# Patient Record
Sex: Female | Born: 1940 | Race: White | Hispanic: No | Marital: Married | Smoking: Never smoker
Health system: Southern US, Community
[De-identification: ages and names within clinical notes are randomized; demographics above are authoritative.]

## PROBLEM LIST (undated history)

## (undated) DIAGNOSIS — G8929 Other chronic pain: Secondary | ICD-10-CM

## (undated) DIAGNOSIS — M549 Dorsalgia, unspecified: Secondary | ICD-10-CM

## (undated) DIAGNOSIS — E119 Type 2 diabetes mellitus without complications: Secondary | ICD-10-CM

## (undated) DIAGNOSIS — D649 Anemia, unspecified: Secondary | ICD-10-CM

## (undated) DIAGNOSIS — R569 Unspecified convulsions: Secondary | ICD-10-CM

## (undated) DIAGNOSIS — E7800 Pure hypercholesterolemia, unspecified: Secondary | ICD-10-CM

## (undated) DIAGNOSIS — I10 Essential (primary) hypertension: Secondary | ICD-10-CM

## (undated) DIAGNOSIS — M1990 Unspecified osteoarthritis, unspecified site: Secondary | ICD-10-CM

## (undated) DIAGNOSIS — K219 Gastro-esophageal reflux disease without esophagitis: Secondary | ICD-10-CM

## (undated) DIAGNOSIS — N183 Chronic kidney disease, stage 3 unspecified: Secondary | ICD-10-CM

## (undated) DIAGNOSIS — F32A Depression, unspecified: Secondary | ICD-10-CM

## (undated) DIAGNOSIS — E785 Hyperlipidemia, unspecified: Secondary | ICD-10-CM

## (undated) DIAGNOSIS — F329 Major depressive disorder, single episode, unspecified: Secondary | ICD-10-CM

## (undated) DIAGNOSIS — I639 Cerebral infarction, unspecified: Secondary | ICD-10-CM

## (undated) HISTORY — DX: Essential (primary) hypertension: I10

## (undated) HISTORY — DX: Unspecified osteoarthritis, unspecified site: M19.90

## (undated) HISTORY — DX: Hyperlipidemia, unspecified: E78.5

## (undated) HISTORY — DX: Cerebral infarction, unspecified: I63.9

## (undated) HISTORY — PX: COLONOSCOPY: SHX174

## (undated) HISTORY — DX: Unspecified convulsions: R56.9

## (undated) HISTORY — PX: DILATION AND CURETTAGE OF UTERUS: SHX78

## (undated) HISTORY — PX: CATARACT EXTRACTION W/ INTRAOCULAR LENS IMPLANT: SHX1309

---

## 1998-12-05 ENCOUNTER — Ambulatory Visit (HOSPITAL_COMMUNITY): Admission: RE | Admit: 1998-12-05 | Discharge: 1998-12-05 | Payer: Self-pay | Admitting: Family Medicine

## 2000-04-03 ENCOUNTER — Other Ambulatory Visit: Admission: RE | Admit: 2000-04-03 | Discharge: 2000-04-03 | Payer: Self-pay | Admitting: *Deleted

## 2000-06-26 ENCOUNTER — Encounter: Admission: RE | Admit: 2000-06-26 | Discharge: 2000-06-26 | Payer: Self-pay | Admitting: Family Medicine

## 2000-06-26 ENCOUNTER — Encounter: Payer: Self-pay | Admitting: Family Medicine

## 2001-02-26 ENCOUNTER — Encounter: Admission: RE | Admit: 2001-02-26 | Discharge: 2001-02-26 | Payer: Self-pay | Admitting: Family Medicine

## 2001-02-26 ENCOUNTER — Encounter: Payer: Self-pay | Admitting: Family Medicine

## 2001-09-13 ENCOUNTER — Encounter: Payer: Self-pay | Admitting: Family Medicine

## 2001-09-13 ENCOUNTER — Encounter: Admission: RE | Admit: 2001-09-13 | Discharge: 2001-09-13 | Payer: Self-pay | Admitting: Family Medicine

## 2002-01-14 ENCOUNTER — Encounter: Admission: RE | Admit: 2002-01-14 | Discharge: 2002-01-14 | Payer: Self-pay | Admitting: Family Medicine

## 2002-01-14 ENCOUNTER — Encounter: Payer: Self-pay | Admitting: Family Medicine

## 2002-07-28 ENCOUNTER — Other Ambulatory Visit: Admission: RE | Admit: 2002-07-28 | Discharge: 2002-07-28 | Payer: Self-pay | Admitting: Family Medicine

## 2002-12-09 ENCOUNTER — Encounter: Admission: RE | Admit: 2002-12-09 | Discharge: 2002-12-09 | Payer: Self-pay | Admitting: Family Medicine

## 2002-12-09 ENCOUNTER — Encounter: Payer: Self-pay | Admitting: Family Medicine

## 2003-11-20 ENCOUNTER — Other Ambulatory Visit: Admission: RE | Admit: 2003-11-20 | Discharge: 2003-11-20 | Payer: Self-pay | Admitting: Family Medicine

## 2004-01-12 ENCOUNTER — Encounter: Admission: RE | Admit: 2004-01-12 | Discharge: 2004-01-12 | Payer: Self-pay | Admitting: Family Medicine

## 2004-12-26 ENCOUNTER — Other Ambulatory Visit: Admission: RE | Admit: 2004-12-26 | Discharge: 2004-12-26 | Payer: Self-pay | Admitting: Family Medicine

## 2005-02-06 ENCOUNTER — Encounter: Admission: RE | Admit: 2005-02-06 | Discharge: 2005-02-06 | Payer: Self-pay | Admitting: Family Medicine

## 2006-02-12 ENCOUNTER — Encounter: Admission: RE | Admit: 2006-02-12 | Discharge: 2006-02-12 | Payer: Self-pay | Admitting: Family Medicine

## 2006-03-10 ENCOUNTER — Encounter: Admission: RE | Admit: 2006-03-10 | Discharge: 2006-03-10 | Payer: Self-pay | Admitting: Family Medicine

## 2006-03-24 ENCOUNTER — Other Ambulatory Visit: Admission: RE | Admit: 2006-03-24 | Discharge: 2006-03-24 | Payer: Self-pay | Admitting: Family Medicine

## 2007-04-16 ENCOUNTER — Encounter: Admission: RE | Admit: 2007-04-16 | Discharge: 2007-04-16 | Payer: Self-pay | Admitting: Family Medicine

## 2007-05-06 ENCOUNTER — Encounter: Admission: RE | Admit: 2007-05-06 | Discharge: 2007-05-06 | Payer: Self-pay | Admitting: Family Medicine

## 2007-05-11 ENCOUNTER — Other Ambulatory Visit: Admission: RE | Admit: 2007-05-11 | Discharge: 2007-05-11 | Payer: Self-pay | Admitting: Family Medicine

## 2007-05-13 ENCOUNTER — Encounter: Admission: RE | Admit: 2007-05-13 | Discharge: 2007-05-13 | Payer: Self-pay | Admitting: Family Medicine

## 2007-06-11 ENCOUNTER — Ambulatory Visit: Payer: Self-pay | Admitting: Internal Medicine

## 2007-07-06 ENCOUNTER — Ambulatory Visit: Payer: Self-pay | Admitting: Internal Medicine

## 2008-01-11 ENCOUNTER — Encounter: Admission: RE | Admit: 2008-01-11 | Discharge: 2008-01-11 | Payer: Self-pay | Admitting: Family Medicine

## 2008-05-08 ENCOUNTER — Encounter: Admission: RE | Admit: 2008-05-08 | Discharge: 2008-05-08 | Payer: Self-pay | Admitting: Family Medicine

## 2008-05-11 ENCOUNTER — Other Ambulatory Visit: Admission: RE | Admit: 2008-05-11 | Discharge: 2008-05-11 | Payer: Self-pay | Admitting: Family Medicine

## 2010-01-25 ENCOUNTER — Encounter: Admission: RE | Admit: 2010-01-25 | Discharge: 2010-01-25 | Payer: Self-pay | Admitting: Family Medicine

## 2010-11-24 ENCOUNTER — Encounter: Payer: Self-pay | Admitting: Family Medicine

## 2011-03-03 ENCOUNTER — Other Ambulatory Visit: Payer: Self-pay | Admitting: Family Medicine

## 2011-03-03 DIAGNOSIS — Z1231 Encounter for screening mammogram for malignant neoplasm of breast: Secondary | ICD-10-CM

## 2011-03-11 ENCOUNTER — Ambulatory Visit
Admission: RE | Admit: 2011-03-11 | Discharge: 2011-03-11 | Disposition: A | Discharge status: Home / Self Care | Payer: Federal, State, Local not specified - PPO | Source: Ambulatory Visit | Attending: Family Medicine | Admitting: Family Medicine

## 2011-03-11 DIAGNOSIS — Z1231 Encounter for screening mammogram for malignant neoplasm of breast: Secondary | ICD-10-CM

## 2011-03-12 ENCOUNTER — Other Ambulatory Visit: Payer: Self-pay | Admitting: Family Medicine

## 2011-03-12 DIAGNOSIS — R928 Other abnormal and inconclusive findings on diagnostic imaging of breast: Secondary | ICD-10-CM

## 2011-03-24 ENCOUNTER — Ambulatory Visit
Admission: RE | Admit: 2011-03-24 | Discharge: 2011-03-24 | Disposition: A | Discharge status: Home / Self Care | Payer: Federal, State, Local not specified - PPO | Source: Ambulatory Visit | Attending: Family Medicine | Admitting: Family Medicine

## 2011-03-24 DIAGNOSIS — R928 Other abnormal and inconclusive findings on diagnostic imaging of breast: Secondary | ICD-10-CM

## 2011-09-02 ENCOUNTER — Other Ambulatory Visit: Payer: Self-pay | Admitting: Family Medicine

## 2011-09-02 DIAGNOSIS — N6489 Other specified disorders of breast: Secondary | ICD-10-CM

## 2011-09-15 ENCOUNTER — Inpatient Hospital Stay: Admission: RE | Admit: 2011-09-15 | Payer: Federal, State, Local not specified - PPO | Source: Ambulatory Visit

## 2011-09-26 ENCOUNTER — Ambulatory Visit
Admission: RE | Admit: 2011-09-26 | Discharge: 2011-09-26 | Disposition: A | Discharge status: Home / Self Care | Payer: Federal, State, Local not specified - PPO | Source: Ambulatory Visit | Attending: Family Medicine | Admitting: Family Medicine

## 2011-09-26 DIAGNOSIS — N6489 Other specified disorders of breast: Secondary | ICD-10-CM

## 2011-11-04 DIAGNOSIS — I639 Cerebral infarction, unspecified: Secondary | ICD-10-CM

## 2011-11-04 HISTORY — DX: Cerebral infarction, unspecified: I63.9

## 2012-03-30 ENCOUNTER — Other Ambulatory Visit: Payer: Self-pay | Admitting: Family Medicine

## 2012-03-30 DIAGNOSIS — Z1231 Encounter for screening mammogram for malignant neoplasm of breast: Secondary | ICD-10-CM

## 2012-04-19 ENCOUNTER — Ambulatory Visit
Admission: RE | Admit: 2012-04-19 | Discharge: 2012-04-19 | Disposition: A | Discharge status: Home / Self Care | Payer: Federal, State, Local not specified - PPO | Source: Ambulatory Visit | Attending: Family Medicine | Admitting: Family Medicine

## 2012-04-19 DIAGNOSIS — Z1231 Encounter for screening mammogram for malignant neoplasm of breast: Secondary | ICD-10-CM

## 2012-07-26 ENCOUNTER — Encounter (HOSPITAL_COMMUNITY): Payer: Self-pay | Admitting: Emergency Medicine

## 2012-07-26 ENCOUNTER — Emergency Department (HOSPITAL_COMMUNITY): Payer: Medicare Other

## 2012-07-26 ENCOUNTER — Observation Stay (HOSPITAL_COMMUNITY)
Admission: EM | Admit: 2012-07-26 | Discharge: 2012-07-28 | DRG: 639 | Disposition: A | Discharge status: Home / Self Care | Payer: Medicare Other | Attending: Internal Medicine | Admitting: Internal Medicine

## 2012-07-26 DIAGNOSIS — E161 Other hypoglycemia: Secondary | ICD-10-CM

## 2012-07-26 DIAGNOSIS — E118 Type 2 diabetes mellitus with unspecified complications: Secondary | ICD-10-CM | POA: Diagnosis present

## 2012-07-26 DIAGNOSIS — T383X5A Adverse effect of insulin and oral hypoglycemic [antidiabetic] drugs, initial encounter: Secondary | ICD-10-CM | POA: Diagnosis present

## 2012-07-26 DIAGNOSIS — K219 Gastro-esophageal reflux disease without esophagitis: Secondary | ICD-10-CM | POA: Diagnosis present

## 2012-07-26 DIAGNOSIS — E119 Type 2 diabetes mellitus without complications: Secondary | ICD-10-CM

## 2012-07-26 DIAGNOSIS — Z794 Long term (current) use of insulin: Secondary | ICD-10-CM | POA: Insufficient documentation

## 2012-07-26 DIAGNOSIS — E876 Hypokalemia: Secondary | ICD-10-CM | POA: Diagnosis present

## 2012-07-26 DIAGNOSIS — R55 Syncope and collapse: Secondary | ICD-10-CM | POA: Insufficient documentation

## 2012-07-26 DIAGNOSIS — E1169 Type 2 diabetes mellitus with other specified complication: Principal | ICD-10-CM | POA: Insufficient documentation

## 2012-07-26 DIAGNOSIS — E160 Drug-induced hypoglycemia without coma: Secondary | ICD-10-CM | POA: Diagnosis present

## 2012-07-26 DIAGNOSIS — Y92009 Unspecified place in unspecified non-institutional (private) residence as the place of occurrence of the external cause: Secondary | ICD-10-CM | POA: Insufficient documentation

## 2012-07-26 HISTORY — DX: Gastro-esophageal reflux disease without esophagitis: K21.9

## 2012-07-26 LAB — GLUCOSE, CAPILLARY
Glucose-Capillary: 180 mg/dL — ABNORMAL HIGH (ref 70–99)
Glucose-Capillary: 207 mg/dL — ABNORMAL HIGH (ref 70–99)
Glucose-Capillary: 228 mg/dL — ABNORMAL HIGH (ref 70–99)
Glucose-Capillary: 215 mg/dL — ABNORMAL HIGH (ref 70–99)
Glucose-Capillary: 200 mg/dL — ABNORMAL HIGH (ref 70–99)
Glucose-Capillary: 218 mg/dL — ABNORMAL HIGH (ref 70–99)

## 2012-07-26 LAB — TROPONIN I: Troponin I: <0.3 ng/mL (ref <0.30)

## 2012-07-26 LAB — CBC
MCH: 31.4 pg (ref 26.0–34.0)
Platelets: 259 10*3/uL (ref 150–400)
WBC: 11.2 10*3/uL — ABNORMAL HIGH (ref 4.0–10.5)

## 2012-07-26 LAB — URINALYSIS, ROUTINE W REFLEX MICROSCOPIC
Bilirubin Urine: NEGATIVE
Ketones, ur: NEGATIVE mg/dL
Leukocytes, UA: NEGATIVE
Nitrite: NEGATIVE
Protein, ur: NEGATIVE mg/dL
Urobilinogen, UA: 0.2 mg/dL (ref 0.0–1.0)
pH: 7 (ref 5.0–8.0)

## 2012-07-26 LAB — SODIUM, URINE, RANDOM: Sodium, Ur: 102 mEq/L

## 2012-07-26 LAB — COMPREHENSIVE METABOLIC PANEL
AST: 35 U/L (ref 0–37)
CO2: 28 mEq/L (ref 19–32)
Calcium: 9.4 mg/dL (ref 8.4–10.5)
GFR calc non Af Amer: 43 mL/min — ABNORMAL LOW (ref >90)
Glucose, Bld: 35 mg/dL — CL (ref 70–99)
Total Protein: 7.2 g/dL (ref 6.0–8.3)

## 2012-07-26 LAB — MAGNESIUM: Magnesium: 1.4 mg/dL — ABNORMAL LOW (ref 1.5–2.5)

## 2012-07-26 LAB — HEMOGLOBIN A1C: Mean Plasma Glucose: 163 mg/dL — ABNORMAL HIGH (ref <117)

## 2012-07-26 LAB — CREATININE, URINE, RANDOM: Creatinine, Urine: 14.52 mg/dL

## 2012-07-26 LAB — URINE CULTURE

## 2012-07-26 MED ORDER — DEXTROSE 50 % IV SOLN
INTRAVENOUS | Status: AC
  Filled 2012-07-26: qty 50

## 2012-07-26 MED ORDER — POTASSIUM CHLORIDE CRYS ER 20 MEQ PO TBCR
40.0000 meq | EXTENDED_RELEASE_TABLET | Freq: Once | ORAL | Status: AC
  Administered 2012-07-26: 40 meq via ORAL
  Filled 2012-07-26: qty 2

## 2012-07-26 MED ORDER — GLUCAGON HCL (RDNA) 1 MG IJ SOLR
1.0000 mg | Freq: Once | INTRAMUSCULAR | Status: AC
  Administered 2012-07-26: 1 mg via INTRAVENOUS
  Filled 2012-07-26: qty 1

## 2012-07-26 MED ORDER — METOPROLOL TARTRATE 25 MG PO TABS
25.0000 mg | ORAL_TABLET | Freq: Two times a day (BID) | ORAL | Status: DC
  Administered 2012-07-26 – 2012-07-28 (×5): 25 mg via ORAL
  Filled 2012-07-26 (×6): qty 1

## 2012-07-26 MED ORDER — INSULIN GLARGINE 100 UNIT/ML ~~LOC~~ SOLN
12.0000 [IU] | Freq: Every day | SUBCUTANEOUS | Status: DC
  Administered 2012-07-26 – 2012-07-28 (×3): 12 [IU] via SUBCUTANEOUS

## 2012-07-26 MED ORDER — INSULIN ASPART 100 UNIT/ML ~~LOC~~ SOLN
0.0000 [IU] | SUBCUTANEOUS | Status: DC
  Administered 2012-07-26: 2 [IU] via SUBCUTANEOUS

## 2012-07-26 MED ORDER — SODIUM CHLORIDE 0.9 % IV SOLN
INTRAVENOUS | Status: DC
  Administered 2012-07-26: 03:00:00 via INTRAVENOUS

## 2012-07-26 MED ORDER — SODIUM CHLORIDE 0.9 % IV SOLN
INTRAVENOUS | Status: DC
  Administered 2012-07-26 – 2012-07-28 (×5): via INTRAVENOUS

## 2012-07-26 MED ORDER — DEXTROSE 50 % IV SOLN
1.0000 | Freq: Once | INTRAVENOUS | Status: AC
  Administered 2012-07-26: 50 mL via INTRAVENOUS

## 2012-07-26 MED ORDER — ADULT MULTIVITAMIN W/MINERALS CH
1.0000 | ORAL_TABLET | Freq: Every day | ORAL | Status: DC
  Administered 2012-07-26 – 2012-07-28 (×4): 1 via ORAL
  Filled 2012-07-26 (×3): qty 1

## 2012-07-26 MED ORDER — ALBUTEROL SULFATE (5 MG/ML) 0.5% IN NEBU: 2.5000 mg | INHALATION_SOLUTION | RESPIRATORY_TRACT | Status: DC | PRN

## 2012-07-26 MED ORDER — VITAMIN C 500 MG PO TABS
500.0000 mg | ORAL_TABLET | Freq: Every day | ORAL | Status: DC
  Administered 2012-07-26 – 2012-07-28 (×3): 500 mg via ORAL
  Filled 2012-07-26 (×3): qty 1

## 2012-07-26 MED ORDER — GLUCAGON HCL (RDNA) 1 MG IJ SOLR: 5.0000 mg | Freq: Once | INTRAVENOUS | Status: DC

## 2012-07-26 MED ORDER — ONDANSETRON HCL 4 MG PO TABS: 4.0000 mg | ORAL_TABLET | Freq: Four times a day (QID) | ORAL | Status: DC | PRN

## 2012-07-26 MED ORDER — INSULIN ASPART 100 UNIT/ML ~~LOC~~ SOLN
0.0000 [IU] | SUBCUTANEOUS | Status: DC
  Administered 2012-07-26: 3 [IU] via SUBCUTANEOUS
  Administered 2012-07-27: 5 [IU] via SUBCUTANEOUS
  Administered 2012-07-27: 3 [IU] via SUBCUTANEOUS
  Administered 2012-07-27: 5 [IU] via SUBCUTANEOUS

## 2012-07-26 MED ORDER — POLYETHYLENE GLYCOL 3350 17 G PO PACK: 17.0000 g | PACK | Freq: Every day | ORAL | Status: DC | PRN

## 2012-07-26 MED ORDER — HYDROCODONE-ACETAMINOPHEN 5-325 MG PO TABS: 1.0000 | ORAL_TABLET | ORAL | Status: DC | PRN

## 2012-07-26 MED ORDER — ONDANSETRON HCL 4 MG/2ML IJ SOLN: 4.0000 mg | Freq: Four times a day (QID) | INTRAMUSCULAR | Status: DC | PRN

## 2012-07-26 MED ORDER — GUAIFENESIN-DM 100-10 MG/5ML PO SYRP
5.0000 mL | ORAL_SOLUTION | ORAL | Status: DC | PRN
  Filled 2012-07-26: qty 5

## 2012-07-26 MED ORDER — PANTOPRAZOLE SODIUM 20 MG PO TBEC
20.0000 mg | DELAYED_RELEASE_TABLET | Freq: Every day | ORAL | Status: DC
  Administered 2012-07-26 – 2012-07-28 (×3): 20 mg via ORAL
  Filled 2012-07-26 (×3): qty 1

## 2012-07-26 MED ORDER — KCL IN DEXTROSE-NACL 20-5-0.45 MEQ/L-%-% IV SOLN
Freq: Once | INTRAVENOUS | Status: DC
  Filled 2012-07-26: qty 1000

## 2012-07-26 MED ORDER — KCL IN DEXTROSE-NACL 10-5-0.45 MEQ/L-%-% IV SOLN
INTRAVENOUS | Status: DC
  Administered 2012-07-26: 05:00:00 via INTRAVENOUS
  Filled 2012-07-26: qty 1000

## 2012-07-26 NOTE — ED Notes (Signed)
Pt was found with husband outside of ambulance bay.  Pt's husband stated, "She just hasn't been making much sense."

## 2012-07-26 NOTE — H&P (Signed)
[facility]                                                                                    Patient Demographics  [name] [name], is a 71 y.o. female  CSN: [id number]  MRN: [id number]  DOB - 03-29-1941  Admit Date - 07/26/2012  Outpatient Primary MD for the patient is No primary provider on file.   With History of -  Past Medical History  Diagnosis Date  . Diabetes mellitus   . GERD (gastroesophageal reflux disease)       History reviewed. No pertinent past surgical history.  in for   Chief Complaint  Patient presents with  . Hypoglycemia     HPI  [name] [name]  is a 71 y.o. female, history of type 2 diabetes mellitus who is on Glucophage, Amaryl and insulin, who had recently been placed on higher doses of insulin which was doubled about 2 weeks ago by her primary care physician, comes in with 2 week history of on and off hypoglycemia, making her weak and confused, she was brought in today by her husband when she was talking senseless according to the husband, in the ER she was noted to have a glucose of 35, was called to admit the patient for hypoglycemia. Patient now has received an amp of D50, she is currently on a D5 drip with blood sugar 180, currently she is back to her baseline according to her family members with no subjective complaints whatsoever.    Review of Systems  currently negative review of systems  In addition to the HPI above,  No Fever-chills, No Headache, No changes with Vision or hearing, No problems swallowing food or Liquids, No Chest pain, Cough or Shortness of Breath, No Abdominal pain, No Nausea or Vommitting, Bowel movements are regular, No Blood in stool or Urine, No dysuria, No new skin rashes or bruises, No new joints pains-aches,  No new weakness, tingling, numbness in any extremity, No recent weight gain or loss, No polyuria, polydypsia or polyphagia, No significant Mental Stressors.  A full 10 point Review of  Systems was done, except as stated above, all other Review of Systems were negative.   Social History History  Substance Use Topics  . Smoking status: Never Smoker   . Smokeless tobacco: Not on file  . Alcohol Use: No     Family History Positive for diabetes mellitus type 2 in both parents  Prior to Admission medications   Medication Sig Start Date End Date Taking? Authorizing Provider  glimepiride (AMARYL) 4 MG tablet Take 4 mg by mouth daily before breakfast.   Yes Historical Provider, MD  insulin detemir (LEVEMIR) 100 UNIT/ML injection Inject 25 Units into the skin daily with breakfast.   Yes Historical Provider, MD  Lansoprazole (PREVACID PO) Take 1 tablet by mouth daily.   Yes Historical Provider, MD  metFORMIN (GLUCOPHAGE-XR) 500 MG 24 hr tablet Take 500 mg by mouth daily with breakfast.   Yes Historical Provider, MD  Multiple Vitamin (MULTIVITAMIN WITH MINERALS) TABS Take 1 tablet by mouth daily.   Yes Historical Provider, MD  vitamin C (ASCORBIC ACID) 500 MG tablet Take 500  mg by mouth daily.   Yes Historical Provider, MD    No Known Allergies  Physical Exam  Vitals  Blood pressure 167/74, pulse 97, temperature 97.1 F (36.2 C), temperature source Oral, resp. rate 16, SpO2 98.00%.   1. General frail elderly white female lying in bed in NAD,    2. Normal affect and insight, Not Suicidal or Homicidal, Awake Alert, Oriented X 3.  3. No F.N deficits, ALL C.Nerves Intact, Strength 5/5 all 4 extremities, Sensation intact all 4 extremities, Plantars down going.  4. Ears and Eyes appear Normal, Conjunctivae clear, PERRLA. Moist Oral Mucosa.  5. Supple Neck, No JVD, No cervical lymphadenopathy appriciated, No Carotid Bruits.  6. Symmetrical Chest wall movement, Good air movement bilaterally, CTAB.  7. RRR, No Gallops, Rubs or Murmurs, No Parasternal Heave.  8. Positive Bowel Sounds, Abdomen Soft, Non tender, No organomegaly appriciated,No rebound -guarding or  rigidity.  9.  No Cyanosis, Normal Skin Turgor, No Skin Rash or Bruise.  10. Good muscle tone,  joints appear normal , no effusions, Normal ROM.  11. No Palpable Lymph Nodes in Neck or Axillae    Data Review  CBC  Lab 07/26/12 0214  WBC 11.2*  HGB 12.3  HCT 37.5  PLT 259  MCV 95.7  MCH 31.4  MCHC 32.8  RDW 12.6  LYMPHSABS --  MONOABS --  EOSABS --  BASOSABS --  BANDABS --   ------------------------------------------------------------------------------------------------------------------  Chemistries   Lab 07/26/12 0214  NA 140  K 3.1*  CL 99  CO2 28  GLUCOSE 35*  BUN 42*  CREATININE 1.22*  CALCIUM 9.4  MG --  AST 35  ALT 25  ALKPHOS 185*  BILITOT 0.3   ------------------------------------------------------------------------------------------------------------------ CrCl is unknown because there is no height on file for the current visit. ------------------------------------------------------------------------------------------------------------------ No results found for this basename: TSH,T4TOTAL,FREET3,T3FREE,THYROIDAB in the last 72 hours   Coagulation profile No results found for this basename: INR:5,PROTIME:5 in the last 168 hours ------------------------------------------------------------------------------------------------------------------- No results found for this basename: DDIMER:2 in the last 72 hours -------------------------------------------------------------------------------------------------------------------  Cardiac Enzymes  Lab 07/26/12 0215  CKMB --  TROPONINI <0.30  MYOGLOBIN --   ------------------------------------------------------------------------------------------------------------------ No components found with this basename: POCBNP:3   ---------------------------------------------------------------------------------------------------------------  Urinalysis No results found for this basename: colorurine,  appearanceur, labspec, phurine, glucoseu, hgbur, bilirubinur, ketonesur, proteinur, urobilinogen, nitrite, leukocytesur     Imaging results:   Dg Chest Portable 1 View  07/26/2012  *RADIOLOGY REPORT*  Clinical Data: Syncope.  Hypoglycemia.  PORTABLE CHEST - 1 VIEW  Comparison: None.  Findings:  Cardiopericardial silhouette within normal limits. Mediastinal contours normal. Trachea midline.  No airspace disease or effusion. Monitoring leads are projected over the chest. Bilateral basilar atelectasis is present.  Symmetric bilateral pleural apical scarring.  IMPRESSION: No acute cardiopulmonary disease.  Mild basilar atelectasis.   Original Report Authenticated By: Andreas Newport, M.D.     My personal review of EKG: Rhythm NSR, no acute changes    Assessment & Plan   1. Insulin induced hypoglycemia in a patient with diabetes mellitus type 2 who also takes Glucophage and Amaryl possible mild acute renal insufficiency- patient will be kept on 23 hour observation, she will get a dose of glucagon, will hold all hypoglycemic agents, will check the A1c, every hour CBGs with hypoglycemia protocol as needed, if sugars are stable for the next 5-6 hours she can be discharged home with close outpatient followup with her primary care physician on a reduced insulin and Glucophage dose.  2. GERD no acute issues continue home dose PPI.   3. Hypoglycemia replaced oral and IV, will repeat potassium and magnesium later today.   4. Creatinine slightly elevated-no previous labs in the system, could be mild acute renal failure versus chronic kidney disease, will give gentle IV fluids, will try to avoid Amaryl in the future. Try to obtain labs in the morning from PCP office if creatinine still elevated. Check baseline UA along with urine sodium and creatinine.    DVT Prophylaxis  SCDs  AM Labs Ordered, also please review Full Orders  Family Communication: Admission, patients condition and plan of care  including tests being ordered have been discussed with the patient and husband who indicate understanding and agree with the plan and Code Status.  Code Status full  Disposition Plan: Home  Time spent in minutes : 35  Condition fair  Leroy Sea M.D on 07/26/2012 at 3:45 AM  Between 7am to 7pm - Pager - [phone]  After 7pm go to [email or web address] - password [email or web address]  And look for the night coverage person covering me after hours  [facility] Office  [phone]

## 2012-07-26 NOTE — ED Provider Notes (Signed)
History     CSN: [id number]  Arrival date & time 07/26/12  0210   First MD Initiated Contact with Patient 07/26/12 0217      No chief complaint on file.   (Consider location/radiation/quality/duration/timing/severity/associated sxs/prior treatment) HPI HX per husband - went to bed and making noises and then passed out. BIB husband. Is diabetic.  Took insulin around 8pm  - no new meds but did change from 12.5 to 25 units of insulin at bedtime recently. Also takes oral DM medications. No CP or SOB. No F/C.  No N/V/D. No recent illness. PCP is Aida Puffer, [facility]. No h/o same. Symptoms mod to severe. Presents AMS.  No past medical history on file.  No past surgical history on file.  No family history on file.  History  Substance Use Topics  . Smoking status: Not on file  . Smokeless tobacco: Not on file  . Alcohol Use: Not on file    OB History    No data available      Review of Systems  Constitutional: Negative for fever and chills.  HENT: Negative for neck pain and neck stiffness.   Eyes: Negative for pain.  Respiratory: Negative for shortness of breath.   Cardiovascular: Negative for chest pain.  Gastrointestinal: Negative for abdominal pain.  Genitourinary: Negative for dysuria.  Musculoskeletal: Negative for back pain.  Skin: Negative for rash.  Neurological: Positive for syncope. Negative for headaches.  All other systems reviewed and are negative.    Allergies  Review of patient's allergies indicates not on file.  Home Medications  No current outpatient prescriptions on file.  There were no vitals taken for this visit.  Physical Exam  Constitutional: She appears well-developed and well-nourished.  HENT:  Head: Normocephalic and atraumatic.  Eyes: Conjunctivae normal and EOM are normal. Pupils are equal, round, and reactive to light.  Neck: Trachea normal. Neck supple. No thyromegaly present.  Cardiovascular: Normal rate, regular rhythm, S1  normal, S2 normal and normal pulses.     No systolic murmur is present   No diastolic murmur is present  Pulses:      Radial pulses are 2+ on the right side, and 2+ on the left side.  Pulmonary/Chest: Effort normal and breath sounds normal. She has no wheezes. She has no rhonchi. She has no rales. She exhibits no tenderness.  Abdominal: Soft. Normal appearance and bowel sounds are normal. There is no tenderness. There is no CVA tenderness and negative Murphy's sign.  Musculoskeletal:       BLE:s Calves nontender, no cords or erythema, negative Homans sign  Neurological:       Awake, alert and oriented x 1.   Skin: Skin is warm and dry. She is not diaphoretic. There is pallor.  Psychiatric: Her speech is normal.       Cooperative and appropriate    ED Course  Procedures (including critical care time)  Results for orders placed during the hospital encounter of 07/26/12  GLUCOSE, CAPILLARY      Component Value Range   Glucose-Capillary 33 (*) 70 - 99 mg/dL   Comment 1 Notify RN     Comment 2 Call MD NNP PA CNM    CBC      Component Value Range   WBC 11.2 (*) 4.0 - 10.5 K/uL   RBC 3.92  3.87 - 5.11 MIL/uL   Hemoglobin 12.3  12.0 - 15.0 g/dL   HCT 78.2  95.6 - 21.3 %   MCV 95.7  78.0 - 100.0 fL   MCH 31.4  26.0 - 34.0 pg   MCHC 32.8  30.0 - 36.0 g/dL   RDW 46.9  62.9 - 52.8 %   Platelets 259  150 - 400 K/uL  COMPREHENSIVE METABOLIC PANEL      Component Value Range   Sodium 140  135 - 145 mEq/L   Potassium 3.1 (*) 3.5 - 5.1 mEq/L   Chloride 99  96 - 112 mEq/L   CO2 28  19 - 32 mEq/L   Glucose, Bld 35 (*) 70 - 99 mg/dL   BUN 42 (*) 6 - 23 mg/dL   Creatinine, Ser 4.13 (*) 0.50 - 1.10 mg/dL   Calcium 9.4  8.4 - 24.4 mg/dL   Total Protein 7.2  6.0 - 8.3 g/dL   Albumin 3.7  3.5 - 5.2 g/dL   AST 35  0 - 37 U/L   ALT 25  0 - 35 U/L   Alkaline Phosphatase 185 (*) 39 - 117 U/L   Total Bilirubin 0.3  0.3 - 1.2 mg/dL   GFR calc non Af Amer 43 (*) >90 mL/min   GFR calc Af Amer 50  (*) >90 mL/min  TROPONIN I      Component Value Range   Troponin I <0.30  <0.30 ng/mL  GLUCOSE, CAPILLARY      Component Value Range   Glucose-Capillary 180 (*) 70 - 99 mg/dL  GLUCOSE, CAPILLARY      Component Value Range   Glucose-Capillary 207 (*) 70 - 99 mg/dL   Dg Chest Portable 1 View  07/26/2012  *RADIOLOGY REPORT*  Clinical Data: Syncope.  Hypoglycemia.  PORTABLE CHEST - 1 VIEW  Comparison: None.  Findings:  Cardiopericardial silhouette within normal limits. Mediastinal contours normal. Trachea midline.  No airspace disease or effusion. Monitoring leads are projected over the chest. Bilateral basilar atelectasis is present.  Symmetric bilateral pleural apical scarring.  IMPRESSION: No acute cardiopulmonary disease.  Mild basilar atelectasis.   Original Report Authenticated By: Andreas Newport, M.D.    '   Date: 07/26/2012  Rate: 92   Rhythm: normal sinus rhythm  QRS Axis: normal  Intervals: normal  ST/T Wave abnormalities: nonspecific ST changes  Conduction Disutrbances:none  Narrative Interpretation: no STEMI, QTc 490  Old EKG Reviewed: none available  D50 given. Labs drawn. Recheck blood sugar still low. PO meal provided. IVFs initiated D5 1/2 NS.   MED c/s obtained for still hypoglycemic after D50 and meal - glucagon provided and evaluated by Dr Ellouise Newer who agrees to admit.    MDM   VS and nursing notes reviewed. ECG. CXR. Labs. IVFs and medications as above. MED admit.        Sunnie Nielsen, MD 07/26/12 [phone]

## 2012-07-26 NOTE — Progress Notes (Signed)
   Patient seen earlier today by my colleague Dr. Thedore Mins.  Patient seen and examined, and a base reviewed.  Admitted for significant hypoglycemia, she is on Amaryl 4 mg every morning, Levemir 25 units every morning and extended-release metformin 500 mg every morning.  And I will restart her Lantus at lower dose, hold metformin and Amaryl.   Clint Lipps Pager: [phone] 07/26/2012, 12:16 PM

## 2012-07-26 NOTE — Plan of Care (Signed)
Problem: Phase I Progression Outcomes Goal: Initial discharge plan identified Outcome: Completed/Met Date Met:  07/26/12 Return home with spouse when medically cleared.

## 2012-07-26 NOTE — Progress Notes (Addendum)
Patient arrived from ED via stretcher. She is from home with his husband.  She is alert and oriented times four. HOH. Ambulatory with assist.  Skin is intact but pale.  Husband is at bedside. Patient was oriented to unit, call bell system, and safety measures and acknowledges understanding of each. POC: monitor CBG. Will continue to monitor patient.

## 2012-07-26 NOTE — ED Notes (Signed)
Pt's husband reports pt was changed from taking 12 units of insulin to 25 units about three weeks ago.

## 2012-07-26 NOTE — Progress Notes (Signed)
Utilization review complete 

## 2012-07-27 LAB — CBC
HCT: 35.2 % — ABNORMAL LOW (ref 36.0–46.0)
MCH: 30.8 pg (ref 26.0–34.0)
MCHC: 31.5 g/dL (ref 30.0–36.0)
MCV: 97.8 fL (ref 78.0–100.0)
RDW: 12.7 % (ref 11.5–15.5)

## 2012-07-27 LAB — BASIC METABOLIC PANEL
Calcium: 8.8 mg/dL (ref 8.4–10.5)
Creatinine, Ser: 1.18 mg/dL — ABNORMAL HIGH (ref 0.50–1.10)
GFR calc Af Amer: 52 mL/min — ABNORMAL LOW (ref >90)
GFR calc non Af Amer: 45 mL/min — ABNORMAL LOW (ref >90)
Sodium: 138 mEq/L (ref 135–145)

## 2012-07-27 LAB — GLUCOSE, CAPILLARY
Glucose-Capillary: 51 mg/dL — ABNORMAL LOW (ref 70–99)
Glucose-Capillary: 104 mg/dL — ABNORMAL HIGH (ref 70–99)
Glucose-Capillary: 59 mg/dL — ABNORMAL LOW (ref 70–99)
Glucose-Capillary: 283 mg/dL — ABNORMAL HIGH (ref 70–99)
Glucose-Capillary: 370 mg/dL — ABNORMAL HIGH (ref 70–99)

## 2012-07-27 MED ORDER — INSULIN ASPART 100 UNIT/ML ~~LOC~~ SOLN
5.0000 [IU] | Freq: Three times a day (TID) | SUBCUTANEOUS | Status: DC
  Administered 2012-07-27 – 2012-07-28 (×2): 5 [IU] via SUBCUTANEOUS

## 2012-07-27 NOTE — Progress Notes (Signed)
Hypoglycemic Event  CBG: 59  Treatment: 15 GM carbohydrate snack  Symptoms: None  Follow-up CBG: Time:0440  CBG Result:59  Possible Reasons for Event: Unknown  Comments/MD notified: pt given graham crackers with peanut butter and orange juice; cbg was rechecked and moved up to 104. RN will continue to monitor pt for sxs of hypoglycemia     Florence Antonelli S  Remember to initiate Hypoglycemia Order Set & complete

## 2012-07-27 NOTE — Progress Notes (Signed)
[facility] PROGRESS NOTE  [name] [name] ZOX:[id number] DOB: 1941/05/23 DOA: 07/26/2012 PCP: No primary provider on file.  Assessment/Plan: Principal Problem:  *Hypoglycemia due to insulin Active Problems:  GERD (gastroesophageal reflux disease)  DM type 2 (diabetes mellitus, type 2)  Hypokalemia   Hypoglycemia -Her Lantus cut into half back to 12 units, I discontinued the Amaryl and metformin. -Per patient she had pattern of hyperglycemia during day and hypoglycemia during the night. -Added 5 units of NovoLog to her meals. -I postponed her discharge because of she had hypoglycemic episode in the morning in despite of constant snacking during nighttime.  Diabetes mellitus type 2 -Hemoglobin A1c is 7.3, this is correlate with mean plasma glucose of 163. -Her Levemir insulin was recently increased to 25 units. -Needs careful outpatient followup to avoid significant hypoglycemic episodes. -She is on 12 units of Levemir insulin, 5 units with meals of NovoLog. -Hour likely not discharge on metformin/glimepiride.  Hypokalemia -This is corrected  Renal sufficiency -Not sure if acute or chronic, no significant proteinuria to suggest FGS from DM 2.  Code Status: Full Family Communication: Husband at bedside Disposition Plan: Home in am   Brief narrative: 71 year old with diabetes mellitus type 2, came in to the hospital because of significant hypoglycemia of 33. Her Levemir insulin was doubled recently.  Consultants:  None  Procedures:  None  Antibiotics:  None  HPI/Subjective: Had hypoglycemia this morning, she said she was snacking on lifelong.  Objective: Filed Vitals:   07/26/12 1434 07/26/12 2208 07/27/12 0409 07/27/12 1100  BP: 130/70 131/51 134/69 141/56  Pulse: 96 79 77 86  Temp: 98.7 F (37.1 C) 98.3 F (36.8 C) 98.2 F (36.8 C)   TempSrc: Oral Oral Oral   Resp: 18 20 16    Height:      Weight:   51.7 kg (113 lb 15.7 oz)   SpO2: 95% 98% 99%      Intake/Output Summary (Last 24 hours) at 07/27/12 1304 Last data filed at 07/27/12 0600  Gross per 24 hour  Intake   1995 ml  Output      0 ml  Net   1995 ml   Filed Weights   07/26/12 0531 07/27/12 0409  Weight: 53.7 kg (118 lb 6.2 oz) 51.7 kg (113 lb 15.7 oz)    Exam:  General: Alert and awake, oriented x3, not in any acute distress. HEENT: anicteric sclera, pupils reactive to light and accommodation, EOMI CVS: S1-S2 clear, no murmur rubs or gallops Chest: clear to auscultation bilaterally, no wheezing, rales or rhonchi Abdomen: soft nontender, nondistended, normal bowel sounds, no organomegaly Extremities: no cyanosis, clubbing or edema noted bilaterally Neuro: Cranial nerves II-XII intact, no focal neurological deficits  Data Reviewed: Basic Metabolic Panel:  Lab 07/27/12 8119 07/26/12 0828 07/26/12 0214  NA 138 -- 140  K 5.0 5.2* 3.1*  CL 103 -- 99  CO2 25 -- 28  GLUCOSE 362* -- 35*  BUN 27* -- 42*  CREATININE 1.18* -- 1.22*  CALCIUM 8.8 -- 9.4  MG -- 1.4* --  PHOS -- -- --   Liver Function Tests:  Lab 07/26/12 0214  AST 35  ALT 25  ALKPHOS 185*  BILITOT 0.3  PROT 7.2  ALBUMIN 3.7   No results found for this basename: LIPASE:5,AMYLASE:5 in the last 168 hours No results found for this basename: AMMONIA:5 in the last 168 hours CBC:  Lab 07/27/12 0652 07/26/12 0214  WBC 7.8 11.2*  NEUTROABS -- --  HGB 11.1* 12.3  HCT 35.2* 37.5  MCV 97.8 95.7  PLT 215 259   Cardiac Enzymes:  Lab 07/26/12 0215  CKTOTAL --  CKMB --  CKMBINDEX --  TROPONINI <0.30   BNP (last 3 results) No results found for this basename: PROBNP:3 in the last 8760 hours CBG:  Lab 07/27/12 1148 07/27/12 0736 07/27/12 0500 07/27/12 0440 07/27/12 0423  GLUCAP 279* 283* 104* 59* 51*    Recent Results (from the past 240 hour(s))  URINE CULTURE     Status: Normal   Collection Time   07/26/12  4:29 AM      Component Value Range Status Comment   Specimen Description URINE,  RANDOM   Final    Special Requests NONE   Final    Culture  Setup Time 07/25/2012 05:18   Final    Colony Count NO GROWTH   Final    Culture NO GROWTH   Final    Report Status 07/26/2012 FINAL   Final      Studies: Dg Chest Portable 1 View  07/26/2012  *RADIOLOGY REPORT*  Clinical Data: Syncope.  Hypoglycemia.  PORTABLE CHEST - 1 VIEW  Comparison: None.  Findings:  Cardiopericardial silhouette within normal limits. Mediastinal contours normal. Trachea midline.  No airspace disease or effusion. Monitoring leads are projected over the chest. Bilateral basilar atelectasis is present.  Symmetric bilateral pleural apical scarring.  IMPRESSION: No acute cardiopulmonary disease.  Mild basilar atelectasis.   Original Report Authenticated By: Andreas Newport, M.D.     Scheduled Meds:   . insulin aspart  0-9 Units Subcutaneous Q4H  . insulin aspart  5 Units Subcutaneous TID WC  . insulin glargine  12 Units Subcutaneous Daily  . metoprolol tartrate  25 mg Oral BID  . multivitamin with minerals  1 tablet Oral Daily  . pantoprazole  20 mg Oral Q1200  . vitamin C  500 mg Oral Daily  . DISCONTD: insulin aspart  0-9 Units Subcutaneous Q4H   Continuous Infusions:   . sodium chloride 100 mL/hr at 07/27/12 1111    Principal Problem:  *Hypoglycemia due to insulin Active Problems:  GERD (gastroesophageal reflux disease)  DM type 2 (diabetes mellitus, type 2)  Hypokalemia    Time spent: 35 minutes    [facility]  [facility] Pager [phone]. If 8PM-8AM, please contact night-coverage at [email or web address], password [facility] 07/27/2012, 1:04 PM  LOS: 1 day

## 2012-07-27 NOTE — Progress Notes (Signed)
Hypoglycemic Event  CBG:60  Treatment: 15 GM carbohydrate snack  Symptoms: Pale and None  Follow-up CBG: Time:1830 CBG Result:70  Possible Reasons for Event: Unknown  Comments/MD notified:Elmahi    Bing Quarry  Remember to initiate Hypoglycemia Order Set & complete

## 2012-07-27 NOTE — Progress Notes (Signed)
Hypoglycemic Event  CBG:51  Treatment: 15 GM carbohydrate snack  Symptoms: Pale  Follow-up CBG: Time 1800 CBG Result 60  Possible Reasons for Event: Unknown  Comments/MD notified:Elamahi  Changed order    Bing Quarry  Remember to initiate Hypoglycemia Order Set & complete

## 2012-07-27 NOTE — Progress Notes (Signed)
Hypoglycemic Event  CBG: 45  Treatment: 15 GM carbohydrate snack  Symptoms: None  Follow-up CBG: Time:00440 CBG Result:59   Possible Reasons for Event: Unknown  Comments/MD notified: Pt given some graham crackers with peanut butter and some orange juice and CBG rechecked    Cailey Trigueros S  Remember to initiate Hypoglycemia Order Set & complete

## 2012-07-28 LAB — POCT I-STAT, CHEM 8
BUN: 43 mg/dL — ABNORMAL HIGH (ref 6–23)
HCT: 38 % (ref 36.0–46.0)
Hemoglobin: 12.9 g/dL (ref 12.0–15.0)
Sodium: 141 mEq/L (ref 135–145)
TCO2: 28 mmol/L (ref 0–100)

## 2012-07-28 LAB — BASIC METABOLIC PANEL
BUN: 27 mg/dL — ABNORMAL HIGH (ref 6–23)
Calcium: 9 mg/dL (ref 8.4–10.5)
Chloride: 105 mEq/L (ref 96–112)
Creatinine, Ser: 1.19 mg/dL — ABNORMAL HIGH (ref 0.50–1.10)
GFR calc Af Amer: 52 mL/min — ABNORMAL LOW (ref >90)

## 2012-07-28 LAB — GLUCOSE, CAPILLARY

## 2012-07-28 MED ORDER — METOPROLOL TARTRATE 12.5 MG HALF TABLET: 25.0000 mg | ORAL_TABLET | Freq: Two times a day (BID) | ORAL | Status: DC

## 2012-07-28 MED ORDER — INSULIN ASPART 100 UNIT/ML ~~LOC~~ SOLN: SUBCUTANEOUS | Status: DC

## 2012-07-28 MED ORDER — METFORMIN HCL ER 500 MG PO TB24
500.0000 mg | ORAL_TABLET | Freq: Every day | ORAL | Status: DC
  Filled 2012-07-28: qty 1

## 2012-07-28 MED ORDER — INSULIN ASPART 100 UNIT/ML ~~LOC~~ SOLN: 0.0000 [IU] | Freq: Three times a day (TID) | SUBCUTANEOUS | Status: DC

## 2012-07-28 MED ORDER — LISINOPRIL 5 MG PO TABS: 5.0000 mg | ORAL_TABLET | Freq: Every day | ORAL | Status: DC

## 2012-07-28 MED ORDER — INSULIN DETEMIR 100 UNIT/ML ~~LOC~~ SOLN: 12.0000 [IU] | Freq: Every day | SUBCUTANEOUS | Status: DC

## 2012-07-28 NOTE — Discharge Summary (Signed)
PATIENT DETAILS Name: [name] [name] Age: 71 y.o. Sex: female Date of Birth: 05/12/1941 MRN: [id number]. Admit Date: 07/26/2012 Admitting Physician: Leroy Sea, MD PCP:No primary provider on file.  Recommendations for Outpatient Follow-up:  1.  Close monitoring and optimization of the patient Insulin and oral hypoglycemic regimen  PRIMARY DISCHARGE DIAGNOSIS:  Principal Problem:  *Hypoglycemia due to insulin Active Problems:  GERD (gastroesophageal reflux disease)  DM type 2 (diabetes mellitus, type 2)  Hypokalemia      PAST MEDICAL HISTORY: Past Medical History  Diagnosis Date  . Diabetes mellitus   . GERD (gastroesophageal reflux disease)     DISCHARGE MEDICATIONS:   Medication List     As of 07/28/2012  3:13 PM    STOP taking these medications         glimepiride 4 MG tablet   Commonly known as: AMARYL      TAKE these medications         insulin aspart 100 UNIT/ML injection   Commonly known as: novoLOG   0-9 Units, Subcutaneous, 3 times daily with meals  CBG < 70: implement hypoglycemia protocol, Call MD    CBG 70 - 120: 0 units,  CBG 121 - 150: 1 unit,  CBG 151 - 200: 2 units,  CBG 201 - 250: 3 units,  CBG 251 - 300: 5 units,  CBG 301 - 350: 7 units,  CBG 351 - 400: 9 units,  CBG > 400: call MD      insulin detemir 100 UNIT/ML injection   Commonly known as: LEVEMIR   Inject 12 Units into the skin daily with breakfast.      metFORMIN 500 MG 24 hr tablet   Commonly known as: GLUCOPHAGE-XR   Take 500 mg by mouth daily with breakfast.      metoprolol tartrate 12.5 mg Tabs   Commonly known as: LOPRESSOR   Take 1 tablet (25 mg total) by mouth 2 (two) times daily.      multivitamin with minerals Tabs   Take 1 tablet by mouth daily.      PREVACID PO   Take 1 tablet by mouth daily.      vitamin C 500 MG tablet   Commonly known as: ASCORBIC ACID   Take 500 mg by mouth daily.         BRIEF HPI:  See H&P, Labs, Consult and Test  reports for all details in brief,[name] [name] is a 71 y.o. female, history of type 2 diabetes mellitus who is on Glucophage, Amaryl and insulin, who had recently been placed on higher doses of insulin which was doubled about 2 weeks ago by her primary care physician, comes in with 2 week history of on and off hypoglycemia, making her weak and confused, she was brought in today by her husband when she was talking senseless according to the husband, in the ER she was noted to have a glucose of 35, was called to admit the patient for hypoglycemia.   CONSULTATIONS:   {None PERTINENT RADIOLOGIC STUDIES: Dg Chest Portable 1 View  07/26/2012  *RADIOLOGY REPORT*  Clinical Data: Syncope.  Hypoglycemia.  PORTABLE CHEST - 1 VIEW  Comparison: None.  Findings:  Cardiopericardial silhouette within normal limits. Mediastinal contours normal. Trachea midline.  No airspace disease or effusion. Monitoring leads are projected over the chest. Bilateral basilar atelectasis is present.  Symmetric bilateral pleural apical scarring.  IMPRESSION: No acute cardiopulmonary disease.  Mild basilar atelectasis.   Original Report Authenticated By: Andreas Newport,  M.D.      PERTINENT LAB RESULTS: CBC:  Basename 07/27/12 0652 07/26/12 0249 07/26/12 0214  WBC 7.8 -- 11.2*  HGB 11.1* 12.9 --  HCT 35.2* 38.0 --  PLT 215 -- 259   CMET CMP     Component Value Date/Time   NA 140 07/28/2012 0525   K 4.3 07/28/2012 0525   CL 105 07/28/2012 0525   CO2 29 07/28/2012 0525   GLUCOSE 150* 07/28/2012 0525   BUN 27* 07/28/2012 0525   CREATININE 1.19* 07/28/2012 0525   CALCIUM 9.0 07/28/2012 0525   PROT 7.2 07/26/2012 0214   ALBUMIN 3.7 07/26/2012 0214   AST 35 07/26/2012 0214   ALT 25 07/26/2012 0214   ALKPHOS 185* 07/26/2012 0214   BILITOT 0.3 07/26/2012 0214   GFRNONAA 45* 07/28/2012 0525   GFRAA 52* 07/28/2012 0525    GFR Estimated Creatinine Clearance: 35.1 ml/min (by C-G formula based on Cr of 1.19). No results found for this  basename: LIPASE:2,AMYLASE:2 in the last 72 hours  Basename 07/26/12 0215  CKTOTAL --  CKMB --  CKMBINDEX --  TROPONINI <0.30   No components found with this basename: POCBNP:3 No results found for this basename: DDIMER:2 in the last 72 hours  Basename 07/26/12 0828  HGBA1C 7.3*   No results found for this basename: CHOL:2,HDL:2,LDLCALC:2,TRIG:2,CHOLHDL:2,LDLDIRECT:2 in the last 72 hours  Basename 07/26/12 0828  TSH 1.997  T4TOTAL --  T3FREE --  THYROIDAB --   No results found for this basename: VITAMINB12:2,FOLATE:2,FERRITIN:2,TIBC:2,IRON:2,RETICCTPCT:2 in the last 72 hours Coags: No results found for this basename: PT:2,INR:2 in the last 72 hours Microbiology: Recent Results (from the past 240 hour(s))  URINE CULTURE     Status: Normal   Collection Time   07/26/12  4:29 AM      Component Value Range Status Comment   Specimen Description URINE, RANDOM   Final    Special Requests NONE   Final    Culture  Setup Time 07/25/2012 05:18   Final    Colony Count NO GROWTH   Final    Culture NO GROWTH   Final    Report Status 07/26/2012 FINAL   Final      BRIEF HOSPITAL COURSE:   Principal Problem:  *Hypoglycemia due to insulin -patient seems to have very britttle DM, apparently was on 2 oral agents and per patient CBG's were still high, and PCP placed patient on Levemir. Dose of Levemir was then doubled by PCP as the CBG's still were uncontrolled.  -Per patient her appetite has been stable, and she has not missed major meals. No significant renal failure seen in her labs. Her A1C is 7.3, given the brittle nature of her DM-it is perhaps prudent to tolerate some amount of permissive hyperglycemia as we slowly adjust her Insulin and oral hypoglycemic regimen. For now will discharge patient on just Metformin and 12 units of Lantus and SSI. She indicates to me that she will like to follow up with a Endocrinologist, I have referred the patient to Dr Sharl Ma, I have asked the patient and  husband to call Dr Daune Perch office for appointment, but in the mean time I have asked them to check CBG's with each meal, and atleast one post-prandial CBG, to record them clearly, so she can then show her PCP or endocrinologist when she follows up with them. -CBG's today have been relatively stable, lowest was 72 this am, followed by 211 around noon. -Note-Amaryl has been discontinued. -Note-extensive Hypoglycemic teaching has been done  by me at bedside as well.  Active Problems:  GERD (gastroesophageal reflux disease) -stable -c/w PPI  HTN -started on lopressor this admit -will continue on discharge  TODAY-DAY OF DISCHARGE:  Subjective:   Georgiann Cocker today has no headache,no chest abdominal pain,no new weakness tingling or numbness, feels much better wants to go home today.   Objective:   Blood pressure 132/69, pulse 79, temperature 98.1 F (36.7 C), temperature source Oral, resp. rate 18, height 5\' 5"  (1.651 m), weight 51.3 kg (113 lb 1.5 oz), SpO2 97.00%.  Intake/Output Summary (Last 24 hours) at 07/28/12 1513 Last data filed at 07/28/12 0932  Gross per 24 hour  Intake 2941.67 ml  Output      0 ml  Net 2941.67 ml    Exam Awake Alert, Oriented *3, No new F.N deficits, Normal affect [place].AT,PERRAL Supple Neck,No JVD, No cervical lymphadenopathy appriciated.  Symmetrical Chest wall movement, Good air movement bilaterally, CTAB RRR,No Gallops,Rubs or new Murmurs, No Parasternal Heave +ve B.Sounds, Abd Soft, Non tender, No organomegaly appriciated, No rebound -guarding or rigidity. No Cyanosis, Clubbing or edema, No new Rash or bruise  DISCHARGE CONDITION: Stable  DISPOSITION: HOME  DISCHARGE INSTRUCTIONS:    Activity:  As tolerated with Full fall precautions use walker/cane & assistance as needed  Diet recommendation: Diabetic Diet Heart Healthy diet       Follow-up Information    Follow up with LITTLE,JAMES, MD. Schedule an appointment as soon as possible for  a visit in 5 days.   Contact information:   [address] [place] [address] [phone]       Follow up with KERR,JEFFREY, MD. Schedule an appointment as soon as possible for a visit in 1 week.   Contact information:   [address] [facility] [place] [address] [phone]         Total Time spent on discharge equals 45 minutes.  SignedJeoffrey Massed 07/28/2012 3:13 PM

## 2012-07-28 NOTE — Progress Notes (Signed)
07/28/12 1600 D/C instructions and prescriptions given and explained to pt. and husband; no change in skin; went over meal coverage with pt. several times; pt. accompanied downstairs via w/c for d/ by Reshani, NT.; no problems noted.  Leandrew Koyanagi Aseel [place],RN

## 2012-07-28 NOTE — Progress Notes (Signed)
Pt was given a [place] sandwich for a snack around 2200. Pt ate a half a [place] sandwich and CBG remained stable overnight with a cbg of 120 at 0558

## 2012-07-28 NOTE — Care Management Note (Signed)
    Page 1 of 1   07/28/2012     6:13:08 PM   CARE MANAGEMENT NOTE 07/28/2012  Patient:  [name] [name],[name] [name]   Account Number:  [id number]  Date Initiated:  07/28/2012  Documentation initiated by:  Letha Cape  Subjective/Objective Assessment:   dx hyoglycema  admit- lives with spouse, pta independent.     Action/Plan:   Anticipated DC Date:  07/28/2012   Anticipated DC Plan:  HOME/SELF CARE      DC Planning Services  CM consult      Choice offered to / List presented to:             Status of service:  Completed, signed off Medicare Important Message given?   (If response is "NO", the following Medicare IM given date fields will be blank) Date Medicare IM given:   Date Additional Medicare IM given:    Discharge Disposition:  HOME/SELF CARE  Per UR Regulation:  Reviewed for med. necessity/level of care/duration of stay  If discussed at Long Length of Stay Meetings, dates discussed:    Comments:  07/28/12 18:12 Letha Cape RN, BSN [phone] pt lives with spouse, pta independent, no needs anticiapted.

## 2012-08-14 ENCOUNTER — Ambulatory Visit (INDEPENDENT_AMBULATORY_CARE_PROVIDER_SITE_OTHER): Payer: Federal, State, Local not specified - PPO | Admitting: Family Medicine

## 2012-08-14 VITALS — BP 140/70 | HR 90 | Temp 98.5°F | Resp 18 | Ht 65.0 in | Wt 112.0 lb

## 2012-08-14 DIAGNOSIS — E119 Type 2 diabetes mellitus without complications: Secondary | ICD-10-CM

## 2012-08-14 DIAGNOSIS — R110 Nausea: Secondary | ICD-10-CM

## 2012-08-14 DIAGNOSIS — R51 Headache: Secondary | ICD-10-CM

## 2012-08-14 LAB — COMPREHENSIVE METABOLIC PANEL
CO2: 26 mEq/L (ref 19–32)
Calcium: 9 mg/dL (ref 8.4–10.5)
Chloride: 97 mEq/L (ref 96–112)
Creat: 1.45 mg/dL — ABNORMAL HIGH (ref 0.50–1.10)
Glucose, Bld: 278 mg/dL — ABNORMAL HIGH (ref 70–99)
Total Bilirubin: 0.3 mg/dL (ref 0.3–1.2)
Total Protein: 6.7 g/dL (ref 6.0–8.3)

## 2012-08-14 LAB — POCT CBC
HCT, POC: 43.6 % (ref 37.7–47.9)
Hemoglobin: 13.3 g/dL (ref 12.2–16.2)
MCH, POC: 30.5 pg (ref 27–31.2)
MCV: 100.1 fL — AB (ref 80–97)
MID (cbc): 0.7 (ref 0–0.9)
RBC: 4.36 M/uL (ref 4.04–5.48)
WBC: 11.4 10*3/uL — AB (ref 4.6–10.2)

## 2012-08-14 LAB — GLUCOSE, POCT (MANUAL RESULT ENTRY): POC Glucose: 260 mg/dl — AB (ref 70–99)

## 2012-08-14 LAB — POCT URINALYSIS DIPSTICK
Bilirubin, UA: NEGATIVE
Glucose, UA: NEGATIVE
Ketones, UA: NEGATIVE
Leukocytes, UA: NEGATIVE
Spec Grav, UA: 1.01

## 2012-08-14 LAB — POCT UA - MICROSCOPIC ONLY: Mucus, UA: NEGATIVE

## 2012-08-14 MED ORDER — KETOROLAC TROMETHAMINE 30 MG/ML IJ SOLN
15.0000 mg | Freq: Once | INTRAMUSCULAR | Status: AC
  Administered 2012-08-14: 15 mg via INTRAMUSCULAR

## 2012-08-14 MED ORDER — INSULIN ASPART 100 UNIT/ML ~~LOC~~ SOLN
5.0000 [IU] | Freq: Once | SUBCUTANEOUS | Status: AC
  Administered 2012-08-14: 5 [IU] via SUBCUTANEOUS

## 2012-08-14 NOTE — Progress Notes (Signed)
Subjective:    Patient ID: [name] [name], female    DOB: 04-17-1941, 71 y.o.   MRN: [id number]  HPI  Hasn't fell well for the past 4d w/ severe bitemp HA and just generlyal feeling poor. Hard for pt to tell exactly what hasn't been feeling well -  just off, disoriented, occ vertigo, felt like she was going crazy.  Has been nauseated w/ some regurg but no vomiting. Diarrhea once last wk but nml since. Pushing fluids and making herself eat (since she is DM). Called PCP w/ sxs who advised that she monitor herself over the wkend and call if still having sxs next wk but sxs worsened so she came here.  Sugars have been good for her - about 150 fasting, one morning was 70 this wk but otherwise no lows.  3 wks prev pt was hospitalized for hypoglycemia - cbg was 33 and pt was unconscious - really terrified her and her husband. Her levemir was decreased from 25 to 12 at that time.   HA causing photophobia and phonophobia - not sure if nausea is connected - like the HA pain is causing the nausea or not. This is the worst HA of her life - as she really doesn't normally get HAs. No falls, tried 2 aspirin for the pain this morning w/ some relief.  Past Medical History  Diagnosis Date  . Diabetes mellitus   . GERD (gastroesophageal reflux disease)      Review of Systems  Constitutional: Positive for activity change, appetite change and fatigue. Negative for fever, chills and diaphoresis.  HENT: Negative for ear pain, congestion, sore throat, neck pain, neck stiffness and sinus pressure.   Eyes: Positive for photophobia and visual disturbance.  Respiratory: Negative for cough and shortness of breath.   Cardiovascular: Negative for chest pain.  Gastrointestinal: Positive for nausea. Negative for vomiting, diarrhea and constipation.  Genitourinary: Positive for frequency. Negative for dysuria, urgency, flank pain, decreased urine volume and difficulty urinating.  Musculoskeletal: Negative for myalgias, joint  swelling and arthralgias.  Skin: Negative for rash.  Neurological: Positive for dizziness, light-headedness and headaches.  Psychiatric/Behavioral: Positive for confusion and decreased concentration. Negative for hallucinations.      BP 140/70  Pulse 90  Temp 98.5 F (36.9 C) (Oral)  Resp 18  Ht 5\' 5"  (1.651 m)  Wt 112 lb (50.803 kg)  BMI 18.64 kg/m2  SpO2 94%  Objective:   Physical Exam  Constitutional: She is oriented to person, place, and time. She appears well-developed and well-nourished. No distress.  HENT:  Head: Normocephalic and atraumatic.  Right Ear: External ear normal.  Left Ear: External ear normal.  Nose: Nose normal.  Mouth/Throat: Oropharynx is clear and moist. No oropharyngeal exudate.  Eyes: Conjunctivae normal and EOM are normal. Pupils are equal, round, and reactive to light. Right eye exhibits no discharge. Left eye exhibits no discharge. No scleral icterus.  Neck: Normal range of motion. Neck supple. No thyromegaly present.  Cardiovascular: Normal rate, regular rhythm, normal heart sounds and intact distal pulses.   Pulmonary/Chest: Effort normal and breath sounds normal. No respiratory distress.  Abdominal: Soft. Bowel sounds are normal. She exhibits no distension and no mass. There is no tenderness. There is no rebound and no guarding.  Musculoskeletal: She exhibits no edema and no tenderness.  Lymphadenopathy:    She has no cervical adenopathy.  Neurological: She is alert and oriented to person, place, and time. She has normal reflexes. No cranial nerve deficit. She exhibits normal  muscle tone. She displays a negative Romberg sign. Gait abnormal. Coordination normal.       Shuffling gait - has to hold onto husband  Skin: Skin is warm and dry. She is not diaphoretic. No erythema.  Psychiatric: She has a normal mood and affect. Her behavior is normal.      Results for orders placed in visit on 08/14/12  POCT CBC      Component Value Range   WBC 11.4  (*) 4.6 - 10.2 K/uL   Lymph, poc 2.6  0.6 - 3.4   POC LYMPH PERCENT 23.1  10 - 50 %L   MID (cbc) 0.7  0 - 0.9   POC MID % 6.5  0 - 12 %M   POC Granulocyte 8.0 (*) 2 - 6.9   Granulocyte percent 70.4  37 - 80 %G   RBC 4.36  4.04 - 5.48 M/uL   Hemoglobin 13.3  12.2 - 16.2 g/dL   HCT, POC 16.1  09.6 - 47.9 %   MCV 100.1 (*) 80 - 97 fL   MCH, POC 30.5  27 - 31.2 pg   MCHC 30.5 (*) 31.8 - 35.4 g/dL   RDW, POC 04.5     Platelet Count, POC 323  142 - 424 K/uL   MPV 10.0  0 - 99.8 fL  GLUCOSE, POCT (MANUAL RESULT ENTRY)      Component Value Range   POC Glucose 321 (*) 70 - 99 mg/dl  POCT URINALYSIS DIPSTICK      Component Value Range   Color, UA yellow     Clarity, UA clear     Glucose, UA neg     Bilirubin, UA neg     Ketones, UA neg     Spec Grav, UA 1.010     Blood, UA neg     pH, UA 5.5     Protein, UA neg     Urobilinogen, UA 0.2     Nitrite, UA neg     Leukocytes, UA Negative    POCT UA - MICROSCOPIC ONLY      Component Value Range   WBC, Ur, HPF, POC 0-1     RBC, urine, microscopic 5-6     Bacteria, U Microscopic trace     Mucus, UA neg     Epithelial cells, urine per micros 0-1     Crystals, Ur, HPF, POC neg     Casts, Ur, LPF, POC neg     Yeast, UA neg        Pt given 2 L NS in the office along w/ 5u Novolog insulin. Recheck cbg 260 from 321 Assessment & Plan:  1.  HA, nausea - Pt feeling much better after 2L IVF - Suspect sxs are due to hyperglycemia and dehydration but fortunately no ketones in urine. Check cmp as pt did have recent hosp for hypokalemia w/ some mild AR.  Rec cont to push fluids and call PCP asap for diabetic f/u. I am reluctant to push insulin up to much as cbgs may be acutely elev due to illness and pt's recent hosp for severe hypoglycemia. Rec tylenol for pain - ok to combine w/ occ aspirin but hold off on to many nsaids due to poor renal fxn. RTC in 2d for recheck.

## 2012-08-16 ENCOUNTER — Ambulatory Visit (INDEPENDENT_AMBULATORY_CARE_PROVIDER_SITE_OTHER): Payer: Federal, State, Local not specified - PPO | Admitting: Family Medicine

## 2012-08-16 VITALS — BP 135/74 | HR 74 | Temp 98.1°F | Resp 16 | Ht 65.0 in | Wt 114.0 lb

## 2012-08-16 DIAGNOSIS — R7309 Other abnormal glucose: Secondary | ICD-10-CM

## 2012-08-16 DIAGNOSIS — R269 Unspecified abnormalities of gait and mobility: Secondary | ICD-10-CM

## 2012-08-16 DIAGNOSIS — R51 Headache: Secondary | ICD-10-CM

## 2012-08-16 DIAGNOSIS — R2681 Unsteadiness on feet: Secondary | ICD-10-CM

## 2012-08-16 DIAGNOSIS — R110 Nausea: Secondary | ICD-10-CM

## 2012-08-16 DIAGNOSIS — R739 Hyperglycemia, unspecified: Secondary | ICD-10-CM

## 2012-08-16 DIAGNOSIS — E119 Type 2 diabetes mellitus without complications: Secondary | ICD-10-CM

## 2012-08-16 LAB — POCT URINALYSIS DIPSTICK
Bilirubin, UA: NEGATIVE
Blood, UA: NEGATIVE
Glucose, UA: NEGATIVE
Leukocytes, UA: NEGATIVE
Nitrite, UA: NEGATIVE
Urobilinogen, UA: 0.2

## 2012-08-16 LAB — POCT CBC
HCT, POC: 41.6 % (ref 37.7–47.9)
Hemoglobin: 12.4 g/dL (ref 12.2–16.2)
Lymph, poc: 2.5 (ref 0.6–3.4)
MCH, POC: 29.8 pg (ref 27–31.2)
MCHC: 29.8 g/dL — AB (ref 31.8–35.4)
MCV: 99.9 fL — AB (ref 80–97)
POC MID %: 6.7 %M (ref 0–12)
RBC: 4.16 M/uL (ref 4.04–5.48)
WBC: 9.2 10*3/uL (ref 4.6–10.2)

## 2012-08-16 LAB — POCT UA - MICROSCOPIC ONLY
Casts, Ur, LPF, POC: NEGATIVE
Mucus, UA: NEGATIVE
Yeast, UA: NEGATIVE

## 2012-08-16 LAB — GLUCOSE, POCT (MANUAL RESULT ENTRY): POC Glucose: 246 mg/dl — AB (ref 70–99)

## 2012-08-16 NOTE — Patient Instructions (Addendum)
You have an appointment with [facility] on August 17, 2012.  Your time of scan is at 12:30. Please arrive at 12 noon to fill out paperwork. The location is [address] ([address] [place])

## 2012-08-16 NOTE — Progress Notes (Signed)
I called over to [facility] MRI scan can be done 08/17/12 [address] at 12:00 arrival for 12:30 scan. Patient advised of the appt by [facility].

## 2012-08-16 NOTE — Progress Notes (Signed)
Subjective:    Patient ID: [name] [name], female    DOB: Dec 03, 1940, 71 y.o.   MRN: [id number]  HPI  Overall feeling some better but still shakey, nauseas though she is pushing fluid and drinking a lot.  Sxs seem to intermittently worsen.  HA still persistent but will be dulled some after asa 325 and 2 tylenol which she is doing about every 4 hrs though will only help pain for 1-2 hrs.  Sudden movements make her HA worse - like the car ride over, any bump would be very painful.  CBGs still to high - running 170-180 fasting and then mid 200-300s throughout the day. Take all 4 doses of insulin and eating all 3 meals as directed during recent hosp.  At lunch took 3u novolog w/ cbg 246.  3 wks prev, pt was walking completely fine - she used to walk miles daily for fun. However, ever since her recent hosp she has barely been able to shuffle along.  She states that she is not having any back, hip, probs - denies MSK limitations. States her depth perception is poor and she is worried she is going to walk into a door. She has had several falls. Just last night when she got out of bed to use the restroom, she fell - fell against her arm, did not hit her head. States it was because she felt unbalanced, lightheaded. No prob w/ urine other than nocturia - 3-4x/night and some occ incontinence which she has dealt w/ for a long time. No memory problems. States her mood is fine.  She is not able to do things that she used to enjoy doing (beadwork) due to her vision loss. She has not had an optho eval in about 3 yrs. Past Medical History  Diagnosis Date  . Diabetes mellitus   . GERD (gastroesophageal reflux disease)     Review of Systems  Constitutional: Positive for activity change, appetite change and fatigue. Negative for fever, chills and diaphoresis.  HENT: Negative for hearing loss, ear pain, nosebleeds, congestion, sore throat, facial swelling, rhinorrhea, neck pain, neck stiffness and sinus pressure.     Cardiovascular: Negative for leg swelling.  Gastrointestinal: Positive for nausea. Negative for vomiting, abdominal pain, diarrhea and constipation.  Genitourinary: Positive for urgency and frequency. Negative for dysuria, hematuria, flank pain and difficulty urinating.  Musculoskeletal: Positive for gait problem. Negative for myalgias and joint swelling.  Neurological: Positive for dizziness, tremors, light-headedness and headaches. Negative for syncope, facial asymmetry, speech difficulty, weakness and numbness.  Psychiatric/Behavioral: Negative for behavioral problems, confusion, dysphoric mood, decreased concentration and agitation.      BP 135/74  Pulse 74  Temp 98.1 F (36.7 C) (Oral)  Resp 16  Ht 5\' 5"  (1.651 m)  Wt 114 lb (51.71 kg)  BMI 18.97 kg/m2  Objective:   Physical Exam  Constitutional: She is oriented to person, place, and time. She appears well-developed and well-nourished. She appears listless.  HENT:  Head: Normocephalic and atraumatic.  Right Ear: Tympanic membrane, external ear and ear canal normal.  Left Ear: Tympanic membrane, external ear and ear canal normal.  Nose: Nose normal.  Mouth/Throat: Oropharynx is clear and moist and mucous membranes are normal. No oropharyngeal exudate.  Eyes: Conjunctivae normal and EOM are normal. Pupils are equal, round, and reactive to light. Right eye exhibits no discharge. Left eye exhibits no discharge. No scleral icterus.  Neck: Normal range of motion. Neck supple. No thyromegaly present.  Cardiovascular: Normal rate, regular rhythm,  normal heart sounds and intact distal pulses.   Pulmonary/Chest: Effort normal and breath sounds normal. No respiratory distress.  Abdominal: Soft. Bowel sounds are normal. She exhibits no distension.  Musculoskeletal: She exhibits no edema.  Lymphadenopathy:    She has no cervical adenopathy.  Neurological: She is oriented to person, place, and time. She has normal reflexes. She appears  listless. She displays tremor. No cranial nerve deficit or sensory deficit. She exhibits normal muscle tone. She displays a negative Romberg sign. Gait abnormal.       Neg pronator drift. Subtle right arm resting tremor. Strength 4+/5 and equal bilaterally. Gait slow and shuffling.  Skin: She is not diaphoretic.  Psychiatric: Her speech is normal. Judgment and thought content normal. Her affect is blunt. She is slowed. Cognition and memory are normal. She exhibits a depressed mood.   Results for orders placed in visit on 08/16/12  POCT UA - MICROSCOPIC ONLY      Component Value Range   WBC, Ur, HPF, POC 0-2     RBC, urine, microscopic 0-2     Bacteria, U Microscopic trace     Mucus, UA neg     Epithelial cells, urine per micros 0-2     Crystals, Ur, HPF, POC neg     Casts, Ur, LPF, POC neg     Yeast, UA neg    POCT URINALYSIS DIPSTICK      Component Value Range   Color, UA yellow     Clarity, UA clear     Glucose, UA neg     Bilirubin, UA neg     Ketones, UA neg     Spec Grav, UA 1.010     Blood, UA neg     pH, UA 5.0     Protein, UA neg     Urobilinogen, UA 0.2     Nitrite, UA neg     Leukocytes, UA Negative    GLUCOSE, POCT (MANUAL RESULT ENTRY)      Component Value Range   POC Glucose 246 (*) 70 - 99 mg/dl  POCT CBC      Component Value Range   WBC 9.2  4.6 - 10.2 K/uL   Lymph, poc 2.5  0.6 - 3.4   POC LYMPH PERCENT 26.9  10 - 50 %L   MID (cbc) 0.6  0 - 0.9   POC MID % 6.7  0 - 12 %M   POC Granulocyte 6.1  2 - 6.9   Granulocyte percent 66.4  37 - 80 %G   RBC 4.16  4.04 - 5.48 M/uL   Hemoglobin 12.4  12.2 - 16.2 g/dL   HCT, POC 16.1  09.6 - 47.9 %   MCV 99.9 (*) 80 - 97 fL   MCH, POC 29.8  27 - 31.2 pg   MCHC 29.8 (*) 31.8 - 35.4 g/dL   RDW, POC 04.5     Platelet Count, POC 237  142 - 424 K/uL   MPV 9.9  0 - 99.8 fL  COMPREHENSIVE METABOLIC PANEL      Component Value Range   Sodium 135  135 - 145 mEq/L   Potassium 5.0  3.5 - 5.3 mEq/L   Chloride 101  96 - 112  mEq/L   CO2 25  19 - 32 mEq/L   Glucose, Bld 205 (*) 70 - 99 mg/dL   BUN 33 (*) 6 - 23 mg/dL   Creat 4.09 (*) 8.11 - 1.10 mg/dL   Total Bilirubin 0.3  0.3 - 1.2 mg/dL   Alkaline Phosphatase 105  39 - 117 U/L   AST 24  0 - 37 U/L   ALT 15  0 - 35 U/L   Total Protein 6.2  6.0 - 8.3 g/dL   Albumin 3.7  3.5 - 5.2 g/dL   Calcium 9.0  8.4 - 16.1 mg/dL       Assessment & Plan:  1.  Headache - Will go ahead and obtain head MRI - I am concerned about pt as she does not have a h/o HAs and this has not been relieved by anti-inflammatories.  If MRI comes back neg, I think we should cons neurology referral while we go ahead and try to treat her hyperglycemia and dehydration. 2. Debility - pt's shuffling gait is quite marked and am reluctant to attribute it to her poor vision alone as she maintains the slow shuffling gate when walking down a straight open hallway holding onto her husband.  The MRI will also help evaluate for NPH which is on my differential consider she does have some problems w/ incontinence, loss of gait, and very flat affect - appears quite depressed though she denies it.  Since pt attributes her gait change to poor vision, will refer to optho 3. DM - persistent hyperglycemia. Increase levemir from 12 to 15u qam and cont tid sliding scale novolog.  4. Dehydration - recheck cmp, cont to push fluids. 5. Nausea - cons trial of metformin. Could be due to hyperglycemia or dehydration. Pt has had persistent trace bacteria in urine though otherwise nml so check urine clx and cons trx w/ cipro if worsening.  She did have a mild leukocytosis last visit so recheck.

## 2012-08-17 ENCOUNTER — Ambulatory Visit
Admission: RE | Admit: 2012-08-17 | Discharge: 2012-08-17 | Disposition: A | Discharge status: Home / Self Care | Payer: Federal, State, Local not specified - PPO | Source: Ambulatory Visit | Attending: Family Medicine | Admitting: Family Medicine

## 2012-08-17 ENCOUNTER — Telehealth: Payer: Self-pay | Admitting: Radiology

## 2012-08-17 DIAGNOSIS — R2681 Unsteadiness on feet: Secondary | ICD-10-CM

## 2012-08-17 DIAGNOSIS — R51 Headache: Secondary | ICD-10-CM

## 2012-08-17 LAB — COMPREHENSIVE METABOLIC PANEL
ALT: 15 U/L (ref 0–35)
BUN: 33 mg/dL — ABNORMAL HIGH (ref 6–23)
CO2: 25 mEq/L (ref 19–32)
Calcium: 9 mg/dL (ref 8.4–10.5)
Chloride: 101 mEq/L (ref 96–112)
Creat: 1.34 mg/dL — ABNORMAL HIGH (ref 0.50–1.10)
Glucose, Bld: 205 mg/dL — ABNORMAL HIGH (ref 70–99)
Total Bilirubin: 0.3 mg/dL (ref 0.3–1.2)

## 2012-08-17 MED ORDER — GADOBENATE DIMEGLUMINE 529 MG/ML IV SOLN
10.0000 mL | Freq: Once | INTRAVENOUS | Status: AC | PRN
  Administered 2012-08-17: 10 mL via INTRAVENOUS

## 2012-08-17 NOTE — Telephone Encounter (Signed)
Radiology reccommended the scan with and without contrast, order corrected.

## 2012-08-18 ENCOUNTER — Encounter (HOSPITAL_COMMUNITY): Payer: Self-pay | Admitting: Emergency Medicine

## 2012-08-18 ENCOUNTER — Other Ambulatory Visit: Payer: Self-pay | Admitting: Family Medicine

## 2012-08-18 ENCOUNTER — Emergency Department (HOSPITAL_COMMUNITY): Payer: Medicare Other

## 2012-08-18 ENCOUNTER — Telehealth: Payer: Self-pay | Admitting: Physician Assistant

## 2012-08-18 ENCOUNTER — Inpatient Hospital Stay (HOSPITAL_COMMUNITY)
Admission: EM | Admit: 2012-08-18 | Discharge: 2012-08-27 | DRG: 064 | Disposition: A | Discharge status: Rehabilitation Facility | Payer: Medicare Other | Attending: Internal Medicine | Admitting: Internal Medicine

## 2012-08-18 DIAGNOSIS — D72829 Elevated white blood cell count, unspecified: Secondary | ICD-10-CM | POA: Diagnosis present

## 2012-08-18 DIAGNOSIS — E1169 Type 2 diabetes mellitus with other specified complication: Secondary | ICD-10-CM | POA: Diagnosis present

## 2012-08-18 DIAGNOSIS — H53462 Homonymous bilateral field defects, left side: Secondary | ICD-10-CM | POA: Diagnosis present

## 2012-08-18 DIAGNOSIS — E118 Type 2 diabetes mellitus with unspecified complications: Secondary | ICD-10-CM | POA: Diagnosis present

## 2012-08-18 DIAGNOSIS — G8194 Hemiplegia, unspecified affecting left nondominant side: Secondary | ICD-10-CM

## 2012-08-18 DIAGNOSIS — I498 Other specified cardiac arrhythmias: Secondary | ICD-10-CM | POA: Diagnosis present

## 2012-08-18 DIAGNOSIS — R110 Nausea: Secondary | ICD-10-CM | POA: Diagnosis present

## 2012-08-18 DIAGNOSIS — R29818 Other symptoms and signs involving the nervous system: Secondary | ICD-10-CM | POA: Diagnosis present

## 2012-08-18 DIAGNOSIS — G9340 Encephalopathy, unspecified: Secondary | ICD-10-CM | POA: Diagnosis present

## 2012-08-18 DIAGNOSIS — G40409 Other generalized epilepsy and epileptic syndromes, not intractable, without status epilepticus: Secondary | ICD-10-CM | POA: Diagnosis not present

## 2012-08-18 DIAGNOSIS — M25552 Pain in left hip: Secondary | ICD-10-CM | POA: Diagnosis present

## 2012-08-18 DIAGNOSIS — R1310 Dysphagia, unspecified: Secondary | ICD-10-CM | POA: Diagnosis present

## 2012-08-18 DIAGNOSIS — G40909 Epilepsy, unspecified, not intractable, without status epilepticus: Secondary | ICD-10-CM | POA: Diagnosis present

## 2012-08-18 DIAGNOSIS — I619 Nontraumatic intracerebral hemorrhage, unspecified: Principal | ICD-10-CM | POA: Diagnosis present

## 2012-08-18 DIAGNOSIS — K219 Gastro-esophageal reflux disease without esophagitis: Secondary | ICD-10-CM | POA: Diagnosis present

## 2012-08-18 DIAGNOSIS — R569 Unspecified convulsions: Secondary | ICD-10-CM

## 2012-08-18 DIAGNOSIS — E876 Hypokalemia: Secondary | ICD-10-CM | POA: Diagnosis present

## 2012-08-18 DIAGNOSIS — J690 Pneumonitis due to inhalation of food and vomit: Secondary | ICD-10-CM | POA: Diagnosis present

## 2012-08-18 DIAGNOSIS — R51 Headache: Secondary | ICD-10-CM | POA: Diagnosis present

## 2012-08-18 DIAGNOSIS — N189 Chronic kidney disease, unspecified: Secondary | ICD-10-CM | POA: Diagnosis present

## 2012-08-18 DIAGNOSIS — J9601 Acute respiratory failure with hypoxia: Secondary | ICD-10-CM

## 2012-08-18 DIAGNOSIS — J9600 Acute respiratory failure, unspecified whether with hypoxia or hypercapnia: Secondary | ICD-10-CM | POA: Diagnosis present

## 2012-08-18 DIAGNOSIS — N183 Chronic kidney disease, stage 3 unspecified: Secondary | ICD-10-CM | POA: Diagnosis present

## 2012-08-18 DIAGNOSIS — Z9181 History of falling: Secondary | ICD-10-CM

## 2012-08-18 DIAGNOSIS — E119 Type 2 diabetes mellitus without complications: Secondary | ICD-10-CM

## 2012-08-18 DIAGNOSIS — I129 Hypertensive chronic kidney disease with stage 1 through stage 4 chronic kidney disease, or unspecified chronic kidney disease: Secondary | ICD-10-CM | POA: Diagnosis present

## 2012-08-18 DIAGNOSIS — I10 Essential (primary) hypertension: Secondary | ICD-10-CM | POA: Diagnosis present

## 2012-08-18 DIAGNOSIS — G8190 Hemiplegia, unspecified affecting unspecified side: Secondary | ICD-10-CM | POA: Diagnosis present

## 2012-08-18 DIAGNOSIS — R519 Headache, unspecified: Secondary | ICD-10-CM | POA: Diagnosis present

## 2012-08-18 LAB — CBC WITH DIFFERENTIAL/PLATELET
Basophils Relative: 0 % (ref 0–1)
Eosinophils Absolute: 0 10*3/uL (ref 0.0–0.7)
Eosinophils Relative: 0 % (ref 0–5)
HCT: 38 % (ref 36.0–46.0)
Hemoglobin: 12.6 g/dL (ref 12.0–15.0)
MCH: 31.4 pg (ref 26.0–34.0)
MCHC: 33.2 g/dL (ref 30.0–36.0)
Monocytes Absolute: 0.9 10*3/uL (ref 0.1–1.0)
Monocytes Relative: 8 % (ref 3–12)
RDW: 12.5 % (ref 11.5–15.5)

## 2012-08-18 LAB — URINALYSIS, MICROSCOPIC ONLY
Glucose, UA: 100 mg/dL — AB
Leukocytes, UA: NEGATIVE
Protein, ur: NEGATIVE mg/dL
Specific Gravity, Urine: 1.013 (ref 1.005–1.030)

## 2012-08-18 LAB — BASIC METABOLIC PANEL
BUN: 35 mg/dL — ABNORMAL HIGH (ref 6–23)
CO2: 27 mEq/L (ref 19–32)
Calcium: 9.3 mg/dL (ref 8.4–10.5)
Creatinine, Ser: 1.34 mg/dL — ABNORMAL HIGH (ref 0.50–1.10)
Glucose, Bld: 51 mg/dL — ABNORMAL LOW (ref 70–99)
Sodium: 138 mEq/L (ref 135–145)

## 2012-08-18 LAB — URINE CULTURE
Colony Count: NO GROWTH
Organism ID, Bacteria: NO GROWTH

## 2012-08-18 LAB — GLUCOSE, CAPILLARY

## 2012-08-18 MED ORDER — DEXTROSE-NACL 5-0.9 % IV SOLN
INTRAVENOUS | Status: DC
  Administered 2012-08-19: 01:00:00 via INTRAVENOUS

## 2012-08-18 MED ORDER — ONDANSETRON HCL 4 MG PO TABS: 4.0000 mg | ORAL_TABLET | Freq: Four times a day (QID) | ORAL | Status: DC | PRN

## 2012-08-18 MED ORDER — ONDANSETRON HCL 4 MG/2ML IJ SOLN
4.0000 mg | Freq: Once | INTRAMUSCULAR | Status: AC
  Administered 2012-08-18: 4 mg via INTRAVENOUS
  Filled 2012-08-18: qty 2

## 2012-08-18 MED ORDER — METOPROLOL TARTRATE 12.5 MG HALF TABLET
12.5000 mg | ORAL_TABLET | Freq: Two times a day (BID) | ORAL | Status: DC
  Administered 2012-08-19 (×2): 12.5 mg via ORAL
  Filled 2012-08-18 (×7): qty 1

## 2012-08-18 MED ORDER — DEXTROSE 50 % IV SOLN
1.0000 | Freq: Once | INTRAVENOUS | Status: AC
  Administered 2012-08-18: 50 mL via INTRAVENOUS
  Filled 2012-08-18: qty 50

## 2012-08-18 MED ORDER — SODIUM CHLORIDE 0.9 % IV SOLN
Freq: Once | INTRAVENOUS | Status: AC
  Administered 2012-08-18: 20:00:00 via INTRAVENOUS

## 2012-08-18 MED ORDER — SODIUM CHLORIDE 0.9 % IJ SOLN: 3.0000 mL | Freq: Two times a day (BID) | INTRAMUSCULAR | Status: DC

## 2012-08-18 MED ORDER — INSULIN ASPART 100 UNIT/ML ~~LOC~~ SOLN
0.0000 [IU] | SUBCUTANEOUS | Status: DC
  Administered 2012-08-19 (×2): 2 [IU] via SUBCUTANEOUS

## 2012-08-18 MED ORDER — ONDANSETRON HCL 4 MG/2ML IJ SOLN
4.0000 mg | Freq: Four times a day (QID) | INTRAMUSCULAR | Status: DC | PRN
  Administered 2012-08-25: 4 mg via INTRAVENOUS
  Filled 2012-08-18: qty 2

## 2012-08-18 MED ORDER — FAMOTIDINE 10 MG/ML IV SOLN
10.0000 mg | INTRAVENOUS | Status: DC
  Administered 2012-08-19: 10 mg via INTRAVENOUS
  Filled 2012-08-18 (×2): qty 1

## 2012-08-18 MED ORDER — LABETALOL HCL 5 MG/ML IV SOLN
10.0000 mg | INTRAVENOUS | Status: DC | PRN
  Administered 2012-08-22 – 2012-08-23 (×3): 10 mg via INTRAVENOUS
  Filled 2012-08-18 (×3): qty 4

## 2012-08-18 NOTE — ED Provider Notes (Signed)
Patient moved to CDU awaiting admission for non-traumatic intra-axial hemorrhage.  Patient has been seen by neuro-hospitalist, recommends admission to medicine (step-down bed d/t failure of stroke swallow screen and may need blood pressure control by IV meds).  Dr. Conley Rolls aware and will see patient.  Jimmye Norman, NP 08/19/12 [phone]

## 2012-08-18 NOTE — ED Provider Notes (Signed)
History     CSN: [id number]  Arrival date & time 08/18/12  1607   First MD Initiated Contact with Patient 08/18/12 1653      Chief Complaint  Patient presents with  . Headache     HPI Pt had MRI today for acute onset of headache that started 1 week ago. Results were called to [facility] and pt was told to come to ED to see neurology. Per PA at [facility], MRI showed "right posterior acute and subacute hemorrhage consistent with an event occurring 1 week ago." Pt reports headache at present. Alert and oriented.  Past Medical History  Diagnosis Date  . Diabetes mellitus   . GERD (gastroesophageal reflux disease)     History reviewed. No pertinent past surgical history.  No family history on file.  History  Substance Use Topics  . Smoking status: Never Smoker   . Smokeless tobacco: Not on file  . Alcohol Use: No    OB History    Grav Para Term Preterm Abortions TAB SAB Ect Mult Living                  Review of Systems  All other systems reviewed and are negative.    Allergies  Barbiturates  Home Medications   No current outpatient prescriptions on file.  BP 139/82  Pulse 84  Temp 98.3 F (36.8 C) (Oral)  Resp 10  Ht 5\' 5"  (1.651 m)  Wt 110 lb (49.896 kg)  BMI 18.31 kg/m2  SpO2 97%  Physical Exam  Nursing note and vitals reviewed. Constitutional: She is oriented to person, place, and time. She appears well-developed and well-nourished. No distress.  HENT:  Head: Normocephalic and atraumatic.  Eyes: Pupils are equal, round, and reactive to light.  Neck: Normal range of motion.  Cardiovascular: Normal rate and intact distal pulses.   Pulmonary/Chest: No respiratory distress.  Abdominal: Normal appearance. She exhibits no distension.  Musculoskeletal: Normal range of motion.  Neurological: She is alert and oriented to person, place, and time. No cranial nerve deficit.        Cranial nerves 2-12 grossly intact  with right dominalnt gaze and left sided neglect.  Speech is fluent; uvula elevated with phonation, facial symmetry and tongue midline. DTR are normal bilaterally, cerebella exam is intact, barbinski is negative and strengths are equaled bilaterally.  No sensory loss. Left visual field deficit to confrontation.  She failed her bedside swallowing test.   Skin: Skin is warm and dry. No rash noted. There is pallor.  Psychiatric: She has a normal mood and affect. Her behavior is normal.    ED Course  Procedures (including critical care time)   CRITICAL CARE Performed by: Nelva Nay L   Total critical care time:30 min  Critical care time was exclusive of separately billable procedures and treating other patients.  Critical care was necessary to treat or prevent imminent or life-threatening deterioration.  Critical care was time spent personally by me on the following activities: development of treatment plan with patient and/or surrogate as well as nursing, discussions with consultants, evaluation of patient's response to treatment, examination of patient, obtaining history from patient or surrogate, ordering and performing treatments and interventions, ordering and review of laboratory studies, ordering and review of radiographic studies, pulse oximetry and re-evaluation of patient's condition.  Labs Reviewed  CBC WITH DIFFERENTIAL - Abnormal; Notable for the following:    WBC 11.0 (*)     All other components  within normal limits  BASIC METABOLIC PANEL - Abnormal; Notable for the following:    Glucose, Bld 51 (*)     BUN 35 (*)     Creatinine, Ser 1.34 (*)     GFR calc non Af Amer 39 (*)     GFR calc Af Amer 45 (*)     All other components within normal limits  GLUCOSE, CAPILLARY - Abnormal; Notable for the following:    Glucose-Capillary 214 (*)     All other components within normal limits  URINALYSIS, MICROSCOPIC ONLY - Abnormal; Notable for the following:    Glucose, UA 100  (*)     All other components within normal limits  GLUCOSE, CAPILLARY - Abnormal; Notable for the following:    Glucose-Capillary 165 (*)     All other components within normal limits  GLUCOSE, CAPILLARY - Abnormal; Notable for the following:    Glucose-Capillary 151 (*)     All other components within normal limits  PROTIME-INR  MRSA PCR SCREENING  GLUCOSE, CAPILLARY  GLUCOSE, CAPILLARY  HEMOGLOBIN A1C  TSH   Mr Laqueta Jean Wo Contrast  08/18/2012  *RADIOLOGY REPORT*  Clinical Data: Bilateral temporal headaches and confusion with increased loss of vision for 1 week. History of diabetes.  MRI HEAD WITHOUT AND WITH CONTRAST  Technique:  Multiplanar, multiecho pulse sequences of the brain and surrounding structures were obtained according to standard protocol without and with intravenous contrast  Contrast: 10mL MULTIHANCE GADOBENATE DIMEGLUMINE 529 MG/ML IV SOLN  Comparison: None.  Findings: There is a 13 x 22 x 14 mm right posterior temporo- occipital intra-axial hemorrhage consistent with an event 1 week ago.  There is T2 shortening and T1 shortening admixed within the lesion (deoxyhemoglobin and methemoglobin, respectively).  Mild surrounding edema.  No significant restricted diffusion to suggest acute infarction with reperfusion.  No extra-axial hematoma.  No other parenchymal acute or chronic hemorrhage.  Post infusion, no definite abnormal enhancement above and beyond the intrinsic T1 shortening due to methemoglobin.  There is mild age related atrophy.  Mild chronic microvascular ischemic changes noted in the periventricular and subcortical white matter, nonspecific nature.  Major intracranial vascular structures appear patent.  There is no visible arteriovenous malformation.  No worrisome osseous lesions. Paranasal sinuses, orbits, and mastoids appear clear.  IMPRESSION: 13 x 22 x 14 mm right posterior temporo-occipital intra-axial acute and subacute hemorrhage consistent with an event occurring 1  week ago.  The appearance is nonspecific and could represent hemorrhage is secondary to hypertension, anticoagulation, occult trauma, venous angioma or cavernoma (OCVM), pial AVM, hemorrhagic metastasis, or idiopathic.  Continued surveillance is warranted. Findings discussed with Heather at [facility].   Original Report Authenticated By: Elsie Stain, M.D.    Dg Chest Portable 1 View  08/18/2012  *RADIOLOGY REPORT*  Clinical Data: Pain and confusion.  Diabetes.  PORTABLE CHEST - 1 VIEW  Comparison: 07/26/2012  Findings: 1917 hours. The lungs are clear without focal infiltrate, edema, pneumothorax or pleural effusion. The cardiopericardial silhouette is within normal limits for size. Imaged bony structures of the thorax are intact. Telemetry leads overlie the chest.  IMPRESSION: No acute cardiopulmonary process.   Original Report Authenticated By: ERIC A. MANSELL, M.D.      1. Hemorrhage of brain, nontraumatic   2. DM type 2 (diabetes mellitus, type 2)   3. Dysphagia       MDM          Nelia Shi, MD 08/19/12 [phone]

## 2012-08-18 NOTE — Consult Note (Signed)
Reason for Consult: Parietal hemorrhage Referring Physician: Cecile Sheerer  CC: Confusion  History is obtained from: Patient, husband  HPI: [name] [name] is an 71 y.o. female who was in her normal state of health until 10 days ago at which point she complained of headache and nausea and vomiting. Subsequently, 3 days later her headache got worse and she started becoming confused. For the past 7 days, she has been stable, but not getting any better.  Currently, her husband states that she runs into things on the left, stumbling, and just not seeming like herself. It does not wax and wane. She also may have some trouble with her vision.  Of note, she was told to take aspirin every 6 hours along with Tylenol and has been doing so for the past 2 days. She was not taking regular aspirin prior to her onset of symptoms.  ROS: A COMPLETE ROS was performed and is negative except as noted in the HPI.  Past Medical History  Diagnosis Date  . Diabetes mellitus   . GERD (gastroesophageal reflux disease)     Family History: No history the patient knows of of stroke  Social History: Tob: Denies  Exam: Current vital signs: BP 162/74  Pulse 94  Temp 98.1 F (36.7 C) (Oral)  Resp 18  Ht 5\' 5"  (1.651 m)  Wt 49.896 kg (110 lb)  BMI 18.31 kg/m2  SpO2 99% Vital signs in last 24 hours: Temp:  [98.1 F (36.7 C)-99 F (37.2 C)] 98.1 F (36.7 C) (10/16 1955) Pulse Rate:  [82-96] 94  (10/16 2047) Resp:  [14-22] 18  (10/16 2047) BP: (129-162)/(58-100) 162/74 mmHg (10/16 2047) SpO2:  [97 %-100 %] 99 % (10/16 2047) Weight:  [49.896 kg (110 lb)] 49.896 kg (110 lb) (10/16 1610)  General: In bed, attending to people on the right side CV: Regular rate and rhythm Mental Status: Patient is awake, alert, oriented to person, place, month, but not year. She is able to add 7+5, not give number of quarters in 2.75 She is able to spell world forwards, but not backwards She has a left hemi-neglect Cranial  Nerves: II: She has a left hemianopia. Pupils are equal, round, and reactive to light.  Discs are difficult visual. III,IV, VI: EOMI without ptosis or diploplia.  V,VII: Facial sensation and movement are symmetric, though she does have difficulty moving which appears to be an apraxia VIII: hearing is intact to voice X: Uvula elevates symmetrically XI: Shoulder shrug is symmetric. XII: tongue is midline without atrophy or fasciculations.  Motor: Tone is normal. Bulk is normal. 5/5 strength in right arm and leg, 4/5 strength in left arm and leg. She also appears to have apraxic movement on that side Sensory: She appears to have diminished sensation on the left, and she has a left extinction  Deep Tendon Reflexes: 2+ and symmetric in the biceps and patellae.  Plantars: Toes are downgoing bilaterally.  Cerebellar: FNF intact on right, unable to perform appropriately on left, I suspect due to apraxia  Gait: Did not assess due to patient safety concern   I have reviewed labs in epic and the results pertinent to this consultation are:  BMP with mild elevation in creatinine, CBC with mild leukocytosis   INR normal, platelets normal  I have reviewed the images obtained: MRI brain-right parietal hemorrhage   Impression:  71 year old female with left parietal hemorrhage that has been stable for one week. Is not in a typical location for hypertensive bleed, but it  could be. She also does have some mild problems with memory, and this could be an amyloid bleed.   Recommendations: 1) Maintain SBP < 160 systolic, she has failed her bedside swallow screen, and therefore may be reasonable to move her to a bed where she would be able to receive IV blood pressure control agents if needed 2) stop antiplatelet medications  3) SCDs for prophylaxis 4) Followup MRI and MRA once blood is resorbed would be helpful, but this will be after discharge.    Ritta Slot, MD [facility] [phone]  If 7pm- 7am, please page neurology on call at [phone].

## 2012-08-18 NOTE — ED Notes (Signed)
Neurologist exam consistent with NIH scale completed by this RN. Husband at bedside. Pt husband reports that pt was recently admitted with low BG. Pt reports ongoing HA x 10 days. Husband states she has been increasingly confused over the last week (unable to provide examples) and "bumping into things."

## 2012-08-18 NOTE — ED Notes (Signed)
Family at bedside. 

## 2012-08-18 NOTE — Telephone Encounter (Signed)
Call report taken from Dr. Benard Rink - revealed temporo-occipital intra-axial acute and subacute hemorrhage consistent with an event occurring 1 week ago. Patient had already left [facility] so I called and spoke to her husband Fredrik Cove and gave him results. Instructed him to go to [facility] ER and have her evaluated by neurology there. He understands and is going to take her immediately.  Discussed with Dr. Milus Glazier. Triage nurse notified.

## 2012-08-18 NOTE — ED Notes (Addendum)
MD at bedside. Dr. Amada Jupiter talking to spouse

## 2012-08-18 NOTE — ED Notes (Signed)
Pt had MRI today for acute onset of headache that started 1 week ago.  Results were called to [facility] and pt was told to come to ED to see neurology.  Per PA at [facility], MRI showed "right posterior acute and subacute hemorrhage consistent with an event occurring 1 week ago."  Pt reports headache at present.  Alert and oriented.

## 2012-08-18 NOTE — ED Notes (Signed)
This RN with 2 unsuccessful IV attempts in the right forearm, second RN in to attempt

## 2012-08-19 ENCOUNTER — Observation Stay (HOSPITAL_COMMUNITY): Payer: Medicare Other

## 2012-08-19 DIAGNOSIS — G8194 Hemiplegia, unspecified affecting left nondominant side: Secondary | ICD-10-CM | POA: Diagnosis present

## 2012-08-19 DIAGNOSIS — R110 Nausea: Secondary | ICD-10-CM | POA: Diagnosis present

## 2012-08-19 DIAGNOSIS — R29818 Other symptoms and signs involving the nervous system: Secondary | ICD-10-CM | POA: Diagnosis present

## 2012-08-19 DIAGNOSIS — I619 Nontraumatic intracerebral hemorrhage, unspecified: Secondary | ICD-10-CM | POA: Diagnosis present

## 2012-08-19 DIAGNOSIS — E119 Type 2 diabetes mellitus without complications: Secondary | ICD-10-CM

## 2012-08-19 DIAGNOSIS — G9340 Encephalopathy, unspecified: Secondary | ICD-10-CM

## 2012-08-19 DIAGNOSIS — M25552 Pain in left hip: Secondary | ICD-10-CM | POA: Diagnosis present

## 2012-08-19 DIAGNOSIS — R519 Headache, unspecified: Secondary | ICD-10-CM | POA: Diagnosis present

## 2012-08-19 DIAGNOSIS — D72829 Elevated white blood cell count, unspecified: Secondary | ICD-10-CM | POA: Diagnosis present

## 2012-08-19 DIAGNOSIS — R51 Headache: Secondary | ICD-10-CM | POA: Diagnosis present

## 2012-08-19 DIAGNOSIS — H53462 Homonymous bilateral field defects, left side: Secondary | ICD-10-CM | POA: Diagnosis present

## 2012-08-19 LAB — URINE MICROSCOPIC-ADD ON

## 2012-08-19 LAB — GLUCOSE, CAPILLARY
Glucose-Capillary: 81 mg/dL (ref 70–99)
Glucose-Capillary: 141 mg/dL — ABNORMAL HIGH (ref 70–99)
Glucose-Capillary: 171 mg/dL — ABNORMAL HIGH (ref 70–99)

## 2012-08-19 LAB — BASIC METABOLIC PANEL
BUN: 22 mg/dL (ref 6–23)
Calcium: 9.4 mg/dL (ref 8.4–10.5)
Creatinine, Ser: 1.23 mg/dL — ABNORMAL HIGH (ref 0.50–1.10)
GFR calc non Af Amer: 43 mL/min — ABNORMAL LOW (ref >90)
Glucose, Bld: 184 mg/dL — ABNORMAL HIGH (ref 70–99)
Sodium: 143 mEq/L (ref 135–145)

## 2012-08-19 LAB — CBC
MCH: 32 pg (ref 26.0–34.0)
MCHC: 33.3 g/dL (ref 30.0–36.0)
MCV: 96.2 fL (ref 78.0–100.0)
Platelets: 204 10*3/uL (ref 150–400)

## 2012-08-19 LAB — URINALYSIS, ROUTINE W REFLEX MICROSCOPIC
Bilirubin Urine: NEGATIVE
Glucose, UA: NEGATIVE mg/dL
Ketones, ur: 15 mg/dL — AB
Leukocytes, UA: NEGATIVE
Protein, ur: NEGATIVE mg/dL

## 2012-08-19 LAB — HEMOGLOBIN A1C
Hgb A1c MFr Bld: 7.4 % — ABNORMAL HIGH (ref <5.7)
Mean Plasma Glucose: 166 mg/dL — ABNORMAL HIGH (ref <117)

## 2012-08-19 LAB — TSH: TSH: 2.01 u[IU]/mL (ref 0.350–4.500)

## 2012-08-19 MED ORDER — ACETAMINOPHEN 650 MG RE SUPP
650.0000 mg | RECTAL | Status: DC | PRN
  Administered 2012-08-19 – 2012-08-23 (×5): 650 mg via RECTAL
  Filled 2012-08-19 (×5): qty 1

## 2012-08-19 MED ORDER — SODIUM CHLORIDE 0.9 % IV SOLN
1000.0000 mg | Freq: Once | INTRAVENOUS | Status: AC
  Administered 2012-08-19: 1000 mg via INTRAVENOUS
  Filled 2012-08-19: qty 20

## 2012-08-19 MED ORDER — SODIUM CHLORIDE 0.9 % IV SOLN
INTRAVENOUS | Status: DC
  Administered 2012-08-19: 10:00:00 via INTRAVENOUS

## 2012-08-19 MED ORDER — LORAZEPAM 2 MG/ML IJ SOLN
0.5000 mg | Freq: Four times a day (QID) | INTRAMUSCULAR | Status: DC | PRN
  Administered 2012-08-19 (×2): 0.5 mg via INTRAVENOUS
  Filled 2012-08-19 (×2): qty 1

## 2012-08-19 MED ORDER — ACETAMINOPHEN-CODEINE #3 300-30 MG PO TABS: 1.0000 | ORAL_TABLET | ORAL | Status: DC | PRN

## 2012-08-19 MED ORDER — DEXTROSE-NACL 5-0.9 % IV SOLN
INTRAVENOUS | Status: DC
  Administered 2012-08-19: 17:00:00 via INTRAVENOUS

## 2012-08-19 MED ORDER — FAMOTIDINE IN NACL 20-0.9 MG/50ML-% IV SOLN
20.0000 mg | Freq: Two times a day (BID) | INTRAVENOUS | Status: DC
  Administered 2012-08-20 – 2012-08-25 (×11): 20 mg via INTRAVENOUS
  Filled 2012-08-19 (×12): qty 50

## 2012-08-19 MED ORDER — SODIUM CHLORIDE 0.9 % IV SOLN
500.0000 mg | Freq: Two times a day (BID) | INTRAVENOUS | Status: DC
  Administered 2012-08-20 – 2012-08-21 (×4): 500 mg via INTRAVENOUS
  Filled 2012-08-19 (×5): qty 5

## 2012-08-19 MED ORDER — SODIUM CHLORIDE 0.9 % IV SOLN
500.0000 mg | Freq: Two times a day (BID) | INTRAVENOUS | Status: DC
  Filled 2012-08-19: qty 5

## 2012-08-19 MED ORDER — ENOXAPARIN SODIUM 30 MG/0.3ML ~~LOC~~ SOLN
30.0000 mg | SUBCUTANEOUS | Status: DC
  Filled 2012-08-19: qty 0.3

## 2012-08-19 MED ORDER — SODIUM CHLORIDE 0.9 % IV SOLN
1000.0000 mg | INTRAVENOUS | Status: AC
  Administered 2012-08-19: 1000 mg via INTRAVENOUS
  Filled 2012-08-19: qty 10

## 2012-08-19 MED ORDER — SODIUM CHLORIDE 0.9 % IV SOLN
1000.0000 mg | Freq: Two times a day (BID) | INTRAVENOUS | Status: DC
  Filled 2012-08-19: qty 10

## 2012-08-19 MED ORDER — SODIUM CHLORIDE 0.9 % IV BOLUS (SEPSIS)
1000.0000 mL | Freq: Once | INTRAVENOUS | Status: AC
  Administered 2012-08-19: 1000 mL via INTRAVENOUS

## 2012-08-19 NOTE — Progress Notes (Signed)
Inpatient Diabetes Program Recommendations  AACE/ADA: New Consensus Statement on Inpatient Glycemic Control (2013)  Target Ranges:  Prepandial:   less than 140 mg/dL      Peak postprandial:   less than 180 mg/dL (1-2 hours)      Critically ill patients:  140 - 180 mg/dL  Results for [name] [name], [name] [name] (MRN [id number]) as of 08/19/2012 14:41  Ref. Range 08/19/2012 07:56 08/19/2012 11:51  Glucose-Capillary Latest Range: 70-99 mg/dL 119 (H) 147 (H)     Inpatient Diabetes Program Recommendations Insulin - Basal: Please consider starting basal insulin.  Patient takes Levemire 15 units every morning.  Note: Talked with the patient about home regimen of insulin at home.  According to the patient and her husband, patient takes Levemir 15 units every morning.  This is a new regimen for the patient, previously she took Levemir 12 units daily.  She has only taken the Levemir 15 units for 2 days prior to coming to the hospital this admission.  Will continue to follow.   Thanks, Orlando Penner, RN, BSN, CCRN Diabetes Coordinator Inpatient Diabetes Program [phone]

## 2012-08-19 NOTE — Progress Notes (Signed)
Called to see patient regarding persistent confusion.   Exam: PERRL, Does not open eyes to voice or sternal rub. She localizes to pain bilaterally and withdraws both legs briskly. She does not follow commands.   Assessment: 71 yo F with persistent confusion following seizure earlier today. I am concerned she may be having recurrent subclinical seizures, and therefore will load with dilantin, but I would not pursue more aggressive therapy at this time. She has already received small dose of ativan.   1) Dilantin load 20mg /kg, will check level in AM 2) Repeat EEG in the am.  3) Head Ct repeated, stable hemorrhage.

## 2012-08-19 NOTE — Progress Notes (Signed)
Foley cath removed, tolerated well. Call light within reach. Perianal care done.

## 2012-08-19 NOTE — Evaluation (Signed)
Clinical/Bedside Swallow Evaluation Patient Details  Name: [name] [name] MRN: [id number] Date of Birth: 1941/08/22  Today's Date: 08/19/2012 Time: 1020-1055 SLP Time Calculation (min): 35 min  Past Medical History:  Past Medical History  Diagnosis Date  . Diabetes mellitus   . GERD (gastroesophageal reflux disease)    Past Surgical History: History reviewed. No pertinent past surgical history. HPI:  71 year old female he should with underlying diabetes and hypertension who had recently been admitted during the month of September for symptomatic hypoglycemia as well as frequent falls. She lives with her husband and was brought to the emergency department after an outpatient MRI revealed a subacute right intraparenchymal bleed in the parietal region. The patient apparently had been expressing severe headaches and initially what her primary care physician office and because of associated confusion with the headache the MRI was obtained. Neurology was consulted.    Assessment / Plan / Recommendation Clinical Impression  Pt presents with appearance of normal swallow function. No indication of dysphagia or aspiration observed. Pt is safe to initiate a regualr diet with thin liquids. No SLP f/u needed for swallowing. 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     Aspiration Risk  None    Diet Recommendation Regular;Thin liquid   Liquid Administration via: Cup;Straw Medication Administration: Whole meds with liquid Supervision: Patient able to self feed Compensations: Slow rate;Small sips/bites Postural Changes and/or Swallow Maneuvers: Seated upright 90 degrees    Other  Recommendations Oral Care Recommendations: Patient independent with oral care   Follow Up Recommendations  None    Frequency and Duration        Pertinent Vitals/Pain NA    SLP Swallow Goals     Swallow Study Prior Functional Status       General  HPI: 71 year old female he should with underlying diabetes and hypertension who had recently been admitted during the month of September for symptomatic hypoglycemia as well as frequent falls. She lives with her husband and was brought to the emergency department after an outpatient MRI revealed a subacute right intraparenchymal bleed in the parietal region. The patient apparently had been expressing severe headaches and initially what her primary care physician office and because of associated confusion with the headache the MRI was obtained. Neurology was consulted.  Type of Study: Bedside swallow evaluation Diet Prior to this Study: NPO Temperature Spikes Noted: No Respiratory Status: Room air History of Recent Intubation: No Behavior/Cognition: Alert;Cooperative;Pleasant mood Oral Cavity - Dentition: Adequate natural dentition Self-Feeding Abilities: Able to feed self Patient Positioning: Upright in chair Baseline Vocal Quality: Clear Volitional Cough: Strong Volitional Swallow: Able to elicit    Oral/Motor/Sensory Function Overall Oral Motor/Sensory Function: Appears within functional limits for tasks assessed   Ice Chips     Thin Liquid Thin Liquid: Within functional limits Presentation: Cup;Straw    Nectar Thick Nectar Thick Liquid: Not tested   Honey Thick Honey Thick Liquid: Not tested   Puree Puree: Within functional limits   Solid   GO Functional Assessment Tool Used: clnical judgement Functional Limitations: Swallowing Swallow Current Status (W0981): 0 percent impaired, limited or restricted Swallow Discharge Status [phone]): 0 percent impaired, limited or restricted  Solid: Within functional limits      [facility], MA CCC-SLP [phone]  [name], [name] 08/19/2012,2:24 PM

## 2012-08-19 NOTE — Progress Notes (Addendum)
[facility] Progress Note [place] TEAM 1 - Stepdown/ICU TEAM   [name] [name] ZOX:[id number] DOB: 1941/02/27 DOA: 08/18/2012 PCP: Aida Puffer, MD  Brief narrative: 71 year old female he should with underlying diabetes and hypertension who had recently been admitted during the month of September for symptomatic hypoglycemia as well as frequent falls. She lives with her husband and was brought to the emergency department after an outpatient MRI revealed a subacute right intraparenchymal bleed in the parietal region. The patient apparently had been expressing severe headaches and initially what her primary care physician office and because of associated confusion with the headache the MRI was obtained. Neurology was consulted. Because of concerns of needing to keep tight control her blood pressure and the possibility of requiring IV infusions for this patient was admitted to the step down unit for further monitoring and treatment.  Assessment/Plan: Principal Problem:  *Hemorrhage in the brain-13 x 22 x 14 mm right posterior temporo-occipital intra-axial acute and subacute hemorrhage *Appreciate neurology assistance *Followup CT of the head pending *No evidence of amyloid deposits on preadmission MRI *Recommendations from neuro are to keep systolic blood pressure between 120 and 160 *Neurology recommended discontinuing all antiplatelet agents and utilizing SCDs for DVT prophylaxis  Seizure Due to above hemorrhage. Dr Pearlean Brownie has ordered Keppra IV bolus followed by maintenance doses and an EEG. I have spoken with the patient's husband in detail about this event.   Confusion/Agitation Due to post-ictal state after seizure- will order low dose PRN Ativan and a sitter.   Active Problems:  CKD (chronic kidney disease) stage 3, GFR 30-59 ml/min *Baseline BUN and creatinine is 27/1.18 *currently patient has mildly elevated BUN and creatinine and appears to be mildly dehydrated therefore we'll  continue low rate IV fluids at 50 cc per hour   Leukocytosis *Reactive in nature secondary to cerebral bleeding as well as recent dehydration *Urinalysis shows no evidence of infection   DM type 2 (diabetes mellitus, type 2) *No issues with hypoglycemia this admission *On Levemir and Glucophage at home as well as sliding scale insulin *Given her stage III chronic kidney disease and very poor oral intake this patient should not resume Glucophage *Currently CBGs are stable on sliding scale insulin *Because she was on metformin prior to admission and currently is n.p.o. and has a history of severe hypoglycemia dextrose was added to her IV fluids at time of admission. Once diet is resumed we'll likely need to remove dextrose from IV fluids   Dysphagia/ GERD (gastroesophageal reflux disease) *This was reported as a problem at time of admission the patient currently denies *To help clarify we have requested a speech therapy consultation to evaluate her swallowing- they have recommended a regular diet with thin liquids.  *Continue Pepcid- increase dose to 20 BID   HTN (hypertension) *was started on Lopressor last admission and this has been resumed here *As noted neurology was to keep systolic blood pressure between 120 and 160 so in addition to scheduled any hypertensive medications this patient has when necessary labetalol available   DVT prophylaxis: DC Lovenox in favor of SCDs Code Status: Full Family Communication: Spoke with patient Disposition Plan: follow in SDU  Consultants: Neurology  Procedures: None  Antibiotics: None  HPI/Subjective: Patient is awake and alert. She does have some trouble recalling her history especially dosages of medications but she also clarifies she is unable to see well and her husband is responsible for giving her her medications. She also said she was unsure meds she was taking the  Lopressor as scheduled. Denied chest pain or shortness of  breath. She was re-evaluated after her seizure and was noted to be unresponsive.  A third re-eval at 6 pm reveals her to be quite agitated, non-verbal and unable to recognize her husband.    Objective: Blood pressure 140/67, pulse 82, temperature 98.4 F (36.9 C), temperature source Oral, resp. rate 15, height 5\' 5"  (1.651 m), weight 49.896 kg (110 lb), SpO2 97.00%.  Intake/Output Summary (Last 24 hours) at 08/19/12 1100 Last data filed at 08/19/12 1009  Gross per 24 hour  Intake 1083.75 ml  Output    975 ml  Net 108.75 ml     Exam: Follow up exam completed  Data Reviewed: Basic Metabolic Panel:  Lab 08/18/12 4098 08/16/12 1614 08/14/12 1436  NA 138 135 134*  K 4.5 5.0 4.9  CL 99 101 97  CO2 27 25 26   GLUCOSE 51* 205* 278*  BUN 35* 33* 38*  CREATININE 1.34* 1.34* 1.45*  CALCIUM 9.3 9.0 9.0  MG -- -- --  PHOS -- -- --   Liver Function Tests:  Lab 08/16/12 1614 08/14/12 1436  AST 24 25  ALT 15 17  ALKPHOS 105 137*  BILITOT 0.3 0.3  PROT 6.2 6.7  ALBUMIN 3.7 3.8   No results found for this basename: LIPASE:5,AMYLASE:5 in the last 168 hours No results found for this basename: AMMONIA:5 in the last 168 hours CBC:  Lab 08/18/12 1818 08/16/12 1615 08/14/12 1507  WBC 11.0* 9.2 11.4*  NEUTROABS 6.5 -- --  HGB 12.6 12.4 13.3  HCT 38.0 41.6 43.6  MCV 94.8 99.9* 100.1*  PLT 270 -- --   Cardiac Enzymes: No results found for this basename: CKTOTAL:5,CKMB:5,CKMBINDEX:5,TROPONINI:5 in the last 168 hours BNP (last 3 results) No results found for this basename: PROBNP:3 in the last 8760 hours CBG:  Lab 08/19/12 0756 08/19/12 0354 08/19/12 0047 08/18/12 2017  GLUCAP 165* 82 81 214*    Recent Results (from the past 240 hour(s))  URINE CULTURE     Status: Normal   Collection Time   08/16/12  4:14 PM      Component Value Range Status Comment   Colony Count NO GROWTH   Final    Organism ID, Bacteria NO GROWTH   Final   MRSA PCR SCREENING     Status: Normal    Collection Time   08/19/12  1:33 AM      Component Value Range Status Comment   MRSA by PCR NEGATIVE  NEGATIVE Final      Studies:  Recent x-ray studies have been reviewed in detail by the Attending Physician  Scheduled Meds:  Reviewed in detail by the Attending Physician   Junious Silk, ANP [facility] Office  [phone] Pager [phone]  On-Call/Text Page:      [email or web address]      password [email or web address]  If 7PM-7AM, please contact night-coverage [email or web address] Password [email or web address] 08/19/2012, 11:00 AM   LOS: 1 day   I have examined this patient and reviewed the chart. I have modified the above note and agree with it.   Calvert Cantor, MD [phone]

## 2012-08-19 NOTE — H&P (Signed)
[facility] History and Physical  [name] [name] NFA:[id number] DOB: 02-06-1941    PCP:   Aida Puffer, MD   Chief Complaint: intracranial bleed.  HPI: [name] [name] is an 71 y.o. female with hx of DM, HTN, recent admission for hypoglycemia, frequent falls, lives at home with husband, brought into the ER after an MRI of her brain showed a area of subacute right intraparenchymal bleed in the parietal area.  She was seen in the office with confusion and an outpatient MRI of the head was performed.  She has a headache and has been unsteady on her gait.  In the ER, further work up included a normal INR, normal platelet count, WBC of 11K and normal hemoglobin of 12.6.  Neurologist was consulted and hospitalist was asked to admit her for intracranial bleed along with HTN.  Rewiew of Systems:  Constitutional: Negative for malaise, fever and chills. No significant weight loss or weight gain Eyes: Negative for eye pain, redness and discharge, diplopia, visual changes, or flashes of light. ENMT: Negative for ear pain, hoarseness, nasal congestion, sinus pressure and sore throat. No tinnitus, drooling, or problem swallowing. Cardiovascular: Negative for chest pain, palpitations, diaphoresis, dyspnea and peripheral edema. ; No orthopnea, PND Respiratory: Negative for cough, hemoptysis, wheezing and stridor. No pleuritic chestpain. Gastrointestinal: Negative for nausea, vomiting, diarrhea, constipation, abdominal pain, melena, blood in stool, hematemesis, jaundice and rectal bleeding.    Genitourinary: Negative for frequency, dysuria, incontinence,flank pain and hematuria; Musculoskeletal: Negative for back pain and neck pain. Negative for swelling and trauma.;  Skin: . Negative for pruritus, rash, abrasions, bruising and skin lesion.; ulcerations Neuro: Negative for lightheadedness and neck stiffness. Negative for weakness, altered level of consciousness , altered mental status, extremity weakness,  burning feet, involuntary movement, seizure and syncope.  Psych: negative for anxiety, depression, insomnia, tearfulness, panic attacks, hallucinations, paranoia, suicidal or homicidal ideation.   Past Medical History  Diagnosis Date  . Diabetes mellitus   . GERD (gastroesophageal reflux disease)     History reviewed. No pertinent past surgical history.  Medications:  HOME MEDS: Prior to Admission medications   Medication Sig Start Date End Date Taking? Authorizing Provider  insulin aspart (NOVOLOG) 100 UNIT/ML injection 0-9 Units, Subcutaneous, 3 times daily with meals CBG < 70: implement hypoglycemia protocol, Call MD  CBG 70 - 120: 0 units, CBG 121 - 150: 1 unit, CBG 151 - 200: 2 units, CBG 201 - 250: 3 units, CBG 251 - 300: 5 units, CBG 301 - 350: 7 units, CBG 351 - 400: 9 units, CBG > 400: call MD 07/28/12  Yes Shanker Levora Dredge, MD  insulin detemir (LEVEMIR) 100 UNIT/ML injection Inject 15 Units into the skin daily with breakfast. 07/28/12  Yes Shanker Levora Dredge, MD  metFORMIN (GLUCOPHAGE-XR) 500 MG 24 hr tablet Take 500 mg by mouth daily with breakfast.   Yes Historical Provider, MD  metoprolol tartrate (LOPRESSOR) 25 MG tablet Take 12.5 mg by mouth 2 (two) times daily.   Yes Historical Provider, MD  Multiple Vitamin (MULTIVITAMIN WITH MINERALS) TABS Take 1 tablet by mouth daily.   Yes Historical Provider, MD  omeprazole (PRILOSEC) 40 MG capsule Take 40 mg by mouth daily.   Yes Historical Provider, MD  vitamin C (ASCORBIC ACID) 500 MG tablet Take 500 mg by mouth daily.   Yes Historical Provider, MD     Allergies:  Allergies  Allergen Reactions  . Barbiturates     Social History:   reports that she has never smoked. She  does not have any smokeless tobacco history on file. She reports that she does not drink alcohol or use illicit drugs.  Family History: No family history on file.   Physical Exam: Filed Vitals:   08/18/12 2200 08/18/12 2300 08/18/12 2315  08/18/12 2330  BP: 147/72 153/83 146/60 152/67  Pulse: 87 92 90 92  Temp:      TempSrc:      Resp: 18 24 20 19   Height:      Weight:      SpO2: 98% 98% 98% 97%   Blood pressure 152/67, pulse 92, temperature 98.1 F (36.7 C), temperature source Oral, resp. rate 19, height 5\' 5"  (1.651 m), weight 49.896 kg (110 lb), SpO2 97.00%.  GEN:  Pleasant  patient lying in the stretcher in no acute distress; cooperative with exam. PSYCH:  alert and oriented x4; does not appear anxious or depressed; affect is appropriate. HEENT: Mucous membranes pink and anicteric; PERRLA; EOM intact; no cervical lymphadenopathy nor thyromegaly or carotid bruit; no JVD; There were no stridor. Neck is very supple. Breasts:: Not examined CHEST WALL: No tenderness CHEST: Normal respiration, clear to auscultation bilaterally.  HEART: Regular rate and rhythm.  There are no murmur, rub, or gallops.   BACK: No kyphosis or scoliosis; no CVA tenderness ABDOMEN: soft and non-tender; no masses, no organomegaly, normal abdominal bowel sounds; no pannus; no intertriginous candida. There is no rebound and no distention. Rectal Exam: Not done EXTREMITIES: No bone or joint deformity; age-appropriate arthropathy of the hands and knees; no edema; no ulcerations.  There is no calf tenderness. Genitalia: not examined PULSES: 2+ and symmetric SKIN: Normal hydration no rash or ulceration CNS: Cranial nerves 2-12 grossly intact no focal lateralizing neurologic deficit.  Speech is fluent; uvula elevated with phonation, facial symmetry and tongue midline. DTR are normal bilaterally, cerebella exam is intact, barbinski is negative and strengths are equaled bilaterally.  No sensory loss. Left visual field deficit to confrontation.  She failed her bedside swallowing test.   Labs on Admission:  Basic Metabolic Panel:  Lab 08/18/12 4098 08/16/12 1614 08/14/12 1436  NA 138 135 134*  K 4.5 5.0 4.9  CL 99 101 97  CO2 27 25 26   GLUCOSE 51*  205* 278*  BUN 35* 33* 38*  CREATININE 1.34* 1.34* 1.45*  CALCIUM 9.3 9.0 9.0  MG -- -- --  PHOS -- -- --   Liver Function Tests:  Lab 08/16/12 1614 08/14/12 1436  AST 24 25  ALT 15 17  ALKPHOS 105 137*  BILITOT 0.3 0.3  PROT 6.2 6.7  ALBUMIN 3.7 3.8   No results found for this basename: LIPASE:5,AMYLASE:5 in the last 168 hours No results found for this basename: AMMONIA:5 in the last 168 hours CBC:  Lab 08/18/12 1818 08/16/12 1615 08/14/12 1507  WBC 11.0* 9.2 11.4*  NEUTROABS 6.5 -- --  HGB 12.6 12.4 13.3  HCT 38.0 41.6 43.6  MCV 94.8 99.9* 100.1*  PLT 270 -- --   Cardiac Enzymes: No results found for this basename: CKTOTAL:5,CKMB:5,CKMBINDEX:5,TROPONINI:5 in the last 168 hours  CBG:  Lab 08/18/12 2017  GLUCAP 214*     Radiological Exams on Admission: Mr Laqueta Jean Wo Contrast  08/18/2012  *RADIOLOGY REPORT*  Clinical Data: Bilateral temporal headaches and confusion with increased loss of vision for 1 week. History of diabetes.  MRI HEAD WITHOUT AND WITH CONTRAST  Technique:  Multiplanar, multiecho pulse sequences of the brain and surrounding structures were obtained according to standard protocol without  and with intravenous contrast  Contrast: 10mL MULTIHANCE GADOBENATE DIMEGLUMINE 529 MG/ML IV SOLN  Comparison: None.  Findings: There is a 13 x 22 x 14 mm right posterior temporo- occipital intra-axial hemorrhage consistent with an event 1 week ago.  There is T2 shortening and T1 shortening admixed within the lesion (deoxyhemoglobin and methemoglobin, respectively).  Mild surrounding edema.  No significant restricted diffusion to suggest acute infarction with reperfusion.  No extra-axial hematoma.  No other parenchymal acute or chronic hemorrhage.  Post infusion, no definite abnormal enhancement above and beyond the intrinsic T1 shortening due to methemoglobin.  There is mild age related atrophy.  Mild chronic microvascular ischemic changes noted in the periventricular and  subcortical white matter, nonspecific nature.  Major intracranial vascular structures appear patent.  There is no visible arteriovenous malformation.  No worrisome osseous lesions. Paranasal sinuses, orbits, and mastoids appear clear.  IMPRESSION: 13 x 22 x 14 mm right posterior temporo-occipital intra-axial acute and subacute hemorrhage consistent with an event occurring 1 week ago.  The appearance is nonspecific and could represent hemorrhage is secondary to hypertension, anticoagulation, occult trauma, venous angioma or cavernoma (OCVM), pial AVM, hemorrhagic metastasis, or idiopathic.  Continued surveillance is warranted. Findings discussed with Heather at [facility].   Original Report Authenticated By: Elsie Stain, M.D.    Dg Chest Portable 1 View  08/18/2012  *RADIOLOGY REPORT*  Clinical Data: Pain and confusion.  Diabetes.  PORTABLE CHEST - 1 VIEW  Comparison: 07/26/2012  Findings: 1917 hours. The lungs are clear without focal infiltrate, edema, pneumothorax or pleural effusion. The cardiopericardial silhouette is within normal limits for size. Imaged bony structures of the thorax are intact. Telemetry leads overlie the chest.  IMPRESSION: No acute cardiopulmonary process.   Original Report Authenticated By: ERIC A. MANSELL, M.D.      Assessment/Plan Present on Admission:  .DM type 2 (diabetes mellitus, type 2) .Hemorrhage of brain, nontraumatic .Dysphagia .HTN (hypertension)   PLAN:  Will admit her to SDU as recommended by neurology and keep SBP in range (120-160).  Will have MRI imaging follow up.  For her DM, will give IVF and SSI.  She couldn't do her bedside swallowing test, but hopefully, she would be tomorrow.  I will use labetelol IV for her BP controlled.  She is a full code and will be admitted to [facility] service.  Other plans as per orders.  Code Status: FULL Unk Lightning, MD. [facility] Pager [phone] 7pm to 7am.  08/19/2012, 12:38 AM

## 2012-08-19 NOTE — Progress Notes (Signed)
Call was received from the e-link that  pt. seemed to have seizure, went to check on pt at once , noted with movement on her head, drooling of the saliva, right facial droop lasted for approximately a min. Non responsive verbally but pointing at her neck. Dr Butler Denmark made aware and came to see pt at once.  Dr.sethi made aware with order. V/s taken . o2 2L applied. kepra iv given and EEG done. Continue to monitor.

## 2012-08-19 NOTE — Progress Notes (Signed)
Portable EEG completed

## 2012-08-19 NOTE — Consult Note (Addendum)
Name: [name] [name] MRN: [id number] DOB: 09-Sep-1941    LOS: 1  Referring Provider:  [facility] Reason for Referral:  AMS in setting of altered mental status  PULMONARY / CRITICAL CARE MEDICINE  HPI:  71 y/o female with DM, GERD was admitted on 10/17 to [facility] after she was found to have evidence for a subacute intraparenchymal hemorrhage on an outpatient MRI.  The patient had been experiencing headaches and underwent the MRI on 10/16.  See description below.  Her husband states that she fell on week ago but did not strike her head and he was unaware of other falls.  She was admitted to [facility] on the [facility] service with Neurology consulting.  After admission on 10/17 (early AM) she developed a seizure in the afternoon.  She was treated with Keppra and remained confused thereafter.  An EEG was performed because of the ongoing confusion and showed no seizure activity.  A head CT was performed around noon and showed the R posterior temporo-parietal intraparenchymal hemorrhage without change.  Her confusion and agitation persisted and a second head CT was performed in the evening showing again no change.  Neurology felt that adding dilantin for possible ongoing status epilepticus was needed, but that this diagnosis was less likely considering the prior negative EEG.  PCCM was consulted for ongoing altered mental status.  Past Medical History  Diagnosis Date  . Diabetes mellitus   . GERD (gastroesophageal reflux disease)    History reviewed. No pertinent past surgical history. Prior to Admission medications   Medication Sig Start Date End Date Taking? Authorizing Provider  insulin aspart (NOVOLOG) 100 UNIT/ML injection 0-9 Units, Subcutaneous, 3 times daily with meals CBG < 70: implement hypoglycemia protocol, Call MD  CBG 70 - 120: 0 units, CBG 121 - 150: 1 unit, CBG 151 - 200: 2 units, CBG 201 - 250: 3 units, CBG 251 - 300: 5 units, CBG 301 - 350: 7 units, CBG 351 - 400: 9 units, CBG > 400: call MD 07/28/12   Yes Shanker Levora Dredge, MD  insulin detemir (LEVEMIR) 100 UNIT/ML injection Inject 15 Units into the skin daily with breakfast. 07/28/12  Yes Shanker Levora Dredge, MD  metFORMIN (GLUCOPHAGE-XR) 500 MG 24 hr tablet Take 500 mg by mouth daily with breakfast.   Yes Historical Provider, MD  metoprolol tartrate (LOPRESSOR) 25 MG tablet Take 12.5 mg by mouth 2 (two) times daily.   Yes Historical Provider, MD  Multiple Vitamin (MULTIVITAMIN WITH MINERALS) TABS Take 1 tablet by mouth daily.   Yes Historical Provider, MD  omeprazole (PRILOSEC) 40 MG capsule Take 40 mg by mouth daily.   Yes Historical Provider, MD  vitamin C (ASCORBIC ACID) 500 MG tablet Take 500 mg by mouth daily.   Yes Historical Provider, MD   Allergies Allergies  Allergen Reactions  . Barbiturates     Family History No family history on file. Social History  reports that she has never smoked. She does not have any smokeless tobacco history on file. She reports that she does not drink alcohol or use illicit drugs.  Review Of Systems:  Cannot obtain because obtunded  Brief patient description:  71 y/o female was admitted on 10/17 to [facility] with a subacute intracranial bleed noted on an outpatient MRI ordered for headache.  Within 24 hours of admission she developed fever, seizure, and ongoing confusion.  Events Since Admission: 10/17 CT head R posterior temporoparietal intraparenchymal hemorrhage  Without significant change 10/17 CT head R posterior temporoparietal intraparenchymal hemorrhage  1.3 x 2.1 cm 10/15 MRI brain 1.3x2.2x1.4cm R posterior temporoparietal intraparenchymal hemorrhage consistent with an event occuring one week ago  Current Status:  Vital Signs: Temp:  [98.1 F (36.7 C)-101.3 F (38.5 C)] 101.3 F (38.5 C) (10/17 2035) Pulse Rate:  [74-139] 139  (10/17 2230) Resp:  [7-25] 19  (10/17 2230) BP: (106-173)/(43-95) 163/70 mmHg (10/17 2200) SpO2:  [90 %-100 %] 100 % (10/17 2230)  Physical Examination: Gen:  lying in bed, non verbal, nonspecific movements to sternal rub HEENT: NCAT, pupils dilated, responsive to light bilaterally, MM dry PULM: Insp crackles in R base CV: Tachy, gallop noted in apex, no JVD AB: BS+, soft, nontender, no hsm Ext: warm, no edema, no clubbing, no cyanosis Derm: no rash or skin breakdown Neuro: Does not open eyes to pain, localizes to pain on right, non-verbal   Principal Problem:  *Hemorrhage in the brain-13 x 22 x 14 mm right posterior temporo-occipital intra-axial acute Active Problems:  GERD (gastroesophageal reflux disease)  DM type 2 (diabetes mellitus, type 2)  Dysphagia  HTN (hypertension)  CKD (chronic kidney disease) stage 3, GFR 30-59 ml/min  Leukocytosis  Left hip pain  Left hemiparesis  Hemianopia, homonymous, left  Hemisensory deficit, left  Nausea  Headache   ASSESSMENT AND PLAN  NEUROLOGIC  A:   Sub acute intraparenchymal hemorrhage, likely occurred one week prior to admission based on imaging; stable with multiple images today  Ongoing encephalopathy of uncertain etiology, ddx post ictal state, non-convulsive status epilepticus, delirium from fever, ativan related; Neurology treating for non-convulsive status but feel this is less likely.  At this point protecting airway but needs ICU level observation;  P:   -monitor glucose carefully -check ammonia, bmet, abg looking for other causes of encephalopathy -keppra/dilantin per neurology -repeat EEG and imaging per neurology -BP control with labetalol, Goal SBP < 160   PULMONARY No results found for this basename: PHART:5,PCO2:5,PCO2ART:5,PO2ART:5,HCO3:5,O2SAT:5 in the last 168 hours Ventilator Settings:   CXR:  Emphysema and fibrosis, no infiltrate ETT:  n/a  A:   Currently protecting airway but high risk for intubation COPD and fibrosis?  Oxygenating well, not wheezing P:   -monitor respiratory status in ICU -frequent neuro checks to ensure protecting airway -check  ABG  CARDIOVASCULAR No results found for this basename: TROPONINI:5,LATICACIDVEN:5, O2SATVEN:5,PROBNP:5 in the last 168 hours ECG:  Sinus tach, RBBB, nonspecific st wave changes Lines: peripheral IV  A: Sinus tach, with fever, SIRS?  Volume deplete from poor po intake? P:  -gentle IVF with isotonic fluids overnight -pan culture -check u/a to look for fever  RENAL  Lab 08/18/12 1818 08/16/12 1614 08/14/12 1436  NA 138 135 134*  K 4.5 5.0 --  CL 99 101 97  CO2 27 25 26   BUN 35* 33* 38*  CREATININE 1.34* 1.34* 1.45*  CALCIUM 9.3 9.0 9.0  MG -- -- --  PHOS -- -- --   Intake/Output      10/17 0701 - 10/18 0700   I.V. (mL/kg) 850 (17)   Other 400   IV Piggyback 1150   Total Intake(mL/kg) 2400 (48.1)   Urine (mL/kg/hr) 450 (0.6)   Total Output 450   Net +1950        Foley:  n/a  A:  CKD, Cr near baseline P:   -monitor UOP -gentle IVF -repeat BMET in AM  GASTROINTESTINAL  Lab 08/16/12 1614 08/14/12 1436  AST 24 25  ALT 15 17  ALKPHOS 105 137*  BILITOT 0.3 0.3  PROT  6.2 6.7  ALBUMIN 3.7 3.8    A:  No acute issues P:   NPO  HEMATOLOGIC  Lab 08/19/12 2214 08/18/12 1818 08/16/12 1615 08/14/12 1507  HGB 13.5 12.6 12.4 13.3  HCT 40.6 38.0 41.6 43.6  PLT PENDING 270 -- --  INR -- 0.93 -- --  APTT -- -- -- --   A:  Intraparenchymal bleed as above, otherwise no acute issues P:  -monitor CBC  INFECTIOUS  Lab 08/19/12 2214 08/18/12 1818 08/16/12 1615 08/14/12 1507  WBC PENDING 11.0* 9.2 11.4*  PROCALCITON -- -- -- --   Cultures: 10/17 blood >> 10/17 urine >> Antibiotics:   A:  Fever x1, rising WBC but no clear source of infection; CXR without infiltrate P:   -check U/A now -pan culture -APAP now -hold antibiotics unless clear source -doubt meningitis/encephalitis given clinical course (no preceding fever, neck stiffness), but if mental status does not improve would perform LP  ENDOCRINE  Lab 08/19/12 2242 08/19/12 1918 08/19/12 1810 08/19/12  1641 08/19/12 1151  GLUCAP 171* 139* 141* 73 151*   A:  DM2 P:   -SSI, accuchecks  CC time 45 minutes  BEST PRACTICE / DISPOSITION Level of Care:  ICU Primary Service:  PCCM Consultants:  Neurology Code Status:  full Diet:  npo DVT Px:  scd GI Px:  pepcid Skin Integrity:  Normal on admission Social / Family:  Updated husband at bedside  Max Fickle, M.D. Pulmonary and Critical Care Medicine [facility] Pager: [phone]  08/19/2012, 11:03 PM

## 2012-08-19 NOTE — Progress Notes (Signed)
Stroke Team Progress Note  HISTORY  [name] [name] is an 71 y.o. female who was in her normal state of health until 10 days ago at which point she complained of headache and nausea and vomiting. Subsequently, 3 days later her headache got worse and she started becoming confused. For the past 7 days, she has been stable, but not getting any better.   Currently, her husband states that she runs into things on the left, stumbling, and just not seeming like herself. It does not wax and wane. She also may have some trouble with her vision.   Of note, she was told to take aspirin every 6 hours along with Tylenol and has been doing so for the past 2 days. She was not taking regular aspirin prior to her onset of symptoms.   Patient was not a TPA candidate secondary to hemorrhage. She was admitted to the ICU for further evaluation and treatment.  SUBJECTIVE Her husband is at the bedside.  Overall she feels her condition is gradually improving. Worsening vision past 1 week. Headaches and nausea, which is unusual for her.  OBJECTIVE Most recent Vital Signs: Filed Vitals:   08/19/12 0400 08/19/12 0600 08/19/12 0757 08/19/12 0800  BP: 131/65 106/43 147/79 140/67  Pulse: 74 74 88 82  Temp: 98.1 F (36.7 C)  98.4 F (36.9 C)   TempSrc: Oral  Oral   Resp: 8 7 16 15   Height:      Weight:      SpO2: 96% 90% 98% 97%   CBG (last 3)   Basename 08/19/12 0756 08/19/12 0354 08/19/12 0047  GLUCAP 165* 82 81    IV Fluid Intake:     . dextrose 5 % and 0.9% NaCl 50 mL/hr at 08/19/12 0847    MEDICATIONS    . sodium chloride   Intravenous Once  . dextrose  1 ampule Intravenous Once  . famotidine (PEPCID) IV  10 mg Intravenous Q24H  . insulin aspart  0-9 Units Subcutaneous Q4H  . metoprolol tartrate  12.5 mg Oral BID  . ondansetron  4 mg Intravenous Once  . sodium chloride  3 mL Intravenous Q12H   PRN:  labetalol, ondansetron (ZOFRAN) IV, ondansetron  Diet:  NPO  Activity:   Up with assistance DVT  Prophylaxis:  SCDs   CLINICALLY SIGNIFICANT STUDIES Basic Metabolic Panel:  Lab 08/18/12 1610 08/16/12 1614  NA 138 135  K 4.5 5.0  CL 99 101  CO2 27 25  GLUCOSE 51* 205*  BUN 35* 33*  CREATININE 1.34* 1.34*  CALCIUM 9.3 9.0  MG -- --  PHOS -- --   Liver Function Tests:  Lab 08/16/12 1614 08/14/12 1436  AST 24 25  ALT 15 17  ALKPHOS 105 137*  BILITOT 0.3 0.3  PROT 6.2 6.7  ALBUMIN 3.7 3.8   CBC:  Lab 08/18/12 1818 08/16/12 1615  WBC 11.0* 9.2  NEUTROABS 6.5 --  HGB 12.6 12.4  HCT 38.0 41.6  MCV 94.8 99.9*  PLT 270 --   Coagulation:  Lab 08/18/12 1818  LABPROT 12.4  INR 0.93   Cardiac Enzymes: No results found for this basename: CKTOTAL:3,CKMB:3,CKMBINDEX:3,TROPONINI:3 in the last 168 hours Urinalysis:  Lab 08/18/12 2129 08/16/12 1531  COLORURINE YELLOW --  LABSPEC 1.013 --  PHURINE 6.0 --  GLUCOSEU 100* --  HGBUR NEGATIVE --  BILIRUBINUR NEGATIVE neg  KETONESUR NEGATIVE --  PROTEINUR NEGATIVE --  UROBILINOGEN 0.2 0.2  NITRITE NEGATIVE neg  LEUKOCYTESUR NEGATIVE Negative   Lipid  Panel No results found for this basename: chol, trig, hdl, cholhdl, vldl, ldlcalc   HgbA1C  Lab Results  Component Value Date   HGBA1C 7.3* 07/26/2012    Urine Drug Screen:   No results found for this basename: labopia, cocainscrnur, labbenz, amphetmu, thcu, labbarb    Alcohol Level: No results found for this basename: ETH:2 in the last 168 hours   CT of the brain    MRI of the brain  08/18/2012 13 x 22 x 14 mm right posterior temporo-occipital intra-axial acute and subacute hemorrhage consistent with an event occurring 1 week ago.  The appearance is nonspecific and could represent hemorrhage is secondary to hypertension, anticoagulation, occult trauma, venous angioma or cavernoma (OCVM), pial AVM, hemorrhagic metastasis, or idiopathic.  Continued surveillance is warranted.   MRA of the brain    2D Echocardiogram    Carotid Doppler    CXR  08/18/2012 No acute  cardiopulmonary process.    EKG  normal sinus rhythm, PAC's noted.   Therapy Recommendations PT - ; OT - ; ST -   Physical Exam pleasant elderly Caucasian lady currently not in distress.Awake alert. Afebrile. Head is nontraumatic. Neck is supple without bruit. Hearing is normal. Cardiac exam no murmur or gallop. Lungs are clear to auscultation. Distal pulses are well felt.  Neurological Exam : Awake  Alert oriented x 3. Normal speech and language. Intact attention, registration and recall. Fundi were not visualized. Vision acuity and fields appear normal..eye movements full without nystagmus.dense left homonymous hemianopsia. Face symmetric. Tongue midline. Normal strength, tone, reflexes and coordination except mild diminished fine finger movements on the left. Orbits right over left upper extremity. Mild diminished sensation on the left.. . Gait deferred.   ASSESSMENT [name] is a 71 y.o. female presenting with headache and nausea. Imaging confirms a right posterior temporo-occipital intra-axial hemorrhage. Hemorrhage most likely secondary to hypertension and frequent ingestion of aspirin (new antihypertensives started within the past 1 mo with recent q6h aspirin), less likely amyloid angiopathy. Work up underway. On aspirin q 6 hr for headache prior to admission. Patient with resultant headache, nausea, left hip pain, left hemiparesis, left field cut, left hemisensory deficit, dysphagia.   Diabetes, HgbA1c 7.3  GERD  Left hip pain, recent fall per husband  Creatinine 1.34 Hypertension, new medications added within past 1 mo  Hospital day # 1  TREATMENT/PLAN  Change D5.45 to NS given recent stroke and potential risk for cerebral edema  Tylenol #3 for headache  Change SCDs to lovenox for optimal VTE prophy. Per guidelines, lovenox optimal pharmacologic treatment in hemorrhage, 24-48 h after ictus if pt hemodynamically stable.  Xray left hip  Repeat CT to eval for any  change in hemorrhage  Keep hydrated  Annie Main, MSN, RN, ANVP-BC, ANP-BC, GNP-BC [facility] Pager: [phone] 08/19/2012 9:41 AM  Scribe for Dr. Delia Heady, Stroke Center Medical Director, who has personally reviewed chart, pertinent data, examined the patient and developed the plan of care. Pager:  [phone]

## 2012-08-19 NOTE — Progress Notes (Signed)
Noted by camera evaluation that patient appeared to be having a seizure.  Bedside RN notified, and attending MD notified.  MD at bedside and orders written.  Will continue to monitor.

## 2012-08-19 NOTE — Progress Notes (Signed)
S: Pt examined and chart reviewed. I was asked to evaluate patient by nursing staff because of persistent MS changes since seizure this afternoon as well as new fever. She was apparently alert and oriented this afternoon up until the seizure around 3pm. A repeat head CT this morning before the seizure showed a stable bleed. Pt now not responding to verbal stimuli, will withdraw to painful stimuli.   O: HR 140s Temp 101.25F other VSS. She is laying in the bed, appears uncomfortable, rolling from side to side at times. Does not follow commands. Eyes are closed. Lungs CTA. CV: Tachycardic but regular, 2+ distal pulses bilateral UE and LE. Pupils equal, left pupil sluggish compared to right. Babinski's downgoing in LLE, withdraws on RLE.   A/P: 1.) AMS - at this point main concern worsening bleed. Spoke with Dr. Amada Jupiter who is on for neurology tonight and he recommended repeat head CT to r/o extension of bleed. He also reviewed the EEG which has not been read yet but stated that although no gross abnormalities she could be having some ongoing seizure activity. He stated that he would come to evaluate the patient when pt returned from CT. 2.) Fever - possibly secondary to seizure, bleed. Also possibility for aspiration. Blood cultures, UA, CBC, BMET. Will obtain CXR as well.   Will possibly transfer to neuro ICU after discussion with Dr. Amada Jupiter.

## 2012-08-19 NOTE — Evaluation (Signed)
Speech Language Pathology Evaluation Patient Details Name: [name] [name] MRN: [id number] DOB: Jan 06, 1941 Today's Date: 08/19/2012 Time: 3086-5784 SLP Time Calculation (min): 35 min  Problem List:  Patient Active Problem List  Diagnosis  . GERD (gastroesophageal reflux disease)  . DM type 2 (diabetes mellitus, type 2)  . Hypoglycemia due to insulin  . Hemorrhage of brain, nontraumatic  . Dysphagia  . HTN (hypertension)  . CKD (chronic kidney disease) stage 3, GFR 30-59 ml/min  . Leukocytosis  . Hemorrhage in the brain-13 x 22 x 14 mm right posterior temporo-occipital intra-axial acute  . Left hip pain  . Left hemiparesis  . Hemianopia, homonymous, left  . Hemisensory deficit, left  . Nausea  . Headache   Past Medical History:  Past Medical History  Diagnosis Date  . Diabetes mellitus   . GERD (gastroesophageal reflux disease)    Past Surgical History: History reviewed. No pertinent past surgical history. HPI:  71 year old female he should with underlying diabetes and hypertension who had recently been admitted during the month of September for symptomatic hypoglycemia as well as frequent falls. She lives with her husband and was brought to the emergency department after an outpatient MRI revealed a subacute right intraparenchymal bleed in the parietal region. The patient apparently had been expressing severe headaches and initially what her primary care physician office and because of associated confusion with the headache the MRI was obtained. Neurology was consulted.    Assessment / Plan / Recommendation Clinical Impression  Pt presents with cognitive deficits including left neglect and field cut (per MD), and poor awareness, problem solving and memory in functional tasks only. Pt was able to complete complex reasoning with verbal tasks given time to process information, she was able to recall four words repeatedly with prospective memory. Pt continually talked to husband who she  could not recall was not in the room and she struggled with functional problem solving with self feeding. These findings are consistent with a right CVA. SLp will continue to follow on acute level for increased independence with functional tasks. Would recommend CIR vs outpatient depending on physical deficits.     SLP Assessment  Patient needs continued Speech Lanaguage Pathology Services    Follow Up Recommendations  Inpatient Rehab    Frequency and Duration min 2x/week  2 weeks   Pertinent Vitals/Pain NA   SLP Goals  SLP Goals SLP Goal #1: Pt will complete basic functional problem solving task with min contextual cues x1.  SLP Goal #2: Pt will demonstrate emergent awareness of deficits during functional task with min contextual cues.  SLP Goal #3: Pt will scan to the left during functional tasks with 80% accuracy and min verbal cues.   SLP Evaluation Prior Functioning  Cognitive/Linguistic Baseline: Within functional limits Lives With: Spouse Available Help at Discharge: Family   Cognition  Overall Cognitive Status: Impaired Arousal/Alertness: Awake/alert Orientation Level: Oriented X4 Attention: Focused;Sustained Focused Attention: Appears intact Sustained Attention: Appears intact Memory: Impaired Memory Impairment: Decreased recall of new information;Decreased short term memory Decreased Short Term Memory: Functional basic;Functional complex Awareness: Impaired Awareness Impairment: Emergent impairment;Anticipatory impairment;Intellectual impairment Problem Solving: Impaired Problem Solving Impairment: Functional basic;Functional complex Executive Function: Self Monitoring Self Monitoring: Impaired Self Monitoring Impairment: Functional basic;Functional complex Safety/Judgment: Impaired    Comprehension  Auditory Comprehension Overall Auditory Comprehension: Appears within functional limits for tasks assessed    Expression Verbal Expression Overall Verbal  Expression: Appears within functional limits for tasks assessed   Oral / Motor Oral Motor/Sensory Function  Overall Oral Motor/Sensory Function: Appears within functional limits for tasks assessed Motor Speech Overall Motor Speech: Appears within functional limits for tasks assessed   GO Functional Assessment Tool Used: clnical judgement Functional Limitations: Swallowing Swallow Current Status (E4540): 0 percent impaired, limited or restricted Swallow Discharge Status (J8119): 0 percent impaired, limited or restricted  .bdsing  [name] [name], [name] [name] 08/19/2012, 4:08 PM

## 2012-08-20 ENCOUNTER — Inpatient Hospital Stay (HOSPITAL_COMMUNITY): Payer: Medicare Other

## 2012-08-20 ENCOUNTER — Encounter: Payer: Self-pay | Admitting: Internal Medicine

## 2012-08-20 DIAGNOSIS — R569 Unspecified convulsions: Secondary | ICD-10-CM | POA: Diagnosis not present

## 2012-08-20 DIAGNOSIS — J9600 Acute respiratory failure, unspecified whether with hypoxia or hypercapnia: Secondary | ICD-10-CM

## 2012-08-20 DIAGNOSIS — G40409 Other generalized epilepsy and epileptic syndromes, not intractable, without status epilepticus: Secondary | ICD-10-CM | POA: Diagnosis not present

## 2012-08-20 DIAGNOSIS — J9601 Acute respiratory failure with hypoxia: Secondary | ICD-10-CM | POA: Diagnosis present

## 2012-08-20 LAB — BLOOD GAS, ARTERIAL
Acid-Base Excess: 0.3 mmol/L (ref 0.0–2.0)
Acid-base deficit: 3.2 mmol/L — ABNORMAL HIGH (ref 0.0–2.0)
Bicarbonate: 20.9 mEq/L (ref 20.0–24.0)
Drawn by: 24513
Drawn by: 27755
FIO2: 0.4 %
MECHVT: 450 mL
MECHVT: 450 mL
O2 Content: 2 L/min
O2 Saturation: 97.2 %
O2 Saturation: 99.5 %
PEEP: 5 cmH2O
PEEP: 5 cmH2O
Patient temperature: 95.5
Patient temperature: 98.6
RATE: 12 resp/min
TCO2: 21.8 mmol/L (ref 0–100)
pCO2 arterial: 38.3 mmHg (ref 35.0–45.0)
pH, Arterial: 7.502 — ABNORMAL HIGH (ref 7.350–7.450)
pH, Arterial: 7.365 (ref 7.350–7.450)
pO2, Arterial: 194 mmHg — ABNORMAL HIGH (ref 80.0–100.0)

## 2012-08-20 LAB — BASIC METABOLIC PANEL
BUN: 19 mg/dL (ref 6–23)
CO2: 27 mEq/L (ref 19–32)
Chloride: 107 mEq/L (ref 96–112)
Creatinine, Ser: 1.22 mg/dL — ABNORMAL HIGH (ref 0.50–1.10)
GFR calc Af Amer: 50 mL/min — ABNORMAL LOW (ref >90)
Potassium: 4.4 mEq/L (ref 3.5–5.1)

## 2012-08-20 LAB — AMMONIA: Ammonia: 36 umol/L (ref 11–60)

## 2012-08-20 LAB — GLUCOSE, CAPILLARY
Glucose-Capillary: 103 mg/dL — ABNORMAL HIGH (ref 70–99)
Glucose-Capillary: 131 mg/dL — ABNORMAL HIGH (ref 70–99)
Glucose-Capillary: 156 mg/dL — ABNORMAL HIGH (ref 70–99)
Glucose-Capillary: 157 mg/dL — ABNORMAL HIGH (ref 70–99)
Glucose-Capillary: 207 mg/dL — ABNORMAL HIGH (ref 70–99)
Glucose-Capillary: 236 mg/dL — ABNORMAL HIGH (ref 70–99)

## 2012-08-20 LAB — CBC
Hemoglobin: 12.1 g/dL (ref 12.0–15.0)
MCH: 31.4 pg (ref 26.0–34.0)
Platelets: 245 10*3/uL (ref 150–400)

## 2012-08-20 LAB — HEPATIC FUNCTION PANEL
AST: 51 U/L — ABNORMAL HIGH (ref 0–37)
Albumin: 3.2 g/dL — ABNORMAL LOW (ref 3.5–5.2)
Alkaline Phosphatase: 85 U/L (ref 39–117)
Total Bilirubin: 0.5 mg/dL (ref 0.3–1.2)
Total Protein: 6.5 g/dL (ref 6.0–8.3)

## 2012-08-20 LAB — PHENYTOIN LEVEL, TOTAL
Phenytoin Lvl: 19.4 ug/mL (ref 10.0–20.0)
Phenytoin Lvl: 12.9 ug/mL (ref 10.0–20.0)

## 2012-08-20 LAB — PROCALCITONIN: Procalcitonin: <0.1 ng/mL

## 2012-08-20 LAB — TSH: TSH: 0.7 u[IU]/mL (ref 0.350–4.500)

## 2012-08-20 LAB — LACTIC ACID, PLASMA: Lactic Acid, Venous: 1.6 mmol/L (ref 0.5–2.2)

## 2012-08-20 MED ORDER — SODIUM CHLORIDE 0.9 % IV SOLN: INTRAVENOUS | Status: DC

## 2012-08-20 MED ORDER — FENTANYL CITRATE 0.05 MG/ML IJ SOLN
50.0000 ug | INTRAMUSCULAR | Status: DC | PRN
  Administered 2012-08-20: 25 ug via INTRAVENOUS
  Administered 2012-08-20 (×3): 50 ug via INTRAVENOUS
  Administered 2012-08-20: 25 ug via INTRAVENOUS
  Administered 2012-08-20: 50 ug via INTRAVENOUS
  Administered 2012-08-20: 25 ug via INTRAVENOUS
  Filled 2012-08-20 (×4): qty 2

## 2012-08-20 MED ORDER — DEXTROSE 50 % IV SOLN
50.0000 mL | Freq: Once | INTRAVENOUS | Status: AC | PRN
  Administered 2012-08-20: 50 mL via INTRAVENOUS

## 2012-08-20 MED ORDER — CHLORHEXIDINE GLUCONATE 0.12 % MT SOLN
15.0000 mL | Freq: Two times a day (BID) | OROMUCOSAL | Status: DC
  Administered 2012-08-20 – 2012-08-27 (×14): 15 mL via OROMUCOSAL
  Filled 2012-08-20 (×16): qty 15

## 2012-08-20 MED ORDER — DEXTROSE 5 % IV SOLN
INTRAVENOUS | Status: DC
  Administered 2012-08-20: 21:00:00 via INTRAVENOUS

## 2012-08-20 MED ORDER — AMPICILLIN-SULBACTAM SODIUM 3 (2-1) G IJ SOLR
3.0000 g | Freq: Three times a day (TID) | INTRAMUSCULAR | Status: DC
  Administered 2012-08-20 – 2012-08-21 (×3): 3 g via INTRAVENOUS
  Filled 2012-08-20 (×5): qty 3

## 2012-08-20 MED ORDER — ROCURONIUM BROMIDE 50 MG/5ML IV SOLN
50.0000 mg | Freq: Once | INTRAVENOUS | Status: AC
  Administered 2012-08-20: 50 mg via INTRAVENOUS

## 2012-08-20 MED ORDER — SODIUM CHLORIDE 0.9 % IV BOLUS (SEPSIS)
1000.0000 mL | Freq: Once | INTRAVENOUS | Status: AC
  Administered 2012-08-20: 1000 mL via INTRAVENOUS

## 2012-08-20 MED ORDER — DEXMEDETOMIDINE HCL IN NACL 200 MCG/50ML IV SOLN
0.2000 ug/kg/h | INTRAVENOUS | Status: DC
  Administered 2012-08-20 – 2012-08-21 (×3): 0.4 ug/kg/h via INTRAVENOUS
  Filled 2012-08-20 (×4): qty 50

## 2012-08-20 MED ORDER — CHLORHEXIDINE GLUCONATE 0.12 % MT SOLN
OROMUCOSAL | Status: AC
  Administered 2012-08-20: 09:00:00
  Filled 2012-08-20: qty 15

## 2012-08-20 MED ORDER — INSULIN ASPART 100 UNIT/ML ~~LOC~~ SOLN
0.0000 [IU] | SUBCUTANEOUS | Status: DC
  Administered 2012-08-20 (×2): 2 [IU] via SUBCUTANEOUS

## 2012-08-20 MED ORDER — SODIUM CHLORIDE 0.9 % IV SOLN
INTRAVENOUS | Status: DC
  Administered 2012-08-20: 125 mL/h via INTRAVENOUS

## 2012-08-20 MED ORDER — DEXTROSE 5 % IV SOLN
2.0000 g | Freq: Two times a day (BID) | INTRAVENOUS | Status: DC
  Administered 2012-08-20 – 2012-08-21 (×3): 2 g via INTRAVENOUS
  Filled 2012-08-20 (×4): qty 2

## 2012-08-20 MED ORDER — SODIUM CHLORIDE 0.9 % IV BOLUS (SEPSIS)
500.0000 mL | Freq: Once | INTRAVENOUS | Status: AC
  Administered 2012-08-20: 500 mL via INTRAVENOUS

## 2012-08-20 MED ORDER — INSULIN ASPART 100 UNIT/ML ~~LOC~~ SOLN
0.0000 [IU] | SUBCUTANEOUS | Status: DC
  Administered 2012-08-20: 5 [IU] via SUBCUTANEOUS
  Administered 2012-08-21: 3 [IU] via SUBCUTANEOUS
  Administered 2012-08-21 (×3): 2 [IU] via SUBCUTANEOUS
  Administered 2012-08-21 – 2012-08-22 (×3): 3 [IU] via SUBCUTANEOUS
  Administered 2012-08-22: 2 [IU] via SUBCUTANEOUS
  Administered 2012-08-22 – 2012-08-23 (×2): 3 [IU] via SUBCUTANEOUS
  Administered 2012-08-23: 5 [IU] via SUBCUTANEOUS
  Administered 2012-08-23 (×3): 3 [IU] via SUBCUTANEOUS
  Administered 2012-08-23: 2 [IU] via SUBCUTANEOUS
  Administered 2012-08-24: 8 [IU] via SUBCUTANEOUS
  Administered 2012-08-24 (×2): 5 [IU] via SUBCUTANEOUS
  Administered 2012-08-24: 3 [IU] via SUBCUTANEOUS
  Administered 2012-08-24: 5 [IU] via SUBCUTANEOUS
  Administered 2012-08-24: 11 [IU] via SUBCUTANEOUS
  Administered 2012-08-25: 8 [IU] via SUBCUTANEOUS
  Administered 2012-08-25: 5 [IU] via SUBCUTANEOUS
  Administered 2012-08-25: 8 [IU] via SUBCUTANEOUS
  Administered 2012-08-25: 5 [IU] via SUBCUTANEOUS
  Administered 2012-08-25: 3 [IU] via SUBCUTANEOUS

## 2012-08-20 MED ORDER — FENTANYL CITRATE 0.05 MG/ML IJ SOLN
INTRAMUSCULAR | Status: AC
  Administered 2012-08-20: 50 ug via INTRAVENOUS
  Filled 2012-08-20: qty 4

## 2012-08-20 MED ORDER — DEXTROSE 50 % IV SOLN
INTRAVENOUS | Status: AC
  Administered 2012-08-20: 50 mL via INTRAVENOUS
  Filled 2012-08-20: qty 50

## 2012-08-20 MED ORDER — PIPERACILLIN-TAZOBACTAM 3.375 G IVPB
3.3750 g | Freq: Three times a day (TID) | INTRAVENOUS | Status: DC
  Administered 2012-08-20: 3.375 g via INTRAVENOUS
  Filled 2012-08-20 (×2): qty 50

## 2012-08-20 MED ORDER — DEXAMETHASONE SODIUM PHOSPHATE 10 MG/ML IJ SOLN
8.0000 mg | Freq: Four times a day (QID) | INTRAMUSCULAR | Status: DC
  Filled 2012-08-20 (×5): qty 0.8

## 2012-08-20 MED ORDER — DEXTROSE 5 % IV SOLN
500.0000 mg | Freq: Two times a day (BID) | INTRAVENOUS | Status: DC
  Administered 2012-08-20 – 2012-08-21 (×3): 500 mg via INTRAVENOUS
  Filled 2012-08-20 (×4): qty 10

## 2012-08-20 MED ORDER — BIOTENE DRY MOUTH MT LIQD
15.0000 mL | Freq: Four times a day (QID) | OROMUCOSAL | Status: DC
  Administered 2012-08-20 – 2012-08-27 (×26): 15 mL via OROMUCOSAL

## 2012-08-20 MED ORDER — VANCOMYCIN HCL IN DEXTROSE 1-5 GM/200ML-% IV SOLN
1000.0000 mg | INTRAVENOUS | Status: DC
  Administered 2012-08-20: 1000 mg via INTRAVENOUS
  Filled 2012-08-20: qty 200

## 2012-08-20 MED ORDER — ETOMIDATE 2 MG/ML IV SOLN
20.0000 mg | Freq: Once | INTRAVENOUS | Status: AC
  Administered 2012-08-20: 20 mg via INTRAVENOUS

## 2012-08-20 NOTE — Progress Notes (Signed)
ANTIBIOTIC CONSULT NOTE - INITIAL  Pharmacy Consult for Vancomycin/Unasyn/Rocephin/Acylcovir Indication: R/O meningitis  Allergies  Allergen Reactions  . Barbiturates     Patient Measurements: Height: 5\' 5"  (165.1 cm) Weight: 109 lb 12.6 oz (49.8 kg) IBW/kg (Calculated) : 57   Vital Signs: Temp: 99.3 F (37.4 C) (10/18 0400) Temp src: Axillary (10/18 0400) BP: 117/44 mmHg (10/18 0700) Pulse Rate: 89  (10/18 0700) Intake/Output from previous day: 10/17 0701 - 10/18 0700 In: 4290 [I.V.:1610; IV Piggyback:2280] Out: 2200 [Urine:2200] Intake/Output from this shift:    Labs:  [facility] 08/20/12 0420 08/19/12 2214 08/18/12 1818  WBC 15.8* 14.4* 11.0*  HGB 12.1 13.5 12.6  PLT 245 204 270  LABCREA -- -- --  CREATININE 1.22* 1.23* 1.34*   Estimated Creatinine Clearance: 33.3 ml/min (by C-G formula based on Cr of 1.22). No results found for this basename: VANCOTROUGH:2,VANCOPEAK:2,VANCORANDOM:2,GENTTROUGH:2,GENTPEAK:2,GENTRANDOM:2,TOBRATROUGH:2,TOBRAPEAK:2,TOBRARND:2,AMIKACINPEAK:2,AMIKACINTROU:2,AMIKACIN:2, in the last 72 hours   Microbiology: Recent Results (from the past 720 hour(s))  URINE CULTURE     Status: Normal   Collection Time   07/26/12  4:29 AM      Component Value Range Status Comment   Specimen Description URINE, RANDOM   Final    Special Requests NONE   Final    Culture  Setup Time 07/25/2012 05:18   Final    Colony Count NO GROWTH   Final    Culture NO GROWTH   Final    Report Status 07/26/2012 FINAL   Final   URINE CULTURE     Status: Normal   Collection Time   08/16/12  4:14 PM      Component Value Range Status Comment   Colony Count NO GROWTH   Final    Organism ID, Bacteria NO GROWTH   Final   MRSA PCR SCREENING     Status: Normal   Collection Time   08/19/12  1:33 AM      Component Value Range Status Comment   MRSA by PCR NEGATIVE  NEGATIVE Final     Medical History: Past Medical History  Diagnosis Date  . Diabetes mellitus   . GERD  (gastroesophageal reflux disease)     Medications:  Prescriptions prior to admission  Medication Sig Dispense Refill  . insulin aspart (NOVOLOG) 100 UNIT/ML injection 0-9 Units, Subcutaneous, 3 times daily with meals CBG < 70: implement hypoglycemia protocol, Call MD  CBG 70 - 120: 0 units, CBG 121 - 150: 1 unit, CBG 151 - 200: 2 units, CBG 201 - 250: 3 units, CBG 251 - 300: 5 units, CBG 301 - 350: 7 units, CBG 351 - 400: 9 units, CBG > 400: call MD  1 vial  0  . insulin detemir (LEVEMIR) 100 UNIT/ML injection Inject 15 Units into the skin daily with breakfast.      . metFORMIN (GLUCOPHAGE-XR) 500 MG 24 hr tablet Take 500 mg by mouth daily with breakfast.      . metoprolol tartrate (LOPRESSOR) 25 MG tablet Take 12.5 mg by mouth 2 (two) times daily.      . Multiple Vitamin (MULTIVITAMIN WITH MINERALS) TABS Take 1 tablet by mouth daily.      Marland Kitchen omeprazole (PRILOSEC) 40 MG capsule Take 40 mg by mouth daily.      . vitamin C (ASCORBIC ACID) 500 MG tablet Take 500 mg by mouth daily.       Assessment: 71 yo female with possible meningitis for empiric antibiotics  Goal of Therapy:  Vancomycin trough 15-20  Plan:  Vancomycin  1 g IV q48h Rocephin 2 g IV q12h Unasyn 3 g IV q8h Acyclovir 500 mg IV q12h  Eddie Candle 08/20/2012,7:29 AM

## 2012-08-20 NOTE — Consult Note (Signed)
Name: [name] [name] MRN: [id number] DOB: 1941/02/17    LOS: 2  Referring Provider:  [facility] Reason for Referral:  AMS in setting of altered mental status  PULMONARY / CRITICAL CARE MEDICINE  Brief patient description:  71 y/o female was admitted on 10/17 to [facility] with a subacute intracranial bleed noted on an outpatient MRI ordered for headache.  Within 24 hours of admission she developed fever, seizure, and ongoing confusion. Resp failure , ET 10/18.  Events Since Admission: 10/17 CT head R posterior temporoparietal intraparenchymal hemorrhage  Without significant change 10/17 CT head R posterior temporoparietal intraparenchymal hemorrhage 1.3 x 2.1 cm 10/15 MRI brain 1.3x2.2x1.4cm R posterior temporoparietal intraparenchymal hemorrhage consistent with an event occuring one week ago 10/18- resp failure, intubation emergent, fever  Current Status: Sedated for ett  Vital Signs: Temp:  [98.3 F (36.8 C)-103.1 F (39.5 C)] 99.3 F (37.4 C) (10/18 0400) Pulse Rate:  [82-139] 89  (10/18 0700) Resp:  [10-42] 42  (10/18 0700) BP: (90-173)/(40-95) 117/44 mmHg (10/18 0700) SpO2:  [96 %-100 %] 100 % (10/18 0700) Weight:  [49.8 kg (109 lb 12.6 oz)] 49.8 kg (109 lb 12.6 oz) (10/18 0000)  Physical Examination: Gen: just intubated, sedated HEENT: long neck PULM: coarse bilat CV: s1 s2 Tachy, gallop  AB: BS+, soft, nontender, no hsm Ext: warm, no edema, no clubbing, no cyanosis Derm: no rash or skin breakdown Neuro: perrl , sedated for ett   Principal Problem:  *Hemorrhage in the brain-13 x 22 x 14 mm right posterior temporo-occipital intra-axial acute Active Problems:  GERD (gastroesophageal reflux disease)  DM type 2 (diabetes mellitus, type 2)  Dysphagia  HTN (hypertension)  CKD (chronic kidney disease) stage 3, GFR 30-59 ml/min  Leukocytosis  Left hip pain  Left hemiparesis  Hemianopia, homonymous, left  Hemisensory deficit, left  Nausea  Headache  Encephalopathy  acute   ASSESSMENT AND PLAN  NEUROLOGIC  A:   Sub acute intraparenchymal hemorrhage, likely occurred one week prior to admission based on imaging; stable with multiple images today Ongoing encephalopathy of uncertain etiology, ddx post ictal state, non-convulsive status epilepticus, delirium from fever, ativan related; Neurology treating for non-convulsive status but feel this is less likely. 10/18-worsening agitation with fevers,r/o enceph, meningitis (unlikely) r/o seizure related, drugs, extension bleed? Edema now?  P:   -STAT ct head after airway -keppra/dilantin per neurology, levels per neuro, consider -repeat EEG and imaging per neurology -BP control with labetalol, Goal SBP < 140-160 -considering acyclovir, meningitis -empiric decadron until edema on lesion re asessed -HOB elevated -will d/w neuro LP utility -low glu  Noted, add d5 after d50 -fever control  PULMONARY  Lab 08/20/12 0015  PHART 7.400  PCO2ART 40.4  PO2ART 99.3  HCO3 24.5*  O2SAT 97.2   Ventilator Settings:   CXR:  Emphysema and fibrosis, no infiltrate ETT:  n/a  A:   Acute resp failure, intubation P:   -required emergent intubation -abg on PRVC TV 8 cc/kg -pcxr stat -BDer, for copd?  CARDIOVASCULAR  Lab 08/20/12 0430  TROPONINI <0.30  LATICACIDVEN 3.3*  PROBNP --   ECG:  Sinus tach, RBBB, nonspecific st wave changes Lines: peripheral IV  A: Sinus tach, with fever, SIRS?  Volume deplete from poor po intake? P:  -to d5 -UA neg -some volume, fever with insensible losses  RENAL  Lab 08/20/12 0420 08/19/12 2214 08/18/12 1818 08/16/12 1614 08/14/12 1436  NA 145 143 138 135 134*  K 4.4 4.7 -- -- --  CL 107 103 99  101 97  CO2 27 26 27 25 26   BUN 19 22 35* 33* 38*  CREATININE 1.22* 1.23* 1.34* 1.34* 1.45*  CALCIUM 8.7 9.4 9.3 9.0 9.0  MG -- -- -- -- --  PHOS -- -- -- -- --   Intake/Output      10/17 0701 - 10/18 0700 10/18 0701 - 10/19 0700   I.V. (mL/kg) 1610 (32.3)    Other  400    IV Piggyback 2280    Total Intake(mL/kg) 4290 (86.1)    Urine (mL/kg/hr) 2200 (1.8)    Total Output 2200    Net +2090          Foley:  n/a  A:  CKD, Cr near baseline P:   -fluids, avoid free water until new head CT noted -repeat BMET in AM  GASTROINTESTINAL  Lab 08/20/12 0430 08/16/12 1614 08/14/12 1436  AST 51* 24 25  ALT 29 15 17   ALKPHOS 85 105 137*  BILITOT 0.5 0.3 0.3  PROT 6.5 6.2 6.7  ALBUMIN 3.2* 3.7 3.8    A:  [name] low albumin, sepsis related P:   NPO pepcid  HEMATOLOGIC  Lab 08/20/12 0420 08/19/12 2214 08/18/12 1818 08/16/12 1615 08/14/12 1507  HGB 12.1 13.5 12.6 12.4 13.3  HCT 36.8 40.6 38.0 41.6 43.6  PLT 245 204 270 -- --  INR -- -- 0.93 -- --  APTT -- -- -- -- --   A: DVt prvention P:  -monitor CBC -scd -coags in am in case lp required  INFECTIOUS  Lab 08/20/12 0430 08/20/12 0420 08/19/12 2214 08/18/12 1818 08/16/12 1615 08/14/12 1507  WBC -- 15.8* 14.4* 11.0* 9.2 11.4*  PROCALCITON <0.10 -- -- -- -- --   Cultures: 10/17 blood >> 10/17 urine >> 10/18 BC>>> 10/18 sputum>>>  Antibiotics: Amp 10/18>>> vanc 10/18>>> Acyclovir 10/18>>> Ceftriaxone 10/18>>>   A:  Fever 103, changein MS, r/o meningitis./enceph in transition upon admission P:   -low likelihood meningitis/ enceph, empiric abx, neuro rec hold off lp for now -BC, sputum -pcxr no defined infiltrate, UA neg   ENDOCRINE  Lab 08/20/12 0330 08/20/12 0109 08/19/12 2242 08/19/12 1918 08/19/12 1810  GLUCAP 157* 191* 171* 139* 141*   A:  DM2, hypoglycemic event P:   -SSI dc -add d5 to fluids, recheck after d50 -cortisol if drop BP  BEST PRACTICE / DISPOSITION Level of Care:  ICU Primary Service:  PCCM Consultants:  Neurology Code Status:  full Diet:  npo DVT Px:  scd GI Px:  pepcid Skin Integrity:  Normal on admission Social / Family:  Updated husband at bedside by Dr Valetta Fuller  Ccm time 30 min   Nelda Bucks., M.D. Pulmonary and Critical Care  Medicine [facility] Pager: [phone]  08/20/2012, 7:14 AM

## 2012-08-20 NOTE — Progress Notes (Signed)
Nursing: Patient transported to 3103 after report given to [facility]. Patient's medications and personal belongs accompanied her to her new room. Husband shown the waiting area and nursing staff made aware of his location.

## 2012-08-20 NOTE — Progress Notes (Signed)
Hypoglycemic Event  CBG: 51  Treatment: D50 IV 50 mL  Symptoms: None  Follow-up CBG: Time:0800 CBG Result:103 Possible Reasons for Event: Unknown  Comments/MD notified: Dr Lamonte Sakai, Nelda Bucks  Remember to initiate Hypoglycemia Order Set & complete

## 2012-08-20 NOTE — Progress Notes (Signed)
UR completed 

## 2012-08-20 NOTE — Progress Notes (Addendum)
Stroke Team Progress Note  HISTORY  [name] [name] is an 71 y.o. female who was in her normal state of health until 10 days ago at which point she complained of headache and nausea and vomiting. Subsequently, 3 days later her headache got worse and she started becoming confused. For the past 7 days, she has been stable, but not getting any better.   Currently, her husband states that she runs into things on the left, stumbling, and just not seeming like herself. It does not wax and wane. She also may have some trouble with her vision.   Of note, she was told to take aspirin every 6 hours along with Tylenol and has been doing so for the past 2 days. She was not taking regular aspirin prior to her onset of symptoms.   Patient was not a TPA candidate secondary to hemorrhage. She was admitted to the ICU for further evaluation and treatment.  SUBJECTIVE Her husband and daughter are at the bedside.  New seizures yesterday afternoon - focal, pt awake. Started on Keppra.  OBJECTIVE Most recent Vital Signs: Filed Vitals:   08/20/12 0600 08/20/12 0700 08/20/12 0800 08/20/12 0815  BP: 131/48 117/44  166/72  Pulse: 100 89 100   Temp:   95.5 F (35.3 C)   TempSrc:   Rectal   Resp: 24 42 18   Height:      Weight:      SpO2: 99% 100% 100%    CBG (last 3)   Basename 08/20/12 0330 08/20/12 0109 08/19/12 2242  GLUCAP 157* 191* 171*    IV Fluid Intake:      . sodium chloride 50 mL/hr at 08/20/12 0900  . dextrose 5 % and 0.9% NaCl 75 mL/hr at 08/19/12 1700  . DISCONTD: sodium chloride 50 mL/hr at 08/19/12 1009  . DISCONTD: sodium chloride 125 mL/hr (08/20/12 0031)  . DISCONTD: dextrose 5 % and 0.9% NaCl 50 mL/hr at 08/19/12 0847    MEDICATIONS     . acyclovir  500 mg Intravenous Q12H  . ampicillin-sulbactam (UNASYN) IV  3 g Intravenous Q8H  . cefTRIAXone (ROCEPHIN)  IV  2 g Intravenous Q12H  . chlorhexidine      . etomidate  20 mg Intravenous Once  . famotidine (PEPCID) IV  20 mg  Intravenous Q12H  . fosPHENYtoin (CEREBYX) IV  1,000 mg PE Intravenous Once  . levetiracetam  1,000 mg Intravenous STAT  . levetiracetam  500 mg Intravenous BID  . metoprolol tartrate  12.5 mg Oral BID  . rocuronium  50 mg Intravenous Once  . sodium chloride  1,000 mL Intravenous Once  . sodium chloride  1,000 mL Intravenous Once  . vancomycin  1,000 mg Intravenous Q48H  . DISCONTD: dexamethasone  8 mg Intravenous Q6H  . DISCONTD: enoxaparin (LOVENOX) injection  30 mg Subcutaneous Q24H  . DISCONTD: famotidine (PEPCID) IV  10 mg Intravenous Q24H  . DISCONTD: insulin aspart  0-9 Units Subcutaneous Q4H  . DISCONTD: insulin aspart  0-9 Units Subcutaneous Q4H  . DISCONTD: levetiracetam  1,000 mg Intravenous BID  . DISCONTD: levetiracetam  500 mg Intravenous Q12H  . DISCONTD: piperacillin-tazobactam (ZOSYN)  IV  3.375 g Intravenous Q8H  . DISCONTD: sodium chloride  3 mL Intravenous Q12H   PRN:  acetaminophen, acetaminophen-codeine, dextrose, labetalol, ondansetron (ZOFRAN) IV, ondansetron, DISCONTD: LORazepam  Diet:  NPO  Activity:   Up with assistance DVT Prophylaxis:  SCDs   CLINICALLY SIGNIFICANT STUDIES Basic Metabolic Panel:   Lab 08/20/12 0420  08/19/12 2214  NA 145 143  K 4.4 4.7  CL 107 103  CO2 27 26  GLUCOSE 97 184*  BUN 19 22  CREATININE 1.22* 1.23*  CALCIUM 8.7 9.4  MG -- --  PHOS -- --   Liver Function Tests:   Lab 08/20/12 0430 08/16/12 1614  AST 51* 24  ALT 29 15  ALKPHOS 85 105  BILITOT 0.5 0.3  PROT 6.5 6.2  ALBUMIN 3.2* 3.7   CBC:   Lab 08/20/12 0420 08/19/12 2214 08/18/12 1818  WBC 15.8* 14.4* --  NEUTROABS -- -- 6.5  HGB 12.1 13.5 --  HCT 36.8 40.6 --  MCV 95.6 96.2 --  PLT 245 204 --   Coagulation:   Lab 08/18/12 1818  LABPROT 12.4  INR 0.93   Cardiac Enzymes:   Lab 08/20/12 0430  CKTOTAL --  CKMB --  CKMBINDEX --  TROPONINI <0.30   Urinalysis:   Lab 08/19/12 2150 08/18/12 2129  COLORURINE YELLOW YELLOW  LABSPEC 1.011 1.013    PHURINE 6.5 6.0  GLUCOSEU NEGATIVE 100*  HGBUR TRACE* NEGATIVE  BILIRUBINUR NEGATIVE NEGATIVE  KETONESUR 15* NEGATIVE  PROTEINUR NEGATIVE NEGATIVE  UROBILINOGEN 1.0 0.2  NITRITE NEGATIVE NEGATIVE  LEUKOCYTESUR NEGATIVE NEGATIVE   Lipid Panel No results found for this basename: chol,  trig,  hdl,  cholhdl,  vldl,  ldlcalc   HgbA1C  Lab Results  Component Value Date   HGBA1C 7.4* 08/19/2012    Urine Drug Screen:   No results found for this basename: labopia,  cocainscrnur,  labbenz,  amphetmu,  thcu,  labbarb    Alcohol Level: No results found for this basename: ETH:2 in the last 168 hours   CT of the brain   08/20/2012  1.  Stable appearance of the brain, with acute parenchymal hemorrhage in the right occipital lobe just below the occipital horn of the right lateral ventricle.      08/19/2012  Right posterior temporoparietal intraparenchymal hemorrhage without significant change.  Underlying cerebral atrophy and small vessel ischemic changes.    08/19/2012  Stable right posterior temporo-occipital hemorrhage.   MRI of the brain  08/18/2012 13 x 22 x 14 mm right posterior temporo-occipital intra-axial acute and subacute hemorrhage consistent with an event occurring 1 week ago.  The appearance is nonspecific and could represent hemorrhage is secondary to hypertension, anticoagulation, occult trauma, venous angioma or cavernoma (OCVM), pial AVM, hemorrhagic metastasis, or idiopathic.  Continued surveillance is warranted.   MRA of the brain   2D Echocardiogram    Carotid Doppler    CXR 08/20/2012  Endotracheal tube tip projects 2.6 cm proximal to the carina. Otherwise, no interval change.   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  No evidence of active pulmonary disease.    08/18/2012 No acute cardiopulmonary process.    EKG  normal sinus rhythm, PAC's noted.   Therapy Recommendations PT - ; OT - ; ST -   Physical Exam pleasant elderly Caucasian lady currently  not in distress.Awake alert. Afebrile. Head is nontraumatic. Neck is supple without bruit. Hearing is normal. Cardiac exam no murmur or gallop. Lungs are clear to auscultation. Distal pulses are well felt.  Neurological Exam : patient is intubated. She is sedated. She is restless. Pupils are 2 mm equal reactive. Doll's eye movements are sluggish. Corneal reflexes are present. She appears to move all 4 extremities to painful stimuli right side more than left. Deep tendon flexes are preserved. Both plantars show withdrawal response.  ASSESSMENT [name] is a 71 y.o. female presenting with headache and nausea. Imaging confirms a right posterior temporo-occipital intra-axial hemorrhage. Hemorrhage most likely secondary to hypertension and frequent ingestion of aspirin (new antihypertensives started within the past 1 mo with recent q6h aspirin), less likely amyloid angiopathy. Work up underway. On aspirin q 6 hr for headache prior to admission. Patient with resultant headache, nausea, left hip pain, left hemiparesis, left field cut, left hemisensory deficit, dysphagia.   New focal seizures 10/17 followed by sleepiness after starting keppra. Dilantin added for presumed silent seizures. Respiratory status deteriorated 08/20/2012 am, requiring intubation in setting of elevated temp. Respiratory status deterioration likely due to CNS sedation due to antiepileptics and/or possible aspiration.   Presumed aspiration pneumonia, started on abx  Headache secondary to hemorrhage. On tylenol #3  Diabetes, HgbA1c 7.4  GERD  Left hip pain, recent fall per husband  Creatinine 1.34 Hypertension, new medications added within past 1 mo  Hospital day # 2  TREATMENT/PLAN  Change SCDs to lovenox for optimal VTE prophy. Per guidelines, lovenox optimal pharmacologic treatment in hemorrhage, 24-48 h after ictus  Check  Free and total dilantin level. Will not start maintenance dilantin unless EEG shows  electrographic seizures.  F/u EEG. If does not show seizure will decrease keppra.  Consider tube feedings  D/W Dr Tyson Alias, husband and daughter at length and answered questions. This patient is critically ill and at significant risk of neurological worsening, death and care requires constant monitoring of vital signs, hemodynamics,respiratory and cardiac monitoring,review of multiple databases, neurological assessment, discussion with family, other specialists and medical decision making of high complexity. I spent 30 minutes of neurocritical care time  in the care of  this patient.   Annie Main, MSN, RN, ANVP-BC, ANP-BC, [facility] Pager: [phone] 08/20/2012 9:19 AM  Scribe for Dr. Delia Heady, Stroke Center Medical Director, who has personally reviewed chart, pertinent data, examined the patient and developed the plan of care. Pager:  [phone]

## 2012-08-20 NOTE — Progress Notes (Signed)
eLink Physician-Brief Progress Note Patient Name: [name] [name] DOB: 1941/01/19 MRN: [id number]  Date of Service  08/20/2012   HPI/Events of Note  Few hours ago temp 103.45F with rising wbc  Now hypotensive  MRSA pcr negative Cultured per RN   eICU Interventions  Check lactate, check pct Start empiric zosyn FLuid bolus reassess   Intervention Category Major Interventions: Hypotension - evaluation and management Intermediate Interventions: Infection - evaluation and management  Annely Sliva 08/20/2012, 4:11 AM

## 2012-08-20 NOTE — Procedures (Signed)
Intubation Procedure Note Ardelle Haliburton [id number] 08/02/1941  Procedure: Intubation Indications: Airway protection and maintenance  Procedure Details Consent: Unable to obtain consent because of emergent medical necessity. Time Out: Verified patient identification, verified procedure, site/side was marked, verified correct patient position, special equipment/implants available, medications/allergies/relevent history reviewed, required imaging and test results available.  Performed  Drugs: Etomidate 20mg , Rocuronium 50mg   DLx2 with MAC and 4; Grade 3 view 7-0 ETT placed (because 7-5 would not pass), placement confirmed with color change, smoke in tube, bilat breath sounds   Evaluation Hemodynamic Status: BP stable throughout; O2 sats: stable throughout Patient's Current Condition: stable Complications: No apparent complications Patient did tolerate procedure well. Chest X-ray ordered to verify placement.  CXR: tube position acceptable.   MCQUAID, DOUGLAS 08/20/2012

## 2012-08-20 NOTE — Progress Notes (Signed)
Portable EEG completed

## 2012-08-20 NOTE — Plan of Care (Signed)
Problem: Phase I Progression Outcomes Goal: Airway maintained/protected Outcome: Completed/Met Date Met:  08/20/12 Pt intubated 08/20/12

## 2012-08-20 NOTE — Progress Notes (Signed)
Have collaborated with this person and note below. 08/20/2012  [place] Bing, PT [phone] (pager)

## 2012-08-20 NOTE — Progress Notes (Addendum)
Pt noted to have hypotension, MD aware of BP 60/33. Orders received for stat Lactic acid level, NS bolus 500 cc, and CVP. Pt's HOB lowered, bolus started, labs drawn, and CVP noted to be 6 increasing to 8 (currently). BP now 115/49. MD paged with results. Orders received for 1 more liter NS bolus in addition to 500 cc currently infusing. Will continue to monitor.   [name] [name]

## 2012-08-20 NOTE — Progress Notes (Signed)
eLink Physician-Brief Progress Note Patient Name: [name] [name] DOB: 1941/10/10 MRN: [id number]  Date of Service  08/20/2012   HPI/Events of Note  Variable  0 Points 1 Point 2 Points Total  Heart rate per minute  <90 beats 90-109 beats >110 beats 1  Respiratory  Rate per minute < 18 breaths 19-30 breaths  >30 breaths 1  Restlessness; nonpurposeful movements None  occas slight movement Frequent movement 1  Paradoxical breathing pattern: None  Present 2  Accessory muscle use: rise in clavicle during inspiration None Slight rise Pronounced rise 1  Grunting at end-expiration: guttural sound None  Present 2  Nasal flaring: involuntary movement of nares None  Present 1  Look of fear None  Eyes wide 0  Overall total out of 16    9   Moderate REsp Distress  Respiratory Distress Observation Scale Journal of Palliative Medicine Vol. 13, Number 3, 2010 Campbell et al. Page. 285-290    eICU Interventions  Check ABG Check CXR   Intervention Category Major Interventions: Other:  Rakesha Dalporto 08/20/2012, 6:45 AM

## 2012-08-20 NOTE — Progress Notes (Signed)
Hypoglycemic Event  CBG: 26   Treatment: D50 IV 50 mL at 07:33  Symptoms: None  Follow-up CBG: Time: 07:50 CBG Result: 131  Possible Reasons for Event: Unknown  Comments/MD notified: Dr. Lindalou Hose, Marlane Mingle  Remember to initiate Hypoglycemia Order Set & complete

## 2012-08-20 NOTE — Progress Notes (Signed)
INITIAL ADULT NUTRITION ASSESSMENT Date: 08/20/2012   Time: 8:25 AM Reason for Assessment: VDRF  Pt meets criteria for severe malnutrition in the context of acute injury (new hemorrhage) as evidenced by 4% weight loss x 2 weeks, severe wasting of temples and estimated intake </= 50% of needs for >/= 5 days.   INTERVENTION:  If pt remains intubated recommend Initiate Oxepa (if pt with Sepsis) @ 10 ml/hr via OGT and increase by 10 ml every 12 hours to goal rate of 30 ml/hr. 30 ml Prostat BID.  At goal rate, tube feeding regimen will provide 1280 kcal (103% of needs), 75 grams of protein (>100% of needs), and 565 ml of H2O.   Recommend slow advancement of enteral nutrition due to risk of refeeding syndrome.  Recommend checking Phosphorus and Magnesium daily x 3 days once TF started, to monitor for refeeding syndrome.   DOCUMENTATION CODES Per approved criteria  -Severe malnutrition in the context of acute illness or injury -Underweight    ASSESSMENT: Female 71 y.o.  Dx: Hemorrhage in the brain  Hx:  Past Medical History  Diagnosis Date  . Diabetes mellitus   . GERD (gastroesophageal reflux disease)    History reviewed. No pertinent past surgical history.  Related Meds:  Scheduled Meds:   . acyclovir  500 mg Intravenous Q12H  . ampicillin-sulbactam (UNASYN) IV  3 g Intravenous Q8H  . cefTRIAXone (ROCEPHIN)  IV  2 g Intravenous Q12H  . etomidate  20 mg Intravenous Once  . famotidine (PEPCID) IV  20 mg Intravenous Q12H  . fosPHENYtoin (CEREBYX) IV  1,000 mg PE Intravenous Once  . levetiracetam  1,000 mg Intravenous STAT  . levetiracetam  500 mg Intravenous BID  . metoprolol tartrate  12.5 mg Oral BID  . rocuronium  50 mg Intravenous Once  . sodium chloride  1,000 mL Intravenous Once  . sodium chloride  1,000 mL Intravenous Once  . vancomycin  1,000 mg Intravenous Q48H  . DISCONTD: dexamethasone  8 mg Intravenous Q6H  . DISCONTD: enoxaparin (LOVENOX) injection  30 mg  Subcutaneous Q24H  . DISCONTD: famotidine (PEPCID) IV  10 mg Intravenous Q24H  . DISCONTD: insulin aspart  0-9 Units Subcutaneous Q4H  . DISCONTD: insulin aspart  0-9 Units Subcutaneous Q4H  . DISCONTD: levetiracetam  1,000 mg Intravenous BID  . DISCONTD: levetiracetam  500 mg Intravenous Q12H  . DISCONTD: piperacillin-tazobactam (ZOSYN)  IV  3.375 g Intravenous Q8H  . DISCONTD: sodium chloride  3 mL Intravenous Q12H   Continuous Infusions:   . sodium chloride    . dextrose 5 % and 0.9% NaCl 75 mL/hr at 08/19/12 1700  . DISCONTD: sodium chloride 50 mL/hr at 08/19/12 1009  . DISCONTD: sodium chloride 125 mL/hr (08/20/12 0031)  . DISCONTD: dextrose 5 % and 0.9% NaCl 50 mL/hr at 08/19/12 0847   PRN Meds:.acetaminophen, acetaminophen-codeine, dextrose, labetalol, ondansetron (ZOFRAN) IV, ondansetron, DISCONTD: LORazepam   Ht: 5\' 5"  (165.1 cm)  Wt: 109 lb 12.6 oz (49.8 kg)  Ideal Wt: 56.8 kg % Ideal Wt: 88%  Usual Wt:  Wt Readings from Last 10 Encounters:  08/20/12 109 lb 12.6 oz (49.8 kg)  08/16/12 114 lb (51.71 kg)  08/14/12 112 lb (50.803 kg)  07/28/12 113 lb 1.5 oz (51.3 kg)   % Usual Wt: 96%  Body mass index is 18.27 kg/(m^2). Underweight  Food/Nutrition Related Hx: Husband reports weight loss due to nausea PTA.  Labs:  CMP     Component Value Date/Time   NA  145 08/20/2012 0420   K 4.4 08/20/2012 0420   CL 107 08/20/2012 0420   CO2 27 08/20/2012 0420   GLUCOSE 97 08/20/2012 0420   BUN 19 08/20/2012 0420   CREATININE 1.22* 08/20/2012 0420   CREATININE 1.34* 08/16/2012 1614   CALCIUM 8.7 08/20/2012 0420   PROT 6.5 08/20/2012 0430   ALBUMIN 3.2* 08/20/2012 0430   AST 51* 08/20/2012 0430   ALT 29 08/20/2012 0430   ALKPHOS 85 08/20/2012 0430   BILITOT 0.5 08/20/2012 0430   GFRNONAA 43* 08/20/2012 0420   GFRAA 50* 08/20/2012 0420   CBG (last 3)   Basename 08/20/12 0330 08/20/12 0109 08/19/12 2242  GLUCAP 157* 191* 171*   Lab Results  Component Value Date     HGBA1C 7.4* 08/19/2012    Sodium  Date/Time Value Range Status  08/20/2012  4:20 AM 145  135 - 145 mEq/L Final  08/19/2012 10:14 PM 143  135 - 145 mEq/L Final  08/18/2012  6:18 PM 138  135 - 145 mEq/L Final    Potassium  Date/Time Value Range Status  08/20/2012  4:20 AM 4.4  3.5 - 5.1 mEq/L Final  08/19/2012 10:14 PM 4.7  3.5 - 5.1 mEq/L Final  08/18/2012  6:18 PM 4.5  3.5 - 5.1 mEq/L Final    No results found for this basename: phos    Magnesium  Date/Time Value Range Status  07/26/2012  8:28 AM 1.4* 1.5 - 2.5 mg/dL Final    Intake/Output Summary (Last 24 hours) at 08/20/12 0827 Last data filed at 08/20/12 0600  Gross per 24 hour  Intake   4215 ml  Output   2200 ml  Net   2015 ml     Diet Order: NPO  Supplements/Tube Feeding: none  IVF:    sodium chloride   dextrose 5 % and 0.9% NaCl Last Rate: 75 mL/hr at 08/19/12 1700  DISCONTD: sodium chloride Last Rate: 50 mL/hr at 08/19/12 1009  DISCONTD: sodium chloride Last Rate: 125 mL/hr (08/20/12 0031)  DISCONTD: dextrose 5 % and 0.9% NaCl Last Rate: 50 mL/hr at 08/19/12 0847   Patient is currently intubated on ventilator support. Pt with R posterior temporoparietal intraparenchymal hemorrhage consistent with an event occuring one week ago. Pt with respiratory failure and emergent intubation. +fevers, MD note questions SIRS. Per RN pt with fevers and low BP since intubated.  MV: 5.9 Temp:Temp (24hrs), Avg:99.9 F (37.7 C), Min:95.5 F (35.3 C), Max:103.1 F (39.5 C) Per husband pt with 4% weight loss in the last 2 weeks due to headache and nausea. Per husband pt had not been eating, had only been able to tolerate some peanut butter and crackers. Pt with visible wasting of the temples.  Pt meets criteria for severe malnutrition in the context of acute injury (new hemorrhage) as evidenced by 4% weight loss x 2 weeks, severe wasting of temples and estimated intake </= 50% of needs for >/= 5 days.   Estimated  Nutritional Needs:   Kcal:  1245 Protein:  65-80 grams Fluid:  >1.5 L/day  NUTRITION DIAGNOSIS: Inadequate oral intake r/t inability to eat AEB NPO status.  MONITORING/EVALUATION(Goals): Goal: Pt to meet >/= 90% of their estimated nutrition needs. Monitor: vent status, plan of care, weight, I&O's  EDUCATION NEEDS: -No education needs identified at this time  Kendell Bane RD, LDN, CNSC [phone] Pager [phone] After Hours Pager  08/20/2012, 8:25 AM

## 2012-08-20 NOTE — Progress Notes (Signed)
Pt now intubated. SLP will sign off. Pt with right hemisphere disorder prior to seizure. Will need SLP f/u when ready to participate. Harlon Ditty, MA CCC-SLP [phone]

## 2012-08-20 NOTE — Procedures (Signed)
EEG NUMBER:  [id number].  REFERRING PHYSICIAN:  Pramod P. Pearlean Brownie, MD  INDICATION FOR STUDY:  A 71 year old lady with confusion, nausea and vomiting as well as acute right parietal intracerebral hemorrhage.  The patient suffered a witnessed generalized seizure about 1 hour prior to this study.  DESCRIPTION:  This is a routine EEG recording was performed during wakefulness.  The patient did not respond to verbal commands during the study.  Predominant background activity consisted of mixed, moderate amplitude delta and theta activity with fairly frequent occurrences of higher amplitude diffuse somewhat rhythmic delta activity, which was slightly more prominent involving the right hemisphere compared to the left.  There was also superimposed low-amplitude 20 Hz beta activity recorded primarily from the central head regions.  Photic stimulation produced no appreciable occipital driving response.  Hyperventilation was not performed.  No epileptiform discharges were recorded.  INTERPRETATION:  This EEG showed moderately severe, continuous, nonspecific, generalized slowing of cerebral activity.  This pattern of slowing can be seen with number of different etiologies including degenerative and metabolic encephalopathies as well as postictal state following generalized seizure.  No evidence of ongoing seizure activity was seen.     Noel Christmas, MD    ZO:XWRU D:  08/20/2012 07:54:34  T:  08/20/2012 23:46:50  Job #:  [id number]

## 2012-08-20 NOTE — Procedures (Signed)
PROCEDURE NOTE: CVL PLACEMENT  INDICATION:    Monitoring of central venous pressures and/or administration of medications optimally administered in central vein  CONSENT:   Risks of procedure as well as the alternatives were explained to the patient or surrogate. Consent for procedure obtained. A time out was performed to review patient identification, procedure to be performed, correct patient position, medications/allergies/relevent history, required imaging and test results.  PROCEDURE  Maximum sterile technique was used including antiseptics, cap, gloves, gown, hand hygiene, mask and sheet.  Skin prep: Chlorhexidine; local anesthetic administered  A antimicrobial bonded/coated triple lumen catheter was placed in the right IJ using the Seldinger technique.  Real time ultrasound was used for vessel identification and guidance.   EVALUATION:  Blood flow good  Complications: No apparent complications  Patient tolerated the procedure well.  Chest X-ray ordered to verify placement and is pending  Anders Simmonds, ACNP was present throughout entire procedure.  Amber M. Hairford, M.D. 08/20/2012 10:19 AM   US guidance tolerated sell  Mcarthur Rossetti. Tyson Alias, MD, FACP Pgr: [phone] [facility]

## 2012-08-20 NOTE — Progress Notes (Signed)
OT Cancellation Note  Patient Details Name: Aliah Eriksson MRN: [id number] DOB: [birth year]   Cancelled Treatment:    Reason Eval/Treat Not Completed: Medical issues which prohibited therapy: pt intubated and currently sedated. Will hold at this time and check back as schedule allows. Thank you. Glendale Chard, OTR/L Pager: [phone] 08/20/2012    Cierah Crader 08/20/2012, 11:04 AM

## 2012-08-20 NOTE — Progress Notes (Addendum)
Notified Elink MD of patients breathing changes that were noted. MD used the camera to view pt and orders were received. Patient was set up for intubation, RT was notified.

## 2012-08-21 ENCOUNTER — Inpatient Hospital Stay (HOSPITAL_COMMUNITY): Payer: Medicare Other

## 2012-08-21 LAB — COMPREHENSIVE METABOLIC PANEL
AST: 52 U/L — ABNORMAL HIGH (ref 0–37)
Albumin: 2.3 g/dL — ABNORMAL LOW (ref 3.5–5.2)
Alkaline Phosphatase: 66 U/L (ref 39–117)
CO2: 25 mEq/L (ref 19–32)
Chloride: 103 mEq/L (ref 96–112)
GFR calc non Af Amer: 53 mL/min — ABNORMAL LOW (ref >90)
Potassium: 2.7 mEq/L — CL (ref 3.5–5.1)
Total Bilirubin: 0.3 mg/dL (ref 0.3–1.2)

## 2012-08-21 LAB — PROCALCITONIN: Procalcitonin: <0.1 ng/mL

## 2012-08-21 LAB — URINE CULTURE: Special Requests: NORMAL

## 2012-08-21 LAB — CBC WITH DIFFERENTIAL/PLATELET
Basophils Absolute: 0 10*3/uL (ref 0.0–0.1)
Basophils Relative: 0 % (ref 0–1)
HCT: 29.6 % — ABNORMAL LOW (ref 36.0–46.0)
Hemoglobin: 9.6 g/dL — ABNORMAL LOW (ref 12.0–15.0)
Lymphocytes Relative: 13 % (ref 12–46)
MCHC: 32.4 g/dL (ref 30.0–36.0)
Monocytes Relative: 10 % (ref 3–12)
Neutro Abs: 8.5 10*3/uL — ABNORMAL HIGH (ref 1.7–7.7)
Neutrophils Relative %: 76 % (ref 43–77)
RDW: 13 % (ref 11.5–15.5)
WBC: 11.2 10*3/uL — ABNORMAL HIGH (ref 4.0–10.5)

## 2012-08-21 LAB — GLUCOSE, CAPILLARY: Glucose-Capillary: 107 mg/dL — ABNORMAL HIGH (ref 70–99)

## 2012-08-21 MED ORDER — METOPROLOL TARTRATE 1 MG/ML IV SOLN
2.5000 mg | INTRAVENOUS | Status: DC | PRN
  Administered 2012-08-21 (×4): 2.5 mg via INTRAVENOUS
  Administered 2012-08-22 – 2012-08-25 (×12): 5 mg via INTRAVENOUS
  Administered 2012-08-25: 2.5 mg via INTRAVENOUS
  Filled 2012-08-21 (×16): qty 5

## 2012-08-21 MED ORDER — RACEPINEPHRINE HCL 2.25 % IN NEBU
0.5000 mL | INHALATION_SOLUTION | RESPIRATORY_TRACT | Status: DC | PRN
  Administered 2012-08-21: 0.5 mL via RESPIRATORY_TRACT
  Filled 2012-08-21: qty 0.5

## 2012-08-21 MED ORDER — SODIUM CHLORIDE 0.9 % IV BOLUS (SEPSIS)
500.0000 mL | Freq: Once | INTRAVENOUS | Status: AC
  Administered 2012-08-21: 500 mL via INTRAVENOUS

## 2012-08-21 MED ORDER — FAMOTIDINE IN NACL 20-0.9 MG/50ML-% IV SOLN: 20.0000 mg | Freq: Two times a day (BID) | INTRAVENOUS | Status: DC

## 2012-08-21 MED ORDER — FENTANYL CITRATE 0.05 MG/ML IJ SOLN
12.5000 ug | INTRAMUSCULAR | Status: DC | PRN
  Administered 2012-08-21: 25 ug via INTRAVENOUS
  Filled 2012-08-21: qty 2

## 2012-08-21 MED ORDER — SODIUM CHLORIDE 0.9 % IV SOLN
500.0000 mg | Freq: Two times a day (BID) | INTRAVENOUS | Status: DC
  Administered 2012-08-21 – 2012-08-22 (×2): 500 mg via INTRAVENOUS
  Filled 2012-08-21 (×3): qty 5

## 2012-08-21 MED ORDER — POTASSIUM CHLORIDE 20 MEQ/15ML (10%) PO LIQD
40.0000 meq | Freq: Once | ORAL | Status: AC
  Administered 2012-08-21: 40 meq
  Filled 2012-08-21: qty 30

## 2012-08-21 MED ORDER — POTASSIUM CHLORIDE 10 MEQ/50ML IV SOLN
10.0000 meq | INTRAVENOUS | Status: AC
  Administered 2012-08-21 (×6): 10 meq via INTRAVENOUS
  Filled 2012-08-21: qty 50
  Filled 2012-08-21: qty 250

## 2012-08-21 MED ORDER — KCL IN DEXTROSE-NACL 40-5-0.45 MEQ/L-%-% IV SOLN
INTRAVENOUS | Status: DC
  Administered 2012-08-21: 75 mL via INTRAVENOUS
  Administered 2012-08-22: 17:00:00 via INTRAVENOUS
  Filled 2012-08-21 (×5): qty 1000

## 2012-08-21 NOTE — Procedures (Signed)
EEG NUMBER:  [id number].  REFERRING PHYSICIAN:  Dr. Ritta Slot.  INDICATION FOR STUDY:  A 71 year old lady, intubated and on ventilator following generalized seizure activity.  Previous study showed moderately severe continuous slowing of cerebral activity with rhythmic delta activity recorded primarily from the frontal regions.  Study is being performed for comparison to rule out seizure activity.  DESCRIPTION:  This is a standard EEG recording performed at the patient's bedside in the intensive care unit.  Predominant background activity consisted of low amplitude 1-2 Hz, diffuse, continuous delta activity with superimposed 20-25 Hz beta activity, which was most prominent in frontal and central regions.  Photic stimulation produced a minimal bilateral occipital driving response.  No epileptiform discharges were recorded.  INTERPRETATION:  This EEG shows mild, generalized, nonspecific, continuous slowing of cerebral activity with evidence of marked improvement compared to previous EEG study on August 19, 2012.  No epileptiform discharges were recorded.     Noel Christmas, MD    UE:AVWU D:  08/20/2012 15:39:51  T:  08/21/2012 06:45:15  Job #:  [id number]

## 2012-08-21 NOTE — Progress Notes (Signed)
PT Cancellation Note  Patient Details Name: Myrakle Wingler MRN: [id number] DOB: Feb 01, 1941   Cancelled Treatment:    Reason Eval/Treat Not Completed: Medical issues which prohibited therapy. Will plan to check on pt 10/21 for appropriateness for PT eval.   Shawnda Mauney 08/21/2012, 8:44 AM Pager [phone]

## 2012-08-21 NOTE — Progress Notes (Signed)
Hypokalemia   K replaced  

## 2012-08-21 NOTE — Progress Notes (Signed)
Stroke Team Progress Note  HISTORY  [name] [name] is an 71 y.o. female who was in her normal state of health until 10 days ago at which point she complained of headache and nausea and vomiting. Subsequently, 3 days later her headache got worse and she started becoming confused. For the past 7 days, she has been stable, but not getting any better.    she runs into things on the left, stumbling, and just not seeming like herself.   Of note, she was told to take aspirin every 6 hours along with Tylenol and has been doing so for the past 2 days. She was not taking regular aspirin prior to her onset of symptoms.   CT head showed stable appearance of the brain, with acute parenchymal  hemorrhage in the right occipital lobe just below the occipital  horn of the right lateral ventricle.   EEG twice, 10/18, moderately severe, continuoues generalized slowing.  Since keppra was given 10/18, 3am.  She also had clinical seizure in 10/18. Repeat 10/19: much improved, mild generalized slowing.\   SUBJECTIVE She remain intubated, on procalcitonin,   OBJECTIVE Most recent Vital Signs: Filed Vitals:   08/21/12 0730 08/21/12 0800 08/21/12 0945 08/21/12 1000  BP: 152/79 158/81 125/76 115/70  Pulse: 117 112 94 104  Temp:  98.4 F (36.9 C)    TempSrc:  Axillary    Resp: 18 20 11 12   Height:      Weight:      SpO2: 100% 100% 100% 100%   CBG (last 3)   Basename 08/21/12 0442 08/20/12 2357 08/20/12 2003  GLUCAP 125* 156* 103*    IV Fluid Intake:      . dexmedetomidine 0.2 mcg/kg/hr (08/21/12 1034)  . dextrose 75 mL/hr at 08/20/12 2031  . DISCONTD: sodium chloride 20 mL/hr at 08/20/12 2031    MEDICATIONS     . acyclovir  500 mg Intravenous Q12H  . ampicillin-sulbactam (UNASYN) IV  3 g Intravenous Q8H  . antiseptic oral rinse  15 mL Mouth Rinse QID  . cefTRIAXone (ROCEPHIN)  IV  2 g Intravenous Q12H  . chlorhexidine  15 mL Mouth Rinse BID  . famotidine (PEPCID) IV  20 mg Intravenous Q12H  .  insulin aspart  0-15 Units Subcutaneous Q4H  . levetiracetam  500 mg Intravenous BID  . metoprolol tartrate  12.5 mg Oral BID  . potassium chloride  10 mEq Intravenous Q1 Hr x 6  . potassium chloride  40 mEq Per Tube Once  . sodium chloride  1,000 mL Intravenous Once  . sodium chloride  500 mL Intravenous Once  . sodium chloride  500 mL Intravenous Once  . vancomycin  1,000 mg Intravenous Q48H   PRN:  acetaminophen, acetaminophen-codeine, dextrose, fentaNYL, labetalol, ondansetron (ZOFRAN) IV, ondansetron  Diet:  NPO  Activity:   Up with assistance DVT Prophylaxis:  SCDs   CLINICALLY SIGNIFICANT STUDIES Basic Metabolic Panel:   Lab 08/21/12 0602 08/20/12 0420  NA 137 145  K 2.7* 4.4  CL 103 107  CO2 25 27  GLUCOSE 135* 97  BUN 10 19  CREATININE 1.03 1.22*  CALCIUM 6.8* 8.7  MG -- --  PHOS -- --   Liver Function Tests:   Lab 08/21/12 0602 08/20/12 0430  AST 52* 51*  ALT 26 29  ALKPHOS 66 85  BILITOT 0.3 0.5  PROT 5.1* 6.5  ALBUMIN 2.3* 3.2*   CBC:   Lab 08/21/12 0602 08/20/12 0420 08/18/12 1818  WBC 11.2* 15.8* --  NEUTROABS 8.5* -- 6.5  HGB 9.6* 12.1 --  HCT 29.6* 36.8 --  MCV 95.2 95.6 --  PLT 173 245 --   Coagulation:   Lab 08/18/12 1818  LABPROT 12.4  INR 0.93   Cardiac Enzymes:   Lab 08/20/12 0430  CKTOTAL --  CKMB --  CKMBINDEX --  TROPONINI <0.30   Urinalysis:   Lab 08/19/12 2150 08/18/12 2129  COLORURINE YELLOW YELLOW  LABSPEC 1.011 1.013  PHURINE 6.5 6.0  GLUCOSEU NEGATIVE 100*  HGBUR TRACE* NEGATIVE  BILIRUBINUR NEGATIVE NEGATIVE  KETONESUR 15* NEGATIVE  PROTEINUR NEGATIVE NEGATIVE  UROBILINOGEN 1.0 0.2  NITRITE NEGATIVE NEGATIVE  LEUKOCYTESUR NEGATIVE NEGATIVE   Lipid Panel No results found for this basename: chol,  trig,  hdl,  cholhdl,  vldl,  ldlcalc   HgbA1C  Lab Results  Component Value Date   HGBA1C 7.4* 08/19/2012    Urine Drug Screen:   No results found for this basename: labopia,  cocainscrnur,  labbenz,   amphetmu,  thcu,  labbarb    Alcohol Level: No results found for this basename: ETH:2 in the last 168 hours   CT of the brain   08/20/2012  1.  Stable appearance of the brain, with acute parenchymal hemorrhage in the right occipital lobe just below the occipital horn of the right lateral ventricle.      08/19/2012  Right posterior temporoparietal intraparenchymal hemorrhage without significant change.  Underlying cerebral atrophy and small vessel ischemic changes.    08/19/2012  Stable right posterior temporo-occipital hemorrhage.   MRI of the brain  08/18/2012 13 x 22 x 14 mm right posterior temporo-occipital intra-axial acute and subacute hemorrhage consistent with an event occurring 1 week ago.  The appearance is nonspecific and could represent hemorrhage is secondary to hypertension, anticoagulation, occult trauma, venous angioma or cavernoma (OCVM), pial AVM, hemorrhagic metastasis, or idiopathic.  Continued surveillance is warranted.     CXR 08/20/2012  Endotracheal tube tip projects 2.6 cm proximal to the carina. Otherwise, no interval change.   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  No evidence of active pulmonary disease.    08/18/2012 No acute cardiopulmonary process.    EKG  normal sinus rhythm, PAC's noted.   Therapy Recommendations PT - ; OT - ; ST -   Physical Exam pleasant elderly Caucasian lady currently not in distress.Awake alert. Afebrile. Head is nontraumatic. Neck is supple without bruit. Hearing is normal. Cardiac exam no murmur or gallop. Lungs are clear to auscultation. Distal pulses are well felt.  Neurological Exam : patient is intubated. On precedex, trying to follow command.    Pupils are 4 mm equal reactive. Doll's eye movements are present.. Corneal reflexes are present. She has bilateral arm spontaneous movement. Deep tendon flexes are preserved. Both plantars show withdrawal response.  ASSESSMENT Ms. Mazal Ebey is a 71 y.o. female presenting with  headache and nausea. Imaging confirms a right posterior temporo-occipital intra-axial hemorrhage. Hemorrhage most likely secondary to hypertension and frequent ingestion of aspirin (new antihypertensives started within the past 1 mo with recent q6h aspirin), On aspirin q 6 hr for headache prior to admission.    New focal seizures 10/17 , EEG showed marked improvement after starting keppra  Respiratory status deteriorated 08/20/2012 am, requiring intubation in setting of elevated temp. Respiratory status deterioration likely due to CNS sedation due to antiepileptics and/or possible aspiration.   Presumed aspiration pneumonia, started on abx  Headache secondary to hemorrhage. On tylenol #3  Diabetes, HgbA1c 7.4  GERD  Left hip pain, recent fall per husband  Creatinine 1.34 Hypertension, new medications added within past 1 mo  Hospital day # 3  TREATMENT/PLAN  Change SCDs to lovenox for optimal VTE prophy. Per guidelines, lovenox optimal pharmacologic treatment in hemorrhage, 24-48 h after ictus  Keep keppra.500mg  bid. Try to extubate her.  This patient is critically ill and at significant risk of neurological worsening, death and care requires constant monitoring of vital signs, hemodynamics,respiratory and cardiac monitoring,review of multiple databases, neurological assessment, discussion with family, other specialists and medical decision making of high complexity. I spent 30 minutes of neurocritical care time  in the care of  this patient.     [facility] Pager: [phone] 08/21/2012 10:36 AM  Scribe for Dr. Delia Heady, Stroke Center Medical Director, who has personally reviewed chart, pertinent data, examined the patient and developed the plan of care. Pager:  [phone]

## 2012-08-21 NOTE — Procedures (Signed)
Extubation Procedure Note  Patient Details:   Name: [name] [name] DOB: July 08, 1941 MRN: [id number]   Evaluation  O2 sats: stable throughout Complications: No apparent complications Patient did tolerate procedure well. Bilateral Breath Sounds: Rhonchi Suctioning: Airway Yes  Order received for extubation.  Spontaneous VT 400, RR 20 prior to extubation.  Cuff leak positive.  Extubated to 3l [place].  Patient tolerated well.  No stridor noted.  Will continue to monitor.  [name] [facility] 08/21/2012, 11:07 AM

## 2012-08-21 NOTE — Progress Notes (Signed)
CRITICAL VALUE ALERT  Critical value received:  K 2.7  Date of notification:  08/21/12  Time of notification:  0656  Critical value read back:yes  Nurse who received alert:  M. Renae Gloss, RN  MD notified (1st page):  [place]  Time of first page:  [phone]  MD notified (2nd page):  Time of second page:  Responding MD:  Frederico Hamman  Time MD responded:  [phone]

## 2012-08-21 NOTE — Consult Note (Signed)
Name: [name] [name] MRN: [id number] DOB: 1940/12/23    LOS: 3  Referring Provider:  [facility] Reason for Referral:  AMS in setting of altered mental status  PULMONARY / CRITICAL CARE MEDICINE  Brief patient description:  71 y/o female was admitted on 10/17 to [facility] with a subacute intracranial bleed noted on an outpatient MRI ordered for headache.  Within 24 hours of admission she developed fever, seizure, and ongoing confusion. Resp failure , ET 10/18.  Events Since Admission: 10/17 CT head R posterior temporoparietal intraparenchymal hemorrhage  Without significant change 10/17 CT head R posterior temporoparietal intraparenchymal hemorrhage 1.3 x 2.1 cm 10/15 MRI brain 1.3x2.2x1.4cm R posterior temporoparietal intraparenchymal hemorrhage consistent with an event occuring one week ago 10/18- resp failure, intubation emergent, fever 10/18 EEG: This EEG showed moderately severe, continuous, nonspecific, generalized slowing of cerebral activity. This pattern of slowing can be seen with number of different etiologies including degenerative and metabolic encephalopathies as well as postictal state following generalized seizure. No evidence of ongoing seizure activity was seen. 10/19 EEG: This EEG shows mild, generalized, nonspecific, continuous slowing of cerebral activity with evidence of marked improvement compared to previous EEG study on August 19, 2012. No epileptiform discharges were recorded.   Current Status: RASS -1. Intermittent agitation. + F/C   Vital Signs: Temp:  [97.1 F (36.2 C)-101.5 F (38.6 C)] 97.7 F (36.5 C) (10/19 0400) Pulse Rate:  [67-135] 117  (10/19 0730) Resp:  [10-24] 18  (10/19 0730) BP: (60-152)/(25-82) 152/79 mmHg (10/19 0730) SpO2:  [99 %-100 %] 100 % (10/19 0730) FiO2 (%):  [30 %-100 %] 30 % (10/19 0730) Weight:  [52.9 kg (116 lb 10 oz)] 52.9 kg (116 lb 10 oz) (10/19 0600)  Physical Examination: Gen: On vent but follows commands HEENT: long neck, rt i j cvl  noted PULM: coarse bilat CV: s1 s2 Tachy,  AB: BS+, soft, nontender, no hsm Ext: warm, no edema, no clubbing, no cyanosis Derm: no rash or skin breakdown Neuro: perrl , @5  mm, follows comands   Principal Problem:  *Hemorrhage in the brain-13 x 22 x 14 mm right posterior temporo-occipital intra-axial acute Active Problems:  GERD (gastroesophageal reflux disease)  DM type 2 (diabetes mellitus, type 2)  Dysphagia  HTN (hypertension)  CKD (chronic kidney disease) stage 3, GFR 30-59 ml/min  Leukocytosis  Left hip pain  Left hemiparesis  Hemianopia, homonymous, left  Hemisensory deficit, left  Nausea  Headache  Encephalopathy acute  Acute respiratory failure with hypoxia  Seizure disorder, grand mal  Seizure    ASSESSMENT AND PLAN  NEUROLOGIC  A:   Sub acute intraparenchymal hemorrhage, likely occurred one week prior to admission based on imaging; stable with multiple images today Ongoing encephalopathy of uncertain etiology, ddx post ictal state, non-convulsive status epilepticus, delirium from fever, ativan related; Neurology treating for non-convulsive status but feel this is less likely. 10/18-worsening agitation with fevers,r/o enceph, meningitis (unlikely) r/o seizure related, drugs, extension bleed? Edema now?  P:   -STAT ct head after airway 10-18 NSC -keppra/dilantin per neurology, levels per neuro,  -repeat EEG and imaging per neurology. Looks better per report 10-19 -BP control with labetalol, Goal SBP < 140-160 - acyclovir, meningitis , empirical  -empiric decadron until edema on lesion re asessed -HOB elevated -will d/w neuro LP utility, not needed 10-19 -low glu  Noted, add d5 after d50 -fever control  PULMONARY  Lab 08/20/12 1230 08/20/12 0835 08/20/12 0015  PHART 7.365 7.502* 7.400  PCO2ART 38.3 26.4* 40.4  PO2ART  194.0* 382.0* 99.3  HCO3 21.4 20.9 24.5*  O2SAT 99.6 99.5 97.2   Ventilator Settings: Vent Mode:  [-] PRVC FiO2 (%):  [30 %-100 %]  30 % Set Rate:  [12 bmp] 12 bmp Vt Set:  [450 mL] 450 mL PEEP:  [5 cmH20] 5 cmH20 Plateau Pressure:  [12 cmH20-20 cmH20] 20 cmH20  ETT:  10-18>> 10/19  A:   Acute resp failure due to neurologic dysfunction P:   -Passed SBT 10/19 >> Extubation ordered   CARDIOVASCULAR  Lab 08/20/12 1149 08/20/12 0430  TROPONINI -- <0.30  LATICACIDVEN 1.6 3.3*  PROBNP -- --   ECG:  Sinus tach, RBBB, nonspecific st wave changes Lines: 10-18 rt ij cvl>>  A: Sinus tach, with fever, SIRS?  Volume deplete from poor po intake? P:  -to d5 -UA neg -some volume, fever with insensible losses  RENAL  Lab 08/21/12 0602 08/20/12 0420 08/19/12 2214 08/18/12 1818 08/16/12 1614  NA 137 145 143 138 135  K 2.7* 4.4 -- -- --  CL 103 107 103 99 101  CO2 25 27 26 27 25   BUN 10 19 22  35* 33*  CREATININE 1.03 1.22* 1.23* 1.34* 1.34*  CALCIUM 6.8* 8.7 9.4 9.3 9.0  MG -- -- -- -- --  PHOS -- -- -- -- --   Intake/Output      10/18 0701 - 10/19 0700 10/19 0701 - 10/20 0700   I.V. (mL/kg) 1457.9 (27.6)    Other     IV Piggyback 1130    Total Intake(mL/kg) 2587.9 (48.9)    Urine (mL/kg/hr) 2360 (1.9)    Total Output 2360    Net +227.9          Foley:  n/a  A:  CKD, Cr near baseline Hypokalemia  P:   -IVFs adjusted -Replete K+ as needed   GASTROINTESTINAL  Lab 08/21/12 0602 08/20/12 0430 08/16/12 1614 08/14/12 1436  AST 52* 51* 24 25  ALT 26 29 15 17   ALKPHOS 66 85 105 137*  BILITOT 0.3 0.5 0.3 0.3  PROT 5.1* 6.5 6.2 6.7  ALBUMIN 2.3* 3.2* 3.7 3.8    A:  No acute issues P:   NPO after extubation Cont pepcid   HEMATOLOGIC  Lab 08/21/12 0602 08/20/12 0420 08/19/12 2214 08/18/12 1818 08/16/12 1615  HGB 9.6* 12.1 13.5 12.6 12.4  HCT 29.6* 36.8 40.6 38.0 41.6  PLT 173 245 204 270 --  INR -- -- -- 0.93 --  APTT -- -- -- -- --    A: No acute issues P:  -SCDs in setting of ICH   INFECTIOUS  Lab 08/21/12 0602 08/20/12 0430 08/20/12 0420 08/19/12 2214 08/18/12 1818 08/16/12 1615   WBC 11.2* -- 15.8* 14.4* 11.0* 9.2  PROCALCITON <0.10 <0.10 -- -- -- --   Cultures: PCT 10/18: < 0/10, 10/19: < 0.10 10/17 blood >> 10/18 blood >>  10/19 blood >>  10/17 urine >>    Antibiotics: Amp 10/18 >> 10/19 vanc 10/18 >> 10/19 Acyclovir 10/18 >> 10/19 Ceftriaxone 10/18 >> 10/19   A:  Fever resolved, no evidence of acute infectious process  2 normal PCTs makes bacterial meningitis very unlikely. Does not R/O HSV but this seems exceedingly unlikely P:   -D/C abx   ENDOCRINE  Lab 08/21/12 0442 08/20/12 2357 08/20/12 2003 08/20/12 1928 08/20/12 1532  GLUCAP 125* 156* 103* 51* 236*   A:  DM2, hypoglycemic event P:   -Cont CBGs/SSI if indicated   BEST PRACTICE / DISPOSITION Level of  Care:  ICU Primary Service:  PCCM Consultants:  Neurology Code Status:  full Diet:  npo DVT Px:  scd GI Px:  pepcid Skin Integrity:  Normal on admission Social / Family:  None at bedside   40 mins CCM time  Billy Fischer, MD ; [facility] service Mobile [phone].  After 5:30 PM or weekends, call [phone]

## 2012-08-22 ENCOUNTER — Inpatient Hospital Stay (HOSPITAL_COMMUNITY): Payer: Medicare Other

## 2012-08-22 LAB — URINE CULTURE
Colony Count: NO GROWTH
Culture: NO GROWTH

## 2012-08-22 LAB — GLUCOSE, CAPILLARY
Glucose-Capillary: 111 mg/dL — ABNORMAL HIGH (ref 70–99)
Glucose-Capillary: 183 mg/dL — ABNORMAL HIGH (ref 70–99)
Glucose-Capillary: 141 mg/dL — ABNORMAL HIGH (ref 70–99)

## 2012-08-22 LAB — BASIC METABOLIC PANEL
BUN: 7 mg/dL (ref 6–23)
GFR calc Af Amer: 60 mL/min — ABNORMAL LOW (ref >90)
GFR calc non Af Amer: 52 mL/min — ABNORMAL LOW (ref >90)
Potassium: 3.9 mEq/L (ref 3.5–5.1)
Sodium: 142 mEq/L (ref 135–145)

## 2012-08-22 LAB — CBC
HCT: 31.9 % — ABNORMAL LOW (ref 36.0–46.0)
MCHC: 32.6 g/dL (ref 30.0–36.0)
RDW: 12.9 % (ref 11.5–15.5)

## 2012-08-22 LAB — PHENYTOIN LEVEL, TOTAL: Phenytoin Lvl: 5.6 ug/mL — ABNORMAL LOW (ref 10.0–20.0)

## 2012-08-22 MED ORDER — LORAZEPAM 2 MG/ML IJ SOLN
0.5000 mg | Freq: Once | INTRAMUSCULAR | Status: AC
  Administered 2012-08-22: 0.5 mg via INTRAVENOUS
  Filled 2012-08-22: qty 1

## 2012-08-22 MED ORDER — LEVETIRACETAM 500 MG/5ML IV SOLN
750.0000 mg | Freq: Two times a day (BID) | INTRAVENOUS | Status: DC
  Administered 2012-08-22: 750 mg via INTRAVENOUS
  Filled 2012-08-22 (×3): qty 7.5

## 2012-08-22 MED ORDER — SODIUM CHLORIDE 0.9 % IV BOLUS (SEPSIS)
500.0000 mL | Freq: Once | INTRAVENOUS | Status: AC
  Administered 2012-08-22: 500 mL via INTRAVENOUS

## 2012-08-22 MED ORDER — SODIUM CHLORIDE 0.9 % IV SOLN
750.0000 mg | Freq: Two times a day (BID) | INTRAVENOUS | Status: DC
  Filled 2012-08-22: qty 7.5

## 2012-08-22 NOTE — Progress Notes (Signed)
SLP Cancellation Note  Patient Details Name: [name] [name] MRN: [id number] DOB: Nov 08, 1940   Cancelled treatment:   Received order for BSE.  Attempt x1 but not completed due to patient presenting with lethargy and inability to participate fully. ST to address on 08/23/12.         Moreen Fowler MS, CCC-SLP [phone] [facility] 08/22/2012, 3:17 PM

## 2012-08-22 NOTE — Progress Notes (Signed)
Dr. Roseanne Reno from Stroke services contacted concerning changes in neurological statue of patient. Orders given for stat non-contrast CT of head. Dr. Roseanne Reno informed that further concerns be addressed by Dr. Thad Ranger. Dr. Thad Ranger also contacted concerning sedation for patient to complete CT. Orders given for Ativan IV 0.5mg  and may repeat for another 0.5mg  if first dose not therapeutic. Will perform orders and continue to monitor.

## 2012-08-22 NOTE — Evaluation (Signed)
Physical Therapy Evaluation Patient Details Name: [name] [name] MRN: [id number] DOB: December 11, 1940 Today's Date: 08/22/2012 Time: 1447-1500 PT Time Calculation (min): 13 min  PT Assessment / Plan / Recommendation Clinical Impression  Pt admitted with acute parenchymal hemorrhage in the right occipital. Pt extremely impulsive throughout session and unsafe. RN requested to hold off on any mobility further than sit/stand for safety today. Pt will benefit from skilled PT in the acute care setting in order to maximize functional mobility, safety and strength prior to d/c. Will continue further assessment in future sessions.     PT Assessment  Patient needs continued PT services    Follow Up Recommendations  Post acute inpatient;Supervision/Assistance - 24 hour    Does the patient have the potential to tolerate intense rehabilitation   Yes, Recommend IP Rehab Screening  Barriers to Discharge        Equipment Recommendations  Rolling walker with 5" wheels    Recommendations for Other Services Rehab consult   Frequency Min 4X/week    Precautions / Restrictions Precautions Precautions: Fall Restrictions Weight Bearing Restrictions: No   Pertinent Vitals/Pain No pain expressed.       Mobility  Bed Mobility Bed Mobility: Supine to Sit;Sitting - Scoot to Edge of Bed Supine to Sit: 4: Min guard Sitting - Scoot to [place] of Bed: 4: Min guard Details for Bed Mobility Assistance: Minguard for safety as pt extremely impulsive Transfers Transfers: Sit to Stand;Stand to Sit Sit to Stand: 4: Min assist;With upper extremity assist;From bed Stand to Sit: 4: Min assist;With upper extremity assist;To bed Details for Transfer Assistance: Performed sit/stand x 3. Pt impulsive and standing up with min assist for safety and stability and just sitting down without cueing.  Ambulation/Gait Ambulation/Gait Assistance: Not tested (comment) Modified Rankin (Stroke Patients Only) Pre-Morbid Rankin Score:  No symptoms Modified Rankin: Moderately severe disability    Shoulder Instructions     Exercises     PT Diagnosis: Difficulty walking;Abnormality of gait;Altered mental status  PT Problem List: Decreased strength;Decreased activity tolerance;Decreased balance;Decreased mobility;Decreased cognition;Decreased coordination;Decreased knowledge of use of DME;Decreased safety awareness;Decreased knowledge of precautions PT Treatment Interventions: DME instruction;Gait training;Functional mobility training;Therapeutic activities;Balance training;Neuromuscular re-education;Cognitive remediation;Patient/family education   PT Goals Acute Rehab PT Goals PT Goal Formulation: Patient unable to participate in goal setting Time For Goal Achievement: 09/05/12 Potential to Achieve Goals: Fair Pt will go Supine/Side to Sit: with modified independence PT Goal: Supine/Side to Sit - Progress: Goal set today Pt will go Sit to Supine/Side: with modified independence PT Goal: Sit to Supine/Side - Progress: Goal set today Pt will go Sit to Stand: with modified independence PT Goal: Sit to Stand - Progress: Goal set today Pt will go Stand to Sit: with modified independence PT Goal: Stand to Sit - Progress: Goal set today Pt will Transfer Bed to Chair/Chair to Bed: with supervision PT Transfer Goal: Bed to Chair/Chair to Bed - Progress: Goal set today Pt will Ambulate: 51 - 150 feet;with supervision;with least restrictive assistive device PT Goal: Ambulate - Progress: Goal set today  Visit Information  Last PT Received On: 08/22/12 Assistance Needed: +2    Subjective Data  Patient Stated Goal: pt nonverbal during session, no goal given   Prior Functioning  Home Living Lives With: Spouse Available Help at Discharge: Family Additional Comments: pt not answering questions, unsure of home setup Prior Function Comments: pt not answering questions, unsure of setup Communication Communication: Expressive  difficulties Dominant Hand: Right    Cognition  Overall Cognitive  Status: Impaired Area of Impairment: Following commands;Safety/judgement;Awareness of errors;Awareness of deficits;Problem solving;Attention Arousal/Alertness: Lethargic Orientation Level: Other (comment) (unknown as pt not answering) Behavior During Session: Agitated Attention - Other Comments: pt unable to pay attention at all Following Commands: Follows one step commands inconsistently Safety/Judgement: Impulsive;Decreased awareness of need for assistance Awareness of Errors: Assistance required to identify errors made Awareness of Deficits: unaware of deficits Problem Solving: unable to problem solve tasks    Extremity/Trunk Assessment Right Lower Extremity Assessment RLE ROM/Strength/Tone: Unable to fully assess;Due to impaired cognition;Deficits RLE ROM/Strength/Tone Deficits: MMT >/= 3/5. Pt unable cognitively to follow resistance directions Left Lower Extremity Assessment LLE ROM/Strength/Tone: Deficits;Unable to fully assess LLE ROM/Strength/Tone Deficits: MMT >/= 3/5. Pt unable cognitively to follow resistance directions. Although LLE weaker functionall than RLE as shown during marching   Balance Balance Balance Assessed: Yes Dynamic Standing Balance Dynamic Standing - Balance Support: No upper extremity supported Dynamic Standing - Balance Activities: Other (comment) (marching in place) Dynamic Standing - Comments: Pt with decreased balance requiring assistance when weight bearing on LLE during marching  End of Session PT - End of Session Equipment Utilized During Treatment: Gait belt Activity Tolerance: Treatment limited secondary to agitation Patient left: in bed;with call bell/phone within reach Nurse Communication: Mobility status  GP     Milana Kidney 08/22/2012, 5:09 PM  08/22/2012 Milana Kidney DPT PAGER: [phone] OFFICE: [phone]

## 2012-08-22 NOTE — Progress Notes (Addendum)
Name: [name] [name] MRN: [id number] DOB: February 13, 1941    LOS: 4  Referring Provider:  [facility] Reason for Referral:  AMS in setting of altered mental status  PULMONARY / CRITICAL CARE MEDICINE  Brief patient description:  71 y/o female was admitted on 10/17 to [facility] with a subacute intracranial bleed noted on an outpatient MRI ordered for headache.  Within 24 hours of admission she developed fever, seizure, and ongoing confusion. Resp failure , ET 10/18.  Events Since Admission: 10/17 CT head R posterior temporoparietal intraparenchymal hemorrhage  Without significant change 10/17 CT head R posterior temporoparietal intraparenchymal hemorrhage 1.3 x 2.1 cm 10/15 MRI brain 1.3x2.2x1.4cm R posterior temporoparietal intraparenchymal hemorrhage consistent with an event occuring one week ago 10/18- resp failure, intubation emergent, fever 10/18 EEG: This EEG showed moderately severe, continuous, nonspecific, generalized slowing of cerebral activity. This pattern of slowing can be seen with number of different etiologies including degenerative and metabolic encephalopathies as well as postictal state following generalized seizure. No evidence of ongoing seizure activity was seen. 10/19 EEG: This EEG shows mild, generalized, nonspecific, continuous slowing of cerebral activity with evidence of marked improvement compared to previous EEG study on August 19, 2012. No epileptiform discharges were recorded.   Current Status: Follows commands   Vital Signs: Temp:  [98.5 F (36.9 C)-102 F (38.9 C)] 99.6 F (37.6 C) (10/20 0800) Pulse Rate:  [94-134] 111  (10/20 0800) Resp:  [11-37] 21  (10/20 0800) BP: (111-154)/(48-109) 153/95 mmHg (10/20 0800) SpO2:  [2 %-100 %] 94 % (10/20 0800) FiO2 (%):  [30 %] 30 % (10/19 1100)  Physical Examination: [id number] , lethargic but follows simple commands HEENT: long neck, rt i j cvl noted PULM: coarse bilat CV: s1 s2 Tachy,  AB: BS+, soft, nontender, no  hsm Ext: warm, no edema, no clubbing, no cyanosis Derm: no rash or skin breakdown Neuro: perrl , @5  mm, follows commands. Lethargic but arousable   Principal Problem:  *Hemorrhage in the brain-13 x 22 x 14 mm right posterior temporo-occipital intra-axial acute Active Problems:  GERD (gastroesophageal reflux disease)  DM type 2 (diabetes mellitus, type 2)  Dysphagia  HTN (hypertension)  CKD (chronic kidney disease) stage 3, GFR 30-59 ml/min  Leukocytosis  Left hip pain  Left hemiparesis  Hemianopia, homonymous, left  Hemisensory deficit, left  Nausea  Headache  Encephalopathy acute  Acute respiratory failure with hypoxia  Seizure disorder, grand mal  Seizure    ASSESSMENT AND PLAN  NEUROLOGIC  A:   Sub acute intraparenchymal hemorrhage, likely occurred one week prior to admission based on imaging; stable with multiple images today Ongoing encephalopathy of uncertain etiology, ddx post ictal state, non-convulsive status epilepticus, delirium from fever, ativan related; Neurology treating for non-convulsive status but feel this is less likely. 10/18-worsening agitation with fevers,r/o enceph, meningitis (unlikely) r/o seizure related, drugs, extension bleed? Edema now?  P:   -STAT ct head after airway 10-18 NSC -keppra/dilantin per neurology, levels per neuro,  -repeat EEG and imaging per neurology. Looks better per report 10-19 -BP control with labetalol, Goal SBP < 140-160 - acyclovir, meningitis , empirical , all stopped 10-19 -empiric decadron until edema on lesion re asessed. Never given -HOB elevated -LP , not needed 10-19 -low glu  Noted, add d5 after d50 10-19 -fever control  PULMONARY  Lab 08/20/12 1230 08/20/12 0835 08/20/12 0015  PHART 7.365 7.502* 7.400  PCO2ART 38.3 26.4* 40.4  PO2ART 194.0* 382.0* 99.3  HCO3 21.4 20.9 24.5*  O2SAT 99.6 99.5 97.2  Ventilator Settings: Vent Mode:  [-]  FiO2 (%):  [30 %] 30 %  ETT:  10-18>> 10/19  A:   Acute  resp failure due to neurologic dysfunction(resolved) P:   -Passed SBT 10/19 >> Extubation performed   CARDIOVASCULAR  Lab 08/20/12 1149 08/20/12 0430  TROPONINI -- <0.30  LATICACIDVEN 1.6 3.3*  PROBNP -- --   ECG:  Sinus tach, RBBB, nonspecific st wave changes Lines: 10-18 rt ij cvl>>  A: Sinus tach, with fever, SIRS?  Volume deplete from poor po intake? P:  -to d5ns -UA neg -some volume, fever with insensible losses -one time fluid bolus  RENAL  Lab 08/22/12 0417 08/21/12 0602 08/20/12 0420 08/19/12 2214 08/18/12 1818  NA 142 137 145 143 138  K 3.9 2.7* -- -- --  CL 107 103 107 103 99  CO2 22 25 27 26 27   BUN 7 10 19 22  35*  CREATININE 1.06 1.03 1.22* 1.23* 1.34*  CALCIUM 7.7* 6.8* 8.7 9.4 9.3  MG -- -- -- -- --  PHOS -- -- -- -- --   Intake/Output      10/19 0701 - 10/20 0700 10/20 0701 - 10/21 0700   I.V. (mL/kg) 1828.7 (34.6) 75 (1.4)   IV Piggyback 605    Total Intake(mL/kg) 2433.7 (46) 75 (1.4)   Urine (mL/kg/hr) 3385 (2.7) 295   Total Output 3385 295   Net -951.3 -220         Foley:  n/a  A:  CKD, Cr near baseline Lab Results  Component Value Date   CREATININE 1.06 08/22/2012   CREATININE 1.03 08/21/2012   CREATININE 1.22* 08/20/2012   CREATININE 1.34* 08/16/2012   CREATININE 1.45* 08/14/2012    Hypokalemia  Lab 08/22/12 0417 08/21/12 0602 08/20/12 0420  K 3.9 2.7* 4.4      P:   -IVFs adjusted -Replete K+ as needed   GASTROINTESTINAL  Lab 08/21/12 0602 08/20/12 0430 08/16/12 1614  AST 52* 51* 24  ALT 26 29 15   ALKPHOS 66 85 105  BILITOT 0.3 0.5 0.3  PROT 5.1* 6.5 6.2  ALBUMIN 2.3* 3.2* 3.7    A:  No acute issues P:   NPO after extubation Cont pepcid   HEMATOLOGIC  Lab 08/22/12 0417 08/21/12 0602 08/20/12 0420 08/19/12 2214 08/18/12 1818  HGB 10.4* 9.6* 12.1 13.5 12.6  HCT 31.9* 29.6* 36.8 40.6 38.0  PLT 177 173 245 204 270  INR -- -- -- -- 0.93  APTT -- -- -- -- --    A: No acute issues P:  -SCDs in setting of  ICH   INFECTIOUS  Lab 08/22/12 0417 08/21/12 0602 08/20/12 0430 08/20/12 0420 08/19/12 2214 08/18/12 1818  WBC 15.6* 11.2* -- 15.8* 14.4* 11.0*  PROCALCITON 0.12 <0.10 <0.10 -- -- --   Cultures: PCT 10/18: < 0/10, 10/19: < 0.10 10/17 blood >> 10/18 blood >>  10/19 blood >>  10/17 urine >> neg   Antibiotics: Amp 10/18 >> 10/19 vanc 10/18 >> 10/19 Acyclovir 10/18 >> 10/19 Ceftriaxone 10/18 >> 10/19   A:  Fever resolved, no evidence of acute infectious process  2 normal PCTs makes bacterial meningitis very unlikely. Does not R/O HSV but this seems exceedingly unlikely P:   -D/C'd abx   ENDOCRINE  Lab 08/22/12 0401 08/21/12 2317 08/21/12 2001 08/21/12 1557 08/21/12 1202  GLUCAP 183* 111* 141* 129* 165*   A:  DM2, hypoglycemic event P:   -Cont CBGs/SSI if indicated swallow eval   BEST PRACTICE / DISPOSITION  Level of Care:  ICU Primary Service:  PCCM Consultants:  Neurology Code Status:  full Diet:  npo DVT Px:  scd GI Px:  pepcid Skin Integrity:  Normal on admission Social / Family:  None at bedside   Billy Fischer, MD ; [facility] service Mobile [phone].  After 5:30 PM or weekends, call [phone]

## 2012-08-22 NOTE — Progress Notes (Signed)
While rounding, patient was noted with increased activity, unable to follow commands, and unable to verbalize person, place, and time. Pupils 4mm equal and brisk. Patient also remains tachycardic, considering being given prn doses of Metoprolol. She is also febrile.  Dr. Vassie Loll, in [place], contacted about findings. No new orders given. Will continue to monitor.

## 2012-08-22 NOTE — Progress Notes (Signed)
Stroke Team Progress Note  HISTORY  [name] [name] is an 71 y.o. Female complains of headache and nausea and vomiting in early Oct, 2013. later her headache got worse and she started becoming confused.  She runs into things on the left, stumbling, and just not seeming like herself.   She was told to take aspirin every 6 hours along with Tylenol and has been doing so for the past 2 days. She was not taking regular aspirin prior to her onset of symptoms.   CT head showed  acute parenchymal  hemorrhage in the right occipital lobe just below the occipital  horn of the right lateral ventricle.   She had seizure in 10/18,EEG twice, 10/18, moderately severe, continuoues generalized slowing.  Keppra was given at 10/18, 3am.  Repeat 10/19: much improved, mild generalized slowing.    SUBJECTIVE She is now extubated, alone at bedside, confused, I am not sure of her baseline.tachycardia.  OBJECTIVE Most recent Vital Signs: Filed Vitals:   08/22/12 0700 08/22/12 0800 08/22/12 0900 08/22/12 1000  BP: 148/65 153/95 156/67 146/58  Pulse: 113 111 111 111  Temp:  99.6 F (37.6 C)    TempSrc:  Oral    Resp: 24 21 26 27   Height:      Weight:      SpO2: 94% 94% 95% 94%   CBG (last 3)   Basename 08/22/12 0759 08/22/12 0401 08/21/12 2317  GLUCAP 76 183* 111*    IV Fluid Intake:      . dextrose 5 % and 0.45 % NaCl with KCl 40 mEq/L 75 mL (08/21/12 1122)    MEDICATIONS     . antiseptic oral rinse  15 mL Mouth Rinse QID  . chlorhexidine  15 mL Mouth Rinse BID  . famotidine (PEPCID) IV  20 mg Intravenous Q12H  . insulin aspart  0-15 Units Subcutaneous Q4H  . levetiracetam  500 mg Intravenous BID  . potassium chloride  10 mEq Intravenous Q1 Hr x 6  . sodium chloride  500 mL Intravenous Once  . DISCONTD: famotidine (PEPCID) IV  20 mg Intravenous Q12H  . DISCONTD: levetiracetam  500 mg Intravenous BID   PRN:  acetaminophen, fentaNYL, labetalol, metoprolol, ondansetron (ZOFRAN) IV, DISCONTD:  Racepinephrine HCl  Diet:  NPO  Activity:   Up with assistance DVT Prophylaxis:  SCDs   CLINICALLY SIGNIFICANT STUDIES Basic Metabolic Panel:   Lab 08/22/12 0417 08/21/12 0602  NA 142 137  K 3.9 2.7*  CL 107 103  CO2 22 25  GLUCOSE 200* 135*  BUN 7 10  CREATININE 1.06 1.03  CALCIUM 7.7* 6.8*  MG -- --  PHOS -- --   Liver Function Tests:   Lab 08/21/12 0602 08/20/12 0430  AST 52* 51*  ALT 26 29  ALKPHOS 66 85  BILITOT 0.3 0.5  PROT 5.1* 6.5  ALBUMIN 2.3* 3.2*   CBC:   Lab 08/22/12 0417 08/21/12 0602 08/18/12 1818  WBC 15.6* 11.2* --  NEUTROABS -- 8.5* 6.5  HGB 10.4* 9.6* --  HCT 31.9* 29.6* --  MCV 94.9 95.2 --  PLT 177 173 --   Coagulation:   Lab 08/18/12 1818  LABPROT 12.4  INR 0.93   Cardiac Enzymes:   Lab 08/20/12 0430  CKTOTAL --  CKMB --  CKMBINDEX --  TROPONINI <0.30   Urinalysis:   Lab 08/19/12 2150 08/18/12 2129  COLORURINE YELLOW YELLOW  LABSPEC 1.011 1.013  PHURINE 6.5 6.0  GLUCOSEU NEGATIVE 100*  HGBUR TRACE* NEGATIVE  BILIRUBINUR  NEGATIVE NEGATIVE  KETONESUR 15* NEGATIVE  PROTEINUR NEGATIVE NEGATIVE  UROBILINOGEN 1.0 0.2  NITRITE NEGATIVE NEGATIVE  LEUKOCYTESUR NEGATIVE NEGATIVE   Lipid Panel No results found for this basename: chol,  trig,  hdl,  cholhdl,  vldl,  ldlcalc   HgbA1C  Lab Results  Component Value Date   HGBA1C 7.4* 08/19/2012    Urine Drug Screen:   No results found for this basename: labopia,  cocainscrnur,  labbenz,  amphetmu,  thcu,  labbarb    Alcohol Level: No results found for this basename: ETH:2 in the last 168 hours   CT of the brain   08/20/2012  1.  Stable appearance of the brain, with acute parenchymal hemorrhage in the right occipital lobe just below the occipital horn of the right lateral ventricle.      08/19/2012  Right posterior temporoparietal intraparenchymal hemorrhage without significant change.  Underlying cerebral atrophy and small vessel ischemic changes.    08/19/2012  Stable  right posterior temporo-occipital hemorrhage.   MRI of the brain  08/18/2012 13 x 22 x 14 mm right posterior temporo-occipital intra-axial acute and subacute hemorrhage consistent with an event occurring 1 week ago.  The appearance is nonspecific and could represent hemorrhage is secondary to hypertension, anticoagulation, occult trauma, venous angioma or cavernoma (OCVM), pial AVM, hemorrhagic metastasis, or idiopathic.  Continued surveillance is warranted.     CXR 08/20/2012  Endotracheal tube tip projects 2.6 cm proximal to the carina. Otherwise, no interval change.   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  No evidence of active pulmonary disease.    08/18/2012 No acute cardiopulmonary process.    EKG  normal sinus rhythm, PAC's noted.   Therapy Recommendations PT - ; OT - ; ST -   Physical Exam : Cardiac exam no murmur or gallop tachycardia. Lungs are clear to auscultation. Distal pulses are well felt.  Neurological Exam: Patient is extubated, confused, lying in bed.  Pupils are 4 mm equal reactive. Doll's eye movements are present.. Corneal reflexes are present. She has bilateral arm spontaneous movement. Deep tendon flexes are preserved. Both plantars showed withdrawal response.  ASSESSMENT [name] is a 71 y.o. female with acute right posterior temporo-occipital intra-axial hemorrhage. Hemorrhage most likely secondary to hypertension and frequent ingestion of aspirin (new antihypertensives started within the past 1 mo with recent q6h aspirin), On aspirin q 6 hr for headache prior to admission.    New focal seizures 10/17 , EEG showed marked improvement after starting keppra  Respiratory status deteriorated 08/20/2012 am, requiring intubation in setting of elevated temp. Respiratory status deterioration likely due to CNS sedation due to antiepileptics and/or possible aspiration. Now has improved, extubated.   Presumed aspiration pneumonia, started on abx  Headache  secondary to hemorrhage. On tylenol #3  Diabetes, HgbA1c 7.4  GERD  Left hip pain, recent fall per husband  Creatinine 1.34 Hypertension, new medications added within past 1 mo  Hospital day # 4  TREATMENT/PLAN  Change SCDs to lovenox for optimal VTE prophy. Per guidelines, lovenox optimal pharmacologic treatment in    Remain confused, increase keppra.750mg  bid. Repeat EEG

## 2012-08-23 ENCOUNTER — Inpatient Hospital Stay (HOSPITAL_COMMUNITY): Payer: Medicare Other

## 2012-08-23 DIAGNOSIS — R569 Unspecified convulsions: Secondary | ICD-10-CM

## 2012-08-23 DIAGNOSIS — G8190 Hemiplegia, unspecified affecting unspecified side: Secondary | ICD-10-CM

## 2012-08-23 LAB — CBC
MCHC: 33.7 g/dL (ref 30.0–36.0)
MCV: 93.7 fL (ref 78.0–100.0)
Platelets: 178 10*3/uL (ref 150–400)
RDW: 12.7 % (ref 11.5–15.5)
WBC: 18.8 10*3/uL — ABNORMAL HIGH (ref 4.0–10.5)

## 2012-08-23 LAB — BASIC METABOLIC PANEL
BUN: 7 mg/dL (ref 6–23)
Chloride: 104 mEq/L (ref 96–112)
Creatinine, Ser: 1.13 mg/dL — ABNORMAL HIGH (ref 0.50–1.10)
GFR calc Af Amer: 55 mL/min — ABNORMAL LOW (ref >90)
GFR calc non Af Amer: 48 mL/min — ABNORMAL LOW (ref >90)
Potassium: 3.2 mEq/L — ABNORMAL LOW (ref 3.5–5.1)

## 2012-08-23 LAB — GLUCOSE, CAPILLARY
Glucose-Capillary: 166 mg/dL — ABNORMAL HIGH (ref 70–99)
Glucose-Capillary: 142 mg/dL — ABNORMAL HIGH (ref 70–99)

## 2012-08-23 MED ORDER — SODIUM CHLORIDE 0.9 % IV SOLN
250.0000 mg | Freq: Two times a day (BID) | INTRAVENOUS | Status: DC
  Administered 2012-08-23 – 2012-08-26 (×7): 250 mg via INTRAVENOUS
  Filled 2012-08-23 (×9): qty 2.5

## 2012-08-23 MED ORDER — JEVITY 1.2 CAL PO LIQD
1000.0000 mL | ORAL | Status: DC
  Administered 2012-08-23: 20 mL/h
  Administered 2012-08-24: 1000 mL
  Filled 2012-08-23 (×5): qty 1000

## 2012-08-23 MED ORDER — LABETALOL HCL 5 MG/ML IV SOLN
10.0000 mg | INTRAVENOUS | Status: DC | PRN
  Filled 2012-08-23: qty 4

## 2012-08-23 MED ORDER — POTASSIUM CHLORIDE 10 MEQ/50ML IV SOLN
10.0000 meq | INTRAVENOUS | Status: AC
  Administered 2012-08-23 (×5): 10 meq via INTRAVENOUS
  Filled 2012-08-23 (×5): qty 50

## 2012-08-23 MED ORDER — DEXTROSE-NACL 5-0.9 % IV SOLN
INTRAVENOUS | Status: DC
  Administered 2012-08-23: 14:00:00 via INTRAVENOUS
  Administered 2012-08-25: 1000 mL via INTRAVENOUS
  Administered 2012-08-25: 18:00:00 via INTRAVENOUS
  Filled 2012-08-23: qty 1000

## 2012-08-23 NOTE — Evaluation (Signed)
SLP has reviewed and agrees with student's note below.  Stephanye Finnicum Willis Kouper Spinella M.Ed CCC-SLP Pager [phone]  08/23/2012  

## 2012-08-23 NOTE — Progress Notes (Signed)
Name: Karlisha Mathena MRN: [id number] DOB: December 01, 1940    LOS: 5  Referring Provider:  [facility] Reason for Referral:  AMS in setting of altered mental status  PULMONARY / CRITICAL CARE MEDICINE  Brief patient description:  71 y/o female was admitted on 10/17 to [facility] with a subacute intracranial bleed noted on an outpatient MRI ordered for headache.  Within 24 hours of admission she developed fever, seizure, and ongoing confusion. Resp failure , ET 10/18.  Events Since Admission: 10/17 CT head R posterior temporoparietal intraparenchymal hemorrhage  Without significant change 10/17 CT head R posterior temporoparietal intraparenchymal hemorrhage 1.3 x 2.1 cm 10/15 MRI brain 1.3x2.2x1.4cm R posterior temporoparietal intraparenchymal hemorrhage consistent with an event occuring one week ago 10/18- resp failure, intubation emergent, fever 10/18 EEG: This EEG showed moderately severe, continuous, nonspecific, generalized slowing of cerebral activity. This pattern of slowing can be seen with number of different etiologies including degenerative and metabolic encephalopathies as well as postictal state following generalized seizure. No evidence of ongoing seizure activity was seen. 10/19 EEG: This EEG shows mild, generalized, nonspecific, continuous slowing of cerebral activity with evidence of marked improvement compared to previous EEG study on August 19, 2012. No epileptiform discharges were recorded.   Current Status: Follows some commands intermittently, very lethargic EEG being started now   Vital Signs: Temp:  [99.7 F (37.6 C)-100.8 F (38.2 C)] 100 F (37.8 C) (10/21 0400) Pulse Rate:  [107-134] 115  (10/21 0800) Resp:  [18-31] 30  (10/21 0800) BP: (132-174)/(58-139) 165/80 mmHg (10/21 0800) SpO2:  [92 %-97 %] 93 % (10/21 0800) Weight:  [52.9 kg (116 lb 10 oz)] 52.9 kg (116 lb 10 oz) (10/21 0500)  Physical Examination: Gen :Extubated, lethargic but follows simple commands HEENT: long  neck, rt i j cvl noted PULM: coarse bilat CV: s1 s2 Tachy,  AB: BS+, soft, nontender, no hsm Ext: warm, no edema, no clubbing, no cyanosis Derm: no rash or skin breakdown Neuro: R pupil 4 mm, L pupil 5mm, reactive, follows commands. Lethargic but arousable, intermittently follows commands,   Principal Problem:  *Hemorrhage in the brain-13 x 22 x 14 mm right posterior temporo-occipital intra-axial acute Active Problems:  GERD (gastroesophageal reflux disease)  DM type 2 (diabetes mellitus, type 2)  Dysphagia  HTN (hypertension)  CKD (chronic kidney disease) stage 3, GFR 30-59 ml/min  Leukocytosis  Left hip pain  Left hemiparesis  Hemianopia, homonymous, left  Hemisensory deficit, left  Nausea  Headache  Encephalopathy acute  Acute respiratory failure with hypoxia  Seizure disorder, grand mal  Seizure    ASSESSMENT AND PLAN  NEUROLOGIC  A:   Sub acute intraparenchymal hemorrhage, likely occurred ~ one week prior to admission based on imaging; stable with multiple f/u images. Ongoing encephalopathy of uncertain etiology. Was treated by Neurology through weekend for non-convulsive status >> ? Whether this medication-related encephalopathy (dilantin and keppra, single dose ativan 10/20pm) in small woman. Has had low grade fever without clear source or other evidence sepsis  P:   -dilantin stopped, keppra to be dose-adjusted per neurology, low suspicion for status epilepticus  -repeat EEG and imaging per neurology. -BP control with labetalol, Goal SBP < 140-160 -fever control  PULMONARY  Lab 08/20/12 1230 08/20/12 0835 08/20/12 0015  PHART 7.365 7.502* 7.400  PCO2ART 38.3 26.4* 40.4  PO2ART 194.0* 382.0* 99.3  HCO3 21.4 20.9 24.5*  O2SAT 99.6 99.5 97.2   Ventilator Settings:    ETT:  10-18>> 10/19  A:   Acute resp failure due  to neurologic dysfunction (resolved) P:   -Passed SBT 10/19 >> Extubation performed -swallow eval before any PO's -pulm  toilet   CARDIOVASCULAR  Lab 08/20/12 1149 08/20/12 0430  TROPONINI -- <0.30  LATICACIDVEN 1.6 3.3*  PROBNP -- --   ECG:  Sinus tach, RBBB, nonspecific st wave changes Lines: 10-18 rt ij cvl>>  A: Sinus tach, with fever, SIRS?  Volume deplete from poor po intake? P:  -IVF bolus prn  RENAL  Lab 08/23/12 0235 08/22/12 0417 08/21/12 0602 08/20/12 0420 08/19/12 2214  NA 139 142 137 145 143  K 3.2* 3.9 -- -- --  CL 104 107 103 107 103  CO2 25 22 25 27 26   BUN 7 7 10 19 22   CREATININE 1.13* 1.06 1.03 1.22* 1.23*  CALCIUM 7.7* 7.7* 6.8* 8.7 9.4  MG -- -- -- -- --  PHOS -- -- -- -- --   Intake/Output      10/20 0701 - 10/21 0700 10/21 0701 - 10/22 0700   I.V. (mL/kg) 1800 (34) 75 (1.4)   IV Piggyback 155    Total Intake(mL/kg) 1955 (37) 75 (1.4)   Urine (mL/kg/hr) 495 (0.4)    Total Output 495    Net +1460 +75        Urine Occurrence 9 x     Foley:  n/a  A:  CKD, Cr near baseline Lab Results  Component Value Date   CREATININE 1.13* 08/23/2012   CREATININE 1.06 08/22/2012   CREATININE 1.03 08/21/2012   CREATININE 1.34* 08/16/2012   CREATININE 1.45* 08/14/2012    Hypokalemia  Lab 08/23/12 0235 08/22/12 0417 08/21/12 0602  K 3.2* 3.9 2.7*   P:   -follow S Cr -IVFs adjusted -Replete K+ as needed   GASTROINTESTINAL  Lab 08/21/12 0602 08/20/12 0430 08/16/12 1614  AST 52* 51* 24  ALT 26 29 15   ALKPHOS 66 85 105  BILITOT 0.3 0.5 0.3  PROT 5.1* 6.5 6.2  ALBUMIN 2.3* 3.2* 3.7    A:  No acute issues P:   Swallow eval >> needs NPO Place NGT for enteral feeds Cont pepcid   HEMATOLOGIC  Lab 08/23/12 0235 08/22/12 0417 08/21/12 0602 08/20/12 0420 08/19/12 2214 08/18/12 1818  HGB 11.0* 10.4* 9.6* 12.1 13.5 --  HCT 32.6* 31.9* 29.6* 36.8 40.6 --  PLT 178 177 173 245 204 --  INR -- -- -- -- -- 0.93  APTT -- -- -- -- -- --    A: No acute issues P:  -SCDs in setting of ICH   INFECTIOUS  Lab 08/23/12 0235 08/22/12 0417 08/21/12 0602 08/20/12 0430  08/20/12 0420 08/19/12 2214  WBC 18.8* 15.6* 11.2* -- 15.8* 14.4*  PROCALCITON -- 0.12 <0.10 <0.10 -- --   Cultures: PCT 10/18: < 0/10, 10/19: < 0.10 10/17 blood >> 10/18 blood >>  10/19 blood >>  10/17 urine >> neg  Antibiotics: Amp 10/18 >> 10/19 vanc 10/18 >> 10/19 Acyclovir 10/18 >> 10/19 Ceftriaxone 10/18 >> 10/19   A:  Fever resolved, UA negative, CXR with LLL atx vs infiltrate P:   -D/C'd abx 10/19 -follow CXR, fever curve   ENDOCRINE  Lab 08/23/12 0356 08/22/12 2342 08/22/12 2015 08/22/12 1604 08/22/12 1137  GLUCAP 167* 163* 157* 166* 141*   A:  DM2, hypoglycemic event P:   -Cont CBGs/SSI if indicated   BEST PRACTICE / DISPOSITION Level of Care:  ICU Primary Service:  PCCM Consultants:  Neurology Code Status:  full Diet:  npo DVT Px:  scd  GI Px:  pepcid Skin Integrity:  Intact Social / Family:  Reviewed status with husband 10/21   Levy Pupa, MD, PhD 08/23/2012, 9:39 AM [facility] [phone] or if no answer [phone]

## 2012-08-23 NOTE — Progress Notes (Signed)
Stroke Team Progress Note  HISTORY [name] [name] is an 71 y.o. Female complains of headache and nausea and vomiting in early Oct, 2013. later her headache got worse and she started becoming confused.  She runs into things on the left, stumbling, and just not seeming like herself.   She was told to take aspirin every 6 hours along with Tylenol and has been doing so for the past 2 days. She was not taking regular aspirin prior to her onset of symptoms.   CT head showed  acute parenchymal  hemorrhage in the right occipital lobe just below the occipital  horn of the right lateral ventricle.   She had seizure in 10/18,EEG twice, 10/18, moderately severe, continuoues generalized slowing.  Keppra was given at 10/18, 3am.  Repeat 10/19: much improved, mild generalized slowing.    SUBJECTIVE Husband at bedside.   OBJECTIVE Most recent Vital Signs: Filed Vitals:   08/23/12 0500 08/23/12 0600 08/23/12 0700 08/23/12 0800  BP: 162/88 154/73 172/86 165/80  Pulse: 124 126 122 115  Temp:      TempSrc:      Resp: 26 23 27 30   Height:      Weight: 52.9 kg (116 lb 10 oz)     SpO2: 96% 95% 95% 93%   CBG (last 3)   Basename 08/23/12 0356 08/22/12 2342 08/22/12 2015  GLUCAP 167* 163* 157*    IV Fluid Intake:     . dextrose 5 % and 0.45 % NaCl with KCl 40 mEq/L 75 mL/hr at 08/22/12 1630    MEDICATIONS    . antiseptic oral rinse  15 mL Mouth Rinse QID  . chlorhexidine  15 mL Mouth Rinse BID  . famotidine (PEPCID) IV  20 mg Intravenous Q12H  . insulin aspart  0-15 Units Subcutaneous Q4H  . levetiracetam  750 mg Intravenous BID  . LORazepam  0.5 mg Intravenous Once  . sodium chloride  500 mL Intravenous Once  . DISCONTD: levetiracetam  500 mg Intravenous BID  . DISCONTD: levetiracetam  750 mg Intravenous BID   PRN:  acetaminophen, fentaNYL, labetalol, metoprolol, ondansetron (ZOFRAN) IV  Diet:  NPO  Activity:   Up with assistance DVT Prophylaxis:  SCDs   CLINICALLY SIGNIFICANT  STUDIES Basic Metabolic Panel:   Lab 08/23/12 0235 08/22/12 0417  NA 139 142  K 3.2* 3.9  CL 104 107  CO2 25 22  GLUCOSE 109* 200*  BUN 7 7  CREATININE 1.13* 1.06  CALCIUM 7.7* 7.7*  MG -- --  PHOS -- --   Liver Function Tests:   Lab 08/21/12 0602 08/20/12 0430  AST 52* 51*  ALT 26 29  ALKPHOS 66 85  BILITOT 0.3 0.5  PROT 5.1* 6.5  ALBUMIN 2.3* 3.2*   CBC:   Lab 08/23/12 0235 08/22/12 0417 08/21/12 0602 08/18/12 1818  WBC 18.8* 15.6* -- --  NEUTROABS -- -- 8.5* 6.5  HGB 11.0* 10.4* -- --  HCT 32.6* 31.9* -- --  MCV 93.7 94.9 -- --  PLT 178 177 -- --   Coagulation:   Lab 08/18/12 1818  LABPROT 12.4  INR 0.93   Cardiac Enzymes:   Lab 08/20/12 0430  CKTOTAL --  CKMB --  CKMBINDEX --  TROPONINI <0.30   Urinalysis:   Lab 08/19/12 2150 08/18/12 2129  COLORURINE YELLOW YELLOW  LABSPEC 1.011 1.013  PHURINE 6.5 6.0  GLUCOSEU NEGATIVE 100*  HGBUR TRACE* NEGATIVE  BILIRUBINUR NEGATIVE NEGATIVE  KETONESUR 15* NEGATIVE  PROTEINUR NEGATIVE NEGATIVE  UROBILINOGEN  1.0 0.2  NITRITE NEGATIVE NEGATIVE  LEUKOCYTESUR NEGATIVE NEGATIVE   Lipid Panel No results found for this basename: chol,  trig,  hdl,  cholhdl,  vldl,  ldlcalc   HgbA1C  Lab Results  Component Value Date   HGBA1C 7.4* 08/19/2012    Urine Drug Screen:   No results found for this basename: labopia,  cocainscrnur,  labbenz,  amphetmu,  thcu,  labbarb    Alcohol Level: No results found for this basename: ETH:2 in the last 168 hours   CT of the brain   08/22/2012 Stable appearance of the brain since the prior study. No appreciable change in the intra-axial hemorrhage. 08/20/2012  1.  Stable appearance of the brain, with acute parenchymal hemorrhage in the right occipital lobe just below the occipital horn of the right lateral ventricle.      08/19/2012  Right posterior temporoparietal intraparenchymal hemorrhage without significant change.  Underlying cerebral atrophy and small vessel ischemic  changes.    08/19/2012  Stable right posterior temporo-occipital hemorrhage.   MRI of the brain  08/18/2012 13 x 22 x 14 mm right posterior temporo-occipital intra-axial acute and subacute hemorrhage consistent with an event occurring 1 week ago.  The appearance is nonspecific and could represent hemorrhage is secondary to hypertension, anticoagulation, occult trauma, venous angioma or cavernoma (OCVM), pial AVM, hemorrhagic metastasis, or idiopathic.  Continued surveillance is warranted.   2D echo     CXR 08/22/2012 1. Status post extubation. 2. Mild left lower lobe airspace disease. While this could represent atelectasis, and raises concern for infection or less likely aspiration.  3. Mild pulmonary vascular congestion 08/20/2012  Endotracheal tube tip projects 2.6 cm proximal to the carina. Otherwise, no interval change.   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  No evidence of active pulmonary disease.    08/18/2012 No acute cardiopulmonary process.    EKG  normal sinus rhythm, PAC's noted.   Therapy Recommendations PT - CIR ; OT - ; ST -   Physical Exam : Cardiac exam no murmur or gallop tachycardia. Lungs are clear to auscultation. Distal pulses are well felt.  Neurological Exam: Patient is extubated, confused, lying in bed.  Pupils are 4 mm equal reactive. Doll's eye movements are present.. Corneal reflexes are present. She has bilateral arm spontaneous movement. Deep tendon flexes are preserved. Both plantars showed withdrawal response.  ASSESSMENT [name] [name] is a 71 y.o. female with acute right posterior temporo-occipital intra-axial hemorrhage. Hemorrhage most likely secondary to hypertension and frequent ingestion of aspirin (new antihypertensives started within the past 1 mo with recent q6h aspirin), On aspirin q 6 hr for headache prior to admission.    New focal seizures 10/17 , EEG showed marked improvement after starting keppra  Respiratory status deteriorated  08/20/2012 am, requiring intubation in setting of elevated temp. Respiratory status deterioration likely due to CNS sedation due to antiepileptics and/or possible aspiration. Extubated, though remains lethargic, ? Encephalopathic.   Presumed aspiration pneumonia, started on abx  Leukocytosis, wbc 18.8  Headache secondary to hemorrhage  Diabetes, HgbA1c 7.4  GERD  Left hip pain, recent fall per husband. Hip xray cancelled   Creatinine 1.34 Hypertension, new medications added within past 1 mo  Hospital day # 5  TREATMENT/PLAN  Decrease keppra to 250mg  bid. 2D echo to rule out possible endocarditis Ok to transfer out of the ICU from neuro standpoint SBP goal < 180 F/u free dilantin level Discussed with husband and Dr. Levy Pupa, MSN,  RN, ANVP-BC, ANP-BC, GNP-BC [facility] Pager: [phone] 08/23/2012 9:11 AM  Scribe for Dr. Delia Heady, Stroke Center Medical Director, who has personally reviewed chart, pertinent data, examined the patient and developed the plan of care. Pager:  [phone]

## 2012-08-23 NOTE — Progress Notes (Signed)
Rehab Admissions Coordinator Note:  Patient was screened by Brock Ra for appropriateness for an Inpatient Acute Rehab Consult.  At this time, we are recommending Inpatient Rehab consult.  Brock Ra 08/23/2012, 11:10 AM  I can be reached at [phone].

## 2012-08-23 NOTE — Evaluation (Signed)
Occupational Therapy Evaluation Patient Details Name: [name] [name] MRN: [id number] DOB: November 17, 1940 Today's Date: 08/23/2012 Time: 8756-4332 OT Time Calculation (min): 32 min  OT Assessment / Plan / Recommendation Clinical Impression  This 71 yo female admitted and found to have acute right occipital parenchymal hemorrhage just below the occipital horn of the right lateral ventricle presents to acute OT with problems below. Will benefit from acute OT with follow up OT at inpatient rehab    OT Assessment  Patient needs continued OT Services    Follow Up Recommendations  Inpatient Rehab    Barriers to Discharge  (?)    Equipment Recommendations  Other (comment) (TBD)    Recommendations for Other Services Rehab consult  Frequency  Min 3X/week    Precautions / Restrictions Precautions Precautions: Fall Restrictions Weight Bearing Restrictions: No       ADL  Eating/Feeding: NPO Grooming: +1 Total assistance;Simulated Where Assessed - Grooming: Supine, head of bed up Upper Body Bathing: Simulated;+1 Total assistance Where Assessed - Upper Body Bathing: Supine, head of bed up Lower Body Bathing: Simulated;+1 Total assistance Where Assessed - Lower Body Bathing: Supine, head of bed up;Supine, head of bed flat;Rolling right and/or left Upper Body Dressing: Simulated;+1 Total assistance Where Assessed - Upper Body Dressing: Supine, head of bed up Lower Body Dressing: Simulated;+1 Total assistance Where Assessed - Lower Body Dressing: Supine, head of bed up;Supine, head of bed flat;Rolling right and/or left Toilet Transfer: Simulated;+2 Total assistance Toilet Transfer: Patient Percentage: 60% Toilet Transfer Method: Sit to Barista:  (Bed stand pivot to recliner going to her right) Toileting - Architect and Hygiene: Simulated;+2 Total assistance Toileting - Architect and Hygiene: Patient Percentage: 0% Where Assessed - Toileting  Clothing Manipulation and Hygiene: Standing (One to maintain standing and one for clothes/hygien) Equipment Used: Gait belt Transfers/Ambulation Related to ADLs: total A+2 (60%) sit to stand and stand to sit. (80%) standing    OT Diagnosis: Generalized weakness;Cognitive deficits;Disturbance of vision;Hemiplegia dominant side  OT Problem List: Decreased strength;Decreased range of motion;Decreased activity tolerance;Impaired balance (sitting and/or standing);Impaired vision/perception;Decreased cognition;Decreased safety awareness;Decreased knowledge of use of DME or AE;Impaired sensation;Impaired UE functional use OT Treatment Interventions: Self-care/ADL training;Neuromuscular education;DME and/or AE instruction;Therapeutic activities;Cognitive remediation/compensation;Visual/perceptual remediation/compensation;Patient/family education;Balance training   OT Goals Acute Rehab OT Goals OT Goal Formulation: Patient unable to participate in goal setting Time For Goal Achievement: 09/06/12 Potential to Achieve Goals: Good ADL Goals Pt Will Perform Grooming: with min assist;Supported;Supine, head of bed up;Sitting, chair (1 task) ADL Goal: Grooming - Progress: Goal set today Pt Will Transfer to Toilet: with min assist;Stand pivot transfer;3-in-1 ADL Goal: Toilet Transfer - Progress: Goal set today Miscellaneous OT Goals Miscellaneous OT Goal #1: Pt will be able to sit EOB >5 minutes with min A in prep for increased BADLS and transfers. OT Goal: Miscellaneous Goal #1 - Progress: Goal set today Miscellaneous OT Goal #2: Pt will be able to follow 1 step commands 75% of the time. OT Goal: Miscellaneous Goal #2 - Progress: Goal set today  Visit Information  Last OT Received On: 08/23/12 Assistance Needed: +2 PT/OT Co-Evaluation/Treatment: Yes    Subjective Data  Subjective: "Bonita Quin" when asked her first name on second attempt Patient Stated Goal: Pt unable due to expressive difficulties     Prior Functioning     Home Living Lives With: Spouse Available Help at Discharge: Family Additional Comments: No family available to ger further info from Communication Communication: Receptive difficulties;Expressive difficulties Dominant Hand: Right  Vision/Perception Vision - Assessment Eye Alignment: Within Functional Limits Vision Assessment: Vision tested Ocular Range of Motion: Restricted on the right;Restricted on the left;Restricted looking up;Restricted looking down (eyes stay in midline) Alignment/Gaze Preference:  (middle gaze while seated EOB) Tracking/Visual Pursuits:  (Eyes stay at midline)   Cognition  Overall Cognitive Status: Impaired Area of Impairment: Attention;Following commands;Safety/judgement;Awareness of errors;Awareness of deficits;Problem solving Arousal/Alertness: Other (comment) (keeps eyes closed, responds verbally with incr time) Behavior During Session: Restless Current Attention Level: Sustained (up to 10-15 seconds at most) Following Commands: Follows one step commands inconsistently Safety/Judgement: Decreased awareness of need for assistance;Decreased safety judgement for tasks assessed Awareness of Errors: Assistance required to identify errors made;Assistance required to correct errors made Problem Solving: unable to problem solve tasks       Mobility Bed Mobility Bed Mobility: Rolling Right;Right Sidelying to Sit;Sitting - Scoot to [place] of Bed Rolling Right: 2: Max assist;With rail Right Sidelying to Sit: 1: +2 Total assist;HOB elevated Right Sidelying to Sit: Patient Percentage: 0% Sitting - Scoot to Edge of Bed: 2: Max assist Details for Bed Mobility Assistance: pt assisted with rolling Rt by reaching with LUE and bringing legs over, however still required max assist; pt initiated moving legs over EOB, however made no other effort to come to sitting Transfers Sit to Stand: 1: +2 Total assist;Without upper extremity  assist;From bed Sit to Stand: Patient Percentage: 60% Stand to Sit: 1: +2 Total assist;To bed;To chair/3-in-1 Stand to Sit: Patient Percentage: 60% Details for Transfer Assistance: sit to stand x 2; pt assists with movement once initiated; pt incontinent of urine each time; pt advancing feet herself as guided to pivot to chair           Balance Balance Balance Assessed: Yes Static Sitting Balance Static Sitting - Balance Support: No upper extremity supported;Feet supported Static Sitting - Level of Assistance: 3: Mod assist Static Sitting - Comment/# of Minutes: mod assist initially, however progressed to close supervision for up to 20 seconds Static Standing Balance Static Standing - Balance Support: Bilateral upper extremity supported Static Standing - Level of Assistance: 1: +2 Total assist;Patient percentage (comment) (pt=80%) Static Standing - Comment/# of Minutes: up to 1 minute; knees flexed and flexed trunk/forward head,   End of Session OT - End of Session Equipment Utilized During Treatment: Gait belt Activity Tolerance: Patient limited by fatigue Patient left: in chair Nurse Communication: Mobility status (+2 A stand pivot)       Evette Georges [phone] 08/23/2012, 3:55 PM

## 2012-08-23 NOTE — Progress Notes (Signed)
Nutrition Follow-up  Pt meets criteria for severe malnutrition in the context of acute injury (new hemorrhage) as evidenced by 4% weight loss x 2 weeks, severe wasting of temples and estimated intake </= 50% of needs for >/= 5 days.   Intervention:    Initiate Jevity 1.2 @ 20 ml/hr via NGT and increase by 10 ml every 12 hours to goal rate of 50 ml/hr.  At goal rate, tube feeding regimen will provide 1440 kcal, 67 grams of protein, and 972 ml of H2O. Slow TF advancement to prevent refeeding syndrome.  Monitor refeeding labs (Phosphorus and Magnesium daily x 3 days), MD to replete as needed.   Recommend free water flushes: 140 ml H2O every 6 hours once IVF's are d/c'ed   Assessment:   Pt with R posterior temporoparietal intraparenchymal hemorrhage consistent with an event occuring one week ago. Pt with respiratory failure and emergent intubation. Pt extubated on 08/21/12. Pt remains to lethargic to pass swallow eval. Nasoenteric feeding tube placed. Consult received to start nutrition. Per MD not pt has had some fevers but not sepsis. No family present at this time. Pt unable to answer any questions.   Diet Order:  NPO  Meds: Scheduled Meds:   . antiseptic oral rinse  15 mL Mouth Rinse QID  . chlorhexidine  15 mL Mouth Rinse BID  . famotidine (PEPCID) IV  20 mg Intravenous Q12H  . insulin aspart  0-15 Units Subcutaneous Q4H  . levetiracetam  250 mg Intravenous BID  . LORazepam  0.5 mg Intravenous Once  . potassium chloride  10 mEq Intravenous Q1 Hr x 5  . DISCONTD: levetiracetam  750 mg Intravenous BID  . DISCONTD: levetiracetam  750 mg Intravenous BID   Continuous Infusions:   . dextrose 5 % and 0.9% NaCl    . DISCONTD: dextrose 5 % and 0.45 % NaCl with KCl 40 mEq/L 75 mL/hr at 08/22/12 1630   PRN Meds:.acetaminophen, fentaNYL, labetalol, metoprolol, ondansetron (ZOFRAN) IV, DISCONTD: labetalol  Labs:  CMP     Component Value Date/Time   NA 139 08/23/2012 0235   K 3.2*  08/23/2012 0235   CL 104 08/23/2012 0235   CO2 25 08/23/2012 0235   GLUCOSE 109* 08/23/2012 0235   BUN 7 08/23/2012 0235   CREATININE 1.13* 08/23/2012 0235   CREATININE 1.34* 08/16/2012 1614   CALCIUM 7.7* 08/23/2012 0235   PROT 5.1* 08/21/2012 0602   ALBUMIN 2.3* 08/21/2012 0602   AST 52* 08/21/2012 0602   ALT 26 08/21/2012 0602   ALKPHOS 66 08/21/2012 0602   BILITOT 0.3 08/21/2012 0602   GFRNONAA 48* 08/23/2012 0235   GFRAA 55* 08/23/2012 0235   CBG (last 3)   Basename 08/23/12 0356 08/22/12 2342 08/22/12 2015  GLUCAP 167* 163* 157*   Sodium  Date/Time Value Range Status  08/23/2012  2:35 AM 139  135 - 145 mEq/L Final  08/22/2012  4:17 AM 142  135 - 145 mEq/L Final  08/21/2012  6:02 AM 137  135 - 145 mEq/L Final     DELTA CHECK NOTED    Potassium  Date/Time Value Range Status  08/23/2012  2:35 AM 3.2* 3.5 - 5.1 mEq/L Final     DELTA CHECK NOTED  08/22/2012  4:17 AM 3.9  3.5 - 5.1 mEq/L Final  08/21/2012  6:02 AM 2.7* 3.5 - 5.1 mEq/L Final     DELTA CHECK NOTED     CRITICAL RESULT CALLED TO, READ BACK BY AND VERIFIED WITH:  SHELTON M,RN 08/21/12 0652 WAYK    No results found for this basename: phos    Magnesium  Date/Time Value Range Status  07/26/2012  8:28 AM 1.4* 1.5 - 2.5 mg/dL Final     Intake/Output Summary (Last 24 hours) at 08/23/12 1105 Last data filed at 08/23/12 1039  Gross per 24 hour  Intake   1675 ml  Output    125 ml  Net   1550 ml    Weight Status:  109 lbs on admission 10/16 116 lbs 10/21 116 lbs 10/20  Re-estimated needs:  1400-1600 kcal; 65-75 grams protein; >1.5 L/day  Nutrition Dx:  Inadequate oral intake r/t inability to eat AEB NPO status; ongoing.   Goal: Pt to meet >/= 90% of their estimated nutrition needs, not met.   Monitor:  TF tolerance, weight, labs  Kendell Bane RD, LDN, CNSC [phone] Pager [phone] After Hours Pager

## 2012-08-23 NOTE — Progress Notes (Signed)
New NG tube placed because patient pulled NG out. Placement confirmed by auscultation by two nurses. Daphene Calamity and myself

## 2012-08-23 NOTE — Progress Notes (Signed)
Physical Therapy Treatment Patient Details Name: [name] [name] MRN: [id number] DOB: 1941/11/01 Today's Date: 08/23/2012 Time: 4098-1191 PT Time Calculation (min): 33 min  PT Assessment / Plan / Recommendation Comments on Treatment Session  Pt is s/p subacute Rt. intraparenchymal bleed in parietal area. Pt with decr cognition with decr arousal, however is able to participate as guided. Unclear what pt's baseline was PTA as no family has been present.    Follow Up Recommendations  Post acute inpatient;Supervision/Assistance - 24 hour     Does the patient have the potential to tolerate intense rehabilitation  Yes, Recommend IP Rehab Screening  Barriers to Discharge        Equipment Recommendations  Other (comment) (TBD)    Recommendations for Other Services    Frequency Min 4X/week   Plan Discharge plan remains appropriate;Frequency remains appropriate    Precautions / Restrictions Precautions Precautions: Fall   Pertinent Vitals/Pain None indicated    Mobility  Bed Mobility Bed Mobility: Rolling Right;Right Sidelying to Sit;Sitting - Scoot to [place] of Bed Rolling Right: 2: Max assist;With rail Right Sidelying to Sit: 1: +2 Total assist;HOB elevated Right Sidelying to Sit: Patient Percentage: 0% Sitting - Scoot to Edge of Bed: 2: Max assist Details for Bed Mobility Assistance: pt assisted with rolling Rt by reaching with LUE and bringing legs over, however still required max assist; pt initiated moving legs over EOB, however made no other effort to come to sitting Transfers Transfers: Sit to Stand;Stand to Sit;Stand Pivot Transfers Sit to Stand: 1: +2 Total assist;Without upper extremity assist;From bed Sit to Stand: Patient Percentage: 60% Stand to Sit: 1: +2 Total assist;To bed;To chair/3-in-1 Stand to Sit: Patient Percentage: 60% Stand Pivot Transfers: 1: +2 Total assist Stand Pivot Transfers: Patient Percentage: 70% Details for Transfer Assistance: sit to stand x 2; pt  assists with movement once initiated; pt incontinent of urine each time; pt advancing feet herself as guided to pivot to chair Modified Rankin (Stroke Patients Only) Pre-Morbid Rankin Score: No symptoms Modified Rankin: Moderately severe disability    Exercises     PT Diagnosis:    PT Problem List:   PT Treatment Interventions:     PT Goals Acute Rehab PT Goals Pt will go Supine/Side to Sit: with modified independence PT Goal: Supine/Side to Sit - Progress: Progressing toward goal Pt will go Sit to Stand: with modified independence PT Goal: Sit to Stand - Progress: Progressing toward goal Pt will go Stand to Sit: with modified independence PT Goal: Stand to Sit - Progress: Progressing toward goal Pt will Transfer Bed to Chair/Chair to Bed: with supervision PT Transfer Goal: Bed to Chair/Chair to Bed - Progress: Progressing toward goal  Visit Information  Last PT Received On: 08/23/12 Assistance Needed: +2 PT/OT Co-Evaluation/Treatment: Yes    Subjective Data  Subjective: "[name] [name]" "daughter.Marland KitchenMarland KitchenStephanie" Pt not initiating conversation, however answering with delay Patient Stated Goal: unable to state   Cognition  Overall Cognitive Status: Impaired Area of Impairment: Attention;Following commands;Safety/judgement;Awareness of errors;Awareness of deficits;Problem solving Arousal/Alertness: Other (comment) (keeps eyes closed, responds verbally with incr time) Behavior During Session: Restless Current Attention Level: Sustained (up to 10-15 seconds at most) Following Commands: Follows one step commands inconsistently Safety/Judgement: Decreased awareness of need for assistance;Decreased safety judgement for tasks assessed Awareness of Errors: Assistance required to identify errors made;Assistance required to correct errors made Problem Solving: unable to problem solve tasks    Balance  Balance Balance Assessed: Yes Static Sitting Balance Static Sitting - Balance Support: No  upper extremity supported;Feet supported Static Sitting - Level of Assistance: 3: Mod assist Static Sitting - Comment/# of Minutes: mod assist initially, however progressed to close supervision for up to 20 seconds Static Standing Balance Static Standing - Balance Support: Bilateral upper extremity supported Static Standing - Level of Assistance: 1: +2 Total assist;Patient percentage (comment) (pt=80%) Static Standing - Comment/# of Minutes: up to 1 minute; knees flexed and flexed trunk/forward head,  End of Session PT - End of Session Equipment Utilized During Treatment: Gait belt Activity Tolerance: Patient limited by fatigue Patient left: in chair;with call bell/phone within reach Nurse Communication: Mobility status   GP     Rily Nickey 08/23/2012, 3:43 PM Pager [phone]

## 2012-08-23 NOTE — Progress Notes (Signed)
UR complete 

## 2012-08-23 NOTE — Progress Notes (Signed)
Portable EEG completed

## 2012-08-23 NOTE — Evaluation (Signed)
Clinical/Bedside Swallow Evaluation Patient Details  Name: [name] [name] MRN: [id number] Date of Birth: 01-14-1941  Today's Date: 08/23/2012 Time: 4098-1191 SLP Time Calculation (min): 8 min  Past Medical History:  Past Medical History  Diagnosis Date  . Diabetes mellitus   . GERD (gastroesophageal reflux disease)    Past Surgical History: History reviewed. No pertinent past surgical history. HPI:  71 y/o with h/x of GERD, DM. Admitted to [facility] after MRI showed subacute R. intraparenchymal bleed in parietal area. CXR showed mild left lower lobe airspace disease (could represent atelectisis, raises concern for infection or less likely aspiration). CT showed acute parenchymal hemorrhage in right occipital lobe.    Assessment / Plan / Recommendation Clinical Impression  Pt. seen for bedside swallow evaluation. She is extremely lethargic requiring total verbal/tactile cueing to consume PO. Oral phase characterized by decreased labial seal around spoon, impaired lingual manipulation and, poor awareness of bolus. Pharyngeal phase marked by decreased laryngeal elevation and suspected delayed swallow initiation. No outward s/s of aspiration observed at bedside. Given Pt.'s decreased LOA, continue NPO until able to consume POs with increased alertness. Will f/u to  determine swallow safety and recommend diet/liquids.     Aspiration Risk  Severe    Diet Recommendation NPO   Medication Administration: Via alternative means    Other  Recommendations Oral Care Recommendations: Oral care BID;Staff/trained caregiver to provide oral care   Follow Up Recommendations  Inpatient Rehab    Frequency and Duration min 2x/week  2 weeks       SLP Swallow Goals Goal #3: Pt. will consume PO trials from SLP with minimal s/s of aspiration with moderate verbal cues.  Goal #4: Pt. will sustain attention for 3 minutes while consuming PO trials with min verbal cueing    Swallow Study Prior Functional Status      General Date of Onset: 08/18/12 HPI: 71 y/o with h/x of GERD, DM. Admitted to [facility] after MRI showed subacute R. intraparenchymal bleed in parietal area. CXR showed mild left lower lobe airspace disease (could represent atelectisis, raises concern for infection or less likely aspiration). CT showed acute parenchymal hemorrhage in right occipital lobe.  Type of Study: Bedside swallow evaluation Diet Prior to this Study: NPO Temperature Spikes Noted: No Respiratory Status: Supplemental O2 delivered via (comment) History of Recent Intubation: Yes Length of Intubations (days):  (2 days) Date extubated: 08/21/12 Behavior/Cognition: Lethargic;Requires cueing;Decreased sustained attention Oral Cavity - Dentition: Adequate natural dentition Self-Feeding Abilities: Total assist Patient Positioning: Upright in bed Baseline Vocal Quality: Low vocal intensity;Hoarse Volitional Cough: Cognitively unable to elicit Volitional Swallow: Unable to elicit    Oral/Motor/Sensory Function Overall Oral Motor/Sensory Function: Other (comment) (Pt. unable to participate due to lethargy)   Ice Chips Ice chips: Impaired Oral Phase Impairments: Reduced labial seal;Reduced lingual movement/coordination;Impaired anterior to posterior transit;Poor awareness of bolus Pharyngeal Phase Impairments: Suspected delayed Swallow;Decreased hyoid-laryngeal movement   Thin Liquid Thin Liquid: Not tested    Nectar Thick Nectar Thick Liquid: Not tested   Honey Thick Honey Thick Liquid: Not tested   Puree Puree: Not tested   Solid       Solid: Not tested       [name] [name], [name] [name] 08/23/2012,11:49 AM

## 2012-08-24 ENCOUNTER — Inpatient Hospital Stay (HOSPITAL_COMMUNITY): Payer: Medicare Other

## 2012-08-24 DIAGNOSIS — G40309 Generalized idiopathic epilepsy and epileptic syndromes, not intractable, without status epilepticus: Secondary | ICD-10-CM

## 2012-08-24 DIAGNOSIS — R1310 Dysphagia, unspecified: Secondary | ICD-10-CM

## 2012-08-24 LAB — GLUCOSE, CAPILLARY
Glucose-Capillary: 177 mg/dL — ABNORMAL HIGH (ref 70–99)
Glucose-Capillary: 212 mg/dL — ABNORMAL HIGH (ref 70–99)
Glucose-Capillary: 222 mg/dL — ABNORMAL HIGH (ref 70–99)
Glucose-Capillary: 250 mg/dL — ABNORMAL HIGH (ref 70–99)
Glucose-Capillary: 293 mg/dL — ABNORMAL HIGH (ref 70–99)
Glucose-Capillary: 350 mg/dL — ABNORMAL HIGH (ref 70–99)
Glucose-Capillary: 378 mg/dL — ABNORMAL HIGH (ref 70–99)

## 2012-08-24 LAB — CBC
HCT: 34.6 % — ABNORMAL LOW (ref 36.0–46.0)
Hemoglobin: 11.5 g/dL — ABNORMAL LOW (ref 12.0–15.0)
MCH: 31.2 pg (ref 26.0–34.0)
MCHC: 33.2 g/dL (ref 30.0–36.0)
MCV: 93.8 fL (ref 78.0–100.0)
Platelets: 184 10*3/uL (ref 150–400)
RBC: 3.69 MIL/uL — ABNORMAL LOW (ref 3.87–5.11)
RDW: 12.9 % (ref 11.5–15.5)
WBC: 16.8 10*3/uL — ABNORMAL HIGH (ref 4.0–10.5)

## 2012-08-24 LAB — BASIC METABOLIC PANEL
BUN: 13 mg/dL (ref 6–23)
Calcium: 7.7 mg/dL — ABNORMAL LOW (ref 8.4–10.5)
Creatinine, Ser: 1.16 mg/dL — ABNORMAL HIGH (ref 0.50–1.10)
GFR calc non Af Amer: 46 mL/min — ABNORMAL LOW (ref >90)
Glucose, Bld: 287 mg/dL — ABNORMAL HIGH (ref 70–99)

## 2012-08-24 LAB — PHOSPHORUS: Phosphorus: 2.4 mg/dL (ref 2.3–4.6)

## 2012-08-24 LAB — PHENYTOIN LEVEL, FREE AND TOTAL
Phenytoin, Free: 2.2 mg/L — ABNORMAL HIGH (ref 1.0–2.0)
Phenytoin, Total: 13.7 mg/L (ref 10.0–20.0)

## 2012-08-24 MED ORDER — HALOPERIDOL LACTATE 5 MG/ML IJ SOLN
INTRAMUSCULAR | Status: AC
  Filled 2012-08-24: qty 1

## 2012-08-24 MED ORDER — HALOPERIDOL LACTATE 5 MG/ML IJ SOLN
3.0000 mg | Freq: Once | INTRAMUSCULAR | Status: AC
  Administered 2012-08-24: 3 mg via INTRAVENOUS

## 2012-08-24 MED ORDER — MAGNESIUM SULFATE 40 MG/ML IJ SOLN
2.0000 g | Freq: Once | INTRAMUSCULAR | Status: AC
  Administered 2012-08-24: 2 g via INTRAVENOUS
  Filled 2012-08-24: qty 50

## 2012-08-24 NOTE — Progress Notes (Signed)
Speech Language Pathology Dysphagia Treatment Patient Details Name: [name] [name] MRN: [id number] DOB: 1941-03-28 Today's Date: 08/24/2012 Time: 1005-1020 SLP Time Calculation (min): 15 min  Assessment / Plan / Recommendation Clinical Impression  Pt. seen for dysphagia treatment, she continues to be lethargic but LOA has significantly improved from yesterday. Oral phase marked by decreased labial seal around spoon and delayed oral transit. Pharyngeal phase characterized by suspected delay in swallow initiation, immediate/ delayed throat clears indicative of decreased airway protection, decreased laryngeal elevation, and multiple/audible swallows suggestive of pharyngeal residue and decreased swallow coordination. Given clinical s/s of aspiration, SLP recommends MBS to objectively determine swallow safety. Continue NPO until MBS scheduled for 1100 today.     Diet Recommendation  Continue with Current Diet: NPO    SLP Plan MBS      Swallowing Goals  SLP Swallowing Goals Goal #3: Pt. will consume PO trials from SLP with minimal s/s of aspiration with moderate verbal cues.  Swallow Study Goal #3 - Progress: Progressing toward goal Goal #4: Pt. will sustain attention for 3 minutes while consuming PO trials with min verbal cueing  Swallow Study Goal #4 - Progress: Progressing toward goal  General Temperature Spikes Noted: No Respiratory Status: Supplemental O2 delivered via (comment) Behavior/Cognition: Cooperative;Lethargic;Requires cueing;Decreased sustained attention Oral Cavity - Dentition: Adequate natural dentition Patient Positioning: Upright in bed  Oral Cavity - Oral Hygiene Does patient have any of the following "at risk" factors?: Lips - dry, cracked;Tongue - coated Brush patient's teeth BID with toothbrush (using toothpaste with fluoride): Yes Patient is HIGH RISK - Oral Care Protocol followed (see row info): Yes Patient is AT RISK - Oral Care Protocol followed (see row info):  Yes Patient is mechanically ventilated, follow VAP prevention protocol for oral care: Oral care provided every 4 hours   Dysphagia Treatment Treatment focused on: Upgraded PO texture trials;Patient/family/caregiver education Treatment Methods/Modalities: Skilled observation Patient observed directly with PO's: Yes Type of PO's observed: Dysphagia 1 (puree);Thin liquids;Ice chips Feeding: Total assist Liquids provided via: Teaspoon Oral Phase Signs & Symptoms: Other (comment);Prolonged oral phase (decreased labial seal around spoon ) Pharyngeal Phase Signs & Symptoms: Suspected delayed swallow initiation;Audible swallow;Multiple swallows;Immediate throat clear;Delayed throat clear;Delayed cough;Immediate cough Type of cueing: Verbal;Tactile;Visual Amount of cueing: Maximal   GO     [name] [name], [name] [name] 08/24/2012, 11:01 AM

## 2012-08-24 NOTE — Progress Notes (Signed)
Inpatient Diabetes Program Recommendations  AACE/ADA: New Consensus Statement on Inpatient Glycemic Control (2013)  Target Ranges:  Prepandial:   less than 140 mg/dL      Peak postprandial:   less than 180 mg/dL (1-2 hours)      Critically ill patients:  140 - 180 mg/dL   Reason for Visit: Results for [name] [name], [name] [name] (MRN [id number]) as of 08/24/2012 13:25  Ref. Range 08/24/2012 04:04 08/24/2012 07:44 08/24/2012 07:51  Glucose-Capillary Latest Range: 70-99 mg/dL 409 (H) 811 (H) 914 (H)   Note CBG's greater than goal.  Please restart patient's home dose of Levemir 15 units daily. Also while on continuous tube feeds may consider adding Novolog tube feed coverage 3 units q 4 hours (Hold if tube feeds held).  Will follow.

## 2012-08-24 NOTE — Procedures (Signed)
SLP has reviewed and agrees with student's note below.  Tranice Laduke Willis Daliyah Sramek M.Ed CCC-SLP Pager [phone]  08/24/2012   

## 2012-08-24 NOTE — Progress Notes (Signed)
CRITICAL VALUE ALERT  Critical value received:  Mg 0.7  Date of notification:  08/24/12  Time of notification:  0655  Critical value read back:yes  Nurse who received alert:  Verlin Dike  MD notified (1st page): Dr. Darrick Penna  Time of first page:  [phone]  MD notified (2nd page):   Time of second page:  Responding MD: Dr. Darrick Penna  Time MD responded:  [phone]

## 2012-08-24 NOTE — Progress Notes (Signed)
SLP has reviewed and agrees with student's note below.  Raechal Raben Willis Karene Bracken M.Ed CCC-SLP Pager [phone]  08/24/2012   

## 2012-08-24 NOTE — Procedures (Signed)
Objective Swallowing Evaluation: Modified Barium Swallowing Study  Patient Details  Name: [name] [name] MRN: [id number] Date of Birth: Mar 11, 1941  Today's Date: 08/24/2012 Time: 1191-4782 SLP Time Calculation (min): 20 min  Past Medical History:  Past Medical History  Diagnosis Date  . Diabetes mellitus   . GERD (gastroesophageal reflux disease)    Past Surgical History: History reviewed. No pertinent past surgical history. HPI:  71 y/o with h/x of GERD, DM. Admitted to [facility] after MRI showed subacute R. intraparenchymal bleed in parietal area. CXR showed mild left lower lobe airspace disease (could represent atelectisis, raises concern for infection or less likely aspiration). CT showed acute parenchymal hemorrhage in right occipital lobe.      Assessment / Plan / Recommendation Clinical Impression  Dysphagia Diagnosis: Mild oral phase dysphagia;Moderate pharyngeal phase dysphagia Clinical impression: Pt. presents with motor based oral phase dysphagia and moderate motor/sensory based pharyngeal dysphagia. Dysphagia symptoms appear to stem from recent CVA and current lethargy. Oral phase symptoms characterized by weak lingual manipulation and min-mod lingual residue. Pharyngeal phase marked by delay in swallow initiation to the pyriform sinuses, reduced epiglottic inversion (slightly exacerbated by NG tube), mod vallecular residue caused by decreased tongue base retraction, and reduced laryngeal elevation resulting in mod pyriform sinus residue. Penetration observed with nectar consistency both during and after the swallow with mild throat clear and cough interrmittently.  Given decreased LOA and significant pharyngeal residue with inability to effectively reduce, recommend continuing NPO until LOA increases and overall strength improves. Will f/u to determine readiness for repeat MBS ( MBS likely in 3-5 days depending on  progress).    Treatment Recommendation  Therapy as outlined in treatment  plan below    Diet Recommendation NPO   Medication Administration: Via alternative means    Other  Recommendations Oral Care Recommendations: Oral care BID   Follow Up Recommendations  Inpatient Rehab    Frequency and Duration min 2x/week  2 weeks       SLP Swallow Goals Goal #3: Pt. will consume PO trials from SLP with minimal s/s of aspiration with moderate verbal cues.  Swallow Study Goal #3 - Progress: Progressing toward goal Goal #4: Pt. will sustain attention for 3 minutes while consuming PO trials with min verbal cueing  Swallow Study Goal #4 - Progress: Progressing toward goal   General Date of Onset: 08/18/12 HPI: 71 y/o with h/x of GERD, DM. Admitted to [facility] after MRI showed subacute R. intraparenchymal bleed in parietal area. CXR showed mild left lower lobe airspace disease (could represent atelectisis, raises concern for infection or less likely aspiration). CT showed acute parenchymal hemorrhage in right occipital lobe.  Type of Study: Modified Barium Swallowing Study Reason for Referral: Objectively evaluate swallowing function Previous Swallow Assessment: BSE on 10/21 Diet Prior to this Study: NPO Temperature Spikes Noted: No Respiratory Status: Supplemental O2 delivered via (comment) History of Recent Intubation: Yes Length of Intubations (days): 2 days Date extubated: 08/21/12 Behavior/Cognition: Cooperative;Lethargic;Requires cueing;Decreased sustained attention Oral Cavity - Dentition: Adequate natural dentition Oral Motor / Sensory Function: Impaired - see Bedside swallow eval Self-Feeding Abilities: Total assist Patient Positioning: Upright in chair Baseline Vocal Quality: Low vocal intensity;Hoarse Volitional Cough: Strong Volitional Swallow: Able to elicit Anatomy: Within functional limits Pharyngeal Secretions: Not observed secondary MBS    Reason for Referral Objectively evaluate swallowing function   Oral Phase Oral Preparation/Oral Phase Oral  Phase: Impaired Oral - Honey Oral - Honey Teaspoon: Lingual/palatal residue;Weak lingual manipulation Oral - Nectar Oral - Nectar  Teaspoon: Weak lingual manipulation;Lingual/palatal residue Oral - Thin Oral - Thin Teaspoon: Weak lingual manipulation;Lingual/palatal residue   Pharyngeal Phase Pharyngeal Phase Pharyngeal Phase: Impaired Pharyngeal - Honey Pharyngeal - Honey Teaspoon: Delayed swallow initiation;Premature spillage to valleculae;Reduced epiglottic inversion;Reduced laryngeal elevation;Pharyngeal residue - pyriform sinuses;Pharyngeal residue - valleculae;Reduced tongue base retraction Pharyngeal - Nectar Pharyngeal - Nectar Teaspoon: Delayed swallow initiation;Premature spillage to valleculae;Premature spillage to pyriform sinuses;Reduced laryngeal elevation;Reduced epiglottic inversion;Penetration/Aspiration during swallow;Penetration/Aspiration after swallow;Pharyngeal residue - pyriform sinuses;Pharyngeal residue - valleculae;Reduced tongue base retraction Penetration/Aspiration details (nectar teaspoon): Material enters airway, remains ABOVE vocal cords then ejected out;Material enters airway, remains ABOVE vocal cords and not ejected out (residuals from vallecula falling into vestibule) Pharyngeal - Thin Pharyngeal - Thin Teaspoon: Delayed swallow initiation;Premature spillage to valleculae;Premature spillage to pyriform sinuses;Reduced laryngeal elevation;Reduced epiglottic inversion;Reduced tongue base retraction;Pharyngeal residue - pyriform sinuses;Pharyngeal residue - valleculae  Cervical Esophageal Phase    GO              Theotis Burrow 08/24/2012, 12:56 PM

## 2012-08-24 NOTE — Progress Notes (Signed)
Stroke Team Progress Note  HISTORY Tasha Diaz is an 71 y.o. Female complains of headache and nausea and vomiting in early Oct, 2013. later her headache got worse and she started becoming confused.  She runs into things on the left, stumbling, and just not seeming like herself.   She was told to take aspirin every 6 hours along with Tylenol and has been doing so for the past 2 days. She was not taking regular aspirin prior to her onset of symptoms.   CT head showed  acute parenchymal  hemorrhage in the right occipital lobe just below the occipital  horn of the right lateral ventricle.   She had seizure in 10/18,EEG twice, 10/18, moderately severe, continuoues generalized slowing.  Keppra was given at 10/18, 3am.  Repeat 10/19: much improved, mild generalized slowing.    SUBJECTIVE  Feels like improving. No noted seizure activity   OBJECTIVE Most recent Vital Signs: Filed Vitals:   08/24/12 0400 08/24/12 0500 08/24/12 0600 08/24/12 0700  BP: 144/92 138/76 140/75 144/77  Pulse: 126 116 127 126  Temp: 98.7 F (37.1 C)     TempSrc: Oral     Resp: 19 28  26   Height:      Weight: 53.7 kg (118 lb 6.2 oz)     SpO2: 94% 93% 95% 96%   CBG (last 3)   Basename 08/24/12 0404 08/24/12 0017 08/23/12 2009  GLUCAP 226* 222* 250*    IV Fluid Intake:      . dextrose 5 % and 0.9% NaCl 50 mL/hr at 08/24/12 0700  . feeding supplement (JEVITY 1.2 CAL) 20 mL/hr (08/23/12 1321)  . DISCONTD: dextrose 5 % and 0.45 % NaCl with KCl 40 mEq/L 75 mL/hr at 08/22/12 1630    MEDICATIONS     . antiseptic oral rinse  15 mL Mouth Rinse QID  . chlorhexidine  15 mL Mouth Rinse BID  . famotidine (PEPCID) IV  20 mg Intravenous Q12H  . insulin aspart  0-15 Units Subcutaneous Q4H  . levetiracetam  250 mg Intravenous BID  . magnesium sulfate 1 - 4 g bolus IVPB  2 g Intravenous Once  . potassium chloride  10 mEq Intravenous Q1 Hr x 5  . DISCONTD: levetiracetam  750 mg Intravenous BID   PRN:  acetaminophen,  fentaNYL, labetalol, metoprolol, ondansetron (ZOFRAN) IV, DISCONTD: labetalol  Diet:  NPO  Activity:   Up with assistance DVT Prophylaxis:  SCDs   CLINICALLY SIGNIFICANT STUDIES Basic Metabolic Panel:   Lab 08/24/12 0505 08/23/12 0235  NA 140 139  K 3.9 3.2*  CL 103 104  CO2 21 25  GLUCOSE 287* 109*  BUN 13 7  CREATININE 1.16* 1.13*  CALCIUM 7.7* 7.7*  MG 0.7* --  PHOS 2.4 --   Liver Function Tests:   Lab 08/21/12 0602 08/20/12 0430  AST 52* 51*  ALT 26 29  ALKPHOS 66 85  BILITOT 0.3 0.5  PROT 5.1* 6.5  ALBUMIN 2.3* 3.2*   CBC:   Lab 08/24/12 0505 08/23/12 0235 08/21/12 0602 08/18/12 1818  WBC 16.8* 18.8* -- --  NEUTROABS -- -- 8.5* 6.5  HGB 11.5* 11.0* -- --  HCT 34.6* 32.6* -- --  MCV 93.8 93.7 -- --  PLT 184 178 -- --   Coagulation:   Lab 08/18/12 1818  LABPROT 12.4  INR 0.93   Cardiac Enzymes:   Lab 08/20/12 0430  CKTOTAL --  CKMB --  CKMBINDEX --  TROPONINI <0.30   Urinalysis:   Lab  08/19/12 2150 08/18/12 2129  COLORURINE YELLOW YELLOW  LABSPEC 1.011 1.013  PHURINE 6.5 6.0  GLUCOSEU NEGATIVE 100*  HGBUR TRACE* NEGATIVE  BILIRUBINUR NEGATIVE NEGATIVE  KETONESUR 15* NEGATIVE  PROTEINUR NEGATIVE NEGATIVE  UROBILINOGEN 1.0 0.2  NITRITE NEGATIVE NEGATIVE  LEUKOCYTESUR NEGATIVE NEGATIVE    HgbA1C  Lab Results  Component Value Date   HGBA1C 7.4* 08/19/2012   CT of the brain   08/23/2012 : 08/22/2012 Stable appearance of the brain since the prior study. No appreciable change in the intra-axial hemorrhage. 08/20/2012  1.  Stable appearance of the brain, with acute parenchymal hemorrhage in the right occipital lobe just below the occipital horn of the right lateral ventricle.      08/19/2012  Right posterior temporoparietal intraparenchymal hemorrhage without significant change.  Underlying cerebral atrophy and small vessel ischemic changes.    08/19/2012  Stable right posterior temporo-occipital hemorrhage.   MRI of the brain  08/18/2012  13 x 22 x 14 mm right posterior temporo-occipital intra-axial acute and subacute hemorrhage consistent with an event occurring 1 week ago.  The appearance is nonspecific and could represent hemorrhage is secondary to hypertension, anticoagulation, occult trauma, venous angioma or cavernoma (OCVM), pial AVM, hemorrhagic metastasis, or idiopathic.  Continued surveillance is warranted.   2D echo-     CXR 08/22/2012 1. Status post extubation. 2. Mild left lower lobe airspace disease. While this could represent atelectasis, and raises concern for infection or less likely aspiration.  3. Mild pulmonary vascular congestion 08/20/2012  Endotracheal tube tip projects 2.6 cm proximal to the carina. Otherwise, no interval change.   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  No evidence of active pulmonary disease.    08/18/2012 No acute cardiopulmonary process.    EKG  normal sinus rhythm, PAC's noted.   Therapy Recommendations PT - CIR ; OT - ; ST -   Physical Exam : Cardiac exam no murmur or gallop tachycardia. Lungs are clear to auscultation. Distal pulses are well felt.  Neurological Exam: Patient is, lying in bed.  Pupils are 4 mm equal reactive. Doll's eye movements are present.. Corneal reflexes are present. She has bilateral arm spontaneous movement. Deep tendon flexes are preserved. Both plantars showed withdrawal response.  ASSESSMENT Ms. Diavion Labrador is a 71 y.o. female with acute right posterior temporo-occipital intra-axial hemorrhage. Hemorrhage most likely secondary to hypertension and frequent ingestion of aspirin (new antihypertensives started within the past 1 mo with recent q6h aspirin), On aspirin q 6 hr for headache prior to admission.    New focal seizures 10/17 , EEG showed marked improvement after starting keppra  Respiratory status deteriorated 08/20/2012 am, requiring intubation in setting of elevated temp. Respiratory status deterioration likely due to CNS sedation due to  antiepileptics and/or possible aspiration. Extubated, though remains lethargic, ? Encephalopathic.   Presumed aspiration pneumonia, started on abx  Leukocytosis, wbc 16.6  Headache secondary to hemorrhage  New onset seizures, on keppra  Diabetes, HgbA1c 7.4  Dilantin level low at 5.6, on   GERD  Left hip pain, recent fall per husband. Hip xray cancelled   Creatinine 1.34 Hypertension, new medications added within past 1 mo  Hospital day # 6  TREATMENT/PLAN  Continue keppra at 250mg  bid. 2D echo to rule out possible endocarditis Ok to transfer out of the ICU from neuro standpoint SBP goal < 180   Guy Franco, [facility],  [facility], [place] [facility] Pager: [phone] 08/24/2012 7:25 AM  Scribe for Dr. Delia Heady,  Stroke Center Medical Director. He has personally reviewed chart, pertinent data, examined the patient and developed the plan of care. Pager:  [phone]

## 2012-08-24 NOTE — Procedures (Signed)
EEG NUMBER:  [id number].  REFERRING PHYSICIAN:  Levert Feinstein, MD.  INDICATION FOR STUDY:  Repeat EEG on this 71 year old lady with seizure activity associated with intraparenchymal hemorrhage with reduced level of responsiveness.  DESCRIPTION:  This is a standard EEG recording performed at the patient's bedside in the intensive care unit.  Predominant background activity consisted of low-to-moderate amplitude, 1-2 Hz diffuse delta activity with superimposed 6-7 Hz irregular theta activity symmetrically.  There were frequent moderately high amplitude biphasic and triphasic sharp wave discharges occurring primarily over the left hemisphere with frequent spread to the right hemisphere as well.  The frequency of roughly 1 Hz.  No clinical seizure activity was noted. Photic stimulation produced no appreciable occipital driving response on either side.  Hyperventilation was not performed.  No frank epileptiform discharges were recorded.  INTERPRETATION:  This EEG showed moderately severe generalized slowing of cerebral activity as well as periodic lateralizing epileptiform discharges (PLED's).  Lateralizing discharges of this type are most often seen following cerebral infarctions and are not typically associated with clinical seizure activity.  This is a clear change from her last EEG on November 13, 2011, which showed only mild generalized slowing.     Noel Christmas, MD    ZO:XWRU D:  08/24/2012 07:37:42  T:  08/24/2012 07:51:40  Job #:  [id number]

## 2012-08-24 NOTE — Progress Notes (Signed)
eLink Physician-Brief Progress Note Patient Name: [name] [name] DOB: 18-Jan-1941 MRN: [id number]  Date of Service  08/24/2012   HPI/Events of Note  Agitation, QTC .456  eICU Interventions  Haldol 3 mg IV x1.      YACOUB,WESAM 08/24/2012, 4:51 PM

## 2012-08-24 NOTE — Progress Notes (Signed)
Pt admitted from 3100, husband at bedside, family oriented to unit and routines, safety discussed, pt bil mittens remain on, NGT flushed, and feeding resumed, incontinent of urine, family request that we use diapers and not let her urinate in bed, diaper changed

## 2012-08-24 NOTE — Progress Notes (Signed)
  Echocardiogram 2D Echocardiogram has been performed.  [name] [name] 08/24/2012, 4:02 PM

## 2012-08-24 NOTE — Progress Notes (Signed)
Name: [name] [name] MRN: [id number] DOB: [birth year]    LOS: 6  Referring Provider:  [facility] Reason for Referral:  AMS in setting of altered mental status  PULMONARY / CRITICAL CARE MEDICINE  Brief patient description:  71 y/o female was admitted on 10/17 to [facility] with a subacute intracranial bleed noted on an outpatient MRI ordered for headache.  Within 24 hours of admission she developed fever, seizure, and ongoing confusion. Resp failure , ET 10/18.  Events Since Admission: 10/17 CT head R posterior temporoparietal intraparenchymal hemorrhage  Without significant change 10/17 CT head R posterior temporoparietal intraparenchymal hemorrhage 1.3 x 2.1 cm 10/15 MRI brain 1.3x2.2x1.4cm R posterior temporoparietal intraparenchymal hemorrhage consistent with an event occuring one week ago 10/18- resp failure, intubation emergent, fever 10/18 EEG: This EEG showed moderately severe, continuous, nonspecific, generalized slowing of cerebral activity. This pattern of slowing can be seen with number of different etiologies including degenerative and metabolic encephalopathies as well as postictal state following generalized seizure. No evidence of ongoing seizure activity was seen. 10/19 EEG: This EEG shows mild, generalized, nonspecific, continuous slowing of cerebral activity with evidence of marked improvement compared to previous EEG study on August 19, 2012. No epileptiform discharges were recorded. 10/21 EEG: EEG showed moderately severe generalized slowing of cerebral activity as well as periodic lateralizing epileptiform discharges (PLED's). Lateralizing discharges on this type are most often seen following cerebral infarctions and are not typically associated with clinical seizure activity. This is a clear change from her EEG on November 13, 2011, which showed some mild generalized slowing.    Current Status: Much more awake today 10/22, although still some apparent dysphagia and failure to  interact Participated with swallow eval 10/22 EEG 10/21 as above >> generalized slowing + PLEDs   Vital Signs: Temp:  [98.1 F (36.7 C)-100.4 F (38 C)] 98.1 F (36.7 C) (10/22 0800) Pulse Rate:  [106-131] 122  (10/22 0900) Resp:  [8-39] 29  (10/22 0900) BP: (112-172)/(62-105) 142/66 mmHg (10/22 0900) SpO2:  [93 %-100 %] 97 % (10/22 0900) Weight:  [53.7 kg (118 lb 6.2 oz)] 53.7 kg (118 lb 6.2 oz) (10/22 0400)  Physical Examination: Gen: Extubated, lethargic but follows simple commands HEENT: long neck, rt i j cvl noted PULM: coarse bilat CV: s1 s2 Tachy,  AB: BS+, soft, nontender, no hsm Ext: warm, no edema, no clubbing, no cyanosis Derm: no rash or skin breakdown Neuro: R pupil 4 mm, L pupil 5mm, reactive, follows commands. Lethargic but arousable, follows commands, more awake 10/22   Principal Problem:  *Hemorrhage in the brain-13 x 22 x 14 mm right posterior temporo-occipital intra-axial acute Active Problems:  GERD (gastroesophageal reflux disease)  DM type 2 (diabetes mellitus, type 2)  Dysphagia  HTN (hypertension)  CKD (chronic kidney disease) stage 3, GFR 30-59 ml/min  Leukocytosis  Left hip pain  Left hemiparesis  Hemianopia, homonymous, left  Hemisensory deficit, left  Nausea  Headache  Encephalopathy acute  Acute respiratory failure with hypoxia  Seizure disorder, grand mal  Seizure    ASSESSMENT AND PLAN  NEUROLOGIC  A:   Sub acute intraparenchymal hemorrhage, likely occurred ~ one week prior to admission based on imaging; stable with multiple f/u images. Ongoing encephalopathy of uncertain etiology. Was treated by Neurology through weekend for non-convulsive status >> ? Whether this was medication-related encephalopathy (dilantin and keppra, single dose ativan 10/20pm) in small woman. Has had low grade fever without clear source or other evidence sepsis  P:   -dilantin stopped, keppra dose-adjusted  per neurology on 10/21 with improving MS, no  evidence occult seizure activity on EEG 10/21 -BP control with labetalol, Goal SBP < 140-160 -fever control  PULMONARY  Lab 08/20/12 1230 08/20/12 0835 08/20/12 0015  PHART 7.365 7.502* 7.400  PCO2ART 38.3 26.4* 40.4  PO2ART 194.0* 382.0* 99.3  HCO3 21.4 20.9 24.5*  O2SAT 99.6 99.5 97.2   Ventilator Settings:    ETT:  10-18>> 10/19  A:   Acute resp failure due to neurologic dysfunction (resolved) P:   -Passed SBT 10/19 >> Extubation performed -swallow eval >> recommends MBS 10/22 -pulm toilet   CARDIOVASCULAR  Lab 08/20/12 1149 08/20/12 0430  TROPONINI -- <0.30  LATICACIDVEN 1.6 3.3*  PROBNP -- --   ECG:  Sinus tach, RBBB, nonspecific st wave changes Lines: 10-18 rt ij cvl>>  A: Sinus tach, with fever, SIRS?  Volume deplete from poor po intake? P:  -IVF bolus prn  RENAL  Lab 08/24/12 0505 08/23/12 0235 08/22/12 0417 08/21/12 0602 08/20/12 0420  NA 140 139 142 137 145  K 3.9 3.2* -- -- --  CL 103 104 107 103 107  CO2 21 25 22 25 27   BUN 13 7 7 10 19   CREATININE 1.16* 1.13* 1.06 1.03 1.22*  CALCIUM 7.7* 7.7* 7.7* 6.8* 8.7  MG 0.7* -- -- -- --  PHOS 2.4 -- -- -- --   Intake/Output      10/21 0701 - 10/22 0700 10/22 0701 - 10/23 0700   I.V. (mL/kg) 917.5 (17.1) 100 (1.9)   NG/GT 460 60   IV Piggyback 555 50   Total Intake(mL/kg) 1932.5 (36) 210 (3.9)   Urine (mL/kg/hr)     Total Output     Net +1932.5 +210        Urine Occurrence 6 x    Stool Occurrence 4 x     Foley:  n/a  A:  CKD, Cr near baseline Lab Results  Component Value Date   CREATININE 1.16* 08/24/2012   CREATININE 1.13* 08/23/2012   CREATININE 1.06 08/22/2012   CREATININE 1.34* 08/16/2012   CREATININE 1.45* 08/14/2012    Hypokalemia  Lab 08/24/12 0505 08/23/12 0235 08/22/12 0417  K 3.9 3.2* 3.9   P:   -follow S Cr -IVFs adjusted -Replete K+ as needed   GASTROINTESTINAL  Lab 08/21/12 0602 08/20/12 0430  AST 52* 51*  ALT 26 29  ALKPHOS 66 85  BILITOT 0.3 0.5  PROT  5.1* 6.5  ALBUMIN 2.3* 3.2*    A:  No acute issues P:   Swallow eval >> needs MBS Place NGT for enteral feeds Cont pepcid   HEMATOLOGIC  Lab 08/24/12 0505 08/23/12 0235 08/22/12 0417 08/21/12 0602 08/20/12 0420 08/18/12 1818  HGB 11.5* 11.0* 10.4* 9.6* 12.1 --  HCT 34.6* 32.6* 31.9* 29.6* 36.8 --  PLT 184 178 177 173 245 --  INR -- -- -- -- -- 0.93  APTT -- -- -- -- -- --    A: No acute issues P:  -SCDs in setting of ICH   INFECTIOUS  Lab 08/24/12 0505 08/23/12 0235 08/22/12 0417 08/21/12 0602 08/20/12 0430 08/20/12 0420  WBC 16.8* 18.8* 15.6* 11.2* -- 15.8*  PROCALCITON -- -- 0.12 <0.10 <0.10 --   Cultures: PCT 10/18: < 0/10, 10/19: < 0.10 10/17 blood >> 10/18 blood >>  10/19 blood >>  10/17 urine >> neg  Antibiotics: Amp 10/18 >> 10/19 vanc 10/18 >> 10/19 Acyclovir 10/18 >> 10/19 Ceftriaxone 10/18 >> 10/19   A:  Fever resolved,  UA negative, CXR with LLL atx vs infiltrate P:   -D/C'd abx 10/19 -follow CXR, fever curve   ENDOCRINE  Lab 08/24/12 0751 08/24/12 0744 08/24/12 0404 08/24/12 0017 08/23/12 2009  GLUCAP 350* 378* 226* 222* 250*   A:  DM2, hypoglycemic event P:   -Cont CBGs/SSI if indicated   BEST PRACTICE / DISPOSITION Level of Care:  ICU to SDU 10/22 Primary Service:  PCCM >> to [facility] as of 10/23 Consultants:  Neurology Code Status:  full Diet:  npo DVT Px:  scd GI Px:  pepcid Skin Integrity:  Intact Social / Family:  Reviewed status with husband 10/21and 10/22   Levy Pupa, MD, PhD 08/24/2012, 10:17 AM [facility] [phone] or if no answer [phone]

## 2012-08-24 NOTE — Progress Notes (Signed)
@  1940 E-Link paged to request bilateral wrist restraint order. Pt is consistently pulling at NG Tube through Safety Mittens and per report has removed it at least once on 3100. Pt also trying to remove IV and Monitoring Wires in addition to attempting to exit bed with no assistance. Reorientation ineffective. Order received and bilateral wrist restraints applied to pt at 1941. Explanation of restraints was given to pt prior to application and discontinuation criteria was reviewed. Pt was unable to restate discontinuation criteria. Attempted to call husband and notify of present situation and unsuccessful. AC called and safety sitter requested to substitute restraints but unavailable. Will continue to monitor.

## 2012-08-24 NOTE — Progress Notes (Signed)
Nutrition Follow-up  Pt meets criteria for severe malnutrition in the context of acute injury (new hemorrhage) as evidenced by 4% weight loss x 2 weeks, severe wasting of temples and estimated intake </= 50% of needs for >/= 5 days.   Intervention:    Initiate Jevity 1.2 @ 20 ml/hr via NGT and increase by 10 ml every 12 hours to goal rate of 50 ml/hr.  At goal rate, tube feeding regimen will provide 1440 kcal, 67 grams of protein, and 972 ml of H2O. Slow TF advancement to prevent refeeding syndrome.  Monitor refeeding labs (Phosphorus and Magnesium daily x 3 days), MD to replete as needed.   Recommend free water flushes: 140 ml H2O every 6 hours once IVF's are d/c'ed   Assessment:   Pt with R posterior temporoparietal intraparenchymal hemorrhage consistent with an event occuring one week ago. Pt with respiratory failure and emergent intubation. Pt extubated on 08/21/12. Pt remains too lethargic to pass swallow eval. Nasoenteric feeding tube placed. Consult received to start nutrition. Per MD note pt has had some fevers but not sepsis. No family present at this time. Pt unable to answer any questions.  Noted very low Magnesium and elevated CBG's. TF initiated 10/21. Patient has NGT in place with tip of tube confirmed by nursing. Jevity 1.2 is infusing @ 30 ml/hr. Tube feeding regimen currently providing 864 kcal (62% of estimated needs), 40 grams protein (62% of estimated needs), and 583 ml H2O.   Residuals: 0  Last bm: 08/23/12  Diet Order:  NPO  Meds: Scheduled Meds:    . antiseptic oral rinse  15 mL Mouth Rinse QID  . chlorhexidine  15 mL Mouth Rinse BID  . famotidine (PEPCID) IV  20 mg Intravenous Q12H  . insulin aspart  0-15 Units Subcutaneous Q4H  . levetiracetam  250 mg Intravenous BID  . magnesium sulfate 1 - 4 g bolus IVPB  2 g Intravenous Once  . potassium chloride  10 mEq Intravenous Q1 Hr x 5   Continuous Infusions:    . dextrose 5 % and 0.9% NaCl 50 mL/hr at  08/24/12 1000  . feeding supplement (JEVITY 1.2 CAL) 20 mL/hr (08/23/12 1321)   PRN Meds:.acetaminophen, fentaNYL, labetalol, metoprolol, ondansetron (ZOFRAN) IV  Labs:  CMP     Component Value Date/Time   NA 140 08/24/2012 0505   K 3.9 08/24/2012 0505   CL 103 08/24/2012 0505   CO2 21 08/24/2012 0505   GLUCOSE 287* 08/24/2012 0505   BUN 13 08/24/2012 0505   CREATININE 1.16* 08/24/2012 0505   CREATININE 1.34* 08/16/2012 1614   CALCIUM 7.7* 08/24/2012 0505   PROT 5.1* 08/21/2012 0602   ALBUMIN 2.3* 08/21/2012 0602   AST 52* 08/21/2012 0602   ALT 26 08/21/2012 0602   ALKPHOS 66 08/21/2012 0602   BILITOT 0.3 08/21/2012 0602   GFRNONAA 46* 08/24/2012 0505   GFRAA 54* 08/24/2012 0505   CBG (last 3)   Basename 08/24/12 0751 08/24/12 0744 08/24/12 0404  GLUCAP 350* 378* 226*   Sodium  Date/Time Value Range Status  08/24/2012  5:05 AM 140  135 - 145 mEq/L Final  08/23/2012  2:35 AM 139  135 - 145 mEq/L Final  08/22/2012  4:17 AM 142  135 - 145 mEq/L Final    Potassium  Date/Time Value Range Status  08/24/2012  5:05 AM 3.9  3.5 - 5.1 mEq/L Final     DELTA CHECK NOTED  08/23/2012  2:35 AM 3.2* 3.5 - 5.1 mEq/L Final  DELTA CHECK NOTED  08/22/2012  4:17 AM 3.9  3.5 - 5.1 mEq/L Final    Phosphorus  Date/Time Value Range Status  08/24/2012  5:05 AM 2.4  2.3 - 4.6 mg/dL Final    Magnesium  Date/Time Value Range Status  08/24/2012  5:05 AM 0.7* 1.5 - 2.5 mg/dL Final     REPEATED TO VERIFY     CRITICAL RESULT CALLED TO, READ BACK BY AND VERIFIED WITH:     [facility] K,RN 08/24/12 0656 WAYK  07/26/2012  8:28 AM 1.4* 1.5 - 2.5 mg/dL Final     Intake/Output Summary (Last 24 hours) at 08/24/12 1153 Last data filed at 08/24/12 1000  Gross per 24 hour  Intake   1845 ml  Output      0 ml  Net   1845 ml    Weight Status:  109 lbs on admission 10/16 118 lb 10/22 116 lbs 10/21 116 lbs 10/20  Estimated needs:  1400-1600 kcal; 65-75 grams protein; >1.5 L/day  Nutrition  Dx:  Inadequate oral intake r/t inability to eat AEB NPO status; ongoing.   Goal: Pt to meet >/= 90% of their estimated nutrition needs, not met.   Monitor:  TF tolerance, weight, labs  Kendell Bane RD, LDN, CNSC [phone] Pager [phone] After Hours Pager

## 2012-08-25 ENCOUNTER — Inpatient Hospital Stay (HOSPITAL_COMMUNITY): Payer: Medicare Other

## 2012-08-25 ENCOUNTER — Encounter (HOSPITAL_COMMUNITY): Payer: Self-pay | Admitting: Physical Medicine and Rehabilitation

## 2012-08-25 DIAGNOSIS — I619 Nontraumatic intracerebral hemorrhage, unspecified: Secondary | ICD-10-CM

## 2012-08-25 LAB — BASIC METABOLIC PANEL
BUN: 16 mg/dL (ref 6–23)
CO2: 26 mEq/L (ref 19–32)
Calcium: 8.2 mg/dL — ABNORMAL LOW (ref 8.4–10.5)
Creatinine, Ser: 1.24 mg/dL — ABNORMAL HIGH (ref 0.50–1.10)
GFR calc non Af Amer: 43 mL/min — ABNORMAL LOW (ref >90)
Glucose, Bld: 329 mg/dL — ABNORMAL HIGH (ref 70–99)
Sodium: 148 mEq/L — ABNORMAL HIGH (ref 135–145)

## 2012-08-25 LAB — GLUCOSE, CAPILLARY
Glucose-Capillary: 260 mg/dL — ABNORMAL HIGH (ref 70–99)
Glucose-Capillary: 210 mg/dL — ABNORMAL HIGH (ref 70–99)
Glucose-Capillary: 252 mg/dL — ABNORMAL HIGH (ref 70–99)

## 2012-08-25 LAB — CBC
MCH: 30.9 pg (ref 26.0–34.0)
MCHC: 32.5 g/dL (ref 30.0–36.0)
MCV: 95.1 fL (ref 78.0–100.0)
Platelets: 194 10*3/uL (ref 150–400)
RBC: 3.49 MIL/uL — ABNORMAL LOW (ref 3.87–5.11)
RDW: 13.1 % (ref 11.5–15.5)

## 2012-08-25 MED ORDER — PANTOPRAZOLE SODIUM 40 MG PO PACK
40.0000 mg | PACK | Freq: Every day | ORAL | Status: DC
  Administered 2012-08-25 – 2012-08-27 (×3): 40 mg via ORAL
  Filled 2012-08-25 (×3): qty 20

## 2012-08-25 MED ORDER — SODIUM CHLORIDE 0.9 % IV SOLN
INTRAVENOUS | Status: DC
  Administered 2012-08-25: 1000 mL via INTRAVENOUS

## 2012-08-25 MED ORDER — INSULIN ASPART 100 UNIT/ML ~~LOC~~ SOLN
0.0000 [IU] | Freq: Three times a day (TID) | SUBCUTANEOUS | Status: DC
  Administered 2012-08-26: 5 [IU] via SUBCUTANEOUS
  Administered 2012-08-26: 11 [IU] via SUBCUTANEOUS
  Administered 2012-08-27: 8 [IU] via SUBCUTANEOUS
  Administered 2012-08-27: 5 [IU] via SUBCUTANEOUS

## 2012-08-25 MED ORDER — METOPROLOL TARTRATE 12.5 MG HALF TABLET
12.5000 mg | ORAL_TABLET | Freq: Two times a day (BID) | ORAL | Status: DC
  Administered 2012-08-25: 12.5 mg via ORAL
  Filled 2012-08-25 (×3): qty 1

## 2012-08-25 MED ORDER — POTASSIUM CHLORIDE 20 MEQ/15ML (10%) PO LIQD
40.0000 meq | Freq: Two times a day (BID) | ORAL | Status: DC
  Administered 2012-08-25 – 2012-08-26 (×3): 40 meq via ORAL
  Filled 2012-08-25 (×4): qty 30

## 2012-08-25 MED ORDER — INSULIN DETEMIR 100 UNIT/ML ~~LOC~~ SOLN
15.0000 [IU] | Freq: Every day | SUBCUTANEOUS | Status: DC
  Filled 2012-08-25 (×2): qty 10

## 2012-08-25 MED ORDER — ACETAMINOPHEN 325 MG PO TABS: 650.0000 mg | ORAL_TABLET | Freq: Four times a day (QID) | ORAL | Status: DC | PRN

## 2012-08-25 MED ORDER — STARCH (THICKENING) PO POWD
ORAL | Status: DC | PRN
  Filled 2012-08-25: qty 227

## 2012-08-25 NOTE — Progress Notes (Signed)
Stroke Team Progress Note  HISTORY [name] [name] is an 71 y.o. Female complains of headache and nausea and vomiting in early Oct, 2013. later her headache got worse and she started becoming confused.  She runs into things on the left, stumbling, and just not seeming like herself.   She was told to take aspirin every 6 hours along with Tylenol and has been doing so for the past 2 days. She was not taking regular aspirin prior to her onset of symptoms.   CT head showed  acute parenchymal  hemorrhage in the right occipital lobe just below the occipital  horn of the right lateral ventricle.   She had seizure in 10/18,EEG twice, 10/18, moderately severe, continuoues generalized slowing.  Keppra was given at 10/18, 3am.  Repeat 10/19: much improved, mild generalized slowing.   SUBJECTIVE husband at bedside. Rehab evaluating.  OBJECTIVE Most recent Vital Signs: Filed Vitals:   08/25/12 0400 08/25/12 0415 08/25/12 0500 08/25/12 0735  BP:  145/90  132/75  Pulse: 122 116 105 126  Temp:  98.6 F (37 C)    TempSrc:  Oral    Resp: 27 24 25 28   Height:      Weight:   54 kg (119 lb 0.8 oz)   SpO2: 95% 95% 97% 96%   CBG (last 3)   Basename 08/25/12 0413 08/24/12 2345 08/24/12 1953  GLUCAP 292* 199* 177*   IV Fluid Intake:     . dextrose 5 % and 0.9% NaCl 1,000 mL (08/25/12 0738)  . feeding supplement (JEVITY 1.2 CAL) 1,000 mL (08/24/12 2229)   MEDICATIONS    . antiseptic oral rinse  15 mL Mouth Rinse QID  . chlorhexidine  15 mL Mouth Rinse BID  . famotidine (PEPCID) IV  20 mg Intravenous Q12H  . haloperidol lactate      . haloperidol lactate  3 mg Intravenous Once  . insulin aspart  0-15 Units Subcutaneous Q4H  . levetiracetam  250 mg Intravenous BID  . magnesium sulfate 1 - 4 g bolus IVPB  2 g Intravenous Once   PRN:  acetaminophen, labetalol, metoprolol, ondansetron (ZOFRAN) IV, DISCONTD: fentaNYL  Diet:  NPO  Activity:   Up with assistance DVT Prophylaxis:  SCDs   CLINICALLY  SIGNIFICANT STUDIES Basic Metabolic Panel:   Lab 08/25/12 0517 08/24/12 0505  NA 148* 140  K 3.0* 3.9  CL 109 103  CO2 26 21  GLUCOSE 329* 287*  BUN 16 13  CREATININE 1.24* 1.16*  CALCIUM 8.2* 7.7*  MG 1.5 0.7*  PHOS 2.1* 2.4   Liver Function Tests:   Lab 08/21/12 0602 08/20/12 0430  AST 52* 51*  ALT 26 29  ALKPHOS 66 85  BILITOT 0.3 0.5  PROT 5.1* 6.5  ALBUMIN 2.3* 3.2*   CBC:   Lab 08/25/12 0517 08/24/12 0505 08/21/12 0602 08/18/12 1818  WBC 12.4* 16.8* -- --  NEUTROABS -- -- 8.5* 6.5  HGB 10.8* 11.5* -- --  HCT 33.2* 34.6* -- --  MCV 95.1 93.8 -- --  PLT 194 184 -- --   Coagulation:   Lab 08/18/12 1818  LABPROT 12.4  INR 0.93   Cardiac Enzymes:   Lab 08/20/12 0430  CKTOTAL --  CKMB --  CKMBINDEX --  TROPONINI <0.30   Urinalysis:   Lab 08/19/12 2150 08/18/12 2129  COLORURINE YELLOW YELLOW  LABSPEC 1.011 1.013  PHURINE 6.5 6.0  GLUCOSEU NEGATIVE 100*  HGBUR TRACE* NEGATIVE  BILIRUBINUR NEGATIVE NEGATIVE  KETONESUR 15* NEGATIVE  PROTEINUR  NEGATIVE NEGATIVE  UROBILINOGEN 1.0 0.2  NITRITE NEGATIVE NEGATIVE  LEUKOCYTESUR NEGATIVE NEGATIVE    HgbA1C  Lab Results  Component Value Date   HGBA1C 7.4* 08/19/2012   CT of the brain   08/22/2012 Stable appearance of the brain since the prior study. No appreciable change in the intra-axial hemorrhage. 08/20/2012  1.  Stable appearance of the brain, with acute parenchymal hemorrhage in the right occipital lobe just below the occipital horn of the right lateral ventricle.      08/19/2012  Right posterior temporoparietal intraparenchymal hemorrhage without significant change.  Underlying cerebral atrophy and small vessel ischemic changes.    08/19/2012  Stable right posterior temporo-occipital hemorrhage.   MRI of the brain  08/18/2012 13 x 22 x 14 mm right posterior temporo-occipital intra-axial acute and subacute hemorrhage consistent with an event occurring 1 week ago.  The appearance is nonspecific and  could represent hemorrhage is secondary to hypertension, anticoagulation, occult trauma, venous angioma or cavernoma (OCVM), pial AVM, hemorrhagic metastasis, or idiopathic.  Continued surveillance is warranted.   2D echo 06/24/2012   A sinus tachycardia rhythm was noted on this study, making this study technically difficult for the assessment of cardiac function. No cardiac source of embolism was identified, but cannot be ruled out on the basis of this examination. Normal pulmonary artery pressure.   CXR 08/24/2012 1. Improved aeration at both lung bases with some residual  atelectasis bilaterally, slightly worse on the left. 2. Status post removal of right IJ line without pneumothorax. 3. New NG tube. 08/23/2012 1. Status post extubation. 2. Mild left lower lobe airspace disease. While this could represent atelectasis, and raises concern for infection or less  likely aspiration. 3. Mild pulmonary vascular congestion. 08/22/2012 1. Status post extubation. 2. Mild left lower lobe airspace disease. While this could represent atelectasis, and raises concern for infection or less likely aspiration.  3. Mild pulmonary vascular congestion 08/20/2012  Endotracheal tube tip projects 2.6 cm proximal to the carina. Otherwise, no interval change.   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  No evidence of active pulmonary disease.    08/18/2012 No acute cardiopulmonary process.    EKG  normal sinus rhythm, PAC's noted.   EEG 08/24/2012 moderately severe generalized slowing  of cerebral activity as well as periodic lateralizing epileptiform  discharges (PLED's). Lateralizing discharges of this type are most often seen following cerebral infarctions and are not typically associated with clinical seizure activity. This is a clear change from her last EEG on November 13, 2011, which showed only mild generalized slowing  Therapy Recommendations PT - CIR ; OT - CIR ST -   Physical Exam : Cardiac exam no  murmur or gallop tachycardia. Lungs are clear to auscultation. Distal pulses are well felt.  Neurological Exam: Patient is, lying in bed.  Pupils are 4 mm equal reactive. Doll's eye movements are present.she does not blink to threat on the left but does so on the right. Corneal reflexes are present.she is awake today and interactive. She speak short sentences and few words. She follows commands quite well today. She has bilateral arm and leg spontaneous movement without any focal deficits the. Deep tendon flexes are preserved. Both plantars showed withdrawal response.  ASSESSMENT Ms. Omolola Mittman is a 71 y.o. female with acute right posterior temporo-occipital intra-axial hemorrhage. Hemorrhage most likely secondary to hypertension and frequent ingestion of aspirin (new antihypertensives started within the past 1 mo with recent q6h aspirin), On aspirin q 6  hr for headache prior to admission.    New focal seizures 10/17 , EEG showed marked improvement after starting keppra  Respiratory status deteriorated 08/20/2012 am, requiring intubation in setting of elevated temp. Respiratory status deterioration likely due to CNS sedation due to antiepileptics and/or possible aspiration. Extubated, though remains somewhat  Encephalopathic, though improving.   Presumed aspiration pneumonia, started on abx  Leukocytosis, wbc 12.4  Headache secondary to hemorrhage  New onset seizures, on keppra. Given sensitivity to keppra and antiepileptics, do not want to change current regimen.  Diabetes, HgbA1c 7.4  GERD  Left hip pain, recent fall per husband. Hip xray cancelled   Creatinine 1.34 Hypertension, new medications added within past 1 mo  Hospital day # 7  TREATMENT/PLAN  Continue keppra at 250mg  bid.  Check swallow Ok to transfer out of the ICU from neuro standpoint. Ok for rehab transfer Ongoing SBP goal < 180 If has clinical seizures, would not increase keppra, but add vimpat   Annie Main,  MSN, RN, ANVP-BC, ANP-BC, GNP-BC [facility] Pager: [phone] 08/25/2012 8:38 AM  Scribe for Dr. Delia Heady, Stroke Center Medical Director, who has personally reviewed chart, pertinent data, examined the patient and developed the plan of care. Pager:  [phone]

## 2012-08-25 NOTE — Progress Notes (Signed)
Pt getting progressively more confused and restless throughout night.  @0430  pt endorsed need to void. While placing pt on bedpan, abdomen was found to be notably distended. Bowel sounds were + in all 4 quadrants, NG Tube Residual 10mL. Pt voided approximately . After removing bedpan, bladder scan performed and noted to be "Volume>960mL".  @0445  E-Link paged and CCM on-call, [place], notified of pt's disposition. Order received for Foley placement. 16 French placed and immediately of clear, yellow urine received. Pt notably calmer after Foley placement. Pt still confused and pulling at lines and tubes. Will continue to monitor and assess need for restraints and/or sitter.

## 2012-08-25 NOTE — Consult Note (Signed)
Physical Medicine and Rehabilitation Consult Reason for Consult: IPH with falls and confusion. Referring Physician: Dr. Delton Coombes.   HPI: [name] [name] is a 71 y.o. female with history of DM, fall 10 days PTA on 08/18/12 with worsening of HA and 3 day history of running into things on the left with gait problems. MRI head done on outpatient basis with  right posterior temporo-occipital hemorrhage and patient admitted for workup. Patient developed seizure on 10/17 pm and was loaded with dilantin and started on Keppra. EEG done revealing moderately severe, continuous nonspecific slowing of cerebral activity with question of encephalopathy v/s post ictal state. She did develop fever with hypotension and patient intubated on 10/18. CCM consulted for input and patient started on IV zosyn for presumed aspiration PNA.   Follow up EEG 10/19 with marked improvement. Serial CCT done showing stable appearance of bleed.  Extubated on 10/19 and has been confused with bouts of agitation. keppra decreased to 250 mg bid on 10/21 due to lethargy.  Repeat EEG with moderately severe generalized slowing on 10/22 as well as PLED's. Neurology feels that patient with hemorrhage due to hypertension and excessive ASA use. Patient NPO due to lethargy. PT/OT evaluations done and team recommending CIR for progression.   Review of Systems  HENT: Negative for hearing loss.   Eyes: Positive for blurred vision (extremely poor vision--needs new glasses per husband. ).  Respiratory: Negative for shortness of breath.   Cardiovascular: Negative for chest pain and palpitations.  Gastrointestinal: Negative for heartburn and abdominal pain.  Neurological: Negative for headaches.   Past Medical History  Diagnosis Date  . Diabetes mellitus   . GERD (gastroesophageal reflux disease)    History reviewed. No pertinent past surgical history.  Family History  Problem Relation Age of Onset  . Cancer Mother   . Cancer Father     Social  History:  Married. Homemaker. Did not need AD but extremely poor vision--husband helped with BS/meds. She reports that she has never smoked. She does not have any smokeless tobacco history on file. She reports that she does not drink alcohol or use illicit drugs.   Allergies  Allergen Reactions  . Barbiturates    Medications Prior to Admission  Medication Sig Dispense Refill  . insulin aspart (NOVOLOG) 100 UNIT/ML injection 0-9 Units, Subcutaneous, 3 times daily with meals CBG < 70: implement hypoglycemia protocol, Call MD  CBG 70 - 120: 0 units, CBG 121 - 150: 1 unit, CBG 151 - 200: 2 units, CBG 201 - 250: 3 units, CBG 251 - 300: 5 units, CBG 301 - 350: 7 units, CBG 351 - 400: 9 units, CBG > 400: call MD  1 vial  0  . insulin detemir (LEVEMIR) 100 UNIT/ML injection Inject 15 Units into the skin daily with breakfast.      . metFORMIN (GLUCOPHAGE-XR) 500 MG 24 hr tablet Take 500 mg by mouth daily with breakfast.      . metoprolol tartrate (LOPRESSOR) 25 MG tablet Take 12.5 mg by mouth 2 (two) times daily.      . Multiple Vitamin (MULTIVITAMIN WITH MINERALS) TABS Take 1 tablet by mouth daily.      Marland Kitchen omeprazole (PRILOSEC) 40 MG capsule Take 40 mg by mouth daily.      . vitamin C (ASCORBIC ACID) 500 MG tablet Take 500 mg by mouth daily.        Home: Home Living Lives With: Spouse Available Help at Discharge: Family Additional Comments: No family available to ger  further info from  Functional History: Prior Function Comments: pt not answering questions, unsure of setup Functional Status:  Mobility: Bed Mobility Bed Mobility: Rolling Right;Right Sidelying to Sit;Sitting - Scoot to [place] of Bed Rolling Right: 2: Max assist;With rail Right Sidelying to Sit: 1: +2 Total assist;HOB elevated Right Sidelying to Sit: Patient Percentage: 0% Supine to Sit: 4: Min guard Sitting - Scoot to [place] of Bed: 2: Max assist Transfers Transfers: Sit to Stand;Stand to [employer] Transfers Sit  to Stand: 1: +2 Total assist;Without upper extremity assist;From bed Sit to Stand: Patient Percentage: 60% Stand to Sit: 1: +2 Total assist;To bed;To chair/3-in-1 Stand to Sit: Patient Percentage: 60% Stand Pivot Transfers: 1: +2 Total assist Stand Pivot Transfers: Patient Percentage: 70% Ambulation/Gait Ambulation/Gait Assistance: Not tested (comment)    ADL: ADL Eating/Feeding: NPO Grooming: +1 Total assistance;Simulated Where Assessed - Grooming: Supine, head of bed up Upper Body Bathing: Simulated;+1 Total assistance Where Assessed - Upper Body Bathing: Supine, head of bed up Lower Body Bathing: Simulated;+1 Total assistance Where Assessed - Lower Body Bathing: Supine, head of bed up;Supine, head of bed flat;Rolling right and/or left Upper Body Dressing: Simulated;+1 Total assistance Where Assessed - Upper Body Dressing: Supine, head of bed up Lower Body Dressing: Simulated;+1 Total assistance Where Assessed - Lower Body Dressing: Supine, head of bed up;Supine, head of bed flat;Rolling right and/or left Toilet Transfer: Simulated;+2 Total assistance Toilet Transfer Method: Sit to stand Toilet Transfer Equipment:  (Bed stand pivot to recliner going to her right) Equipment Used: Gait belt Transfers/Ambulation Related to ADLs: total A+2 (60%) sit to stand and stand to sit. (80%) standing  Cognition: Cognition Overall Cognitive Status: Impaired Arousal/Alertness: Other (comment) (keeps eyes closed, responds verbally with incr time) Orientation Level: Oriented to person;Oriented to place;Disoriented to time;Disoriented to situation Attention: Focused;Sustained Focused Attention: Appears intact Sustained Attention: Appears intact Memory: Impaired Memory Impairment: Decreased recall of new information;Decreased short term memory Decreased Short Term Memory: Functional basic;Functional complex Awareness: Impaired Awareness Impairment: Emergent impairment;Anticipatory  impairment;Intellectual impairment Problem Solving: Impaired Problem Solving Impairment: Functional basic;Functional complex Executive Function: Self Monitoring Self Monitoring: Impaired Self Monitoring Impairment: Functional basic;Functional complex Safety/Judgment: Impaired Cognition Overall Cognitive Status: Impaired Area of Impairment: Attention;Following commands;Safety/judgement;Awareness of errors;Awareness of deficits;Problem solving Arousal/Alertness: Other (comment) (keeps eyes closed, responds verbally with incr time) Orientation Level: Other (comment) (unknown as pt not answering) Behavior During Session: Restless Current Attention Level: Sustained (up to 10-15 seconds at most) Attention - Other Comments: pt unable to pay attention at all Following Commands: Follows one step commands inconsistently Safety/Judgement: Decreased awareness of need for assistance;Decreased safety judgement for tasks assessed Awareness of Errors: Assistance required to identify errors made;Assistance required to correct errors made Awareness of Deficits: unaware of deficits Problem Solving: unable to problem solve tasks  Blood pressure 132/75, pulse 126, temperature 98.6 F (37 C), temperature source Oral, resp. rate 28, height 5\' 5"  (1.651 m), weight 54 kg (119 lb 0.8 oz), SpO2 96.00%. Physical Exam  Nursing note and vitals reviewed. Constitutional: She is oriented to person, place, and time. She appears well-developed and well-nourished.  HENT:  Head: Normocephalic and atraumatic.  Eyes:       Left pupil irregular with decreased reflex.   Neck: Normal range of motion. Neck supple.  Cardiovascular: Regular rhythm.  Tachycardia present.   Pulmonary/Chest: Effort normal and breath sounds normal.  Abdominal: Soft. Bowel sounds are normal.  Musculoskeletal: She exhibits no edema.  Neurological: She is alert and oriented to person, place, and  time.       Dysarthric speech.  Left visual fields  deficit.  Able to answer orientation/general question appropriately. Wanted restraints off. Follows basic commands without difficulty.  Occasional bouts of disorientation noted. Once she began to talk, she was much more initiating. Moves right side much more automatically than the left.  Skin: Skin is warm and dry.    Results for orders placed during the hospital encounter of 08/18/12 (from the past 24 hour(s))  GLUCOSE, CAPILLARY     Status: Abnormal   Collection Time   08/24/12 12:28 PM      Component Value Range   Glucose-Capillary 293 (*) 70 - 99 mg/dL  GLUCOSE, CAPILLARY     Status: Abnormal   Collection Time   08/24/12  3:45 PM      Component Value Range   Glucose-Capillary 212 (*) 70 - 99 mg/dL  GLUCOSE, CAPILLARY     Status: Abnormal   Collection Time   08/24/12  7:53 PM      Component Value Range   Glucose-Capillary 177 (*) 70 - 99 mg/dL  GLUCOSE, CAPILLARY     Status: Abnormal   Collection Time   08/24/12 11:45 PM      Component Value Range   Glucose-Capillary 199 (*) 70 - 99 mg/dL   Comment 1 Notify RN     Comment 2 Documented in Chart    GLUCOSE, CAPILLARY     Status: Abnormal   Collection Time   08/25/12  4:13 AM      Component Value Range   Glucose-Capillary 292 (*) 70 - 99 mg/dL   Comment 1 Notify RN     Comment 2 Documented in Chart    MAGNESIUM     Status: Normal   Collection Time   08/25/12  5:17 AM      Component Value Range   Magnesium 1.5  1.5 - 2.5 mg/dL  PHOSPHORUS     Status: Abnormal   Collection Time   08/25/12  5:17 AM      Component Value Range   Phosphorus 2.1 (*) 2.3 - 4.6 mg/dL  BASIC METABOLIC PANEL     Status: Abnormal   Collection Time   08/25/12  5:17 AM      Component Value Range   Sodium 148 (*) 135 - 145 mEq/L   Potassium 3.0 (*) 3.5 - 5.1 mEq/L   Chloride 109  96 - 112 mEq/L   CO2 26  19 - 32 mEq/L   Glucose, Bld 329 (*) 70 - 99 mg/dL   BUN 16  6 - 23 mg/dL   Creatinine, Ser 1.61 (*) 0.50 - 1.10 mg/dL   Calcium 8.2 (*) 8.4  - 10.5 mg/dL   GFR calc non Af Amer 43 (*) >90 mL/min   GFR calc Af Amer 49 (*) >90 mL/min  CBC     Status: Abnormal   Collection Time   08/25/12  5:17 AM      Component Value Range   WBC 12.4 (*) 4.0 - 10.5 K/uL   RBC 3.49 (*) 3.87 - 5.11 MIL/uL   Hemoglobin 10.8 (*) 12.0 - 15.0 g/dL   HCT 09.6 (*) 04.5 - 40.9 %   MCV 95.1  78.0 - 100.0 fL   MCH 30.9  26.0 - 34.0 pg   MCHC 32.5  30.0 - 36.0 g/dL   RDW 81.1  91.4 - 78.2 %   Platelets 194  150 - 400 K/uL  GLUCOSE, CAPILLARY  Status: Abnormal   Collection Time   08/25/12  8:21 AM      Component Value Range   Glucose-Capillary 260 (*) 70 - 99 mg/dL   Comment 1 Documented in Chart     Comment 2 Notify RN       Assessment/Plan: Diagnosis: right temporal occipital hemorrhage 1. Does the need for close, 24 hr/day medical supervision in concert with the patient's rehab needs make it unreasonable for this patient to be served in a less intensive setting? Yes 2. Co-Morbidities requiring supervision/potential complications: dm2, htn, ckd 3. Due to bladder management, bowel management, safety, skin/wound care, disease management, medication administration, pain management and patient education, does the patient require 24 hr/day rehab nursing? Yes 4. Does the patient require coordinated care of a physician, rehab nurse, PT (1-2 hrs/day, 5 days/week), OT (1-2 hrs/day, 5 days/week) and SLP (1-2 hrs/day, 5 days/week) to address physical and functional deficits in the context of the above medical diagnosis(es)? Yes Addressing deficits in the following areas: balance, endurance, locomotion, strength, transferring, bowel/bladder control, bathing, dressing, feeding, grooming and toileting 5. Can the patient actively participate in an intensive therapy program of at least 3 hrs of therapy per day at least 5 days per week? Yes 6. The potential for patient to make measurable gains while on inpatient rehab is excellent 7. Anticipated functional  outcomes upon discharge from inpatient rehab are min assist with PT, min to mod assist with OT, supervision to min assist with SLP. 8. Estimated rehab length of stay to reach the above functional goals is: 3 weeks 9. Does the patient have adequate social supports to accommodate these discharge functional goals? Yes 10. Anticipated D/C setting: Home 11. Anticipated post D/C treatments: Outpt therapy 12. Overall Rehab/Functional Prognosis: excellent  RECOMMENDATIONS: This patient's condition is appropriate for continued rehabilitative care in the following setting: CIR Patient has agreed to participate in recommended program. Yes Note that insurance prior authorization may be required for reimbursement for recommended care.  Comment: Rehab RN to follow up.   Ivory Broad, MD (This pt was seen on 08/25/12)    08/25/2012

## 2012-08-25 NOTE — Progress Notes (Signed)
Physical Therapy Treatment Patient Details Name: [name] [name] MRN: [id number] DOB: 21-Aug-1941 Today's Date: 08/25/2012 Time: 4098-1191 PT Time Calculation (min): 24 min  PT Assessment / Plan / Recommendation Comments on Treatment Session  pt able to participate well with multimodal cues for guidance.  Gait is moderately unsteady with pt stepping on her own feet with poor use ot the RW.    Follow Up Recommendations  Post acute inpatient;Supervision/Assistance - 24 hour     Does the patient have the potential to tolerate intense rehabilitation  Yes, Recommend IP Rehab Screening  Barriers to Discharge        Equipment Recommendations  Other (comment) (TBD in post acute)    Recommendations for Other Services    Frequency Min 4X/week   Plan Discharge plan remains appropriate;Frequency remains appropriate    Precautions / Restrictions Precautions Precautions: Fall Restrictions Weight Bearing Restrictions: No   Pertinent Vitals/Pain HR mid to upper 110's with exertion    Mobility  Bed Mobility Bed Mobility: Supine to Sit;Sitting - Scoot to Edge of Bed Supine to Sit: 3: Mod assist Sitting - Scoot to Edge of Bed: 4: Min assist Details for Bed Mobility Assistance: vc's for UE assist; truncal assist Transfers Transfers: Sit to Stand;Stand to Sit Sit to Stand: 4: Min assist;From bed Stand to Sit: 4: Min assist;To chair/3-in-1 Details for Transfer Assistance: tc/vc's for direction/ hand placement; steadying assist/control descent Ambulation/Gait Ambulation/Gait Assistance: 1: +2 Total assist Ambulation/Gait: Patient Percentage: 60% Ambulation Distance (Feet): 25 Feet Assistive device: Rolling walker Ambulation/Gait Assistance Details: moderately unsteady with narrowed BOS and frequent scissoring, bias toward standing at the right side of the RW. Gait Pattern: Step-through pattern;Decreased step length - right;Decreased step length - left;Decreased stride length;Scissoring;Trunk  flexed;Narrow base of support Stairs: No Modified Rankin (Stroke Patients Only) Modified Rankin: Moderately severe disability    Exercises     PT Diagnosis:    PT Problem List:   PT Treatment Interventions:     PT Goals Acute Rehab PT Goals PT Goal Formulation: Patient unable to participate in goal setting Time For Goal Achievement: 09/05/12 Potential to Achieve Goals: Fair PT Goal: Supine/Side to Sit - Progress: Progressing toward goal PT Goal: Sit to Supine/Side - Progress: Progressing toward goal PT Goal: Sit to Stand - Progress: Progressing toward goal PT Goal: Stand to Sit - Progress: Progressing toward goal PT Transfer Goal: Bed to Chair/Chair to Bed - Progress: Progressing toward goal PT Goal: Ambulate - Progress: Progressing toward goal  Visit Information  Last PT Received On: 08/25/12 Assistance Needed: +2 PT/OT Co-Evaluation/Treatment: Yes    Subjective Data  Subjective: I'll try not to.  (pull on the NG)   Cognition  Overall Cognitive Status: Impaired Area of Impairment: Attention;Following commands;Safety/judgement;Awareness of errors;Awareness of deficits;Problem solving Behavior During Session: Restless Current Attention Level: Other (comment);Sustained (very short span of time) Following Commands: Follows one step commands inconsistently Safety/Judgement: Decreased awareness of need for assistance;Decreased safety judgement for tasks assessed    Balance  Balance Balance Assessed: Yes Static Sitting Balance Static Sitting - Balance Support: No upper extremity supported;Feet supported Static Sitting - Level of Assistance: 4: Min assist Static Standing Balance Static Standing - Balance Support: Bilateral upper extremity supported;During functional activity Static Standing - Level of Assistance: 3: Mod assist Dynamic Standing Balance Dynamic Standing - Balance Support: Bilateral upper extremity supported;During functional activity Dynamic Standing - Level  of Assistance: 1: +2 Total assist;Patient percentage (comment) (pt=60%)  End of Session PT - End of Session Activity Tolerance:  Patient tolerated treatment well Patient left: in chair;with call bell/phone within reach;with family/visitor present Nurse Communication: Mobility status   GP     [name], Eliseo Gum 08/25/2012, 12:35 PM  08/25/2012  [place] Bing, PT [phone] (pager)

## 2012-08-25 NOTE — Clinical Social Work Psychosocial (Signed)
     Clinical Social Work Department BRIEF PSYCHOSOCIAL ASSESSMENT 08/25/2012  Patient:  [name],[name]     Account Number:  [id number]     Admit date:  08/18/2012  Clinical Social Worker:  Lourdes Sledge  Date/Time:  08/25/2012 10:37 AM  Referred by:  CSW  Date Referred:  08/25/2012 Referred for  SNF Placement   Other Referral:   Interview type:  Family Other interview type:   CSW completed assessment with pt husband [name] [name].    PSYCHOSOCIAL DATA Living Status:  HUSBAND Admitted from facility:   Level of care:   Primary support name:  [name] [name] [phone] Primary support relationship to patient:  SPOUSE Degree of support available:   Pt spouse states he is actively involved in pt care, stays at home & is able to care for pt.    CURRENT CONCERNS Current Concerns  Post-Acute Placement   Other Concerns:    SOCIAL WORK ASSESSMENT / PLAN CSW observed that PT has recommended CIR vs HH 24hr care.      CSW visited pt room to discuss SNF placement in the instance that CIR does not accept pt and pt spouse is unable to care for pt at home. CSW introduced herself and role. CSW explored pt living situation and ability for spouse to care for pt. CSW informed spouse of PT recommendations and whether pt spouse would be interested in SNF placement if CIR did not accept pt.    CSW has signed off as pt spouse is able to care for pt at home if CIR does not accept pt. No further CSW needs.   Assessment/plan status:  Psychosocial Support/Ongoing Assessment of Needs Other assessment/ plan:   Information/referral to community resources:   CSW provided pt spouse with a SNF list and with CSW contact information.    PATIENTS/FAMILYS RESPONSE TO PLAN OF CARE: Pt laying in bed asleep and currently confused. CSW completed assessment with spouse [name] [name] outside of pt room. Pt spouse pleasant to speak to and answered CSW questions appropriately. Pt spouse confirmed that he stays  home with pt and is able to provide 24hr care if CIR is unable to provide placement. Pt spouse stated he believed that CIR would be beneficial for pt however would be agreeable to [facility] services. Pt spouse stated he nor pt would be agreeable to SNF placement however appreciated CSW intervention and resources.    CSW has signed off.

## 2012-08-25 NOTE — Progress Notes (Signed)
Pt off unit in bed for MBS, husband at bedside

## 2012-08-25 NOTE — Progress Notes (Addendum)
Speech Language Pathology Treatment Patient Details Name: [name] [name] MRN: [id number] DOB: Apr 12, 1941 Today's Date: 08/25/2012 Time:  -    RN reports pt. Is more alert this am and NGT is bothering her.  SLP spoke with pt.husband.  She is somewhat more alert today.  SLP recommends removing NGT and repeating MBS for possible return to po's today.  RN entered orders.  MBS scheduled for today at 1330  [facility] [employer].Ed CCC-SLP Pager [phone]   08/25/2012, 11:31 AM

## 2012-08-25 NOTE — Procedures (Signed)
Objective Swallowing Evaluation: Modified Barium Swallowing Study  Patient Details  Name: [name] [name] MRN: [id number] Date of Birth: 1941/04/22  Today's Date: 08/25/2012 Time: 1350-1410 SLP Time Calculation (min): 20 min  Past Medical History:  Past Medical History  Diagnosis Date  . Diabetes mellitus   . GERD (gastroesophageal reflux disease)    Past Surgical History: History reviewed. No pertinent past surgical history. HPI:  71 y/o with h/x of GERD, DM. Admitted to [facility] after MRI showed subacute R. intraparenchymal bleed in parietal area. CXR showed mild left lower lobe airspace disease (could represent atelectisis, raises concern for infection or less likely aspiration). CT showed acute parenchymal hemorrhage in right occipital lobe.  MBS was completed 08/24/12 with recommendation of NPO.  RN contacted SLP reporting pt. is more alert and NGT is "bothering" pt.  SLP spoke with pt. who does appear more alert today.  SLP recommended repeat MBS with NGT removed prior to study.      Assessment / Plan / Recommendation Clinical Impression  Dysphagia Diagnosis: Mild oral phase dysphagia;Moderate oral phase dysphagia;Moderate pharyngeal phase dysphagia;Mild pharyngeal phase dysphagia Clinical impression: MBS repeated today due to increased alertness.  Mild-moderate improvements in oropharyngeal swallow function compared to yesterday's study.  Mild-moderate oral and pharyngeal phases of swallow are sensory and motor based.  She continues to exhibit reduced tongue base retraction and laryngeal elevation resulting in mod-severe vallecular and mild-moderate pyriform sinus residue with both nectar and honey consistencies.  Laryngeal penetration present during the swallow with nectar thick (mild, over epiglottis but not deep into laryngeal vestibule).  Pt. reflexively performs 3-4 multiple swallows decreasing residue to mild (sometimes moderate).  SLP recommends Dys1 diet and honey thick liquids via  teaspoon, multiple swallows, small bites, crushed pills.  Aspiration risk is moderately high.  ST will follow for safety, efficiency and continued pt/family education.    Treatment Recommendation  Therapy as outlined in treatment plan below    Diet Recommendation Dysphagia 1 (Puree);Honey-thick liquid   Liquid Administration via: No straw (LIQUID VIA TEASPOON) Medication Administration: Crushed with puree Supervision: Full supervision/cueing for compensatory strategies (feeding assist) Compensations: Slow rate;Small sips/bites;Check for pocketing;Multiple dry swallows after each bite/sip;Clear throat intermittently Postural Changes and/or Swallow Maneuvers: Seated upright 90 degrees    Other  Recommendations Oral Care Recommendations: Oral care BID   Follow Up Recommendations  Inpatient Rehab    Frequency and Duration min 2x/week  2 weeks       SLP Swallow Goals Patient will consume recommended diet without observed clinical signs of aspiration with: Minimal cueing Patient will utilize recommended strategies during swallow to increase swallowing safety with: Minimal cueing      Reason for Referral Objectively evaluate swallowing function   Oral Phase Oral Preparation/Oral Phase Oral Phase: Impaired Oral - Honey Oral - Honey Teaspoon: Weak lingual manipulation Oral - Honey Cup: Weak lingual manipulation Oral - Nectar Oral - Nectar Teaspoon: Weak lingual manipulation Oral - Nectar Cup: Weak lingual manipulation Oral - Thin Oral - Thin Teaspoon:  (decreased coordination)   Pharyngeal Phase Pharyngeal Phase Pharyngeal Phase: Impaired Pharyngeal - Honey Pharyngeal - Honey Teaspoon: Delayed swallow initiation;Pharyngeal residue - valleculae;Pharyngeal residue - pyriform sinuses;Reduced laryngeal elevation;Reduced tongue base retraction;Premature spillage to valleculae Pharyngeal - Honey Cup: Delayed swallow initiation;Premature spillage to valleculae;Reduced laryngeal  elevation;Reduced tongue base retraction;Pharyngeal residue - pyriform sinuses;Pharyngeal residue - valleculae Pharyngeal - Nectar Pharyngeal - Nectar Teaspoon: Delayed swallow initiation;Premature spillage to pyriform sinuses;Pharyngeal residue - valleculae;Pharyngeal residue - pyriform sinuses;Reduced tongue base retraction;Penetration/Aspiration during  swallow Penetration/Aspiration details (nectar teaspoon): Material enters airway, remains ABOVE vocal cords then ejected out Pharyngeal - Nectar Cup: Premature spillage to pyriform sinuses;Pharyngeal residue - pyriform sinuses;Pharyngeal residue - valleculae;Reduced tongue base retraction;Reduced laryngeal elevation;Delayed swallow initiation;Penetration/Aspiration during swallow Penetration/Aspiration details (nectar cup): Material enters airway, remains ABOVE vocal cords and not ejected out Pharyngeal - Thin Pharyngeal - Thin Teaspoon: Delayed swallow initiation;Premature spillage to pyriform sinuses;Pharyngeal residue - valleculae;Pharyngeal residue - pyriform sinuses;Reduced laryngeal elevation;Reduced tongue base retraction;Penetration/Aspiration during swallow Penetration/Aspiration details (thin teaspoon): Material enters airway, remains ABOVE vocal cords and not ejected out Pharyngeal - Solids Pharyngeal - Puree: Delayed swallow initiation;Premature spillage to valleculae;Reduced laryngeal elevation;Reduced tongue base retraction;Pharyngeal residue - pyriform sinuses;Pharyngeal residue - valleculae  Cervical Esophageal Phase    GO    Cervical Esophageal Phase Cervical Esophageal Phase: [facility]         Darrow Bussing.Ed [employer] [phone]  08/25/2012

## 2012-08-25 NOTE — Progress Notes (Addendum)
[facility] Progress Note [place] TEAM 1 - Stepdown/ICU TEAM  Assumption of Care Note  [name] [name] UJW:[id number] DOB: [birth year] DOA: 08/18/2012 PCP: Aida Puffer, MD  Brief narrative: 71 y/o female was admitted on 10/17 to [facility] with a subacute intracranial bleed noted on an outpatient MRI ordered for headache. Within 24 hours of admission she developed fever, seizure, and ongoing confusion. Resp failure lead to ET 10/18.  Events Since Admission:  10/15 MRI brain 1.3x2.2x1.4cm R posterior temporoparietal intraparenchymal hemorrhage consistent with an event occuring one week ago  10/17 CT head R posterior temporoparietal intraparenchymal hemorrhage 1.3 x 2.1 cm  10/17 CT head R posterior temporoparietal intraparenchymal hemorrhage without significant change  10/18- resp failure, intubation emergent, fever  10/18 EEG showed moderately severe, continuous, nonspecific, generalized slowing of cerebral activity. This pattern of slowing can be seen with number of different etiologies including degenerative and metabolic encephalopathies as well as postictal state following generalized seizure. No evidence of ongoing seizure activity was seen.  10/19 EEG shows mild, generalized, nonspecific, continuous slowing of cerebral activity with evidence of marked improvement compared to previous EEG study on August 19, 2012. No epileptiform discharges were recorded.  10/21 EEG showed moderately severe generalized slowing of cerebral activity as well as periodic lateralizing epileptiform discharges (PLED's). Lateralizing discharges on this type are most often seen following cerebral infarctions and are not typically associated with clinical seizure activity. This is a clear change from her EEG on November 13, 2011, which showed some mild generalized slowing.  Assessment/Plan:  Sub acute intraparenchymal hemorrhage (R posterior temporo-occipital) likely occurred ~ one week prior to admission  Neurology  has been following - the patient appears to be stabilizing clinically  Encephalopathy Due to above - improving rapidly - continue to follow - avoid sedating medications  Presumed aspiration PNA Clinically stable with no further indication for antibiotic therapy  Seizures w/ seizure d/o  Neurology is following - continue Keppra  hypokalemia Replacement via oral route and follow  Sinus tachy Etiology unclear - this could simply be beta blocker withdrawal in this patient who is on schedule beta blocker at home - given that this patient has been bedridden for an extended period we must consider the possibility of a pulmonary embolism - she of course would not be a candidate for anticoagulation - if her tachycardia does not improve with resumption of her usual beta blocker dose we will need to consider further diagnostic evaluation to rule out pulmonary embolism  Chronic kidney disease Kidney function appears to be stabilizing and 1.0-1.25 range - please note however that this is indicative of a mildly suppressed GFR at 40-55  DM2 CBGs are climbing - I have adjusted her treatment regimen  Dysphagia The patient's nasogastric tube has been discontinued and speech has now cleared her for slow advancement of her diet to dysphagia 1 with honey thick liquids   Code Status: FULL Disposition Plan: The patient is being evaluated for possible inpatient rehabilitation  Consultants: Neurology Physical medicine and rehabilitation  Antibiotics: Amp 10/18 >> 10/19  Vanc 10/18 >> 10/19  Acyclovir 10/18 >> 10/19  Ceftriaxone 10/18 >> 10/19  DVT prophylaxis: SCDs  HPI/Subjective: The patient is alert and interactive at the time of my exam.  She denies headache fevers chills chest pain nausea vomiting or abdominal pain.     Objective: Blood pressure 134/74, pulse 118, temperature 98.3 F (36.8 C), temperature source Oral, resp. rate 23, height 5\' 5"  (1.651 m), weight 54 kg (119 lb 0.8 oz),  SpO2 100.00%.  Intake/Output Summary (Last 24 hours) at 08/25/12 1444 Last data filed at 08/25/12 1300  Gross per 24 hour  Intake   1467 ml  Output   1925 ml  Net   -458 ml     Exam: General: No acute respiratory distress Lungs: Clear to auscultation bilaterally without wheezes or crackles Cardiovascular: Regular rate and rhythm without murmur gallop or rub normal S1 and S2 Abdomen: Nontender, nondistended, soft, bowel sounds positive, no rebound, no ascites, no appreciable mass Extremities: No significant cyanosis, clubbing, or edema bilateral lower extremities  Data Reviewed: Basic Metabolic Panel:  Lab 08/25/12 1610 08/24/12 0505 08/23/12 0235 08/22/12 0417 08/21/12 0602  NA 148* 140 139 142 137  K 3.0* 3.9 3.2* 3.9 2.7*  CL 109 103 104 107 103  CO2 26 21 25 22 25   GLUCOSE 329* 287* 109* 200* 135*  BUN 16 13 7 7 10   CREATININE 1.24* 1.16* 1.13* 1.06 1.03  CALCIUM 8.2* 7.7* 7.7* 7.7* 6.8*  MG 1.5 0.7* -- -- --  PHOS 2.1* 2.4 -- -- --   Liver Function Tests:  Lab 08/21/12 0602 08/20/12 0430  AST 52* 51*  ALT 26 29  ALKPHOS 66 85  BILITOT 0.3 0.5  PROT 5.1* 6.5  ALBUMIN 2.3* 3.2*    Lab 08/20/12 0021  AMMONIA 36   CBC:  Lab 08/25/12 0517 08/24/12 0505 08/23/12 0235 08/22/12 0417 08/21/12 0602 08/18/12 1818  WBC 12.4* 16.8* 18.8* 15.6* 11.2* --  NEUTROABS -- -- -- -- 8.5* 6.5  HGB 10.8* 11.5* 11.0* 10.4* 9.6* --  HCT 33.2* 34.6* 32.6* 31.9* 29.6* --  MCV 95.1 93.8 93.7 94.9 95.2 --  PLT 194 184 178 177 173 --   Cardiac Enzymes:  Lab 08/20/12 0430  CKTOTAL --  CKMB --  CKMBINDEX --  TROPONINI <0.30   CBG:  Lab 08/25/12 1123 08/25/12 0821 08/25/12 0413 08/24/12 2345 08/24/12 1953  GLUCAP 210* 260* 292* 199* 177*    Recent Results (from the past 240 hour(s))  URINE CULTURE     Status: Normal   Collection Time   08/16/12  4:14 PM      Component Value Range Status Comment   Colony Count NO GROWTH   Final    Organism ID, Bacteria NO GROWTH   Final    MRSA PCR SCREENING     Status: Normal   Collection Time   08/19/12  1:33 AM      Component Value Range Status Comment   MRSA by PCR NEGATIVE  NEGATIVE Final   CULTURE, BLOOD (ROUTINE X 2)     Status: Normal (Preliminary result)   Collection Time   08/19/12 10:02 PM      Component Value Range Status Comment   Specimen Description BLOOD RIGHT HAND   Final    Special Requests BOTTLES DRAWN AEROBIC ONLY 1CC   Final    Culture  Setup Time 08/20/2012 03:49   Final    Culture     Final    Value:        BLOOD CULTURE RECEIVED NO GROWTH TO DATE CULTURE WILL BE HELD FOR 5 DAYS BEFORE ISSUING A FINAL NEGATIVE REPORT   Report Status PENDING   Incomplete   CULTURE, BLOOD (ROUTINE X 2)     Status: Normal (Preliminary result)   Collection Time   08/19/12 10:04 PM      Component Value Range Status Comment   Specimen Description BLOOD RIGHT FOREARM   Final  Special Requests BOTTLES DRAWN AEROBIC ONLY 1CC   Final    Culture  Setup Time 08/20/2012 03:49   Final    Culture     Final    Value:        BLOOD CULTURE RECEIVED NO GROWTH TO DATE CULTURE WILL BE HELD FOR 5 DAYS BEFORE ISSUING A FINAL NEGATIVE REPORT   Report Status PENDING   Incomplete   URINE CULTURE     Status: Normal   Collection Time   08/20/12 12:13 AM      Component Value Range Status Comment   Specimen Description URINE, CATHETERIZED   Final    Special Requests Normal   Final    Culture  Setup Time 08/20/2012 01:19   Final    Colony Count NO GROWTH   Final    Culture NO GROWTH   Final    Report Status 08/21/2012 FINAL   Final   URINE CULTURE     Status: Normal   Collection Time   08/21/12  2:21 AM      Component Value Range Status Comment   Specimen Description URINE, CATHETERIZED   Final    Special Requests NONE   Final    Culture  Setup Time 08/21/2012 13:41   Final    Colony Count NO GROWTH   Final    Culture NO GROWTH   Final    Report Status 08/22/2012 FINAL   Final   CULTURE, BLOOD (ROUTINE X 2)     Status: Normal  (Preliminary result)   Collection Time   08/21/12  2:45 AM      Component Value Range Status Comment   Specimen Description BLOOD LEFT ARM   Final    Special Requests BOTTLES DRAWN AEROBIC ONLY 7CC   Final    Culture  Setup Time 08/21/2012 12:25   Final    Culture     Final    Value:        BLOOD CULTURE RECEIVED NO GROWTH TO DATE CULTURE WILL BE HELD FOR 5 DAYS BEFORE ISSUING A FINAL NEGATIVE REPORT   Report Status PENDING   Incomplete   CULTURE, BLOOD (ROUTINE X 2)     Status: Normal (Preliminary result)   Collection Time   08/21/12  2:50 AM      Component Value Range Status Comment   Specimen Description BLOOD RIGHT HAND   Final    Special Requests BOTTLES DRAWN AEROBIC ONLY 10CC   Final    Culture  Setup Time 08/21/2012 12:25   Final    Culture     Final    Value:        BLOOD CULTURE RECEIVED NO GROWTH TO DATE CULTURE WILL BE HELD FOR 5 DAYS BEFORE ISSUING A FINAL NEGATIVE REPORT   Report Status PENDING   Incomplete      Studies:  Recent x-ray studies have been reviewed in detail by the Attending Physician  Scheduled Meds:  Reviewed in detail by the Attending Physician   Lonia Blood, MD [facility] Office  [phone] Pager [phone]  On-Call/Text Page:      [email or web address]      password [email or web address]  If 7PM-7AM, please contact night-coverage [email or web address] Password [email or web address] 08/25/2012, 2:44 PM   LOS: 7 days

## 2012-08-25 NOTE — Progress Notes (Signed)
Occupational Therapy Treatment Patient Details Name: [name] [name] MRN: [id number] DOB: [birth year] Today's Date: 08/25/2012 Time: 4098-1191 OT Time Calculation (min): 23 min  OT Assessment / Plan / Recommendation Comments on Treatment Session Pt progressing very well. Able to ambulate out to hall today.     Follow Up Recommendations  Inpatient Rehab    Barriers to Discharge       Equipment Recommendations   (TBD post acutely)    Recommendations for Other Services Rehab consult  Frequency     Plan Discharge plan remains appropriate    Precautions / Restrictions Precautions Precautions: Fall Restrictions Weight Bearing Restrictions: No   Pertinent Vitals/Pain Pt indicates LE pain to touch but unable to discern specific location or give rating. Pt repositioned for pain relief.    ADL  Grooming: Performed;Minimal assistance;Wash/dry face Where Assessed - Grooming: Supported sitting Toilet Transfer: Mining engineer Method: Sit to Barista:  (from bed) Toileting - Clothing Manipulation and Hygiene: Simulated;Moderate assistance Where Assessed - Toileting Clothing Manipulation and Hygiene: Standing Equipment Used: Gait belt;Rolling walker Transfers/Ambulation Related to ADLs: Min A with sit to stand from bed; pt anxious to get OOB and ambulate. Able to walk ~58ft before needing to sit secondary to fatigue ADL Comments: Pt continues with visual deficits, but is now able to move eye outside of midline- less so on command. Pt reports double vision in left eye with right eye closed and questionable field deficit in right eye, however, pt report varies and she was unable to attend long enough for thorough examination. Will continue to assess and tx as appropriate    OT Diagnosis:    OT Problem List:   OT Treatment Interventions:     OT Goals ADL Goals Pt Will Perform Grooming: with supervision;Sitting at sink;Standing at sink ADL  Goal: Grooming - Progress: Goal set today Pt Will Transfer to Toilet: with min assist;Ambulation;with DME ADL Goal: Toilet Transfer - Progress: Goal set today Miscellaneous OT Goals OT Goal: Miscellaneous Goal #1 - Progress: Progressing toward goals OT Goal: Miscellaneous Goal #2 - Progress: Progressing toward goals  Visit Information  Last OT Received On: 08/25/12 Assistance Needed: +2    Subjective Data      Prior Functioning       Cognition  Overall Cognitive Status: Impaired Area of Impairment: Attention;Following commands;Safety/judgement;Awareness of errors;Awareness of deficits;Problem solving Behavior During Session: Restless Current Attention Level: Sustained (~20sec) Following Commands: Follows one step commands inconsistently Safety/Judgement: Decreased awareness of need for assistance;Decreased safety judgement for tasks assessed Awareness of Errors: Assistance required to identify errors made;Assistance required to correct errors made Cognition - Other Comments: pt attempting to stand without assist     Mobility  Shoulder Instructions Bed Mobility Bed Mobility: Supine to Sit;Sitting - Scoot to Edge of Bed Supine to Sit: 3: Mod assist Sitting - Scoot to Edge of Bed: 4: Min assist Details for Bed Mobility Assistance: vc's for UE assist; truncal assist Transfers Sit to Stand: 4: Min assist;From bed Stand to Sit: 4: Min assist;To chair/3-in-1 Details for Transfer Assistance: tc/vc's for direction/ hand placement; steadying assist/control descent       Exercises      Balance Balance Balance Assessed: Yes Static Sitting Balance Static Sitting - Balance Support: No upper extremity supported;Feet supported Static Sitting - Level of Assistance: 4: Min assist Static Standing Balance Static Standing - Balance Support: Bilateral upper extremity supported;During functional activity Static Standing - Level of Assistance: 3: Mod assist Dynamic Standing  Balance  Dynamic Standing - Balance Support: Bilateral upper extremity supported;During functional activity Dynamic Standing - Level of Assistance: 1: +2 Total assist;Patient percentage (comment) (pt=60%)   End of Session OT - End of Session Equipment Utilized During Treatment: Gait belt Activity Tolerance: Patient limited by fatigue Patient left: in chair (restraints left off per RN request) Nurse Communication: Mobility status  GO     Ryun Velez 08/25/2012, 1:44 PM

## 2012-08-25 NOTE — Progress Notes (Signed)
Inpatient Diabetes Program Recommendations  AACE/ADA: New Consensus Statement on Inpatient Glycemic Control (2013)  Target Ranges:  Prepandial:   less than 140 mg/dL      Peak postprandial:   less than 180 mg/dL (1-2 hours)      Critically ill patients:  140 - 180 mg/dL   Reason for Visit: CBGs 08-24-12  [phone] mg/dl        96-04-54  [phone] mg/dl  Inpatient Diabetes Program Recommendations Insulin - Basal: . Start Levemir 15 units daily.  Takes this dosage at home.  Note:

## 2012-08-25 NOTE — Progress Notes (Signed)
Pt returned from [facility] without NGT, and Lt hand IV, order to leave NGT out and advance diet, new IV started

## 2012-08-26 LAB — GLUCOSE, CAPILLARY
Glucose-Capillary: 306 mg/dL — ABNORMAL HIGH (ref 70–99)
Glucose-Capillary: 91 mg/dL (ref 70–99)

## 2012-08-26 LAB — CULTURE, BLOOD (ROUTINE X 2): Culture: NO GROWTH

## 2012-08-26 LAB — BASIC METABOLIC PANEL
GFR calc non Af Amer: 47 mL/min — ABNORMAL LOW (ref >90)
Glucose, Bld: 248 mg/dL — ABNORMAL HIGH (ref 70–99)
Potassium: 3.2 mEq/L — ABNORMAL LOW (ref 3.5–5.1)
Sodium: 146 mEq/L — ABNORMAL HIGH (ref 135–145)

## 2012-08-26 MED ORDER — DEXTROSE-NACL 5-0.45 % IV SOLN: INTRAVENOUS | Status: DC

## 2012-08-26 MED ORDER — VITAMIN C 500 MG PO TABS
500.0000 mg | ORAL_TABLET | Freq: Every day | ORAL | Status: DC
  Administered 2012-08-26 – 2012-08-27 (×2): 500 mg via ORAL
  Filled 2012-08-26 (×2): qty 1

## 2012-08-26 MED ORDER — INSULIN DETEMIR 100 UNIT/ML ~~LOC~~ SOLN: 15.0000 [IU] | Freq: Every day | SUBCUTANEOUS | Status: DC

## 2012-08-26 MED ORDER — INSULIN DETEMIR 100 UNIT/ML ~~LOC~~ SOLN: 18.0000 [IU] | Freq: Every day | SUBCUTANEOUS | Status: DC

## 2012-08-26 MED ORDER — ADULT MULTIVITAMIN W/MINERALS CH
1.0000 | ORAL_TABLET | Freq: Every day | ORAL | Status: DC
  Administered 2012-08-26 – 2012-08-27 (×2): 1 via ORAL
  Filled 2012-08-26 (×2): qty 1

## 2012-08-26 MED ORDER — METOPROLOL TARTRATE 25 MG PO TABS
25.0000 mg | ORAL_TABLET | Freq: Two times a day (BID) | ORAL | Status: DC
  Administered 2012-08-26: 25 mg via ORAL
  Filled 2012-08-26 (×4): qty 1

## 2012-08-26 NOTE — Progress Notes (Signed)
Physical Therapy Treatment Patient Details Name: [name] [name] MRN: [id number] DOB: October 14, 1941 Today's Date: 08/26/2012 Time: 4098-1191 PT Time Calculation (min): 38 min  PT Assessment / Plan / Recommendation Comments on Treatment Session  pt participative, but needing frequent redirection.  Still with very narrow gait pattern to the point of tandem walking    Follow Up Recommendations  Post acute inpatient;Supervision/Assistance - 24 hour     Does the patient have the potential to tolerate intense rehabilitation  Yes, Recommend IP Rehab Screening  Barriers to Discharge        Equipment Recommendations  Other (comment) (TBD)    Recommendations for Other Services Rehab consult  Frequency Min 4X/week   Plan Discharge plan remains appropriate;Frequency remains appropriate    Precautions / Restrictions Precautions Precautions: Fall   Pertinent Vitals/Pain BP prior to treatment 150's /60's, each successive standing/gait trial cause more dizziness and drop in BP 99/64, 92/53, 88/50, then pt reclined in chair with final rebounding BP at 107/65    Mobility  Bed Mobility Bed Mobility: Rolling Right;Supine to Sit;Sitting - Scoot to [place] of Bed Rolling Right: 3: Mod assist Right Sidelying to Sit: 3: Mod assist Sitting - Scoot to Edge of Bed: 4: Min assist Details for Bed Mobility Assistance: vc's for hand placement and safe technique; extra time taken to let pt execute; truncal assist Transfers Transfers: Sit to Stand;Stand to Sit Sit to Stand: From bed;From chair/3-in-1 (times 4) Stand to Sit: 4: Min assist;To bed;To chair/3-in-1;With upper extremity assist Details for Transfer Assistance: tc/vc's for direction/ hand placement; steadying assist/control descent Ambulation/Gait Ambulation/Gait Assistance: 1: +2 Total assist Ambulation/Gait: Patient Percentage: 60% Ambulation Distance (Feet): 15 Feet (then 20, then 24 with rests and BP taken each sit) Assistive device: Rolling  walker Ambulation/Gait Assistance Details: gait characterized by scissored, tandem walking with less stepping on her toes today. Frequent need to correct posture and redirect her to task Gait Pattern: Step-through pattern;Decreased step length - right;Decreased step length - left;Decreased stride length;Scissoring;Trunk flexed;Narrow base of support Stairs: No Modified Rankin (Stroke Patients Only) Modified Rankin: Moderately severe disability    Exercises     PT Diagnosis:    PT Problem List:   PT Treatment Interventions:     PT Goals Acute Rehab PT Goals Potential to Achieve Goals: Fair PT Goal: Supine/Side to Sit - Progress: Progressing toward goal PT Goal: Sit to Stand - Progress: Progressing toward goal PT Goal: Stand to Sit - Progress: Progressing toward goal PT Transfer Goal: Bed to Chair/Chair to Bed - Progress: Progressing toward goal PT Goal: Ambulate - Progress: Progressing toward goal  Visit Information  Last PT Received On: 08/26/12 Assistance Needed: +2    Subjective Data  Subjective: I'm awake, I'm ready   Cognition  Overall Cognitive Status: Impaired Area of Impairment: Attention;Following commands;Safety/judgement;Awareness of errors;Awareness of deficits;Problem solving Arousal/Alertness: Awake/alert Behavior During Session: [facility] for tasks performed Current Attention Level: Sustained Following Commands: Follows one step commands inconsistently    Balance  Balance Balance Assessed: Yes Static Sitting Balance Static Sitting - Balance Support: No upper extremity supported;Feet supported Static Sitting - Level of Assistance: 5: Stand by assistance Static Standing Balance Static Standing - Balance Support: Bilateral upper extremity supported;During functional activity Static Standing - Level of Assistance: 3: Mod assist  End of Session PT - End of Session Activity Tolerance: Patient tolerated treatment well;Other (comment) (limited by orthostatics) Patient  left: in chair;with call bell/phone within reach;with family/visitor present Nurse Communication: Mobility status   GP  [name] [name], [name] [name] 08/26/2012, 12:20 PM  08/26/2012  Speers Bing, PT [phone] (pager)

## 2012-08-26 NOTE — Progress Notes (Signed)
Stroke Team Progress Note  HISTORY [name] [name] is an 71 y.o. Female complains of headache and nausea and vomiting in early Oct, 2013. later her headache got worse and she started becoming confused.  She runs into things on the left, stumbling, and just not seeming like herself.   She was told to take aspirin every 6 hours along with Tylenol and has been doing so for the past 2 days. She was not taking regular aspirin prior to her onset of symptoms.   CT head showed  acute parenchymal  hemorrhage in the right occipital lobe just below the occipital  horn of the right lateral ventricle.   She had seizure in 10/18,EEG twice, 10/18, moderately severe, continuoues generalized slowing.  Keppra was given at 10/18, 3am.  Repeat 10/19: much improved, mild generalized slowing.   SUBJECTIVE husband at bedside. Patient more alert, talkative.  OBJECTIVE Most recent Vital Signs: Filed Vitals:   08/25/12 2144 08/26/12 0000 08/26/12 0431 08/26/12 0721  BP: 133/61 134/61 146/85   Pulse: 117 111 111   Temp:  98.7 F (37.1 C) 99.1 F (37.3 C) 97.9 F (36.6 C)  TempSrc:  Oral Oral Oral  Resp:  24 22   Height:      Weight:   53.8 kg (118 lb 9.7 oz)   SpO2:  96% 99%    CBG (last 3)   Basename 08/25/12 2020 08/25/12 1635 08/25/12 1123  GLUCAP 117* 252* 210*   IV Fluid Intake:      . sodium chloride 50 mL/hr at 08/25/12 2300  . DISCONTD: dextrose 5 % and 0.9% NaCl 100 mL/hr at 08/25/12 1739  . DISCONTD: feeding supplement (JEVITY 1.2 CAL) 1,000 mL (08/24/12 2229)   MEDICATIONS     . antiseptic oral rinse  15 mL Mouth Rinse QID  . chlorhexidine  15 mL Mouth Rinse BID  . insulin aspart  0-15 Units Subcutaneous TID WC  . insulin detemir  15 Units Subcutaneous Q breakfast  . levetiracetam  250 mg Intravenous BID  . metoprolol tartrate  12.5 mg Oral BID  . pantoprazole sodium  40 mg Oral Daily  . potassium chloride  40 mEq Oral BID  . DISCONTD: famotidine (PEPCID) IV  20 mg Intravenous Q12H    . DISCONTD: insulin aspart  0-15 Units Subcutaneous Q4H   PRN:  acetaminophen, acetaminophen, food thickener, labetalol, ondansetron (ZOFRAN) IV, DISCONTD: metoprolol  Diet:  Dysphagia 1 honey thick Activity:   Up with assistance DVT Prophylaxis:  SCDs   CLINICALLY SIGNIFICANT STUDIES Basic Metabolic Panel:   Lab 08/26/12 0450 08/25/12 0517 08/24/12 0505  NA 146* 148* --  K 3.2* 3.0* --  CL 107 109 --  CO2 27 26 --  GLUCOSE 248* 329* --  BUN 14 16 --  CREATININE 1.14* 1.24* --  CALCIUM 8.2* 8.2* --  MG -- 1.5 0.7*  PHOS -- 2.1* 2.4   Liver Function Tests:   Lab 08/21/12 0602 08/20/12 0430  AST 52* 51*  ALT 26 29  ALKPHOS 66 85  BILITOT 0.3 0.5  PROT 5.1* 6.5  ALBUMIN 2.3* 3.2*   CBC:   Lab 08/25/12 0517 08/24/12 0505 08/21/12 0602  WBC 12.4* 16.8* --  NEUTROABS -- -- 8.5*  HGB 10.8* 11.5* --  HCT 33.2* 34.6* --  MCV 95.1 93.8 --  PLT 194 184 --   Coagulation:  No results found for this basename: LABPROT:4,INR:4 in the last 168 hours Cardiac Enzymes:   Lab 08/20/12 0430  CKTOTAL --  CKMB --  CKMBINDEX --  TROPONINI <0.30   Urinalysis:   Lab 08/19/12 2150  COLORURINE YELLOW  LABSPEC 1.011  PHURINE 6.5  GLUCOSEU NEGATIVE  HGBUR TRACE*  BILIRUBINUR NEGATIVE  KETONESUR 15*  PROTEINUR NEGATIVE  UROBILINOGEN 1.0  NITRITE NEGATIVE  LEUKOCYTESUR NEGATIVE   HgbA1C  Lab Results  Component Value Date   HGBA1C 7.4* 08/19/2012   CT of the brain   08/22/2012 Stable appearance of the brain since the prior study. No appreciable change in the intra-axial hemorrhage. 08/20/2012  1.  Stable appearance of the brain, with acute parenchymal hemorrhage in the right occipital lobe just below the occipital horn of the right lateral ventricle.      08/19/2012  Right posterior temporoparietal intraparenchymal hemorrhage without significant change.  Underlying cerebral atrophy and small vessel ischemic changes.    08/19/2012  Stable right posterior temporo-occipital  hemorrhage.   MRI of the brain  08/18/2012 13 x 22 x 14 mm right posterior temporo-occipital intra-axial acute and subacute hemorrhage consistent with an event occurring 1 week ago.  The appearance is nonspecific and could represent hemorrhage is secondary to hypertension, anticoagulation, occult trauma, venous angioma or cavernoma (OCVM), pial AVM, hemorrhagic metastasis, or idiopathic.  Continued surveillance is warranted.   2D echo 06/24/2012   A sinus tachycardia rhythm was noted on this study, making this study technically difficult for the assessment of cardiac function. No cardiac source of embolism was identified, but cannot be ruled out on the basis of this examination. Normal pulmonary artery pressure.   CXR 08/24/2012 1. Improved aeration at both lung bases with some residual  atelectasis bilaterally, slightly worse on the left. 2. Status post removal of right IJ line without pneumothorax. 3. New NG tube. 08/23/2012 1. Status post extubation. 2. Mild left lower lobe airspace disease. While this could represent atelectasis, and raises concern for infection or less  likely aspiration. 3. Mild pulmonary vascular congestion. 08/22/2012 1. Status post extubation. 2. Mild left lower lobe airspace disease. While this could represent atelectasis, and raises concern for infection or less likely aspiration.  3. Mild pulmonary vascular congestion 08/20/2012  Endotracheal tube tip projects 2.6 cm proximal to the carina. Otherwise, no interval change.   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  No evidence of active pulmonary disease.    08/18/2012 No acute cardiopulmonary process.    EKG  normal sinus rhythm, PAC's noted.   EEG 08/24/2012 moderately severe generalized slowing  of cerebral activity as well as periodic lateralizing epileptiform  discharges (PLED's). Lateralizing discharges of this type are most often seen following cerebral infarctions and are not typically associated with  clinical seizure activity. This is a clear change from her last EEG on November 13, 2011, which showed only mild generalized slowing  Therapy Recommendations PT - CIR ; OT - CIR ST -   Physical Exam : Cardiac exam no murmur or gallop tachycardia. Lungs are clear to auscultation. Distal pulses are well felt.  Neurological Exam: Patient is, lying in bed.awake and interactive oriented to person and place but not to time. Follows simple one and  few.2 step commands. Diminished attention, short-term memory and recall Pupils are 4 mm equal reactive. Doll's eye movements are present.she does not blink to threat on the left but does so on the right. Corneal reflexes are present.she is awake today and interactive. She speak short sentences and few words. She follows commands quite well today. She has bilateral arm and leg spontaneous movement without  any focal deficits the. Deep tendon flexes are preserved. Both plantars showed withdrawal response.  ASSESSMENT [name] is a 71 y.o. female with acute right posterior temporo-occipital intra-axial hemorrhage. Hemorrhage most likely secondary to hypertension and frequent ingestion of aspirin (new antihypertensives started within the past 1 mo with recent q6h aspirin), On aspirin q 6 hr for headache prior to admission.    New focal seizures 10/17 , EEG showed marked improvement after starting keppra  Respiratory status deteriorated 08/20/2012 am, requiring intubation in setting of elevated temp. Respiratory status deterioration likely due to CNS sedation due to antiepileptics and/or possible aspiration. Extubated, though remains somewhat  Encephalopathic, though improving.   Presumed aspiration pneumonia, started on abx  Leukocytosis, wbc 12.4  Headache secondary to hemorrhage  New onset seizures, on keppra. Given sensitivity to keppra and antiepileptics, do not want to change current regimen.  Diabetes, HgbA1c 7.4  GERD  Left hip pain, recent fall  per husband. Hip xray cancelled   Creatinine 1.34 Hypertension, new medications added within past 1 mo Dysphagia secondary to stroke. Passed swallow yest.  Hospital day # 8  TREATMENT/PLAN  Continue keppra at 250mg  bid. Ok to transfer out of the ICU from neuro standpoint. Ok for rehab transfer Ongoing SBP goal < 180 If has clinical seizures, would not increase keppra, but add vimpat Stroke Service will sign off. Follow up with Dr. Pearlean Brownie, Stroke Clinic, in 2 months.  Annie Main, MSN, RN, ANVP-BC, ANP-BC, GNP-BC [facility] Pager: [phone] 08/26/2012 8:16 AM  Scribe for Dr. Delia Heady, Stroke Center Medical Director, who has personally reviewed chart, pertinent data, examined the patient and developed the plan of care. Pager:  [phone]

## 2012-08-26 NOTE — Progress Notes (Addendum)
Met with patient's husband at bedside to discuss CIR. Pt lethargic, unable to focus on conversation. Patient would benefit from inpatient rehab prior to d/c to home. Pt's husband states that he and his daughter could provide assistance as needed after patient is discharged. He is in agreement with plan for pt to come to CIR. Sent message to Dr Butler Denmark requesting notification of when pt is medically ready for d/c to CIR.  Melanee Spry, RN, Admission Coordinator to follow-up in AM. For questions please call [phone].

## 2012-08-26 NOTE — Progress Notes (Signed)
[facility] Progress Note [place] TEAM 1 - Stepdown/ICU TEAM   Bhumi Godbey NWG:[id number] DOB: Apr 26, 1941 DOA: 08/18/2012 PCP: Aida Puffer, MD  Brief narrative: *71 y/o female was admitted on 10/17 to [facility] with a subacute intracranial bleed noted on an outpatient MRI ordered for headache. Within 24 hours of admission she developed fever, seizure, and ongoing confusion. Resp failure lead to ET 10/18, Extubation 10/19.  Events Since Admission:  10/15 MRI brain 1.3x2.2x1.4cm R posterior temporoparietal intraparenchymal hemorrhage consistent with an event occuring one week ago  10/17 CT head R posterior temporoparietal intraparenchymal hemorrhage 1.3 x 2.1 cm  10/17 CT head R posterior temporoparietal intraparenchymal hemorrhage without significant change  10/18- resp failure, intubation emergent, fever  10/18 EEG showed moderately severe, continuous, nonspecific, generalized slowing of cerebral activity. This pattern of slowing can be seen with number of different etiologies including degenerative and metabolic encephalopathies as well as postictal state following generalized seizure. No evidence of ongoing seizure activity was seen.  10/19 EEG shows mild, generalized, nonspecific, continuous slowing of cerebral activity with evidence of marked improvement compared to previous EEG study on August 19, 2012. No epileptiform discharges were recorded.  10/21 EEG showed moderately severe generalized slowing of cerebral activity as well as periodic lateralizing epileptiform discharges (PLED's). Lateralizing discharges on this type are most often seen following cerebral infarctions and are not typically associated with clinical seizure activity. This is a clear change from her EEG on November 13, 2011, which showed some mild generalized slowing. 10/23 CIR evaluation done, pt is appropriate for continued rehab at [facility], and has agreed to participate.   Assessment/Plan: Principal Problem:  *Hemorrhage in the brain-13 x 22 x 14 mm right posterior temporo-occipital intra-axial acute. * Neurology stroke service has signed off.   *Pt.To follow up with Dr. Pearlean Brownie at Stroke Clinic in 2 months. * Plan to continue Keppra  250 mg per neurology suggestion. * If clinical seizures recur, do not increase Keppra, but add Vimpat per neurology. * Ongoing SBP goal < 180   Active Problems:  * Encephalopathy acute:  * Secondary to Hemorrhage in the brain. * Pt continues to improve. * Answering questions in short phrases today, following simple commands. * Continue to avoid sedating drugs. * Continue to follow for resolution.  * Presumed Aspiration Pneumonia: * Appears to be resolved * Continues to SaO2 100% on room air. * No further antibiotic therapy.  * Seizures With Seizure Disorder: * Continue Keppra 250 mg per Neuro advice. * No further seizure activity noted. * If seizures recur, start Vimpat per neuro.  * Hypokalemia: * Potassium 3.2 today. * K was 3.0 yesterday. * Continue with supplemental Potassium 40 mEq BID today. * Repeat BMET in am.  *Sinus Tachycardia: * Pt. Remains tachycardic despite restarting home beta blocker regiment. * Dose of lopressor increased to 25 mg. BID. * If tachycardia does not resolve with this change, may need to work-up for Pulmonary Embolism due to extended immobility/risk factors .  * Chronic Kidney Disease Stage 3 GFR 30-59 ml/min: * BUN and Creatinine continue to trend down. * Values today 14 and 1.14 * GFR today is 47. * Continue to follow.  * Diabetes Mellitus II: * CBG's remain high. * Have modified patient's diet to a Carb Modified/Moderate to account for patient's    diabetic history. * If they remain high after diet change, will consider increasing Levemir coverage.  * Dysphagia: * Continue honey thick liquids and Dysphagia I Diet. * Encourage po thickened liquids. *  Will re-evaluate as patients strength improves with follow-up  swallow evaluation at later date.  * Hypertension: * Systolic B/P's are trending up into 150 range.  * Will continue to monitor with SBP goal of <180 per neuro recommendations.  * Leukocytosis: * Continues to trend down; 12.4  10/24. * Continue to monitor-CBC in am.  * Left Hemiparesis: * Pt. Has bilateral arm and leg spontaneous movement without any focal deficits. * Good strong grips bilaterally, slightly stronger on the right. * Left eye does not blink to threat, right eye does blink to threat. * For placement in CIR as soon as medically cleared for extensive rehab.  *Nausea: * Pt. Denied nausea today when asked.  * Headache: * Pt. Denied headache today when asked.   DVT prophylaxis: * SCD's Code Status: * Full Family Communication: * Spoke directly with patient and her husband at bedside.  Disposition Plan: * For transfer to telemetry bed.   Consultants: * Neurology ( signed off today 10/24) * Physical Medicine and Rehab   Procedures: * See events this admission   Antibiotics: *Amp 10/18 >> 10/19  Vanc 10/18 >> 10/19  Acyclovir 10/18 >> 10/19  Ceftriaxone 10/18 >> 10/19     HPI/Subjective: * Pt is easily arousable, and answers questions in short phrases. She denies headache, nausea or pain of any kind.    Objective: Blood pressure 143/70, pulse 114, temperature 99.5 F (37.5 C), temperature source Oral, resp. rate 22, height 5\' 5"  (1.651 m), weight 53.8 kg (118 lb 9.7 oz), SpO2 100.00%.  Intake/Output Summary (Last 24 hours) at 08/26/12 1314 Last data filed at 08/26/12 1200  Gross per 24 hour  Intake   1130 ml  Output   2175 ml  Net  -1045 ml     Exam: General: No acute respiratory distress Lungs: Clear to auscultation bilaterally without wheezes or crackles. SaO2 100% on room air. Cardiovascular: Regular rate and rhythm without murmur gallop or rub normal S1 and S2 Abdomen: Nontender, nondistended, soft, bowel sounds positive, no rebound, no  ascites, no appreciable mass Extremities: No significant cyanosis, clubbing, or edema bilateral lower extremities  Data Reviewed: Basic Metabolic Panel, CBC:  Lab 08/26/12 0450 08/25/12 0517 08/24/12 0505 08/23/12 0235 08/22/12 0417  NA 146* 148* 140 139 142  K 3.2* 3.0* 3.9 3.2* 3.9  CL 107 109 103 104 107  CO2 27 26 21 25 22   GLUCOSE 248* 329* 287* 109* 200*  BUN 14 16 13 7 7   CREATININE 1.14* 1.24* 1.16* 1.13* 1.06  CALCIUM 8.2* 8.2* 7.7* 7.7* 7.7*  MG -- 1.5 0.7* -- --  PHOS -- 2.1* 2.4 -- --   Liver Function Tests:  Lab 08/21/12 0602 08/20/12 0430  AST 52* 51*  ALT 26 29  ALKPHOS 66 85  BILITOT 0.3 0.5  PROT 5.1* 6.5  ALBUMIN 2.3* 3.2*   No results found for this basename: LIPASE:5,AMYLASE:5 in the last 168 hours  Lab 08/20/12 0021  AMMONIA 36   CBC:  Lab 08/25/12 0517 08/24/12 0505 08/23/12 0235 08/22/12 0417 08/21/12 0602  WBC 12.4* 16.8* 18.8* 15.6* 11.2*  NEUTROABS -- -- -- -- 8.5*  HGB 10.8* 11.5* 11.0* 10.4* 9.6*  HCT 33.2* 34.6* 32.6* 31.9* 29.6*  MCV 95.1 93.8 93.7 94.9 95.2  PLT 194 184 178 177 173   Cardiac Enzymes:  Lab 08/20/12 0430  CKTOTAL --  CKMB --  CKMBINDEX --  TROPONINI <0.30   BNP (last 3 results) No results found for this basename: PROBNP:3  in the last 8760 hours CBG:  Lab 08/26/12 0723 08/25/12 2020 08/25/12 1635 08/25/12 1123 08/25/12 0821  GLUCAP 243* 117* 252* 210* 260*    Recent Results (from the past 240 hour(s))  URINE CULTURE     Status: Normal   Collection Time   08/16/12  4:14 PM      Component Value Range Status Comment   Colony Count NO GROWTH   Final    Organism ID, Bacteria NO GROWTH   Final   MRSA PCR SCREENING     Status: Normal   Collection Time   08/19/12  1:33 AM      Component Value Range Status Comment   MRSA by PCR NEGATIVE  NEGATIVE Final   CULTURE, BLOOD (ROUTINE X 2)     Status: Normal   Collection Time   08/19/12 10:02 PM      Component Value Range Status Comment   Specimen Description  BLOOD RIGHT HAND   Final    Special Requests BOTTLES DRAWN AEROBIC ONLY 1CC   Final    Culture  Setup Time 08/20/2012 03:49   Final    Culture NO GROWTH 5 DAYS   Final    Report Status 08/26/2012 FINAL   Final   CULTURE, BLOOD (ROUTINE X 2)     Status: Normal   Collection Time   08/19/12 10:04 PM      Component Value Range Status Comment   Specimen Description BLOOD RIGHT FOREARM   Final    Special Requests BOTTLES DRAWN AEROBIC ONLY 1CC   Final    Culture  Setup Time 08/20/2012 03:49   Final    Culture NO GROWTH 5 DAYS   Final    Report Status 08/26/2012 FINAL   Final   URINE CULTURE     Status: Normal   Collection Time   08/20/12 12:13 AM      Component Value Range Status Comment   Specimen Description URINE, CATHETERIZED   Final    Special Requests Normal   Final    Culture  Setup Time 08/20/2012 01:19   Final    Colony Count NO GROWTH   Final    Culture NO GROWTH   Final    Report Status 08/21/2012 FINAL   Final   URINE CULTURE     Status: Normal   Collection Time   08/21/12  2:21 AM      Component Value Range Status Comment   Specimen Description URINE, CATHETERIZED   Final    Special Requests NONE   Final    Culture  Setup Time 08/21/2012 13:41   Final    Colony Count NO GROWTH   Final    Culture NO GROWTH   Final    Report Status 08/22/2012 FINAL   Final   CULTURE, BLOOD (ROUTINE X 2)     Status: Normal (Preliminary result)   Collection Time   08/21/12  2:45 AM      Component Value Range Status Comment   Specimen Description BLOOD LEFT ARM   Final    Special Requests BOTTLES DRAWN AEROBIC ONLY [facility]   Final    Culture  Setup Time 08/21/2012 12:25   Final    Culture     Final    Value:        BLOOD CULTURE RECEIVED NO GROWTH TO DATE CULTURE WILL BE HELD FOR 5 DAYS BEFORE ISSUING A FINAL NEGATIVE REPORT   Report Status PENDING   Incomplete   CULTURE, BLOOD (  ROUTINE X 2)     Status: Normal (Preliminary result)   Collection Time   08/21/12  2:50 AM      Component Value  Range Status Comment   Specimen Description BLOOD RIGHT HAND   Final    Special Requests BOTTLES DRAWN AEROBIC ONLY 10CC   Final    Culture  Setup Time 08/21/2012 12:25   Final    Culture     Final    Value:        BLOOD CULTURE RECEIVED NO GROWTH TO DATE CULTURE WILL BE HELD FOR 5 DAYS BEFORE ISSUING A FINAL NEGATIVE REPORT   Report Status PENDING   Incomplete      Studies:  Recent x-ray studies have been reviewed in detail by the Attending Physician  Scheduled Meds:  Reviewed in detail by the Attending Physician  Scribed by Bevelyn Ngo, RN ACNP Student [employer] for Dr. Calvert Cantor.   On-Call/Text Page:      [email or web address]      password [email or web address]  If 7PM-7AM, please contact night-coverage [email or web address] Password [email or web address] 08/26/2012, 1:14 PM   LOS: 8 days   I have examined the patient, reviewed the chart and modified the above note which I agree with.  Calvert Cantor, MD [phone]

## 2012-08-26 NOTE — Progress Notes (Signed)
Speech Language Pathology Dysphagia Treatment Patient Details Name: [name] [name] MRN: [id number] DOB: Aug 25, 1941 Today's Date: 08/26/2012 Time: 1100-1130 SLP Time Calculation (min): 30 min  Assessment / Plan / Recommendation Clinical Impression  Pt. seen for dysphagia treatment, LOA continues to improve though still somewhat confused. Oral phase marked by anterior loss of bolus due to decreased bolus awareness and delayed oral transit. Pharyngeal phase characterized by suspected delayed swallow initiation, decreased laryngeal elevation, and multiple swallows, indicative of residue. Immediate cough observed x1 with honey thick liquids, likely from pt. attempting to speak while consuming POs.  She required max verbal and tactile cues for all swallow precautions.  Continue Dys 1 diet with honey thick liquids (via tsp.). Full supervision is also recommended to facilitate compensatory strategies.     Diet Recommendation  Continue with Current Diet: Dysphagia 1 (puree);Honey-thick liquid    SLP Plan Continue with current plan of care      Swallowing Goals  SLP Swallowing Goals Patient will consume recommended diet without observed clinical signs of aspiration with: Moderate cueing Swallow Study Goal #1 - Progress: Progressing toward goal Patient will utilize recommended strategies during swallow to increase swallowing safety with: Moderate cueing Swallow Study Goal #2 - Progress: Progressing toward goal Goal #3: Pt. will improve laryngeal elevation/tongue base retraction through trial of pharyngeal swallow exercises with max verbal/visual cues.  Swallow Study Goal #3 - Progress: Progressing toward goal Goal #4: Pt. will sustain attention for 3 minutes while consuming PO trials with min verbal cueing  Swallow Study Goal #4 - Progress: Progressing toward goal  General Temperature Spikes Noted: No Respiratory Status: Room air Behavior/Cognition: Alert;Confused;Requires cueing;Distractible Oral  Cavity - Dentition: Adequate natural dentition Patient Positioning: Upright in chair  Oral Cavity - Oral Hygiene Does patient have any of the following "at risk" factors?: Nutritional status - dependent feeder Brush patient's teeth BID with toothbrush (using toothpaste with fluoride): Yes Patient is HIGH RISK - Oral Care Protocol followed (see row info): Yes Patient is AT RISK - Oral Care Protocol followed (see row info): Yes   Dysphagia Treatment Treatment focused on: Skilled observation of diet tolerance;Patient/family/caregiver education;Utilization of compensatory strategies Treatment Methods/Modalities: Skilled observation Patient observed directly with PO's: Yes Type of PO's observed: Dysphagia 1 (puree);Honey-thick liquids Feeding: Able to feed self;Needs assist;Needs set up Liquids provided via: Teaspoon Oral Phase Signs & Symptoms: Anterior loss/spillage;Prolonged oral phase Pharyngeal Phase Signs & Symptoms: Suspected delayed swallow initiation;Multiple swallows;Immediate cough Type of cueing: Verbal;Tactile;Visual Amount of cueing: Maximal   GO     Theotis Burrow 08/26/2012, 1:26 PM

## 2012-08-26 NOTE — Progress Notes (Signed)
SLP has reviewed and agrees with student's note below.  Zakiyah Diop Willis Kylah Maresh M.Ed CCC-SLP Pager [phone]  08/26/2012  

## 2012-08-26 NOTE — Progress Notes (Signed)
Pt report called and pt transferred to 4N, hooked up to telemetry .VSS. All meds given up to current time.

## 2012-08-27 ENCOUNTER — Inpatient Hospital Stay (HOSPITAL_COMMUNITY)
Admission: AD | Admit: 2012-08-27 | Discharge: 2012-09-10 | DRG: 945 | Disposition: A | Discharge status: Home Health | Payer: Medicare Other | Source: Ambulatory Visit | Attending: Physical Medicine & Rehabilitation | Admitting: Physical Medicine & Rehabilitation

## 2012-08-27 DIAGNOSIS — Z23 Encounter for immunization: Secondary | ICD-10-CM

## 2012-08-27 DIAGNOSIS — R4020 Unspecified coma: Secondary | ICD-10-CM

## 2012-08-27 DIAGNOSIS — D62 Acute posthemorrhagic anemia: Secondary | ICD-10-CM | POA: Diagnosis present

## 2012-08-27 DIAGNOSIS — G40909 Epilepsy, unspecified, not intractable, without status epilepticus: Secondary | ICD-10-CM | POA: Diagnosis present

## 2012-08-27 DIAGNOSIS — D72829 Elevated white blood cell count, unspecified: Secondary | ICD-10-CM | POA: Diagnosis present

## 2012-08-27 DIAGNOSIS — I619 Nontraumatic intracerebral hemorrhage, unspecified: Secondary | ICD-10-CM

## 2012-08-27 DIAGNOSIS — R55 Syncope and collapse: Secondary | ICD-10-CM

## 2012-08-27 DIAGNOSIS — K219 Gastro-esophageal reflux disease without esophagitis: Secondary | ICD-10-CM | POA: Diagnosis present

## 2012-08-27 DIAGNOSIS — Z794 Long term (current) use of insulin: Secondary | ICD-10-CM

## 2012-08-27 DIAGNOSIS — J690 Pneumonitis due to inhalation of food and vomit: Secondary | ICD-10-CM | POA: Diagnosis present

## 2012-08-27 DIAGNOSIS — R1310 Dysphagia, unspecified: Secondary | ICD-10-CM | POA: Diagnosis present

## 2012-08-27 DIAGNOSIS — E118 Type 2 diabetes mellitus with unspecified complications: Secondary | ICD-10-CM | POA: Diagnosis present

## 2012-08-27 DIAGNOSIS — R569 Unspecified convulsions: Secondary | ICD-10-CM | POA: Diagnosis present

## 2012-08-27 DIAGNOSIS — Z5189 Encounter for other specified aftercare: Principal | ICD-10-CM

## 2012-08-27 DIAGNOSIS — R000 Tachycardia, unspecified: Secondary | ICD-10-CM | POA: Diagnosis present

## 2012-08-27 DIAGNOSIS — R64 Cachexia: Secondary | ICD-10-CM | POA: Diagnosis present

## 2012-08-27 DIAGNOSIS — J90 Pleural effusion, not elsewhere classified: Secondary | ICD-10-CM | POA: Diagnosis present

## 2012-08-27 DIAGNOSIS — E876 Hypokalemia: Secondary | ICD-10-CM | POA: Diagnosis present

## 2012-08-27 DIAGNOSIS — E1169 Type 2 diabetes mellitus with other specified complication: Secondary | ICD-10-CM | POA: Diagnosis present

## 2012-08-27 DIAGNOSIS — I10 Essential (primary) hypertension: Secondary | ICD-10-CM | POA: Diagnosis present

## 2012-08-27 LAB — URINALYSIS, ROUTINE W REFLEX MICROSCOPIC
Bilirubin Urine: NEGATIVE
Nitrite: NEGATIVE
Specific Gravity, Urine: 1.013 (ref 1.005–1.030)
Urobilinogen, UA: 0.2 mg/dL (ref 0.0–1.0)

## 2012-08-27 LAB — CULTURE, BLOOD (ROUTINE X 2): Culture: NO GROWTH

## 2012-08-27 LAB — BASIC METABOLIC PANEL
CO2: 29 mEq/L (ref 19–32)
Calcium: 8.3 mg/dL — ABNORMAL LOW (ref 8.4–10.5)
Creatinine, Ser: 1.03 mg/dL (ref 0.50–1.10)
GFR calc Af Amer: 62 mL/min — ABNORMAL LOW (ref >90)
GFR calc non Af Amer: 53 mL/min — ABNORMAL LOW (ref >90)
Sodium: 142 mEq/L (ref 135–145)

## 2012-08-27 LAB — GLUCOSE, CAPILLARY
Glucose-Capillary: 277 mg/dL — ABNORMAL HIGH (ref 70–99)
Glucose-Capillary: 73 mg/dL (ref 70–99)
Glucose-Capillary: 204 mg/dL — ABNORMAL HIGH (ref 70–99)

## 2012-08-27 LAB — CBC
Platelets: 281 10*3/uL (ref 150–400)
RBC: 3.11 MIL/uL — ABNORMAL LOW (ref 3.87–5.11)
RDW: 12.9 % (ref 11.5–15.5)
WBC: 10.5 10*3/uL (ref 4.0–10.5)

## 2012-08-27 LAB — URINE MICROSCOPIC-ADD ON

## 2012-08-27 MED ORDER — INSULIN DETEMIR 100 UNIT/ML ~~LOC~~ SOLN
25.0000 [IU] | Freq: Every day | SUBCUTANEOUS | Status: DC
  Filled 2012-08-27: qty 10

## 2012-08-27 MED ORDER — STARCH (THICKENING) PO POWD: ORAL | Status: DC

## 2012-08-27 MED ORDER — METOPROLOL TARTRATE 50 MG PO TABS
50.0000 mg | ORAL_TABLET | Freq: Two times a day (BID) | ORAL | Status: DC
  Administered 2012-08-27: 50 mg via ORAL
  Filled 2012-08-27 (×2): qty 1

## 2012-08-27 MED ORDER — PROCHLORPERAZINE MALEATE 5 MG PO TABS
5.0000 mg | ORAL_TABLET | Freq: Four times a day (QID) | ORAL | Status: DC | PRN
  Filled 2012-08-27: qty 2

## 2012-08-27 MED ORDER — PNEUMOCOCCAL VAC POLYVALENT 25 MCG/0.5ML IJ INJ
0.5000 mL | INJECTION | INTRAMUSCULAR | Status: AC
  Filled 2012-08-27: qty 0.5

## 2012-08-27 MED ORDER — GUAIFENESIN-DM 100-10 MG/5ML PO SYRP: 5.0000 mL | ORAL_SOLUTION | Freq: Four times a day (QID) | ORAL | Status: DC | PRN

## 2012-08-27 MED ORDER — VITAMIN C 500 MG PO TABS
500.0000 mg | ORAL_TABLET | Freq: Every day | ORAL | Status: DC
  Administered 2012-08-28 – 2012-09-10 (×14): 500 mg via ORAL
  Filled 2012-08-27 (×16): qty 1

## 2012-08-27 MED ORDER — INSULIN ASPART 100 UNIT/ML ~~LOC~~ SOLN
0.0000 [IU] | Freq: Three times a day (TID) | SUBCUTANEOUS | Status: DC
  Administered 2012-08-28: 15 [IU] via SUBCUTANEOUS
  Administered 2012-08-28: 2 [IU] via SUBCUTANEOUS
  Administered 2012-08-29: 3 [IU] via SUBCUTANEOUS
  Administered 2012-08-30: 11 [IU] via SUBCUTANEOUS
  Administered 2012-08-30: 09:00:00 via SUBCUTANEOUS
  Administered 2012-08-30: 3 [IU] via SUBCUTANEOUS
  Administered 2012-08-31: 11 [IU] via SUBCUTANEOUS
  Administered 2012-08-31: 3 [IU] via SUBCUTANEOUS
  Administered 2012-09-01: 2 [IU] via SUBCUTANEOUS
  Administered 2012-09-01: 8 [IU] via SUBCUTANEOUS
  Administered 2012-09-02: 2 [IU] via SUBCUTANEOUS
  Administered 2012-09-02: 11 [IU] via SUBCUTANEOUS
  Administered 2012-09-03: 2 [IU] via SUBCUTANEOUS
  Administered 2012-09-04: 11 [IU] via SUBCUTANEOUS
  Administered 2012-09-05: 3 [IU] via SUBCUTANEOUS
  Administered 2012-09-05: 5 [IU] via SUBCUTANEOUS
  Administered 2012-09-06: 2 [IU] via SUBCUTANEOUS
  Administered 2012-09-06: 3 [IU] via SUBCUTANEOUS
  Administered 2012-09-07: 8 [IU] via SUBCUTANEOUS
  Administered 2012-09-07 (×2): 3 [IU] via SUBCUTANEOUS
  Administered 2012-09-08: 11 [IU] via SUBCUTANEOUS
  Administered 2012-09-09: 2 [IU] via SUBCUTANEOUS
  Administered 2012-09-09: 8 [IU] via SUBCUTANEOUS
  Administered 2012-09-09 – 2012-09-10 (×2): 2 [IU] via SUBCUTANEOUS

## 2012-08-27 MED ORDER — PROCHLORPERAZINE EDISYLATE 5 MG/ML IJ SOLN
5.0000 mg | Freq: Four times a day (QID) | INTRAMUSCULAR | Status: DC | PRN
  Filled 2012-08-27: qty 2

## 2012-08-27 MED ORDER — METOPROLOL TARTRATE 50 MG PO TABS
50.0000 mg | ORAL_TABLET | Freq: Two times a day (BID) | ORAL | Status: DC
  Administered 2012-08-27 – 2012-09-07 (×22): 50 mg via ORAL
  Filled 2012-08-27 (×26): qty 1

## 2012-08-27 MED ORDER — SENNOSIDES 8.8 MG/5ML PO SYRP
10.0000 mL | ORAL_SOLUTION | Freq: Every evening | ORAL | Status: DC | PRN
  Filled 2012-08-27: qty 10

## 2012-08-27 MED ORDER — METOPROLOL TARTRATE 25 MG PO TABS: 50.0000 mg | ORAL_TABLET | Freq: Two times a day (BID) | ORAL | Status: DC

## 2012-08-27 MED ORDER — ACETAMINOPHEN 325 MG PO TABS: 650.0000 mg | ORAL_TABLET | Freq: Four times a day (QID) | ORAL | Status: DC | PRN

## 2012-08-27 MED ORDER — INSULIN DETEMIR 100 UNIT/ML ~~LOC~~ SOLN
20.0000 [IU] | Freq: Every day | SUBCUTANEOUS | Status: DC
  Administered 2012-08-28: 20 [IU] via SUBCUTANEOUS
  Filled 2012-08-27: qty 10

## 2012-08-27 MED ORDER — RESOURCE THICKENUP CLEAR PO POWD
ORAL | Status: DC | PRN
  Filled 2012-08-27: qty 125

## 2012-08-27 MED ORDER — DIPHENHYDRAMINE HCL 12.5 MG/5ML PO ELIX: 12.5000 mg | ORAL_SOLUTION | Freq: Four times a day (QID) | ORAL | Status: DC | PRN

## 2012-08-27 MED ORDER — INSULIN DETEMIR 100 UNIT/ML ~~LOC~~ SOLN
20.0000 [IU] | Freq: Every day | SUBCUTANEOUS | Status: DC
  Administered 2012-08-27: 20 [IU] via SUBCUTANEOUS
  Filled 2012-08-27: qty 10

## 2012-08-27 MED ORDER — POTASSIUM CHLORIDE 20 MEQ/15ML (10%) PO LIQD
20.0000 meq | Freq: Once | ORAL | Status: AC
  Administered 2012-08-27: 20 meq via ORAL
  Filled 2012-08-27 (×2): qty 15

## 2012-08-27 MED ORDER — ADULT MULTIVITAMIN W/MINERALS CH
1.0000 | ORAL_TABLET | Freq: Every day | ORAL | Status: DC
  Administered 2012-08-28 – 2012-09-10 (×14): 1 via ORAL
  Filled 2012-08-27 (×16): qty 1

## 2012-08-27 MED ORDER — PROCHLORPERAZINE 25 MG RE SUPP
12.5000 mg | Freq: Four times a day (QID) | RECTAL | Status: DC | PRN
  Filled 2012-08-27: qty 1

## 2012-08-27 MED ORDER — ACETAMINOPHEN 325 MG PO TABS
325.0000 mg | ORAL_TABLET | ORAL | Status: DC | PRN
  Administered 2012-08-28 – 2012-09-07 (×8): 650 mg via ORAL
  Filled 2012-08-27 (×10): qty 2

## 2012-08-27 MED ORDER — ALUM & MAG HYDROXIDE-SIMETH 200-200-20 MG/5ML PO SUSP
30.0000 mL | ORAL | Status: DC | PRN
  Administered 2012-09-01 – 2012-09-04 (×2): 30 mL via ORAL
  Filled 2012-08-27 (×3): qty 30

## 2012-08-27 MED ORDER — PANTOPRAZOLE SODIUM 40 MG PO PACK
40.0000 mg | PACK | Freq: Every day | ORAL | Status: DC
  Administered 2012-08-28 – 2012-09-10 (×14): 40 mg via ORAL
  Filled 2012-08-27 (×16): qty 20

## 2012-08-27 MED ORDER — LEVETIRACETAM 250 MG PO TABS
250.0000 mg | ORAL_TABLET | Freq: Two times a day (BID) | ORAL | Status: DC
  Administered 2012-08-27: 250 mg via ORAL
  Filled 2012-08-27 (×2): qty 1

## 2012-08-27 MED ORDER — LEVETIRACETAM 250 MG PO TABS: 250.0000 mg | ORAL_TABLET | Freq: Two times a day (BID) | ORAL | Status: DC

## 2012-08-27 MED ORDER — TRAZODONE HCL 50 MG PO TABS
25.0000 mg | ORAL_TABLET | Freq: Every evening | ORAL | Status: DC | PRN
  Administered 2012-08-29: 25 mg via ORAL
  Administered 2012-08-31 – 2012-09-04 (×2): 50 mg via ORAL
  Filled 2012-08-27 (×3): qty 1

## 2012-08-27 MED ORDER — POTASSIUM CHLORIDE IN NACL 20-0.9 MEQ/L-% IV SOLN
INTRAVENOUS | Status: AC
  Administered 2012-08-27: 20:00:00 via INTRAVENOUS
  Filled 2012-08-27: qty 1000

## 2012-08-27 MED ORDER — BISACODYL 10 MG RE SUPP: 10.0000 mg | Freq: Every day | RECTAL | Status: DC | PRN

## 2012-08-27 MED ORDER — LEVETIRACETAM 250 MG PO TABS
250.0000 mg | ORAL_TABLET | Freq: Two times a day (BID) | ORAL | Status: DC
  Administered 2012-08-27 – 2012-09-10 (×28): 250 mg via ORAL
  Filled 2012-08-27 (×30): qty 1

## 2012-08-27 MED ORDER — CHLORHEXIDINE GLUCONATE 0.12 % MT SOLN
15.0000 mL | Freq: Two times a day (BID) | OROMUCOSAL | Status: DC
  Administered 2012-08-27 – 2012-08-29 (×4): 15 mL via OROMUCOSAL
  Filled 2012-08-27 (×6): qty 15

## 2012-08-27 MED ORDER — BIOTENE DRY MOUTH MT LIQD
15.0000 mL | Freq: Four times a day (QID) | OROMUCOSAL | Status: DC
  Administered 2012-08-28 – 2012-08-29 (×6): 15 mL via OROMUCOSAL

## 2012-08-27 NOTE — Progress Notes (Signed)
Pt admitted to room 4005 from [facility]; husband at [facility]; Pts temp 100.4; Pam Love, PAC aware, foley d/c'd at 1800. U/a, c/s sent. Pt drowsy but arouses easily.  Orientation fluctuates, x 4 at present. Carlean Purl

## 2012-08-27 NOTE — Progress Notes (Signed)
Discussed discharge info and stroke education with pt's husband who showed no barriers to discharge. Assessment unchanged from morning. Report given to inpatient rehab. IV to remain. Foley to remain per Dr. Jomarie Longs - pt has history of urinary retention. Pt transferred to inpatient rehab with belongings.

## 2012-08-27 NOTE — PMR Pre-admission (Signed)
PMR Admission Coordinator Pre-Admission Assessment  Patient: [name] [name] is an 71 y.o., female MRN: [id number] DOB: 04/27/1941 Height: 5\' 5"  (165.1 cm) Weight: 53.3 kg (117 lb 8.1 oz)  Insurance Information HMO:    PPO:       PCP:       IPA:       80/20: yes     OTHER:   PRIMARY: Medicare      Policy#: [id number]      Subscriber: pt CM Name:        Phone#:       Fax#:   Pre-Cert#:        Employer:   Benefits:  Phone #:       Name: Armed forces technical officer. Date: 01/01/06     Deduct: $1184.00      Out of Pocket Max: none      Life Max: none CIR: 100%      SNF: 20 days 100% days 21-100 copay $148.00/day Outpatient: 80%     Co-Pay: 20% Home Health: 100%      Co-Pay: 20% on any DME used DME: 80%     Co-Pay: 20% Providers: pt's choice  SECONDARY: Sharen Counter      Policy#: [id number]      Subscriber: Cheri Fowler      Emergency Contact Information Contact Information    Name Relation Home Work Mobile   Beery,Roger A Spouse [phone]  [phone]     Current Medical History  Patient Admitting Diagnosis: right temporal occipital hemorrhage  History of Present Illness: 71 y.o. female with history of DM, fall 10 days PTA on 08/18/12 with worsening of HA and 3 day history of running into things on the left with gait problems. MRI head done on outpatient basis with right posterior temporo-occipital hemorrhage and patient admitted for workup. Patient developed seizure on 10/17 pm and was loaded with dilantin and started on Keppra. EEG done revealing moderately severe, continuous nonspecific slowing of cerebral activity with question of encephalopathy v/s post ictal state. She did develop fever with hypotension and patient intubated on 10/18. CCM consulted for input and patient started on IV zosyn for presumed aspiration PNA. Follow up EEG 10/19 with marked improvement. Serial CCT done showing stable appearance of bleed. Extubated on 10/19 and has been confused with bouts of agitation. keppra decreased to  250 mg bid on 10/21 due to lethargy. Repeat EEG with moderately severe generalized slowing on 10/22 as well as PLED's. Neurology feels that patient with hemorrhage due to hypertension and excessive ASA use.  PT/OT evaluations done.  Total: 6     Past Medical History  Past Medical History  Diagnosis Date  . Diabetes mellitus   . GERD (gastroesophageal reflux disease)     Family History  family history includes Cancer in her father and mother.  Prior Rehab/Hospitalizations:    Current Medications  Current facility-administered medications:acetaminophen (TYLENOL) suppository 650 mg, 650 mg, Rectal, Q4H PRN, Gerome Apley [place], PA, 650 mg at 08/23/12 1201;  acetaminophen (TYLENOL) tablet 650 mg, 650 mg, Oral, Q6H PRN, Lonia Blood, MD;  antiseptic oral rinse (BIOTENE) solution 15 mL, 15 mL, Mouth Rinse, QID, Nelda Bucks, MD, 15 mL at 08/27/12 0400 chlorhexidine (PERIDEX) 0.12 % solution 15 mL, 15 mL, Mouth Rinse, BID, Nelda Bucks, MD, 15 mL at 08/26/12 2000;  food thickener (THICK IT) powder, , Oral, PRN, Layne Benton, NP;  insulin aspart (novoLOG) injection 0-15 Units, 0-15 Units, Subcutaneous, TID WC, Lonia Blood, MD,  8 Units at 08/27/12 0752;  insulin detemir (LEVEMIR) injection 20 Units, 20 Units, Subcutaneous, Q breakfast, Zannie Cove, MD labetalol (NORMODYNE,TRANDATE) injection 10 mg, 10 mg, Intravenous, Q2H PRN, Layne Benton, NP;  levETIRAcetam (KEPPRA) tablet 250 mg, 250 mg, Oral, BID, Zannie Cove, MD;  metoprolol (LOPRESSOR) tablet 50 mg, 50 mg, Oral, BID, Zannie Cove, MD;  multivitamin with minerals tablet 1 tablet, 1 tablet, Oral, Daily, Calvert Cantor, MD, 1 tablet at 08/26/12 2213 ondansetron [facility]) injection 4 mg, 4 mg, Intravenous, Q6H PRN, [name], MD, 4 mg at 08/25/12 0240;  pantoprazole sodium (PROTONIX) 40 mg/20 mL oral suspension 40 mg, 40 mg, Oral, Daily, Lonia Blood, MD, 40 mg at 08/26/12 1115;  potassium chloride 20 MEQ/15ML (10%) liquid  40 mEq, 40 mEq, Oral, BID, Lonia Blood, MD, 40 mEq at 08/26/12 2214 vitamin C (ASCORBIC ACID) tablet 500 mg, 500 mg, Oral, Daily, Calvert Cantor, MD, 500 mg at 08/26/12 2212;  DISCONTD: 0.9 %  sodium chloride infusion, , Intravenous, Continuous, Lonia Blood, MD, Last Rate: 50 mL/hr at 08/25/12 2300;  DISCONTD: dextrose 5 %-0.45 % sodium chloride infusion, , Intravenous, Continuous, Calvert Cantor, MD, Last Rate: 50 mL/hr at 08/26/12 1015 DISCONTD: insulin detemir (LEVEMIR) injection 15 Units, 15 Units, Subcutaneous, Q breakfast, Lonia Blood, MD;  DISCONTD: insulin detemir (LEVEMIR) injection 15 Units, 15 Units, Subcutaneous, Q breakfast, Calvert Cantor, MD;  DISCONTD: insulin detemir (LEVEMIR) injection 18 Units, 18 Units, Subcutaneous, Q breakfast, Calvert Cantor, MD;  DISCONTD: insulin detemir (LEVEMIR) injection 25 Units, 25 Units, Subcutaneous, Q breakfast, Zannie Cove, MD DISCONTD: levETIRAcetam (KEPPRA) 250 mg in sodium chloride 0.9 % 100 mL IVPB, 250 mg, Intravenous, BID, Layne Benton, NP, 250 mg at 08/26/12 2213;  DISCONTD: metoprolol tartrate (LOPRESSOR) tablet 12.5 mg, 12.5 mg, Oral, BID, Lonia Blood, MD, 12.5 mg at 08/25/12 2144;  DISCONTD: metoprolol tartrate (LOPRESSOR) tablet 25 mg, 25 mg, Oral, BID, Calvert Cantor, MD, 25 mg at 08/26/12 2212  Patients Current Diet: Dysphagia D1 Honey thick  Precautions / Restrictions Precautions Precautions: Fall Restrictions Weight Bearing Restrictions: No   Prior Activity Level Community (5-7x/wk): Daily outings Journalist, newspaper / Equipment Home Assistive Devices/Equipment: CBG Meter  Prior Functional Level Prior Function Level of Independence: Independent Able to Take Stairs?: Yes Driving: No Vocation: Retired Comments: pt not answering questions, unsure of setup  Current Functional Level Cognition  Arousal/Alertness: Awake/alert Overall Cognitive Status: Impaired Overall Cognitive Status: Impaired Current  Attention Level: Sustained Attention - Other Comments: pt unable to pay attention at all Orientation Level: Oriented to person;Disoriented to time;Disoriented to situation;Disoriented to place Following Commands: Follows one step commands inconsistently Safety/Judgement: Decreased awareness of need for assistance;Decreased safety judgement for tasks assessed Awareness of Errors: Assistance required to identify errors made;Assistance required to correct errors made Awareness of Deficits: unaware of deficits Cognition - Other Comments: pt attempting to stand without assist  Attention: Focused;Sustained Focused Attention: Appears intact Sustained Attention: Appears intact Memory: Impaired Memory Impairment: Decreased recall of new information;Decreased short term memory Decreased Short Term Memory: Functional basic;Functional complex Awareness: Impaired Awareness Impairment: Emergent impairment;Anticipatory impairment;Intellectual impairment Problem Solving: Impaired Problem Solving Impairment: Functional basic;Functional complex Executive Function: Self Monitoring Self Monitoring: Impaired Self Monitoring Impairment: Functional basic;Functional complex Safety/Judgment: Impaired    Extremity Assessment (includes Sensation/Coordination)     RLE ROM/Strength/Tone: Unable to fully assess;Due to impaired cognition;Deficits RLE ROM/Strength/Tone Deficits: MMT >/= 3/5. Pt unable cognitively to follow resistance directions    ADLs  Eating/Feeding: NPO Grooming: Performed;Minimal  assistance;Wash/dry face Where Assessed - Grooming: Supported sitting Upper Body Bathing: Simulated;+1 Total assistance Where Assessed - Upper Body Bathing: Supine, head of bed up Lower Body Bathing: Simulated;+1 Total assistance Where Assessed - Lower Body Bathing: Supine, head of bed up;Supine, head of bed flat;Rolling right and/or left Upper Body Dressing: Simulated;+1 Total assistance Where Assessed - Upper  Body Dressing: Supine, head of bed up Lower Body Dressing: Simulated;+1 Total assistance Where Assessed - Lower Body Dressing: Supine, head of bed up;Supine, head of bed flat;Rolling right and/or left Toilet Transfer: Simulated;Minimal assistance Toilet Transfer: Patient Percentage: 60% Toilet Transfer Method: Sit to Barista:  (from bed) Toileting - Clothing Manipulation and Hygiene: Simulated;Moderate assistance Toileting - Clothing Manipulation and Hygiene: Patient Percentage: 0% Where Assessed - Toileting Clothing Manipulation and Hygiene: Standing Equipment Used: Gait belt;Rolling walker Transfers/Ambulation Related to ADLs: Min A with sit to stand from bed; pt anxious to get OOB and ambulate. Able to walk ~71ft before needing to sit secondary to fatigue ADL Comments: Pt continues with visual deficits, but is now able to move eye outside of midline- less so on command. Pt reports double vision in left eye with right eye closed and questionable field deficit in right eye, however, pt report varies and she was unable to attend long enough for thorough examination. Will continue to assess and tx as appropriate    Mobility  Bed Mobility: Rolling Right;Supine to Sit;Sitting - Scoot to Edge of Bed Rolling Right: 3: Mod assist Right Sidelying to Sit: 3: Mod assist Right Sidelying to Sit: Patient Percentage: 0% Supine to Sit: 3: Mod assist Sitting - Scoot to Edge of Bed: 4: Min assist    Transfers  Transfers: Sit to Stand;Stand to Sit Sit to Stand: From bed;From chair/3-in-1 (times 4) Sit to Stand: Patient Percentage: 60% Stand to Sit: 4: Min assist;To bed;To chair/3-in-1;With upper extremity assist Stand to Sit: Patient Percentage: 60% Stand Pivot Transfers: 1: +2 Total assist Stand Pivot Transfers: Patient Percentage: 70%    Ambulation / Gait / Stairs / Wheelchair Mobility  Ambulation/Gait Ambulation/Gait Assistance: 1: +2 Total assist Ambulation/Gait: Patient  Percentage: 60% Ambulation Distance (Feet): 15 Feet (then 20, then 24 with rests and BP taken each sit) Assistive device: Rolling walker Ambulation/Gait Assistance Details: gait characterized by scissored, tandem walking with less stepping on her toes today. Frequent need to correct posture and redirect her to task Gait Pattern: Step-through pattern;Decreased step length - right;Decreased step length - left;Decreased stride length;Scissoring;Trunk flexed;Narrow base of support Stairs: No    Posture / Balance Static Sitting Balance Static Sitting - Balance Support: No upper extremity supported;Feet supported Static Sitting - Level of Assistance: 5: Stand by assistance Static Sitting - Comment/# of Minutes: mod assist initially, however progressed to close supervision for up to 20 seconds Static Standing Balance Static Standing - Balance Support: Bilateral upper extremity supported;During functional activity Static Standing - Level of Assistance: 3: Mod assist Static Standing - Comment/# of Minutes: up to 1 minute; knees flexed and flexed trunk/forward head, Dynamic Standing Balance Dynamic Standing - Balance Support: Bilateral upper extremity supported;During functional activity Dynamic Standing - Level of Assistance: 1: +2 Total assist;Patient percentage (comment) (pt=60%) Dynamic Standing - Balance Activities: Other (comment) (marching in place) Dynamic Standing - Comments: Pt with decreased balance requiring assistance when weight bearing on LLE during marching     Previous Home Environment Living Arrangements: Spouse/significant other (And daughter lives in the home.) Lives With: Spouse;Daughter Available Help at Discharge: Family Type of  Home: House Home Layout: One level Home Access: Stairs to enter Entrance Stairs-Rails: None Entrance Stairs-Number of Steps: 1 Bathroom Shower/Tub: Engineer, manufacturing systems: Standard Home Care Services: No Additional Comments: No family  available to ger further info from  Discharge Living Setting Plans for Discharge Living Setting: Patient's home Type of Home at Discharge: House Discharge Home Layout: One level Discharge Home Access: Stairs to enter Entrance Stairs-Rails: None Entrance Stairs-Number of Steps: 1 Do you have any problems obtaining your medications?: No  Social/Family/Support Systems Patient Roles: Spouse;Parent Contact Information: [phone] Anticipated Caregiver: Husband and daughter Anticipated Caregiver's Contact Information: Lamees G[place]: [phone], [phone] Ability/Limitations of Caregiver: none Caregiver Availability: 24/7 Discharge Plan Discussed with Primary Caregiver: Yes Is Caregiver In Agreement with Plan?: Yes Does Caregiver/Family have Issues with Lodging/Transportation while Pt is in Rehab?: No  Goals/Additional Needs Patient/Family Goal for Rehab: PT: min A, OT:min-mod A, ST:Min A Expected length of stay: 3 weeks Cultural Considerations: none Dietary Needs: D-1, honey-thick liquids Equipment Needs: TBD Pt/Family Agrees to Admission and willing to participate: Yes Program Orientation Provided & Reviewed with Pt/Caregiver Including Roles  & Responsibilities: Yes  Patient Condition: This patient's condition remains as documented in the Consult dated 08/26/12, in which the Rehabilitation Physician determined and documented that the patient's condition is appropriate for intensive rehabilitative care in an inpatient rehabilitation facility.  Preadmission Screen Completed By:  Brock Ra, 08/27/2012 9:46 AM ______________________________________________________________________   Discussed status with Dr.Kirsteins on 08/27/12 at 9:52 and received telephone approval for admission today.  Admission Coordinator:  Brock Ra, time 9:55/Date 08/27/12

## 2012-08-27 NOTE — Progress Notes (Signed)
Occupational Therapy Treatment Patient Details Name: [name] [name] MRN: [id number] DOB: May 22, 1941 Today's Date: 08/27/2012 Time: 1191-4782 OT Time Calculation (min): 28 min  OT Assessment / Plan / Recommendation Comments on Treatment Session Pt making good progress with increased participation with OT. significant visualt processing and perceptual deficits. increased scanning into L field. Appropriate for CIR. Husband present for session.    Follow Up Recommendations  Inpatient Rehab    Barriers to Discharge       Equipment Recommendations  Other (comment)    Recommendations for Other Services Rehab consult  Frequency Min 3X/week   Plan Discharge plan remains appropriate    Precautions / Restrictions Precautions Precautions: Fall   Pertinent Vitals/Pain No pain    ADL  Eating/Feeding: Performed;Moderate assistance;Other (comment) (honey thick, teaspoon; cotreat with ST) ADL Comments: focus of session on postural control sitting EOB while self feeding grape juice from cup - honey thick with teaspoon. Encorpoating visual attention and sccanning into task. Difficulty maintaining visual fixation on target L of midline . Able to maintain gaze x 3 sec. Improved visual processin g in R field. undershooting when reaching for objects. Delay in processing and reasoning.     OT Diagnosis:    OT Problem List:   OT Treatment Interventions:     OT Goals Acute Rehab OT Goals OT Goal Formulation: Patient unable to participate in goal setting Time For Goal Achievement: 09/06/12 Potential to Achieve Goals: Good ADL Goals Pt Will Perform Grooming: with supervision;Sitting at sink;Standing at sink ADL Goal: Grooming - Progress: Progressing toward goals Pt Will Transfer to Toilet: with min assist;Ambulation;with DME ADL Goal: Toilet Transfer - Progress: Progressing toward goals Miscellaneous OT Goals Miscellaneous OT Goal #1: Pt will be able to sit EOB >5 minutes with min A in prep for  increased BADLS and transfers. OT Goal: Miscellaneous Goal #1 - Progress: Met Miscellaneous OT Goal #2: Pt will be able to follow 1 step commands 75% of the time. OT Goal: Miscellaneous Goal #2 - Progress: Met  Visit Information  Last OT Received On: 08/27/12 Assistance Needed: +1    Subjective Data      Prior Functioning       Cognition  Overall Cognitive Status: Impaired Area of Impairment: Attention;Safety/judgement;Awareness of errors;Awareness of deficits Arousal/Alertness: Lethargic Orientation Level: Disoriented to;Time;Situation Behavior During Session: Flat affect Current Attention Level: Sustained Attention - Other Comments: Able to maintain attention for greater periods. Internally distracted Following Commands: Follows one step commands consistently Safety/Judgement: Decreased awareness of need for assistance Awareness of Errors: Assistance required to identify errors made;Assistance required to correct errors made Awareness of Errors - Other Comments: unaware when her feet begin to scissor/cross when walking Awareness of Deficits: Beginning to demonstrate emergent awareness Problem Solving: Mod a for functional proble solving on how to scoot in bed Cognition - Other Comments: pt attempting to stand without assist     Mobility  Shoulder Instructions Bed Mobility Bed Mobility: Right Sidelying to Sit;Supine to Sit;Sitting - Scoot to Edge of Bed;Sit to Supine Rolling Right: 5: Supervision;With rail Right Sidelying to Sit: 4: Min assist;With rails;HOB flat Supine to Sit: 4: Min assist;HOB flat;With rails Sitting - Scoot to Edge of Bed: 5: Supervision Sit to Supine: 4: Min assist;With rail;HOB flat Details for Bed Mobility Assistance: vc for problem solving Transfers Sit to Stand: 4: Min guard;With upper extremity assist;With armrests;From bed;From chair/3-in-1 Stand to Sit: 4: Min guard;With upper extremity assist;With armrests;To chair/3-in-1 Details for Transfer  Assistance: vc for safe hand placement/use  of RW       Exercises    Balance Balance Balance Assessed: Yes Static Sitting Balance Static Sitting - Balance Support: Feet supported;No upper extremity supported (L sway. able to bring to midline. forward head, kyphotic) Static Sitting - Level of Assistance: 5: Stand by assistance Static Sitting - Comment/# of Minutes: impulsive with come to stand Static Standing Balance Static Standing - Balance Support: Bilateral upper extremity supported Static Standing - Level of Assistance: 4: Min assist Dynamic Standing Balance Dynamic Standing - Comments: Sat EOB while feeding self x 15 min   End of Session OT - End of Session Activity Tolerance: Patient tolerated treatment well Patient left: in bed;with call bell/phone within reach;with family/visitor present Nurse Communication: Mobility status  GO     Emanual Lamountain,HILLARY 08/27/2012, 11:29 AM Luisa Dago, OTR/L  [phone] 08/27/2012

## 2012-08-27 NOTE — Progress Notes (Signed)
Speech Language Pathology Dysphagia Treatment Patient Details Name: [name] [name] MRN: [id number] DOB: [birth year] Today's Date: 08/27/2012 Time: 0865-7846 SLP Time Calculation (min): 22 min  Assessment / Plan / Recommendation Clinical Impression  Treatment concentrated on pt. safety and efficiency with honey liquid.  Pt able to feed herself with set up and consumed honey thick juice via teaspoon without s/s aspiration.  Appeared to have mildly delayed swallow initiation.  She required max cues to perform multiple swallows and was able to state strategy at end of session with reminders provided throughout.  Processing of verbal information is delayed with frequent and moderate verbal cues to initiate action/activity.  Continue Dys 1 diet and honey thick liquids via teaspoon.  Pt. scheduled to transfer to inpatient rehab today with f/u ST for dysphagia and cognition.    Diet Recommendation  Continue with Current Diet: Dysphagia 1 (puree);Honey-thick liquid    SLP Plan Continue with current plan of care       Swallowing Goals  SLP Swallowing Goals Patient will consume recommended diet without observed clinical signs of aspiration with: Moderate cueing Swallow Study Goal #1 - Progress: Progressing toward goal Patient will utilize recommended strategies during swallow to increase swallowing safety with: Moderate cueing Swallow Study Goal #2 - Progress: Progressing toward goal  General Temperature Spikes Noted: No Respiratory Status: Room air Behavior/Cognition: Alert;Confused;Requires cueing;Distractible Oral Cavity - Dentition: Adequate natural dentition Patient Positioning: Upright in bed  Oral Cavity - Oral Hygiene Does patient have any of the following "at risk" factors?: Diet - patient on thickened liquids Brush patient's teeth BID with toothbrush (using toothpaste with fluoride): Yes Patient is HIGH RISK - Oral Care Protocol followed (see row info): Yes   Dysphagia  Treatment Treatment focused on: Skilled observation of diet tolerance;Patient/family/caregiver education Treatment Methods/Modalities: Skilled observation Patient observed directly with PO's: Yes Type of PO's observed: Honey-thick liquids Feeding: Able to feed self;Needs set up Liquids provided via: Teaspoon Pharyngeal Phase Signs & Symptoms: Suspected delayed swallow initiation Type of cueing: Verbal;Tactile;Visual Amount of cueing: Maximal       Royce Macadamia M.Ed [employer] [phone]  08/27/2012

## 2012-08-27 NOTE — Progress Notes (Signed)
[facility] Progress Note [place] TEAM 1 - Stepdown/ICU TEAM   [name] [name] WRU:[id number] DOB: [birth year] DOA: 08/18/2012 PCP: Aida Puffer, MD  Brief narrative: *71 y/o female was admitted on 10/17 to [facility] with a subacute intracranial bleed noted on an outpatient MRI ordered for headache. Within 24 hours of admission she developed fever, seizure, and ongoing confusion. Resp failure lead to ET 10/18, Extubation 10/19.  Events Since Admission:  10/15 MRI brain 1.3x2.2x1.4cm R posterior temporoparietal intraparenchymal hemorrhage consistent with an event occuring one week ago  10/17 CT head R posterior temporoparietal intraparenchymal hemorrhage 1.3 x 2.1 cm  10/17 CT head R posterior temporoparietal intraparenchymal hemorrhage without significant change  10/18- resp failure, intubation emergent, fever  10/18 EEG showed moderately severe, continuous, nonspecific, generalized slowing of cerebral activity. This pattern of slowing can be seen with number of different etiologies including degenerative and metabolic encephalopathies as well as postictal state following generalized seizure. No evidence of ongoing seizure activity was seen.  10/19 EEG shows mild, generalized, nonspecific, continuous slowing of cerebral activity with evidence of marked improvement compared to previous EEG study on August 19, 2012. No epileptiform discharges were recorded.  10/21 EEG showed moderately severe generalized slowing of cerebral activity as well as periodic lateralizing epileptiform discharges (PLED's). Lateralizing discharges on this type are most often seen following cerebral infarctions and are not typically associated with clinical seizure activity. This is a clear change from her EEG on November 13, 2011, which showed some mild generalized slowing. 10/23 CIR evaluation done, pt is appropriate for continued rehab at [facility], and has agreed to participate.   Assessment/Plan: Principal Problem:  *Hemorrhage in the brain-13 x 22 x 14 mm right posterior temporo-occipital intra-axial acute. * Neurology stroke service has signed off.   *Pt.To follow up with Dr. Pearlean Brownie [facility] in 2 months. * Plan to continue Keppra  250 mg per neurology suggestion. Change to PO * If clinical seizures recur, do not increase Keppra, but add Vimpat per neurology. * Ongoing SBP goal < 180   Active Problems:  * Encephalopathy acute:  * Secondary to Hemorrhage in the brain. * Pt continues to improve. * Answering questions in short phrases today, following simple commands. * Continue to avoid sedating drugs. * Continue to follow for resolution.  * Presumed Aspiration Pneumonia: * Appears to be resolved * Continues to SaO2 100% on room air. * No further antibiotic therapy.  * Seizures With Seizure Disorder: * Continue Keppra 250 mg per Neuro advice. * No further seizure activity noted. * If seizures recur, start Vimpat per neuro.  * Hypokalemia: * Potassium 3.2 today. * K was 3.0 yesterday. * Continue with supplemental Potassium 40 mEq BID today. * Repeat BMET in am.  *Sinus Tachycardia: * Pt. Remains tachycardic despite restarting home beta blocker regiment. * Increase  Dose of lopressor to 50 mg. BID.   * Chronic Kidney Disease Stage 3 GFR 30-59 ml/min: * BUN and Creatinine continue to trend down. * Values today 14 and 1.14 * GFR today is 47. * Continue to follow.  * Diabetes Mellitus II: * CBG's remain high. * Have modified patient's diet to a Carb Modified/Moderate to account for patient's    diabetic history. *  will increase Levemir coverage.  * Dysphagia: * Continue honey thick liquids and Dysphagia I Diet. * Encourage po thickened liquids. * Will re-evaluate as patients strength improves with follow-up swallow evaluation at later date.  * Hypertension: * Systolic B/P's are trending up into  150 range.  * Will continue to monitor with SBP goal of <180 per neuro  recommendations.  * Leukocytosis: * Continues to trend down; 12.4  10/24. * Continue to monitor-CBC in am.  * Left Hemiparesis: * Pt. Has bilateral arm and leg spontaneous movement without any focal deficits. * Good strong grips bilaterally, slightly stronger on the right. * Left eye does not blink to threat, right eye does blink to threat.   DVT prophylaxis: * SCD's Code Status: * Full Family Communication: * Spoke directly with patient   Disposition Plan: *CIR when bed avialable  Consultants: * Neurology ( signed off today 10/24) * Physical Medicine and Rehab   Procedures: * See events this admission   Antibiotics: *Amp 10/18 >> 10/19  Vanc 10/18 >> 10/19  Acyclovir 10/18 >> 10/19  Ceftriaxone 10/18 >> 10/19     HPI/Subjective: * Pt is easily arousable, and answers questions in short phrases. She denies headache, nausea or pain of any kind.    Objective: Blood pressure 143/70, pulse 114, temperature 99.5 F (37.5 C), temperature source Oral, resp. rate 22, height 5\' 5"  (1.651 m), weight 53.8 kg (118 lb 9.7 oz), SpO2 100.00%.  Intake/Output Summary (Last 24 hours) at 08/26/12 1314 Last data filed at 08/26/12 1200  Gross per 24 hour  Intake   1130 ml  Output   2175 ml  Net  -1045 ml     Exam: General: No acute respiratory distress Lungs: Clear to auscultation bilaterally without wheezes or crackles. SaO2 100% on room air. Cardiovascular: Regular rate and rhythm without murmur gallop or rub normal S1 and S2 Abdomen: Nontender, nondistended, soft, bowel sounds positive, no rebound, no ascites, no appreciable mass Extremities: No significant cyanosis, clubbing, or edema bilateral lower extremities Neuro: slightly decreased strength in LLE , otherwise nonfocal  Data Reviewed: Basic Metabolic Panel, CBC:  Lab 08/26/12 0450 08/25/12 0517 08/24/12 0505 08/23/12 0235 08/22/12 0417  NA 146* 148* 140 139 142  K 3.2* 3.0* 3.9 3.2* 3.9  CL 107 109 103 104  107  CO2 27 26 21 25 22   GLUCOSE 248* 329* 287* 109* 200*  BUN 14 16 13 7 7   CREATININE 1.14* 1.24* 1.16* 1.13* 1.06  CALCIUM 8.2* 8.2* 7.7* 7.7* 7.7*  MG -- 1.5 0.7* -- --  PHOS -- 2.1* 2.4 -- --   Liver Function Tests:  Lab 08/21/12 0602 08/20/12 0430  AST 52* 51*  ALT 26 29  ALKPHOS 66 85  BILITOT 0.3 0.5  PROT 5.1* 6.5  ALBUMIN 2.3* 3.2*   No results found for this basename: LIPASE:5,AMYLASE:5 in the last 168 hours  Lab 08/20/12 0021  AMMONIA 36   CBC:  Lab 08/25/12 0517 08/24/12 0505 08/23/12 0235 08/22/12 0417 08/21/12 0602  WBC 12.4* 16.8* 18.8* 15.6* 11.2*  NEUTROABS -- -- -- -- 8.5*  HGB 10.8* 11.5* 11.0* 10.4* 9.6*  HCT 33.2* 34.6* 32.6* 31.9* 29.6*  MCV 95.1 93.8 93.7 94.9 95.2  PLT 194 184 178 177 173   Cardiac Enzymes:  Lab 08/20/12 0430  CKTOTAL --  CKMB --  CKMBINDEX --  TROPONINI <0.30   BNP (last 3 results) No results found for this basename: PROBNP:3 in the last 8760 hours CBG:  Lab 08/26/12 0723 08/25/12 2020 08/25/12 1635 08/25/12 1123 08/25/12 0821  GLUCAP 243* 117* 252* 210* 260*    Recent Results (from the past 240 hour(s))  URINE CULTURE     Status: Normal   Collection Time   08/16/12  4:14  PM      Component Value Range Status Comment   Colony Count NO GROWTH   Final    Organism ID, Bacteria NO GROWTH   Final   MRSA PCR SCREENING     Status: Normal   Collection Time   08/19/12  1:33 AM      Component Value Range Status Comment   MRSA by PCR NEGATIVE  NEGATIVE Final   CULTURE, BLOOD (ROUTINE X 2)     Status: Normal   Collection Time   08/19/12 10:02 PM      Component Value Range Status Comment   Specimen Description BLOOD RIGHT HAND   Final    Special Requests BOTTLES DRAWN AEROBIC ONLY 1CC   Final    Culture  Setup Time 08/20/2012 03:49   Final    Culture NO GROWTH 5 DAYS   Final    Report Status 08/26/2012 FINAL   Final   CULTURE, BLOOD (ROUTINE X 2)     Status: Normal   Collection Time   08/19/12 10:04 PM       Component Value Range Status Comment   Specimen Description BLOOD RIGHT FOREARM   Final    Special Requests BOTTLES DRAWN AEROBIC ONLY 1CC   Final    Culture  Setup Time 08/20/2012 03:49   Final    Culture NO GROWTH 5 DAYS   Final    Report Status 08/26/2012 FINAL   Final   URINE CULTURE     Status: Normal   Collection Time   08/20/12 12:13 AM      Component Value Range Status Comment   Specimen Description URINE, CATHETERIZED   Final    Special Requests Normal   Final    Culture  Setup Time 08/20/2012 01:19   Final    Colony Count NO GROWTH   Final    Culture NO GROWTH   Final    Report Status 08/21/2012 FINAL   Final   URINE CULTURE     Status: Normal   Collection Time   08/21/12  2:21 AM      Component Value Range Status Comment   Specimen Description URINE, CATHETERIZED   Final    Special Requests NONE   Final    Culture  Setup Time 08/21/2012 13:41   Final    Colony Count NO GROWTH   Final    Culture NO GROWTH   Final    Report Status 08/22/2012 FINAL   Final   CULTURE, BLOOD (ROUTINE X 2)     Status: Normal (Preliminary result)   Collection Time   08/21/12  2:45 AM      Component Value Range Status Comment   Specimen Description BLOOD LEFT ARM   Final    Special Requests BOTTLES DRAWN AEROBIC ONLY 7CC   Final    Culture  Setup Time 08/21/2012 12:25   Final    Culture     Final    Value:        BLOOD CULTURE RECEIVED NO GROWTH TO DATE CULTURE WILL BE HELD FOR 5 DAYS BEFORE ISSUING A FINAL NEGATIVE REPORT   Report Status PENDING   Incomplete   CULTURE, BLOOD (ROUTINE X 2)     Status: Normal (Preliminary result)   Collection Time   08/21/12  2:50 AM      Component Value Range Status Comment   Specimen Description BLOOD RIGHT HAND   Final    Special Requests BOTTLES DRAWN AEROBIC ONLY 10CC  Final    Culture  Setup Time 08/21/2012 12:25   Final    Culture     Final    Value:        BLOOD CULTURE RECEIVED NO GROWTH TO DATE CULTURE WILL BE HELD FOR 5 DAYS BEFORE ISSUING A  FINAL NEGATIVE REPORT   Report Status PENDING   Incomplete        On-Call/Text Page:      [email or web address]      password [email or web address]  If 7PM-7AM, please contact night-coverage [email or web address] Password [email or web address] 08/27/2012, 7:54 AM   LOS: 9 days   I have examined the patient, reviewed the chart and modified the above note which I agree with.  Zannie Cove, MD [phone]

## 2012-08-27 NOTE — Progress Notes (Signed)
Inpatient Diabetes Program Recommendations  AACE/ADA: New Consensus Statement on Inpatient Glycemic Control (2013)  Target Ranges:  Prepandial:   less than 140 mg/dL      Peak postprandial:   less than 180 mg/dL (1-2 hours)      Critically ill patients:  140 - 180 mg/dL   Reason for Visit: Hyperglycemia  Transferred to Inpt Rehab.  On Dys1 diet.  CBGs still remain in 200-300s.  Levemir increased to 20 units QDAC.  Would probably benefit from addition of meal coverage insulin.  Results for AUDELIA, LESSO (MRN [id number]) as of 08/27/2012 16:28  Ref. Range 08/26/2012 11:58 08/26/2012 16:09 08/27/2012 00:21 08/27/2012 06:48 08/27/2012 11:30  Glucose-Capillary Latest Range: 70-99 mg/dL 578 (H) 91 469 (H) 629 (H) 201 (H)  Results for DANNELY, HENDERSON (MRN [id number]) as of 08/27/2012 16:28  Ref. Range 08/19/2012 04:45  Hemoglobin A1C Latest Range: <5.7 % 7.4 (H)    Consider adding Novolog 3 units tidwc if pt consumes >50% meal.  Will follow.

## 2012-08-27 NOTE — Discharge Summary (Signed)
Physician Discharge Summary  Patient ID: Geana Walts MRN: [id number] DOB/AGE: December 14, 1940 71 y.o.  Admit date: 08/18/2012 Discharge date: 08/27/2012  Primary Care Physician:  Aida Puffer, MD   Discharge Diagnoses:    Principal Problem:  *Hemorrhage in the brain-13 x 22 x 14 mm right posterior temporo-occipital intra-axial acute Active Problems:  GERD (gastroesophageal reflux disease)  DM type 2 (diabetes mellitus, type 2)  Dysphagia  HTN (hypertension)  CKD (chronic kidney disease) stage 3, GFR 30-59 ml/min  Leukocytosis  Left hip pain  Left hemiparesis  Hemianopia, homonymous, left  Hemisensory deficit, left  Nausea  Headache  Encephalopathy acute  Acute respiratory failure with hypoxia  Seizure disorder, grand mal  Seizure      Medication List     As of 08/27/2012 11:09 AM    TAKE these medications         acetaminophen 325 MG tablet   Commonly known as: TYLENOL   Take 2 tablets (650 mg total) by mouth every 6 (six) hours as needed.      food thickener Powd   Commonly known as: THICK IT   As needed      insulin aspart 100 UNIT/ML injection   Commonly known as: novoLOG   0-9 Units, Subcutaneous, 3 times daily with meals  CBG < 70: implement hypoglycemia protocol, Call MD    CBG 70 - 120: 0 units,  CBG 121 - 150: 1 unit,  CBG 151 - 200: 2 units,  CBG 201 - 250: 3 units,  CBG 251 - 300: 5 units,  CBG 301 - 350: 7 units,  CBG 351 - 400: 9 units,  CBG > 400: call MD      insulin detemir 100 UNIT/ML injection   Commonly known as: LEVEMIR   Inject 15 Units into the skin daily with breakfast.      levETIRAcetam 250 MG tablet   Commonly known as: KEPPRA   Take 1 tablet (250 mg total) by mouth 2 (two) times daily.      metFORMIN 500 MG 24 hr tablet   Commonly known as: GLUCOPHAGE-XR   Take 500 mg by mouth daily with breakfast.      metoprolol tartrate 25 MG tablet   Commonly known as: LOPRESSOR   Take 2 tablets (50 mg total) by mouth 2  (two) times daily.      multivitamin with minerals Tabs   Take 1 tablet by mouth daily.      omeprazole 40 MG capsule   Commonly known as: PRILOSEC   Take 40 mg by mouth daily.      vitamin C 500 MG tablet   Commonly known as: ASCORBIC ACID   Take 500 mg by mouth daily.         Disposition and Follow-up:  PCP in 1 week Dr.Sethi in 1 month   Significant Diagnostic Studies:  Dg Swallowing Func-speech Pathology  08/25/2012  Breck Coons [place], CCC-SLP     08/25/2012  3:15 PM Objective Swallowing Evaluation: Modified Barium Swallowing Study   Patient Details  Name: Bobbye Petti MRN: [id number] Date of Birth: May 22, 1941  Today's Date: 08/25/2012 Time: 1350-1410 SLP Time Calculation (min): 20 min  Past Medical History:  Past Medical History  Diagnosis Date  . Diabetes mellitus   . GERD (gastroesophageal reflux disease)    Past Surgical History: History reviewed. No pertinent past  surgical history. HPI:  71 y/o with h/x of GERD, DM. Admitted to [facility] after MRI showed  subacute R. intraparenchymal bleed in  parietal area. CXR showed  mild left lower lobe airspace disease (could represent  atelectisis, raises concern for infection or less likely  aspiration). CT showed acute parenchymal hemorrhage in right  occipital lobe.  MBS was completed 08/24/12 with recommendation  of NPO.  RN contacted SLP reporting pt. is more alert and NGT is  "bothering" pt.  SLP spoke with pt. who does appear more alert  today.  SLP recommended repeat MBS with NGT removed prior to  study.      Assessment / Plan / Recommendation Clinical Impression  Dysphagia Diagnosis: Mild oral phase dysphagia;Moderate oral  phase dysphagia;Moderate pharyngeal phase dysphagia;Mild  pharyngeal phase dysphagia Clinical impression: MBS repeated today due to increased  alertness.  Mild-moderate improvements in oropharyngeal swallow  function compared to yesterday's study.  Mild-moderate oral and  pharyngeal phases of swallow are sensory and motor  based.  She  continues to exhibit reduced tongue base retraction and laryngeal  elevation resulting in mod-severe vallecular and mild-moderate  pyriform sinus residue with both nectar and honey consistencies.   Laryngeal penetration present during the swallow with nectar  thick (mild, over epiglottis but not deep into laryngeal  vestibule).  Pt. reflexively performs 3-4 multiple swallows  decreasing residue to mild (sometimes moderate).  SLP recommends  Dys1 diet and honey thick liquids via teaspoon, multiple  swallows, small bites, crushed pills.  Aspiration risk is  moderately high.  ST will follow for safety, efficiency and  continued pt/family education.    Treatment Recommendation  Therapy as outlined in treatment plan below    Diet Recommendation Dysphagia 1 (Puree);Honey-thick liquid   Liquid Administration via: No straw (LIQUID VIA TEASPOON) Medication Administration: Crushed with puree Supervision: Full supervision/cueing for compensatory strategies  (feeding assist) Compensations: Slow rate;Small sips/bites;Check for  pocketing;Multiple dry swallows after each bite/sip;Clear throat  intermittently Postural Changes and/or Swallow Maneuvers: Seated upright 90  degrees    Other  Recommendations Oral Care Recommendations: Oral care BID   Follow Up Recommendations  Inpatient Rehab    Frequency and Duration min 2x/week  2 weeks       SLP Swallow Goals Patient will consume recommended diet without observed clinical  signs of aspiration with: Minimal cueing Patient will utilize recommended strategies during swallow to  increase swallowing safety with: Minimal cueing      Reason for Referral Objectively evaluate swallowing function   Oral Phase Oral Preparation/Oral Phase Oral Phase: Impaired Oral - Honey Oral - Honey Teaspoon: Weak lingual manipulation Oral - Honey Cup: Weak lingual manipulation Oral - Nectar Oral - Nectar Teaspoon: Weak lingual manipulation Oral - Nectar Cup: Weak lingual manipulation Oral - Thin  Oral - Thin Teaspoon:  (decreased coordination)   Pharyngeal Phase Pharyngeal Phase Pharyngeal Phase: Impaired Pharyngeal - Honey Pharyngeal - Honey Teaspoon: Delayed swallow  initiation;Pharyngeal residue - valleculae;Pharyngeal residue -  pyriform sinuses;Reduced laryngeal elevation;Reduced tongue base  retraction;Premature spillage to valleculae Pharyngeal - Honey Cup: Delayed swallow initiation;Premature  spillage to valleculae;Reduced laryngeal elevation;Reduced tongue  base retraction;Pharyngeal residue - pyriform sinuses;Pharyngeal  residue - valleculae Pharyngeal - Nectar Pharyngeal - Nectar Teaspoon: Delayed swallow  initiation;Premature spillage to pyriform sinuses;Pharyngeal  residue - valleculae;Pharyngeal residue - pyriform  sinuses;Reduced tongue base retraction;Penetration/Aspiration  during swallow Penetration/Aspiration details (nectar teaspoon): Material enters  airway, remains ABOVE vocal cords then ejected out Pharyngeal - Nectar Cup: Premature spillage to pyriform  sinuses;Pharyngeal residue - pyriform sinuses;Pharyngeal residue  - valleculae;Reduced tongue base retraction;Reduced laryngeal  elevation;Delayed swallow initiation;Penetration/Aspiration  during swallow Penetration/Aspiration details (  nectar cup): Material enters  airway, remains ABOVE vocal cords and not ejected out Pharyngeal - Thin Pharyngeal - Thin Teaspoon: Delayed swallow initiation;Premature  spillage to pyriform sinuses;Pharyngeal residue -  valleculae;Pharyngeal residue - pyriform sinuses;Reduced  laryngeal elevation;Reduced tongue base  retraction;Penetration/Aspiration during swallow Penetration/Aspiration details (thin teaspoon): Material enters  airway, remains ABOVE vocal cords and not ejected out Pharyngeal - Solids Pharyngeal - Puree: Delayed swallow initiation;Premature spillage  to valleculae;Reduced laryngeal elevation;Reduced tongue base  retraction;Pharyngeal residue - pyriform sinuses;Pharyngeal  residue -  valleculae  Cervical Esophageal Phase    GO    Cervical Esophageal Phase Cervical Esophageal Phase: [facility]         [employer] [phone]  08/25/2012     Brief H and P: Teygan Breech is an 71 y.o. female with hx of DM, HTN, recent admission for hypoglycemia, frequent falls, lives at home with husband, brought into the ER after an MRI of her brain showed a area of subacute right intraparenchymal bleed in the parietal area. She was seen in the office with confusion and an outpatient MRI of the head was performed. She has a headache and has been unsteady on her gait. In the ER, further work up included a normal INR, normal platelet count, WBC of 11K and normal hemoglobin of 12.6. Neurologist was consulted and hospitalist was asked to admit her for intracranial bleed along with HTN.  Hospital Course:  Brief narrative:  *71 y/o female was admitted on 10/17 to [facility] with a subacute intracranial bleed noted on an outpatient MRI ordered for headache. Within 24 hours of admission she developed fever, seizure, and ongoing confusion. Resp failure lead to ET 10/18, Extubation 10/19.  Events Since Admission:  10/15 MRI brain 1.3x2.2x1.4cm R posterior temporoparietal intraparenchymal hemorrhage consistent with an event occuring one week ago  10/17 CT head R posterior temporoparietal intraparenchymal hemorrhage 1.3 x 2.1 cm  10/17 CT head R posterior temporoparietal intraparenchymal hemorrhage without significant change  10/18- resp failure, intubation emergent, fever  10/18 EEG showed moderately severe, continuous, nonspecific, generalized slowing of cerebral activity. This pattern of slowing can be seen with number of different etiologies including degenerative and metabolic encephalopathies as well as postictal state following generalized seizure. No evidence of ongoing seizure activity was seen.  10/19 EEG shows mild, generalized, nonspecific, continuous slowing of cerebral activity with  evidence of marked improvement compared to previous EEG study on August 19, 2012. No epileptiform discharges were recorded.  10/21 EEG showed moderately severe generalized slowing of cerebral activity as well as periodic lateralizing epileptiform discharges (PLED's). Lateralizing discharges on this type are most often seen following cerebral infarctions and are not typically associated with clinical seizure activity. This is a clear change from her EEG on November 13, 2011, which showed some mild generalized slowing.  10/23 CIR evaluation done, pt is appropriate for continued rehab at [facility], and has agreed to participate.   Assessment/Plan:  Principal Problem:  *Hemorrhage in the brain-13 x 22 x 14 mm right posterior temporo-occipital intra-axial acute.  * Neurology stroke service has signed off.  *Pt.To follow up with Dr. Pearlean Brownie at Stroke Clinic in 2 months.  * Plan to continue Keppra 250 mg BID per neurology suggestion  * If clinical seizures recur, do not increase Keppra, but add Vimpat per neurology.  * Ongoing SBP goal < 180   Active Problems:  * Encephalopathy acute:  * Secondary to Hemorrhage in the brain.  * Pt continues to improve.  *  Answering questions in short phrases today, following simple commands.  * Continue to avoid sedating drugs.  * Continue to follow for resolution.   * Presumed Aspiration Pneumonia:  * Appears to be resolved  * Continues to SaO2 100% on room air.  * No further antibiotic therapy.   * Seizures With Seizure Disorder:  * Continue Keppra 250 mg per Neuro advice.  * No further seizure activity noted.  * If seizures recur, start Vimpat per neuro.   * Hypokalemia:  * Potassium 3.2 today.  * K was 3.0 yesterday.  * Continue with supplemental Potassium 40 mEq BID today.  * Repeat BMET in am.   *Sinus Tachycardia:  * Pt. Remains tachycardic despite restarting home beta blocker regiment.  * Increase Dose of lopressor to 50 mg. BID.   * Chronic Kidney  Disease Stage 3 GFR 30-59 ml/min:  * BUN and Creatinine continue to trend down.  * Values today 14 and 1.14  * GFR today is 47.  * Continue to follow.   * Diabetes Mellitus II:  * CBG's remain high.  * Have modified patient's diet to a Carb Modified/Moderate to account for patient's diabetic history.  * will increase Levemir coverage.   * Dysphagia:  * Continue honey thick liquids and Dysphagia I Diet.  * Encourage po thickened liquids.  * Will re-evaluate as patients strength improves with follow-up swallow evaluation at later date.   * Hypertension:  * Systolic B/P's are trending up into 150 range.  * Will continue to monitor with SBP goal of <180 per neuro recommendations.   * Leukocytosis:  * Continues to trend down, back to normal now  DVT prophylaxis:  * SCD's   Code Status:  * Full   Family Communication:  * Spoke directly with patient   Disposition Plan:  *CIR when bed avialable      Time spent on Discharge:  Signed: Chaniqua Brisby [facility] Pager: [phone] 08/27/2012, 11:09 AM

## 2012-08-27 NOTE — Progress Notes (Signed)
Physical Therapy Treatment Patient Details Name: [name] [name] MRN: [id number] DOB: 1941-05-22 Today's Date: 08/27/2012 Time: 3086-5784 PT Time Calculation (min): 31 min  PT Assessment / Plan / Recommendation Comments on Treatment Session  Pt is s/p subacute Rt. intraparenchymal bleed in parietal area Pt more alert and able to participate. continues with poor balance and gait    Follow Up Recommendations  Post acute inpatient;Supervision/Assistance - 24 hour     Does the patient have the potential to tolerate intense rehabilitation  Yes, Recommend IP Rehab Screening  Barriers to Discharge        Equipment Recommendations  Other (comment) (TBD)    Recommendations for Other Services    Frequency Min 4X/week   Plan Discharge plan remains appropriate;Frequency remains appropriate    Precautions / Restrictions Precautions Precautions: Fall   Pertinent Vitals/Pain Denied pain    Mobility  Bed Mobility Bed Mobility: Rolling Right;Right Sidelying to Sit;Sitting - Scoot to [place] of Bed Rolling Right: 4: Min assist;With rail Right Sidelying to Sit: 4: Min assist;With rails;HOB flat Sitting - Scoot to [place] of Bed: 5: Supervision Details for Bed Mobility Assistance: vc for technique as pt trying to pull straight up from supine unsuccessfully; with cues to roll Rt and how to push up with UEs, pt able to complete task with min assist Transfers Transfers: Sit to Stand;Stand to Sit Sit to Stand: 4: Min guard;With upper extremity assist;With armrests;From bed;From chair/3-in-1 Stand to Sit: 4: Min guard;With upper extremity assist;With armrests;To chair/3-in-1 Details for Transfer Assistance: vc for safe hand placement/use of RW Ambulation/Gait Ambulation/Gait Assistance: 4: Min assist Ambulation Distance (Feet): 35 Feet Assistive device: Rolling walker Ambulation/Gait Assistance Details: Pt able to keep feet separated with verbal cues, however reverts to near scissoring when not cued.  Assist to direct RW (pt unable to stay on straight path) Gait Pattern: Step-through pattern;Decreased stride length;Right flexed knee in stance;Left flexed knee in stance;Trunk flexed;Narrow base of support Modified Rankin (Stroke Patients Only) Pre-Morbid Rankin Score: No symptoms Modified Rankin: Moderately severe disability    Exercises General Exercises - Lower Extremity Ankle Circles/Pumps: AROM;Both;10 reps;Seated Long Arc Quad: AAROM;Both;10 reps;Seated;Other (comment) (tight hamstrings)   PT Diagnosis:    PT Problem List:   PT Treatment Interventions:     PT Goals Acute Rehab PT Goals Pt will go Supine/Side to Sit: with modified independence PT Goal: Supine/Side to Sit - Progress: Progressing toward goal Pt will go Sit to Stand: with modified independence PT Goal: Sit to Stand - Progress: Progressing toward goal Pt will go Stand to Sit: with modified independence PT Goal: Stand to Sit - Progress: Progressing toward goal Pt will Ambulate: 51 - 150 feet;with supervision;with least restrictive assistive device PT Goal: Ambulate - Progress: Progressing toward goal  Visit Information  Last PT Received On: 08/27/12 Assistance Needed: +1    Subjective Data  Subjective: Pt discussed how she likes to hike to see [place]   Cognition  Overall Cognitive Status: Impaired Area of Impairment: Attention;Safety/judgement;Awareness of errors;Awareness of deficits Arousal/Alertness: Lethargic Orientation Level: Disoriented to;Time;Situation Behavior During Session: [facility] for tasks performed Current Attention Level: Sustained Following Commands: Follows one step commands consistently Safety/Judgement: Decreased awareness of need for assistance Awareness of Errors: Assistance required to identify errors made;Assistance required to correct errors made Awareness of Errors - Other Comments: unaware when her feet begin to scissor/cross when walking Awareness of  Deficits: unaware of deficits Cognition - Other Comments: pt attempting to stand without assist  Balance  Balance Balance Assessed: Yes Static Sitting Balance Static Sitting - Balance Support: No upper extremity supported;Feet supported Static Sitting - Level of Assistance: 5: Stand by assistance Static Sitting - Comment/# of Minutes: impulsive with come to stand Static Standing Balance Static Standing - Balance Support: Bilateral upper extremity supported Static Standing - Level of Assistance: 4: Min assist  End of Session PT - End of Session Equipment Utilized During Treatment: Gait belt Activity Tolerance: Patient tolerated treatment well Patient left: in chair;with call bell/phone within reach;with chair alarm set;with family/visitor present Nurse Communication: Other (comment) (up in chair with chair alarm)   GP     [name] [name] 08/27/2012, 11:07 AM Pager [phone]

## 2012-08-27 NOTE — H&P (Signed)
Physical Medicine and Rehabilitation Admission H&P    Chief Complaint  Patient presents with  . Headache and confusion due ot IPH  : HPI: [name] [name] is a 71 y.o. female with history of DM, fall 10 days PTA on 08/18/12 with worsening of HA and 3 day history of running into things on the left with gait problems. Was taking ASA q 6 hours for headaches.  MRI head done on outpatient basis with right posterior temporo-occipital hemorrhage and patient admitted for workup. Patient developed seizure on 10/17 pm and was loaded with dilantin and started on Keppra. EEG done revealing moderately severe, continuous nonspecific slowing of cerebral activity with question of encephalopathy v/s post ictal state. She did develop fever with hypotension and patient intubated on 10/18. CCM consulted for input and patient started on IV zosyn briefly for presumed aspiration PNA. Follow up EEG 10/19 with marked improvement. Serial CCT done showing stable appearance of bleed. Extubated on 10/19 and has been confused with bouts of agitation. keppra decreased to 250 mg bid on 10/21 due to lethargy.   Repeat EEG on 10/22 with moderately severe generalized slowing on 10/22 as well as PLED's-- felt to be most likely due to cerebral infarcts and not typically associated with clinical siezures. Neurology feels that patient with hemorrhage due to hypertension and excessive ASA use.  MBS done on 10/23 revealing mild to moderate oropharyngeal motor and sensory based dysphagia with residue and penetration on nectars--placed on D1, honey liquids by tsp. Confusion improving and agitation resolved.  Therapies ongoing and patient has had dizziness due to orthostasis  150/60-->88/50. Lopressor increased today due to resting tachycardia. Visual deficits noted with ADL tasks. Patient with unsteady gait with scissoring, needs extra time for processing and cues for safety. Therapy team recommending CIR for progression.  Review of Systems  HENT:  Negative for hearing loss.   Eyes: Positive for blurred vision (extremely poor vision per husband. ).  Respiratory: Negative for shortness of breath.   Cardiovascular: Negative for chest pain and palpitations.  Musculoskeletal: Negative for back pain and joint pain.  Neurological: Negative for headaches.   Past Medical History  Diagnosis Date  . Diabetes mellitus   . GERD (gastroesophageal reflux disease)    No past surgical history on file.  Family History  Problem Relation Age of Onset  . Cancer Mother   . Cancer Father    Social History: Married. Has been a homemaker. Did not need AD but has extremely poor vision--husband helped with CBG checks and meds administration. She reports that she has never smoked. She does not have any smokeless tobacco history on file. She reports that she does not drink alcohol or use illicit drugs.   Allergies  Allergen Reactions  . Barbiturates     Scheduled Meds:    . 0.9 % NaCl with KCl 20 mEq / L   Intravenous Custom  . antiseptic oral rinse  15 mL Mouth Rinse QID  . chlorhexidine  15 mL Mouth Rinse BID  . insulin aspart  0-15 Units Subcutaneous TID WC  . insulin detemir  20 Units Subcutaneous Q breakfast  . levETIRAcetam  250 mg Oral BID  . metoprolol tartrate  50 mg Oral BID  . multivitamin with minerals  1 tablet Oral Daily  . pantoprazole sodium  40 mg Oral Daily  . pneumococcal 23 valent vaccine  0.5 mL Intramuscular Tomorrow-1000  . potassium chloride  20 mEq Oral Once  . vitamin C  500 mg Oral Daily  Medications Prior to Admission  Medication Sig Dispense Refill  . acetaminophen (TYLENOL) 325 MG tablet Take 2 tablets (650 mg total) by mouth every 6 (six) hours as needed.      . food thickener (THICK IT) POWD As needed      . insulin aspart (NOVOLOG) 100 UNIT/ML injection 0-9 Units, Subcutaneous, 3 times daily with meals CBG < 70: implement hypoglycemia protocol, Call MD  CBG 70 - 120: 0 units, CBG 121 - 150: 1  unit, CBG 151 - 200: 2 units, CBG 201 - 250: 3 units, CBG 251 - 300: 5 units, CBG 301 - 350: 7 units, CBG 351 - 400: 9 units, CBG > 400: call MD  1 vial  0  . insulin detemir (LEVEMIR) 100 UNIT/ML injection Inject 15 Units into the skin daily with breakfast.      . levETIRAcetam (KEPPRA) 250 MG tablet Take 1 tablet (250 mg total) by mouth 2 (two) times daily.      . metFORMIN (GLUCOPHAGE-XR) 500 MG 24 hr tablet Take 500 mg by mouth daily with breakfast.      . metoprolol tartrate (LOPRESSOR) 25 MG tablet Take 2 tablets (50 mg total) by mouth 2 (two) times daily.      . Multiple Vitamin (MULTIVITAMIN WITH MINERALS) TABS Take 1 tablet by mouth daily.      Marland Kitchen omeprazole (PRILOSEC) 40 MG capsule Take 40 mg by mouth daily.      . vitamin C (ASCORBIC ACID) 500 MG tablet Take 500 mg by mouth daily.        Home:     Functional History:    Functional Status:  Mobility:          ADL:    Cognition: Cognition Orientation Level: Oriented to person;Oriented to time (fluctuates; oriented x 3)     Blood pressure 130/54, pulse 86, temperature 100.4 F (38 C), temperature source Oral, resp. rate 16, SpO2 97.00%. Physical Exam  Constitutional: She is oriented to person, place, and time. She appears well-developed.       Appears cachectic  HENT:  Head: Normocephalic and atraumatic.  Eyes:       Decreased pupillary reflex on the left.   Neck: Normal range of motion.  Cardiovascular: Normal rate and regular rhythm.   Pulmonary/Chest: Effort normal and breath sounds normal.  Abdominal: Soft. Bowel sounds are normal.  Musculoskeletal: She exhibits no edema and no tenderness.  Neurological: She is alert and oriented to person, place, and time. A cranial nerve deficit is present.       Fatigued past recent therapy session.  Needed some redirection to follow basic commads. Mild dysarthria in part due to fatigue.  Left facial weakness. Left inattention with visual deficits in lateral and  inferior fields.  Does not track well with left eye.  Left sided motor apraxia with left pronater drift. Answers biographic questions without difficulty. Decreased awareness of deficits.   Skin: Skin is warm and dry.  Psychiatric: She is slowed.  Sensation is intact to light touch in both upper and lower extremities Motor strength is 4 minus/5 in bilateral deltoid, biceps, triceps, grip, hip flexors, knee extensors, ankle dorsiflexors and plantar flexors GLUCOSE, CAPILLARY     Status: Abnormal   Collection Time   08/26/12 11:58 AM      Component Value Range Comment   Glucose-Capillary 306 (*) 70 - 99 mg/dL    Comment 1 Documented in Chart      Comment 2 Notify RN  GLUCOSE, CAPILLARY     Status: Normal   Collection Time   08/26/12  4:09 PM      Component Value Range Comment   Glucose-Capillary 91  70 - 99 mg/dL   GLUCOSE, CAPILLARY     Status: Abnormal   Collection Time   08/27/12 12:21 AM      Component Value Range Comment   Glucose-Capillary 343 (*) 70 - 99 mg/dL   BASIC METABOLIC PANEL     Status: Abnormal   Collection Time   08/27/12  6:05 AM      Component Value Range Comment   Sodium 142  135 - 145 mEq/L    Potassium 3.3 (*) 3.5 - 5.1 mEq/L    Chloride 103  96 - 112 mEq/L    CO2 29  19 - 32 mEq/L    Glucose, Bld 306 (*) 70 - 99 mg/dL    BUN 17  6 - 23 mg/dL    Creatinine, Ser 4.54  0.50 - 1.10 mg/dL    Calcium 8.3 (*) 8.4 - 10.5 mg/dL    GFR calc non Af Amer 53 (*) >90 mL/min    GFR calc Af Amer 62 (*) >90 mL/min   CBC     Status: Abnormal   Collection Time   08/27/12  6:05 AM      Component Value Range Comment   WBC 10.5  4.0 - 10.5 K/uL    RBC 3.11 (*) 3.87 - 5.11 MIL/uL    Hemoglobin 9.7 (*) 12.0 - 15.0 g/dL    HCT 09.8 (*) 11.9 - 46.0 %    MCV 95.8  78.0 - 100.0 fL    MCH 31.2  26.0 - 34.0 pg    MCHC 32.6  30.0 - 36.0 g/dL    RDW 14.7  82.9 - 56.2 %    Platelets 281  150 - 400 K/uL   GLUCOSE, CAPILLARY     Status: Abnormal   Collection Time   08/27/12   6:48 AM      Component Value Range Comment   Glucose-Capillary 277 (*) 70 - 99 mg/dL    Comment 1 Documented in Chart      Comment 2 Notify RN        Post Admission Physician Evaluation: 1. 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 2. Patient is admitted to receive collaborative, interdisciplinary care between the physiatrist, rehab nursing staff, and therapy team. 3. Patient's level of medical complexity and substantial therapy needs in context of that medical necessity cannot be provided at a lesser intensity of care such as a SNF. 4. Patient has experienced substantial functional loss from his/her baseline which was documented above under the "Functional History" and "Functional Status" headings.  Judging by the patient's diagnosis, physical exam, and functional history, the patient has potential for functional progress which will result in measurable gains while on inpatient rehab.  These gains will be of substantial and practical use upon discharge  in facilitating mobility and self-care at the household level. 5. Physiatrist will provide 24 hour management of medical needs as well as oversight of the therapy plan/treatment and provide guidance as appropriate regarding the interaction of the two. 6. 24 hour rehab nursing will assist with bladder management, bowel management, safety, skin/wound care, disease management, medication administration and patient education  and help integrate therapy concepts, techniques,education, etc. 7. PT will assess and  treat for:  Pre-gait training gait training endurance safety awareness equipment.  Goals are: Supervision with mobility. 8. OT will assess and treat for: ADLs, cognitive perceptual skills, neuromuscular reeducation, safety, endurance, equipment.   Goals are: Supervision ADLs. 9. SLP will assess and  treat for: Language, cognition, swallowing.  Goals are: Increase attention to the left side. 10. Case Management and Social Worker will assess and treat for psychological issues and discharge planning. 11. Team conference will be held weekly to assess progress toward goals and to determine barriers to discharge. 12. Patient will receive at least 3 hours of therapy per day at least 5 days per week. 13. ELOS: 10-14 days      Prognosis:  fair   Medical Problem List and Plan: 1. DVT Prophylaxis/Anticoagulation: Mechanical: Sequential compression devices, below knee Bilateral lower extremities 2. Pain Management:  N/A 3. Mood: difficult to judge at this time due to impaired cognition and poor awareness.  4. Neuropsych: This patient is not capable of making decisions on his/her own behalf. 5. DM type 2 insulin dependent :  BS have been poorly controlled.  Continue detemir with meal coverage and SSI. Monitor CBG AC/HS checks.  6. Orthostasis: will check ortho static BP in am and set parameters for BP meds. 7. New onset seizures:  Continue keppra 250 mg bid. Monitor for recurrence.  8. Resting tachycardia: likely due to deconditioning.  Will monitor with bid checks and patient symptoms.  9. Hypokalemia: likely dilutional due to IVF supplement and recheck 10/28. 10. ABLA: continue to monitor for trends. Recheck 10/28. 11. Reactive leucocytosis due to aspiration PNA: Resolving.  BC/UC negative.  Check follow up labs on 10/28. 12. Dysphagia:  Will nocturnal IVF. Doubt that patient will be able to maintain adequate hydration on honey liquids by tsp.  Push po fluids as able.   08/27/2012, 7:27 PM

## 2012-08-28 ENCOUNTER — Inpatient Hospital Stay (HOSPITAL_COMMUNITY): Payer: Medicare Other

## 2012-08-28 ENCOUNTER — Inpatient Hospital Stay (HOSPITAL_COMMUNITY): Payer: Federal, State, Local not specified - PPO | Admitting: *Deleted

## 2012-08-28 ENCOUNTER — Inpatient Hospital Stay (HOSPITAL_COMMUNITY): Payer: Medicare Other | Admitting: Speech Pathology

## 2012-08-28 ENCOUNTER — Inpatient Hospital Stay (HOSPITAL_COMMUNITY): Payer: Medicare Other | Admitting: Occupational Therapy

## 2012-08-28 ENCOUNTER — Inpatient Hospital Stay (HOSPITAL_COMMUNITY): Payer: Medicare Other | Admitting: Physical Therapy

## 2012-08-28 DIAGNOSIS — I619 Nontraumatic intracerebral hemorrhage, unspecified: Secondary | ICD-10-CM

## 2012-08-28 DIAGNOSIS — R569 Unspecified convulsions: Secondary | ICD-10-CM

## 2012-08-28 LAB — GLUCOSE, CAPILLARY
Glucose-Capillary: 31 mg/dL — CL (ref 70–99)
Glucose-Capillary: 162 mg/dL — ABNORMAL HIGH (ref 70–99)
Glucose-Capillary: 274 mg/dL — ABNORMAL HIGH (ref 70–99)

## 2012-08-28 LAB — URINE CULTURE

## 2012-08-28 MED ORDER — DEXTROSE 50 % IV SOLN
INTRAVENOUS | Status: AC
  Filled 2012-08-28: qty 50

## 2012-08-28 MED ORDER — INSULIN DETEMIR 100 UNIT/ML ~~LOC~~ SOLN
16.0000 [IU] | Freq: Every day | SUBCUTANEOUS | Status: DC
  Administered 2012-08-29 – 2012-09-10 (×13): 16 [IU] via SUBCUTANEOUS
  Filled 2012-08-28: qty 10

## 2012-08-28 MED ORDER — DEXTROSE 50 % IV SOLN: 25.0000 mL | Freq: Once | INTRAVENOUS | Status: AC | PRN

## 2012-08-28 MED ORDER — MOXIFLOXACIN HCL 400 MG PO TABS
400.0000 mg | ORAL_TABLET | Freq: Every day | ORAL | Status: DC
  Administered 2012-08-28 – 2012-09-02 (×6): 400 mg via ORAL
  Filled 2012-08-28 (×7): qty 1

## 2012-08-28 MED ORDER — GLUCOSE 40 % PO GEL
ORAL | Status: AC
  Filled 2012-08-28: qty 1

## 2012-08-28 NOTE — Plan of Care (Addendum)
Overall Plan of Care [facility]) Patient Details Name: [name] [name] MRN: [id number] DOB: 1941-10-09  Diagnosis:  Rehabilitation for ICH  Primary Diagnosis:    R temporo occipital ICH Co-morbidities: Diabetes mellitus  .  GERD (gastroesophageal reflux disease   Functional Problem List  Patient demonstrates impairments in the following areas: Balance, Bladder, Bowel, Cognition, Endurance, Medication Management, Motor, Pain, Perception and Vision, Dysphagia  Basic ADL's: grooming, bathing, dressing and toileting Advanced ADL's: simple meal preparation  Transfers:  bed mobility, bed to chair, toilet, tub/shower and car Locomotion:  ambulation and stairs  Additional Impairments:  Swallowing and Social Cognition   problem solving, memory, attention and awareness  Anticipated Outcomes Item Anticipated Outcome  Eating/Swallowing  Min assist-supervision  Basic self-care    Tolieting    Bowel/Bladder  Min assist  Transfers  Supervision  Locomotion  Supervision overall, min A stairs  Communication    Cognition  Min assist  Pain  < = 2  Safety/Judgment  No unsafe behavior  Other     Therapy Plan: PT Frequency: 1-2 X/day, 60-90 minutes OT Frequency: 1-2 X/day, 60-90 minutes SLP Frequency: 1-2 X/day, 30-60 minutes;5 out of 7 days   Team Interventions: Item RN PT OT SLP SW TR Other  Self Care/Advanced ADL Retraining         Neuromuscular Re-Education  x       Therapeutic Activities  x  x     UE/LE Strength Training/ROM  x       UE/LE Coordination Activities  x       Visual/Perceptual Remediation/Compensation  x       DME/Adaptive Equipment Instruction  x       Therapeutic Exercise  x       Balance/Vestibular Training  x       Patient/Family Education x x  x     Cognitive Remediation/Compensation  x  x     Functional Mobility Training  x       Ambulation/Gait Training  x       Programmer, systems Reintegration  x       Dysphagia/Aspiration Printmaker    x     Speech/Language Facilitation    x     Bladder Management x        Bowel Management x        Disease Management/Prevention x        Pain Management x        Medication Management x        Skin Care/Wound Management x        Splinting/Orthotics         Discharge Planning x x  x     Psychosocial Support x                           Team Discharge Planning: Destination:  Home Projected Follow-up:  PT, SLP and Home Health Projected Equipment Needs:  Walker Patient/family involved in discharge planning:  Yes  MD ELOS: 10-14 days Medical Rehab Prognosis:  Good Assessment: 71 yo female with R Temporo- occipital ICH now requiring 24/7 rehab MD/RN, CIR level PT,OT

## 2012-08-28 NOTE — Progress Notes (Signed)
Occupational Therapy Session Note  Patient Details  Name: [name] [name] MRN: [id number] Date of Birth: 1941-02-09  Today's Date: 08/28/2012 Time: 1100-1200 Time Calculation (min): 60 min  Short Term Goals: Week 1:  OT Short Term Goal 1 (Week 1): Pt will complete LB dressing with min assist  OT Short Term Goal 2 (Week 1): Pt will complete grooming in standing with min assist OT Short Term Goal 3 (Week 1): Pt will complete toilet transfer with min assist with min cues for sequencing OT Short Term Goal 4 (Week 1): Pt will complete toileting (hygiene and clothing management) with min/steady assist.  Skilled Therapeutic Interventions/Progress Updates:  OT eval initiated, ADL assessment conducted at EOB with focus on sit <> stand, stand pivot transfers, and self-care tasks of bathing, dressing, and self-feeding.  Pt demonstrated visual deficits, with decreased scanning to Lt and undershooting.  Pt required increased time for processing and verbal and tactile cues for sequencing with stand pivot transfer.  Pt will most likely benefit from short, simple instructions to increase success with transfers and novel activities.  Therapy Documentation Precautions:  Precautions Precautions: Fall Restrictions Weight Bearing Restrictions: No General: General Chart Reviewed: Yes Family/Caregiver Present: Yes Vital Signs:   Pain: Pain Assessment Pain Assessment: No/denies pain Pain Intervention(s): Medication (See eMAR) ADL: ADL Eating: Set up;Minimal cueing Where Assessed-Eating: Edge of bed Grooming: Minimal assistance Where Assessed-Grooming: Edge of bed Upper Body Bathing: Minimal cueing;Minimal assistance Where Assessed-Upper Body Bathing: Edge of bed Lower Body Bathing: Moderate assistance Where Assessed-Lower Body Bathing: Edge of bed Upper Body Dressing: Supervision/safety;Setup Where Assessed-Upper Body Dressing: Edge of bed Lower Body Dressing: Moderate assistance Where  Assessed-Lower Body Dressing: Edge of bed Toilet Transfer: Maximal assistance Toilet Transfer Method: Stand pivot Toilet Transfer Equipment: Bedside commode ADL Comments: Pt with severe visual disturbance, effecting ability to complete self-care tasks   See FIM for current functional status  Therapy/Group: Individual Therapy  Leonette Monarch 08/28/2012, 12:04 PM

## 2012-08-28 NOTE — Progress Notes (Signed)
Patient indicated a need to urinate at 2300, voided 200 cc on BSC. PVR scan for >596. Explained to patient a need to empty bladder and patient verbalized understanding. I/O cath patient for 750 cc at 2315. Urine color was cloudy appearance with minimal sediment noted and malodorous. Patient also noted for disorientation to place and time. Reoriented patient to surroundings. Noted UA C/S was done on 10/25 and pending culture results. Will continue to monitor patient status.

## 2012-08-28 NOTE — Evaluation (Signed)
Speech Language Pathology Assessment and Plan  Patient Details  Name: [name] [name] MRN: [id number] Date of Birth: 02-Jan-1941  SLP Diagnosis: Cognitive Impairments;Dysphagia  Rehab Potential: Good ELOS: 10-12 days   Today's Date: 08/28/2012 Time: 0900-1000 Time Calculation (min): 60 min  Problem List:  Patient Active Problem List  Diagnosis  . GERD (gastroesophageal reflux disease)  . DM type 2 (diabetes mellitus, type 2)  . Hypoglycemia due to insulin  . Hemorrhage of brain, nontraumatic  . Dysphagia  . HTN (hypertension)  . CKD (chronic kidney disease) stage 3, GFR 30-59 ml/min  . Leukocytosis  . Hemorrhage in the brain-13 x 22 x 14 mm right posterior temporo-occipital intra-axial acute  . Left hip pain  . Left hemiparesis  . Hemianopia, homonymous, left  . Hemisensory deficit, left  . Nausea  . Headache  . Encephalopathy acute  . Acute respiratory failure with hypoxia  . Seizure disorder, grand mal  . Seizure   Past Medical History:  Past Medical History  Diagnosis Date  . Diabetes mellitus   . GERD (gastroesophageal reflux disease)    Past Surgical History: No past surgical history on file.  Assessment / Plan / Recommendation Clinical Impression  [name] [name] is a 71 y.o. female with history of DM, fall 10 days PTA on 08/18/12 with worsening of HA and 3 day history of running into things on the left with gait problems. Was taking ASA q 6 hours for headaches. MRI head done on outpatient basis with right posterior temporo-occipital hemorrhage and patient admitted for workup. Patient developed seizure on 10/17 pm and was loaded with dilantin and started on Keppra. EEG done revealing moderately severe, continuous nonspecific slowing of cerebral activity with question of encephalopathy v/s post ictal state. She did develop fever with hypotension and patient intubated on 10/18. CCM consulted for input and patient started on IV zosyn briefly for presumed aspiration PNA. Follow  up EEG 10/19 with marked improvement. Serial CCT done showing stable appearance of bleed. Extubated on 10/19 and has been confused with bouts of agitation. keppra decreased to 250 mg bid on 10/21 due to lethargy.  Repeat EEG on 10/22 with moderately severe generalized slowing on 10/22 as well as PLED's-- felt to be most likely due to cerebral infarcts and not typically associated with clinical seizures. Neurology feels that patient with hemorrhage due to hypertension and excessive ASA use. MBS done on 10/23 revealing mild to moderate oropharyngeal motor and sensory based dysphagia with residue and penetration on nectars--placed on D1, honey liquids by tsp. Confusion improving and agitation resolved. Therapies ongoing and patient has had dizziness due to orthostasis 150/60-->88/50. Lopressor increased today due to resting tachycardia. Visual deficits noted with ADL tasks. Patient with unsteady gait with scissoring, needs extra time for processing and cues for safety. Therapy team recommending CIR for progression.  Patient admitted to [facility] 08/27/12.  SLP evaluation revealed mild-moderate motor and sensory pharyngeal dysphagia with intermittent wet vocal quality, delayed throat clear on liquids, and multiple swallows while consuming Dys. 1 textures and honey-thick liquids via teaspoon.  SLP suspects dysphagia is due to recent intubation, and as a result, SLP recommends f/u objective swallow study next week to determine presence of aspiration/penetration post-extubation.  Patient also exhibits moderate cognitive deficits.  Patient required mod assist cues to facilitate increased recall of new information and demonstrated awareness impairments with mod-max assist verbal and tactile cues to stay in chair while waiting for nursing to go to the bathroom.  Patient also required mod assist  verbal cues for basic problem solving during familiar tasks.   Overall, patient would benefit from skilled SLP services for cognition and  dysphagia management during CIR stay in order to maximize functional independence and reduce burden of care upon discharge.      SLP Assessment  Patient will need skilled Speech Lanaguage Pathology Services during CIR admission    Recommendations  Follow up Recommendations: 24 hour supervision/assistance;Home Health SLP Equipment Recommended: None recommended by SLP    SLP Frequency 1-2 X/day, 30-60 minutes;5 out of 7 days   SLP Treatment/Interventions Cognitive remediation/compensation;Cueing hierarchy;Dysphagia/aspiration precaution training;Environmental controls;Functional tasks;Internal/external aids;Patient/family education;Therapeutic Activities    Pain Pain Assessment Pain Assessment: No/denies pain Pain Intervention(s): Medication (See eMAR) Prior Functioning Type of Home: House Lives With: Spouse;Daughter Available Help at Discharge: Family Vocation: Retired  Teacher, music Term Goals: Week 1: SLP Short Term Goal 1 (Week 1): Patient will tolerate Dys. 1 textures and honey thick liquids via teaspoon with mod assist verbal cues for use of compensatory strategies. SLP Short Term Goal 2 (Week 1): Patient will tolerate an objective swallow study to determine presence of aspiration/penetration with min assist.   SLP Short Term Goal 3 (Week 1): Patient will sustain attention for 5 minutes during a functional task with mod assist verbal cues for redirection.   SLP Short Term Goal 4 (Week 1): Patient will utilize compensatory strategies to facilitate increased recall of new information with mod-max assist verbal cues.   SLP Short Term Goal 5 (Week 1): Patient will solve basic problems during a familiar task with mod assist verbal cues.  SLP Short Term Goal 6 (Week 1): Patient will demonstrate improved intellectual awareness by identifying 2 new deficits that occurred as a result of CVA.  See FIM for current functional status Refer to Care Plan for Long Term Goals  Recommendations for other  services: None  Discharge Criteria: Patient will be discharged from SLP if patient refuses treatment 3 consecutive times without medical reason, if treatment goals not met, if there is a change in medical status, if patient makes no progress towards goals or if patient is discharged from hospital.  The above assessment, treatment plan, treatment alternatives and goals were discussed and mutually agreed upon: by patient and by family  Ace Gins  Graduate Clinician Speech Language Pathology   Aly Seidenberg, Joni Reining 08/28/2012, 2:33 PM

## 2012-08-28 NOTE — Evaluation (Signed)
The assessment and plan has been reviewed and SLP is in agreement. Eric Morganti, M.A., CCC-SLP [phone]  

## 2012-08-28 NOTE — Progress Notes (Signed)
Hypoglycemic Event  CBG: 31 @1700   Treatment: D50 IV 25 mL  Symptoms: Sweaty  Follow-up CBG: Time:1735 CBG Result162  Possible Reasons for Event: Inadequate meal intake  Comments/MD notified:dr. kirstein    [name] [name]  Remember to initiate Hypoglycemia Order Set & complete

## 2012-08-28 NOTE — Evaluation (Signed)
Physical Therapy Assessment and Plan  Patient Details  Name: [name] [name] MRN: [id number] Date of Birth: November 28, 1940  PT Diagnosis: Abnormality of gait, Dizziness and giddiness and Muscle weakness Rehab Potential: Good ELOS: 7-10 days  Today's Date: 08/28/2012 Time: 1100-1200 Time Calculation (min): 60 min  Problem List:  Patient Active Problem List  Diagnosis  . GERD (gastroesophageal reflux disease)  . DM type 2 (diabetes mellitus, type 2)  . Hypoglycemia due to insulin  . Hemorrhage of brain, nontraumatic  . Dysphagia  . HTN (hypertension)  . CKD (chronic kidney disease) stage 3, GFR 30-59 ml/min  . Leukocytosis  . Hemorrhage in the brain-13 x 22 x 14 mm right posterior temporo-occipital intra-axial acute  . Left hip pain  . Left hemiparesis  . Hemianopia, homonymous, left  . Hemisensory deficit, left  . Nausea  . Headache  . Encephalopathy acute  . Acute respiratory failure with hypoxia  . Seizure disorder, grand mal  . Seizure    Past Medical History:  Past Medical History  Diagnosis Date  . Diabetes mellitus   . GERD (gastroesophageal reflux disease)    Past Surgical History: No past surgical history on file.  Assessment & Plan Clinical Impression: [name] [name] is a 71 y.o. female with history of DM, fall 10 days PTA on 08/18/12 with worsening of HA and 3 day history of running into things on the left with gait problems. Was taking ASA q 6 hours for headaches. MRI head done on outpatient basis with right posterior temporo-occipital hemorrhage and patient admitted for workup. Patient developed seizure on 10/17 pm and was loaded with dilantin and started on Keppra. EEG done revealing moderately severe, continuous nonspecific slowing of cerebral activity with question of encephalopathy v/s post ictal state. She did develop fever with hypotension and patient intubated on 10/18. CCM consulted for input and patient started on IV zosyn briefly for presumed aspiration PNA.  Follow up EEG 10/19 with marked improvement. Serial CCT done showing stable appearance of bleed. Extubated on 10/19 and has been confused with bouts of agitation. keppra decreased to 250 mg bid on 10/21 due to lethargy.  Repeat EEG on 10/22 with moderately severe generalized slowing on 10/22 as well as PLED's-- felt to be most likely due to cerebral infarcts and not typically associated with clinical siezures. Neurology feels that patient with hemorrhage due to hypertension and excessive ASA use. MBS done on 10/23 revealing mild to moderate oropharyngeal motor and sensory based dysphagia with residue and penetration on nectars--placed on D1, honey liquids by tsp. Confusion improving and agitation resolved. Therapies ongoing and patient has had dizziness due to orthostasis 150/60-->88/50. Lopressor increased today due to resting tachycardia. Visual deficits noted with ADL tasks. Patient with unsteady gait with scissoring, needs extra time for processing and cues for safety.   Patient transferred to CIR on 08/27/2012 .   Patient currently requires mod with mobility secondary to muscle weakness and muscle joint tightness and impaired timing and sequencing and decreased coordination.  Prior to hospitalization, patient was supervision with mobility and lived with [name] [name];[name] [name] (71 yo dtr, able bodied) in a House home.  Home access is 1 step at back, 3 steps in front (never use front entrance)Stairs to enter.  Patient will benefit from skilled PT intervention to maximize safe functional mobility, minimize fall risk and decrease caregiver burden for planned discharge home with 24 hour supervision.  Anticipate patient will not need PT follow up at discharge.  PT - End of Session Activity Tolerance:  Tolerates 10 - 20 min activity with multiple rests Endurance Deficit: Yes Endurance Deficit Description: fatigues quickly, lethargic PT Assessment Rehab Potential: Good Barriers to Discharge: None PT Plan PT  Frequency: 1-2 X/day, 60-90 minutes PT Recommendation Equipment Recommended: 3 in 1 bedside comode;Tub/shower bench  PT Evaluation Precautions/Restrictions Precautions Precautions: Fall Restrictions Weight Bearing Restrictions: No Pain Pain Assessment Pain Assessment: No/denies pain Pain Intervention(s): Medication (See eMAR) Home Living/Prior Functioning Home Living Lives With: [name] [name];[name] [name] (71 yo dtr, able bodied) Available Help at Discharge: Family Type of Home: House Home Access: Stairs to enter [employer] of Steps: 1 step at back, 3 steps in front (never use front entrance) Entrance Stairs-Rails: None Home Layout: One level Bathroom Shower/Tub: Engineer, manufacturing systems: Standard Bathroom Accessibility: Yes How Accessible: Accessible via walker Home Adaptive Equipment: None Prior Function Level of Independence: Independent with basic ADLs;Independent with homemaking with ambulation;Independent with gait Able to Take Stairs?: Yes Driving: No Vocation: Retired Leisure: Hobbies-yes (Comment) (bead work (before eye sight got bad)) Vision/Perception  Vision - History Baseline Vision: Wears glasses all the time Patient Visual Report: Undershooting;Unable to keep objects in focus Vision - Assessment Eye Alignment: Within Functional Limits Vision Assessment: Vision tested Ocular Range of Motion: Restricted on the right;Restricted on the left;Restricted looking up;Restricted looking down Alignment/Gaze Preference: Chin down Tracking/Visual Pursuits: Left eye does not track laterally;Decreased smoothness of vertical tracking;Requires cues, head turns, or add eye shifts to track;Unable to hold eye position out of midline  Cognition Overall Cognitive Status: Impaired Arousal/Alertness: Lethargic Orientation Level: Oriented to person;Oriented to place;Oriented to time Sensation Sensation Light Touch: Appears Intact Coordination Gross Motor Movements  are Fluid and Coordinated: No Fine Motor Movements are Fluid and Coordinated: No Finger Nose Finger Test: deficits secondary to visual impairments Heel Shin Test: impaired on Left LE due to weakness/tone Motor  Motor Motor: Hemiplegia (Left LE impaired with increased tone and decreased strength)  Mobility Bed Mobility Bed Mobility: Rolling Left;Left Sidelying to Sit;Sitting - Scoot to [place] of Bed;Scooting to [facility];Rolling Right Rolling Right: 5: Supervision;With rail Rolling Right Details: Verbal cues for technique Rolling Left: 5: Supervision;With rail Left Sidelying to Sit: 4: Min assist;With rails;HOB flat Supine to Sit: 4: Min assist;HOB flat Sitting - Scoot to Edge of Bed: 5: Supervision Sit to Supine: 4: Min assist;With rail;HOB flat Scooting to HOB: 3: Mod assist Scooting to [facility] Details: Verbal cues for technique;Manual facilitation for placement Transfers Sit to Stand: 4: Min assist;Without upper extremity assist;With armrests;From bed;From chair/3-in-1 Stand to Sit: 4: Min assist Stand to Sit Details (indicate cue type and reason): Verbal cues for technique;Manual facilitation for placement Stand Pivot Transfers: 2: Max assist Stand Pivot Transfer Details: Tactile cues for sequencing;Tactile cues for placement;Verbal cues for sequencing;Verbal cues for precautions/safety;Manual facilitation for weight shifting Locomotion  Ambulation Ambulation: Yes Ambulation/Gait Assistance: 4: Min assist Ambulation Distance (Feet): 35 Feet Assistive device: Rolling walker Ambulation/Gait Assistance Details: Verbal cues for technique;Verbal cues for gait pattern Ambulation/Gait Assistance Details: min-A to direct RW and verbal cues to widen BOS. Ambulates with flexed knees due to limited PROM knee extension  Gait Gait: Yes Gait Pattern: Impaired Gait Pattern: Step-through pattern;Decreased stride length;Right flexed knee in stance;Left flexed knee in stance;Trunk flexed;Narrow base of  support Stairs / Additional Locomotion Stairs: No Wheelchair Mobility Wheelchair Mobility: No  Trunk/Postural Assessment  Cervical Assessment Cervical Assessment: Within Functional Limits Thoracic Assessment Thoracic Assessment: Within Functional Limits Lumbar Assessment Lumbar Assessment: Within Functional Limits Postural Control Postural Control: Deficits on evaluation (maintains flexed  posture)  Balance Balance Balance Assessed: Yes Static Sitting Balance Static Sitting - Balance Support: Feet unsupported;No upper extremity supported Static Sitting - Level of Assistance: 5: Stand by assistance Static Standing Balance Static Standing - Balance Support: Bilateral upper extremity supported Static Standing - Level of Assistance: 4: Min assist Static Standing - Comment/# of Minutes: up x 1 minute with flexed posture (trunk, hips, knees) Dynamic Standing Balance Dynamic Standing - Balance Support: Bilateral upper extremity supported;During functional activity Dynamic Standing - Level of Assistance: 2: Max assist Dynamic Standing - Comments: patient needing verbal and tactile cues during gait to negotiate turns and maintain posture and safe positioning in RW Extremity Assessment  RUE Assessment RUE Assessment: Within Functional Limits (grossly 4/5) LUE Assessment LUE Assessment: Within Functional Limits (grossly 4/5) RLE Assessment RLE Assessment: Exceptions to [facility] (A/PROM R knee extension limited ~ 15 degrees) LLE Assessment LLE Assessment: Exceptions to [facility] (A/PROM L knee extension limited ~ 15 degrees)  See FIM for current functional status Refer to Care Plan for Long Term Goals  Recommendations for other services: None  Discharge Criteria: Patient will be discharged from PT if patient refuses treatment 3 consecutive times without medical reason, if treatment goals not met, if there is a change in medical status, if patient makes no progress towards goals or if patient is  discharged from hospital.  The above assessment, treatment plan, treatment alternatives and goals were discussed and mutually agreed upon: by patient  Rex Kras 08/28/2012, 11:50 AM

## 2012-08-28 NOTE — Evaluation (Signed)
Occupational Therapy Assessment and Plan  Patient Details  Name: [name] [name] MRN: [id number] Date of Birth: 08/12/1941  OT Diagnosis: abnormal posture, disturbance of vision and muscle weakness (generalized) Rehab Potential: Rehab Potential: Good ELOS: 10-12 days   Today's Date: 08/28/2012 Time: 0800-0900  Time Calculation (min): 60 min  Problem List:  Patient Active Problem List  Diagnosis  . GERD (gastroesophageal reflux disease)  . DM type 2 (diabetes mellitus, type 2)  . Hypoglycemia due to insulin  . Hemorrhage of brain, nontraumatic  . Dysphagia  . HTN (hypertension)  . CKD (chronic kidney disease) stage 3, GFR 30-59 ml/min  . Leukocytosis  . Hemorrhage in the brain-13 x 22 x 14 mm right posterior temporo-occipital intra-axial acute  . Left hip pain  . Left hemiparesis  . Hemianopia, homonymous, left  . Hemisensory deficit, left  . Nausea  . Headache  . Encephalopathy acute  . Acute respiratory failure with hypoxia  . Seizure disorder, grand mal  . Seizure    Past Medical History:  Past Medical History  Diagnosis Date  . Diabetes mellitus   . GERD (gastroesophageal reflux disease)    Past Surgical History: No past surgical history on file.  Assessment & Plan Clinical Impression: Patient is a 71 y.o. year old female with recent admission to the hospital with fall 10 days PTA on 08/18/12 with worsening of HA and 3 day history of running into things on the left with gait problems. Was taking ASA q 6 hours for headaches. MRI head done on outpatient basis with right posterior temporo-occipital hemorrhage and patient admitted for workup. Patient developed seizure on 10/17 pm and was loaded with dilantin and started on Keppra. EEG done revealing moderately severe, continuous nonspecific slowing of cerebral activity with question of encephalopathy v/s post ictal state. She did develop fever with hypotension and patient intubated on 10/18. CCM consulted for input and  patient started on IV zosyn briefly for presumed aspiration PNA. Follow up EEG 10/19 with marked improvement. Serial CCT done showing stable appearance of bleed. Extubated on 10/19 and has been confused with bouts of agitation. keppra decreased to 250 mg bid on 10/21 due to lethargy.   Repeat EEG on 10/22 with moderately severe generalized slowing on 10/22 as well as PLED's-- felt to be most likely due to cerebral infarcts and not typically associated with clinical siezures. Neurology feels that patient with hemorrhage due to hypertension and excessive ASA use. MBS done on 10/23 revealing mild to moderate oropharyngeal motor and sensory based dysphagia with residue and penetration on nectars--placed on D1, honey liquids by tsp. Confusion improving and agitation resolved. Therapies ongoing and patient has had dizziness due to orthostasis 150/60-->88/50. Lopressor increased today due to resting tachycardia. Visual deficits noted with ADL tasks. Patient with unsteady gait with scissoring, needs extra time for processing and cues for safety.   Patient transferred to CIR on 08/27/2012 .    Patient currently requires mod with basic self-care skills secondary to muscle weakness, impaired timing and sequencing and decreased motor planning, decreased visual acuity and decreased visual perceptual skills, decreased attention to left and decreased motor planning, decreased awareness, decreased problem solving, decreased safety awareness and delayed processing and decreased standing balance and decreased balance strategies.  Prior to hospitalization, patient could complete ADLs with independence.  Patient will benefit from skilled intervention to increase independence with basic self-care skills and increase level of independence with iADL prior to discharge home with care partner.  Anticipate patient will require intermittent  supervision and follow up home health.  OT - End of Session Activity Tolerance: Tolerates  30+ min activity without fatigue Endurance Deficit: Yes Endurance Deficit Description: fatigues quickly, lethargic OT Assessment Rehab Potential: Good Barriers to Discharge: None OT Plan OT Frequency: 1-2 X/day, 60-90 minutes Estimated Length of Stay: 10-12 days OT Treatment/Interventions: Discharge planning;Balance/vestibular training;DME/adaptive equipment instruction;Functional mobility training;Neuromuscular re-education;Patient/family education;Self Care/advanced ADL retraining;Psychosocial support;Therapeutic Activities;Therapeutic Exercise;UE/LE Coordination activities;UE/LE Strength taining/ROM;Visual/perceptual remediation/compensation OT Recommendation Follow Up Recommendations: Home health OT (TBD) Equipment Recommended: 3 in 1 bedside comode;Tub/shower bench  OT Evaluation Precautions/Restrictions  Precautions Precautions: Fall Restrictions Weight Bearing Restrictions: No General Chart Reviewed: Yes Family/Caregiver Present: Yes Vital Signs   Pain Pain Assessment Pain Assessment: No/denies pain Pain Intervention(s): Medication (See eMAR) Home Living/Prior Functioning Home Living Lives With: Spouse;Daughter (2 yo dtr, able bodied) Available Help at Discharge: Family Type of Home: House Home Access: Stairs to enter [employer] of Steps: 1 step at back, 3 steps in front (never use front entrance) Entrance Stairs-Rails: None Home Layout: One level Bathroom Shower/Tub: Engineer, manufacturing systems: Standard Bathroom Accessibility: Yes How Accessible: Accessible via walker Home Adaptive Equipment: None Prior Function Level of Independence: Independent with basic ADLs;Independent with homemaking with ambulation;Independent with gait Able to Take Stairs?: Yes Driving: No Vocation: Retired Leisure: Hobbies-yes (Comment) (bead work (before eye sight got bad)) ADL ADL Eating: Set up;Minimal cueing Where Assessed-Eating: Edge of bed Grooming:  Minimal assistance Where Assessed-Grooming: Edge of bed Upper Body Bathing: Minimal cueing;Minimal assistance Where Assessed-Upper Body Bathing: Edge of bed Lower Body Bathing: Moderate assistance Where Assessed-Lower Body Bathing: Edge of bed Upper Body Dressing: Supervision/safety;Setup Where Assessed-Upper Body Dressing: Edge of bed Lower Body Dressing: Moderate assistance Where Assessed-Lower Body Dressing: Edge of bed Toilet Transfer: Maximal assistance Toilet Transfer Method: Stand pivot Toilet Transfer Equipment: Bedside commode ADL Comments: Pt with severe visual disturbance, effecting ability to complete self-care tasks  Vision/Perception  Vision - History Baseline Vision: Wears glasses all the time Patient Visual Report: Undershooting;Unable to keep objects in focus Vision - Assessment Eye Alignment: Within Functional Limits Vision Assessment: Vision tested Ocular Range of Motion: Restricted on the right;Restricted on the left;Restricted looking up;Restricted looking down Alignment/Gaze Preference: Chin down Tracking/Visual Pursuits: Left eye does not track laterally;Decreased smoothness of vertical tracking;Requires cues, head turns, or add eye shifts to track;Unable to hold eye position out of midline  Cognition Overall Cognitive Status: Impaired Arousal/Alertness: Lethargic Orientation Level: Oriented to person;Oriented to place;Oriented to time Sensation Sensation Light Touch: Appears Intact Coordination Gross Motor Movements are Fluid and Coordinated: No Fine Motor Movements are Fluid and Coordinated: No Finger Nose Finger Test: deficits secondary to visual impairments Heel Shin Test: impaired on Left LE due to weakness/tone Motor  Motor Motor: Hemiplegia (Left LE impaired with increased tone and decreased strength) Mobility  Bed Mobility Bed Mobility: Rolling Left;Left Sidelying to Sit;Sitting - Scoot to [place] of Bed;Scooting to [facility];Rolling Right Rolling  Right: 5: Supervision;With rail Rolling Right Details: Verbal cues for technique Rolling Left: 5: Supervision;With rail Left Sidelying to Sit: 4: Min assist;With rails;HOB flat Supine to Sit: 4: Min assist;HOB flat Sitting - Scoot to Edge of Bed: 5: Supervision Sit to Supine: 4: Min assist;With rail;HOB flat Scooting to HOB: 3: Mod assist Scooting to [facility] Details: Verbal cues for technique;Manual facilitation for placement Transfers Sit to Stand: 4: Min assist;Without upper extremity assist;With armrests;From bed;From chair/3-in-1 Stand to Sit: 4: Min assist Stand to Sit Details (indicate cue type and reason): Verbal cues for technique;Manual  facilitation for placement  Trunk/Postural Assessment  Cervical Assessment Cervical Assessment: Within Functional Limits Thoracic Assessment Thoracic Assessment: Within Functional Limits Lumbar Assessment Lumbar Assessment: Within Functional Limits Postural Control Postural Control: Deficits on evaluation (maintains flexed posture)  Balance Balance Balance Assessed: Yes Static Sitting Balance Static Sitting - Balance Support: Feet unsupported;No upper extremity supported Static Sitting - Level of Assistance: 5: Stand by assistance Static Standing Balance Static Standing - Balance Support: Bilateral upper extremity supported Static Standing - Level of Assistance: 4: Min assist Static Standing - Comment/# of Minutes: up x 1 minute with flexed posture (trunk, hips, knees) Dynamic Standing Balance Dynamic Standing - Balance Support: Bilateral upper extremity supported;During functional activity Dynamic Standing - Level of Assistance: 2: Max assist Dynamic Standing - Comments: patient needing verbal and tactile cues during gait to negotiate turns and maintain posture and safe positioning in RW Extremity/Trunk Assessment RUE Assessment RUE Assessment: Within Functional Limits (grossly 4/5) LUE Assessment LUE Assessment: Within Functional Limits  (grossly 4/5)  See FIM for current functional status Refer to Care Plan for Long Term Goals  Recommendations for other services: None  Discharge Criteria: Patient will be discharged from OT if patient refuses treatment 3 consecutive times without medical reason, if treatment goals not met, if there is a change in medical status, if patient makes no progress towards goals or if patient is discharged from hospital.  The above assessment, treatment plan, treatment alternatives and goals were discussed and mutually agreed upon: by patient and by family  Leonette Monarch 08/28/2012, 12:00 PM

## 2012-08-28 NOTE — Progress Notes (Signed)
Patient ID: [name] [name], female   DOB: May 01, 1941, 71 y.o.   MRN: [id number]  Subjective/Complaints: [name] [name] is a 71 y.o. female with history of DM, fall 10 days PTA on 08/18/12 with worsening of HA and 3 day history of running into things on the left with gait problems. Was taking ASA q 6 hours for headaches. MRI head done on outpatient basis with right posterior temporo-occipital hemorrhage and patient admitted for workup. Patient developed seizure on 10/17 pm and was loaded with dilantin and started on Keppra. EEG done revealing moderately severe, continuous nonspecific slowing of cerebral activity with question of encephalopathy v/s post ictal state. She did develop fever with hypotension and patient intubated on 10/18. CCM consulted for input and patient started on IV zosyn briefly for presumed aspiration PNA. "I'm cold, cover me up" Review of Systems  Unable to perform ROS: mental status change    Objective: Vital Signs: Blood pressure 136/60, pulse 89, temperature 98 F (36.7 C), temperature source Oral, resp. rate 17, weight 53.2 kg (117 lb 4.6 oz), SpO2 99.00%. No results found. Results for orders placed during the hospital encounter of 08/27/12 (from the past 72 hour(s))  GLUCOSE, CAPILLARY     Status: Normal   Collection Time   08/27/12  4:33 PM      Component Value Range Comment   Glucose-Capillary 73  70 - 99 mg/dL    Comment 1 Notify RN     URINALYSIS, ROUTINE W REFLEX MICROSCOPIC     Status: Abnormal   Collection Time   08/27/12  6:29 PM      Component Value Range Comment   Color, Urine YELLOW  YELLOW    APPearance CLEAR  CLEAR    Specific Gravity, Urine 1.013  1.005 - 1.030    pH 7.5  5.0 - 8.0    Glucose, UA NEGATIVE  NEGATIVE mg/dL    Hgb urine dipstick SMALL (*) NEGATIVE    Bilirubin Urine NEGATIVE  NEGATIVE    Ketones, ur NEGATIVE  NEGATIVE mg/dL    Protein, ur 30 (*) NEGATIVE mg/dL    Urobilinogen, UA 0.2  0.0 - 1.0 mg/dL    Nitrite NEGATIVE  NEGATIVE    Leukocytes, UA NEGATIVE  NEGATIVE   URINE MICROSCOPIC-ADD ON     Status: Abnormal   Collection Time   08/27/12  6:29 PM      Component Value Range Comment   Squamous Epithelial / LPF RARE  RARE    RBC / HPF 3-6  <3 RBC/hpf    Casts HYALINE CASTS (*) NEGATIVE   GLUCOSE, CAPILLARY     Status: Abnormal   Collection Time   08/27/12  8:59 PM      Component Value Range Comment   Glucose-Capillary 204 (*) 70 - 99 mg/dL    Comment 1 Notify RN        HEENT: normal Cardio: RRR Resp: CTA B/L GI: BS positive Extremity:  Pulses positive Skin:   Intact Neuro: Lethargic, Confused, Cranial Nerve II-XII normal, Abnormal Sensory reduced on L but cannot do detailed testing due to cooperation, Normal Motor, Abnormal FMC Ataxic/ dec FMC, Inattention and Other L HH Musc/Skel:  Normal Oriented to person place and situation   Assessment/Plan: 1. Functional deficits secondary to R temporo-occipital ICH which require 3+ hours per day of interdisciplinary therapy in a comprehensive inpatient rehab setting. Physiatrist is providing close team supervision and 24 hour management of active medical problems listed below. Physiatrist and rehab team continue to assess barriers  to discharge/monitor patient progress toward functional and medical goals. FIM:                   Comprehension Comprehension Mode: Auditory Comprehension: 5-Follows basic conversation/direction: With no assist  Expression Expression Mode: Verbal Expression: 5-Expresses basic 90% of the time/requires cueing < 10% of the time.  Social Interaction Social Interaction: 2-Interacts appropriately 25 - 49% of time - Needs frequent redirection.  Problem Solving Problem Solving: 2-Solves basic 25 - 49% of the time - needs direction more than half the time to initiate, plan or complete simple activities  Memory Memory: 3-Recognizes or recalls 50 - 74% of the time/requires cueing 25 - 49% of the time   Medical Problem List and  Plan:  1. DVT Prophylaxis/Anticoagulation: Mechanical: Sequential compression devices, below knee Bilateral lower extremities  2. Pain Management: N/A  3. Mood: difficult to judge at this time due to impaired cognition and poor awareness.  4. Neuropsych: This patient is not capable of making decisions on his/her own behalf.  5. DM type 2 insulin dependent : BS have been poorly controlled. Continue detemir with meal coverage and SSI. Monitor CBG AC/HS checks.  6. Orthostasis: will check ortho static BP in am and set parameters for BP meds.  7. New onset seizures: Continue keppra 250 mg bid. Monitor for recurrence.  8. Resting tachycardia: likely due to deconditioning. Will monitor with bid checks and patient symptoms.  9. Hypokalemia: likely dilutional due to IVF supplement and recheck 10/28.  10. ABLA: continue to monitor for trends. Recheck 10/28.  11. Reactive leucocytosis due to aspiration PNA: Resolving. BC/UC negative. Check follow up labs on 10/28.  12. Dysphagia: Will nocturnal IVF. Doubt that patient will be able to maintain adequate hydration on honey liquids by tsp. Push po fluids as able   LOS (Days) 1 A FACE TO FACE EVALUATION WAS PERFORMED  KIRSTEINS,ANDREW E 08/28/2012, 7:38 AM

## 2012-08-29 ENCOUNTER — Inpatient Hospital Stay (HOSPITAL_COMMUNITY): Payer: Medicare Other | Admitting: *Deleted

## 2012-08-29 LAB — GLUCOSE, CAPILLARY
Glucose-Capillary: 438 mg/dL — ABNORMAL HIGH (ref 70–99)
Glucose-Capillary: 488 mg/dL — ABNORMAL HIGH (ref 70–99)
Glucose-Capillary: 172 mg/dL — ABNORMAL HIGH (ref 70–99)

## 2012-08-29 LAB — GLUCOSE, RANDOM: Glucose, Bld: 518 mg/dL — ABNORMAL HIGH (ref 70–99)

## 2012-08-29 MED ORDER — INSULIN ASPART 100 UNIT/ML ~~LOC~~ SOLN
15.0000 [IU] | Freq: Once | SUBCUTANEOUS | Status: AC
  Administered 2012-08-29: 15 [IU] via SUBCUTANEOUS

## 2012-08-29 MED ORDER — METFORMIN HCL 500 MG PO TABS
500.0000 mg | ORAL_TABLET | Freq: Two times a day (BID) | ORAL | Status: DC
  Administered 2012-08-29 – 2012-09-01 (×5): 500 mg via ORAL
  Filled 2012-08-29 (×10): qty 1

## 2012-08-29 MED ORDER — BIOTENE DRY MOUTH MT LIQD
15.0000 mL | Freq: Two times a day (BID) | OROMUCOSAL | Status: DC
  Administered 2012-08-30 – 2012-09-10 (×21): 15 mL via OROMUCOSAL

## 2012-08-29 NOTE — Progress Notes (Addendum)
OccupationalTherapy Note  Patient Details  Name: [name] [name] MRN: [id number] Date of Birth: 06/13/1941 Today's Date: 08/29/2012 Pain: Time:  1415-1500  (45 min) Individual session  Pt.transferred to toilet with min assist. Performed pericare and clothes with steadying assist.   She Exhibited decreased scanning to left.  Practiced scanning and reaching to left. Provided verbal and minimal physical cues to rotate and reach to left using right hand.  Pt. Found 6 out of 6 objects within 5 mins.     Humberto Seals 08/29/2012, 10:28 AM

## 2012-08-29 NOTE — Progress Notes (Signed)
Patient ID: [name] [name], female   DOB: 1941/10/11, 71 y.o.   MRN: [id number]  Subjective/Complaints: [name] is a 71 y.o. female with history of DM, fall 10 days PTA on 08/18/12 with worsening of HA and 3 day history of running into things on the left with gait problems. Was taking ASA q 6 hours for headaches. MRI head done on outpatient basis with right posterior temporo-occipital hemorrhage and patient admitted for workup. Patient developed seizure on 10/17 pm and was loaded with dilantin and started on Keppra. EEG done revealing moderately severe, continuous nonspecific slowing of cerebral activity with question of encephalopathy v/s post ictal state. She did develop fever with hypotension and patient intubated on 10/18. CCM consulted for input and patient started on IV zosyn briefly for presumed aspiration PNA.  Review of Systems  Unable to perform ROS: mental status change    Objective: Vital Signs: Blood pressure 127/52, pulse 75, temperature 98.3 F (36.8 C), temperature source Oral, resp. rate 18, weight 52.3 kg (115 lb 4.8 oz), SpO2 95.00%. Dg Chest 2 View  08/28/2012  *RADIOLOGY REPORT*  Clinical Data: 71 year old female with fever and recent pneumonia.  CHEST - 2 VIEW  Comparison: 08/24/2012 and earlier.  Findings: Confluent left lower lobe opacity appears new or increased from prior.  Enteric tube is been removed.  Stable lung volumes.  Stable cardiac size and mediastinal contours.  Stable biapical scarring.  No pulmonary edema.  No pneumothorax.  Right lung grossly clear. No acute osseous abnormality identified.  IMPRESSION: Confluent opacity at the left lung base is increased since 08/24/2012 and in this setting is compatible with pneumonia plus/minus small effusion.  Recommend post-treatment radiographs to document resolution.   Original Report Authenticated By: Harley Hallmark, M.D.    Results for orders placed during the hospital encounter of 08/27/12 (from the past 72 hour(s))    GLUCOSE, CAPILLARY     Status: Normal   Collection Time   08/27/12  4:33 PM      Component Value Range Comment   Glucose-Capillary 73  70 - 99 mg/dL    Comment 1 Notify RN     URINALYSIS, ROUTINE W REFLEX MICROSCOPIC     Status: Abnormal   Collection Time   08/27/12  6:29 PM      Component Value Range Comment   Color, Urine YELLOW  YELLOW    APPearance CLEAR  CLEAR    Specific Gravity, Urine 1.013  1.005 - 1.030    pH 7.5  5.0 - 8.0    Glucose, UA NEGATIVE  NEGATIVE mg/dL    Hgb urine dipstick SMALL (*) NEGATIVE    Bilirubin Urine NEGATIVE  NEGATIVE    Ketones, ur NEGATIVE  NEGATIVE mg/dL    Protein, ur 30 (*) NEGATIVE mg/dL    Urobilinogen, UA 0.2  0.0 - 1.0 mg/dL    Nitrite NEGATIVE  NEGATIVE    Leukocytes, UA NEGATIVE  NEGATIVE   URINE MICROSCOPIC-ADD ON     Status: Abnormal   Collection Time   08/27/12  6:29 PM      Component Value Range Comment   Squamous Epithelial / LPF RARE  RARE    RBC / HPF 3-6  <3 RBC/hpf    Casts HYALINE CASTS (*) NEGATIVE   URINE CULTURE     Status: Normal   Collection Time   08/27/12  6:30 PM      Component Value Range Comment   Specimen Description URINE, CLEAN CATCH      Special  Requests NONE      Culture  Setup Time 08/27/2012 19:12      Colony Count 5,000 COLONIES/ML      Culture INSIGNIFICANT GROWTH      Report Status 08/28/2012 FINAL     GLUCOSE, CAPILLARY     Status: Abnormal   Collection Time   08/27/12  8:59 PM      Component Value Range Comment   Glucose-Capillary 204 (*) 70 - 99 mg/dL    Comment 1 Notify RN     GLUCOSE, CAPILLARY     Status: Abnormal   Collection Time   08/28/12  7:58 AM      Component Value Range Comment   Glucose-Capillary 147 (*) 70 - 99 mg/dL    Comment 1 Notify RN     GLUCOSE, CAPILLARY     Status: Abnormal   Collection Time   08/28/12 12:11 PM      Component Value Range Comment   Glucose-Capillary 384 (*) 70 - 99 mg/dL    Comment 1 Notify RN     GLUCOSE, CAPILLARY     Status: Abnormal    Collection Time   08/28/12  5:08 PM      Component Value Range Comment   Glucose-Capillary 31 (*) 70 - 99 mg/dL    Comment 1 Notify RN     GLUCOSE, CAPILLARY     Status: Abnormal   Collection Time   08/28/12  5:34 PM      Component Value Range Comment   Glucose-Capillary 162 (*) 70 - 99 mg/dL    Comment 1 Notify RN     GLUCOSE, CAPILLARY     Status: Abnormal   Collection Time   08/28/12  9:17 PM      Component Value Range Comment   Glucose-Capillary 274 (*) 70 - 99 mg/dL    Comment 1 Notify RN        HEENT: normal Cardio: RRR Resp: CTA B/L GI: BS positive Extremity:  Pulses positive Skin:   Intact Neuro: Lethargic, Confused, Cranial Nerve II-XII normal, Abnormal Sensory reduced on L but cannot do detailed testing due to cooperation, Normal Motor, Abnormal FMC Ataxic/ dec FMC, Inattention and Other L HH Musc/Skel:  Normal Oriented to person place and situation   Assessment/Plan: 1. Functional deficits secondary to R temporo-occipital ICH which require 3+ hours per day of interdisciplinary therapy in a comprehensive inpatient rehab setting. Physiatrist is providing close team supervision and 24 hour management of active medical problems listed below. Physiatrist and rehab team continue to assess barriers to discharge/monitor patient progress toward functional and medical goals. FIM: FIM - Bathing Bathing Steps Patient Completed: Chest;Right Arm;Left Arm;Abdomen;Front perineal area;Right upper leg;Left upper leg Bathing: 3: Mod-Patient completes 5-7 26f 10 parts or 50-74%  FIM - Upper Body Dressing/Undressing Upper body dressing/undressing steps patient completed: Thread/unthread right sleeve of pullover shirt/dresss;Thread/unthread left sleeve of pullover shirt/dress;Put head through opening of pull over shirt/dress;Pull shirt over trunk Upper body dressing/undressing: 4: Steadying assist FIM - Lower Body Dressing/Undressing Lower body dressing/undressing steps patient  completed: Thread/unthread right underwear leg;Pull underwear up/down;Thread/unthread right pants leg;Pull pants up/down Lower body dressing/undressing: 3: Mod-Patient completed 50-74% of tasks     FIM - Diplomatic Services operational officer Devices: Psychiatrist Transfers: 4-To toilet/BSC: Min A (steadying Pt. > 75%);4-From toilet/BSC: Min A (steadying Pt. > 75%) (max cueing)  FIM - Banker Devices: Walker;Bed rails Bed/Chair Transfer: 4: Supine > Sit: Min A (steadying Pt. > 75%/lift  1 leg);4: Sit > Supine: Min A (steadying pt. > 75%/lift 1 leg);2: Bed > Chair or W/C: Max A (lift and lower assist);2: Chair or W/C > Bed: Max A (lift and lower assist)  FIM - Locomotion: Wheelchair Locomotion: Wheelchair: 0: Activity did not occur FIM - Locomotion: Ambulation Locomotion: Ambulation Assistive Devices: Designer, industrial/product Ambulation/Gait Assistance: 4: Min assist Locomotion: Ambulation: 1: Travels less than 50 ft with minimal assistance (Pt.>75%) (using rolling walker)  Comprehension Comprehension Mode: Auditory Comprehension: 5-Follows basic conversation/direction: With extra time/assistive device  Expression Expression Mode: Verbal Expression: 5-Expresses basic 90% of the time/requires cueing < 10% of the time.  Social Interaction Social Interaction: 4-Interacts appropriately 75 - 89% of the time - Needs redirection for appropriate language or to initiate interaction.  Problem Solving Problem Solving: 2-Solves basic 25 - 49% of the time - needs direction more than half the time to initiate, plan or complete simple activities  Memory Memory: 3-Recognizes or recalls 50 - 74% of the time/requires cueing 25 - 49% of the time   Medical Problem List and Plan:  1. DVT Prophylaxis/Anticoagulation: Mechanical: Sequential compression devices, below knee Bilateral lower extremities  2. Pain Management: N/A  3. Mood: difficult to judge  at this time due to impaired cognition and poor awareness.  4. Neuropsych: This patient is not capable of making decisions on his/her own behalf.  5. DM type 2 insulin dependent : BS have been poorly controlled. Continue detemir with meal coverage and SSI. Monitor CBG AC/HS checks.  6. Orthostasis: will check ortho static BP in am and set parameters for BP meds.  7. New onset seizures: Continue keppra 250 mg bid. Monitor for recurrence.  8. Resting tachycardia: likely due to deconditioning. Will monitor with bid checks and patient symptoms.  9. Hypokalemia: likely dilutional due to IVF supplement and recheck 10/28.  10. ABLA: continue to monitor for trends. Recheck 10/28.  11. Reactive leucocytosis due to aspiration PNA: Resolving. BC/UC negative. Check follow up labs on 10/28.  12. Dysphagia: Will nocturnal IVF. Doubt that patient will be able to maintain adequate hydration on honey liquids by tsp. Push po fluids as able 13.  Pneumonia vs pleural effusion, lungs sound ok but Xray shows LLL fluid.  Afebrile now on Avelox  LOS (Days) 2 A FACE TO FACE EVALUATION WAS PERFORMED  KIRSTEINS,ANDREW E 08/29/2012, 7:10 AM

## 2012-08-30 ENCOUNTER — Inpatient Hospital Stay (HOSPITAL_COMMUNITY): Payer: Medicare Other | Admitting: Speech Pathology

## 2012-08-30 ENCOUNTER — Encounter (HOSPITAL_COMMUNITY): Payer: Federal, State, Local not specified - PPO | Admitting: Occupational Therapy

## 2012-08-30 ENCOUNTER — Inpatient Hospital Stay (HOSPITAL_COMMUNITY): Payer: Medicare Other | Admitting: Physical Therapy

## 2012-08-30 ENCOUNTER — Inpatient Hospital Stay (HOSPITAL_COMMUNITY): Payer: Medicare Other | Admitting: Occupational Therapy

## 2012-08-30 LAB — CBC WITH DIFFERENTIAL/PLATELET
Basophils Absolute: 0.1 10*3/uL (ref 0.0–0.1)
Basophils Relative: 0 % (ref 0–1)
Eosinophils Absolute: 0.3 10*3/uL (ref 0.0–0.7)
Eosinophils Relative: 2 % (ref 0–5)
Lymphocytes Relative: 26 % (ref 12–46)
MCH: 31.5 pg (ref 26.0–34.0)
MCHC: 32.4 g/dL (ref 30.0–36.0)
MCV: 97.3 fL (ref 78.0–100.0)
Platelets: 506 10*3/uL — ABNORMAL HIGH (ref 150–400)
RDW: 12.8 % (ref 11.5–15.5)
WBC: 13 10*3/uL — ABNORMAL HIGH (ref 4.0–10.5)

## 2012-08-30 LAB — COMPREHENSIVE METABOLIC PANEL
ALT: 33 U/L (ref 0–35)
AST: 36 U/L (ref 0–37)
Albumin: 2.3 g/dL — ABNORMAL LOW (ref 3.5–5.2)
Calcium: 8.1 mg/dL — ABNORMAL LOW (ref 8.4–10.5)
Creatinine, Ser: 1.12 mg/dL — ABNORMAL HIGH (ref 0.50–1.10)
Sodium: 139 mEq/L (ref 135–145)
Total Protein: 6.4 g/dL (ref 6.0–8.3)

## 2012-08-30 LAB — GLUCOSE, CAPILLARY
Glucose-Capillary: 178 mg/dL — ABNORMAL HIGH (ref 70–99)
Glucose-Capillary: 162 mg/dL — ABNORMAL HIGH (ref 70–99)
Glucose-Capillary: 122 mg/dL — ABNORMAL HIGH (ref 70–99)

## 2012-08-30 MED ORDER — SODIUM CHLORIDE 0.9 % IV SOLN: INTRAVENOUS | Status: DC

## 2012-08-30 MED ORDER — ENSURE PUDDING PO PUDG
1.0000 | Freq: Three times a day (TID) | ORAL | Status: DC
  Administered 2012-08-30 – 2012-09-02 (×10): 1 via ORAL

## 2012-08-30 MED ORDER — SODIUM CHLORIDE 0.9 % IV SOLN
INTRAVENOUS | Status: DC
Start: 2012-08-30 — End: 2012-09-01
  Administered 2012-08-30 – 2012-08-31 (×2): via INTRAVENOUS

## 2012-08-30 NOTE — Progress Notes (Signed)
Speech Language Pathology Daily Session Note  Patient Details  Name: [name] [name] MRN: [id number] Date of Birth: 10-19-1941  Today's Date: 08/30/2012 Time: 3086-5784 Time Calculation (min): 45 min  Short Term Goals: Week 1: SLP Short Term Goal 1 (Week 1): Patient will tolerate Dys. 1 textures and honey thick liquids via teaspoon with mod assist verbal cues for use of compensatory strategies. SLP Short Term Goal 2 (Week 1): Patient will tolerate an objective swallow study to determine presence of aspiration/penetration with min assist.   SLP Short Term Goal 3 (Week 1): Patient will sustain attention for 5 minutes during a functional task with mod assist verbal cues for redirection.   SLP Short Term Goal 3 - Progress (Week 1): Progressing toward goal SLP Short Term Goal 4 (Week 1): Patient will utilize compensatory strategies to facilitate increased recall of new information with mod-max assist verbal cues.   SLP Short Term Goal 4 - Progress (Week 1): Progressing toward goal SLP Short Term Goal 5 (Week 1): Patient will solve basic problems during a familiar task with mod assist verbal cues.  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 SLP Short Term Goal 6 - Progress (Week 1): Progressing toward goal  Skilled Therapeutic Interventions: Treatment focused on cognitive recovery. SLP provided a structured, basic, familiar, and functional ADL in which patient was able to sustain attention to for 15 minutes with moderate verbal and visual cueing, primarily for redirection to task direction. Patient able to verbalize 2 deficits related to acute CVA with moderate questioning cues, and able to demonstrate basic problem solving for functional tasks with min-mod verbal cueing.    FIM:  Comprehension Comprehension Mode:  Auditory Comprehension: 5-Understands basic 90% of the time/requires cueing < 10% of the time Expression Expression Mode: Verbal Expression: 5-Expresses complex 90% of the time/cues < 10% of the time Social Interaction Social Interaction: 4-Interacts appropriately 75 - 89% of the time - Needs redirection for appropriate language or to initiate interaction. Problem Solving Problem Solving: 3-Solves basic 50 - 74% of the time/requires cueing 25 - 49% of the time Memory Memory: 3-Recognizes or recalls 50 - 74% of the time/requires cueing 25 - 49% of the time  Pain Pain Assessment Pain Assessment: No/denies pain Pain Score: 0-No pain  Therapy/Group: Individual Therapy  Ferdinand Lango MA, CCC-SLP [phone]   Ferdinand Lango Meryl 08/30/2012, 11:31 AM

## 2012-08-30 NOTE — Progress Notes (Signed)
Inpatient Diabetes Program Recommendations  AACE/ADA: New Consensus Statement on Inpatient Glycemic Control (2013)  Target Ranges:  Prepandial:   less than 140 mg/dL      Peak postprandial:   less than 180 mg/dL (1-2 hours)      Critically ill patients:  140 - 180 mg/dL   Reason for Visit: Post-prandial hyperglycemia  Inpatient Diabetes Program Recommendations Insulin - Basal: xxxx Insulin - Meal Coverage: Please add some meal coverae to regimen.  Fasting glucose is fairly well controlled, but post-prandials are high even using modeate correction.  Note: Unsure as to whether patient got correction this am with fasting glucose of 178 mg/dL.  Next cbg before lunch was 389 mg/dL Will check with RN

## 2012-08-30 NOTE — Care Management Note (Signed)
Inpatient Rehabilitation Center Individual Statement of Services  Patient Name:  [name] [name]  Date:  08/30/2012  Welcome to the Inpatient Rehabilitation Center.  Our goal is to provide you with an individualized program based on your diagnosis and situation, designed to meet your specific needs.  With this comprehensive rehabilitation program, you will be expected to participate in at least 3 hours of rehabilitation therapies Monday-Friday, with modified therapy programming on the weekends.  Your rehabilitation program will include the following services:  Physical Therapy (PT), Occupational Therapy (OT), Speech Therapy (ST), 24 hour per day rehabilitation nursing, Therapeutic Recreaction (TR), Neuropsychology, Case Management (RN and Social Worker), Rehabilitation Medicine, Nutrition Services and Pharmacy Services  Weekly team conferences will be held on Wednesday to discuss your progress.  Your RN Case Designer, television/film set will talk with you frequently to get your input and to update you on team discussions.  Team conferences with you and your family in attendance may also be held.  Expected length of stay: 7-12 days Overall anticipated outcome: supervision/min level  Depending on your progress and recovery, your program may change.  Your RN Case Estate agent will coordinate services and will keep you informed of any changes.  Your RN Sports coach and SW names and contact numbers are listed  below.  The following services may also be recommended but are not provided by the Inpatient Rehabilitation Center:   Driving Evaluations  Home Health Rehabiltiation Services  Outpatient Rehabilitatation [facility]  Vocational Rehabilitation   Arrangements will be made to provide these services after discharge if needed.  Arrangements include referral to agencies that provide these services.  Your insurance has been verified to be:  Medicare & BCBS Your primary doctor is:  Dr. Aida Puffer  Pertinent information will be shared with your doctor and your insurance company.   Social Worker:  Dossie Der, [place] [phone]  Information discussed with and copy given to patient by: Lucy Chris, 08/30/2012, 8:32 AM

## 2012-08-30 NOTE — Progress Notes (Signed)
Patient information reviewed and entered into eRehab system by Vida Nicol, RN, CRRN, PPS Coordinator.  Information including medical coding and functional independence measure will be reviewed and updated through discharge.     Per nursing patient was given "Data Collection Information Summary for Patients in Inpatient Rehabilitation Facilities with attached "Privacy Act Statement-Health Care Records" upon admission.  

## 2012-08-30 NOTE — Progress Notes (Signed)
Physical Therapy Note  Patient Details  Name: [name] [name] MRN: [id number] Date of Birth: 1941-03-23 Today's Date: 08/30/2012  Time In:  11:45 Time Out:  12:10.  Individual session, no c/o pain.  Treatment focused on use of therapeutic activities with emphasis on sustained attention, dynamic standing balance without UE support, safe use of walker during functional tasks, increasing activity tolerance, basic problem solving, following one step directions, functional ambulation.   Norton Pastel 08/30/2012, 1:56 PM

## 2012-08-30 NOTE — Progress Notes (Signed)
Patient ID: [name] [name], female   DOB: Jun 06, 1941, 71 y.o.   MRN: [id number]  Subjective/Complaints: [name] [name] is a 71 y.o. female with history of DM, fall 10 days PTA on 08/18/12 with worsening of HA and 3 day history of running into things on the left with gait problems. Was taking ASA q 6 hours for headaches. MRI head done on outpatient basis with right posterior temporo-occipital hemorrhage and patient admitted for workup. Patient developed seizure on 10/17 pm and was loaded with dilantin and started on Keppra. EEG done revealing moderately severe, continuous nonspecific slowing of cerebral activity with question of encephalopathy v/s post ictal state. She did develop fever with hypotension and patient intubated on 10/18. CCM consulted for input and patient started on IV zosyn briefly for presumed aspiration PNA. I'm cold.  Sleeping but arouses to voice Review of Systems  Unable to perform ROS: mental status change    Objective: Vital Signs: Blood pressure 113/63, pulse 82, temperature 98.9 F (37.2 C), temperature source Oral, resp. rate 18, weight 52 kg (114 lb 10.2 oz), SpO2 93.00%. Dg Chest 2 View  08/28/2012  *RADIOLOGY REPORT*  Clinical Data: 72 year old female with fever and recent pneumonia.  CHEST - 2 VIEW  Comparison: 08/24/2012 and earlier.  Findings: Confluent left lower lobe opacity appears new or increased from prior.  Enteric tube is been removed.  Stable lung volumes.  Stable cardiac size and mediastinal contours.  Stable biapical scarring.  No pulmonary edema.  No pneumothorax.  Right lung grossly clear. No acute osseous abnormality identified.  IMPRESSION: Confluent opacity at the left lung base is increased since 08/24/2012 and in this setting is compatible with pneumonia plus/minus small effusion.  Recommend post-treatment radiographs to document resolution.   Original Report Authenticated By: Harley Hallmark, M.D.    Results for orders placed during the hospital encounter of  08/27/12 (from the past 72 hour(s))  GLUCOSE, CAPILLARY     Status: Normal   Collection Time   08/27/12  4:33 PM      Component Value Range Comment   Glucose-Capillary 73  70 - 99 mg/dL    Comment 1 Notify RN     URINALYSIS, ROUTINE W REFLEX MICROSCOPIC     Status: Abnormal   Collection Time   08/27/12  6:29 PM      Component Value Range Comment   Color, Urine YELLOW  YELLOW    APPearance CLEAR  CLEAR    Specific Gravity, Urine 1.013  1.005 - 1.030    pH 7.5  5.0 - 8.0    Glucose, UA NEGATIVE  NEGATIVE mg/dL    Hgb urine dipstick SMALL (*) NEGATIVE    Bilirubin Urine NEGATIVE  NEGATIVE    Ketones, ur NEGATIVE  NEGATIVE mg/dL    Protein, ur 30 (*) NEGATIVE mg/dL    Urobilinogen, UA 0.2  0.0 - 1.0 mg/dL    Nitrite NEGATIVE  NEGATIVE    Leukocytes, UA NEGATIVE  NEGATIVE   URINE MICROSCOPIC-ADD ON     Status: Abnormal   Collection Time   08/27/12  6:29 PM      Component Value Range Comment   Squamous Epithelial / LPF RARE  RARE    RBC / HPF 3-6  <3 RBC/hpf    Casts HYALINE CASTS (*) NEGATIVE   URINE CULTURE     Status: Normal   Collection Time   08/27/12  6:30 PM      Component Value Range Comment   Specimen Description URINE, CLEAN CATCH  Special Requests NONE      Culture  Setup Time 08/27/2012 19:12      Colony Count 5,000 COLONIES/ML      Culture INSIGNIFICANT GROWTH      Report Status 08/28/2012 FINAL     GLUCOSE, CAPILLARY     Status: Abnormal   Collection Time   08/27/12  8:59 PM      Component Value Range Comment   Glucose-Capillary 204 (*) 70 - 99 mg/dL    Comment 1 Notify RN     GLUCOSE, CAPILLARY     Status: Abnormal   Collection Time   08/28/12  7:58 AM      Component Value Range Comment   Glucose-Capillary 147 (*) 70 - 99 mg/dL    Comment 1 Notify RN     GLUCOSE, CAPILLARY     Status: Abnormal   Collection Time   08/28/12 12:11 PM      Component Value Range Comment   Glucose-Capillary 384 (*) 70 - 99 mg/dL    Comment 1 Notify RN     GLUCOSE,  CAPILLARY     Status: Abnormal   Collection Time   08/28/12  5:08 PM      Component Value Range Comment   Glucose-Capillary 31 (*) 70 - 99 mg/dL    Comment 1 Notify RN     GLUCOSE, CAPILLARY     Status: Abnormal   Collection Time   08/28/12  5:34 PM      Component Value Range Comment   Glucose-Capillary 162 (*) 70 - 99 mg/dL    Comment 1 Notify RN     GLUCOSE, CAPILLARY     Status: Abnormal   Collection Time   08/28/12  9:17 PM      Component Value Range Comment   Glucose-Capillary 274 (*) 70 - 99 mg/dL    Comment 1 Notify RN     GLUCOSE, CAPILLARY     Status: Normal   Collection Time   08/29/12  7:40 AM      Component Value Range Comment   Glucose-Capillary 90  70 - 99 mg/dL    Comment 1 Notify RN     GLUCOSE, CAPILLARY     Status: Abnormal   Collection Time   08/29/12 11:43 AM      Component Value Range Comment   Glucose-Capillary 440 (*) 70 - 99 mg/dL    Comment 1 Notify RN     GLUCOSE, RANDOM     Status: Abnormal   Collection Time   08/29/12 11:48 AM      Component Value Range Comment   Glucose, Bld 518 (*) 70 - 99 mg/dL   GLUCOSE, CAPILLARY     Status: Abnormal   Collection Time   08/29/12 12:41 PM      Component Value Range Comment   Glucose-Capillary 488 (*) 70 - 99 mg/dL   GLUCOSE, CAPILLARY     Status: Abnormal   Collection Time   08/29/12 12:54 PM      Component Value Range Comment   Glucose-Capillary 438 (*) 70 - 99 mg/dL   GLUCOSE, CAPILLARY     Status: Abnormal   Collection Time   08/29/12  4:23 PM      Component Value Range Comment   Glucose-Capillary 172 (*) 70 - 99 mg/dL    Comment 1 Notify RN     GLUCOSE, CAPILLARY     Status: Abnormal   Collection Time   08/29/12  8:48 PM  Component Value Range Comment   Glucose-Capillary 194 (*) 70 - 99 mg/dL    Comment 1 Notify RN        HEENT: normal Cardio: RRR Resp: CTA B/L GI: BS positive Extremity:  Pulses positive Skin:   Intact Neuro: Lethargic, Confused, Cranial Nerve II-XII normal,  Abnormal Sensory reduced on L but cannot do detailed testing due to cooperation, Normal Motor, Abnormal FMC Ataxic/ dec FMC, Inattention and Other L HH Musc/Skel:  Normal Oriented to person place and situation   Assessment/Plan: 1. Functional deficits secondary to R temporo-occipital ICH which require 3+ hours per day of interdisciplinary therapy in a comprehensive inpatient rehab setting. Physiatrist is providing close team supervision and 24 hour management of active medical problems listed below. Physiatrist and rehab team continue to assess barriers to discharge/monitor patient progress toward functional and medical goals. FIM: FIM - Bathing Bathing Steps Patient Completed: Chest;Right Arm;Left Arm;Abdomen;Front perineal area;Right upper leg;Left upper leg Bathing: 3: Mod-Patient completes 5-7 25f 10 parts or 50-74%  FIM - Upper Body Dressing/Undressing Upper body dressing/undressing steps patient completed: Thread/unthread right sleeve of pullover shirt/dresss;Thread/unthread left sleeve of pullover shirt/dress;Put head through opening of pull over shirt/dress;Pull shirt over trunk Upper body dressing/undressing: 4: Steadying assist FIM - Lower Body Dressing/Undressing Lower body dressing/undressing steps patient completed: Thread/unthread right underwear leg;Pull underwear up/down;Thread/unthread right pants leg;Pull pants up/down Lower body dressing/undressing: 3: Mod-Patient completed 50-74% of tasks     FIM - Diplomatic Services operational officer Devices: Psychiatrist Transfers: 4-To toilet/BSC: Min A (steadying Pt. > 75%);4-From toilet/BSC: Min A (steadying Pt. > 75%) (max cueing)  FIM - Banker Devices: Walker;Bed rails Bed/Chair Transfer: 4: Supine > Sit: Min A (steadying Pt. > 75%/lift 1 leg);4: Sit > Supine: Min A (steadying pt. > 75%/lift 1 leg);2: Bed > Chair or W/C: Max A (lift and lower assist);2: Chair or W/C >  Bed: Max A (lift and lower assist)  FIM - Locomotion: Wheelchair Locomotion: Wheelchair: 0: Activity did not occur FIM - Locomotion: Ambulation Locomotion: Ambulation Assistive Devices: Designer, industrial/product Ambulation/Gait Assistance: 4: Min assist Locomotion: Ambulation: 1: Travels less than 50 ft with minimal assistance (Pt.>75%) (using rolling walker)  Comprehension Comprehension Mode: Auditory Comprehension: 5-Follows basic conversation/direction: With extra time/assistive device  Expression Expression Mode: Verbal Expression: 5-Expresses basic 90% of the time/requires cueing < 10% of the time.  Social Interaction Social Interaction: 4-Interacts appropriately 75 - 89% of the time - Needs redirection for appropriate language or to initiate interaction.  Problem Solving Problem Solving: 2-Solves basic 25 - 49% of the time - needs direction more than half the time to initiate, plan or complete simple activities  Memory Memory: 3-Recognizes or recalls 50 - 74% of the time/requires cueing 25 - 49% of the time   Medical Problem List and Plan:  1. DVT Prophylaxis/Anticoagulation: Mechanical: Sequential compression devices, below knee Bilateral lower extremities  2. Pain Management: N/A  3. Mood: difficult to judge at this time due to impaired cognition and poor awareness.  4. Neuropsych: This patient is not capable of making decisions on his/her own behalf.  5. DM type 2 insulin dependent : BS have been poorly controlled. Continue detemir with meal coverage and SSI. Monitor CBG AC/HS checks.  6. Orthostasis: will check ortho static BP in am and set parameters for BP meds.  7. New onset seizures: Continue keppra 250 mg bid. Monitor for recurrence.  8. Resting tachycardia: likely due to deconditioning. Will monitor with bid  checks and patient symptoms.  9. Hypokalemia: likely dilutional due to IVF supplement and recheck 10/28.  10. ABLA: continue to monitor for trends. Recheck 10/28.    11. Reactive leucocytosis due to aspiration PNA: Resolving. BC/UC negative. Check follow up labs on 10/28.  12. Dysphagia: Will nocturnal IVF. Doubt that patient will be able to maintain adequate hydration on honey liquids by tsp. Push po fluids as able 13.  Pneumonia vs pleural effusion, lungs sound ok but Xray shows LLL fluid.  Afebrile now on Avelox  LOS (Days) 3 A FACE TO FACE EVALUATION WAS PERFORMED  Rajinder Mesick E 08/30/2012, 7:11 AM

## 2012-08-30 NOTE — Progress Notes (Signed)
Physical Therapy Session Note  Patient Details  Name: [name] [name] MRN: [id number] Date of Birth: 1941/02/22  Today's Date: 08/30/2012 Time: 1191-4782 Time Calculation (min): 60 min  Short Term Goals: Week 1:  PT Short Term Goal 1 (Week 1): = LTG  Skilled Therapeutic Interventions/Progress Updates:   Patient asleep in bed; very lethargic but willing to participate with encouragement.  Secondary to orthostasis in am assessed vitals in supine: 118/75, HR: 88 >>sitting: 108/66, 85 bpm and RN alerted to drop in BP; KHT donned in sitting and IV fluids started by RN; BP standing: 94/56 with 2-3/10 dizziness. Returned to sitting and performed bilat ankle pumps and LAQ for muscle pump action to assist with venous return.  Performed stand pivot transfers bed <> w/c and w/c <> Nustep with bilat UE support and mod A.  Performed bilat UE and LE endurance and strengthening on Nustep x 9:30 at level 6 resistance at 20-30 steps per minute; patient's LUE and LLE intermittently falling off handle or pedal without patient noticing.  Patient reports full sensation and awareness of L side but presents with decreased visual scanning to L and L inattention during functional tasks.  Following Nustep performed visual assessment: patient presents with decreased peripheral visual field to 45 deg and unable to see pen in lower visual field until pen at nose.  Tracking and saccades not smooth and delayed.  Patient returned to bed secondary to significant fatigue; encouraged to sit up for supper to allow venous system to accommodate to upright positions.    Therapy Documentation Precautions:  Precautions Precautions: Fall Restrictions Weight Bearing Restrictions: No Vital Signs: Therapy Vitals Temp: 97.8 F (36.6 C) Temp src: Oral Pulse Rate: 88  Resp: 18  BP: 94/56 mmHg Patient Position, if appropriate: Standing (with KHT donned) Oxygen Therapy SpO2: 99 % O2 Device: None (Room air) Pain: Pain Assessment Pain  Assessment: No/denies pain  See FIM for current functional status  Therapy/Group: Individual Therapy  Edman Circle [facility] 08/30/2012, 4:31 PM

## 2012-08-30 NOTE — Progress Notes (Signed)
Pt was with OT and practiced toilet transfers. Therapy called RN to room and pt was sitting on the toilet with therapy beside her and pt was unresponsive and pale to the face with multiple attempts to awaken. After a minute or two pt woke up with full recognition of the situation. BP was 118/72 with HR of 87. Blood sugar was 162. Pam Love notified and called into room. Pt in bed resting with call bell in reach. Cont. To monitor pt. Night IV fluids, NS 30ml/hr, to be started now per Love, PA orders.

## 2012-08-30 NOTE — Progress Notes (Signed)
INITIAL ADULT NUTRITION ASSESSMENT Date: 08/30/2012   Time: 12:36 PM  Reason for Assessment: Health History  INTERVENTION: 1. Ensure Pudding po TID, each supplement provides 170 kcal and 4 grams of protein. 2. Magic cup TID with meals, each supplement provides 290 kcal and 9 grams of protein. 3. RD to continue to follow nutrition care plan   DOCUMENTATION CODES Per approved criteria  -Severe malnutrition in the context of acute illness or injury   ASSESSMENT: Female 71 y.o.  Dx: Hemorrhage in the brain  Hx:  Past Medical History  Diagnosis Date  . Diabetes mellitus   . GERD (gastroesophageal reflux disease)    No past surgical history on file.  Related Meds:     . antiseptic oral rinse  15 mL Mouth Rinse BID  . insulin aspart  0-15 Units Subcutaneous TID WC  . insulin detemir  16 Units Subcutaneous Q breakfast  . levETIRAcetam  250 mg Oral BID  . metFORMIN  500 mg Oral BID WC  . metoprolol tartrate  50 mg Oral BID  . moxifloxacin  400 mg Oral Q2000  . multivitamin with minerals  1 tablet Oral Daily  . pantoprazole sodium  40 mg Oral Daily  . vitamin C  500 mg Oral Daily  . DISCONTD: antiseptic oral rinse  15 mL Mouth Rinse QID  . DISCONTD: chlorhexidine  15 mL Mouth Rinse BID   Ht:  5\' 5"  (165.1 cm)  Wt: 114 lb 10.2 oz (52 kg)  Ideal Wt:    56.8 kg % Ideal Wt: 92%  Wt Readings from Last 15 Encounters:  08/30/12 114 lb 10.2 oz (52 kg)  08/27/12 117 lb 8.1 oz (53.3 kg)  08/16/12 114 lb (51.71 kg)  08/14/12 112 lb (50.803 kg)  07/28/12 113 lb 1.5 oz (51.3 kg)  Usual Wt: 100 lb, per pt % Usual Wt: 114%  BMI is 19.1 - WNL  Food/Nutrition Related Hx: pt reports attempts to weight gain PTA  Labs:  CMP     Component Value Date/Time   NA 139 08/30/2012 0845   K 4.0 08/30/2012 0845   CL 97 08/30/2012 0845   CO2 33* 08/30/2012 0845   GLUCOSE 272* 08/30/2012 0845   BUN 26* 08/30/2012 0845   CREATININE 1.12* 08/30/2012 0845   CREATININE 1.34* 08/16/2012  1614   CALCIUM 8.1* 08/30/2012 0845   PROT 6.4 08/30/2012 0845   ALBUMIN 2.3* 08/30/2012 0845   AST 36 08/30/2012 0845   ALT 33 08/30/2012 0845   ALKPHOS 126* 08/30/2012 0845   BILITOT 0.2* 08/30/2012 0845   GFRNONAA 48* 08/30/2012 0845   GFRAA 56* 08/30/2012 0845   CBG (last 3)   Basename 08/30/12 1113 08/30/12 0715 08/29/12 2048  GLUCAP 384* 178* 194*   Phosphorus  Date/Time Value Range Status  08/25/2012  5:17 AM 2.1* 2.3 - 4.6 mg/dL Final   Magnesium  Date/Time Value Range Status  08/25/2012  5:17 AM 1.5  1.5 - 2.5 mg/dL Final    Intake/Output Summary (Last 24 hours) at 08/30/12 1239 Last data filed at 08/30/12 0900  Gross per 24 hour  Intake   1200 ml  Output      0 ml  Net   1200 ml   Diet Order: [id number] with Honey Thickened Liquids  Supplements/Tube Feeding:  IVF:    Estimated Nutritional Needs:   Kcal:  1400 -  1600 kcal Protein:  65 - 75 grams protein Fluid:  1.4 - 1.6 liters daily  Pt followed by  RD during acute hospitalization. Pt was dx with severe malnutrition in the context of acute injury during her acute stay 2/2 wt loss, inadequate intake, and severe muscle wasting. Pt was too lethargic initially after extubation for swallow evaluation, enteral nutrition was subsequently started on 10/21. MBSS on 10/23 recommending Dysphagia 1 diet with Honey Thickened Liquids. Enteral nutrition was discontinued with initiation of diet. Pt transferred to rehab on 10/25.  Intake of meals is currently variable, ranging from 5 - 50%. Pt reports that she feels limited with pureed foods. Does note that she likes Magic Cup and Honey Thickened Milk, encouraged pt to continue these at each meal. Offered to set up snacks for patient, however she declined.  Pt continues to meet criteria for severe acute malnutrition given intake of meals remains <50% and severe muscle wasting remains evident.  NUTRITION DIAGNOSIS: Inadequate oral intake r/t limited appetite and food  preferences AEB poor meal completion.  MONITORING/EVALUATION(Goals): Goal: Pt to meet >/= 90% of their estimated nutrition needs Monitor: weight trends, lab trends, I/O's, PO intake, supplement tolerance  EDUCATION NEEDS: -No education needs identified at this time   Jarold Motto MS, RD, LDN Pager: [phone] After-hours pager: [phone]

## 2012-08-30 NOTE — Progress Notes (Signed)
Occupational Therapy Note  Patient Details  Name: [name] [name] MRN: [id number] Date of Birth: 09/24/1941 Today's Date: 08/30/2012  Time in:  10;25  Time out:  11:00.  No c/o pain. Individual session.  ADL retraining at sink level with focus on consistently following one step directions, sustained attention, basic problem solving, safety awareness, sequencing, dynamic standing balance, sit to stand, weight shifting in standing, basic insight.   Norton Pastel 08/30/2012, 1:53 PM

## 2012-08-30 NOTE — Progress Notes (Signed)
Social Work Assessment and Plan Social Work Assessment and Plan  Patient Details  Name: [name] [name] MRN: [id number] Date of Birth: [birth year]  Today's Date: 08/30/2012  Problem List:  Patient Active Problem List  Diagnosis  . GERD (gastroesophageal reflux disease)  . DM type 2 (diabetes mellitus, type 2)  . Hypoglycemia due to insulin  . Hemorrhage of brain, nontraumatic  . Dysphagia  . HTN (hypertension)  . CKD (chronic kidney disease) stage 3, GFR 30-59 ml/min  . Leukocytosis  . Hemorrhage in the brain-13 x 22 x 14 mm right posterior temporo-occipital intra-axial acute  . Left hip pain  . Left hemiparesis  . Hemianopia, homonymous, left  . Hemisensory deficit, left  . Nausea  . Headache  . Encephalopathy acute  . Acute respiratory failure with hypoxia  . Seizure disorder, grand mal  . Seizure   Past Medical History:  Past Medical History  Diagnosis Date  . Diabetes mellitus   . GERD (gastroesophageal reflux disease)    Past Surgical History: No past surgical history on file. Social History:  reports that she has never smoked. She does not have any smokeless tobacco history on file. She reports that she does not drink alcohol or use illicit drugs.  Family / Support Systems Marital Status: Married Patient Roles: Spouse;Parent Spouse/Significant Other: Fredrik Cove [phone]-home  [phone] Children: 110 yo daughter at home Anticipated Caregiver: Husbaand and daughter to assist Ability/Limitations of Caregiver: Husband has some health issues but manages and is confident he can assist pt at home Caregiver Availability: 24/7 Family Dynamics: Close knit family who are there for one another.  Husband is here today to observe pt in therapies and provide emotional support  Social History Preferred language: English Religion: Unknown Cultural Background: No issues Education: High School Read: Yes Write: Yes Employment Status: Retired Fish farm manager Issues: No  issues Guardian/Conservator: None-according to MD he feels pt is not capable of making her own decisions.  Therefore will look toward husband to make her decisions for her.   Abuse/Neglect Physical Abuse: Denies Verbal Abuse: Denies Sexual Abuse: Denies Exploitation of patient/patient's resources: Denies Self-Neglect: Denies  Emotional Status Pt's affect, behavior adn adjustment status: Pt  wants to do well here and notices she is not thinking right.  When asked to sort money in therapy she had a hard time with it.  She reports: " I ususally know how to do this."  Discussed it may be the bleed she had and hopefully each day will get better. Recent Psychosocial Issues: Other medical issues Pyschiatric History: No issues-deferred depression screen due to feels doing well and husband confirms this.  Will monitor while here. Substance Abuse History: No issues  Patient / Family Perceptions, Expectations & Goals Pt/Family understanding of illness & functional limitations: Pt and husband can explain her bleed and her deficits as a result of this.  Both are hopeful she will do well here and progress.  He reports: " She has balance issues and is not thinking clearly." Premorbid pt/family roles/activities: Wife, Mother, retiree, Church member, etc Anticipated changes in roles/activities/participation: resume Pt/family expectations/goals: Pt states: " I am having difficulty with thinking and walking, I hope this gets better."  Husband reports: " We hope she gets better here, but will help her at home."  [employer]: None Premorbid Home Care/DME Agencies: None Transportation available at discharge: Family Resource referrals recommended: Support group (specify) ([facility] Support group)  Discharge Planning Living Arrangements: Spouse/significant other;Children Support Systems: Spouse/significant other;Children;Church/faith community;Friends/neighbors Type  of Residence: Private  residence Insurance Resources: Administrator (specify) Manufacturing systems engineer) Financial Resources: Restaurant manager, fast food Screen Referred: No Living Expenses: Lives with family Money Management: Spouse;Patient Do you have any problems obtaining your medications?: No Home Management: All of them Patient/Family Preliminary Plans: Return home with husband and daughter assisting with her care.  Husband is here to observe in therapies and provide support.   Social Work Anticipated Follow Up Needs: HH/OP;Support Group  Clinical Impression Pleasant couple who are very supportive of one another.  Has 24 hour care at home.  Husband plans to be here daily to assist with wife's care.  Pt acknowledges her deficits and hopes they will get better.  Lucy Chris 08/30/2012, 10:32 AM

## 2012-08-30 NOTE — Progress Notes (Signed)
Occupational Therapy Session Note  Patient Details  Name: [name] [name] MRN: [id number] Date of Birth: April 27, 1941  Today's Date: 08/30/2012 Time: 1335-1400 Time Calculation (min): 25 min  Short Term Goals: Week 1:  OT Short Term Goal 1 (Week 1): Pt will complete LB dressing with min assist  OT Short Term Goal 2 (Week 1): Pt will complete grooming in standing with min assist OT Short Term Goal 3 (Week 1): Pt will complete toilet transfer with min assist with min cues for sequencing OT Short Term Goal 4 (Week 1): Pt will complete toileting (hygiene and clothing management) with min/steady assist.  Skilled Therapeutic Interventions/Progress Updates:    Pt seen for 1:1 OT with focus on attention to Lt, ambulation with RW, and toilet transfer.  Pt ambulated with min-mod assist with RW and max cues for scanning to Lt to find bathroom door.  Pt with no visual attention to Lt side.  Pt doffed pants and sat on toilet, upon sitting pt slumped backwards on toilet and went unresponsive.  Pt's face pale and not responding to name or touch.  RN called to room and post 1-2 minute she responded.  BP was 118/72 with HR of 87. Blood sugar was 162. +2 transfer back to w/c secondary to impulsivity.  Pt returned to bed and RN attending to her.   Therapy Documentation Precautions:  Precautions Precautions: Fall Restrictions Weight Bearing Restrictions: No General:   Vital Signs: Therapy Vitals Temp: 97.8 F (36.6 C) Temp src: Oral Pulse Rate: 88  Resp: 18  BP: 94/56 mmHg Patient Position, if appropriate: Standing (with KHT donned) Oxygen Therapy SpO2: 99 % O2 Device: None (Room air) Pain: Pain Assessment Pain Assessment: No/denies pain  See FIM for current functional status  Therapy/Group: Individual Therapy  Leonette Monarch 08/30/2012, 4:35 PM

## 2012-08-31 ENCOUNTER — Inpatient Hospital Stay (HOSPITAL_COMMUNITY): Payer: Medicare Other | Admitting: Physical Therapy

## 2012-08-31 ENCOUNTER — Inpatient Hospital Stay (HOSPITAL_COMMUNITY): Payer: Medicare Other | Admitting: Speech Pathology

## 2012-08-31 ENCOUNTER — Inpatient Hospital Stay (HOSPITAL_COMMUNITY): Payer: Medicare Other | Admitting: Occupational Therapy

## 2012-08-31 DIAGNOSIS — R569 Unspecified convulsions: Secondary | ICD-10-CM

## 2012-08-31 DIAGNOSIS — I619 Nontraumatic intracerebral hemorrhage, unspecified: Secondary | ICD-10-CM

## 2012-08-31 LAB — BASIC METABOLIC PANEL
BUN: 24 mg/dL — ABNORMAL HIGH (ref 6–23)
Chloride: 100 mEq/L (ref 96–112)
Creatinine, Ser: 1.06 mg/dL (ref 0.50–1.10)
Glucose, Bld: 178 mg/dL — ABNORMAL HIGH (ref 70–99)

## 2012-08-31 LAB — GLUCOSE, CAPILLARY: Glucose-Capillary: 94 mg/dL (ref 70–99)

## 2012-08-31 NOTE — Progress Notes (Signed)
Speech Language Pathology Daily Session Note  Patient Details  Name: [name] [name] MRN: [id number] Date of Birth: Jan 03, 1941  Today's Date: 08/31/2012 Time: 1135-1205 Time Calculation (min): 30 min  Short Term Goals: Week 1: SLP Short Term Goal 1 (Week 1): Patient will tolerate Dys. 1 textures and honey thick liquids via teaspoon with mod assist verbal cues for use of compensatory strategies. SLP Short Term Goal 2 (Week 1): Patient will tolerate an objective swallow study to determine presence of aspiration/penetration with min assist.   SLP Short Term Goal 3 (Week 1): Patient will sustain attention for 5 minutes during a functional task with mod assist verbal cues for redirection.   SLP Short Term Goal 3 - Progress (Week 1): Progressing toward goal SLP Short Term Goal 4 (Week 1): Patient will utilize compensatory strategies to facilitate increased recall of new information with mod-max assist verbal cues.   SLP Short Term Goal 4 - Progress (Week 1): Progressing toward goal SLP Short Term Goal 5 (Week 1): Patient will solve basic problems during a familiar task with mod assist verbal cues.  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 SLP Short Term Goal 6 - Progress (Week 1): Progressing toward goal  Skilled Therapeutic Interventions: Treatment session focused on addressing dysphagia and cognitive goals.  SLP facilitated session with min assist verbal and visual cues to sequence and problem solve oral care, followed by trials of small cup sips of water characterized by a timely swallow, slightly reduce hyolaryngeal excursion and two swallows.  Trials of Dys.3 textures resulted in occasional throat clears and max assist to utilize extra swallows to reduce suspected pharyngeal residue.  During trials of honey-thick liquids  via cup patient demonstrated no functional difference when compared to thin liquid trials.  Given previous MBSS results SLP recommends an objective assessment tomorrow to rule out sensory deficits.  SLP also facilitated session with basic problem solving tasks such as sorting, counting and adding change as well as navigating back to room with overall mod assist verbal and visual cues to complete various tasks.     FIM:  Comprehension Comprehension Mode: Auditory Comprehension: 4-Understands basic 75 - 89% of the time/requires cueing 10 - 24% of the time Expression Expression Mode: Verbal Expression: 5-Expresses basic 90% of the time/requires cueing < 10% of the time. Social Interaction Social Interaction: 5-Interacts appropriately 90% of the time - Needs monitoring or encouragement for participation or interaction. Problem Solving Problem Solving: 3-Solves basic 50 - 74% of the time/requires cueing 25 - 49% of the time Memory Memory: 3-Recognizes or recalls 50 - 74% of the time/requires cueing 25 - 49% of the time FIM - Eating Eating Activity: 5: Supervision/cues  Pain Pain Assessment Pain Assessment: No/denies pain  Therapy/Group: Individual Therapy  Charlane Ferretti., CCC-SLP [phone]  Kmya Placide 08/31/2012, 12:31 PM

## 2012-08-31 NOTE — Progress Notes (Signed)
Occupational Therapy Session Note  Patient Details  Name: [name] [name] MRN: [id number] Date of Birth: 1941-02-18  Today's Date: 08/31/2012 Time: 0730-0825 and 1135-1205 Time Calculation (min): 55 min and 30 min  Short Term Goals: Week 1:  OT Short Term Goal 1 (Week 1): Pt will complete LB dressing with min assist  OT Short Term Goal 2 (Week 1): Pt will complete grooming in standing with min assist OT Short Term Goal 3 (Week 1): Pt will complete toilet transfer with min assist with min cues for sequencing OT Short Term Goal 4 (Week 1): Pt will complete toileting (hygiene and clothing management) with min/steady assist.  Skilled Therapeutic Interventions/Progress Updates:    1) Pt seen for ADL retraining with focus on stand pivot transfers, sit <> stand, dynamic standing balance, and attention to Lt body and environment during self-care tasks of bathing and dressing.  Pt sitting up in bed ready to participate upon arrival.  Stand pivot transfer to w/c on Rt with min/steady assist and mod verbal cues.  Pt reported needing to toilet and performed stand pivot transfer from w/c to toilet with min assist and use of grab bar.  Bathing completed at sink with cues to attend to Lt environment to turn on water, obtain soap and washcloth, and obtain clothing.  Pt with sporadic difficulty obtaining items on Lt.  Engaged in self-feeding with focus on opening containers and scanning to Lt to obtain necessary items.  2) 1:1 OT with focus on Lt attention and scanning environment for every day household objects.  Pt able to locate items on Lt side with slight head turn to Lt however demonstrated depth perception issues when reaching for objects or placing them in this therapist's hands.  Visual scanning activity with cards for pt to locate matching cards with extreme difficulty recalling number on card and finding cards on left of visual field.  Therapy Documentation Precautions:  Precautions Precautions:  Fall Restrictions Weight Bearing Restrictions: No General:   Vital Signs: Therapy Vitals Temp: 98.8 F (37.1 C) Temp src: Oral Pulse Rate: 86  Resp: 18  BP: 126/49 mmHg Patient Position, if appropriate: Lying Oxygen Therapy SpO2: 97 % O2 Device: None (Room air) Pain:  Pt with no c/o pain this session.  See FIM for current functional status  Therapy/Group: Individual Therapy  Leonette Monarch 08/31/2012, 8:30 AM

## 2012-08-31 NOTE — Progress Notes (Signed)
Physical Therapy Session Note  Patient Details  Name: [name] [name] MRN: [id number] Date of Birth: 12/04/1940  Today's Date: 08/31/2012 Time: 0905-1000 Time Calculation (min): 55 min  Short Term Goals: Week 1:  PT Short Term Goal 1 (Week 1): = LTG  Therapy Documentation Precautions:  Precautions Precautions: Fall Restrictions Weight Bearing Restrictions: No Vital Signs: Therapy Vitals Temp: 98.8 F (37.1 C) Temp src: Oral Pulse Rate: 93  Resp: 18  BP: 117/69 mmHg (116/67 standing) Patient Position, if appropriate: Sitting Oxygen Therapy SpO2: 97 % O2 Device: None (Room air) Pain: Pain Assessment Pain Assessment: No/denies pain Locomotion : Stairs / Additional Locomotion Stairs: Yes Stairs Assistance: 4: Min assist Stairs Assistance Details: Tactile cues for sequencing;Tactile cues for placement;Visual cues/gestures for sequencing;Visual cues/gestures for precautions/safety;Verbal cues for sequencing;Verbal cues for technique;Verbal cues for precautions/safety Stairs Assistance Details (indicate cue type and reason): Required verbal cues and visual cues to attend to L foot position on step for safety Stair Management Technique: Two rails;Step to pattern;Alternating pattern;Forwards Number of Stairs: 5  Height of Stairs: 6  Wheelchair Mobility Wheelchair Mobility: No  Patient performed gait in controlled environment with RW and min-mod A overall secondary to narrow BOS, upright trunk posture, intermittent verbal cues to attend to L visual field, environment and safe management of AD around obstacles. Balance: Standardized Balance Assessment Standardized Balance Assessment: Berg Balance Test Berg Balance Test Sit to Stand: Able to stand  independently using hands Standing Unsupported: Able to stand 2 minutes with supervision Sitting with Back Unsupported but Feet Supported on Floor or Stool: Able to sit safely and securely 2 minutes Stand to Sit: Controls descent by  using hands Transfers: Needs one person to assist Standing Unsupported with Eyes Closed: Able to stand 10 seconds with supervision Standing Ubsupported with Feet Together: Needs help to attain position but able to stand for 30 seconds with feet together From Standing, Reach Forward with Outstretched Arm: Loses balance while trying/requires external support From Standing Position, Pick up Object from Floor: Unable to try/needs assist to keep balance From Standing Position, Turn to Look Behind Over each Shoulder: Needs supervision when turning Turn 360 Degrees: Needs assistance while turning Standing Unsupported, Alternately Place Feet on Step/Stool: Able to complete >2 steps/needs minimal assist Standing Unsupported, One Foot in Front: Loses balance while stepping or standing Standing on One Leg: Unable to try or needs assist to prevent fall Total Score: 20  Patient demonstrates increased fall risk as noted by score of  20/56 on Berg Balance Scale.  (<36= high risk for falls, close to 100%; 37-45 significant >80%; 46-51 moderate >50%; 52-55 lower >25%)  Other Treatments:  Performed NMR for trunk and postural control to prepare for sit > stand in sitting reaching with R or LUE out of BOS and across midline with focus on anterior pelvic tilt and maintain trunk elongation during anterior lean and lateral trunk excursion reaching up, forwards and to the L; final reach patient cued to reach up and forwards with LUE until patient transferred into stand position without UE support and min A.  See FIM for current functional status  Therapy/Group: Individual Therapy  Edman Circle [facility] 08/31/2012, 10:32 AM

## 2012-08-31 NOTE — Progress Notes (Signed)
Inpatient Diabetes Program Recommendations  AACE/ADA: New Consensus Statement on Inpatient Glycemic Control (2013)  Target Ranges:  Prepandial:   less than 140 mg/dL      Peak postprandial:   less than 180 mg/dL (1-2 hours)      Critically ill patients:  140 - 180 mg/dL   Highest postprandial CBG last two days has been at lunch time.  Could start scheduled Novolog meal coverage with breakfast only and then add with lunch and dinner if needed.    Inpatient Diabetes Program Recommendations Insulin - Basal: Add Levemir 5 units at HS (patient takes BID dose at home) Insulin - Meal Coverage: Add Novolog 3-4 units TID with meals for elevated postprandials  Thank you  Piedad Climes [facility] Inpatient Diabetes Coordinator [phone]

## 2012-08-31 NOTE — Progress Notes (Signed)
Patient ID: [name] [name], female   DOB: 1940-11-23, 71 y.o.   MRN: [id number]  Subjective/Complaints: [name] [name] is a 71 y.o. female with history of DM, fall 10 days PTA on 08/18/12 with worsening of HA and 3 day history of running into things on the left with gait problems. Was taking ASA q 6 hours for headaches. MRI head done on outpatient basis with right posterior temporo-occipital hemorrhage and patient admitted for workup. Patient developed seizure on 10/17 pm and was loaded with dilantin and started on Keppra. EEG done revealing moderately severe, continuous nonspecific slowing of cerebral activity with question of encephalopathy v/s post ictal state. She did develop fever with hypotension and patient intubated on 10/18. CCM consulted for input and patient started on IV zosyn briefly for presumed aspiration PNA. I'm cold.  Sleeping but arouses to voice Review of Systems  Unable to perform ROS: mental status change    Objective: Vital Signs: Blood pressure 126/49, pulse 86, temperature 98.8 F (37.1 C), temperature source Oral, resp. rate 18, weight 52 kg (114 lb 10.2 oz), SpO2 97.00%. No results found. Results for orders placed during the hospital encounter of 08/27/12 (from the past 72 hour(s))  GLUCOSE, CAPILLARY     Status: Abnormal   Collection Time   08/28/12  7:58 AM      Component Value Range Comment   Glucose-Capillary 147 (*) 70 - 99 mg/dL    Comment 1 Notify RN     GLUCOSE, CAPILLARY     Status: Abnormal   Collection Time   08/28/12 12:11 PM      Component Value Range Comment   Glucose-Capillary 384 (*) 70 - 99 mg/dL    Comment 1 Notify RN     GLUCOSE, CAPILLARY     Status: Abnormal   Collection Time   08/28/12  5:08 PM      Component Value Range Comment   Glucose-Capillary 31 (*) 70 - 99 mg/dL    Comment 1 Notify RN     GLUCOSE, CAPILLARY     Status: Abnormal   Collection Time   08/28/12  5:34 PM      Component Value Range Comment   Glucose-Capillary 162 (*) 70 - 99  mg/dL    Comment 1 Notify RN     GLUCOSE, CAPILLARY     Status: Abnormal   Collection Time   08/28/12  9:17 PM      Component Value Range Comment   Glucose-Capillary 274 (*) 70 - 99 mg/dL    Comment 1 Notify RN     GLUCOSE, CAPILLARY     Status: Normal   Collection Time   08/29/12  7:40 AM      Component Value Range Comment   Glucose-Capillary 90  70 - 99 mg/dL    Comment 1 Notify RN     GLUCOSE, CAPILLARY     Status: Abnormal   Collection Time   08/29/12 11:43 AM      Component Value Range Comment   Glucose-Capillary 440 (*) 70 - 99 mg/dL    Comment 1 Notify RN     GLUCOSE, RANDOM     Status: Abnormal   Collection Time   08/29/12 11:48 AM      Component Value Range Comment   Glucose, Bld 518 (*) 70 - 99 mg/dL   GLUCOSE, CAPILLARY     Status: Abnormal   Collection Time   08/29/12 12:41 PM      Component Value Range Comment   Glucose-Capillary 488 (*)  70 - 99 mg/dL   GLUCOSE, CAPILLARY     Status: Abnormal   Collection Time   08/29/12 12:54 PM      Component Value Range Comment   Glucose-Capillary 438 (*) 70 - 99 mg/dL   GLUCOSE, CAPILLARY     Status: Abnormal   Collection Time   08/29/12  4:23 PM      Component Value Range Comment   Glucose-Capillary 172 (*) 70 - 99 mg/dL    Comment 1 Notify RN     GLUCOSE, CAPILLARY     Status: Abnormal   Collection Time   08/29/12  8:48 PM      Component Value Range Comment   Glucose-Capillary 194 (*) 70 - 99 mg/dL    Comment 1 Notify RN     GLUCOSE, CAPILLARY     Status: Abnormal   Collection Time   08/30/12  7:15 AM      Component Value Range Comment   Glucose-Capillary 178 (*) 70 - 99 mg/dL    Comment 1 Notify RN     CBC WITH DIFFERENTIAL     Status: Abnormal   Collection Time   08/30/12  8:45 AM      Component Value Range Comment   WBC 13.0 (*) 4.0 - 10.5 K/uL    RBC 3.68 (*) 3.87 - 5.11 MIL/uL    Hemoglobin 11.6 (*) 12.0 - 15.0 g/dL    HCT 56.2 (*) 13.0 - 46.0 %    MCV 97.3  78.0 - 100.0 fL    MCH 31.5  26.0 - 34.0  pg    MCHC 32.4  30.0 - 36.0 g/dL    RDW 86.5  78.4 - 69.6 %    Platelets 506 (*) 150 - 400 K/uL    Neutrophils Relative 65  43 - 77 %    Neutro Abs 8.4 (*) 1.7 - 7.7 K/uL    Lymphocytes Relative 26  12 - 46 %    Lymphs Abs 3.3  0.7 - 4.0 K/uL    Monocytes Relative 7  3 - 12 %    Monocytes Absolute 0.9  0.1 - 1.0 K/uL    Eosinophils Relative 2  0 - 5 %    Eosinophils Absolute 0.3  0.0 - 0.7 K/uL    Basophils Relative 0  0 - 1 %    Basophils Absolute 0.1  0.0 - 0.1 K/uL   COMPREHENSIVE METABOLIC PANEL     Status: Abnormal   Collection Time   08/30/12  8:45 AM      Component Value Range Comment   Sodium 139  135 - 145 mEq/L    Potassium 4.0  3.5 - 5.1 mEq/L    Chloride 97  96 - 112 mEq/L    CO2 33 (*) 19 - 32 mEq/L    Glucose, Bld 272 (*) 70 - 99 mg/dL    BUN 26 (*) 6 - 23 mg/dL    Creatinine, Ser 2.95 (*) 0.50 - 1.10 mg/dL    Calcium 8.1 (*) 8.4 - 10.5 mg/dL    Total Protein 6.4  6.0 - 8.3 g/dL    Albumin 2.3 (*) 3.5 - 5.2 g/dL    AST 36  0 - 37 U/L    ALT 33  0 - 35 U/L    Alkaline Phosphatase 126 (*) 39 - 117 U/L    Total Bilirubin 0.2 (*) 0.3 - 1.2 mg/dL    GFR calc non Af Amer 48 (*) >90 mL/min  GFR calc Af Amer 56 (*) >90 mL/min   GLUCOSE, CAPILLARY     Status: Abnormal   Collection Time   08/30/12 11:13 AM      Component Value Range Comment   Glucose-Capillary 384 (*) 70 - 99 mg/dL    Comment 1 Notify RN     GLUCOSE, CAPILLARY     Status: Abnormal   Collection Time   08/30/12  1:55 PM      Component Value Range Comment   Glucose-Capillary 162 (*) 70 - 99 mg/dL    Comment 1 Notify RN     GLUCOSE, CAPILLARY     Status: Abnormal   Collection Time   08/30/12  4:53 PM      Component Value Range Comment   Glucose-Capillary 122 (*) 70 - 99 mg/dL    Comment 1 Documented in Chart      Comment 2 Notify RN        HEENT: normal Cardio: RRR Resp: CTA B/L GI: BS positive Extremity:  Pulses positive Skin:   Intact Neuro: Awake and alert, Cranial Nerve II-XII  normal, Abnormal Sensory reduced on L , Normal Motor, Abnormal FMC Ataxic/ dec FMC, Inattention on L mild Musc/Skel:  Normal Oriented to person place and situation   Assessment/Plan: 1. Functional deficits secondary to R temporo-occipital ICH which require 3+ hours per day of interdisciplinary therapy in a comprehensive inpatient rehab setting. Physiatrist is providing close team supervision and 24 hour management of active medical problems listed below. Physiatrist and rehab team continue to assess barriers to discharge/monitor patient progress toward functional and medical goals. FIM: FIM - Bathing Bathing Steps Patient Completed: Chest;Right Arm;Left Arm;Abdomen;Front perineal area;Buttocks;Right upper leg;Left upper leg;Right lower leg (including foot);Left lower leg (including foot) Bathing: 4: Steadying assist  FIM - Upper Body Dressing/Undressing Upper body dressing/undressing steps patient completed: Thread/unthread left sleeve of pullover shirt/dress;Put head through opening of pull over shirt/dress;Pull shirt over trunk;Thread/unthread right sleeve of front closure shirt/dress Upper body dressing/undressing: 5: Supervision: Safety issues/verbal cues FIM - Lower Body Dressing/Undressing Lower body dressing/undressing steps patient completed: Thread/unthread right underwear leg;Thread/unthread left underwear leg;Pull underwear up/down;Thread/unthread right pants leg;Thread/unthread left pants leg;Pull pants up/down;Don/Doff right sock;Don/Doff left sock;Don/Doff right shoe;Don/Doff left shoe Lower body dressing/undressing: 5: Supervision: Safety issues/verbal cues (spatial relation and problem solving issues)  FIM - Toileting Toileting steps completed by patient: Adjust clothing prior to toileting;Performs perineal hygiene;Adjust clothing after toileting Toileting Assistive Devices: Grab bar or rail for support Toileting: 4: Steadying assist  FIM - Scientist, research (physical sciences) Devices: Grab bars;Walker Toilet Transfers: 4-To toilet/BSC: Min A (steadying Pt. > 75%);3-From toilet/BSC: Mod A (lift or lower assist)  FIM - Banker Devices: Walker;Bed rails Bed/Chair Transfer: 4: Supine > Sit: Min A (steadying Pt. > 75%/lift 1 leg);4: Sit > Supine: Min A (steadying pt. > 75%/lift 1 leg);3: Bed > Chair or W/C: Mod A (lift or lower assist);3: Chair or W/C > Bed: Mod A (lift or lower assist)  FIM - Locomotion: Wheelchair Locomotion: Wheelchair: 1: Total Assistance/staff pushes wheelchair (Pt<25%) FIM - Locomotion: Ambulation Locomotion: Ambulation Assistive Devices: Designer, industrial/product Ambulation/Gait Assistance: 4: Min assist Locomotion: Ambulation: 0: Activity did not occur  Comprehension Comprehension Mode: Auditory Comprehension: 4-Understands basic 75 - 89% of the time/requires cueing 10 - 24% of the time  Expression Expression Mode: Verbal Expression: 5-Expresses basic 90% of the time/requires cueing < 10% of the time.  Social Interaction Social Interaction: 5-Interacts appropriately 90% of the  time - Needs monitoring or encouragement for participation or interaction.  Problem Solving Problem Solving: 3-Solves basic 50 - 74% of the time/requires cueing 25 - 49% of the time  Memory Memory: 3-Recognizes or recalls 50 - 74% of the time/requires cueing 25 - 49% of the time   Medical Problem List and Plan:  1. DVT Prophylaxis/Anticoagulation: Mechanical: Sequential compression devices, below knee Bilateral lower extremities  2. Pain Management: N/A  3. Mood: difficult to judge at this time due to impaired cognition and poor awareness.  4. Neuropsych: This patient is not capable of making decisions on his/her own behalf.  5. DM type 2 insulin dependent : BS have been poorly controlled. Continue detemir with meal coverage and SSI. Monitor CBG AC/HS checks.  6. Orthostasis: will check ortho static BP in am and set  parameters for BP meds.  7. New onset seizures: Continue keppra 250 mg bid. Monitor for recurrence. No signs thus far 8. Resting tachycardia: likely due to deconditioning. Will monitor with bid checks and patient symptoms.  9. Hypokalemia: likely dilutional due to IVF supplement and recheck 10/28.  10. ABLA: continue to monitor for trends. Recheck 10/28.  11. Reactive leucocytosis due to aspiration PNA: Resolving. BC/UC negative. Check follow up labs on 10/28.  12. Dysphagia: Will nocturnal IVF. Doubt that patient will be able to maintain adequate hydration on honey liquids by tsp. Push po fluids as able 13.  Pneumonia vs pleural effusion, lungs sound ok but Xray shows LLL fluid.  Afebrile now on Avelox  LOS (Days) 4 A FACE TO FACE EVALUATION WAS PERFORMED  KIRSTEINS,ANDREW E 08/31/2012, 7:11 AM

## 2012-09-01 ENCOUNTER — Inpatient Hospital Stay (HOSPITAL_COMMUNITY): Payer: Medicare Other | Admitting: Physical Therapy

## 2012-09-01 ENCOUNTER — Inpatient Hospital Stay (HOSPITAL_COMMUNITY): Payer: Federal, State, Local not specified - PPO | Admitting: Occupational Therapy

## 2012-09-01 ENCOUNTER — Ambulatory Visit (HOSPITAL_COMMUNITY): Payer: Federal, State, Local not specified - PPO | Admitting: *Deleted

## 2012-09-01 ENCOUNTER — Inpatient Hospital Stay (HOSPITAL_COMMUNITY): Payer: Medicare Other | Admitting: Occupational Therapy

## 2012-09-01 ENCOUNTER — Inpatient Hospital Stay (HOSPITAL_COMMUNITY): Payer: Medicare Other | Admitting: Speech Pathology

## 2012-09-01 ENCOUNTER — Inpatient Hospital Stay (HOSPITAL_COMMUNITY): Payer: Medicare Other

## 2012-09-01 LAB — GLUCOSE, CAPILLARY
Glucose-Capillary: 125 mg/dL — ABNORMAL HIGH (ref 70–99)
Glucose-Capillary: 254 mg/dL — ABNORMAL HIGH (ref 70–99)
Glucose-Capillary: 259 mg/dL — ABNORMAL HIGH (ref 70–99)
Glucose-Capillary: 90 mg/dL (ref 70–99)
Glucose-Capillary: 231 mg/dL — ABNORMAL HIGH (ref 70–99)

## 2012-09-01 MED ORDER — FAMOTIDINE 40 MG/5ML PO SUSR
20.0000 mg | Freq: Two times a day (BID) | ORAL | Status: DC
  Administered 2012-09-01 – 2012-09-10 (×19): 20 mg via ORAL
  Filled 2012-09-01 (×21): qty 2.5

## 2012-09-01 NOTE — Procedures (Signed)
The procedure has been reviewed and SLP is in agreement. Fae Pippin, M.A., CCC-SLP [phone]

## 2012-09-01 NOTE — Procedures (Deleted)
Objective Swallowing Evaluation: Modified Barium Swallowing Study  Patient Details  Name: [name] [name] MRN: [id number] Date of Birth: 1941/02/21  Today's Date: 09/01/2012 Time: 0930-1000 Time Calculation (min): 30 min  Past Medical History:  Past Medical History  Diagnosis Date  . Diabetes mellitus   . GERD (gastroesophageal reflux disease)    Past Surgical History: No past surgical history on file. HPI:  71 y/o with h/x of GERD, DM. Admitted to [facility] after MRI showed subacute R. intraparenchymal bleed in parietal area. CXR showed mild left lower lobe airspace disease (could represent atelectisis, raises concern for infection or less likely aspiration). CT showed acute parenchymal hemorrhage in right occipital lobe. MBS was completed 08/25/2012, revealing a mild-moderate oropharyngeal phase dysphagia and diet of Dys. 1 honey-thick liquids was recommended.  Pt admitted to CIR 08/27/12 with continued presentation of mild-moderate motor and sensory based dysphagia per results of 08/31/12 BSE which SLP suspects was exacerbated by recent extubation.  SLP feels MBS is warranted at this time to objectively determine the presence of aspiration/penetration.       Recommendation/Prognosis  Clinical Impression Clinical impression: MBS completed today to objectively determine presence of aspiration/penetration.  Patient's diet upgraded to Dys. 2, thin liquids due to improvements in oropharyngeal swallow function compared to previous MBS 08/25/12.  Oral and pharyngeal phases of swallow exhibit both motor and sensory impairments.  Pt continues to exhibit reduced tongue base retraction and laryngeal elevation resulting in moderate vallecular and mild-moderate pyriform sinus residue with nectar, honey, and thin consistencies. Of note, soft palate exhibited increased repetitive movement during velopharyngeal port closure which SLP suspects is a compensatory mechanism for pt's reduced base of tongue retraction.   Pt  exhibits delayed A-P transit and prolonged mastication of bolus during oral phase on regular textures and moderate residue at the level of the vallecula as well as the pyriform sinuses which was cleared to mild-trace with thin liquid wash.  Pt also reflexively performs approximately 3 swallows per sip/bite and utilizes intermittent throat clear with Max A verbal and visual cuing which reduces pharyngeal residue to mild-trace. Overall, pt exhibited no s/s of penetration/aspiration during this study, and as a result of this and other clinical indicators, pt's aspiration risk is mild-moderate.  ST will continue to follow for safety efficiency and continued pt/family education.   Swallow Evaluation Recommendations Diet Recommendations: Dysphagia 2 (Fine chop);Thin liquid Liquid Administration via: Cup;Straw Medication Administration: Whole meds with puree Supervision: Full supervision/cueing for compensatory strategies;Patient able to self feed Compensations: Slow rate;Small sips/bites;Multiple dry swallows after each bite/sip;Follow solids with liquid;Clear throat intermittently;Effortful swallow Postural Changes and/or Swallow Maneuvers: Seated upright 90 degrees Oral Care Recommendations: Oral care BID Follow up Recommendations: 24 hour supervision/assistance Prognosis Prognosis for Safe Diet Advancement: Good Barriers to Reach Goals: Cognitive deficits Individuals Consulted Consulted and Agree with Results and Recommendations: Patient;Family member/caregiver Family Member Consulted: husband  SLP Assessment/Plan Clinical impression: MBS completed today to objectively determine presence of aspiration/penetration.  Patient's diet upgraded to Dys. 2, thin liquids due to improvements in oropharyngeal swallow function compared to previous MBS 08/25/12.  Oral and pharyngeal phases of swallow exhibit both motor and sensory impairments.  Pt continues to exhibit reduced tongue base retraction and laryngeal  elevation resulting in moderate vallecular and mild-moderate pyriform sinus residue with nectar, honey, and thin consistencies. Of note, soft palate exhibited increased repetitive movement during velopharyngeal port closure which SLP suspects is a compensatory mechanism for pt's reduced base of tongue retraction.   Pt exhibits delayed A-P  transit and prolonged mastication of bolus during oral phase on regular textures and moderate residue at the level of the vallecula as well as the pyriform sinuses which was cleared to mild-trace with thin liquid wash.  Pt also reflexively performs approximately 3 swallows per sip/bite and utilizes intermittent throat clear with Max A verbal and visual cuing which reduces pharyngeal residue to mild-trace. Overall, pt exhibited no s/s of penetration/aspiration during this study, and as a result of this and other clinical indicators, pt's aspiration risk is mild-moderate.  ST will continue to follow for safety efficiency and continued pt/family education.    Short Term Goals: Week 1: SLP Short Term Goal 1 (Week 1): Patient will tolerate Dys. 2 textures and thin liquids via teaspoon with mod assist verbal cues for use of compensatory strategies. SLP Short Term Goal 2 (Week 1): Patient will tolerate an objective swallow study to determine presence of aspiration/penetration with min assist.   SLP Short Term Goal 3 (Week 1): Patient will sustain attention for 5 minutes during a functional task with mod assist verbal cues for redirection.   SLP Short Term Goal 3 - Progress (Week 1): Progressing toward goal SLP Short Term Goal 4 (Week 1): Patient will utilize compensatory strategies to facilitate increased recall of new information with mod-max assist verbal cues.   SLP Short Term Goal 4 - Progress (Week 1): Progressing toward goal SLP Short Term Goal 5 (Week 1): Patient will solve basic problems during a familiar task with mod assist verbal cues.  SLP Short Term Goal 5 -  Progress (Week 1): Progressing toward goal SLP Short Term Goal 6 (Week 1): Patient will demonstrate improved intellectual awareness by identifying 2 new deficits that occurred as a result of CVA. SLP Short Term Goal 6 - Progress (Week 1): Progressing toward goal  General:  Date of Onset: 08/18/12 HPI: 71 y/o with h/x of GERD, DM. Admitted to [facility] after MRI showed subacute R. intraparenchymal bleed in parietal area. CXR showed mild left lower lobe airspace disease (could represent atelectisis, raises concern for infection or less likely aspiration). CT showed acute parenchymal hemorrhage in right occipital lobe. MBS was completed 08/25/2012, revealing a mild-moderate oropharyngeal phase dysphagia and diet of Dys. 1 honey-thick liquids was recommended.  Pt admitted to CIR 08/27/12 with continued presentation of mild-moderate motor and sensory based dysphagia per results of 08/31/12 BSE which SLP suspects was exacerbated by recent extubation.  SLP feels MBS is warranted at this time to objectively determine the presence of aspiration/penetration.   Type of Study: Modified Barium Swallowing Study Reason for Referral: Objectively evaluate swallowing function Previous Swallow Assessment: BSE 08/28/12 Diet Prior to this Study: Dysphagia 1 (puree);Honey-thick liquids Temperature Spikes Noted: No Respiratory Status: Room air History of Recent Intubation: Yes Length of Intubations (days): 2 days Date extubated: 08/21/12 Behavior/Cognition: Alert;Cooperative;Pleasant mood;Hard of hearing Oral Cavity - Dentition: Adequate natural dentition Self-Feeding Abilities: Needs set up;Able to feed self Patient Positioning: Upright in chair Baseline Vocal Quality: Low vocal intensity Volitional Cough: Strong Volitional Swallow: Able to elicit Anatomy: Within functional limits Pharyngeal Secretions: Not observed secondary MBS  Reason for Referral:  Objectively evaluate swallowing function   Oral Phase Oral  Preparation/Oral Phase Oral Phase: Impaired Oral - Honey Oral - Honey Teaspoon: Not tested Oral - Honey Cup: Weak lingual manipulation Oral - Nectar Oral - Nectar Teaspoon: Not tested Oral - Nectar Cup: Weak lingual manipulation Oral - Thin Oral - Thin Teaspoon: Not tested Oral - Thin Cup: Weak lingual manipulation  Oral - Thin Straw: Weak lingual manipulation Oral - Solids Oral - Regular: Impaired mastication;Weak lingual manipulation;Reduced posterior propulsion;Delayed oral transit Pharyngeal Phase  Pharyngeal Phase Pharyngeal Phase: Impaired Pharyngeal - Honey Pharyngeal - Honey Cup: Delayed swallow initiation;Premature spillage to valleculae;Reduced laryngeal elevation;Reduced tongue base retraction;Pharyngeal residue - pyriform sinuses;Pharyngeal residue - valleculae Pharyngeal - Nectar Pharyngeal - Nectar Cup: Delayed swallow initiation;Premature spillage to valleculae;Reduced laryngeal elevation;Reduced tongue base retraction;Pharyngeal residue - pyriform sinuses;Pharyngeal residue - valleculae Pharyngeal - Thin Pharyngeal - Thin Cup: Delayed swallow initiation;Premature spillage to valleculae;Reduced laryngeal elevation;Reduced tongue base retraction;Pharyngeal residue - pyriform sinuses;Pharyngeal residue - valleculae Pharyngeal - Thin Straw: Delayed swallow initiation;Premature spillage to pyriform sinuses;Reduced laryngeal elevation;Reduced tongue base retraction;Pharyngeal residue - valleculae;Pharyngeal residue - pyriform sinuses Pharyngeal - Solids Pharyngeal - Regular: Delayed swallow initiation;Premature spillage to valleculae;Pharyngeal residue - valleculae;Pharyngeal residue - pyriform sinuses;Reduced tongue base retraction;Reduced laryngeal elevation Cervical Esophageal Phase  Cervical Esophageal Phase Cervical Esophageal Phase: [facility]  Sarvesh Meddaugh, Joni Reining 09/01/2012, 11:22 AM

## 2012-09-01 NOTE — Procedures (Signed)
Objective Swallowing Evaluation: Modified Barium Swallowing Study  Patient Details  Name: [name] [name] MRN: [id number] Date of Birth: 1941/02/21  Today's Date: 09/01/2012 Time: 0930-1000 Time Calculation (min): 30 min  Past Medical History:  Past Medical History  Diagnosis Date  . Diabetes mellitus   . GERD (gastroesophageal reflux disease)    Past Surgical History: No past surgical history on file. HPI:  71 y/o with h/x of GERD, DM. Admitted to [facility] after MRI showed subacute R. intraparenchymal bleed in parietal area. CXR showed mild left lower lobe airspace disease (could represent atelectisis, raises concern for infection or less likely aspiration). CT showed acute parenchymal hemorrhage in right occipital lobe. MBS was completed 08/25/2012, revealing a mild-moderate oropharyngeal phase dysphagia and diet of Dys. 1 honey-thick liquids was recommended.  Pt admitted to CIR 08/27/12 with continued presentation of mild-moderate motor and sensory based dysphagia per results of 08/31/12 BSE which SLP suspects was exacerbated by recent extubation.  SLP feels MBS is warranted at this time to objectively determine the presence of aspiration/penetration.       Recommendation/Prognosis  Clinical Impression Clinical impression: MBS completed today to objectively determine presence of aspiration/penetration.  Patient's diet upgraded to Dys. 2, thin liquids due to improvements in oropharyngeal swallow function compared to previous MBS 08/25/12.  Oral and pharyngeal phases of swallow exhibit both motor and sensory impairments.  Pt continues to exhibit reduced tongue base retraction and laryngeal elevation resulting in moderate vallecular and mild-moderate pyriform sinus residue with nectar, honey, and thin consistencies. Of note, soft palate exhibited increased repetitive movement during velopharyngeal port closure which SLP suspects is a compensatory mechanism for pt's reduced base of tongue retraction.   Pt  exhibits delayed A-P transit and prolonged mastication of bolus during oral phase on regular textures and moderate residue at the level of the vallecula as well as the pyriform sinuses which was cleared to mild-trace with thin liquid wash.  Pt also reflexively performs approximately 3 swallows per sip/bite and utilizes intermittent throat clear with Max A verbal and visual cuing which reduces pharyngeal residue to mild-trace. Overall, pt exhibited no s/s of penetration/aspiration during this study, and as a result of this and other clinical indicators, pt's aspiration risk is mild-moderate.  ST will continue to follow for safety efficiency and continued pt/family education.   Swallow Evaluation Recommendations Diet Recommendations: Dysphagia 2 (Fine chop);Thin liquid Liquid Administration via: Cup;Straw Medication Administration: Whole meds with puree Supervision: Full supervision/cueing for compensatory strategies;Patient able to self feed Compensations: Slow rate;Small sips/bites;Multiple dry swallows after each bite/sip;Follow solids with liquid;Clear throat intermittently;Effortful swallow Postural Changes and/or Swallow Maneuvers: Seated upright 90 degrees Oral Care Recommendations: Oral care BID Follow up Recommendations: 24 hour supervision/assistance Prognosis Prognosis for Safe Diet Advancement: Good Barriers to Reach Goals: Cognitive deficits Individuals Consulted Consulted and Agree with Results and Recommendations: Patient;Family member/caregiver Family Member Consulted: husband  SLP Assessment/Plan Clinical impression: MBS completed today to objectively determine presence of aspiration/penetration.  Patient's diet upgraded to Dys. 2, thin liquids due to improvements in oropharyngeal swallow function compared to previous MBS 08/25/12.  Oral and pharyngeal phases of swallow exhibit both motor and sensory impairments.  Pt continues to exhibit reduced tongue base retraction and laryngeal  elevation resulting in moderate vallecular and mild-moderate pyriform sinus residue with nectar, honey, and thin consistencies. Of note, soft palate exhibited increased repetitive movement during velopharyngeal port closure which SLP suspects is a compensatory mechanism for pt's reduced base of tongue retraction.   Pt exhibits delayed A-P  transit and prolonged mastication of bolus during oral phase on regular textures and moderate residue at the level of the vallecula as well as the pyriform sinuses which was cleared to mild-trace with thin liquid wash.  Pt also reflexively performs approximately 3 swallows per sip/bite and utilizes intermittent throat clear with Max A verbal and visual cuing which reduces pharyngeal residue to mild-trace. Overall, pt exhibited no s/s of penetration/aspiration during this study, and as a result of this and other clinical indicators, pt's aspiration risk is mild-moderate.  ST will continue to follow for safety efficiency and continued pt/family education.     General:  Date of Onset: 08/18/12 HPI: 71 y/o with h/x of GERD, DM. Admitted to [facility] after MRI showed subacute R. intraparenchymal bleed in parietal area. CXR showed mild left lower lobe airspace disease (could represent atelectisis, raises concern for infection or less likely aspiration). CT showed acute parenchymal hemorrhage in right occipital lobe. MBS was completed 08/25/2012, revealing a mild-moderate oropharyngeal phase dysphagia and diet of Dys. 1 honey-thick liquids was recommended.  Pt admitted to CIR 08/27/12 with continued presentation of mild-moderate motor and sensory based dysphagia per results of 08/31/12 BSE which SLP suspects was exacerbated by recent extubation.  SLP feels MBS is warranted at this time to objectively determine the presence of aspiration/penetration.   Type of Study: Modified Barium Swallowing Study Reason for Referral: Objectively evaluate swallowing function Previous Swallow Assessment:  BSE 08/28/12 Diet Prior to this Study: Dysphagia 1 (puree);Honey-thick liquids Temperature Spikes Noted: No Respiratory Status: Room air History of Recent Intubation: Yes Length of Intubations (days): 2 days Date extubated: 08/21/12 Behavior/Cognition: Alert;Cooperative;Pleasant mood;Hard of hearing Oral Cavity - Dentition: Adequate natural dentition Self-Feeding Abilities: Needs set up;Able to feed self Patient Positioning: Upright in chair Baseline Vocal Quality: Low vocal intensity Volitional Cough: Strong Volitional Swallow: Able to elicit Anatomy: Within functional limits Pharyngeal Secretions: Not observed secondary MBS  Reason for Referral:  Objectively evaluate swallowing function   Oral Phase Oral Preparation/Oral Phase Oral Phase: Impaired Oral - Honey Oral - Honey Teaspoon: Not tested Oral - Honey Cup: Weak lingual manipulation Oral - Nectar Oral - Nectar Teaspoon: Not tested Oral - Nectar Cup: Weak lingual manipulation Oral - Thin Oral - Thin Teaspoon: Not tested Oral - Thin Cup: Weak lingual manipulation Oral - Thin Straw: Weak lingual manipulation Oral - Solids Oral - Regular: Impaired mastication;Weak lingual manipulation;Reduced posterior propulsion;Delayed oral transit Pharyngeal Phase  Pharyngeal Phase Pharyngeal Phase: Impaired Pharyngeal - Honey Pharyngeal - Honey Cup: Delayed swallow initiation;Premature spillage to valleculae;Reduced laryngeal elevation;Reduced tongue base retraction;Pharyngeal residue - pyriform sinuses;Pharyngeal residue - valleculae Pharyngeal - Nectar Pharyngeal - Nectar Cup: Delayed swallow initiation;Premature spillage to valleculae;Reduced laryngeal elevation;Reduced tongue base retraction;Pharyngeal residue - pyriform sinuses;Pharyngeal residue - valleculae Pharyngeal - Thin Pharyngeal - Thin Cup: Delayed swallow initiation;Premature spillage to valleculae;Reduced laryngeal elevation;Reduced tongue base retraction;Pharyngeal  residue - pyriform sinuses;Pharyngeal residue - valleculae Pharyngeal - Thin Straw: Delayed swallow initiation;Premature spillage to pyriform sinuses;Reduced laryngeal elevation;Reduced tongue base retraction;Pharyngeal residue - valleculae;Pharyngeal residue - pyriform sinuses Pharyngeal - Solids Pharyngeal - Regular: Delayed swallow initiation;Premature spillage to valleculae;Pharyngeal residue - valleculae;Pharyngeal residue - pyriform sinuses;Reduced tongue base retraction;Reduced laryngeal elevation Cervical Esophageal Phase  Cervical Esophageal Phase Cervical Esophageal Phase: [facility]  Jackalyn Lombard, B.A.  Graduate Clinician Speech Language Pathology  Damaree Sargent, Joni Reining 09/01/2012, 1:51 PM

## 2012-09-01 NOTE — Progress Notes (Signed)
Physical Therapy Session Note  Patient Details  Name: [name] [name] MRN: [id number] Date of Birth: 1941/02/26  Today's Date: 09/01/2012 Time: 1130-1200 Time Calculation (min): 30 min  Short Term Goals: Week 1:  PT Short Term Goal 1 (Week 1): = LTG  Skilled Therapeutic Interventions/Progress Updates:   Upon entering room patient removing KHT and shoes stating she was very hot.  Patient also very disoriented and required reorienting to time, place and situation.  Performed w/c mobility training on unit x 150' with mod A with frequent verbal and visual cues to attend to L environment and for propulsion sequencing to maintain straight navigation and obstacle negotiation on L side.  Demonstrated to patient how to perform one step up/down with RW for home entry/exit; patient gave repeat demonstration with min A and max verbal cues for safety with RW; patient began to c/o tightness in L side/middle of chest; denies SOB or pain in arm or back.  RN and PA notified, vitals assessed; patient returned to bed for EKG.   Therapy Documentation Precautions:  Precautions Precautions: Fall Restrictions Weight Bearing Restrictions: No Pain: Pain Assessment Pain Assessment: No/denies pain Pain Score: 0-No pain Locomotion : Ambulation Ambulation/Gait Assistance: 4: Min assist Wheelchair Mobility Distance: 150  See FIM for current functional status  Therapy/Group: Individual Therapy  Edman Circle Faucette 09/01/2012, 12:05 PM

## 2012-09-01 NOTE — Progress Notes (Signed)
Patient with complaints of chest tightness in therapy.  She was attempting walking when symptoms occurred. HR on dynamap reading 150s . Exam revealed HR 104 and regular. EKG done revealing NSR at 81 bpm. Patient feels symptoms likely due to indigestion? Will treat with maalox. Add pepcid bid due to history of GERD. Will check cardiac enzymes for now. Nurse to monitor.

## 2012-09-01 NOTE — Progress Notes (Signed)
Occupational Therapy Session Note  Patient Details  Name: [name] [name] MRN: [id number] Date of Birth: September 26, 1941  Today's Date: 09/01/2012 Time: 0830-0910 and 1400-1430 Time Calculation (min): 40 min and 30 min  Short Term Goals: Week 1:  OT Short Term Goal 1 (Week 1): Pt will complete LB dressing with min assist  OT Short Term Goal 2 (Week 1): Pt will complete grooming in standing with min assist OT Short Term Goal 3 (Week 1): Pt will complete toilet transfer with min assist with min cues for sequencing OT Short Term Goal 4 (Week 1): Pt will complete toileting (hygiene and clothing management) with min/steady assist.  Skilled Therapeutic Interventions/Progress Updates:    1) Pt seen for ADL retraining with focus on stand pivot transfers, sit <> stand, dynamic standing balance, and attention to Lt body/environment with self-care tasks.  Pt demonstrating increased ability to obtain items on Lt with ability to retrieve hairbrush and deodorant.  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  Pt light min/steady assist in standing with LB bathing and dressing, progressing to supervision.  Pt completed grooming tasks in standing with light min/steady assist.  Pt missed 20 mins secondary to transport retrieving pt for MBSS.  2) 1:1 OT with focus on functional mobility with RW, transfers, attention to Lt environment, and [facility]  Upon entering room, pt reports needing to toilet.  Ambulated from bed to toilet with min assist and use of RW, pt required mod verbal and tactile cues to attend to Lt environment as bathroom is on left of room (and would have walked past it).  Pt completed 3/3 toileting steps with min/steady assist and completed hand hygiene in standing with min/steady assist.  Engaged in bead stringing activity as pt enjoys beadwork, with all supplies setup on Lt side of body to encourage scanning to and attending to Lt side.  Pt continues to present with depth  perception deficits which increased challenge of [facility] activity.  Therapy Documentation Precautions:  Precautions Precautions: Fall Restrictions Weight Bearing Restrictions: No General: General Amount of Missed OT Time (min): 20 Minutes Vital Signs:   Pain: Pain Assessment Pain Assessment: No/denies pain Pain Score: 0-No pain  See FIM for current functional status  Therapy/Group: Individual Therapy  Leonette Monarch 09/01/2012, 12:20 PM

## 2012-09-01 NOTE — Progress Notes (Signed)
Patient ID: [name] [name], female   DOB: [birth year], 71 y.o.   MRN: [id number] Subjective/Complaints: [name] [name] is a 71 y.o. female with history of DM, fall 10 days PTA on 08/18/12 with worsening of HA and 3 day history of running into things on the left with gait problems. Was taking ASA q 6 hours for headaches. MRI head done on outpatient basis with right posterior temporo-occipital hemorrhage and patient admitted for workup. Patient developed seizure on 10/17 pm and was loaded with dilantin and started on Keppra. EEG done revealing moderately severe, continuous nonspecific slowing of cerebral activity with question of encephalopathy v/s post ictal state. She did develop fever with hypotension and patient intubated on 10/18. CCM consulted for input and patient started on IV zosyn briefly for presumed aspiration PNA.   Sleeping but arouses to voice Review of Systems  Unable to perform ROS: mental status change    Objective: Vital Signs: Blood pressure 116/65, pulse 95, temperature 98.4 F (36.9 C), temperature source Oral, resp. rate 19, weight 52.2 kg (115 lb 1.3 oz), SpO2 97.00%. No results found. Results for orders placed during the hospital encounter of 08/27/12 (from the past 72 hour(s))  GLUCOSE, CAPILLARY     Status: Normal   Collection Time   08/29/12  7:40 AM      Component Value Range Comment   Glucose-Capillary 90  70 - 99 mg/dL    Comment 1 Notify RN     GLUCOSE, CAPILLARY     Status: Abnormal   Collection Time   08/29/12 11:43 AM      Component Value Range Comment   Glucose-Capillary 440 (*) 70 - 99 mg/dL    Comment 1 Notify RN     GLUCOSE, RANDOM     Status: Abnormal   Collection Time   08/29/12 11:48 AM      Component Value Range Comment   Glucose, Bld 518 (*) 70 - 99 mg/dL   GLUCOSE, CAPILLARY     Status: Abnormal   Collection Time   08/29/12 12:41 PM      Component Value Range Comment   Glucose-Capillary 488 (*) 70 - 99 mg/dL   GLUCOSE, CAPILLARY     Status: Abnormal   Collection Time   08/29/12 12:54 PM      Component Value Range Comment   Glucose-Capillary 438 (*) 70 - 99 mg/dL   GLUCOSE, CAPILLARY     Status: Abnormal   Collection Time   08/29/12  4:23 PM      Component Value Range Comment   Glucose-Capillary 172 (*) 70 - 99 mg/dL    Comment 1 Notify RN     GLUCOSE, CAPILLARY     Status: Abnormal   Collection Time   08/29/12  8:48 PM      Component Value Range Comment   Glucose-Capillary 194 (*) 70 - 99 mg/dL    Comment 1 Notify RN     GLUCOSE, CAPILLARY     Status: Abnormal   Collection Time   08/30/12  7:15 AM      Component Value Range Comment   Glucose-Capillary 178 (*) 70 - 99 mg/dL    Comment 1 Notify RN     CBC WITH DIFFERENTIAL     Status: Abnormal   Collection Time   08/30/12  8:45 AM      Component Value Range Comment   WBC 13.0 (*) 4.0 - 10.5 K/uL    RBC 3.68 (*) 3.87 - 5.11 MIL/uL    Hemoglobin 11.6 (*)  12.0 - 15.0 g/dL    HCT 16.1 (*) 09.6 - 46.0 %    MCV 97.3  78.0 - 100.0 fL    MCH 31.5  26.0 - 34.0 pg    MCHC 32.4  30.0 - 36.0 g/dL    RDW 04.5  40.9 - 81.1 %    Platelets 506 (*) 150 - 400 K/uL    Neutrophils Relative 65  43 - 77 %    Neutro Abs 8.4 (*) 1.7 - 7.7 K/uL    Lymphocytes Relative 26  12 - 46 %    Lymphs Abs 3.3  0.7 - 4.0 K/uL    Monocytes Relative 7  3 - 12 %    Monocytes Absolute 0.9  0.1 - 1.0 K/uL    Eosinophils Relative 2  0 - 5 %    Eosinophils Absolute 0.3  0.0 - 0.7 K/uL    Basophils Relative 0  0 - 1 %    Basophils Absolute 0.1  0.0 - 0.1 K/uL   COMPREHENSIVE METABOLIC PANEL     Status: Abnormal   Collection Time   08/30/12  8:45 AM      Component Value Range Comment   Sodium 139  135 - 145 mEq/L    Potassium 4.0  3.5 - 5.1 mEq/L    Chloride 97  96 - 112 mEq/L    CO2 33 (*) 19 - 32 mEq/L    Glucose, Bld 272 (*) 70 - 99 mg/dL    BUN 26 (*) 6 - 23 mg/dL    Creatinine, Ser 9.14 (*) 0.50 - 1.10 mg/dL    Calcium 8.1 (*) 8.4 - 10.5 mg/dL    Total Protein 6.4  6.0 - 8.3 g/dL    Albumin 2.3  (*) 3.5 - 5.2 g/dL    AST 36  0 - 37 U/L    ALT 33  0 - 35 U/L    Alkaline Phosphatase 126 (*) 39 - 117 U/L    Total Bilirubin 0.2 (*) 0.3 - 1.2 mg/dL    GFR calc non Af Amer 48 (*) >90 mL/min    GFR calc Af Amer 56 (*) >90 mL/min   GLUCOSE, CAPILLARY     Status: Abnormal   Collection Time   08/30/12 11:13 AM      Component Value Range Comment   Glucose-Capillary 384 (*) 70 - 99 mg/dL    Comment 1 Notify RN     GLUCOSE, CAPILLARY     Status: Abnormal   Collection Time   08/30/12  1:55 PM      Component Value Range Comment   Glucose-Capillary 162 (*) 70 - 99 mg/dL    Comment 1 Notify RN     GLUCOSE, CAPILLARY     Status: Abnormal   Collection Time   08/30/12  4:53 PM      Component Value Range Comment   Glucose-Capillary 122 (*) 70 - 99 mg/dL    Comment 1 Documented in Chart      Comment 2 Notify RN     BASIC METABOLIC PANEL     Status: Abnormal   Collection Time   08/31/12  7:00 AM      Component Value Range Comment   Sodium 142  135 - 145 mEq/L    Potassium 4.3  3.5 - 5.1 mEq/L    Chloride 100  96 - 112 mEq/L    CO2 32  19 - 32 mEq/L    Glucose, Bld 178 (*) 70 -  99 mg/dL    BUN 24 (*) 6 - 23 mg/dL    Creatinine, Ser 1.61  0.50 - 1.10 mg/dL    Calcium 7.8 (*) 8.4 - 10.5 mg/dL    GFR calc non Af Amer 52 (*) >90 mL/min    GFR calc Af Amer 60 (*) >90 mL/min   GLUCOSE, CAPILLARY     Status: Abnormal   Collection Time   08/31/12  7:08 AM      Component Value Range Comment   Glucose-Capillary 161 (*) 70 - 99 mg/dL    Comment 1 Notify RN     GLUCOSE, CAPILLARY     Status: Abnormal   Collection Time   08/31/12 11:11 AM      Component Value Range Comment   Glucose-Capillary 349 (*) 70 - 99 mg/dL    Comment 1 Notify RN     GLUCOSE, CAPILLARY     Status: Normal   Collection Time   08/31/12  5:03 PM      Component Value Range Comment   Glucose-Capillary 70  70 - 99 mg/dL   GLUCOSE, CAPILLARY     Status: Normal   Collection Time   08/31/12 10:15 PM      Component Value  Range Comment   Glucose-Capillary 94  70 - 99 mg/dL      HEENT: normal Cardio: RRR Resp: CTA B/L GI: BS positive Extremity:  Pulses positive Skin:   Intact Neuro: Awake and alert, Cranial Nerve II-XII normal, Abnormal Sensory reduced on L , Normal Motor, Abnormal FMC Ataxic/ dec FMC, Inattention on L mild, apraxia Musc/Skel:  Normal Oriented to person place and situation   Assessment/Plan: 1. Functional deficits secondary to R temporo-occipital ICH which require 3+ hours per day of interdisciplinary therapy in a comprehensive inpatient rehab setting. Physiatrist is providing close team supervision and 24 hour management of active medical problems listed below. Physiatrist and rehab team continue to assess barriers to discharge/monitor patient progress toward functional and medical goals. FIM: FIM - Bathing Bathing Steps Patient Completed: Chest;Right Arm;Left Arm;Abdomen;Front perineal area;Buttocks;Right upper leg;Left upper leg;Right lower leg (including foot);Left lower leg (including foot) Bathing: 4: Steadying assist  FIM - Upper Body Dressing/Undressing Upper body dressing/undressing steps patient completed: Thread/unthread right sleeve of pullover shirt/dresss;Thread/unthread left sleeve of pullover shirt/dress;Put head through opening of pull over shirt/dress;Pull shirt over trunk Upper body dressing/undressing: 5: Supervision: Safety issues/verbal cues FIM - Lower Body Dressing/Undressing Lower body dressing/undressing steps patient completed: Thread/unthread right underwear leg;Thread/unthread left underwear leg;Pull underwear up/down;Thread/unthread right pants leg;Thread/unthread left pants leg;Pull pants up/down;Don/Doff right sock;Don/Doff left sock Lower body dressing/undressing: 4: Steadying Assist  FIM - Toileting Toileting steps completed by patient: Adjust clothing prior to toileting;Performs perineal hygiene;Adjust clothing after toileting Toileting Assistive  Devices: Grab bar or rail for support Toileting: 4: Steadying assist  FIM - Diplomatic Services operational officer Devices: Grab bars Toilet Transfers: 4-To toilet/BSC: Min A (steadying Pt. > 75%);4-From toilet/BSC: Min A (steadying Pt. > 75%)  FIM - Bed/Chair Transfer Bed/Chair Transfer Assistive Devices: Therapist, occupational: 3: Bed > Chair or W/C: Mod A (lift or lower assist);3: Chair or W/C > Bed: Mod A (lift or lower assist)  FIM - Locomotion: Wheelchair Locomotion: Wheelchair: 0: Activity did not occur FIM - Locomotion: Ambulation Locomotion: Ambulation Assistive Devices: Designer, industrial/product Ambulation/Gait Assistance: 4: Min assist;3: Mod assist Locomotion: Ambulation: 3: Travels 150 ft or more with moderate assistance (Pt: 50 - 74%)  Comprehension Comprehension Mode: Auditory Comprehension: 4-Understands basic 75 -  89% of the time/requires cueing 10 - 24% of the time  Expression Expression Mode: Verbal Expression: 5-Expresses basic 90% of the time/requires cueing < 10% of the time.  Social Interaction Social Interaction: 5-Interacts appropriately 90% of the time - Needs monitoring or encouragement for participation or interaction.  Problem Solving Problem Solving: 3-Solves basic 50 - 74% of the time/requires cueing 25 - 49% of the time  Memory Memory: 3-Recognizes or recalls 50 - 74% of the time/requires cueing 25 - 49% of the time   Medical Problem List and Plan:  1. DVT Prophylaxis/Anticoagulation: Mechanical: Sequential compression devices, below knee Bilateral lower extremities  2. Pain Management: N/A  3. Mood: difficult to judge at this time due to impaired cognition and poor awareness.  4. Neuropsych: This patient is not capable of making decisions on his/her own behalf.  5. DM type 2 insulin dependent : BS have been poorly controlled. Continue detemir with meal coverage and SSI. Monitor CBG AC/HS checks.  6. Orthostasis: will check ortho static BP in  am and set parameters for BP meds.  7. New onset seizures: Continue keppra 250 mg bid. Monitor for recurrence. No signs thus far 8. Resting tachycardia: likely due to deconditioning. Will monitor with bid checks and patient symptoms.  9. Hypokalemia: likely dilutional due to IVF supplement and recheck 10/28.  10. ABLA: continue to monitor for trends. Recheck 10/28.  11. Reactive leucocytosis due to aspiration PNA: Resolving. BC/UC negative. Check follow up labs on 10/28.  12. Dysphagia: Will nocturnal IVF. Doubt that patient will be able to maintain adequate hydration on honey liquids by tsp. Push po fluids as able 13.  Pneumonia vs pleural effusion, lungs sound ok but Xray shows LLL fluid.  Afebrile now on Avelox  LOS (Days) 5 A FACE TO FACE EVALUATION WAS PERFORMED  KIRSTEINS,ANDREW E 09/01/2012, 7:23 AM

## 2012-09-01 NOTE — Progress Notes (Signed)
Physical Therapy Note  Patient Details  Name: [name] [name] MRN: [id number] Date of Birth: 11/09/1940 Today's Date: 09/01/2012  1300  Pt refused 60 minutes group physical therapy treatment due to c/o back pain.   Larinda Herter 09/01/2012, 5:28 PM

## 2012-09-01 NOTE — Progress Notes (Signed)
Social Work Patient ID: [name] [name], female   DOB: [birth year], 71 y.o.   MRN: [id number] Met with pt and husband who is here regarding team conference goals-supervision level and discharge 11/7. Husband is here daily and assisting pt in therapies.  Both agreeable to team's recommendations.  Work toward discharge next week.

## 2012-09-02 ENCOUNTER — Inpatient Hospital Stay (HOSPITAL_COMMUNITY): Payer: Medicare Other | Admitting: *Deleted

## 2012-09-02 ENCOUNTER — Inpatient Hospital Stay (HOSPITAL_COMMUNITY): Payer: Medicare Other | Admitting: Speech Pathology

## 2012-09-02 ENCOUNTER — Inpatient Hospital Stay (HOSPITAL_COMMUNITY): Payer: Medicare Other | Admitting: Occupational Therapy

## 2012-09-02 LAB — GLUCOSE, CAPILLARY
Glucose-Capillary: 41 mg/dL — CL (ref 70–99)
Glucose-Capillary: 43 mg/dL — CL (ref 70–99)

## 2012-09-02 LAB — CBC WITH DIFFERENTIAL/PLATELET
Basophils Absolute: 0 10*3/uL (ref 0.0–0.1)
Basophils Relative: 0 % (ref 0–1)
Eosinophils Relative: 1 % (ref 0–5)
HCT: 33.1 % — ABNORMAL LOW (ref 36.0–46.0)
Hemoglobin: 10.5 g/dL — ABNORMAL LOW (ref 12.0–15.0)
Lymphocytes Relative: 17 % (ref 12–46)
MCHC: 31.7 g/dL (ref 30.0–36.0)
MCV: 98.8 fL (ref 78.0–100.0)
Monocytes Absolute: 0.8 10*3/uL (ref 0.1–1.0)
Monocytes Relative: 9 % (ref 3–12)
Neutro Abs: 6.7 10*3/uL (ref 1.7–7.7)
RDW: 12.9 % (ref 11.5–15.5)

## 2012-09-02 LAB — TROPONIN I: Troponin I: <0.3 ng/mL (ref <0.30)

## 2012-09-02 LAB — BASIC METABOLIC PANEL
BUN: 18 mg/dL (ref 6–23)
Calcium: 8.5 mg/dL (ref 8.4–10.5)
Creatinine, Ser: 1.14 mg/dL — ABNORMAL HIGH (ref 0.50–1.10)
GFR calc non Af Amer: 47 mL/min — ABNORMAL LOW (ref >90)

## 2012-09-02 LAB — URINALYSIS, ROUTINE W REFLEX MICROSCOPIC
Bilirubin Urine: NEGATIVE
Ketones, ur: NEGATIVE mg/dL
Leukocytes, UA: NEGATIVE
Nitrite: NEGATIVE
Urobilinogen, UA: 0.2 mg/dL (ref 0.0–1.0)
pH: 7.5 (ref 5.0–8.0)

## 2012-09-02 MED ORDER — METFORMIN HCL ER 500 MG PO TB24
500.0000 mg | ORAL_TABLET | Freq: Every day | ORAL | Status: DC
  Administered 2012-09-02 – 2012-09-03 (×2): 500 mg via ORAL
  Filled 2012-09-02 (×4): qty 1

## 2012-09-02 NOTE — Progress Notes (Signed)
Occupational Therapy Session Note  Patient Details  Name: [name] [name] MRN: [id number] Date of Birth: 07-28-1941  Today's Date: 09/02/2012 Time: 0925-1000 Time Calculation (min): 35 min  Short Term Goals: Week 1:  OT Short Term Goal 1 (Week 1): Pt will complete LB dressing with min assist  OT Short Term Goal 2 (Week 1): Pt will complete grooming in standing with min assist OT Short Term Goal 3 (Week 1): Pt will complete toilet transfer with min assist with min cues for sequencing OT Short Term Goal 4 (Week 1): Pt will complete toileting (hygiene and clothing management) with min/steady assist.  Skilled Therapeutic Interventions/Progress Updates:    Pt seen for ADL retraining with focus on sit <> stand, dynamic standing balance, and attention to Lt body/environment.  RN providing meds and care upon arrival, missed 10 mins secondary to RN care.  Pt refused to bathe at shower level, requesting to complete bathing at sink.  Pt light min/steady assist progressing to supervision in standing with bathing.  Pt demonstrated increased ability to locate items on left side of sink despite cluttered environment.    Therapy Documentation Precautions:  Precautions Precautions: Fall Restrictions Weight Bearing Restrictions: No General: General Amount of Missed OT Time (min): 10 Minutes Vital Signs: Therapy Vitals Temp: 98.2 F (36.8 C) Temp src: Axillary Pain:  Pt with no c/o pain this session.  See FIM for current functional status  Therapy/Group: Individual Therapy  Leonette Monarch 09/02/2012, 12:32 PM

## 2012-09-02 NOTE — Progress Notes (Signed)
Patient ID: [name] [name], female   DOB: 05/17/1941, 71 y.o.   MRN: [id number] Subjective/Complaints: [name] [name] is a 71 y.o. female with history of DM, fall 10 days PTA on 08/18/12 with worsening of HA and 3 day history of running into things on the left with gait problems. Was taking ASA q 6 hours for headaches. MRI head done on outpatient basis with right posterior temporo-occipital hemorrhage and patient admitted for workup. Patient developed seizure on 10/17 pm and was loaded with dilantin and started on Keppra. EEG done revealing moderately severe, continuous nonspecific slowing of cerebral activity with question of encephalopathy v/s post ictal state. She did develop fever with hypotension and patient intubated on 10/18. CCM consulted for input and patient started on IV zosyn briefly for presumed aspiration PNA.   Sleeping but arouses to voice Review of Systems  Unable to perform ROS: mental status change    Objective: Vital Signs: Blood pressure 138/77, pulse 74, temperature 100.2 F (37.9 C), temperature source Oral, resp. rate 17, weight 52 kg (114 lb 10.2 oz), SpO2 99.00%. Dg Swallowing Func-speech Pathology  09/01/2012  Jackalyn Lombard, Student-SLP     09/01/2012  1:55 PM Objective Swallowing Evaluation: Modified Barium Swallowing Study   Patient Details  Name: [name] [name] MRN: [id number] Date of Birth: 12-Feb-1941  Today's Date: 09/01/2012 Time: 0930-1000 Time Calculation (min): 30 min  Past Medical History:  Past Medical History  Diagnosis Date  . Diabetes mellitus   . GERD (gastroesophageal reflux disease)    Past Surgical History: No past surgical history on file. HPI:  71 y/o with h/x of GERD, DM. Admitted to [facility] after MRI showed  subacute R. intraparenchymal bleed in parietal area. CXR showed  mild left lower lobe airspace disease (could represent  atelectisis, raises concern for infection or less likely  aspiration). CT showed acute parenchymal hemorrhage in right  occipital lobe. MBS was completed  08/25/2012, revealing a  mild-moderate oropharyngeal phase dysphagia and diet of Dys. 1  honey-thick liquids was recommended.  Pt admitted to [facility] 08/27/12  with continued presentation of mild-moderate motor and sensory  based dysphagia per results of 08/31/12 BSE which SLP suspects  was exacerbated by recent extubation.  SLP feels MBS is warranted  at this time to objectively determine the presence of  aspiration/penetration.       Recommendation/Prognosis  Clinical Impression Clinical impression: MBS completed today to objectively determine  presence of aspiration/penetration.  Patient's diet upgraded to  Dys. 2, thin liquids due to improvements in oropharyngeal swallow  function compared to previous MBS 08/25/12.  Oral and pharyngeal  phases of swallow exhibit both motor and sensory impairments.  Pt  continues to exhibit reduced tongue base retraction and laryngeal  elevation resulting in moderate vallecular and mild-moderate  pyriform sinus residue with nectar, honey, and thin  consistencies. Of note, soft palate exhibited increased  repetitive movement during velopharyngeal port closure which SLP  suspects is a compensatory mechanism for pt's reduced base of  tongue retraction.   Pt exhibits delayed A-P transit and  prolonged mastication of bolus during oral phase on regular  textures and moderate residue at the level of the vallecula as  well as the pyriform sinuses which was cleared to mild-trace with  thin liquid wash.  Pt also reflexively performs approximately 3  swallows per sip/bite and utilizes intermittent throat clear with  Max A verbal and visual cuing which reduces pharyngeal residue to  mild-trace. Overall, pt exhibited no s/s of  penetration/aspiration during this study,  and as a result of this  and other clinical indicators, pt's aspiration risk is  mild-moderate.  ST will continue to follow for safety efficiency  and continued pt/family education.   Swallow Evaluation Recommendations Diet  Recommendations: Dysphagia 2 (Fine chop);Thin liquid Liquid Administration via: Cup;Straw Medication Administration: Whole meds with puree Supervision: Full supervision/cueing for compensatory  strategies;Patient able to self feed Compensations: Slow rate;Small sips/bites;Multiple dry swallows  after each bite/sip;Follow solids with liquid;Clear throat  intermittently;Effortful swallow Postural Changes and/or Swallow Maneuvers: Seated upright 90  degrees Oral Care Recommendations: Oral care BID Follow up Recommendations: 24 hour supervision/assistance Prognosis Prognosis for Safe Diet Advancement: Good Barriers to Reach Goals: Cognitive deficits Individuals Consulted Consulted and Agree with Results and Recommendations:  Patient;Family member/caregiver Family Member Consulted: husband  SLP Assessment/Plan Clinical impression: MBS completed today to objectively determine  presence of aspiration/penetration.  Patient's diet upgraded to  Dys. 2, thin liquids due to improvements in oropharyngeal swallow  function compared to previous MBS 08/25/12.  Oral and pharyngeal  phases of swallow exhibit both motor and sensory impairments.  Pt  continues to exhibit reduced tongue base retraction and laryngeal  elevation resulting in moderate vallecular and mild-moderate  pyriform sinus residue with nectar, honey, and thin  consistencies. Of note, soft palate exhibited increased  repetitive movement during velopharyngeal port closure which SLP  suspects is a compensatory mechanism for pt's reduced base of  tongue retraction.   Pt exhibits delayed A-P transit and  prolonged mastication of bolus during oral phase on regular  textures and moderate residue at the level of the vallecula as  well as the pyriform sinuses which was cleared to mild-trace with  thin liquid wash.  Pt also reflexively performs approximately 3  swallows per sip/bite and utilizes intermittent throat clear with  Max A verbal and visual cuing which reduces  pharyngeal residue to  mild-trace. Overall, pt exhibited no s/s of  penetration/aspiration during this study, and as a result of this  and other clinical indicators, pt's aspiration risk is  mild-moderate.  ST will continue to follow for safety efficiency  and continued pt/family education.     General:  Date of Onset: 08/18/12 HPI: 71 y/o with h/x of GERD, DM. Admitted to [facility] after MRI showed  subacute R. intraparenchymal bleed in parietal area. CXR showed  mild left lower lobe airspace disease (could represent  atelectisis, raises concern for infection or less likely  aspiration). CT showed acute parenchymal hemorrhage in right  occipital lobe. MBS was completed 08/25/2012, revealing a  mild-moderate oropharyngeal phase dysphagia and diet of Dys. 1  honey-thick liquids was recommended.  Pt admitted to [facility] 08/27/12  with continued presentation of mild-moderate motor and sensory  based dysphagia per results of 08/31/12 BSE which SLP suspects  was exacerbated by recent extubation.  SLP feels MBS is warranted  at this time to objectively determine the presence of  aspiration/penetration.   Type of Study: Modified Barium Swallowing Study Reason for Referral: Objectively evaluate swallowing function Previous Swallow Assessment: BSE 08/28/12 Diet Prior to this Study: Dysphagia 1 (puree);Honey-thick liquids Temperature Spikes Noted: No Respiratory Status: Room air History of Recent Intubation: Yes Length of Intubations (days): 2 days Date extubated: 08/21/12 Behavior/Cognition: Alert;Cooperative;Pleasant mood;Hard of  hearing Oral Cavity - Dentition: Adequate natural dentition Self-Feeding Abilities: Needs set up;Able to feed self Patient Positioning: Upright in chair Baseline Vocal Quality: Low vocal intensity Volitional Cough: Strong Volitional Swallow: Able to elicit Anatomy: Within functional limits Pharyngeal Secretions: Not observed  secondary MBS  Reason for Referral:  Objectively evaluate swallowing function   Oral  Phase Oral Preparation/Oral Phase Oral Phase: Impaired Oral - Honey Oral - Honey Teaspoon: Not tested Oral - Honey Cup: Weak lingual manipulation Oral - Nectar Oral - Nectar Teaspoon: Not tested Oral - Nectar Cup: Weak lingual manipulation Oral - Thin Oral - Thin Teaspoon: Not tested Oral - Thin Cup: Weak lingual manipulation Oral - Thin Straw: Weak lingual manipulation Oral - Solids Oral - Regular: Impaired mastication;Weak lingual  manipulation;Reduced posterior propulsion;Delayed oral transit Pharyngeal Phase  Pharyngeal Phase Pharyngeal Phase: Impaired Pharyngeal - Honey Pharyngeal - Honey Cup: Delayed swallow initiation;Premature  spillage to valleculae;Reduced laryngeal elevation;Reduced tongue  base retraction;Pharyngeal residue - pyriform sinuses;Pharyngeal  residue - valleculae Pharyngeal - Nectar Pharyngeal - Nectar Cup: Delayed swallow initiation;Premature  spillage to valleculae;Reduced laryngeal elevation;Reduced tongue  base retraction;Pharyngeal residue - pyriform sinuses;Pharyngeal  residue - valleculae Pharyngeal - Thin Pharyngeal - Thin Cup: Delayed swallow initiation;Premature  spillage to valleculae;Reduced laryngeal elevation;Reduced tongue  base retraction;Pharyngeal residue - pyriform sinuses;Pharyngeal  residue - valleculae Pharyngeal - Thin Straw: Delayed swallow initiation;Premature  spillage to pyriform sinuses;Reduced laryngeal elevation;Reduced  tongue base retraction;Pharyngeal residue - valleculae;Pharyngeal  residue - pyriform sinuses Pharyngeal - Solids Pharyngeal - Regular: Delayed swallow initiation;Premature  spillage to valleculae;Pharyngeal residue - valleculae;Pharyngeal  residue - pyriform sinuses;Reduced tongue base retraction;Reduced  laryngeal elevation Cervical Esophageal Phase  Cervical Esophageal Phase Cervical Esophageal Phase: [facility]  Jackalyn Lombard, B.A.  Graduate Clinician Speech Language Pathology  Page, Joni Reining 09/01/2012, 1:51 PM      Results for orders placed during  the hospital encounter of 08/27/12 (from the past 72 hour(s))  CBC WITH DIFFERENTIAL     Status: Abnormal   Collection Time   08/30/12  8:45 AM      Component Value Range Comment   WBC 13.0 (*) 4.0 - 10.5 K/uL    RBC 3.68 (*) 3.87 - 5.11 MIL/uL    Hemoglobin 11.6 (*) 12.0 - 15.0 g/dL    HCT 96.0 (*) 45.4 - 46.0 %    MCV 97.3  78.0 - 100.0 fL    MCH 31.5  26.0 - 34.0 pg    MCHC 32.4  30.0 - 36.0 g/dL    RDW 09.8  11.9 - 14.7 %    Platelets 506 (*) 150 - 400 K/uL    Neutrophils Relative 65  43 - 77 %    Neutro Abs 8.4 (*) 1.7 - 7.7 K/uL    Lymphocytes Relative 26  12 - 46 %    Lymphs Abs 3.3  0.7 - 4.0 K/uL    Monocytes Relative 7  3 - 12 %    Monocytes Absolute 0.9  0.1 - 1.0 K/uL    Eosinophils Relative 2  0 - 5 %    Eosinophils Absolute 0.3  0.0 - 0.7 K/uL    Basophils Relative 0  0 - 1 %    Basophils Absolute 0.1  0.0 - 0.1 K/uL   COMPREHENSIVE METABOLIC PANEL     Status: Abnormal   Collection Time   08/30/12  8:45 AM      Component Value Range Comment   Sodium 139  135 - 145 mEq/L    Potassium 4.0  3.5 - 5.1 mEq/L    Chloride 97  96 - 112 mEq/L    CO2 33 (*) 19 - 32 mEq/L    Glucose, Bld 272 (*) 70 - 99 mg/dL    BUN 26 (*) 6 -  23 mg/dL    Creatinine, Ser 4.09 (*) 0.50 - 1.10 mg/dL    Calcium 8.1 (*) 8.4 - 10.5 mg/dL    Total Protein 6.4  6.0 - 8.3 g/dL    Albumin 2.3 (*) 3.5 - 5.2 g/dL    AST 36  0 - 37 U/L    ALT 33  0 - 35 U/L    Alkaline Phosphatase 126 (*) 39 - 117 U/L    Total Bilirubin 0.2 (*) 0.3 - 1.2 mg/dL    GFR calc non Af Amer 48 (*) >90 mL/min    GFR calc Af Amer 56 (*) >90 mL/min   GLUCOSE, CAPILLARY     Status: Abnormal   Collection Time   08/30/12 11:13 AM      Component Value Range Comment   Glucose-Capillary 384 (*) 70 - 99 mg/dL    Comment 1 Notify RN     GLUCOSE, CAPILLARY     Status: Abnormal   Collection Time   08/30/12  1:55 PM      Component Value Range Comment   Glucose-Capillary 162 (*) 70 - 99 mg/dL    Comment 1 Notify RN       GLUCOSE, CAPILLARY     Status: Abnormal   Collection Time   08/30/12  4:53 PM      Component Value Range Comment   Glucose-Capillary 122 (*) 70 - 99 mg/dL    Comment 1 Documented in Chart      Comment 2 Notify RN     BASIC METABOLIC PANEL     Status: Abnormal   Collection Time   08/31/12  7:00 AM      Component Value Range Comment   Sodium 142  135 - 145 mEq/L    Potassium 4.3  3.5 - 5.1 mEq/L    Chloride 100  96 - 112 mEq/L    CO2 32  19 - 32 mEq/L    Glucose, Bld 178 (*) 70 - 99 mg/dL    BUN 24 (*) 6 - 23 mg/dL    Creatinine, Ser 8.11  0.50 - 1.10 mg/dL    Calcium 7.8 (*) 8.4 - 10.5 mg/dL    GFR calc non Af Amer 52 (*) >90 mL/min    GFR calc Af Amer 60 (*) >90 mL/min   GLUCOSE, CAPILLARY     Status: Abnormal   Collection Time   08/31/12  7:08 AM      Component Value Range Comment   Glucose-Capillary 161 (*) 70 - 99 mg/dL    Comment 1 Notify RN     GLUCOSE, CAPILLARY     Status: Abnormal   Collection Time   08/31/12 11:11 AM      Component Value Range Comment   Glucose-Capillary 349 (*) 70 - 99 mg/dL    Comment 1 Notify RN     GLUCOSE, CAPILLARY     Status: Normal   Collection Time   08/31/12  5:03 PM      Component Value Range Comment   Glucose-Capillary 70  70 - 99 mg/dL   GLUCOSE, CAPILLARY     Status: Normal   Collection Time   08/31/12 10:15 PM      Component Value Range Comment   Glucose-Capillary 94  70 - 99 mg/dL   GLUCOSE, CAPILLARY     Status: Abnormal   Collection Time   09/01/12  7:11 AM      Component Value Range Comment   Glucose-Capillary 125 (*) 70 - 99  mg/dL    Comment 1 Notify RN     GLUCOSE, CAPILLARY     Status: Abnormal   Collection Time   09/01/12 11:23 AM      Component Value Range Comment   Glucose-Capillary 254 (*) 70 - 99 mg/dL    Comment 1 Notify RN     GLUCOSE, CAPILLARY     Status: Abnormal   Collection Time   09/01/12 12:03 PM      Component Value Range Comment   Glucose-Capillary 259 (*) 70 - 99 mg/dL    Comment 1 Notify RN      TROPONIN I     Status: Normal   Collection Time   09/01/12  1:30 PM      Component Value Range Comment   Troponin I <0.30  <0.30 ng/mL   GLUCOSE, CAPILLARY     Status: Normal   Collection Time   09/01/12  4:22 PM      Component Value Range Comment   Glucose-Capillary 90  70 - 99 mg/dL    Comment 1 Notify RN     TROPONIN I     Status: Normal   Collection Time   09/01/12  6:14 PM      Component Value Range Comment   Troponin I <0.30  <0.30 ng/mL   GLUCOSE, CAPILLARY     Status: Abnormal   Collection Time   09/01/12  8:44 PM      Component Value Range Comment   Glucose-Capillary 231 (*) 70 - 99 mg/dL    Comment 1 Notify RN     TROPONIN I     Status: Normal   Collection Time   09/02/12  1:16 AM      Component Value Range Comment   Troponin I <0.30  <0.30 ng/mL      HEENT: normal Cardio: RRR Resp: CTA B/L GI: BS positive Extremity:  Pulses positive Skin:   Intact Neuro: Awake and alert, Cranial Nerve II-XII normal, Abnormal Sensory reduced on L , Normal Motor, Abnormal FMC Ataxic/ dec FMC, Inattention on L mild, apraxia Musc/Skel:  Normal Oriented to person place and situation   Assessment/Plan: 1. Functional deficits secondary to R temporo-occipital ICH which require 3+ hours per day of interdisciplinary therapy in a comprehensive inpatient rehab setting. Physiatrist is providing close team supervision and 24 hour management of active medical problems listed below. Physiatrist and rehab team continue to assess barriers to discharge/monitor patient progress toward functional and medical goals. FIM: FIM - Bathing Bathing Steps Patient Completed: Chest;Right Arm;Left Arm;Abdomen;Front perineal area;Buttocks;Right upper leg;Left upper leg;Right lower leg (including foot);Left lower leg (including foot) Bathing: 4: Steadying assist  FIM - Upper Body Dressing/Undressing Upper body dressing/undressing steps patient completed: Thread/unthread right sleeve of pullover  shirt/dresss;Thread/unthread left sleeve of pullover shirt/dress;Put head through opening of pull over shirt/dress;Pull shirt over trunk Upper body dressing/undressing: 5: Supervision: Safety issues/verbal cues FIM - Lower Body Dressing/Undressing Lower body dressing/undressing steps patient completed: Thread/unthread right underwear leg;Thread/unthread left underwear leg;Pull underwear up/down;Thread/unthread right pants leg;Thread/unthread left pants leg;Pull pants up/down;Don/Doff right shoe;Don/Doff left shoe;Fasten/unfasten right shoe;Fasten/unfasten left shoe Lower body dressing/undressing: 4: Steadying Assist  FIM - Toileting Toileting steps completed by patient: Adjust clothing prior to toileting;Adjust clothing after toileting;Performs perineal hygiene Toileting Assistive Devices: Grab bar or rail for support Toileting: 4: Steadying assist  FIM - Diplomatic Services operational officer Devices: Elevated toilet seat Toilet Transfers: 4-To toilet/BSC: Min A (steadying Pt. > 75%);4-From toilet/BSC: Min A (steadying Pt. > 75%)  FIM - Banker Devices: Therapist, occupational: 4: Supine > Sit: Min A (steadying Pt. > 75%/lift 1 leg);4: Sit > Supine: Min A (steadying pt. > 75%/lift 1 leg);4: Bed > Chair or W/C: Min A (steadying Pt. > 75%);4: Chair or W/C > Bed: Min A (steadying Pt. > 75%)  FIM - Locomotion: Wheelchair Distance: 150 Locomotion: Wheelchair: 3: Travels 150 ft or more: maneuvers on rugs and over door sills with moderate assistance  (Pt: 50 - 74%) FIM - Locomotion: Ambulation Locomotion: Ambulation Assistive Devices: Designer, industrial/product Ambulation/Gait Assistance: 4: Min assist Locomotion: Ambulation: 1: Travels less than 50 ft with minimal assistance (Pt.>75%)  Comprehension Comprehension Mode: Auditory Comprehension: 4-Understands basic 75 - 89% of the time/requires cueing 10 - 24% of the time  Expression Expression Mode:  Verbal Expression: 5-Expresses basic 90% of the time/requires cueing < 10% of the time.  Social Interaction Social Interaction: 5-Interacts appropriately 90% of the time - Needs monitoring or encouragement for participation or interaction.  Problem Solving Problem Solving: 3-Solves basic 50 - 74% of the time/requires cueing 25 - 49% of the time  Memory Memory: 3-Recognizes or recalls 50 - 74% of the time/requires cueing 25 - 49% of the time   Medical Problem List and Plan:  1. DVT Prophylaxis/Anticoagulation: Mechanical: Sequential compression devices, below knee Bilateral lower extremities  2. Pain Management: N/A  3. Mood: difficult to judge at this time due to impaired cognition and poor awareness.  4. Neuropsych: This patient is not capable of making decisions on his/her own behalf.  5. DM type 2 insulin dependent : BS have been poorly controlled. Continue detemir with meal coverage and SSI. Monitor CBG AC/HS checks.  6. Orthostasis: will check ortho static BP in am and set parameters for BP meds.  7. New onset seizures: Continue keppra 250 mg bid. Monitor for recurrence. No signs thus far 8. Resting tachycardia: likely due to deconditioning. Will monitor with bid checks and patient symptoms.  9. Hypokalemia: likely dilutional due to IVF supplement and recheck 10/28.  10. ABLA: continue to monitor for trends. Recheck 10/28.  11. Reactive leucocytosis due to aspiration PNA: Resolving. BC/UC negative. Check follow up labs on 10/28.  12. Dysphagia: Will nocturnal IVF.D2 thin 13.  Pneumonia vs pleural effusion, lungs sound ok but Xray shows LLL fluid.  Afebrile now on Avelox 13.  GERD, on protonix, troponins neg LOS (Days) 6 A FACE TO FACE EVALUATION WAS PERFORMED  KIRSTEINS,ANDREW E 09/02/2012, 7:20 AM

## 2012-09-02 NOTE — Progress Notes (Signed)
Physical Therapy Session Note  Patient Details  Name: Joscelin Fray MRN: [id number] Date of Birth: [birth year]  Today's Date: 09/02/2012 Time: 1303-1406 Time Calculation (min): 63 min  Short Term Goals: Week 1:  PT Short Term Goal 1 (Week 1): = LTG  Skilled Therapeutic Interventions/Progress Updates:   No c/o pain. Pt participated in group therapy session for dynamic gait and standing balance. Standing tennis game with balloon with min assist  for balance with 1 UE support. Gait training household mobility and obstacle negotiation with RW and without with min assist. Pt tends to run into objects on left.  Needs simple cues for tasks.    Therapy Documentation Precautions:  Precautions Precautions: Fall Restrictions Weight Bearing Restrictions: No    Pain: Pain Assessment Pain Assessment: No/denies pain Mobility:   Locomotion : Ambulation Ambulation/Gait Assistance: 4: Min assist Wheelchair Mobility Distance: 150     See FIM for current functional status  Therapy/Group: Group Therapy  Newell Coral 09/02/2012, 2:17 PM

## 2012-09-02 NOTE — Plan of Care (Signed)
Problem: RH PAIN MANAGEMENT Goal: RH STG PAIN MANAGED AT OR BELOW PT'S PAIN GOAL 2/10  Outcome: Progressing No c/o pain     

## 2012-09-02 NOTE — Patient Care Conference (Signed)
Inpatient RehabilitationTeam Conference Note Date: 09/01/2012   Time: 10:50 AM    Patient Name: [name] [name]      Medical Record Number: [id number]  Date of Birth: 1941-03-06 Sex: Female         Room/Bed: 4005/4005-01 Payor Info: Payor: MEDICARE  Plan: MEDICARE PART A  Product Type: *No Product type*     Admitting Diagnosis: TEMPORAL OCCIPAL HEMORRHAGE  Admit Date/Time:  08/27/2012  4:15 PM Admission Comments: No comment available   Primary Diagnosis:  Hemorrhage in the brain Principal Problem: Hemorrhage in the brain  Patient Active Problem List   Diagnosis Date Noted  . Acute respiratory failure with hypoxia 08/20/2012  . Seizure disorder, grand mal 08/20/2012  . Seizure 08/20/2012  . CKD (chronic kidney disease) stage 3, GFR 30-59 ml/min 08/19/2012  . Leukocytosis 08/19/2012  . Hemorrhage in the brain-13 x 22 x 14 mm right posterior temporo-occipital intra-axial acute 08/19/2012  . Left hip pain 08/19/2012  . Left hemiparesis 08/19/2012  . Hemianopia, homonymous, left 08/19/2012  . Hemisensory deficit, left 08/19/2012  . Nausea 08/19/2012  . Headache 08/19/2012  . Encephalopathy acute 08/19/2012  . Hemorrhage of brain, nontraumatic 08/18/2012  . Dysphagia 08/18/2012  . HTN (hypertension) 08/18/2012  . DM type 2 (diabetes mellitus, type 2) 07/26/2012  . Hypoglycemia due to insulin 07/26/2012  . GERD (gastroesophageal reflux disease)     Expected Discharge Date: Expected Discharge Date: 09/09/12  Team Members Present: Physician: Dr. Claudette Laws Social Worker Present: Dossie Der, LCSW Nurse Present: Other (comment) Ashok Cordia Turner-RN) PT Present: Edman Circle, PT;Caroline Adriana Simas, PT OT Present: Leonette Monarch, OT SLP Present: Fae Pippin, SLP Other (Discipline and Name): Jacqlyn Larsen Coordinator     Current Status/Progress Goal Weekly Team Focus  Medical   LLL pneumonia on ABX, poor energy level, inconsistant visuo-perceptual awareness  Improve endurance   complete abx treatment   Bowel/Bladder             Swallow/Nutrition/ Hydration   Dys.2 textures and thin liquids with full supervision to utilize 2-3 swallows per bite/sip  least restrictive p.o. intake  carryover use of ocmpensatory strategies and initiate pharyngeal strengthing exercises   ADL's   min/steady assist with bathing and dressing, min-mod assist transfers, fluctuating Lt inattention  supervision overall  Lt attention, dynamic standing balance, transfers   Mobility   min-mod A overall; L inattention  supervision overall  activity tolerance, attention to L, balance, gait   Communication             Safety/Cognition/ Behavioral Observations  mod assist   min assist   increase carryover and use of compensatory strategies   Pain             Skin                *See Interdisciplinary Assessment and Plan and progress notes for long and short-term goals  Barriers to Discharge: will need 24/7 care    Possible Resolutions to Barriers:  train husband    Discharge Planning/Teaching Needs:  Home with husband and daughter who can provide assistance.  Husband was here to observe in therapies      Team Discussion:  MBS-upgraded diet-dys2 thin. Checking BP's, making good progress. Husband here daily to assist  Revisions to Treatment Plan:  None   Continued Need for Acute Rehabilitation Level of Care: The patient requires daily medical management by a physician with specialized training in physical medicine and rehabilitation for the following conditions: Daily direction of a  multidisciplinary physical rehabilitation program to ensure safe treatment while eliciting the highest outcome that is of practical value to the patient.: Yes Daily medical management of patient stability for increased activity during participation in an intensive rehabilitation regime.: Yes Daily analysis of laboratory values and/or radiology reports with any subsequent need for medication adjustment of  medical intervention for : Neurological problems  Jariah Jarmon, Lemar Livings 09/02/2012, 9:08 AM

## 2012-09-02 NOTE — Significant Event (Signed)
Hypoglycemic Event  CBG:41  Treatment: 15 GM carbohydrate snack  Symptoms: Pale  Follow-up CBG: Time:1625 CBG Result:43  Possible Reasons for Event: Inadequate meal intake  Comments/MD notified:Pam Love, PA  [id number]. [phone]- BG [id number] Fayne Mcguffee, Buford Dresser  Remember to initiate Hypoglycemia Order Set & complete

## 2012-09-02 NOTE — Progress Notes (Signed)
Physical Therapy Session Note  Patient Details  Name: [name] [name] MRN: [id number] Date of Birth: 1940/11/05  Today's Date: 09/02/2012 Time:  11:03-12:03 ( )   Short Term Goals: Week 1:  PT Short Term Goal 1 (Week 1): = LTG   Skilled Therapeutic Interventions/Progress Updates:  Tx focused on WC mobility, gait training in controlled and home environments with RW, and LE strengthening.   WC propulsion 2x150' in controlled environment with up to Mod A needed to avoid obstacles on L.   Stand-pivot transfers x4 with min A for steadying and cues for safe hand placement. Pt with zero carryover with verbal cues, needing hand-over-hand placement initially, able to return demo twice.   Gait training 2x120' in busy, controlled environment, focusing on navigating obstacles prior to collision without cues to encourage learning. Pt given cues for visual scanning.  Min A needed for steadying. Gait 2x30' in home environment on carpet around furniture with Min A. Furniture transfers with close S and cues for hand placement with multiple attempts.     Dynamic balance training without UE support and up to Mod A side-stepping and retro walking x20' in each direction, very slow pace.   Nu step x91min level 3 with bil UE and LE for strengthening and activity tolerance. Pt challenged to target 50spm avg, and was able to maintain 45spm consistently. No rest break needed.   Therapy Documentation Precautions:  Precautions Precautions: Fall Restrictions Weight Bearing Restrictions: No  Pain: None      Locomotion : Ambulation Ambulation/Gait Assistance: 4: Min assist Wheelchair Mobility Distance: 150   See FIM for current functional status  Therapy/Group: Individual Therapy  Virl Cagey, PT 09/02/2012, 11:40 AM

## 2012-09-02 NOTE — Progress Notes (Signed)
The skilled treatment note has been reviewed and SLP is in agreement. Octaviano Mukai, M.A., CCC-SLP [phone]  

## 2012-09-02 NOTE — Progress Notes (Signed)
Speech Language Pathology Daily Session Note  Patient Details  Name: [name] [name] MRN: [id number] Date of Birth: 10/30/1941  Today's Date: 09/02/2012 Time: 4098-1191 Time Calculation (min): 45 min  Short Term Goals: Week 1: SLP Short Term Goal 1 (Week 1): Patient will tolerate Dys. 2 textures and thin liquids via teaspoon with mod assist verbal cues for use of compensatory strategies. SLP Short Term Goal 2 (Week 1): Patient will tolerate an objective swallow study to determine presence of aspiration/penetration with min assist.   SLP Short Term Goal 3 (Week 1): Patient will sustain attention for 5 minutes during a functional task with mod assist verbal cues for redirection.   SLP Short Term Goal 3 - Progress (Week 1): Progressing toward goal SLP Short Term Goal 4 (Week 1): Patient will utilize compensatory strategies to facilitate increased recall of new information with mod-max assist verbal cues.   SLP Short Term Goal 4 - Progress (Week 1): Progressing toward goal SLP Short Term Goal 5 (Week 1): Patient will solve basic problems during a familiar task with mod assist verbal cues.  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 SLP Short Term Goal 6 - Progress (Week 1): Progressing toward goal  Skilled Therapeutic Interventions: Treatment session addressed dysphagia management.  Patient consumed Dys. 2 textures and thin liquids with Mod faded to Min A verbal cuing for utilization of safe swallowing strategies.  Patient exhibited intermittent wet vocal quality which patient cleared with Mod-Max A verbal cues for utilization of throat clear.  Patient completed pharyngeal strengthening exercises to improve hyolaryngeal excursion per results of 10/30 MBS with Max to Total A.    Patient was unable to accurately perform breath  holding exercises with an inspirometer due to cognitive limitations which impacted her ability to follow multi-step directions and, as a result, SLP facilitated the session with Mod-Max A to complete deep breathing exercises.     FIM:  Comprehension Comprehension Mode: Auditory Comprehension: 4-Understands basic 75 - 89% of the time/requires cueing 10 - 24% of the time Expression Expression Mode: Verbal Expression: 5-Expresses basic 90% of the time/requires cueing < 10% of the time. Social Interaction Social Interaction: 6-Interacts appropriately with others with medication or extra time (anti-anxiety, antidepressant). Problem Solving Problem Solving: 3-Solves basic 50 - 74% of the time/requires cueing 25 - 49% of the time Memory Memory: 3-Recognizes or recalls 50 - 74% of the time/requires cueing 25 - 49% of the time FIM - Eating Eating Activity: 5: Set-up assist for open containers;5: Needs verbal cues/supervision  Pain Pain Assessment Pain Assessment: No/denies pain  Therapy/Group: Individual Therapy  Jackalyn Lombard, [place]  Graduate Clinician Speech Language Pathology   Dorell Gatlin, Joni Reining 09/02/2012, 1:35 PM

## 2012-09-03 ENCOUNTER — Inpatient Hospital Stay (HOSPITAL_COMMUNITY): Payer: Medicare Other | Admitting: Speech Pathology

## 2012-09-03 ENCOUNTER — Inpatient Hospital Stay (HOSPITAL_COMMUNITY): Payer: Medicare Other | Admitting: Occupational Therapy

## 2012-09-03 ENCOUNTER — Inpatient Hospital Stay (HOSPITAL_COMMUNITY): Payer: Medicare Other

## 2012-09-03 ENCOUNTER — Inpatient Hospital Stay (HOSPITAL_COMMUNITY): Payer: Medicare Other | Admitting: Physical Therapy

## 2012-09-03 DIAGNOSIS — I69991 Dysphagia following unspecified cerebrovascular disease: Secondary | ICD-10-CM

## 2012-09-03 DIAGNOSIS — I619 Nontraumatic intracerebral hemorrhage, unspecified: Secondary | ICD-10-CM

## 2012-09-03 DIAGNOSIS — R569 Unspecified convulsions: Secondary | ICD-10-CM

## 2012-09-03 LAB — GLUCOSE, CAPILLARY
Glucose-Capillary: 153 mg/dL — ABNORMAL HIGH (ref 70–99)
Glucose-Capillary: 236 mg/dL — ABNORMAL HIGH (ref 70–99)
Glucose-Capillary: 89 mg/dL (ref 70–99)

## 2012-09-03 NOTE — Progress Notes (Signed)
Occupational Therapy Weekly Progress Note and Treatment Session Note  Patient Details  Name: [name] [name] MRN: [id number] Date of Birth: 28-Apr-1941  Today's Date: 09/03/2012 Time: 1000-1045 Time Calculation (min): 45 min  Patient has met 4 of 4 short term goals.  Pt is demonstrating increased attention to self-care tasks and ability to complete sit <> stand for bathing and dressing purposes.  Pt responds well to short, simple instructions and familiar tasks to improve processing.    Patient continues to demonstrate the following deficits: Lt visual deficits (inattention, depth perception), cognitive deficits, delayed processing, decreased initiation and sequencing, decreased activity tolerance, and impaired functional ambulation and therefore will continue to benefit from skilled OT intervention to enhance overall performance with BADL, iADL and Reduce care partner burden.  Patient progressing toward long term goals..  Continue plan of care.  OT Short Term Goals Week 1:  OT Short Term Goal 1 (Week 1): Pt will complete LB dressing with min assist  OT Short Term Goal 1 - Progress (Week 1): Met OT Short Term Goal 2 (Week 1): Pt will complete grooming in standing with min assist OT Short Term Goal 2 - Progress (Week 1): Met OT Short Term Goal 3 (Week 1): Pt will complete toilet transfer with min assist with min cues for sequencing OT Short Term Goal 3 - Progress (Week 1): Met OT Short Term Goal 4 (Week 1): Pt will complete toileting (hygiene and clothing management) with min/steady assist. OT Short Term Goal 4 - Progress (Week 1): Met Week 2:  OT Short Term Goal 1 (Week 2): STG = LTGs due to remaining LOS  Skilled Therapeutic Interventions/Progress Updates:    Pt seen for 1:1 OT with focus on functional mobility and transfers with RW, initiation and sequencing with familiar task of bathing and dressing, and Lt attention and scanning with grooming to retrieve objects.  Upon entering room, pt  reported needing to toilet, ambulated with min assist with RW to bathroom with no inattention to Lt (bathroom on Lt).  At sink for grooming, pt unable to locate soap or washcloth on Lt side of sink requiring mod-max verbal and questioning cues.  Depth perception deficits increasingly obvious with putting toothpaste on toothbrush and toothbrush under running water.  Conducted tub/shower transfer with min assist and use of grab bars, stepping over ledge.  Pt reports no grab bars at home.  Planned to have pt sit in tub as she reports taking a bath PTA, however pt became increasingly confused in bathroom and contradicted herself with each question regarding bathing.  Suggest shower chair for increased safety and activity tolerance upon d/c.  Therapy Documentation Precautions:  Precautions Precautions: Fall Restrictions Weight Bearing Restrictions: No Pain: Pain Assessment Pain Assessment: Faces Pain Score: 0-No pain Faces Pain Scale: Hurts little more Pain Location: Back Pain Intervention(s): RN made aware;Repositioned ADL: ADL Eating: Set up;Minimal cueing Where Assessed-Eating: Edge of bed Grooming: Moderate cueing;Setup Where Assessed-Grooming: Standing at sink Upper Body Bathing: Minimal cueing;Supervision/safety Where Assessed-Upper Body Bathing: Sitting at sink Lower Body Bathing: Minimal assistance;Minimal cueing Where Assessed-Lower Body Bathing: Sitting at sink;Standing at sink Upper Body Dressing: Supervision/safety;Setup Where Assessed-Upper Body Dressing: Sitting at sink Lower Body Dressing: Supervision/safety;Setup Where Assessed-Lower Body Dressing: Sitting at sink;Standing at sink Toileting: Minimal assistance Where Assessed-Toileting: Teacher, adult education: Curator Method: Proofreader: Engineer, technical sales: Moderate cueing;Minimal assistance Tub/Shower Transfer Method: Ambulating Tub/Shower Equipment: Grab  bars ADL Comments: Pt with severe visual disturbances (Lt inattention/neglect, depth  perception deficits) which are negatively effecting her ability to complete self-care tasks safely.  See FIM for current functional status  Therapy/Group: Individual Therapy  Leonette Monarch 09/03/2012, 10:50 AM

## 2012-09-03 NOTE — Progress Notes (Deleted)
Social Work Patient ID: [name] [name], female   DOB: 07-10-1941, 71 y.o.   MRN: [id number] Met with pt and wife who was here for family education.  Went well and wife feels better about discharge Sat. She also will have her son help pt into the home.  Encouraged them to also have pt's son there to assist into the house. Home health arranged and DME delivered.  [facility] coming out Sat to assess ramp and hopefully build next week.

## 2012-09-03 NOTE — Progress Notes (Signed)
Speech Language Pathology Daily Session Note  Patient Details  Name: [name] [name] MRN: [id number] Date of Birth: 1940/11/23  Today's Date: 09/03/2012 Time: 0830-0910 Time Calculation (min): 40 min  Short Term Goals: Week 1: SLP Short Term Goal 1 (Week 1): Patient will tolerate Dys. 2 textures and thin liquids via teaspoon with mod assist verbal cues for use of compensatory strategies. SLP Short Term Goal 1 - Progress (Week 1): Progressing toward goal SLP Short Term Goal 2 (Week 1): Patient will tolerate an objective swallow study to determine presence of aspiration/penetration with min assist.   SLP Short Term Goal 2 - Progress (Week 1): Progressing toward goal SLP Short Term Goal 3 (Week 1): Patient will sustain attention for 5 minutes during a functional task with mod assist verbal cues for redirection.   SLP Short Term Goal 3 - Progress (Week 1): Progressing toward goal SLP Short Term Goal 4 (Week 1): Patient will utilize compensatory strategies to facilitate increased recall of new information with mod-max assist verbal cues.   SLP Short Term Goal 4 - Progress (Week 1): Progressing toward goal SLP Short Term Goal 5 (Week 1): Patient will solve basic problems during a familiar task with mod assist verbal cues.  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 SLP Short Term Goal 6 - Progress (Week 1): Progressing toward goal  Skilled Therapeutic Interventions: Pt. seen for cognitive therapy this am with breakfast meal.  As SLP entered room pt. coughing with wet vocal quality while taking meds with RN.  Nurse reported pt. coughing with liquid meds from cup.  Pt. very impulsive and required max-total verbal/visual/tactile during session to slow down and swallow twice/throat clear.  Minimal s/s aspiration throughout meal with  intermittent wet vocal quality and delayed throat clear.  Mod-max multi-modal cues given for basic problem solving (using correct utensils, removing top prior to drinking juice etc.  Cognitive difficutly is exacerbated by pt.'s visual impairments/visual disorganization.         FIM:  FIM - Eating Eating Activity: 5: Set-up assist for open containers  Pain Pain Assessment Pain Assessment: No/denies pain Faces Pain Scale: Hurts little more Pain Location: Back Pain Intervention(s): RN made aware;Repositioned  Therapy/Group: Individual Therapy  Royce Macadamia 09/03/2012, 12:40 PM

## 2012-09-03 NOTE — Progress Notes (Signed)
Physical Therapy Weekly Progress Note  Patient Details  Name: [name] [name] MRN: [id number] Date of Birth: December 08, 1940  Today's Date: 09/03/2012 Time: 1100-1158 and 0865-7846 Time Calculation (min): 58 min and 48 min  Patient is making steady progress towards goals and has met 0 of 8 long term goals.  Short term goals not set due to estimated length of stay.  Patient is currently min-mod A overall for bed mobility, bed <> w/c transfers, stair negotiation with RW but continues to require max-total verbal and visual cues to attend to L visual field and L side during gait and functional mobility for safety.    Patient continues to demonstrate the following deficits: impaired activity tolerance/endurance, impaired initiation, impaired awareness of deficits, apraxia, impaired attention, impaired problem solving, L weakness, impaired dynamic balance, gait and therefore will continue to benefit from skilled PT intervention to enhance overall performance with activity tolerance, balance, ability to compensate for deficits, functional use of  left upper extremity and left lower extremity, attention and awareness.  See Patient's Care Plan for progression toward long term goals.  Patient progressing toward long term goals..  Continue plan of care.  Skilled Therapeutic Interventions/Progress Updates:   Patient resting in bed; performed supine > sit EOB with rail and min A with verbal cue to initiate.  Sat EOB with supervision to don bilat shoes with min A; patient required max-total verbal and visual cues to sequence donning correct shoe and to attend to L foot, shoe and L lace when tying shoes.  Performed gait on unit x 150' + 100' x 2 with RW and min-mod A overall with verbal and visual cues to attend to environment on L and R side and for safe management of AD around obstacles and people.  Reviewed one step up for home entry with RW; performed up and down one step with RW x 2 reps with min-mod A and max verbal  and visual cues for safe sequence with RW.  Attempted simulated car transfer x 2 reps; each time patient unable to sequence, problem solve or perform stand pivot car transfer with RW even with total verbal, visual and tactile cues and would become frustrated; patient became very fatigued and began to c/o LBP and required two sitting rest breaks to rest back.  Returned to room and had tech assess CBG; BP and HR assessed, all WFL (112/54, 81 bpm and 118/67, 84 bpm).  PM session:  Patient asleep, once again had to orient patient to time and situation.  Patient performed supine > sit with husband's assistance; transfer bed > w/c with min A, no AD.  Performed w/c on unit with total A for energy conservation.  Reviewed again with patient car transfer with RW and demonstrated sequence to patient; patient continued to require max-total verbal, visual and tactile cues for initiation, sequencing, safety with AD to approach car, open door and sit on seat (patient attempting to sit on floor board).  Performed standing silverware sorting activity in standing at kitchen counter placing knives to far left and spoons near left for standing activity tolerance, attention to task, ability to follow simple commands and L visual scanning.  Patient required total verbal and visual cues to recall sorting instructions, patient perseverating on taking spoons out of drawer and required frequent redirection to attend to task and for full scanning to L.  Performed 5 reps step up/down to 8 inch step with RW to mimic home entry/exit; on 5th attempt patient LE became very weak and required  max A to complete step up; returned to room and to bed with max A secondary to fatigue.  Alerted PA to patient's change in mental status.  Therapy Documentation Precautions:  Precautions Precautions: Fall Restrictions Weight Bearing Restrictions: No Pain: Pain Assessment Pain Assessment: Faces Pain Score: 0-No pain Faces Pain Scale: Hurts little  more Pain Location: Back Pain Intervention(s): RN made aware;Repositioned Locomotion : Ambulation Ambulation/Gait Assistance: 3: Mod assist   See FIM for current functional status  Therapy/Group: Individual Therapy  [name] [name] 09/03/2012, 12:10 PM

## 2012-09-03 NOTE — Progress Notes (Signed)
Patient ID: [name] [name], female   DOB: 1941-03-12, 71 y.o.   MRN: [id number] Subjective/Complaints: [name] [name] is a 71 y.o. female with history of DM, fall 10 days PTA on 08/18/12 with worsening of HA and 3 day history of running into things on the left with gait problems. Was taking ASA q 6 hours for headaches. MRI head done on outpatient basis with right posterior temporo-occipital hemorrhage and patient admitted for workup. Patient developed seizure on 10/17 pm and was loaded with dilantin and started on Keppra. EEG done revealing moderately severe, continuous nonspecific slowing of cerebral activity with question of encephalopathy v/s post ictal state. She did develop fever with hypotension and patient intubated on 10/18. CCM consulted for input and patient started on IV zosyn briefly for presumed aspiration PNA.   Sleeping but arouses to voice Review of Systems  Unable to perform ROS: mental status change    Objective: Vital Signs: Blood pressure 123/55, pulse 89, temperature 98 F (36.7 C), temperature source Oral, resp. rate 18, weight 49.9 kg (110 lb 0.2 oz), SpO2 96.00%. Dg Swallowing Func-speech Pathology        Results for orders placed during the hospital encounter of 08/27/12 (from the past 72 hour(s))  GLUCOSE, CAPILLARY     Status: Abnormal   Collection Time   08/31/12 11:11 AM      Component Value Range Comment   Glucose-Capillary 349 (*) 70 - 99 mg/dL    Comment 1 Notify RN     GLUCOSE, CAPILLARY     Status: Normal   Collection Time   08/31/12  5:03 PM      Component Value Range Comment   Glucose-Capillary 70  70 - 99 mg/dL   GLUCOSE, CAPILLARY     Status: Normal   Collection Time   08/31/12 10:15 PM      Component Value Range Comment   Glucose-Capillary 94  70 - 99 mg/dL   GLUCOSE, CAPILLARY     Status: Abnormal   Collection Time   09/01/12  7:11 AM      Component Value Range Comment   Glucose-Capillary 125 (*) 70 - 99 mg/dL    Comment 1 Notify RN     GLUCOSE,  CAPILLARY     Status: Abnormal   Collection Time   09/01/12 11:23 AM      Component Value Range Comment   Glucose-Capillary 254 (*) 70 - 99 mg/dL    Comment 1 Notify RN     GLUCOSE, CAPILLARY     Status: Abnormal   Collection Time   09/01/12 12:03 PM      Component Value Range Comment   Glucose-Capillary 259 (*) 70 - 99 mg/dL    Comment 1 Notify RN     TROPONIN I     Status: Normal   Collection Time   09/01/12  1:30 PM      Component Value Range Comment   Troponin I <0.30  <0.30 ng/mL   GLUCOSE, CAPILLARY     Status: Normal   Collection Time   09/01/12  4:22 PM      Component Value Range Comment   Glucose-Capillary 90  70 - 99 mg/dL    Comment 1 Notify RN     TROPONIN I     Status: Normal   Collection Time   09/01/12  6:14 PM      Component Value Range Comment   Troponin I <0.30  <0.30 ng/mL   GLUCOSE, CAPILLARY     Status: Abnormal  Collection Time   09/01/12  8:44 PM      Component Value Range Comment   Glucose-Capillary 231 (*) 70 - 99 mg/dL    Comment 1 Notify RN     TROPONIN I     Status: Normal   Collection Time   09/02/12  1:16 AM      Component Value Range Comment   Troponin I <0.30  <0.30 ng/mL   GLUCOSE, CAPILLARY     Status: Abnormal   Collection Time   09/02/12  7:05 AM      Component Value Range Comment   Glucose-Capillary 123 (*) 70 - 99 mg/dL    Comment 1 Notify RN     CBC WITH DIFFERENTIAL     Status: Abnormal   Collection Time   09/02/12 10:11 AM      Component Value Range Comment   WBC 9.2  4.0 - 10.5 K/uL    RBC 3.35 (*) 3.87 - 5.11 MIL/uL    Hemoglobin 10.5 (*) 12.0 - 15.0 g/dL    HCT 62.1 (*) 30.8 - 46.0 %    MCV 98.8  78.0 - 100.0 fL    MCH 31.3  26.0 - 34.0 pg    MCHC 31.7  30.0 - 36.0 g/dL    RDW 65.7  84.6 - 96.2 %    Platelets 549 (*) 150 - 400 K/uL    Neutrophils Relative 73  43 - 77 %    Neutro Abs 6.7  1.7 - 7.7 K/uL    Lymphocytes Relative 17  12 - 46 %    Lymphs Abs 1.6  0.7 - 4.0 K/uL    Monocytes Relative 9  3 - 12 %     Monocytes Absolute 0.8  0.1 - 1.0 K/uL    Eosinophils Relative 1  0 - 5 %    Eosinophils Absolute 0.1  0.0 - 0.7 K/uL    Basophils Relative 0  0 - 1 %    Basophils Absolute 0.0  0.0 - 0.1 K/uL   BASIC METABOLIC PANEL     Status: Abnormal   Collection Time   09/02/12 10:12 AM      Component Value Range Comment   Sodium 134 (*) 135 - 145 mEq/L    Potassium 4.7  3.5 - 5.1 mEq/L    Chloride 91 (*) 96 - 112 mEq/L    CO2 32  19 - 32 mEq/L    Glucose, Bld 400 (*) 70 - 99 mg/dL    BUN 18  6 - 23 mg/dL    Creatinine, Ser 9.52 (*) 0.50 - 1.10 mg/dL    Calcium 8.5  8.4 - 84.1 mg/dL    GFR calc non Af Amer 47 (*) >90 mL/min    GFR calc Af Amer 55 (*) >90 mL/min   GLUCOSE, CAPILLARY     Status: Abnormal   Collection Time   09/02/12 11:32 AM      Component Value Range Comment   Glucose-Capillary 326 (*) 70 - 99 mg/dL    Comment 1 Notify RN     GLUCOSE, CAPILLARY     Status: Abnormal   Collection Time   09/02/12  4:08 PM      Component Value Range Comment   Glucose-Capillary 41 (*) 70 - 99 mg/dL    Comment 1 Notify RN     GLUCOSE, CAPILLARY     Status: Abnormal   Collection Time   09/02/12  4:25 PM      Component  Value Range Comment   Glucose-Capillary 43 (*) 70 - 99 mg/dL    Comment 1 Notify RN     GLUCOSE, CAPILLARY     Status: Abnormal   Collection Time   09/02/12  4:44 PM      Component Value Range Comment   Glucose-Capillary 56 (*) 70 - 99 mg/dL   GLUCOSE, CAPILLARY     Status: Normal   Collection Time   09/02/12  5:00 PM      Component Value Range Comment   Glucose-Capillary 96  70 - 99 mg/dL    Comment 1 Notify RN     URINALYSIS, ROUTINE W REFLEX MICROSCOPIC     Status: Abnormal   Collection Time   09/02/12  5:39 PM      Component Value Range Comment   Color, Urine YELLOW  YELLOW    APPearance CLEAR  CLEAR    Specific Gravity, Urine 1.010  1.005 - 1.030    pH 7.5  5.0 - 8.0    Glucose, UA 100 (*) NEGATIVE mg/dL    Hgb urine dipstick NEGATIVE  NEGATIVE    Bilirubin  Urine NEGATIVE  NEGATIVE    Ketones, ur NEGATIVE  NEGATIVE mg/dL    Protein, ur NEGATIVE  NEGATIVE mg/dL    Urobilinogen, UA 0.2  0.0 - 1.0 mg/dL    Nitrite NEGATIVE  NEGATIVE    Leukocytes, UA NEGATIVE  NEGATIVE MICROSCOPIC NOT DONE ON URINES WITH NEGATIVE PROTEIN, BLOOD, LEUKOCYTES, NITRITE, OR GLUCOSE <1000 mg/dL.  GLUCOSE, CAPILLARY     Status: Abnormal   Collection Time   09/02/12  9:10 PM      Component Value Range Comment   Glucose-Capillary 60 (*) 70 - 99 mg/dL   GLUCOSE, CAPILLARY     Status: Normal   Collection Time   09/02/12  9:32 PM      Component Value Range Comment   Glucose-Capillary 79  70 - 99 mg/dL    Comment 1 Notify RN     GLUCOSE, CAPILLARY     Status: Abnormal   Collection Time   09/03/12  2:00 AM      Component Value Range Comment   Glucose-Capillary 153 (*) 70 - 99 mg/dL    Comment 1 Notify RN        HEENT: normal Cardio: RRR Resp: CTA B/L GI: BS positive Extremity:  Pulses positive Skin:   Intact Neuro: Awake and alert, Cranial Nerve II-XII normal, Abnormal Sensory reduced on L , Normal Motor, Abnormal FMC Ataxic/ dec FMC, Inattention on L mild, apraxia Musc/Skel:  Normal Oriented to person place and situation   Assessment/Plan: 1. Functional deficits secondary to R temporo-occipital ICH which require 3+ hours per day of interdisciplinary therapy in a comprehensive inpatient rehab setting. Physiatrist is providing close team supervision and 24 hour management of active medical problems listed below. Physiatrist and rehab team continue to assess barriers to discharge/monitor patient progress toward functional and medical goals. FIM: FIM - Bathing Bathing Steps Patient Completed: Chest;Right Arm;Left Arm;Abdomen;Front perineal area;Buttocks;Right upper leg;Left upper leg;Right lower leg (including foot);Left lower leg (including foot) Bathing: 4: Steadying assist  FIM - Upper Body Dressing/Undressing Upper body dressing/undressing steps patient  completed: Thread/unthread right sleeve of pullover shirt/dresss;Thread/unthread left sleeve of pullover shirt/dress;Put head through opening of pull over shirt/dress;Pull shirt over trunk Upper body dressing/undressing: 5: Supervision: Safety issues/verbal cues FIM - Lower Body Dressing/Undressing Lower body dressing/undressing steps patient completed: Thread/unthread right underwear leg;Thread/unthread left underwear leg;Pull underwear up/down;Thread/unthread right pants leg;Thread/unthread  left pants leg;Pull pants up/down;Don/Doff right sock;Don/Doff left sock Lower body dressing/undressing: 4: Steadying Assist  FIM - Toileting Toileting steps completed by patient: Adjust clothing prior to toileting;Adjust clothing after toileting;Performs perineal hygiene Toileting Assistive Devices: Grab bar or rail for support Toileting: 4: Steadying assist  FIM - Diplomatic Services operational officer Devices: Bedside commode Toilet Transfers: 4-To toilet/BSC: Min A (steadying Pt. > 75%);4-From toilet/BSC: Min A (steadying Pt. > 75%)  FIM - Banker Devices: Walker;Arm rests Bed/Chair Transfer: 4: Bed > Chair or W/C: Min A (steadying Pt. > 75%);4: Chair or W/C > Bed: Min A (steadying Pt. > 75%)  FIM - Locomotion: Wheelchair Distance: 150 Locomotion: Wheelchair: 3: Travels 150 ft or more: maneuvers on rugs and over door sills with moderate assistance  (Pt: 50 - 74%) FIM - Locomotion: Ambulation Locomotion: Ambulation Assistive Devices: Designer, industrial/product Ambulation/Gait Assistance: 4: Min assist Locomotion: Ambulation: 2: Travels 50 - 149 ft with minimal assistance (Pt.>75%)  Comprehension Comprehension Mode: Auditory Comprehension: 4-Understands basic 75 - 89% of the time/requires cueing 10 - 24% of the time  Expression Expression Mode: Verbal Expression: 4-Expresses basic 75 - 89% of the time/requires cueing 10 - 24% of the time. Needs helper to  occlude trach/needs to repeat words.  Social Interaction Social Interaction: 5-Interacts appropriately 90% of the time - Needs monitoring or encouragement for participation or interaction.  Problem Solving Problem Solving: 3-Solves basic 50 - 74% of the time/requires cueing 25 - 49% of the time  Memory Memory: 3-Recognizes or recalls 50 - 74% of the time/requires cueing 25 - 49% of the time   Medical Problem List and Plan:  1. DVT Prophylaxis/Anticoagulation: Mechanical: Sequential compression devices, below knee Bilateral lower extremities  2. Pain Management: N/A  3. Mood: difficult to judge at this time due to impaired cognition and poor awareness.  4. Neuropsych: This patient is not capable of making decisions on his/her own behalf.  5. DM type 2 insulin dependent : BS have been poorly controlled. Continue levimir with meal coverage and SSI. Monitor CBG AC/HS checks. Hypoglycemia likely due to novalog.  Glucophage XR just stareted 6. Orthostasis: will check ortho static BP in am and set parameters for BP meds.  7. New onset seizures: Continue keppra 250 mg bid. Monitor for recurrence. No signs thus far 8. Resting tachycardia: likely due to deconditioning. Will monitor with bid checks and patient symptoms.  9. Hypokalemia: likely dilutional due to IVF supplement and recheck 10/28.  10. ABLA: continue to monitor for trends. Recheck 10/28.  11. Reactive leucocytosis due to aspiration PNA: Resolving. BC/UC negative. Check follow up labs on 10/28.  12. Dysphagia: Will nocturnal IVF.D2 thin 13.  Pneumonia vs pleural effusion, lungs sound ok but Xray shows LLL fluid.  Afebrile now on Avelox 13.  GERD, on protonix, troponins neg LOS (Days) 7 A FACE TO FACE EVALUATION WAS PERFORMED  Laneta Guerin E 09/03/2012, 7:33 AM

## 2012-09-03 NOTE — Progress Notes (Signed)
Nutrition Follow-up  Intervention:  Discontinue Ensure Pudding. Add Magic Cup to trays. RD to continue to follow.  Assessment:   Pt advanced to Dysphagia 2 diet with thin liquids on 10/20. Nocturnal IVF discontinued on 10/30. Consuming 50 - 100% of meals.  PA Pam Love wrote in diet order for pt to receive pancake, muffin, and french toast for breakfast. Discussed with SLP as Dysphagia 2 diet does not currently allow this. Current diet order will not provide these foods.  Pt states that she would like for Ensure Pudding to be discontinued, she would rather consumed [employer].  Diet Order:  Dysphagia 2 diet with Thin Liquids Supplement: Ensure Pudding PO TID  Meds: Scheduled Meds:   . antiseptic oral rinse  15 mL Mouth Rinse BID  . famotidine  20 mg Oral BID  . feeding supplement  1 Container Oral TID BM  . insulin aspart  0-15 Units Subcutaneous TID WC  . insulin detemir  16 Units Subcutaneous Q breakfast  . levETIRAcetam  250 mg Oral BID  . metFORMIN  500 mg Oral Q breakfast  . metoprolol tartrate  50 mg Oral BID  . moxifloxacin  400 mg Oral Q2000  . multivitamin with minerals  1 tablet Oral Daily  . pantoprazole sodium  40 mg Oral Daily  . vitamin C  500 mg Oral Daily   Continuous Infusions:  PRN Meds:.acetaminophen, alum & mag hydroxide-simeth, bisacodyl, diphenhydrAMINE, guaiFENesin-dextromethorphan, prochlorperazine, prochlorperazine, prochlorperazine, RESOURCE THICKENUP CLEAR, sennosides, traZODone   CMP     Component Value Date/Time   NA 134* 09/02/2012 1012   K 4.7 09/02/2012 1012   CL 91* 09/02/2012 1012   CO2 32 09/02/2012 1012   GLUCOSE 400* 09/02/2012 1012   BUN 18 09/02/2012 1012   CREATININE 1.14* 09/02/2012 1012   CREATININE 1.34* 08/16/2012 1614   CALCIUM 8.5 09/02/2012 1012   PROT 6.4 08/30/2012 0845   ALBUMIN 2.3* 08/30/2012 0845   AST 36 08/30/2012 0845   ALT 33 08/30/2012 0845   ALKPHOS 126* 08/30/2012 0845   BILITOT 0.2* 08/30/2012 0845   GFRNONAA 47* 09/02/2012 1012   GFRAA 55* 09/02/2012 1012    CBG (last 3)   Basename 09/03/12 0716 09/03/12 0200 09/02/12 2132  GLUCAP 146* 153* 79     Intake/Output Summary (Last 24 hours) at 09/03/12 1045 Last data filed at 09/02/12 2120  Gross per 24 hour  Intake    720 ml  Output      0 ml  Net    720 ml  BM 10/30  Weight Status:  110 lb - wt down 4 lb x 3 days  Estimated needs:  1400 - 1600 kcal, 65 - 75 grams protein  Nutrition Dx:  Inadequate oral intake r/t limited appetite and food preferences AEB poor meal completion.  Goal:  Pt to meet >/= 90% of their estimated nutrition needs  Monitor:  weight trends, lab trends, I/O's, PO intake, supplement tolerance  Jarold Motto MS, RD, LDN Pager: [phone] After-hours pager: [phone]

## 2012-09-03 NOTE — Significant Event (Signed)
Hypoglycemic Event  CBG:60 Treatment: 15 GM carbohydrate snack  Symptoms: None  Follow-up CBG: Time:2132 CBG Result:79  Possible Reasons for Event: Unknown  Comments/MD notified: Notified Dan, PA no orders given at this time    Bonnee Quin  Remember to initiate Hypoglycemia Order Set & complete

## 2012-09-03 NOTE — Progress Notes (Signed)
Social Work Patient ID: [name] [name], female   DOB: [birth year], 71 y.o.   MRN: [id number] Met with pt and husband who report she is doing well and both pleased with.  Pt reports she is feeling better. Discussed follow up therapies at discharge.  See what team recommends.

## 2012-09-04 ENCOUNTER — Inpatient Hospital Stay (HOSPITAL_COMMUNITY): Payer: Medicare Other | Admitting: Speech Pathology

## 2012-09-04 ENCOUNTER — Inpatient Hospital Stay (HOSPITAL_COMMUNITY): Payer: Medicare Other

## 2012-09-04 DIAGNOSIS — R569 Unspecified convulsions: Secondary | ICD-10-CM

## 2012-09-04 DIAGNOSIS — I69991 Dysphagia following unspecified cerebrovascular disease: Secondary | ICD-10-CM

## 2012-09-04 DIAGNOSIS — I619 Nontraumatic intracerebral hemorrhage, unspecified: Secondary | ICD-10-CM

## 2012-09-04 LAB — GLUCOSE, CAPILLARY
Glucose-Capillary: 70 mg/dL (ref 70–99)
Glucose-Capillary: 151 mg/dL — ABNORMAL HIGH (ref 70–99)
Glucose-Capillary: 304 mg/dL — ABNORMAL HIGH (ref 70–99)
Glucose-Capillary: 161 mg/dL — ABNORMAL HIGH (ref 70–99)

## 2012-09-04 MED ORDER — METFORMIN HCL ER 500 MG PO TB24
250.0000 mg | ORAL_TABLET | Freq: Every day | ORAL | Status: DC
  Administered 2012-09-04 – 2012-09-05 (×2): 250 mg via ORAL
  Filled 2012-09-04 (×4): qty 0.5

## 2012-09-04 NOTE — Plan of Care (Signed)
Problem: RH SAFETY Goal: RH STG ADHERE TO SAFETY PRECAUTIONS W/ASSISTANCE/DEVICE STG Adhere to Safety Precautions With mod Assistance/Device.  Pt can still be impulsive at times. Using bed alarm

## 2012-09-04 NOTE — Plan of Care (Signed)
Problem: RH PAIN MANAGEMENT Goal: RH STG PAIN MANAGED AT OR BELOW PT'S PAIN GOAL 2/10  Outcome: Progressing Only requiring Tylenol 650mg  intermittently

## 2012-09-04 NOTE — Plan of Care (Signed)
Problem: RH BLADDER ELIMINATION Goal: RH STG MANAGE BLADDER WITH ASSISTANCE STG Manage Bladder With Assistance . Mod I Outcome: Progressing Ambulates to bathroom. Uses bedside commode at hs

## 2012-09-04 NOTE — Progress Notes (Signed)
Speech Language Pathology Daily Session Note  Patient Details  Name: [name] [name] MRN: [id number] Date of Birth: 09-Sep-1941  Today's Date: 09/04/2012 Time: 0900-0930 Time Calculation (min): 30 min  Short Term Goals: Week 1: SLP Short Term Goal 1 (Week 1): Patient will tolerate Dys. 2 textures and thin liquids via teaspoon with mod assist verbal cues for use of compensatory strategies. SLP Short Term Goal 1 - Progress (Week 1): Progressing toward goal SLP Short Term Goal 2 (Week 1): Patient will tolerate an objective swallow study to determine presence of aspiration/penetration with min assist.   SLP Short Term Goal 2 - Progress (Week 1): Progressing toward goal SLP Short Term Goal 3 (Week 1): Patient will sustain attention for 5 minutes during a functional task with mod assist verbal cues for redirection.   SLP Short Term Goal 3 - Progress (Week 1): Progressing toward goal SLP Short Term Goal 4 (Week 1): Patient will utilize compensatory strategies to facilitate increased recall of new information with mod-max assist verbal cues.   SLP Short Term Goal 4 - Progress (Week 1): Progressing toward goal SLP Short Term Goal 5 (Week 1): Patient will solve basic problems during a familiar task with mod assist verbal cues.  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 SLP Short Term Goal 6 - Progress (Week 1): Progressing toward goal  Skilled Therapeutic Interventions: Treatment focus on dysphagia goals.  Pt did not like any of the items that were on her breakfast tray of Dys. 2 textures and per PA order and pt request (09/03/12), pt was brought up a pancake (Dys. 3 texture).  The pancake was chopped and softened with syrup. The pt demonstrated efficient mastication but required Max-total A for utilization of swallowing compensatory  strategies. Pt without overt s/s of aspiration with thin liquids. RN and nurse tech educated on pt's current diet recommendations and special requests were removed from the computer.     FIM:  Comprehension Comprehension Mode: Auditory Comprehension: 3-Understands basic 50 - 74% of the time/requires cueing 25 - 50%  of the time Expression Expression Mode: Verbal Expression: 4-Expresses basic 75 - 89% of the time/requires cueing 10 - 24% of the time. Needs helper to occlude trach/needs to repeat words. Problem Solving Problem Solving: 2-Solves basic 25 - 49% of the time - needs direction more than half the time to initiate, plan or complete simple activities Memory Memory: 2-Recognizes or recalls 25 - 49% of the time/requires cueing 51 - 75% of the time FIM - Eating Eating Activity: 5: Set-up assist for open containers;5: Supervision/cues  Pain Pain Assessment Pain Assessment: No/denies pain Pain Score: 0-No pain  Therapy/Group: Individual Therapy  Corey Caulfield 09/04/2012, 2:16 PM

## 2012-09-04 NOTE — Progress Notes (Signed)
Hypoglycemic Event  CBG:54  Treatment: 15 GM carbohydrate snack  Symptoms: Shaky and Nervous/irritable  Follow-up CBG: Time:2000 CBG Result:70  Possible Reasons for Event: Inadequate meal intake patient meal was late  Comments/MD notified:N/A    [place], Domenica Reamer  Remember to initiate Hypoglycemia Order Set & complete

## 2012-09-04 NOTE — Progress Notes (Signed)
Physical Therapy Note  Patient Details  Name: [name] [name] MRN: [id number] Date of Birth: April 14, 1941 Today's Date: 09/04/2012  1100-1155 (55 minutes ) group Pain: no complaint of pain Pt participated in PT group session focused on gait training/safety/endurance. Pt required max vcs for hand placement sit >< stand from wc and ambulates 80 feet X 2 with RW min assist  and mod vcs for direction and to avoid objects  secondary to decreased  Vision.   Stiles Maxcy,JIM 09/04/2012, 8:01 AM

## 2012-09-04 NOTE — Progress Notes (Signed)
Patient ID: Palak Tercero, female   DOB: 03-13-1941, 71 y.o.   MRN: [id number] Subjective/Complaints: Kluge is a 71 y.o. female with history of DM, fall 10 days PTA on 08/18/12 with worsening of HA and 3 day history of running into things on the left with gait problems. Was taking ASA q 6 hours for headaches. MRI head done on outpatient basis with right posterior temporo-occipital hemorrhage and patient admitted for workup. Patient developed seizure on 10/17 pm and was loaded with dilantin and started on Keppra. EEG done revealing moderately severe, continuous nonspecific slowing of cerebral activity with question of encephalopathy v/s post ictal state. She did develop fever with hypotension and patient intubated on 10/18. CCM consulted for input and patient started on IV zosyn briefly for presumed aspiration PNA.     Slept well. Cold however due to room being cool.  Review of Systems  Unable to perform ROS: mental status change    Objective: Vital Signs: Blood pressure 117/61, pulse 82, temperature 97.6 F (36.4 C), temperature source Oral, resp. rate 16, weight 48.9 kg (107 lb 12.9 oz), SpO2 96.00%. Dg Swallowing Func-speech Pathology        Results for orders placed during the hospital encounter of 08/27/12 (from the past 72 hour(s))  GLUCOSE, CAPILLARY     Status: Abnormal   Collection Time   09/01/12 11:23 AM      Component Value Range Comment   Glucose-Capillary 254 (*) 70 - 99 mg/dL    Comment 1 Notify RN     GLUCOSE, CAPILLARY     Status: Abnormal   Collection Time   09/01/12 12:03 PM      Component Value Range Comment   Glucose-Capillary 259 (*) 70 - 99 mg/dL    Comment 1 Notify RN     TROPONIN I     Status: Normal   Collection Time   09/01/12  1:30 PM      Component Value Range Comment   Troponin I <0.30  <0.30 ng/mL   GLUCOSE, CAPILLARY     Status: Normal   Collection Time   09/01/12  4:22 PM      Component Value Range Comment   Glucose-Capillary 90  70 - 99 mg/dL    Comment 1 Notify RN     TROPONIN I     Status: Normal   Collection Time   09/01/12  6:14 PM      Component Value Range Comment   Troponin I <0.30  <0.30 ng/mL   GLUCOSE, CAPILLARY     Status: Abnormal   Collection Time   09/01/12  8:44 PM      Component Value Range Comment   Glucose-Capillary 231 (*) 70 - 99 mg/dL    Comment 1 Notify RN     TROPONIN I     Status: Normal   Collection Time   09/02/12  1:16 AM      Component Value Range Comment   Troponin I <0.30  <0.30 ng/mL   GLUCOSE, CAPILLARY     Status: Abnormal   Collection Time   09/02/12  7:05 AM      Component Value Range Comment   Glucose-Capillary 123 (*) 70 - 99 mg/dL    Comment 1 Notify RN     CBC WITH DIFFERENTIAL     Status: Abnormal   Collection Time   09/02/12 10:11 AM      Component Value Range Comment   WBC 9.2  4.0 - 10.5 K/uL    RBC  3.35 (*) 3.87 - 5.11 MIL/uL    Hemoglobin 10.5 (*) 12.0 - 15.0 g/dL    HCT 62.1 (*) 30.8 - 46.0 %    MCV 98.8  78.0 - 100.0 fL    MCH 31.3  26.0 - 34.0 pg    MCHC 31.7  30.0 - 36.0 g/dL    RDW 65.7  84.6 - 96.2 %    Platelets 549 (*) 150 - 400 K/uL    Neutrophils Relative 73  43 - 77 %    Neutro Abs 6.7  1.7 - 7.7 K/uL    Lymphocytes Relative 17  12 - 46 %    Lymphs Abs 1.6  0.7 - 4.0 K/uL    Monocytes Relative 9  3 - 12 %    Monocytes Absolute 0.8  0.1 - 1.0 K/uL    Eosinophils Relative 1  0 - 5 %    Eosinophils Absolute 0.1  0.0 - 0.7 K/uL    Basophils Relative 0  0 - 1 %    Basophils Absolute 0.0  0.0 - 0.1 K/uL   BASIC METABOLIC PANEL     Status: Abnormal   Collection Time   09/02/12 10:12 AM      Component Value Range Comment   Sodium 134 (*) 135 - 145 mEq/L    Potassium 4.7  3.5 - 5.1 mEq/L    Chloride 91 (*) 96 - 112 mEq/L    CO2 32  19 - 32 mEq/L    Glucose, Bld 400 (*) 70 - 99 mg/dL    BUN 18  6 - 23 mg/dL    Creatinine, Ser 9.52 (*) 0.50 - 1.10 mg/dL    Calcium 8.5  8.4 - 84.1 mg/dL    GFR calc non Af Amer 47 (*) >90 mL/min    GFR calc Af Amer 55 (*)  >90 mL/min   GLUCOSE, CAPILLARY     Status: Abnormal   Collection Time   09/02/12 11:32 AM      Component Value Range Comment   Glucose-Capillary 326 (*) 70 - 99 mg/dL    Comment 1 Notify RN     GLUCOSE, CAPILLARY     Status: Abnormal   Collection Time   09/02/12  4:08 PM      Component Value Range Comment   Glucose-Capillary 41 (*) 70 - 99 mg/dL    Comment 1 Notify RN     GLUCOSE, CAPILLARY     Status: Abnormal   Collection Time   09/02/12  4:25 PM      Component Value Range Comment   Glucose-Capillary 43 (*) 70 - 99 mg/dL    Comment 1 Notify RN     GLUCOSE, CAPILLARY     Status: Abnormal   Collection Time   09/02/12  4:44 PM      Component Value Range Comment   Glucose-Capillary 56 (*) 70 - 99 mg/dL   GLUCOSE, CAPILLARY     Status: Normal   Collection Time   09/02/12  5:00 PM      Component Value Range Comment   Glucose-Capillary 96  70 - 99 mg/dL    Comment 1 Notify RN     URINE CULTURE     Status: Normal (Preliminary result)   Collection Time   09/02/12  5:39 PM      Component Value Range Comment   Specimen Description URINE, CATHETERIZED      Special Requests NONE      Culture  Setup Time 09/02/2012  20:10      Colony Count PENDING      Culture Culture reincubated for better growth      Report Status PENDING     URINALYSIS, ROUTINE W REFLEX MICROSCOPIC     Status: Abnormal   Collection Time   09/02/12  5:39 PM      Component Value Range Comment   Color, Urine YELLOW  YELLOW    APPearance CLEAR  CLEAR    Specific Gravity, Urine 1.010  1.005 - 1.030    pH 7.5  5.0 - 8.0    Glucose, UA 100 (*) NEGATIVE mg/dL    Hgb urine dipstick NEGATIVE  NEGATIVE    Bilirubin Urine NEGATIVE  NEGATIVE    Ketones, ur NEGATIVE  NEGATIVE mg/dL    Protein, ur NEGATIVE  NEGATIVE mg/dL    Urobilinogen, UA 0.2  0.0 - 1.0 mg/dL    Nitrite NEGATIVE  NEGATIVE    Leukocytes, UA NEGATIVE  NEGATIVE MICROSCOPIC NOT DONE ON URINES WITH NEGATIVE PROTEIN, BLOOD, LEUKOCYTES, NITRITE, OR GLUCOSE  <1000 mg/dL.  GLUCOSE, CAPILLARY     Status: Abnormal   Collection Time   09/02/12  9:10 PM      Component Value Range Comment   Glucose-Capillary 60 (*) 70 - 99 mg/dL   GLUCOSE, CAPILLARY     Status: Normal   Collection Time   09/02/12  9:32 PM      Component Value Range Comment   Glucose-Capillary 79  70 - 99 mg/dL    Comment 1 Notify RN     GLUCOSE, CAPILLARY     Status: Abnormal   Collection Time   09/03/12  2:00 AM      Component Value Range Comment   Glucose-Capillary 153 (*) 70 - 99 mg/dL    Comment 1 Notify RN     GLUCOSE, CAPILLARY     Status: Abnormal   Collection Time   09/03/12  7:16 AM      Component Value Range Comment   Glucose-Capillary 146 (*) 70 - 99 mg/dL    Comment 1 Notify RN     GLUCOSE, CAPILLARY     Status: Abnormal   Collection Time   09/03/12 11:56 AM      Component Value Range Comment   Glucose-Capillary 236 (*) 70 - 99 mg/dL   GLUCOSE, CAPILLARY     Status: Normal   Collection Time   09/03/12  4:26 PM      Component Value Range Comment   Glucose-Capillary 89  70 - 99 mg/dL    Comment 1 Notify RN     GLUCOSE, CAPILLARY     Status: Abnormal   Collection Time   09/03/12  7:20 PM      Component Value Range Comment   Glucose-Capillary 54 (*) 70 - 99 mg/dL    Comment 1 Notify RN     GLUCOSE, CAPILLARY     Status: Normal   Collection Time   09/03/12  8:00 PM      Component Value Range Comment   Glucose-Capillary 70  70 - 99 mg/dL    Comment 1 Notify RN     GLUCOSE, CAPILLARY     Status: Abnormal   Collection Time   09/04/12  1:25 AM      Component Value Range Comment   Glucose-Capillary 151 (*) 70 - 99 mg/dL    Comment 1 Notify RN        HEENT: normal Cardio: RRR Resp: CTA B/L GI: BS positive  Extremity:  Pulses positive Skin:   Intact Neuro: Awake and alert, Cranial Nerve II-XII normal, Abnormal Sensory reduced on L , Normal Motor, Abnormal FMC Ataxic/ dec FMC, Inattention on L mild, apraxia Musc/Skel:  Normal Oriented to person place and  situation. Converses nicely. Very alert. laughing   Assessment/Plan: 1. Functional deficits secondary to R temporo-occipital ICH which require 3+ hours per day of interdisciplinary therapy in a comprehensive inpatient rehab setting. Physiatrist is providing close team supervision and 24 hour management of active medical problems listed below. Physiatrist and rehab team continue to assess barriers to discharge/monitor patient progress toward functional and medical goals. FIM: FIM - Bathing Bathing Steps Patient Completed: Chest;Right Arm;Left Arm;Abdomen;Front perineal area;Buttocks;Right upper leg;Left upper leg;Right lower leg (including foot);Left lower leg (including foot) Bathing: 4: Steadying assist  FIM - Upper Body Dressing/Undressing Upper body dressing/undressing steps patient completed: Thread/unthread right sleeve of pullover shirt/dresss;Thread/unthread left sleeve of pullover shirt/dress;Put head through opening of pull over shirt/dress;Pull shirt over trunk Upper body dressing/undressing: 5: Set-up assist to: Obtain clothing/put away FIM - Lower Body Dressing/Undressing Lower body dressing/undressing steps patient completed: Thread/unthread right underwear leg;Thread/unthread left underwear leg;Pull underwear up/down;Thread/unthread right pants leg;Thread/unthread left pants leg;Pull pants up/down;Don/Doff right sock;Don/Doff left sock Lower body dressing/undressing: 4: Steadying Assist  FIM - Toileting Toileting steps completed by patient: Adjust clothing prior to toileting;Performs perineal hygiene;Adjust clothing after toileting Toileting Assistive Devices: Grab bar or rail for support Toileting: 4: Steadying assist  FIM - Diplomatic Services operational officer Devices: Grab bars;Walker Toilet Transfers: 4-To toilet/BSC: Min A (steadying Pt. > 75%);4-From toilet/BSC: Min A (steadying Pt. > 75%)  FIM - Banker Devices:  Walker;Arm rests;Bed rails Bed/Chair Transfer: 4: Supine > Sit: Min A (steadying Pt. > 75%/lift 1 leg);4: Bed > Chair or W/C: Min A (steadying Pt. > 75%);4: Chair or W/C > Bed: Min A (steadying Pt. > 75%)  FIM - Locomotion: Wheelchair Distance: 150 Locomotion: Wheelchair: 0: Activity did not occur FIM - Locomotion: Ambulation Locomotion: Ambulation Assistive Devices: Designer, industrial/product Ambulation/Gait Assistance: 3: Mod assist Locomotion: Ambulation: 3: Travels 150 ft or more with moderate assistance (Pt: 50 - 74%)  Comprehension Comprehension Mode: Auditory Comprehension: 4-Understands basic 75 - 89% of the time/requires cueing 10 - 24% of the time  Expression Expression Mode: Verbal Expression: 4-Expresses basic 75 - 89% of the time/requires cueing 10 - 24% of the time. Needs helper to occlude trach/needs to repeat words.  Social Interaction Social Interaction: 4-Interacts appropriately 75 - 89% of the time - Needs redirection for appropriate language or to initiate interaction.  Problem Solving Problem Solving: 3-Solves basic 50 - 74% of the time/requires cueing 25 - 49% of the time  Memory Memory: 3-Recognizes or recalls 50 - 74% of the time/requires cueing 25 - 49% of the time   Medical Problem List and Plan:  1. DVT Prophylaxis/Anticoagulation: Mechanical: Sequential compression devices, below knee Bilateral lower extremities  2. Pain Management: N/A  3. Mood: difficult to judge at this time due to impaired cognition and poor awareness.  4. Neuropsych: This patient is not capable of making decisions on his/her own behalf.  5. DM type 2 insulin dependent : BS have been poorly controlled. Continue levimir with meal coverage and SSI. Monitor CBG AC/HS checks. Hypoglycemia likely due to novalog.  Glucophage XR just stareted 6. Orthostasis: will check ortho static BP in am and set parameters for BP meds.  7. New onset seizures: Continue keppra 250 mg bid. Monitor  for recurrence. No  signs thus far 8. Resting tachycardia: likely due to deconditioning. Will monitor with bid checks and patient symptoms.  9. Hypokalemia: likely dilutional due to IVF supplement and recheck 10/28.  10. ABLA: continue to monitor for trends.  .  11. Reactive leucocytosis due to aspiration PNA: Resolving. BC/UC negative.   12. Dysphagia: Will nocturnal IVF.D2 thin 13.  Pneumonia vs pleural effusion, lungs sound ok but Xray shows LLL fluid.  Afebrile now on Avelox 13.  GERD, on protonix, troponins neg 14. Lethargy- HCT negative. i suspect some of sx were from hypoglemia.  -encourage adequate po intake LOS (Days) 8 A FACE TO FACE EVALUATION WAS PERFORMED  Meylin Stenzel T 09/04/2012, 7:39 AM

## 2012-09-05 ENCOUNTER — Inpatient Hospital Stay (HOSPITAL_COMMUNITY): Payer: Medicare Other | Admitting: *Deleted

## 2012-09-05 ENCOUNTER — Inpatient Hospital Stay (HOSPITAL_COMMUNITY): Payer: Medicare Other

## 2012-09-05 LAB — GLUCOSE, CAPILLARY
Glucose-Capillary: 166 mg/dL — ABNORMAL HIGH (ref 70–99)
Glucose-Capillary: 123 mg/dL — ABNORMAL HIGH (ref 70–99)

## 2012-09-05 LAB — CBC
MCH: 30.5 pg (ref 26.0–34.0)
MCV: 96 fL (ref 78.0–100.0)
Platelets: 602 10*3/uL — ABNORMAL HIGH (ref 150–400)
RDW: 12.7 % (ref 11.5–15.5)
WBC: 12.5 10*3/uL — ABNORMAL HIGH (ref 4.0–10.5)

## 2012-09-05 LAB — BASIC METABOLIC PANEL
Calcium: 8.9 mg/dL (ref 8.4–10.5)
Creatinine, Ser: 1.28 mg/dL — ABNORMAL HIGH (ref 0.50–1.10)
GFR calc Af Amer: 48 mL/min — ABNORMAL LOW (ref >90)

## 2012-09-05 NOTE — Progress Notes (Signed)
Patient ID: [name] [name], female   DOB: Oct 14, 1941, 71 y.o.   MRN: [id number] Subjective/Complaints: [name] [name] is a 71 y.o. female with history of DM, fall 10 days PTA on 08/18/12 with worsening of HA and 3 day history of running into things on the left with gait problems. Was taking ASA q 6 hours for headaches. MRI head done on outpatient basis with right posterior temporo-occipital hemorrhage and patient admitted for workup. Patient developed seizure on 10/17 pm and was loaded with dilantin and started on Keppra. EEG done revealing moderately severe, continuous nonspecific slowing of cerebral activity with question of encephalopathy v/s post ictal state. She did develop fever with hypotension and patient intubated on 10/18. CCM consulted for input and patient started on IV zosyn briefly for presumed aspiration PNA.     Didn't sleep as well last night--not sure why. Slower to awaken this am.   Review of Systems  Unable to perform ROS: mental status change    Objective: Vital Signs: Blood pressure 115/69, pulse 80, temperature 98.7 F (37.1 C), temperature source Oral, resp. rate 18, weight 50.984 kg (112 lb 6.4 oz), SpO2 94.00%. Dg Swallowing Func-speech Pathology        Results for orders placed during the hospital encounter of 08/27/12 (from the past 72 hour(s))  CBC WITH DIFFERENTIAL     Status: Abnormal   Collection Time   09/02/12 10:11 AM      Component Value Range Comment   WBC 9.2  4.0 - 10.5 K/uL    RBC 3.35 (*) 3.87 - 5.11 MIL/uL    Hemoglobin 10.5 (*) 12.0 - 15.0 g/dL    HCT 40.9 (*) 81.1 - 46.0 %    MCV 98.8  78.0 - 100.0 fL    MCH 31.3  26.0 - 34.0 pg    MCHC 31.7  30.0 - 36.0 g/dL    RDW 91.4  78.2 - 95.6 %    Platelets 549 (*) 150 - 400 K/uL    Neutrophils Relative 73  43 - 77 %    Neutro Abs 6.7  1.7 - 7.7 K/uL    Lymphocytes Relative 17  12 - 46 %    Lymphs Abs 1.6  0.7 - 4.0 K/uL    Monocytes Relative 9  3 - 12 %    Monocytes Absolute 0.8  0.1 - 1.0 K/uL    Eosinophils Relative 1  0 - 5 %    Eosinophils Absolute 0.1  0.0 - 0.7 K/uL    Basophils Relative 0  0 - 1 %    Basophils Absolute 0.0  0.0 - 0.1 K/uL   BASIC METABOLIC PANEL     Status: Abnormal   Collection Time   09/02/12 10:12 AM      Component Value Range Comment   Sodium 134 (*) 135 - 145 mEq/L    Potassium 4.7  3.5 - 5.1 mEq/L    Chloride 91 (*) 96 - 112 mEq/L    CO2 32  19 - 32 mEq/L    Glucose, Bld 400 (*) 70 - 99 mg/dL    BUN 18  6 - 23 mg/dL    Creatinine, Ser 2.13 (*) 0.50 - 1.10 mg/dL    Calcium 8.5  8.4 - 08.6 mg/dL    GFR calc non Af Amer 47 (*) >90 mL/min    GFR calc Af Amer 55 (*) >90 mL/min   GLUCOSE, CAPILLARY     Status: Abnormal   Collection Time   09/02/12 11:32 AM  Component Value Range Comment   Glucose-Capillary 326 (*) 70 - 99 mg/dL    Comment 1 Notify RN     GLUCOSE, CAPILLARY     Status: Abnormal   Collection Time   09/02/12  4:08 PM      Component Value Range Comment   Glucose-Capillary 41 (*) 70 - 99 mg/dL    Comment 1 Notify RN     GLUCOSE, CAPILLARY     Status: Abnormal   Collection Time   09/02/12  4:25 PM      Component Value Range Comment   Glucose-Capillary 43 (*) 70 - 99 mg/dL    Comment 1 Notify RN     GLUCOSE, CAPILLARY     Status: Abnormal   Collection Time   09/02/12  4:44 PM      Component Value Range Comment   Glucose-Capillary 56 (*) 70 - 99 mg/dL   GLUCOSE, CAPILLARY     Status: Normal   Collection Time   09/02/12  5:00 PM      Component Value Range Comment   Glucose-Capillary 96  70 - 99 mg/dL    Comment 1 Notify RN     URINE CULTURE     Status: Normal (Preliminary result)   Collection Time   09/02/12  5:39 PM      Component Value Range Comment   Specimen Description URINE, CATHETERIZED      Special Requests NONE      Culture  Setup Time 09/02/2012 20:10      Colony Count PENDING      Culture Culture reincubated for better growth      Report Status PENDING     URINALYSIS, ROUTINE W REFLEX MICROSCOPIC      Status: Abnormal   Collection Time   09/02/12  5:39 PM      Component Value Range Comment   Color, Urine YELLOW  YELLOW    APPearance CLEAR  CLEAR    Specific Gravity, Urine 1.010  1.005 - 1.030    pH 7.5  5.0 - 8.0    Glucose, UA 100 (*) NEGATIVE mg/dL    Hgb urine dipstick NEGATIVE  NEGATIVE    Bilirubin Urine NEGATIVE  NEGATIVE    Ketones, ur NEGATIVE  NEGATIVE mg/dL    Protein, ur NEGATIVE  NEGATIVE mg/dL    Urobilinogen, UA 0.2  0.0 - 1.0 mg/dL    Nitrite NEGATIVE  NEGATIVE    Leukocytes, UA NEGATIVE  NEGATIVE MICROSCOPIC NOT DONE ON URINES WITH NEGATIVE PROTEIN, BLOOD, LEUKOCYTES, NITRITE, OR GLUCOSE <1000 mg/dL.  GLUCOSE, CAPILLARY     Status: Abnormal   Collection Time   09/02/12  9:10 PM      Component Value Range Comment   Glucose-Capillary 60 (*) 70 - 99 mg/dL   GLUCOSE, CAPILLARY     Status: Normal   Collection Time   09/02/12  9:32 PM      Component Value Range Comment   Glucose-Capillary 79  70 - 99 mg/dL    Comment 1 Notify RN     GLUCOSE, CAPILLARY     Status: Abnormal   Collection Time   09/03/12  2:00 AM      Component Value Range Comment   Glucose-Capillary 153 (*) 70 - 99 mg/dL    Comment 1 Notify RN     GLUCOSE, CAPILLARY     Status: Abnormal   Collection Time   09/03/12  7:16 AM      Component Value Range Comment  Glucose-Capillary 146 (*) 70 - 99 mg/dL    Comment 1 Notify RN     GLUCOSE, CAPILLARY     Status: Abnormal   Collection Time   09/03/12 11:56 AM      Component Value Range Comment   Glucose-Capillary 236 (*) 70 - 99 mg/dL   GLUCOSE, CAPILLARY     Status: Normal   Collection Time   09/03/12  4:26 PM      Component Value Range Comment   Glucose-Capillary 89  70 - 99 mg/dL    Comment 1 Notify RN     GLUCOSE, CAPILLARY     Status: Abnormal   Collection Time   09/03/12  7:20 PM      Component Value Range Comment   Glucose-Capillary 54 (*) 70 - 99 mg/dL    Comment 1 Notify RN     GLUCOSE, CAPILLARY     Status: Normal   Collection Time    09/03/12  8:00 PM      Component Value Range Comment   Glucose-Capillary 70  70 - 99 mg/dL    Comment 1 Notify RN     GLUCOSE, CAPILLARY     Status: Abnormal   Collection Time   09/04/12  1:25 AM      Component Value Range Comment   Glucose-Capillary 151 (*) 70 - 99 mg/dL    Comment 1 Notify RN     GLUCOSE, CAPILLARY     Status: Abnormal   Collection Time   09/04/12  7:36 AM      Component Value Range Comment   Glucose-Capillary 101 (*) 70 - 99 mg/dL    Comment 1 Notify RN     GLUCOSE, CAPILLARY     Status: Abnormal   Collection Time   09/04/12 10:53 AM      Component Value Range Comment   Glucose-Capillary 304 (*) 70 - 99 mg/dL   GLUCOSE, CAPILLARY     Status: Normal   Collection Time   09/04/12  4:26 PM      Component Value Range Comment   Glucose-Capillary 97  70 - 99 mg/dL    Comment 1 Notify RN     GLUCOSE, CAPILLARY     Status: Abnormal   Collection Time   09/04/12  8:25 PM      Component Value Range Comment   Glucose-Capillary 161 (*) 70 - 99 mg/dL    Comment 1 Notify RN        HEENT: normal Cardio: RRR Resp: CTA B/L GI: BS positive Extremity:  Pulses positive Skin:   Intact Neuro: Awake but less alert, Cranial Nerve II-XII normal, Abnormal Sensory reduced on L , Normal Motor, Abnormal FMC Ataxic/ dec FMC, Inattention on L mild, apraxia Musc/Skel:  Normal Oriented to person place and situation. Converses nicely. Very alert. laughing   Assessment/Plan: 1. Functional deficits secondary to R temporo-occipital ICH which require 3+ hours per day of interdisciplinary therapy in a comprehensive inpatient rehab setting. Physiatrist is providing close team supervision and 24 hour management of active medical problems listed below. Physiatrist and rehab team continue to assess barriers to discharge/monitor patient progress toward functional and medical goals. FIM: FIM - Bathing Bathing Steps Patient Completed: Chest;Right Arm;Left Arm;Abdomen;Front perineal  area;Buttocks;Right upper leg;Left upper leg;Right lower leg (including foot);Left lower leg (including foot) Bathing: 4: Steadying assist  FIM - Upper Body Dressing/Undressing Upper body dressing/undressing steps patient completed: Thread/unthread right sleeve of pullover shirt/dresss;Thread/unthread left sleeve of pullover shirt/dress;Put head through opening of  pull over shirt/dress;Pull shirt over trunk Upper body dressing/undressing: 5: Set-up assist to: Obtain clothing/put away FIM - Lower Body Dressing/Undressing Lower body dressing/undressing steps patient completed: Thread/unthread right underwear leg;Thread/unthread left underwear leg;Pull underwear up/down;Thread/unthread right pants leg;Thread/unthread left pants leg;Pull pants up/down;Don/Doff right sock;Don/Doff left sock Lower body dressing/undressing: 4: Steadying Assist  FIM - Toileting Toileting steps completed by patient: Adjust clothing prior to toileting;Performs perineal hygiene;Adjust clothing after toileting Toileting Assistive Devices: Grab bar or rail for support Toileting: 4: Steadying assist  FIM - Diplomatic Services operational officer Devices: Art gallery manager Transfers: 4-To toilet/BSC: Min A (steadying Pt. > 75%)  FIM - Banker Devices: Walker;Arm rests;Bed rails Bed/Chair Transfer: 4: Supine > Sit: Min A (steadying Pt. > 75%/lift 1 leg);4: Bed > Chair or W/C: Min A (steadying Pt. > 75%);4: Chair or W/C > Bed: Min A (steadying Pt. > 75%)  FIM - Locomotion: Wheelchair Distance: 150 Locomotion: Wheelchair: 0: Activity did not occur FIM - Locomotion: Ambulation Locomotion: Ambulation Assistive Devices: Designer, industrial/product Ambulation/Gait Assistance: 3: Mod assist Locomotion: Ambulation: 3: Travels 150 ft or more with moderate assistance (Pt: 50 - 74%)  Comprehension Comprehension Mode: Auditory Comprehension: 4-Understands basic 75 - 89% of the time/requires cueing  10 - 24% of the time  Expression Expression Mode: Verbal Expression: 4-Expresses basic 75 - 89% of the time/requires cueing 10 - 24% of the time. Needs helper to occlude trach/needs to repeat words.  Social Interaction Social Interaction: 5-Interacts appropriately 90% of the time - Needs monitoring or encouragement for participation or interaction.  Problem Solving Problem Solving: 2-Solves basic 25 - 49% of the time - needs direction more than half the time to initiate, plan or complete simple activities  Memory Memory: 3-Recognizes or recalls 50 - 74% of the time/requires cueing 25 - 49% of the time   Medical Problem List and Plan:  1. DVT Prophylaxis/Anticoagulation: Mechanical: Sequential compression devices, below knee Bilateral lower extremities  2. Pain Management: N/A  3. Mood: difficult to judge at this time due to impaired cognition and poor awareness.  4. Neuropsych: This patient is not capable of making decisions on his/her own behalf.  5. DM type 2 insulin dependent : BS have been poorly controlled. Continue levimir with meal coverage and SSI. Monitor CBG AC/HS checks. Hypoglycemia likely due to novalog.  Glucophage XR just stareted 6. Orthostasis: will check ortho static BP in am and set parameters for BP meds.  7. New onset seizures: Continue keppra 250 mg bid. Monitor for recurrence. No signs thus far 8. Resting tachycardia: likely due to deconditioning. Will monitor with bid checks and patient symptoms.  9. Hypokalemia: likely dilutional due to IVF supplement and recheck 10/28.  10. ABLA: continue to monitor for trends.  .  11. Reactive leucocytosis due to aspiration PNA: Resolving. BC/UC negative.   12. Dysphagia: Will nocturnal IVF.D2 thin 13.  Pneumonia vs pleural effusion, lungs sound ok but Xray shows LLL fluid.  Afebrile now on Avelox 13.  GERD, on protonix, troponins neg 14. Lethargy- HCT negative. i suspect some of sx were from hypoglemia.  -encourage adequate  po intake  -labs ordered for today  -if persistent could consider another anticonvulsant  -spoke with husband at length yesterday regarding clinical picture. i also explained to him that her presentation may wax and wane related to sleep, fatigue from activity, etc LOS (Days) 9 A FACE TO FACE EVALUATION WAS PERFORMED  SWARTZ,ZACHARY T 09/05/2012, 7:25 AM

## 2012-09-05 NOTE — Progress Notes (Signed)
At 1945 Patient complained of "chest pain". Patient reports tender to touch, then requested maalox for indigestion. PRN tylenol and maalox given at 2000. Paged Dr. Riley Kill with above info. Along with vitals. Patient without further complaint of. Patient with multi attempts to get OOB, bedalarm in use to alert staff and remind patient not to get up without assistance. Patient alert and oriented to self during the night. Trazodone 50mg  given at 2358, not effective. At 0245 incont of urine, requiring staff assist with hygiene and linen change. Meds given whole with puree. SCD's in place. [name] [name] A

## 2012-09-05 NOTE — Progress Notes (Signed)
Physical Therapy Note  Patient Details  Name: [name] [name] MRN: [id number] Date of Birth: December 24, 1940 Today's Date: 09/05/2012  1400-1455 (55 minutes) group Pain: pt reports unrated abdominal pain/premedicated Pt participated in PT group session focused on gait training /safety/endurance. Pt ambulated in gym with min/mod assist for guiding AD/ balance and mod vcs for safe use of RW (80 feet X 2). Pt also performed ball toss in standing to challenge balance (min assist).    Wolf Boulay,JIM 09/05/2012, 8:00 AM

## 2012-09-06 ENCOUNTER — Inpatient Hospital Stay (HOSPITAL_COMMUNITY): Payer: Medicare Other | Admitting: Physical Therapy

## 2012-09-06 ENCOUNTER — Inpatient Hospital Stay (HOSPITAL_COMMUNITY): Payer: Medicare Other | Admitting: Speech Pathology

## 2012-09-06 ENCOUNTER — Inpatient Hospital Stay (HOSPITAL_COMMUNITY): Payer: Medicare Other | Admitting: Occupational Therapy

## 2012-09-06 DIAGNOSIS — I69991 Dysphagia following unspecified cerebrovascular disease: Secondary | ICD-10-CM

## 2012-09-06 DIAGNOSIS — R569 Unspecified convulsions: Secondary | ICD-10-CM

## 2012-09-06 DIAGNOSIS — I619 Nontraumatic intracerebral hemorrhage, unspecified: Secondary | ICD-10-CM

## 2012-09-06 LAB — GLUCOSE, CAPILLARY
Glucose-Capillary: 131 mg/dL — ABNORMAL HIGH (ref 70–99)
Glucose-Capillary: 191 mg/dL — ABNORMAL HIGH (ref 70–99)
Glucose-Capillary: 79 mg/dL (ref 70–99)
Glucose-Capillary: 97 mg/dL (ref 70–99)

## 2012-09-06 MED ORDER — SENNOSIDES-DOCUSATE SODIUM 8.6-50 MG PO TABS
2.0000 | ORAL_TABLET | Freq: Every day | ORAL | Status: DC
  Administered 2012-09-06 – 2012-09-07 (×2): 2 via ORAL
  Filled 2012-09-06 (×2): qty 2

## 2012-09-06 MED ORDER — CEPHALEXIN 250 MG PO CAPS
250.0000 mg | ORAL_CAPSULE | Freq: Three times a day (TID) | ORAL | Status: DC
  Administered 2012-09-06 – 2012-09-07 (×4): 250 mg via ORAL
  Filled 2012-09-06 (×7): qty 1

## 2012-09-06 NOTE — Progress Notes (Signed)
The above skilled treatment note & weekly progress note has been reviewed and SLP is in agreement. Fae Pippin, M.A., CCC-SLP [phone]

## 2012-09-06 NOTE — Progress Notes (Signed)
Physical Therapy Session Note  Patient Details  Name: [name] [name] MRN: [id number] Date of Birth: 09/03/1941  Today's Date: 09/06/2012 Time: 1100-1200 Time Calculation (min): 60 min  Short Term Goals: Week 1:  PT Short Term Goal 1 (Week 1): = LTG  Skilled Therapeutic Interventions/Progress Updates:   Patient asleep upon entering but patient more alert and oriented this am; patient at beginning of session patient better able to follow simple commands; performed supine > sit supervision with cues for initiation.  Patient able to don shoes correctly this am with min A and cues to initiate.  Patient performed sit > stand and ambulated on unit x 150' with mod A with LOB to L during head turns and changes with direction.  Performed higher level gait training with obstacle course x 4 reps with mod A for visual and verbal cues for safety and sequence when weaving L and R around cones, stepping over low obstacle, up and down one step/curb, and over compliant surface; patient required cues to attend to lower visual field.  Performed standing balance, dual task and visual scanning training with L and R side stepping during balloon taps with bilat UE and mod A; increased difficulty with R lateral stepping .  Performed standing tolerance, balance training without UE support during word search activity for L visual scanning, attention to L, attention to task. Required multiple sitting rest breaks.  As patient fatigues patient with significantly limited attention to task, attention to L and ability to follow simple commands.  Therapy Documentation Precautions:  Precautions Precautions: Fall Restrictions Weight Bearing Restrictions: No Pain: Pain Assessment Pain Assessment: No/denies pain Locomotion : Ambulation Ambulation/Gait Assistance: 3: Mod assist   See FIM for current functional status  Therapy/Group: Individual Therapy  Edman Circle Faucette 09/06/2012, 12:20 PM

## 2012-09-06 NOTE — Progress Notes (Signed)
Patient without complaint of at HS. Denied need for tylenol or maalox. Refused laxatives, spoke with patient about importance of moving bowels on a more regular basis. LBM 09/02/12. A&O X 3 tonight. No attempts to get out of bed without assistance. Will continue to use bedalarm for safety.  HS CBG=123, ate 100% of one applesauce and one yogurt. Refused water. Calling for assistance thus far tonight vs yelling for help the night before. meds given whole in applesauce. [name] [name] A

## 2012-09-06 NOTE — Progress Notes (Signed)
Speech Language Pathology Daily Session Note and Weekly Progress Note  Patient Details  Name: Renesmay Nesbitt MRN: [id number] Date of Birth: 02-14-1941  Today's Date: 09/06/2012 Time: 2952-8413 Time Calculation (min): 45 min  Short Term Goals: Week 1: SLP Short Term Goal 1 (Week 1): Patient will tolerate Dys. 2 textures and thin liquids via teaspoon with mod assist verbal cues for use of compensatory strategies. SLP Short Term Goal 1 - Progress (Week 1): Met SLP Short Term Goal 2 (Week 1): Patient will tolerate an objective swallow study to determine presence of aspiration/penetration with min assist.   SLP Short Term Goal 2 - Progress (Week 1): Met SLP Short Term Goal 3 (Week 1): Patient will sustain attention for 5 minutes during a functional task with mod assist verbal cues for redirection.   SLP Short Term Goal 3 - Progress (Week 1): Met SLP Short Term Goal 4 (Week 1): Patient will utilize compensatory strategies to facilitate increased recall of new information with mod-max assist verbal cues.   SLP Short Term Goal 4 - Progress (Week 1): Progressing toward goal SLP Short Term Goal 5 (Week 1): Patient will solve basic problems during a familiar task with mod assist verbal cues.  SLP Short Term Goal 5 - Progress (Week 1): Met SLP Short Term Goal 6 (Week 1): Patient will demonstrate improved intellectual awareness by identifying 2 new deficits that occurred as a result of CVA. SLP Short Term Goal 6 - Progress (Week 1): Met  Skilled Therapeutic Interventions: Treatment addressed dysphagia management.  Patient consumed Dys.2 textures and thin liquids with mod faded to min assist cues to take small bites and multiple swallows per bite/sip and exhibited no overt s/s of aspiration during the session.  Trials of Dys. 3 textures revealed efficient mastication and manipulation of bolus.  Patient exhibited cough x1 which SLP suspects was caused by pharyngeal residue from increased bolus size.  Reflexive  cough appeared to clear suspected penetrates.  As a result, SLP recommends continued trials of Dys. 3 textures with tomorrow's lunch tray in order to further assess mastication with Dys. 3 meats.  Overall, patient required min-mod assist verbal cues for basic problem solving during self feeding of AM meal tray to compensate for visual deficits per husband's report.     FIM:  Comprehension Comprehension Mode: Auditory Comprehension: 5-Understands basic 90% of the time/requires cueing < 10% of the time Expression Expression Mode: Verbal Expression: 5-Expresses basic 90% of the time/requires cueing < 10% of the time. Social Interaction Social Interaction: 5-Interacts appropriately 90% of the time - Needs monitoring or encouragement for participation or interaction. Problem Solving Problem Solving: 4-Solves basic 75 - 89% of the time/requires cueing 10 - 24% of the time Memory Memory: 3-Recognizes or recalls 50 - 74% of the time/requires cueing 25 - 49% of the time FIM - Eating Eating Activity: 5: Supervision/cues;5: Set-up assist for open containers  Pain Pain Assessment Pain Assessment: No/denies pain  Therapy/Group: Individual Therapy   Speech Language Pathology Weekly Progress Note  Patient Details  Name: Mendy Chou MRN: [id number] Date of Birth: [birth year]  Today's Date: 09/06/2012 Time: 5366-4403 Time Calculation (min): 45 min  Short Term Goals: Week 1: SLP Short Term Goal 1 (Week 1): Patient will tolerate Dys. 2 textures and thin liquids via teaspoon with mod assist verbal cues for use of compensatory strategies. SLP Short Term Goal 1 - Progress (Week 1): Met SLP Short Term Goal 2 (Week 1): Patient will tolerate an objective swallow study to determine  presence of aspiration/penetration with min assist.   SLP Short Term Goal 2 - Progress (Week 1): Met SLP Short Term Goal 3 (Week 1): Patient will sustain attention for 5 minutes during a functional task with mod assist verbal  cues for redirection.   SLP Short Term Goal 3 - Progress (Week 1): Met SLP Short Term Goal 4 (Week 1): Patient will utilize compensatory strategies to facilitate increased recall of new information with mod-max assist verbal cues.   SLP Short Term Goal 4 - Progress (Week 1): Progressing toward goal SLP Short Term Goal 5 (Week 1): Patient will solve basic problems during a familiar task with mod assist verbal cues.  SLP Short Term Goal 5 - Progress (Week 1): Met SLP Short Term Goal 6 (Week 1): Patient will demonstrate improved intellectual awareness by identifying 2 new deficits that occurred as a result of CVA. SLP Short Term Goal 6 - Progress (Week 1): Met Week 2: SLP Short Term Goal 1 (Week 2): Patient will tolerate Dys. 2 textures and thin liquids with min assist verbal cues for use of compensatory strategies. SLP Short Term Goal 2 (Week 2): Patient will selectively attend to a basic functional task for approximately 10 minutes with min assist verbal cues for redirection. SLP Short Term Goal 3 (Week 2): Patient will utilize compensatory strategies to facilitate increased recall of new information with mod-max assist verbal cues. SLP Short Term Goal 4 (Week 2): Patient will solve basic problems during a familiar task with min assist verbal cues SLP Short Term Goal 5 (Week 2): Patient will demonstrate emergent awareness by asking for help as needed during basic functional tasks.    Weekly Progress Updates: Patient met 5 out of 6 short term goals this reporting period with functional gains in dysphagia management, attention, and awareness.  Patient's diet was upgraded per results of 10/30 MBS which indicated mild-moderate pharyngeal residue on all textures and liquids tested that was cleared to mild-trace residue with utilization of multiple dry swallows, and small bites/sips.   Patient is currently at an overall mod-min assist level for utilization of multiple swallows per bite/sip and taking small  bites/sips while consuming Dys. 2 textures and thin liquids.  Patient has also demonstrated intellectual awareness by identifying newly acquired deficits with supervision-min assist verbal cues; however, patient still requires mod-max assist verbal cuing to ask for help as needed, indicating a continued emergent awareness impairment.   Patient currently demonstrates sustained attention during basic familiar tasks for periods of approximately 15 minutes with supervision cuing.  Overall, patient continues to be a good candidate for skilled SLP services for cognition and dysphagia management during CIR stay in order to reduce burden of care and maximize functional independence upon discharge.     SLP Frequency: 1-2 X/day, 30-60 minutes;5 out of 7 days Estimated Length of Stay: 10-12 days SLP Treatment/Interventions: Cognitive remediation/compensation;Cueing hierarchy;Environmental controls;Functional tasks;Dysphagia/aspiration precaution training;Internal/external aids;Patient/family education;Therapeutic Activities;Speech/Language facilitation    Jackalyn Lombard, Conrad Ballantine  Graduate Clinician Speech Language Pathology  Orvin Netter, Joni Reining 09/06/2012, 10:04 AM

## 2012-09-06 NOTE — Progress Notes (Signed)
Occupational Therapy Session Note  Patient Details  Name: [name] [name] MRN: [id number] Date of Birth: 1941/03/17  Today's Date: 09/06/2012 Time: 5284-1324 Time Calculation (min): 40 min  Short Term Goals: Week 2:  OT Short Term Goal 1 (Week 2): STG = LTGs due to remaining LOS  Skilled Therapeutic Interventions/Progress Updates:    Pt seen for ADL retraining with focus on functional mobility with RW, functional transfers, and attention to Lt with obtaining bathing and grooming items.  Pt requested to bathe at shower level, educated on use of shower chair for increased safety and energy conservation.  Pt ambulated to toilet with min assist and min cues for RW placement with turns and clearing doorway.  Min cues provided with bathing at sit to stand level in walk-in shower.  Pt continues to have difficulty with perceptual deficits and problem solving with dressing, putting both legs in same leg hole and shirt on backwards.  Therapy Documentation Precautions:  Precautions Precautions: Fall Restrictions Weight Bearing Restrictions: No Pain: Pain Assessment Pain Assessment: No/denies pain  See FIM for current functional status  Therapy/Group: Individual Therapy  Leonette Monarch 09/06/2012, 11:27 AM

## 2012-09-06 NOTE — Progress Notes (Signed)
Physical Therapy Note  Patient Details  Name: [name] [name] MRN: [id number] Date of Birth: May 09, 1941 Today's Date: 09/06/2012  4098-1191 (40 minutes) individual Pain: no complaint of pain Focus of treatment: gait training ; therapeutic exercises focused on activity tolerance Treatment: Nustep Level 3 X 10 minutes ; gait 150 feet RW  min assist + mod vcs to avoid objects and to correct direction of AD.; gait without AD 150 feet with moderate staggering to left or right / intermittent assist to avoid uncorrected loss of balance.    Tita Terhaar,JIM 09/06/2012, 7:42 AM

## 2012-09-06 NOTE — Progress Notes (Signed)
Patient ID: [name] [name], female   DOB: August 10, 1941, 71 y.o.   MRN: [id number] Subjective/Complaints: [name] [name] is a 71 y.o. female with history of DM, fall 10 days PTA on 08/18/12 with worsening of HA and 3 day history of running into things on the left with gait problems. Was taking ASA q 6 hours for headaches. MRI head done on outpatient basis with right posterior temporo-occipital hemorrhage and patient admitted for workup. Patient developed seizure on 10/17 pm and was loaded with dilantin and started on Keppra. EEG done revealing moderately severe, continuous nonspecific slowing of cerebral activity with question of encephalopathy v/s post ictal state. She did develop fever with hypotension and patient intubated on 10/18. CCM consulted for input and patient started on IV zosyn briefly for presumed aspiration PNA.   Awakens easily   Review of Systems  Respiratory: Negative for shortness of breath.   Neurological: Positive for weakness. Negative for seizures.  All other systems reviewed and are negative.       Objective: Vital Signs: Blood pressure 123/71, pulse 74, temperature 99 F (37.2 C), temperature source Oral, resp. rate 17, weight 50 kg (110 lb 3.7 oz), SpO2 98.00%. Gen: NAD Mood/affect slow to respond no agitation Lungs:  Clear Cor:  RRR Abd:  +BS soft NT Ext -C/C/E Motor 4/5 in BUE and BLE Orientation X 3, recognizes me as "Dr."        Results for orders placed during the hospital encounter of 08/27/12 (from the past 72 hour(s))  GLUCOSE, CAPILLARY     Status: Abnormal   Collection Time   09/03/12 11:56 AM      Component Value Range Comment   Glucose-Capillary 236 (*) 70 - 99 mg/dL   GLUCOSE, CAPILLARY     Status: Normal   Collection Time   09/03/12  4:26 PM      Component Value Range Comment   Glucose-Capillary 89  70 - 99 mg/dL    Comment 1 Notify RN     GLUCOSE, CAPILLARY     Status: Abnormal   Collection Time   09/03/12  7:20 PM      Component Value Range Comment   Glucose-Capillary 54 (*) 70 - 99 mg/dL    Comment 1 Notify RN     GLUCOSE, CAPILLARY     Status: Normal   Collection Time   09/03/12  8:00 PM      Component Value Range Comment   Glucose-Capillary 70  70 - 99 mg/dL    Comment 1 Notify RN     GLUCOSE, CAPILLARY     Status: Abnormal   Collection Time   09/04/12  1:25 AM      Component Value Range Comment   Glucose-Capillary 151 (*) 70 - 99 mg/dL    Comment 1 Notify RN     GLUCOSE, CAPILLARY     Status: Abnormal   Collection Time   09/04/12  7:36 AM      Component Value Range Comment   Glucose-Capillary 101 (*) 70 - 99 mg/dL    Comment 1 Notify RN     GLUCOSE, CAPILLARY     Status: Abnormal   Collection Time   09/04/12 10:53 AM      Component Value Range Comment   Glucose-Capillary 304 (*) 70 - 99 mg/dL   GLUCOSE, CAPILLARY     Status: Normal   Collection Time   09/04/12  4:26 PM      Component Value Range Comment   Glucose-Capillary 97  70 -  99 mg/dL    Comment 1 Notify RN     GLUCOSE, CAPILLARY     Status: Abnormal   Collection Time   09/04/12  8:25 PM      Component Value Range Comment   Glucose-Capillary 161 (*) 70 - 99 mg/dL    Comment 1 Notify RN     CBC     Status: Abnormal   Collection Time   09/05/12  6:20 AM      Component Value Range Comment   WBC 12.5 (*) 4.0 - 10.5 K/uL    RBC 3.51 (*) 3.87 - 5.11 MIL/uL    Hemoglobin 10.7 (*) 12.0 - 15.0 g/dL    HCT 11.9 (*) 14.7 - 46.0 %    MCV 96.0  78.0 - 100.0 fL    MCH 30.5  26.0 - 34.0 pg    MCHC 31.8  30.0 - 36.0 g/dL    RDW 82.9  56.2 - 13.0 %    Platelets 602 (*) 150 - 400 K/uL   BASIC METABOLIC PANEL     Status: Abnormal   Collection Time   09/05/12  6:20 AM      Component Value Range Comment   Sodium 136  135 - 145 mEq/L    Potassium 4.4  3.5 - 5.1 mEq/L    Chloride 95 (*) 96 - 112 mEq/L    CO2 31  19 - 32 mEq/L    Glucose, Bld 236 (*) 70 - 99 mg/dL    BUN 21  6 - 23 mg/dL    Creatinine, Ser 8.65 (*) 0.50 - 1.10 mg/dL    Calcium 8.9  8.4 - 78.4 mg/dL    GFR  calc non Af Amer 41 (*) >90 mL/min    GFR calc Af Amer 48 (*) >90 mL/min   GLUCOSE, CAPILLARY     Status: Abnormal   Collection Time   09/05/12  7:58 AM      Component Value Range Comment   Glucose-Capillary 218 (*) 70 - 99 mg/dL    Comment 1 Notify RN     GLUCOSE, CAPILLARY     Status: Abnormal   Collection Time   09/05/12 12:05 PM      Component Value Range Comment   Glucose-Capillary 166 (*) 70 - 99 mg/dL    Comment 1 Notify RN     GLUCOSE, CAPILLARY     Status: Abnormal   Collection Time   09/05/12  4:00 PM      Component Value Range Comment   Glucose-Capillary 119 (*) 70 - 99 mg/dL    Comment 1 Notify RN     GLUCOSE, CAPILLARY     Status: Abnormal   Collection Time   09/05/12  9:26 PM      Component Value Range Comment   Glucose-Capillary 123 (*) 70 - 99 mg/dL    Comment 1 Notify RN        HEENT: normal Cardio: RRR Resp: CTA B/L GI: BS positive Extremity:  Pulses positive Skin:   Intact Neuro: Awake but less alert, Cranial Nerve II-XII normal, Abnormal Sensory reduced on L , Normal Motor, Abnormal FMC Ataxic/ dec FMC, Inattention on L mild, apraxia Musc/Skel:  Normal Oriented to person place and situation. Converses nicely. Very alert. laughing   Assessment/Plan: 1. Functional deficits secondary to R temporo-occipital ICH which require 3+ hours per day of interdisciplinary therapy in a comprehensive inpatient rehab setting. Physiatrist is providing close team supervision and 24 hour management of active medical  problems listed below. Physiatrist and rehab team continue to assess barriers to discharge/monitor patient progress toward functional and medical goals. FIM: FIM - Bathing Bathing Steps Patient Completed: Chest;Right Arm;Left Arm;Abdomen;Front perineal area;Buttocks;Right upper leg;Left upper leg;Right lower leg (including foot);Left lower leg (including foot) Bathing: 4: Steadying assist  FIM - Upper Body Dressing/Undressing Upper body dressing/undressing  steps patient completed: Thread/unthread right sleeve of pullover shirt/dresss;Thread/unthread left sleeve of pullover shirt/dress;Put head through opening of pull over shirt/dress;Pull shirt over trunk Upper body dressing/undressing: 5: Set-up assist to: Obtain clothing/put away FIM - Lower Body Dressing/Undressing Lower body dressing/undressing steps patient completed: Thread/unthread right underwear leg;Thread/unthread left underwear leg;Pull underwear up/down;Thread/unthread right pants leg;Thread/unthread left pants leg;Pull pants up/down;Don/Doff right sock;Don/Doff left sock Lower body dressing/undressing: 4: Steadying Assist  FIM - Toileting Toileting steps completed by patient: Adjust clothing prior to toileting;Performs perineal hygiene;Adjust clothing after toileting Toileting Assistive Devices: Grab bar or rail for support Toileting: 4: Steadying assist  FIM - Diplomatic Services operational officer Devices: Art gallery manager Transfers: 4-To toilet/BSC: Min A (steadying Pt. > 75%)  FIM - Banker Devices: Walker;Arm rests;Bed rails Bed/Chair Transfer: 4: Supine > Sit: Min A (steadying Pt. > 75%/lift 1 leg);4: Bed > Chair or W/C: Min A (steadying Pt. > 75%);4: Chair or W/C > Bed: Min A (steadying Pt. > 75%)  FIM - Locomotion: Wheelchair Distance: 150 Locomotion: Wheelchair: 0: Activity did not occur FIM - Locomotion: Ambulation Locomotion: Ambulation Assistive Devices: Designer, industrial/product Ambulation/Gait Assistance: 3: Mod assist Locomotion: Ambulation: 3: Travels 150 ft or more with moderate assistance (Pt: 50 - 74%)  Comprehension Comprehension Mode: Auditory Comprehension: 4-Understands basic 75 - 89% of the time/requires cueing 10 - 24% of the time  Expression Expression Mode: Verbal Expression: 4-Expresses basic 75 - 89% of the time/requires cueing 10 - 24% of the time. Needs helper to occlude trach/needs to repeat words.  Social  Interaction Social Interaction: 5-Interacts appropriately 90% of the time - Needs monitoring or encouragement for participation or interaction.  Problem Solving Problem Solving: 2-Solves basic 25 - 49% of the time - needs direction more than half the time to initiate, plan or complete simple activities  Memory Memory: 3-Recognizes or recalls 50 - 74% of the time/requires cueing 25 - 49% of the time   Medical Problem List and Plan:  1. DVT Prophylaxis/Anticoagulation: Mechanical: Sequential compression devices, below knee Bilateral lower extremities  2. Pain Management: N/A  3. Mood: difficult to judge at this time due to impaired cognition and poor awareness.  4. Neuropsych: This patient is not capable of making decisions on his/her own behalf.  5. DM type 2 insulin dependent : BS have been poorly controlled. Continue levimir with meal coverage and SSI. Monitor CBG AC/HS checks. Hypoglycemia likely due to novalog.  Glucophage XR just started 6. Orthostasis: will check ortho static BP in am and set parameters for BP meds.  7. New onset seizures: Continue keppra 250 mg bid. Monitor for recurrence. No signs thus far 8. Resting tachycardia: likely due to deconditioning. Will monitor with bid checks and patient symptoms.  9. Hypokalemia: resolved.  10. ABLA: continue to monitor for trends.  .  11.leucocytosis  No fever 12. Dysphagia: Will nocturnal IVF.D2 thin 13.  Pneumonia vs pleural effusion, lungs sound f/u Xray showed no infiltrates or effusion.  Afebrile off Avelox 13.  GERD, on protonix, troponins neg 14. Lethargy intermittent Parieto occipital infarct  may wax and wane related to sleep, fatigue from activity, etc  LOS (Days) 10 A FACE TO FACE EVALUATION WAS PERFORMED  KIRSTEINS,ANDREW E 09/06/2012, 7:19 AM

## 2012-09-07 ENCOUNTER — Inpatient Hospital Stay (HOSPITAL_COMMUNITY): Payer: Medicare Other | Admitting: Physical Therapy

## 2012-09-07 ENCOUNTER — Inpatient Hospital Stay (HOSPITAL_COMMUNITY): Payer: Medicare Other | Admitting: Occupational Therapy

## 2012-09-07 ENCOUNTER — Inpatient Hospital Stay (HOSPITAL_COMMUNITY): Payer: Medicare Other | Admitting: Speech Pathology

## 2012-09-07 DIAGNOSIS — I69991 Dysphagia following unspecified cerebrovascular disease: Secondary | ICD-10-CM

## 2012-09-07 DIAGNOSIS — R569 Unspecified convulsions: Secondary | ICD-10-CM

## 2012-09-07 DIAGNOSIS — I619 Nontraumatic intracerebral hemorrhage, unspecified: Secondary | ICD-10-CM

## 2012-09-07 LAB — BASIC METABOLIC PANEL
CO2: 33 mEq/L — ABNORMAL HIGH (ref 19–32)
Calcium: 9.5 mg/dL (ref 8.4–10.5)
Chloride: 92 mEq/L — ABNORMAL LOW (ref 96–112)
Creatinine, Ser: 1.34 mg/dL — ABNORMAL HIGH (ref 0.50–1.10)
Glucose, Bld: 162 mg/dL — ABNORMAL HIGH (ref 70–99)

## 2012-09-07 LAB — GLUCOSE, CAPILLARY
Glucose-Capillary: 289 mg/dL — ABNORMAL HIGH (ref 70–99)
Glucose-Capillary: 134 mg/dL — ABNORMAL HIGH (ref 70–99)

## 2012-09-07 LAB — URINE CULTURE

## 2012-09-07 MED ORDER — SENNOSIDES-DOCUSATE SODIUM 8.6-50 MG PO TABS
1.0000 | ORAL_TABLET | Freq: Every day | ORAL | Status: DC
  Administered 2012-09-09 – 2012-09-10 (×2): 1 via ORAL
  Filled 2012-09-07 (×3): qty 1

## 2012-09-07 MED ORDER — NITROFURANTOIN MONOHYD MACRO 100 MG PO CAPS
100.0000 mg | ORAL_CAPSULE | Freq: Two times a day (BID) | ORAL | Status: DC
  Administered 2012-09-07 (×2): 100 mg via ORAL
  Filled 2012-09-07 (×6): qty 1

## 2012-09-07 MED ORDER — SODIUM CHLORIDE 0.9 % IV SOLN
Freq: Once | INTRAVENOUS | Status: AC
  Administered 2012-09-07: 11:00:00 via INTRAVENOUS

## 2012-09-07 NOTE — Progress Notes (Signed)
Called to the room--patient on commode and unresponsive for approximately for  2 mins.  Aroused when transferred to chair. Talking appropriately.  Heart rate appeared RRR.  BP taken once back in bed and elevated.  EKG ordered.  Likely vaso vagal episode due to defecation.  As this is second episode will get cardiology input regarding any additional workup. Will hydrate with IVF X one liter. Dr. Sharyn Lull consulted for input.

## 2012-09-07 NOTE — Progress Notes (Signed)
Physical Therapy Session Note  Patient Details  Name: [name] [name] MRN: [id number] Date of Birth: [birth year]  Today's Date: 09/07/2012 Time: 7829-5621 Time Calculation (min): 45 min  Short Term Goals: Week 1:  PT Short Term Goal 1 (Week 1): = LTG  Skilled Therapeutic Interventions/Progress Updates:   Patient just started on IV fluids secondary to episode of being unresponsive this am.  No reports of dizziness.  Reviewed simulated car transfer with patient verbalizing sequence first and then performing sequence one step at a time with improved sequencing from patient; first repetition patient attempted to place feet in seat first; demonstrated to patient how to sit buttocks on car first and then bring LE into car; second repetition patient performed with supervision-min A and min-mod verbal cues for sequence.  Attempted to perform dynamic standing balance training with forward, lateral and retro stepping over low obstacle (4 square); performed one repetition with mod-max A secondary to lateral LOB when patient began to c/o sudden HA.  Returned to sitting and vitals assessed and CBG assessed.  Patient transferred to day room for lunch with SLP.  Therapy Documentation Precautions:  Precautions Precautions: Fall Restrictions Weight Bearing Restrictions: No Vital Signs:  BP in sitting 111/57, HR 80; standing 103/74, HR 85 no reports of dizziness.  BP when reporting HA:105/66, 77 bpm.   Pain:  During standing balance patient reported sudden HA; RN notified and CBG assessed; RN states that patient was premedicated for HA prior to PT session; patient does not recall this. Locomotion : Ambulation Ambulation/Gait Assistance: 3: Mod assist (HHA)   See FIM for current functional status  Therapy/Group: Individual Therapy  Edman Circle Faucette 09/07/2012, 11:45 AM

## 2012-09-07 NOTE — Progress Notes (Signed)
Speech Language Pathology Daily Session Note  Patient Details  Name: [name] [name] MRN: [id number] Date of Birth: 02-13-1941  Today's Date: 09/07/2012 Time: 1130-1225 Time Calculation (min): 55 min  Short Term Goals: Week 2: SLP Short Term Goal 1 (Week 2): Patient will tolerate Dys. 2 textures and thin liquids with min assist verbal cues for use of compensatory strategies. SLP Short Term Goal 2 (Week 2): Patient will selectively attend to a basic functional task for approximately 10 minutes with min assist verbal cues for redirection. SLP Short Term Goal 3 (Week 2): Patient will utilize compensatory strategies to facilitate increased recall of new information with mod-max assist verbal cues. SLP Short Term Goal 4 (Week 2): Patient will solve basic problems during a familiar task with min assist verbal cues SLP Short Term Goal 5 (Week 2): Patient will demonstrate emergent awareness by asking for help as needed during basic functional tasks.    Skilled Therapeutic Interventions: Treatment addressed dysphagia management and cognition. Patient required set up assistance with lunch tray to compensate for visuospatial deficits Patient consumed Dys. 2 textures and thin liquids with min assist cues to take small bites and multiple swallows per bite/sip and exhibited no overt s/s of aspiration during the session.  Trials of regular textures revealed efficient mastication and timely A-P transit and initiation of swallow.  As a result, SLP recommends a diet upgrade to Dys. 3 textures with continued full supervision to further assess mastication during a variety of functional self-feeding tasks.   Patient selectively attended to a basic familiar task for approximately 30 minutes in a moderately distracting environment with supervision cues for redirection.    FIM:  Comprehension Comprehension Mode: Auditory Comprehension: 4-Understands basic 75 - 89% of the time/requires cueing 10 - 24% of the  time Expression Expression Mode: Verbal Expression: 4-Expresses basic 75 - 89% of the time/requires cueing 10 - 24% of the time. Needs helper to occlude trach/needs to repeat words. Social Interaction Social Interaction: 5-Interacts appropriately 90% of the time - Needs monitoring or encouragement for participation or interaction. Problem Solving Problem Solving: 4-Solves basic 75 - 89% of the time/requires cueing 10 - 24% of the time Memory Memory: 3-Recognizes or recalls 50 - 74% of the time/requires cueing 25 - 49% of the time FIM - Eating Eating Activity: 5: Supervision/cues;5: Set-up assist for open containers  Pain Pain Assessment Pain Assessment: No/denies pain  Therapy/Group: Individual Therapy  Jackalyn Lombard, [place]  Graduate Clinician Speech Language Pathology   Shauntelle Jamerson, Joni Reining 09/07/2012, 1:09 PM

## 2012-09-07 NOTE — Progress Notes (Signed)
Occupational Therapy Session Note  Patient Details  Name: [name] [name] MRN: [id number] Date of Birth: 05-May-1941  Today's Date: 09/07/2012 Time: 0950-1030 Time Calculation (min): 40 min  Short Term Goals: Week 2:  OT Short Term Goal 1 (Week 2): STG = LTGs due to remaining LOS  Skilled Therapeutic Interventions/Progress Updates:    Pt seen for ADL retraining with focus on dynamic standing balance, initiation and sequencing, and attention to Lt with self-care tasks of bathing and dressing.  Pt reports feeling "like a wet dish rag" upon arrival, secondary to having ?vaso vagal episode.  However post encouragement from this therapist and PA, pt willing to participate in bathing at sink.  Pt continues to have visual deficits and difficulty with perceptual deficits with donning shirt and pants, requiring cues to redirect and orient clothing to improve success with dressing tasks.  Pt light min assist - close supervision when standing to complete perineal hygiene and pulling up pants.    Therapy Documentation Precautions:  Precautions Precautions: Fall Restrictions Weight Bearing Restrictions: No Pain: Pain Assessment Pain Assessment: No/denies pain  See FIM for current functional status  Therapy/Group: Individual Therapy  Leonette Monarch 09/07/2012, 3:11 PM

## 2012-09-07 NOTE — Progress Notes (Signed)
Physical Therapy Session Note  Patient Details  Name: [name] [name] MRN: [id number] Date of Birth: 1941/04/16  Today's Date: 09/07/2012 Time: 9629-5284 Time Calculation (min): 45 min   Short Term Goals: Week 1 = LTG  Therapy Documentation Precautions:  Precautions Precautions: Fall Restrictions Weight Bearing Restrictions: No Pain: Pain Assessment Pain Assessment: No/denies pain Locomotion : High Level Ambulation High Level Ambulation: Side stepping;Backwards walking;Direction changes;Head turns Side Stepping: On 4 square and then around cones to L and R with mod-max A overall for attention to obstacles, focus attention to task and to maintain balance Backwards Walking: On 4 square and around cones with mod-max A for sequencing and balance Direction Changes: During ambulation in controlled environment with mod A and verbal cues for correct direction change and for lateral stability when turning Head Turns: Attempted to have patient perform vertical and horizontal head turns during gait in controlled environment to locate various objects on unit and find room; patient unable even with max-total verbal and visual cues secondary to fatigue and internally distracted by fatigue and neck pain; patient also reporting feeling "hot" and about to pass out.  Placed patient in supine and patient reported symptoms resolving.  Other Treatments: Treatments Therapeutic Activity: Patient asleep in w/c; husband assisted with donning shoes.  Patient reporting need to use toilet.  Performed toilet transfer with min-mod HHA and min A for balance during doffing and donning of clothing and hygiene.  Patient required min A for balance while washing hands and no verbal cues to locate items on sink.  Upon returning to room patient performed transfer sit > supine on flat bed, no rails with min A overall.  See FIM for current functional status  Therapy/Group: Individual Therapy  Edman Circle [facility] 09/07/2012,  3:47 PM

## 2012-09-07 NOTE — Progress Notes (Signed)
The skilled treatment note has been reviewed and SLP is in agreement. Vinessa Macconnell, M.A., CCC-SLP [phone]  

## 2012-09-07 NOTE — Progress Notes (Signed)
Patient ID: [name] [name], female   DOB: [birth year], 71 y.o.   MRN: [id number] Subjective/Complaints: [name] [name] is a 71 y.o. female with history of DM, fall 10 days PTA on 08/18/12 with worsening of HA and 3 day history of running into things on the left with gait problems. Was taking ASA q 6 hours for headaches. MRI head done on outpatient basis with right posterior temporo-occipital hemorrhage and patient admitted for workup. Patient developed seizure on 10/17 pm and was loaded with dilantin and started on Keppra. EEG done revealing moderately severe, continuous nonspecific slowing of cerebral activity with question of encephalopathy v/s post ictal state. She did develop fever with hypotension and patient intubated on 10/18. CCM consulted for input and patient started on IV zosyn briefly for presumed aspiration PNA.   Awakens easily   Review of Systems  Respiratory: Negative for shortness of breath.   Neurological: Positive for weakness. Negative for seizures.  All other systems reviewed and are negative.       Objective: Vital Signs: Blood pressure 112/71, pulse 71, temperature 98.7 F (37.1 C), temperature source Oral, resp. rate 18, weight 47.9 kg (105 lb 9.6 oz), SpO2 98.00%. Gen: NAD Mood/affect slow to respond no agitation Lungs:  Clear Cor:  RRR Abd:  +BS soft NT Ext -C/C/E Motor 4/5 in BUE and BLE Orientation X 3, recognizes me as "Dr."        Results for orders placed during the hospital encounter of 08/27/12 (from the past 72 hour(s))  GLUCOSE, CAPILLARY     Status: Abnormal   Collection Time   09/04/12  7:36 AM      Component Value Range Comment   Glucose-Capillary 101 (*) 70 - 99 mg/dL    Comment 1 Notify RN     GLUCOSE, CAPILLARY     Status: Abnormal   Collection Time   09/04/12 10:53 AM      Component Value Range Comment   Glucose-Capillary 304 (*) 70 - 99 mg/dL   GLUCOSE, CAPILLARY     Status: Normal   Collection Time   09/04/12  4:26 PM      Component Value Range  Comment   Glucose-Capillary 97  70 - 99 mg/dL    Comment 1 Notify RN     GLUCOSE, CAPILLARY     Status: Abnormal   Collection Time   09/04/12  8:25 PM      Component Value Range Comment   Glucose-Capillary 161 (*) 70 - 99 mg/dL    Comment 1 Notify RN     CBC     Status: Abnormal   Collection Time   09/05/12  6:20 AM      Component Value Range Comment   WBC 12.5 (*) 4.0 - 10.5 K/uL    RBC 3.51 (*) 3.87 - 5.11 MIL/uL    Hemoglobin 10.7 (*) 12.0 - 15.0 g/dL    HCT 40.9 (*) 81.1 - 46.0 %    MCV 96.0  78.0 - 100.0 fL    MCH 30.5  26.0 - 34.0 pg    MCHC 31.8  30.0 - 36.0 g/dL    RDW 91.4  78.2 - 95.6 %    Platelets 602 (*) 150 - 400 K/uL   BASIC METABOLIC PANEL     Status: Abnormal   Collection Time   09/05/12  6:20 AM      Component Value Range Comment   Sodium 136  135 - 145 mEq/L    Potassium 4.4  3.5 - 5.1 mEq/L  Chloride 95 (*) 96 - 112 mEq/L    CO2 31  19 - 32 mEq/L    Glucose, Bld 236 (*) 70 - 99 mg/dL    BUN 21  6 - 23 mg/dL    Creatinine, Ser 1.61 (*) 0.50 - 1.10 mg/dL    Calcium 8.9  8.4 - 09.6 mg/dL    GFR calc non Af Amer 41 (*) >90 mL/min    GFR calc Af Amer 48 (*) >90 mL/min   GLUCOSE, CAPILLARY     Status: Abnormal   Collection Time   09/05/12  7:58 AM      Component Value Range Comment   Glucose-Capillary 218 (*) 70 - 99 mg/dL    Comment 1 Notify RN     GLUCOSE, CAPILLARY     Status: Abnormal   Collection Time   09/05/12 12:05 PM      Component Value Range Comment   Glucose-Capillary 166 (*) 70 - 99 mg/dL    Comment 1 Notify RN     GLUCOSE, CAPILLARY     Status: Abnormal   Collection Time   09/05/12  4:00 PM      Component Value Range Comment   Glucose-Capillary 119 (*) 70 - 99 mg/dL    Comment 1 Notify RN     GLUCOSE, CAPILLARY     Status: Abnormal   Collection Time   09/05/12  9:26 PM      Component Value Range Comment   Glucose-Capillary 123 (*) 70 - 99 mg/dL    Comment 1 Notify RN     GLUCOSE, CAPILLARY     Status: Abnormal   Collection Time    09/06/12  7:10 AM      Component Value Range Comment   Glucose-Capillary 131 (*) 70 - 99 mg/dL    Comment 1 Notify RN     GLUCOSE, CAPILLARY     Status: Abnormal   Collection Time   09/06/12 12:00 PM      Component Value Range Comment   Glucose-Capillary 191 (*) 70 - 99 mg/dL    Comment 1 Notify RN     GLUCOSE, CAPILLARY     Status: Normal   Collection Time   09/06/12  4:23 PM      Component Value Range Comment   Glucose-Capillary 79  70 - 99 mg/dL   GLUCOSE, CAPILLARY     Status: Normal   Collection Time   09/06/12  9:16 PM      Component Value Range Comment   Glucose-Capillary 97  70 - 99 mg/dL      HEENT: normal Cardio: RRR Resp: CTA B/L GI: BS positive Extremity:  Pulses positive Skin:   Intact Neuro: Awake but less alert, Cranial Nerve II-XII normal, Abnormal Sensory reduced on L , Normal Motor, Abnormal FMC Ataxic/ dec FMC, Inattention on L visual and tactile, L HH, apraxia Musc/Skel:  Normal Oriented to person place and situation. Converses nicely. Very alert. laughing   Assessment/Plan: 1. Functional deficits secondary to R temporo-occipital ICH which require 3+ hours per day of interdisciplinary therapy in a comprehensive inpatient rehab setting. Physiatrist is providing close team supervision and 24 hour management of active medical problems listed below. Physiatrist and rehab team continue to assess barriers to discharge/monitor patient progress toward functional and medical goals. FIM: FIM - Bathing Bathing Steps Patient Completed: Chest;Right Arm;Left Arm;Abdomen;Front perineal area;Buttocks;Right upper leg;Left upper leg;Right lower leg (including foot);Left lower leg (including foot) Bathing: 4: Steadying assist  FIM - Upper Body  Dressing/Undressing Upper body dressing/undressing steps patient completed: Thread/unthread right sleeve of pullover shirt/dresss;Thread/unthread left sleeve of pullover shirt/dress;Put head through opening of pull over shirt/dress;Pull  shirt over trunk Upper body dressing/undressing: 5: Set-up assist to: Obtain clothing/put away FIM - Lower Body Dressing/Undressing Lower body dressing/undressing steps patient completed: Thread/unthread right underwear leg;Thread/unthread left underwear leg;Pull underwear up/down;Thread/unthread right pants leg;Thread/unthread left pants leg;Pull pants up/down;Don/Doff right sock;Don/Doff left sock Lower body dressing/undressing: 5: Set-up assist to: Obtain clothing  FIM - Toileting Toileting steps completed by patient: Adjust clothing prior to toileting;Performs perineal hygiene;Adjust clothing after toileting Toileting Assistive Devices: Grab bar or rail for support Toileting: 4: Steadying assist  FIM - Diplomatic Services operational officer Devices: Best boy Transfers: 4-To toilet/BSC: Min A (steadying Pt. > 75%);4-From toilet/BSC: Min A (steadying Pt. > 75%)  FIM - Banker Devices: Walker;Bed rails Bed/Chair Transfer: 5: Supine > Sit: Supervision (verbal cues/safety issues);4: Sit > Supine: Min A (steadying pt. > 75%/lift 1 leg);4: Bed > Chair or W/C: Min A (steadying Pt. > 75%)  FIM - Locomotion: Wheelchair Distance: 150 Locomotion: Wheelchair: 0: Activity did not occur FIM - Locomotion: Ambulation Locomotion: Ambulation Assistive Devices: Designer, industrial/product Ambulation/Gait Assistance: 3: Mod assist Locomotion: Ambulation: 3: Travels 150 ft or more with moderate assistance (Pt: 50 - 74%)  Comprehension Comprehension Mode: Auditory Comprehension: 4-Understands basic 75 - 89% of the time/requires cueing 10 - 24% of the time  Expression Expression Mode: Verbal Expression: 5-Expresses basic 90% of the time/requires cueing < 10% of the time.  Social Interaction Social Interaction: 5-Interacts appropriately 90% of the time - Needs monitoring or encouragement for participation or interaction.  Problem Solving Problem  Solving: 4-Solves basic 75 - 89% of the time/requires cueing 10 - 24% of the time  Memory Memory: 3-Recognizes or recalls 50 - 74% of the time/requires cueing 25 - 49% of the time   Medical Problem List and Plan:  1. DVT Prophylaxis/Anticoagulation: Mechanical: Sequential compression devices, below knee Bilateral lower extremities  2. Pain Management: N/A  3. Mood: difficult to judge at this time due to impaired cognition and poor awareness.  4. Neuropsych: This patient is not capable of making decisions on his/her own behalf.  5. DM type 2 insulin dependent : BS have been poorly controlled. Continue levimir with meal coverage and SSI. Monitor CBG AC/HS checks. Hypoglycemia likely due to novalog.  Glucophage XR just started 6. Orthostasis: will check ortho static BP in am and set parameters for BP meds.  7. New onset seizures: Continue keppra 250 mg bid. Monitor for recurrence. No signs thus far 8. Resting tachycardia: likely due to deconditioning. Will monitor with bid checks and patient symptoms.  9. Hypokalemia: resolved.  10. ABLA: continue to monitor for trends.  .  11.leucocytosis  No fever 12. Dysphagia: Will nocturnal IVF.D2 thin 13.  Pneumonia vs pleural effusion, lungs sound f/u Xray showed no infiltrates or effusion.  Afebrile off Avelox 13.  GERD, on protonix, troponins neg 14. Lethargy intermittent Parieto occipital infarct  may wax and wane related to sleep, fatigue from activity, etc LOS (Days) 11 A FACE TO FACE EVALUATION WAS PERFORMED  [name] [name] E 09/07/2012, 7:18 AM

## 2012-09-07 NOTE — Plan of Care (Signed)
Problem: RH BOWEL ELIMINATION Goal: RH STG MANAGE BOWEL WITH ASSISTANCE STG Manage Bowel with Assistance. Mod I  Outcome: Progressing No incontinent episode     

## 2012-09-07 NOTE — Plan of Care (Signed)
Problem: RH BLADDER ELIMINATION Goal: RH STG MANAGE BLADDER WITH ASSISTANCE STG Manage Bladder With Assistance. Mod I  Outcome: Progressing No incontinent episode     

## 2012-09-08 ENCOUNTER — Inpatient Hospital Stay (HOSPITAL_COMMUNITY): Payer: Medicare Other | Admitting: Physical Therapy

## 2012-09-08 ENCOUNTER — Inpatient Hospital Stay (HOSPITAL_COMMUNITY): Payer: Medicare Other | Admitting: Speech Pathology

## 2012-09-08 ENCOUNTER — Inpatient Hospital Stay (HOSPITAL_COMMUNITY): Payer: Medicare Other | Admitting: Occupational Therapy

## 2012-09-08 DIAGNOSIS — R4020 Unspecified coma: Secondary | ICD-10-CM

## 2012-09-08 DIAGNOSIS — R55 Syncope and collapse: Secondary | ICD-10-CM

## 2012-09-08 LAB — GLUCOSE, CAPILLARY
Glucose-Capillary: 102 mg/dL — ABNORMAL HIGH (ref 70–99)
Glucose-Capillary: 72 mg/dL (ref 70–99)
Glucose-Capillary: 185 mg/dL — ABNORMAL HIGH (ref 70–99)

## 2012-09-08 MED ORDER — METOPROLOL TARTRATE 25 MG PO TABS
25.0000 mg | ORAL_TABLET | Freq: Two times a day (BID) | ORAL | Status: DC
  Administered 2012-09-08 – 2012-09-10 (×4): 25 mg via ORAL
  Filled 2012-09-08 (×7): qty 1

## 2012-09-08 MED ORDER — SODIUM CHLORIDE 0.45 % IV SOLN
INTRAVENOUS | Status: DC
  Administered 2012-09-08: 75 mL/h via INTRAVENOUS
  Administered 2012-09-09: 18:00:00 via INTRAVENOUS

## 2012-09-08 NOTE — Progress Notes (Signed)
Physical Therapy Session Note  Patient Details  Name: [name] [name] MRN: [id number] Date of Birth: 08/01/1941  Today's Date: 09/08/2012 Time: 1400-1440 Time Calculation (min): 40 min   Therapy Documentation Precautions:  Precautions Precautions: Fall Restrictions Weight Bearing Restrictions: No Pain: Pain Assessment Pain Assessment: No/denies pain Locomotion : Ambulation Ambulation/Gait Assistance: 4: Min assist   Other Treatments: Treatments Therapeutic Activity: Husband present for family education; demonstrated to husband how patient performs w/c <> car transfer with RW with min A for safety and sequencing with RW and how to cue patient to sit first and then place LE into car; patient and husband gave repeat demonstration with RW but patient had one LOB when pivoting to return to w/c and husband had to provide max A to regain balance.  Husband feels that patient may be safer to transfer to/from car with HHA; had patient and husband demonstrate safe w/c <> car transfer with HHA with no LOB.  Demonstrated to husband how to break down chair for storage in the car and how to set up w/c for transfer.  Demonstrated to husband how patient performs one step up for home entry/exit with RW with min A and verbal cues for safety and sequence.  Husband and patient gave repeat demonstration wtih RW and then with HHA to ascend and descend one step with verbal cues given to husband for hand placement to best assist patient.  Patient performed gait on unit with RW and min A for balance when changing directions in controlled environment with verbal cues for guidance; no LOB today.  Patient returned to bed with supervision.  Discussed with husband other areas to focus on for D/C; will continue to practice gait with RW over carpet, home obstacles and floor transfer.    See FIM for current functional status  Therapy/Group: Individual Therapy  Edman Circle [facility] 09/08/2012, 3:47 PM

## 2012-09-08 NOTE — Progress Notes (Signed)
Speech Language Pathology Weekly Progress Note  Patient Details  Name: [name] [name] MRN: [id number] Date of Birth: June 16, 1941  Today's Date: 09/08/2012  Short Term Goals: Week 2: SLP Short Term Goal 1 (Week 2): Patient will tolerate Dys. 2 textures and thin liquids with min assist verbal cues for use of compensatory strategies. SLP Short Term Goal 1 - Progress (Week 2): Met SLP Short Term Goal 2 (Week 2): Patient will selectively attend to a basic functional task for approximately 10 minutes with min assist verbal cues for redirection. SLP Short Term Goal 2 - Progress (Week 2): Progressing toward goal SLP Short Term Goal 3 (Week 2): Patient will utilize compensatory strategies to facilitate increased recall of new information with mod-max assist verbal cues. SLP Short Term Goal 3 - Progress (Week 2): Progressing toward goal SLP Short Term Goal 4 (Week 2): Patient will solve basic problems during a familiar task with min assist verbal cues SLP Short Term Goal 4 - Progress (Week 2): Progressing toward goal SLP Short Term Goal 5 (Week 2): Patient will demonstrate emergent awareness by asking for help as needed during basic functional tasks.   SLP Short Term Goal 5 - Progress (Week 2): Progressing toward goal Week 3: SLP Short Term Goal 1 (Week 3): Patient will tolerate Dys. 3 textures and thin liquids with min assist verbal cues for use of compensatory strategies.  SLP Short Term Goal 2 (Week 3): Patient will selectively attend to a basic functional task for approximately 10 minutes with min assist verbal cues for redirection.  SLP Short Term Goal 3 (Week 3): Patient will utilize compensatory strategies to facilitate increased recall of new information with mod-max assist verbal cues. SLP Short Term Goal 4 (Week 3): Patient will solve basic problems during a familiar task with min assist verbal cues.  SLP Short Term Goal 5 (Week 3): Patient will demonstrate emergent awareness by asking for help as  needed during basic functional tasks with min assist question cues.   Weekly Progress Updates: Patient met 1 out of 5 short term objectives this reporting period with functioal gain in oral phase of swallow and carryover with use of compensatory strategies.  While the patient also makes gains in overall cognition her fatigue and attention are limiting factors that may not change and as a result this reporting period will continue to address these deficits with a shift in focus to address family education prior to discharge on 11/8.  SLP Frequency: 1-2 X/day, 30-60 minutes;5 out of 7 days Estimated Length of Stay: anticipated discharge date 11/8 SLP Treatment/Interventions: Cognitive remediation/compensation;Cueing hierarchy;Dysphagia/aspiration precaution training;Environmental controls;Functional tasks;Internal/external aids;Patient/family education;Speech/Language facilitation;Therapeutic Activities;Therapeutic Exercise  Charlane Ferretti., CCC-SLP [phone]  [name] [name] 09/08/2012, 11:56 AM

## 2012-09-08 NOTE — Progress Notes (Signed)
Social Work Patient ID: [name] [name], female   DOB: 16-Jun-1941, 71 y.o.   MRN: [id number] Met with pt and husband here and did attend therapies with pt this afternoon.  Both feel ok with the extended discharge date of Friday. Husband will be here and attend therapies with pt and learn her care.  He feels comfortable with taking her home and providing care to her. He is aware how she flucuates from day to day and is hopeful she will continue to improve with time. Discussed DME and home health follow up therapies. Both are in agreement with the plan now.

## 2012-09-08 NOTE — Progress Notes (Signed)
Nutrition Follow-up  Intervention:  Continue current interventions.  Assessment:   Noted pt had likely vaso vagal episode due to defecation on 11/5. Pt upgraded to dysphagia 3 diet yesterday per SLP recommendations. Pt also on Carbohydrate Modified Medium restrictions.  Intake variable ranging from 25 - 75% of meals. She denies any concerns with her diet, very happy with diet advancement.  Diet Order:  Dysphagia 3 diet with Thin Liquids Supplement: Magic Cup with meals  Meds: Scheduled Meds:    . antiseptic oral rinse  15 mL Mouth Rinse BID  . famotidine  20 mg Oral BID  . insulin aspart  0-15 Units Subcutaneous TID WC  . insulin detemir  16 Units Subcutaneous Q breakfast  . levETIRAcetam  250 mg Oral BID  . metoprolol tartrate  50 mg Oral BID  . multivitamin with minerals  1 tablet Oral Daily  . pantoprazole sodium  40 mg Oral Daily  . senna-docusate  1 tablet Oral Q0600  . vitamin C  500 mg Oral Daily  . [DISCONTINUED] nitrofurantoin (macrocrystal-monohydrate)  100 mg Oral Q12H   Continuous Infusions:    . sodium chloride     PRN Meds:.acetaminophen, alum & mag hydroxide-simeth, bisacodyl, diphenhydrAMINE, guaiFENesin-dextromethorphan, prochlorperazine, prochlorperazine, prochlorperazine, traZODone   CMP     Component Value Date/Time   NA 135 09/07/2012 0856   K 4.2 09/07/2012 0856   CL 92* 09/07/2012 0856   CO2 33* 09/07/2012 0856   GLUCOSE 162* 09/07/2012 0856   BUN 24* 09/07/2012 0856   CREATININE 1.34* 09/07/2012 0856   CREATININE 1.34* 08/16/2012 1614   CALCIUM 9.5 09/07/2012 0856   PROT 6.4 08/30/2012 0845   ALBUMIN 2.3* 08/30/2012 0845   AST 36 08/30/2012 0845   ALT 33 08/30/2012 0845   ALKPHOS 126* 08/30/2012 0845   BILITOT 0.2* 08/30/2012 0845   GFRNONAA 39* 09/07/2012 0856   GFRAA 45* 09/07/2012 0856    CBG (last 3)   Basename 09/08/12 1109 09/08/12 0715 09/07/12 2058  GLUCAP 313* 102* 134*     Intake/Output Summary (Last 24 hours) at 09/08/12  1151 Last data filed at 09/07/12 1800  Gross per 24 hour  Intake    300 ml  Output      0 ml  Net    300 ml  BM 11/5  Weight Status:  105 lb - wt continues to trend down  Estimated needs:  1400 - 1600 kcal, 65 - 75 grams protein  Nutrition Dx:  Inadequate oral intake r/t limited appetite and food preferences AEB poor meal completion.  Goal:  Pt to meet >/= 90% of their estimated nutrition needs  Monitor:  weight trends, lab trends, I/O's, PO intake, supplement tolerance  Jarold Motto MS, RD, LDN Pager: [phone] After-hours pager: [phone]

## 2012-09-08 NOTE — Progress Notes (Signed)
Speech Language Pathology Daily Session Note  Patient Details  Name: [name] [name] MRN: [id number] Date of Birth: [birth year]  Today's Date: 09/08/2012 Time: 1100-1155 Time Calculation (min): 55 min  Short Term Goals: Week 2: SLP Short Term Goal 1 (Week 2): Patient will tolerate Dys. 2 textures and thin liquids with min assist verbal cues for use of compensatory strategies. SLP Short Term Goal 1 - Progress (Week 2): Met SLP Short Term Goal 2 (Week 2): Patient will selectively attend to a basic functional task for approximately 10 minutes with min assist verbal cues for redirection. SLP Short Term Goal 2 - Progress (Week 2): Progressing toward goal SLP Short Term Goal 3 (Week 2): Patient will utilize compensatory strategies to facilitate increased recall of new information with mod-max assist verbal cues. SLP Short Term Goal 3 - Progress (Week 2): Progressing toward goal SLP Short Term Goal 4 (Week 2): Patient will solve basic problems during a familiar task with min assist verbal cues SLP Short Term Goal 4 - Progress (Week 2): Progressing toward goal SLP Short Term Goal 5 (Week 2): Patient will demonstrate emergent awareness by asking for help as needed during basic functional tasks.   SLP Short Term Goal 5 - Progress (Week 2): Progressing toward goal  Skilled Therapeutic Interventions: Treatment addressed dysphagia management and cognition. Patient required set up assistance with lunch tray to compensate for visuospatial deficits Patient consumed Dys. 3 textures and thin liquids with min assist cues to take small bites and multiple swallows per bite/sip and exhibited no overt s/s of aspiration during the session. Patient selectively attended to a basic familiar task for approximately 45 minutes in a moderately distracting environment with supervision cues for redirection. Pt's husband was not present for education during this session.    FIM:  Comprehension Comprehension Mode:  Auditory Comprehension: 5-Understands basic 90% of the time/requires cueing < 10% of the time Expression Expression Mode: Verbal Expression: 5-Expresses basic 90% of the time/requires cueing < 10% of the time. Social Interaction Social Interaction: 5-Interacts appropriately 90% of the time - Needs monitoring or encouragement for participation or interaction. Problem Solving Problem Solving: 4-Solves basic 75 - 89% of the time/requires cueing 10 - 24% of the time Memory Memory: 3-Recognizes or recalls 50 - 74% of the time/requires cueing 25 - 49% of the time FIM - Eating Eating Activity: 5: Supervision/cues  Pain Pain Assessment Pain Assessment: No/denies pain  Therapy/Group: Individual Therapy  Dereck Agerton 09/08/2012, 1:56 PM

## 2012-09-08 NOTE — Consult Note (Signed)
Reason for Consult: Syncope Referring Physician: Dr. Wynn Banker PCP: Dr. Aida Puffer Primary cardiologist: None [name] [name] is an 71 y.o. female.  HPI: This pleasant 71 year old woman was admitted on 08/19/12 with an intracranial bleed.  She presented with confusion to her primary care physician's office and had a subsequent MRI of the brain which showed an area of subacute right intraparenchymal bleed in the parietal area.  She has been undergone rehabilitation in the inpatient rehabilitation unit.  Yesterday morning while trying to have a bowel movement she blacked out.  Her husband was with her at the time.  It was estimated that she was unresponsive for about 2 minutes.  She regained consciousness as she was being transferred to a chair.  Her blood pressure once she was immature was elevated and her heart rate was regular.  EKG taken shortly after her episode showed normal sinus rhythm, biatrial enlargement, and a pattern of incomplete right bundle branch block and there were no ischemic changes.  Vital signs subsequently have been satisfactory with normal blood pressure and normal heart rates recorded.  The patient denies any chest pain or shortness of breath.  The patient does state that she has had previous episodes of near syncope in the remote past when her blood sugars have dropped during the night related to her diabetes.  Past Medical History  Diagnosis Date  . Diabetes mellitus   . GERD (gastroesophageal reflux disease)     No past surgical history on file.  Family History  Problem Relation Age of Onset  . Cancer Mother   . Cancer Father     Social History:  reports that she has never smoked. She does not have any smokeless tobacco history on file. She reports that she does not drink alcohol or use illicit drugs.  Allergies:  Allergies  Allergen Reactions  . Barbiturates     Medications:  Scheduled:   . antiseptic oral rinse  15 mL Mouth Rinse BID  . famotidine  20 mg  Oral BID  . insulin aspart  0-15 Units Subcutaneous TID WC  . insulin detemir  16 Units Subcutaneous Q breakfast  . levETIRAcetam  250 mg Oral BID  . metoprolol tartrate  25 mg Oral BID  . multivitamin with minerals  1 tablet Oral Daily  . pantoprazole sodium  40 mg Oral Daily  . senna-docusate  1 tablet Oral Q0600  . vitamin C  500 mg Oral Daily  . [DISCONTINUED] metoprolol tartrate  50 mg Oral BID  . [DISCONTINUED] nitrofurantoin (macrocrystal-monohydrate)  100 mg Oral Q12H    Results for orders placed during the hospital encounter of 08/27/12 (from the past 48 hour(s))  GLUCOSE, CAPILLARY     Status: Normal   Collection Time   09/06/12  9:16 PM      Component Value Range Comment   Glucose-Capillary 97  70 - 99 mg/dL   GLUCOSE, CAPILLARY     Status: Abnormal   Collection Time   09/07/12  7:10 AM      Component Value Range Comment   Glucose-Capillary 132 (*) 70 - 99 mg/dL    Comment 1 Notify RN     GLUCOSE, CAPILLARY     Status: Abnormal   Collection Time   09/07/12  8:28 AM      Component Value Range Comment   Glucose-Capillary 157 (*) 70 - 99 mg/dL   BASIC METABOLIC PANEL     Status: Abnormal   Collection Time   09/07/12  8:56 AM  Component Value Range Comment   Sodium 135  135 - 145 mEq/L    Potassium 4.2  3.5 - 5.1 mEq/L    Chloride 92 (*) 96 - 112 mEq/L    CO2 33 (*) 19 - 32 mEq/L    Glucose, Bld 162 (*) 70 - 99 mg/dL    BUN 24 (*) 6 - 23 mg/dL    Creatinine, Ser 2.13 (*) 0.50 - 1.10 mg/dL    Calcium 9.5  8.4 - 08.6 mg/dL    GFR calc non Af Amer 39 (*) >90 mL/min    GFR calc Af Amer 45 (*) >90 mL/min   GLUCOSE, CAPILLARY     Status: Abnormal   Collection Time   09/07/12 11:28 AM      Component Value Range Comment   Glucose-Capillary 289 (*) 70 - 99 mg/dL   GLUCOSE, CAPILLARY     Status: Abnormal   Collection Time   09/07/12  4:39 PM      Component Value Range Comment   Glucose-Capillary 194 (*) 70 - 99 mg/dL    Comment 1 Notify RN     GLUCOSE, CAPILLARY      Status: Abnormal   Collection Time   09/07/12  8:58 PM      Component Value Range Comment   Glucose-Capillary 134 (*) 70 - 99 mg/dL    Comment 1 Notify RN     GLUCOSE, CAPILLARY     Status: Abnormal   Collection Time   09/08/12  7:15 AM      Component Value Range Comment   Glucose-Capillary 102 (*) 70 - 99 mg/dL    Comment 1 Notify RN     GLUCOSE, CAPILLARY     Status: Abnormal   Collection Time   09/08/12 11:09 AM      Component Value Range Comment   Glucose-Capillary 313 (*) 70 - 99 mg/dL    Comment 1 Notify RN     GLUCOSE, CAPILLARY     Status: Normal   Collection Time   09/08/12  4:37 PM      Component Value Range Comment   Glucose-Capillary 72  70 - 99 mg/dL    Comment 1 Notify RN       No results found.  Review of systems negative except as noted above Blood pressure 111/65, pulse 86, temperature 98.4 F (36.9 C), temperature source Oral, resp. rate 16, weight 105 lb 9.6 oz (47.9 kg), SpO2 96.00%. The general appearance reveals a thin elderly woman who appears to be older than her stated age.Pupils equal and reactive.   Extraocular Movements are full.  There is no scleral icterus.  The mouth and pharynx are normal.  The neck is supple.  The carotids reveal no bruits.  The jugular venous pressure is normal.  The thyroid is not enlarged.  There is no lymphadenopathy.  The chest is clear to percussion and auscultation. There are no rales or rhonchi. Expansion of the chest is symmetrical.  The precordium is quiet.  The first heart sound is normal.  The second heart sound is physiologically split.  There is no murmur gallop rub or click.  There is no abnormal lift or heave.  The abdomen is soft and nontender. Bowel sounds are normal. The liver and spleen are not enlarged. There Are no abdominal masses. There are no bruits.  Extremities show good peripheral pulses and no phlebitis or edema.  Echocardiogram performed on 08/24/12 was a technically difficult study but did show  normal  left ventricular systolic function.   Assessment/Plan: 1.  Syncope most likely secondary to vasovagal reaction while straining at stool.      Of note is the fact that her BUN and creatinine have been slightly elevated suggesting that she may have some mild intravascular depletion.  She appears to have benefited from the liter of saline given to her after yesterday's episode.  Would recommend continuing to encourage adequate oral intake of fluids.  She is not on a diuretic.  No further inpatient cardiac workup needed at this point.  I note that she anticipates being discharged from the hospital on Friday.  She should be on a good bowel program so that she does not have to strain at stool.  If she has further episodes on an outpatient basis, an outpatient event monitor could be considered.  Cassell Clement 09/08/2012, 5:27 PM

## 2012-09-08 NOTE — Progress Notes (Signed)
Social Work Patient ID: [name] [name], female   DOB: 02/05/1941, 71 y.o.   MRN: [id number] Pt requires a lightweight wheelchair to self propel and use for self care tasks.  She is unable to self propel a standard wheelchair.

## 2012-09-08 NOTE — Progress Notes (Signed)
Occupational Therapy Session Note  Patient Details  Name: [name] [name] MRN: [id number] Date of Birth: [birth year]  Today's Date: 09/08/2012 Time: 4098-1191 and 1300-1330 Time Calculation (min): 57 min and 30 min  Short Term Goals: Week 2:  OT Short Term Goal 1 (Week 2): STG = LTGs due to remaining LOS  Skilled Therapeutic Interventions/Progress Updates:    1) Pt seen for ADL retraining with focus on functional mobility with and without RW, transfers to/from toilet and tub, and self-care tasks of bathing, dressing, and grooming.  Pt ambulated to toilet with min assist at hips without RW, secondary to tendency to neglect RW and it resulting in tripping her up.  Engaged in bathing and dressing at sink per pt's request with focus on sit <> stand, dynamic standing balance, and safety with donning pants in sitting prior to standing to pull them up.  Pt continues to demonstrate visual deficits, specifically depth perception, with obtaining items and putting toothpaste on toothbrush.  Engaged in tub transfer with pt using grab bars and min assist to step into shower and lower self into tub.  Educated pt on various techniques to get up out of tub, with pt responding most to using grab bars to pull self up to sit on tub ledge and then pivot and bring legs out prior to standing.  Educated pt on if she continues to prefer to bathe in tub, she will need to have her husband assist with getting in and out of tub secondary to weakness.  Completed 3 reps of 15 bicep curls and chest presses with 1# and 2# dowel weights.  2) Pt seen for hands on family education with husband.  Conducted tub and toilet transfers with min assist.  PTA pt would bathe down in bathtub and would occasionally require some assistance getting out.  Engaged in tub transfer with use of 2 grab bars, using front grab bar for steadying when stepping over ledge followed by front and side grab bar to assist in lowering down to sitting in tub.  Husband  provided light min/steadying assist at hips when stepping in to tub, and more physical assistance when getting out of tub.  Pt demonstrated recall of pushing up to sitting on tub ledge and pivoting legs out to floor and pushing up to standing, husband provided lifting assistance with pushing up to tub ledge.  Pt's husband reports feeling comfortable providing this level of assistance and reports understanding that pt's abilities fluctuate and the treatment team is recommending 24/7 supervision.  Discussed tub transfer equipment with pt and husband reporting not wanting any tub equipment.  Engaged in toilet transfer with min assist and discussed use of BSC for night time use to increase safety and decrease accidents from urgency.  Therapy Documentation Precautions:  Precautions Precautions: Fall Restrictions Weight Bearing Restrictions: No General:   Vital Signs: Therapy Vitals Pulse Rate: 86  BP: 121/72 mmHg Patient Position, if appropriate: Standing Pain:  Pt with no c/o pain this session.  See FIM for current functional status  Therapy/Group: Individual Therapy  Leonette Monarch 09/08/2012, 10:49 AM

## 2012-09-08 NOTE — Patient Care Conference (Signed)
Inpatient RehabilitationTeam Conference Note Date: 09/08/2012   Time: 10:30 am    Patient Name: [name] [name]      Medical Record Number: [id number]  Date of Birth: [birth year] Sex: Female         Room/Bed: 4005/4005-01 Payor Info: Payor: MEDICARE  Plan: MEDICARE PART A  Product Type: *No Product type*     Admitting Diagnosis: TEMPORAL OCCIPAL HEMORRHAGE  Admit Date/Time:  08/27/2012  4:15 PM Admission Comments: No comment available   Primary Diagnosis:  Hemorrhage in the brain Principal Problem: Hemorrhage in the brain  Patient Active Problem List   Diagnosis Date Noted  . Syncope 09/08/2012  . Acute respiratory failure with hypoxia 08/20/2012  . Seizure disorder, grand mal 08/20/2012  . Seizure 08/20/2012  . CKD (chronic kidney disease) stage 3, GFR 30-59 ml/min 08/19/2012  . Leukocytosis 08/19/2012  . Hemorrhage in the brain-13 x 22 x 14 mm right posterior temporo-occipital intra-axial acute 08/19/2012  . Left hip pain 08/19/2012  . Left hemiparesis 08/19/2012  . Hemianopia, homonymous, left 08/19/2012  . Hemisensory deficit, left 08/19/2012  . Nausea 08/19/2012  . Headache 08/19/2012  . Encephalopathy acute 08/19/2012  . Hemorrhage of brain, nontraumatic 08/18/2012  . Dysphagia 08/18/2012  . HTN (hypertension) 08/18/2012  . DM type 2 (diabetes mellitus, type 2) 07/26/2012  . Hypoglycemia due to insulin 07/26/2012  . GERD (gastroesophageal reflux disease)     Expected Discharge Date: Expected Discharge Date: 09/10/12  Team Members Present: Physician: Dr. Claudette Laws Social Worker Present: Dossie Der, LCSW Nurse Present: Other (comment) Morrie Sheldon Royal-RN) PT Present: Edman Circle, Lillie Columbia, PT OT Present: Leonette Monarch, OT SLP Present: Fae Pippin, SLP Other (Discipline and Name): Charolette Child Coordinator  & Carolynn Serve RN     Current Status/Progress Goal Weekly Team Focus  Medical   Left lower lobe pneumonia improving, fluctuating mental  status, Dehydration  Improve hydration status  IV fluids encourage by mouth   Bowel/Bladder   Continent of bowel and bladder. LBM 09/07/12  Pt to remain continent of bowel and bladder  Monitor   Swallow/Nutrition/ Hydration   Dys.2 textures and thin liquids with min-mod assist to  utilize 2-3 swallows per bite/sip   least restrictive p.o. intake   carryover use of compensatory strategies and initiate pharyngeal strengthening and deep breathing exercises, trials of Dys. 3 textures    ADL's   min/steady assist with bathing and dressing, min assist transfers, fluctuating Lt inattention  supervision overall  Lt attention, dynamic standing balance, transfers, activity tolerance/endurance   Mobility   min-max A when fatigued  supervision overall; will likely downgrade goals due to lack of progress  balance, gait, sustained attention, activity tolerance, vision   Communication             Safety/Cognition/ Behavioral Observations  mod assist   min assist   increase carryover and use of compensatory strategies    Pain   Tylenol 650mg  q 4hrs prn for c/o neck and head discomfort  <3  Monitor for effectiveness   Skin   CDI  No skin breakdown  Routine turn q 2hrs      *See Interdisciplinary Assessment and Plan and progress notes for long and short-term goals  Barriers to Discharge: Husband needs training    Possible Resolutions to Barriers:  Provide caregiver education    Discharge Planning/Teaching Needs:  Home with husband, husband participating in wife's care and preparing for discharge tomorrow.      Team Discussion:  Need for family education  due to pt flucuation, see if husband comfortable with pt's care.  Revise goals to min level  Revisions to Treatment Plan:  Extended discharge date to Friday   Continued Need for Acute Rehabilitation Level of Care: The patient requires daily medical management by a physician with specialized training in physical medicine and rehabilitation for  the following conditions: Daily direction of a multidisciplinary physical rehabilitation program to ensure safe treatment while eliciting the highest outcome that is of practical value to the patient.: Yes Daily medical management of patient stability for increased activity during participation in an intensive rehabilitation regime.: Yes Daily analysis of laboratory values and/or radiology reports with any subsequent need for medication adjustment of medical intervention for : Neurological problems;Other  Lucy Chris 09/10/2012, 8:55 AM

## 2012-09-08 NOTE — Progress Notes (Signed)
Patient ID: [name] [name], female   DOB: 1941/04/09, 71 y.o.   MRN: [id number] Subjective/Complaints: [name] is a 71 y.o. female with history of DM, fall 10 days PTA on 08/18/12 with worsening of HA and 3 day history of running into things on the left with gait problems. Was taking ASA q 6 hours for headaches. MRI head done on outpatient basis with right posterior temporo-occipital hemorrhage and patient admitted for workup. Patient developed seizure on 10/17 pm and was loaded with dilantin and started on Keppra. EEG done revealing moderately severe, continuous nonspecific slowing of cerebral activity with question of encephalopathy v/s post ictal state. She did develop fever with hypotension and patient intubated on 10/18. CCM consulted for input and patient started on IV zosyn briefly for presumed aspiration PNA.   Awakens easily  THinks she is in different room.  Where is my husband? No CP no dizziness Review of Systems  Respiratory: Negative for shortness of breath.   Neurological: Positive for weakness. Negative for seizures.  All other systems reviewed and are negative.       Objective: Vital Signs: Blood pressure 109/65, pulse 77, temperature 98.4 F (36.9 C), temperature source Oral, resp. rate 17, weight 47.9 kg (105 lb 9.6 oz), SpO2 96.00%. Gen: NAD Mood/affect slow to respond no agitation Lungs:  Clear Cor:  RRR Abd:  +BS soft NT Ext -C/C/E Motor 4/5 in BUE and BLE Orientation X 3, recognizes me as "Dr."        Results for orders placed during the hospital encounter of 08/27/12 (from the past 72 hour(s))  GLUCOSE, CAPILLARY     Status: Abnormal   Collection Time   09/05/12  7:58 AM      Component Value Range Comment   Glucose-Capillary 218 (*) 70 - 99 mg/dL    Comment 1 Notify RN     GLUCOSE, CAPILLARY     Status: Abnormal   Collection Time   09/05/12 12:05 PM      Component Value Range Comment   Glucose-Capillary 166 (*) 70 - 99 mg/dL    Comment 1 Notify RN       GLUCOSE, CAPILLARY     Status: Abnormal   Collection Time   09/05/12  4:00 PM      Component Value Range Comment   Glucose-Capillary 119 (*) 70 - 99 mg/dL    Comment 1 Notify RN     GLUCOSE, CAPILLARY     Status: Abnormal   Collection Time   09/05/12  9:26 PM      Component Value Range Comment   Glucose-Capillary 123 (*) 70 - 99 mg/dL    Comment 1 Notify RN     GLUCOSE, CAPILLARY     Status: Abnormal   Collection Time   09/06/12  7:10 AM      Component Value Range Comment   Glucose-Capillary 131 (*) 70 - 99 mg/dL    Comment 1 Notify RN     GLUCOSE, CAPILLARY     Status: Abnormal   Collection Time   09/06/12 12:00 PM      Component Value Range Comment   Glucose-Capillary 191 (*) 70 - 99 mg/dL    Comment 1 Notify RN     GLUCOSE, CAPILLARY     Status: Normal   Collection Time   09/06/12  4:23 PM      Component Value Range Comment   Glucose-Capillary 79  70 - 99 mg/dL   GLUCOSE, CAPILLARY     Status: Normal  Collection Time   09/06/12  9:16 PM      Component Value Range Comment   Glucose-Capillary 97  70 - 99 mg/dL   GLUCOSE, CAPILLARY     Status: Abnormal   Collection Time   09/07/12  7:10 AM      Component Value Range Comment   Glucose-Capillary 132 (*) 70 - 99 mg/dL    Comment 1 Notify RN     GLUCOSE, CAPILLARY     Status: Abnormal   Collection Time   09/07/12  8:28 AM      Component Value Range Comment   Glucose-Capillary 157 (*) 70 - 99 mg/dL   BASIC METABOLIC PANEL     Status: Abnormal   Collection Time   09/07/12  8:56 AM      Component Value Range Comment   Sodium 135  135 - 145 mEq/L    Potassium 4.2  3.5 - 5.1 mEq/L    Chloride 92 (*) 96 - 112 mEq/L    CO2 33 (*) 19 - 32 mEq/L    Glucose, Bld 162 (*) 70 - 99 mg/dL    BUN 24 (*) 6 - 23 mg/dL    Creatinine, Ser 4.09 (*) 0.50 - 1.10 mg/dL    Calcium 9.5  8.4 - 81.1 mg/dL    GFR calc non Af Amer 39 (*) >90 mL/min    GFR calc Af Amer 45 (*) >90 mL/min   GLUCOSE, CAPILLARY     Status: Abnormal   Collection Time    09/07/12 11:28 AM      Component Value Range Comment   Glucose-Capillary 289 (*) 70 - 99 mg/dL   GLUCOSE, CAPILLARY     Status: Abnormal   Collection Time   09/07/12  4:39 PM      Component Value Range Comment   Glucose-Capillary 194 (*) 70 - 99 mg/dL    Comment 1 Notify RN     GLUCOSE, CAPILLARY     Status: Abnormal   Collection Time   09/07/12  8:58 PM      Component Value Range Comment   Glucose-Capillary 134 (*) 70 - 99 mg/dL    Comment 1 Notify RN        HEENT: normal Cardio: RRR Resp: CTA B/L GI: BS positive Extremity:  Pulses positive Skin:   Intact Neuro: Awake but less alert, Cranial Nerve II-XII normal, Abnormal Sensory reduced on L , Normal Motor, Abnormal FMC Ataxic/ dec FMC, Inattention on L visual and tactile, L HH, apraxia Musc/Skel:  Normal Oriented to person place and situation. Converses nicely. Very alert. laughing   Assessment/Plan: 1. Functional deficits secondary to R temporo-occipital ICH which require 3+ hours per day of interdisciplinary therapy in a comprehensive inpatient rehab setting. Physiatrist is providing close team supervision and 24 hour management of active medical problems listed below. Physiatrist and rehab team continue to assess barriers to discharge/monitor patient progress toward functional and medical goals. FIM: FIM - Bathing Bathing Steps Patient Completed: Chest;Right Arm;Left Arm;Abdomen;Front perineal area;Buttocks;Right upper leg;Left upper leg;Right lower leg (including foot);Left lower leg (including foot) Bathing: 4: Steadying assist  FIM - Upper Body Dressing/Undressing Upper body dressing/undressing steps patient completed: Thread/unthread right sleeve of pullover shirt/dresss;Thread/unthread left sleeve of pullover shirt/dress;Put head through opening of pull over shirt/dress;Pull shirt over trunk Upper body dressing/undressing: 5: Set-up assist to: Obtain clothing/put away FIM - Lower Body Dressing/Undressing Lower body  dressing/undressing steps patient completed: Thread/unthread right underwear leg;Thread/unthread left underwear leg;Pull underwear up/down;Thread/unthread right pants leg;Thread/unthread  left pants leg;Pull pants up/down;Don/Doff right sock;Don/Doff left sock;Don/Doff right shoe;Don/Doff left shoe;Fasten/unfasten right shoe;Fasten/unfasten left shoe Lower body dressing/undressing: 5: Set-up assist to: Obtain clothing  FIM - Toileting Toileting steps completed by patient: Adjust clothing prior to toileting;Performs perineal hygiene;Adjust clothing after toileting Toileting Assistive Devices: Grab bar or rail for support Toileting: 4: Steadying assist  FIM - Diplomatic Services operational officer Devices: Best boy Transfers: 4-To toilet/BSC: Min A (steadying Pt. > 75%);4-From toilet/BSC: Min A (steadying Pt. > 75%)  FIM - Banker Devices: Walker;Bed rails Bed/Chair Transfer: 4: Bed > Chair or W/C: Min A (steadying Pt. > 75%);4: Chair or W/C > Bed: Min A (steadying Pt. > 75%)  FIM - Locomotion: Wheelchair Distance: 150 Locomotion: Wheelchair: 1: Total Assistance/staff pushes wheelchair (Pt<25%) FIM - Locomotion: Ambulation Locomotion: Ambulation Assistive Devices: Designer, industrial/product Ambulation/Gait Assistance: 3: Mod assist (HHA) Locomotion: Ambulation: 1: Travels less than 50 ft with moderate assistance (Pt: 50 - 74%)  Comprehension Comprehension Mode: Auditory Comprehension: 5-Understands basic 90% of the time/requires cueing < 10% of the time  Expression Expression Mode: Verbal Expression: 4-Expresses basic 75 - 89% of the time/requires cueing 10 - 24% of the time. Needs helper to occlude trach/needs to repeat words.  Social Interaction Social Interaction: 5-Interacts appropriately 90% of the time - Needs monitoring or encouragement for participation or interaction.  Problem Solving Problem Solving: 4-Solves basic 75 - 89%  of the time/requires cueing 10 - 24% of the time  Memory Memory: 3-Recognizes or recalls 50 - 74% of the time/requires cueing 25 - 49% of the time   Medical Problem List and Plan:  1. DVT Prophylaxis/Anticoagulation: Mechanical: Sequential compression devices, below knee Bilateral lower extremities  2. Pain Management: N/A  3. Mood: difficult to judge at this time due to impaired cognition and poor awareness.  4. Neuropsych: This patient is not capable of making decisions on his/her own behalf.  5. DM type 2 insulin dependent : BS have been poorly controlled. Continue levimir with meal coverage and SSI. Monitor CBG AC/HS checks. Hypoglycemia likely due to novalog.  Glucophage XR just started 6. Orthostasis: will check ortho static BP in am and set parameters for BP meds. Also with vasovagal episode, EKG ok, hydrated with 1 liter IVF 7. New onset seizures: Continue keppra 250 mg bid. Monitor for recurrence. No signs thus far 8. Resting tachycardia: likely due to deconditioning. Will monitor with bid checks and patient symptoms.  9. Hypokalemia: resolved.  10. ABLA: continue to monitor for trends.  .  11.leucocytosis  No fever 12. Dysphagia: Will nocturnal IVF.D2 thin 13.  Pneumonia vs pleural effusion, lungs sound f/u Xray showed no infiltrates or effusion.  Afebrile off Avelox 13.  GERD, on protonix, troponins neg 14. Lethargy intermittent Parieto occipital infarct  may wax and wane related to sleep, fatigue from activity, etc  LOS (Days) 12 A FACE TO FACE EVALUATION WAS PERFORMED  Cheyrl Buley E 09/08/2012, 6:53 AM

## 2012-09-09 ENCOUNTER — Inpatient Hospital Stay (HOSPITAL_COMMUNITY): Payer: Medicare Other | Admitting: *Deleted

## 2012-09-09 ENCOUNTER — Inpatient Hospital Stay (HOSPITAL_COMMUNITY): Payer: Medicare Other | Admitting: Speech Pathology

## 2012-09-09 ENCOUNTER — Inpatient Hospital Stay (HOSPITAL_COMMUNITY): Payer: Medicare Other | Admitting: Occupational Therapy

## 2012-09-09 DIAGNOSIS — I619 Nontraumatic intracerebral hemorrhage, unspecified: Secondary | ICD-10-CM

## 2012-09-09 DIAGNOSIS — R569 Unspecified convulsions: Secondary | ICD-10-CM

## 2012-09-09 DIAGNOSIS — I69991 Dysphagia following unspecified cerebrovascular disease: Secondary | ICD-10-CM

## 2012-09-09 LAB — GLUCOSE, CAPILLARY
Glucose-Capillary: 139 mg/dL — ABNORMAL HIGH (ref 70–99)
Glucose-Capillary: 271 mg/dL — ABNORMAL HIGH (ref 70–99)
Glucose-Capillary: 134 mg/dL — ABNORMAL HIGH (ref 70–99)

## 2012-09-09 NOTE — Plan of Care (Signed)
Problem: RH Toilet Transfers Goal: LTG Patient will perform toilet transfers w/assist (OT) LTG: Patient will perform toilet transfers with assist, with/without cues using equipment (OT)  Outcome: Adequate for Discharge Pt is supervision with toilet transfers with RW, min assist without.  Pt's husband is able to provide assist with arm around back and prefers this method for ambulating in household environment.

## 2012-09-09 NOTE — Progress Notes (Signed)
Occupational Therapy Discharge Summary  Patient Details  Name: [name] [name] MRN: [id number] Date of Birth: August 15, 1941  Today's Date: 09/09/2012  Patient has met 10 of 10 long term goals due to improved activity tolerance, improved balance, postural control and ability to compensate for deficits.  Patient to discharge at overall Supervision level.  Patient's care partner is independent to provide the necessary physical and cognitive assistance at discharge.    Reasons goals not met: Pt is supervision with toilet transfers with RW, min assist without assistive device. Pt's husband is able to provide assist with arm around waist and prefers this method for ambulating in household environment.  Recommendation:  Patient will benefit from ongoing skilled OT services in home health setting to continue to advance functional skills in the area of vision, BADL and Reduce care partner burden.  Equipment: RW and 3 in 1 commode  Reasons for discharge: treatment goals met and discharge from hospital  Patient/family agrees with progress made and goals achieved: Yes  OT Discharge Precautions/Restrictions  Precautions Precautions: Fall Pain Pain Assessment Pain Assessment: No/denies pain ADL ADL Eating: Set up Where Assessed-Eating: Wheelchair Grooming: Supervision/safety;Minimal cueing Where Assessed-Grooming: Standing at sink Upper Body Bathing: Supervision/safety Where Assessed-Upper Body Bathing: Sitting at sink;Shower Lower Body Bathing: Supervision/safety Where Assessed-Lower Body Bathing: Sitting at sink;Standing at sink;Shower Upper Body Dressing: Supervision/safety;Setup Where Assessed-Upper Body Dressing: Sitting at sink Lower Body Dressing: Supervision/safety;Setup Where Assessed-Lower Body Dressing: Sitting at sink;Standing at sink Toileting: Supervision/safety Where Assessed-Toileting: Teacher, adult education: Close supervision;Minimal assistance (supervision with RW, min assist  HHA or arm around waist) Toilet Transfer Method: Proofreader: Grab bars Tub/Shower Transfer: Minimal assistance Tub/Shower Transfer Method: Ship broker: Grab bars ADL Comments: Pt with severe visual disturbances (Lt inattention/neglect, depth perception deficits) which are negatively effecting her ability to complete self-care tasks safely.  These deficits have improved somewhat, but tend to fluctuate based on attention and cognition. Vision/Perception  Vision - History Baseline Vision: Wears glasses all the time Patient Visual Report: Undershooting;Unable to keep objects in focus Vision - Assessment Eye Alignment: Within Functional Limits Additional Comments: Pt continues to report poor vision, and has some noted depth perception difficulties, however this has improved slightly and tends to be quite inconsistent.  Cognition   Impaired Sensation Sensation Light Touch: Appears Intact Coordination Gross Motor Movements are Fluid and Coordinated: No Fine Motor Movements are Fluid and Coordinated: No Finger Nose Finger Test: deficits secondary to visual impairments  Extremity/Trunk Assessment RUE Assessment RUE Assessment: Within Functional Limits LUE Assessment LUE Assessment: Within Functional Limits  See FIM for current functional status  Leonette Monarch 09/09/2012, 4:09 PM

## 2012-09-09 NOTE — Progress Notes (Signed)
Occupational Therapy Session Note  Patient Details  Name: [name] [name] MRN: [id number] Date of Birth: 1941-05-14  Today's Date: 09/09/2012 Time: 1000-1057 and 4098-1191 Time Calculation (min): 57 min and 28 min  Short Term Goals: Week 2:  OT Short Term Goal 1 (Week 2): STG = LTGs due to remaining LOS  Skilled Therapeutic Interventions/Progress Updates:    1) Pt seen for ADL retraining with focus on hands on family education with husband.  Husband ambulated with pt with arm around waist, providing min assist, to toilet without use of AD.  Pt and husband safe with ambulation and transfers.  Pt's husband reports that he is ready to provide assistance as needed and plans to assist with her ambulation.  Pt completed bathing and dressing at sink side with supervision/setup and min cues to locate and orient items.  Ambulated with RW with close supervision to ADL apartment, completed toilet transfer with supervision with RW and tub transfer with min assist.  Engaged in BUE strengthening with 2# dowel rod and theraband per pt's request to assist with UB strength with getting out of tub.  Pt's husband reports no further questions regarding family training and d/c preparation.  2) 1:1 OT with focus on safety with RW in home environment.  Pt supervision with straightaways, however requires min assist with RW when turning or when distracted.  Pt easily distracted by others around her which increases her risk of LOB.  Engaged in bed mobility in ADL apartment and retrieving items from refrigerator.  Pt with perceptual difficulty with righting RW in front of her when getting OOB.  Verbal cues provided to attend to RW and keep it in front of her with turns and negotiating obstacles.  Discussed grab bars with pt's husband to assist with tub transfer, he plans to purchase grab bars from medical supply store.  Therapy Documentation Precautions:  Precautions Precautions: Fall Restrictions Weight Bearing Restrictions:  No Pain:  Pt with no c/o pain this session.  See FIM for current functional status  Therapy/Group: Individual Therapy  Leonette Monarch 09/09/2012, 12:10 PM

## 2012-09-09 NOTE — Progress Notes (Signed)
The skilled treatment note has been reviewed and SLP is in agreement. Lakoda Raske, M.A., CCC-SLP [phone]  

## 2012-09-09 NOTE — Progress Notes (Signed)
Speech Language Pathology Daily Session Note  Patient Details  Name: [name] [name] MRN: [id number] Date of Birth: [birth year]  Today's Date: 09/09/2012 Time: 7829-5621 Time Calculation (min): 45 min  Short Term Goals: Week 3: SLP Short Term Goal 1 (Week 3): Patient will tolerate Dys. 3 textures and thin liquids with min assist verbal cues for use of compensatory strategies.  SLP Short Term Goal 2 (Week 3): Patient will selectively attend to a basic functional task for approximately 10 minutes with min assist verbal cues for redirection.  SLP Short Term Goal 3 (Week 3): Patient will utilize compensatory strategies to facilitate increased recall of new information with mod-max assist verbal cues. SLP Short Term Goal 4 (Week 3): Patient will solve basic problems during a familiar task with min assist verbal cues.  SLP Short Term Goal 5 (Week 3): Patient will demonstrate emergent awareness by asking for help as needed during basic functional tasks with min assist question cues.   Skilled Therapeutic Interventions: Treatment addressed dysphagia management and cognition. Patient required set up assistance with lunch tray to compensate for visuospatial deficits.  Patient consumed Dys. 3 textures and thin liquids with supervision cues to take small bites and multiple swallows per bite/sip and exhibited no overt s/s of aspiration during the session. Patient also demonstrated improved self-monitoring and self-correcting for utilization of compensatory strategies with min assist faded to supervision level verbal cues, which is indicative of an improvement in emergent awareness.  Patient alternated attention between clinician and a basic familiar task for approximately 45 minutes in a moderately distracting environment with supervision cues for redirection. Pt's husband was not present for education during this session.    FIM:  Comprehension Comprehension Mode: Auditory Comprehension: 5-Understands basic 90%  of the time/requires cueing < 10% of the time Expression Expression Mode: Verbal Expression: 5-Expresses basic 90% of the time/requires cueing < 10% of the time. Social Interaction Social Interaction: 5-Interacts appropriately 90% of the time - Needs monitoring or encouragement for participation or interaction. Problem Solving Problem Solving: 4-Solves basic 75 - 89% of the time/requires cueing 10 - 24% of the time Memory Memory: 3-Recognizes or recalls 50 - 74% of the time/requires cueing 25 - 49% of the time FIM - Eating Eating Activity: 5: Supervision/cues;5: Set-up assist for open containers  Pain Pain Assessment Pain Assessment: No/denies pain  Therapy/Group: Individual Therapy  Jackalyn Lombard, Conrad [place]  Graduate Clinician Speech Language Pathology   Josselyn Harkins, Joni Reining 09/09/2012, 4:20 PM

## 2012-09-09 NOTE — Progress Notes (Addendum)
Physical Therapy Session Note  Patient Details  Name: [name] [name] MRN: [id number] Date of Birth: [birth year]  Today's Date: 09/09/2012 Time: 6295-2841 Time Calculation (min): 60 min   Skilled Therapeutic Interventions/Progress Updates:    Tx focused on continued family training, floor transfer gait training in home environment, and functional balance tasks to reduce falls risk. Spouse present and both were educated on importance of safely increasing activity level at home to reduce risk of CVA. They report having no unresolved questions from family training or therapy at this time.   Pt stood x5 min unsupported while taking meds for static balance and activity tolerance training with close SBA.  Pt performed ambulation in room with RW and close S to retrieve shoes and returned retro walking sit for donning. Pt needed cues to complete tying both shoes.   Sit<>stand transfers and stand-step transfers 50% S/ 50% Min-guard for steadying with unfollowed cues for hand placement.   Gait training 2x150' with no device and spouse with arm around waist, providing Min A for steadying in controlled environment. Gait training on high carpet with same arrangement x20' with Min A and x40' with RW and close SBA, but not LOB. Pt needed cues for adjusting RW management for threshold of carpet. Performed obstacle course x30' on carpet with RW and occasional Min A for steadying with tight turns.   Instructed pt/spouse in floor transfer with S only needed and cues for optimal LE sequence, but pt able to perform with either LE performing lifting.   Curb step transfer to simulate home entrance (modified curb for 8" height) with and without RW x 6 with Min A for steadying and cues for sequence and technique for RW management. Spouse able to return demonstrate with cues for positioning himself for optimal safety.   Dynamic standing balance with ball toss and up to Min-A to recover LOB to L. Also performed ball toss  with 1 foot up on 4" step x10 each LE with up to Mod A to recover LOB x1 in challenging condition.   Functional balance task during gait return to room with spouse assist , scanning environment to find, reach, and carry cones from various heights, including floor. Pt needing Min A for balance and cues for 50% of cones on L/R side.   Sit>supine with S only as pt partially sat on rail with cues to scoot.     Therapy Documentation Precautions:  Precautions Precautions: Fall Restrictions Weight Bearing Restrictions: No Pain: None    See FIM for current functional status  Therapy/Group: Individual Therapy  Virl Cagey, PT 09/09/2012, 9:41 AM

## 2012-09-09 NOTE — Progress Notes (Signed)
   Subjective:  Feels well.  No further dizzy spells or syncope.  Gentle IV hydration in progress.  Objective:  Vital Signs in the last 24 hours: Temp:  [98.3 F (36.8 C)-98.4 F (36.9 C)] 98.3 F (36.8 C) (11/07 1914) Pulse Rate:  [71-89] 71  (11/07 0633) Resp:  [16-18] 18  (11/07 7829) BP: (108-121)/(60-84) 112/71 mmHg (11/07 0634) SpO2:  [96 %-97 %] 97 % (11/07 5621) Weight:  [112 lb 3.4 oz (50.9 kg)] 112 lb 3.4 oz (50.9 kg) (11/07 0500)  Intake/Output from previous day: 11/06 0701 - 11/07 0700 In: 960 [P.O.:960] Out: -  Intake/Output from this shift:       . antiseptic oral rinse  15 mL Mouth Rinse BID  . famotidine  20 mg Oral BID  . insulin aspart  0-15 Units Subcutaneous TID WC  . insulin detemir  16 Units Subcutaneous Q breakfast  . levETIRAcetam  250 mg Oral BID  . metoprolol tartrate  25 mg Oral BID  . multivitamin with minerals  1 tablet Oral Daily  . pantoprazole sodium  40 mg Oral Daily  . senna-docusate  1 tablet Oral Q0600  . vitamin C  500 mg Oral Daily  . [DISCONTINUED] metoprolol tartrate  50 mg Oral BID      . sodium chloride 75 mL/hr (09/08/12 1822)    Physical Exam: The general appearance reveals a thin elderly woman who appears to be older than her stated age.Pupils equal and reactive. Extraocular Movements are full. There is no scleral icterus. The mouth and pharynx are normal. The neck is supple. The carotids reveal no bruits. The jugular venous pressure is normal. The thyroid is not enlarged. There is no lymphadenopathy.  The chest is clear to percussion and auscultation. There are no rales or rhonchi. Expansion of the chest is symmetrical.  The precordium is quiet. The first heart sound is normal. The second heart sound is physiologically split. There is a soft musical systolic murmur along LLSE. There is no abnormal lift or heave.  The abdomen is soft and nontender. Bowel sounds are normal. The liver and spleen are not enlarged. There Are no  abdominal masses. There are no bruits.  Extremities show good peripheral pulses and no phlebitis or edema.   Lab Results: No results found for this basename: WBC:2,HGB:2,PLT:2 in the last 72 hours  Basename 09/07/12 0856  NA 135  K 4.2  CL 92*  CO2 33*  GLUCOSE 162*  BUN 24*  CREATININE 1.34*   No results found for this basename: TROPONINI:2,CK,MB:2 in the last 72 hours Hepatic Function Panel No results found for this basename: PROT,ALBUMIN,AST,ALT,ALKPHOS,BILITOT,BILIDIR,IBILI in the last 72 hours No results found for this basename: CHOL in the last 72 hours No results found for this basename: PROTIME in the last 72 hours  Imaging: Imaging results have been reviewed  Cardiac Studies: No new studies. Assessment/Plan:  Patient Active Hospital Problem List: * Syncope (09/08/2012)   Assessment: Likely vasovagal in presence of mildly dehydrated state.   Plan: Adequate hydration.  Avoid straining at stool.  No further cardiac studies planned for hospital. Will see again prn.   LOS: 13 days    Cassell Clement 09/09/2012, 7:04 AM

## 2012-09-09 NOTE — Progress Notes (Signed)
Social Work Patient ID: [name] [name], female   DOB: 06/14/1941, 71 y.o.   MRN: [id number] Met with husband who was wondering about insulin pens and coverage.  Insurance covers and would be easier for husband to see the numbers on the pen. Pam-PA aware and will write order for these at discharge.  Ready for discharge tomorrow.

## 2012-09-09 NOTE — Progress Notes (Signed)
Patient ID: [name] [name], female   DOB: 01/08/1941, 71 y.o.   MRN: [id number] Subjective/Complaints: [name] [name] is a 71 y.o. female with history of DM, fall 10 days PTA on 08/18/12 with worsening of HA and 3 day history of running into things on the left with gait problems. Was taking ASA q 6 hours for headaches. MRI head done on outpatient basis with right posterior temporo-occipital hemorrhage and patient admitted for workup. Patient developed seizure on 10/17 pm and was loaded with dilantin and started on Keppra. EEG done revealing moderately severe, continuous nonspecific slowing of cerebral activity with question of encephalopathy v/s post ictal state. She did develop fever with hypotension and patient intubated on 10/18. CCM consulted for input and patient started on IV zosyn briefly for presumed aspiration PNA.  Discussed pt with Dr [name] [name] cardiology Alert No CP no dizziness Review of Systems  Respiratory: Negative for shortness of breath.   Neurological: Positive for weakness. Negative for seizures.  All other systems reviewed and are negative.       Objective: Vital Signs: Blood pressure 112/71, pulse 71, temperature 98.3 F (36.8 C), temperature source Oral, resp. rate 18, weight 50.9 kg (112 lb 3.4 oz), SpO2 97.00%. Gen: NAD Mood/affect slow to respond no agitation Lungs:  Clear Cor:  RRR Abd:  +BS soft NT Ext -C/C/E Motor 4/5 in BUE and BLE Orientation X 3, recognizes me as "Dr."        Results for orders placed during the hospital encounter of 08/27/12 (from the past 72 hour(s))  GLUCOSE, CAPILLARY     Status: Abnormal   Collection Time   09/06/12 12:00 PM      Component Value Range Comment   Glucose-Capillary 191 (*) 70 - 99 mg/dL    Comment 1 Notify RN     GLUCOSE, CAPILLARY     Status: Normal   Collection Time   09/06/12  4:23 PM      Component Value Range Comment   Glucose-Capillary 79  70 - 99 mg/dL   GLUCOSE, CAPILLARY     Status: Normal   Collection Time   09/06/12  9:16 PM      Component Value Range Comment   Glucose-Capillary 97  70 - 99 mg/dL   GLUCOSE, CAPILLARY     Status: Abnormal   Collection Time   09/07/12  7:10 AM      Component Value Range Comment   Glucose-Capillary 132 (*) 70 - 99 mg/dL    Comment 1 Notify RN     GLUCOSE, CAPILLARY     Status: Abnormal   Collection Time   09/07/12  8:28 AM      Component Value Range Comment   Glucose-Capillary 157 (*) 70 - 99 mg/dL   BASIC METABOLIC PANEL     Status: Abnormal   Collection Time   09/07/12  8:56 AM      Component Value Range Comment   Sodium 135  135 - 145 mEq/L    Potassium 4.2  3.5 - 5.1 mEq/L    Chloride 92 (*) 96 - 112 mEq/L    CO2 33 (*) 19 - 32 mEq/L    Glucose, Bld 162 (*) 70 - 99 mg/dL    BUN 24 (*) 6 - 23 mg/dL    Creatinine, Ser 4.09 (*) 0.50 - 1.10 mg/dL    Calcium 9.5  8.4 - 81.1 mg/dL    GFR calc non Af Amer 39 (*) >90 mL/min    GFR calc Af Amer 45 (*) >  90 mL/min   GLUCOSE, CAPILLARY     Status: Abnormal   Collection Time   09/07/12 11:28 AM      Component Value Range Comment   Glucose-Capillary 289 (*) 70 - 99 mg/dL   GLUCOSE, CAPILLARY     Status: Abnormal   Collection Time   09/07/12  4:39 PM      Component Value Range Comment   Glucose-Capillary 194 (*) 70 - 99 mg/dL    Comment 1 Notify RN     GLUCOSE, CAPILLARY     Status: Abnormal   Collection Time   09/07/12  8:58 PM      Component Value Range Comment   Glucose-Capillary 134 (*) 70 - 99 mg/dL    Comment 1 Notify RN     GLUCOSE, CAPILLARY     Status: Abnormal   Collection Time   09/08/12  7:15 AM      Component Value Range Comment   Glucose-Capillary 102 (*) 70 - 99 mg/dL    Comment 1 Notify RN     GLUCOSE, CAPILLARY     Status: Abnormal   Collection Time   09/08/12 11:09 AM      Component Value Range Comment   Glucose-Capillary 313 (*) 70 - 99 mg/dL    Comment 1 Notify RN     GLUCOSE, CAPILLARY     Status: Normal   Collection Time   09/08/12  4:37 PM      Component Value Range Comment    Glucose-Capillary 72  70 - 99 mg/dL    Comment 1 Notify RN     GLUCOSE, CAPILLARY     Status: Abnormal   Collection Time   09/08/12  9:24 PM      Component Value Range Comment   Glucose-Capillary 185 (*) 70 - 99 mg/dL    Comment 1 Notify RN        Assessment/Plan: 1. Functional deficits secondary to R temporo-occipital ICH which require 3+ hours per day of interdisciplinary therapy in a comprehensive inpatient rehab setting. Physiatrist is providing close team supervision and 24 hour management of active medical problems listed below. Physiatrist and rehab team continue to assess barriers to discharge/monitor patient progress toward functional and medical goals. FIM: FIM - Bathing Bathing Steps Patient Completed: Chest;Right Arm;Left Arm;Abdomen;Front perineal area;Buttocks;Right upper leg;Left upper leg;Right lower leg (including foot);Left lower leg (including foot) Bathing: 4: Steadying assist  FIM - Upper Body Dressing/Undressing Upper body dressing/undressing steps patient completed: Thread/unthread right sleeve of pullover shirt/dresss;Thread/unthread left sleeve of pullover shirt/dress;Put head through opening of pull over shirt/dress;Pull shirt over trunk Upper body dressing/undressing: 5: Supervision: Safety issues/verbal cues FIM - Lower Body Dressing/Undressing Lower body dressing/undressing steps patient completed: Thread/unthread right underwear leg;Thread/unthread left underwear leg;Pull underwear up/down;Thread/unthread right pants leg;Thread/unthread left pants leg;Pull pants up/down;Don/Doff right sock;Don/Doff left sock;Don/Doff right shoe;Don/Doff left shoe;Fasten/unfasten right shoe;Fasten/unfasten left shoe Lower body dressing/undressing: 5: Set-up assist to: Obtain clothing  FIM - Toileting Toileting steps completed by patient: Adjust clothing prior to toileting;Performs perineal hygiene;Adjust clothing after toileting Toileting Assistive Devices: Grab bar or rail  for support Toileting: 4: Steadying assist  FIM - Diplomatic Services operational officer Devices: Grab bars Toilet Transfers: 4-To toilet/BSC: Min A (steadying Pt. > 75%);4-From toilet/BSC: Min A (steadying Pt. > 75%)  FIM - Bed/Chair Transfer Bed/Chair Transfer Assistive Devices: Therapist, occupational: 5: Sit > Supine: Supervision (verbal cues/safety issues);4: Bed > Chair or W/C: Min A (steadying Pt. > 75%)  FIM - Locomotion: Wheelchair Distance:  150 Locomotion: Wheelchair: 1: Total Assistance/staff pushes wheelchair (Pt<25%) FIM - Locomotion: Ambulation Locomotion: Ambulation Assistive Devices: Walker - Rolling Ambulation/Gait Assistance: 4: Min assist Locomotion: Ambulation: 2: Travels 50 - 149 ft with minimal assistance (Pt.>75%)  Comprehension Comprehension Mode: Auditory Comprehension: 5-Understands basic 90% of the time/requires cueing < 10% of the time  Expression Expression Mode: Verbal Expression: 5-Expresses basic needs/ideas: With extra time/assistive device  Social Interaction Social Interaction: 5-Interacts appropriately 90% of the time - Needs monitoring or encouragement for participation or interaction.  Problem Solving Problem Solving: 4-Solves basic 75 - 89% of the time/requires cueing 10 - 24% of the time  Memory Memory: 3-Recognizes or recalls 50 - 74% of the time/requires cueing 25 - 49% of the time   Medical Problem List and Plan:  1. DVT Prophylaxis/Anticoagulation: Mechanical: Sequential compression devices, below knee Bilateral lower extremities  2. Pain Management: N/A  3. Mood: difficult to judge at this time due to impaired cognition and poor awareness.  4. Neuropsych: This patient is not capable of making decisions on his/her own behalf.  5. DM type 2 insulin dependent : BS have been poorly controlled. Continue levimir with meal coverage and SSI. Monitor CBG AC/HS checks. Hypoglycemia likely due to novalog.  Glucophage XR just  started 6. Orthostasis: will check ortho static BP in am and set parameters for BP meds. Also with vasovagal episode, EKG ok, IVF at noc d/c in am-cardiology eval no further w/u needed 7. New onset seizures: Continue keppra 250 mg bid. Monitor for recurrence. No signs thus far 8. Resting tachycardia: likely due to deconditioning. Will monitor with bid checks and patient symptoms.  9. Hypokalemia: resolved.  10. ABLA: continue to monitor for trends.  .  11.leucocytosis  No fever 12. Dysphagia: Will nocturnal IVF.D2 thin 13.  Pneumonia vs pleural effusion,  f/u Xray showed no infiltrates or effusion.  Afebrile off Avelox 13.  GERD, on protonix, troponins neg 14. Lethargy intermittent Parieto occipital infarct  may wax and wane related to sleep, fatigue from activity, etc  LOS (Days) 13 A FACE TO FACE EVALUATION WAS PERFORMED  KIRSTEINS,ANDREW E 09/09/2012, 7:21 AM

## 2012-09-10 ENCOUNTER — Inpatient Hospital Stay (HOSPITAL_COMMUNITY): Payer: Federal, State, Local not specified - PPO | Admitting: Physical Therapy

## 2012-09-10 ENCOUNTER — Inpatient Hospital Stay (HOSPITAL_COMMUNITY): Payer: Medicare Other | Admitting: Speech Pathology

## 2012-09-10 LAB — GLUCOSE, CAPILLARY: Glucose-Capillary: 147 mg/dL — ABNORMAL HIGH (ref 70–99)

## 2012-09-10 LAB — BASIC METABOLIC PANEL
BUN: 24 mg/dL — ABNORMAL HIGH (ref 6–23)
CO2: 29 mEq/L (ref 19–32)
Calcium: 9.1 mg/dL (ref 8.4–10.5)
Creatinine, Ser: 1.14 mg/dL — ABNORMAL HIGH (ref 0.50–1.10)

## 2012-09-10 MED ORDER — LEVETIRACETAM 250 MG PO TABS: 250.0000 mg | ORAL_TABLET | Freq: Two times a day (BID) | ORAL | Status: DC

## 2012-09-10 MED ORDER — INSULIN DETEMIR 100 UNIT/ML ~~LOC~~ SOLN: 16.0000 [IU] | Freq: Every day | SUBCUTANEOUS | Status: DC

## 2012-09-10 MED ORDER — SENNOSIDES-DOCUSATE SODIUM 8.6-50 MG PO TABS: 1.0000 | ORAL_TABLET | Freq: Every day | ORAL | Status: DC

## 2012-09-10 MED ORDER — METOPROLOL TARTRATE 25 MG PO TABS: 25.0000 mg | ORAL_TABLET | Freq: Two times a day (BID) | ORAL | Status: DC

## 2012-09-10 NOTE — Progress Notes (Signed)
Patient and spouse received verbal and written discharge instructions from [facility] Denies any questions or concerns. Aware of follow up appointments, personal belongings packed by staff and family.Patient taken down via wheelchair by NT to private vehicle. Roberts-VonCannon, Nayelli Inglis Elon Jester

## 2012-09-10 NOTE — Progress Notes (Signed)
Physical Therapy Discharge Summary  Patient Details  Name: [name] [name] MRN: [id number] Date of Birth: 11/07/1940  Today's Date: 09/10/2012 Time: 4098-1191 Time Calculation (min): 45 min  Patient has met 8 of 8 long term goals due to improved activity tolerance, improved balance, improved postural control, increased strength, increased range of motion, ability to compensate for deficits, functional use of  left upper extremity and left lower extremity, improved attention, improved awareness and improved coordination.  Patient to discharge at ambulatory level with supervision when using RW, min A when ambulating without device . Patient has w/c for longer, community distances.  Patient's care partner is independent to provide the necessary physical assistance at discharge.  Recommendation:  Patient will benefit from ongoing skilled PT services in home health setting to continue to advance safe functional mobility, address ongoing impairments in strength, balance and coordination, activity tolerance and endurance, and minimize fall risk. Patient is currently at a supervision to min A level for mobility.  Equipment: RW provided for safe ambulation at home and for short distances; W/C provided for safe mobility for longer, community distances.   Reasons for discharge: discharge from hospital  Patient/family agrees with progress made and goals achieved: Yes  PT Discharge Precautions/Restrictions Precautions Precautions: Fall Vital Signs Therapy Vitals Temp: 97.3 F (36.3 C) Temp src: Oral Pulse Rate: 73  Resp: 17  BP: 110/58 mmHg Patient Position, if appropriate: Lying Oxygen Therapy SpO2: 98 % O2 Device: None (Room air) Pain Pain Assessment Pain Assessment: No/denies pain   Sensation Sensation Light Touch: Appears Intact Motor  Motor Motor: Hemiplegia  Mobility   Locomotion  Patient has demonstrated ability to ambulate >150 feet requiring min A for steadying/safety when  ambulating with no device; requiring supervision to min guard when ambulating with RW.  See below for stair details.       Balance Standardized Balance Assessment Standardized Balance Assessment: Berg Balance Test Berg Balance Test Sit to Stand: Able to stand without using hands and stabilize independently Standing Unsupported: Able to stand safely 2 minutes Sitting with Back Unsupported but Feet Supported on Floor or Stool: Able to sit safely and securely 2 minutes Stand to Sit: Sits safely with minimal use of hands Transfers: Able to transfer safely, minor use of hands Standing Unsupported with Eyes Closed: Able to stand 10 seconds with supervision Standing Ubsupported with Feet Together: Needs help to attain position but able to stand for 30 seconds with feet together From Standing, Reach Forward with Outstretched Arm: Reaches forward but needs supervision From Standing Position, Pick up Object from Floor: Able to pick up shoe, needs supervision From Standing Position, Turn to Look Behind Over each Shoulder: Turn sideways only but maintains balance Turn 360 Degrees: Needs close supervision or verbal cueing Standing Unsupported, Alternately Place Feet on Step/Stool: Able to complete >2 steps/needs minimal assist Standing Unsupported, One Foot in Front: Able to take small step independently and hold 30 seconds Standing on One Leg: Tries to lift leg/unable to hold 3 seconds but remains standing independently Total Score: 35  -- 15 point improvement from initial examination; indicates patient continues to be at a significant risk for falls. Extremity Assessment      RLE Assessment RLE Assessment: Exceptions to [facility] RLE Strength Right Hip Flexion: 4/5 Right Knee Flexion: 4/5 Right Knee Extension: 4/5 Right Ankle Dorsiflexion: 4/5 LLE Assessment LLE Assessment: Exceptions to [facility] LLE Strength Left Hip Flexion: 3+/5 Left Knee Flexion: 4/5 Left Knee Extension: 4/5 Left Ankle  Dorsiflexion: 3+/5  AM  session: Patient lying in bed eating breakfast upon PT arrival. PT expected delivered DME and made appropriate adjustments while patient was finishing eaingt. Patient's husband was educated on how to correctly adjust patient's walker to correct height verbally, secondary to husband taking RW home already. He gave verbal understanding of process.  Patient propelled w/c ~100 feet from room toward PT gym with superivsion, demonstrating ability to navigate around obstacles safely in a controlled environment. Patient required min vcs for technique. Patient and husband educated on w/c brakes and foot plate management. Patient and husband verbalized and demonstrated understanding.  Patient's balance was assessed using Berg Balance Test. Patient score 35/56 - see above for details.  Patient ascended/descended 15 stairs with BUE support and min guard to min A for steadying/safety. Patient required min vcs for safety (i.e. placing whole foot on step).  PT educated patient's husband on bumping w/c up and down curb, in case patient and husband find themselves without ramp access while in community. Patient verbalized understanding. Patient's husband verbalized and demonstrated understanding.  Patient was returned to room in w/c. Strength and sensation were assessed - see above. Patient left sitting in w/c with call bell in reach, husband at bedside.  See FIM for current functional status  [name] [name] 09/10/2012, 9:58 AM

## 2012-09-10 NOTE — Progress Notes (Signed)
Social Work Discharge Note Discharge Note  The overall goal for the admission was met for:   Discharge location: Yes-HOME WITH HUSBAND PROVIDING 24 HR CARE  Length of Stay: Yes-14 DAYS  Discharge activity level: Yes-SUPERVISION/MIN LEVEL  Home/community participation: Yes  Services provided included: MD, RD, PT, OT, SLP, RN, TR, Pharmacy and SW  Financial Services: Medicare and Private Insurance: BCBS  Follow-up services arranged: Home Health: [facility]-PT, OT,RN, SPT, DME: [facility]-ROLLING [facility]LAKER, WHEELCHAIR,BSC and Patient/Family has no preference for HH/DME agencies  Comments (or additional information):FAMILY EDUCATION COMPLETED BOTH FEEL COMFORTABLE WITH D/C  Patient/Family verbalized understanding of follow-up arrangements: Yes  Individual responsible for coordination of the follow-up plan: ROGER-HUSBAND  Confirmed correct DME delivered: Lucy Chris 09/10/2012    Lucy Chris

## 2012-09-10 NOTE — Progress Notes (Signed)
Patient ID: [name] [name], female   DOB: 06-01-1941, 71 y.o.   MRN: [id number] Subjective/Complaints: [name] [name] is a 71 y.o. female with history of DM, fall 10 days PTA on 08/18/12 with worsening of HA and 3 day history of running into things on the left with gait problems. Was taking ASA q 6 hours for headaches. MRI head done on outpatient basis with right posterior temporo-occipital hemorrhage and patient admitted for workup. Patient developed seizure on 10/17 pm and was loaded with dilantin and started on Keppra. EEG done revealing moderately severe, continuous nonspecific slowing of cerebral activity with question of encephalopathy v/s post ictal state. She did develop fever with hypotension and patient intubated on 10/18. CCM consulted for input and patient started on IV zosyn briefly for presumed aspiration PNA.  Discussed pt with Dr Patty Sermons cardiology Alert, aware of d/c date today No CP no dizziness Review of Systems  Respiratory: Negative for shortness of breath.   Neurological: Positive for weakness. Negative for seizures.  All other systems reviewed and are negative.     Objective: Vital Signs: Blood pressure 110/58, pulse 73, temperature 97.3 F (36.3 C), temperature source Oral, resp. rate 17, weight 51.7 kg (113 lb 15.7 oz), SpO2 98.00%. Gen: NAD Mood/affect slow to respond no agitation Lungs:  Clear Cor:  RRR Abd:  +BS soft NT Ext -C/C/E Motor 4/5 in BUE and BLE Orientation X 3, recognizes me as "Dr."        Results for orders placed during the hospital encounter of 08/27/12 (from the past 72 hour(s))  GLUCOSE, CAPILLARY     Status: Abnormal   Collection Time   09/07/12  7:10 AM      Component Value Range Comment   Glucose-Capillary 132 (*) 70 - 99 mg/dL    Comment 1 Notify RN     GLUCOSE, CAPILLARY     Status: Abnormal   Collection Time   09/07/12  8:28 AM      Component Value Range Comment   Glucose-Capillary 157 (*) 70 - 99 mg/dL   BASIC METABOLIC PANEL     Status:  Abnormal   Collection Time   09/07/12  8:56 AM      Component Value Range Comment   Sodium 135  135 - 145 mEq/L    Potassium 4.2  3.5 - 5.1 mEq/L    Chloride 92 (*) 96 - 112 mEq/L    CO2 33 (*) 19 - 32 mEq/L    Glucose, Bld 162 (*) 70 - 99 mg/dL    BUN 24 (*) 6 - 23 mg/dL    Creatinine, Ser 6.21 (*) 0.50 - 1.10 mg/dL    Calcium 9.5  8.4 - 30.8 mg/dL    GFR calc non Af Amer 39 (*) >90 mL/min    GFR calc Af Amer 45 (*) >90 mL/min   GLUCOSE, CAPILLARY     Status: Abnormal   Collection Time   09/07/12 11:28 AM      Component Value Range Comment   Glucose-Capillary 289 (*) 70 - 99 mg/dL   GLUCOSE, CAPILLARY     Status: Abnormal   Collection Time   09/07/12  4:39 PM      Component Value Range Comment   Glucose-Capillary 194 (*) 70 - 99 mg/dL    Comment 1 Notify RN     GLUCOSE, CAPILLARY     Status: Abnormal   Collection Time   09/07/12  8:58 PM      Component Value Range Comment   Glucose-Capillary  134 (*) 70 - 99 mg/dL    Comment 1 Notify RN     GLUCOSE, CAPILLARY     Status: Abnormal   Collection Time   09/08/12  7:15 AM      Component Value Range Comment   Glucose-Capillary 102 (*) 70 - 99 mg/dL    Comment 1 Notify RN     GLUCOSE, CAPILLARY     Status: Abnormal   Collection Time   09/08/12 11:09 AM      Component Value Range Comment   Glucose-Capillary 313 (*) 70 - 99 mg/dL    Comment 1 Notify RN     GLUCOSE, CAPILLARY     Status: Normal   Collection Time   09/08/12  4:37 PM      Component Value Range Comment   Glucose-Capillary 72  70 - 99 mg/dL    Comment 1 Notify RN     GLUCOSE, CAPILLARY     Status: Abnormal   Collection Time   09/08/12  9:24 PM      Component Value Range Comment   Glucose-Capillary 185 (*) 70 - 99 mg/dL    Comment 1 Notify RN     GLUCOSE, CAPILLARY     Status: Abnormal   Collection Time   09/09/12  7:14 AM      Component Value Range Comment   Glucose-Capillary 139 (*) 70 - 99 mg/dL    Comment 1 Notify RN     GLUCOSE, CAPILLARY     Status:  Abnormal   Collection Time   09/09/12 11:12 AM      Component Value Range Comment   Glucose-Capillary 271 (*) 70 - 99 mg/dL    Comment 1 Notify RN     GLUCOSE, CAPILLARY     Status: Abnormal   Collection Time   09/09/12  4:55 PM      Component Value Range Comment   Glucose-Capillary 134 (*) 70 - 99 mg/dL    Comment 1 Notify RN     GLUCOSE, CAPILLARY     Status: Abnormal   Collection Time   09/09/12  9:08 PM      Component Value Range Comment   Glucose-Capillary 118 (*) 70 - 99 mg/dL    Comment 1 Notify RN     BASIC METABOLIC PANEL     Status: Abnormal   Collection Time   09/10/12  5:15 AM      Component Value Range Comment   Sodium 140  135 - 145 mEq/L    Potassium 5.0  3.5 - 5.1 mEq/L HEMOLYSIS AT THIS LEVEL MAY AFFECT RESULT   Chloride 101  96 - 112 mEq/L    CO2 29  19 - 32 mEq/L    Glucose, Bld 128 (*) 70 - 99 mg/dL    BUN 24 (*) 6 - 23 mg/dL    Creatinine, Ser 1.61 (*) 0.50 - 1.10 mg/dL    Calcium 9.1  8.4 - 09.6 mg/dL    GFR calc non Af Amer 47 (*) >90 mL/min    GFR calc Af Amer 55 (*) >90 mL/min      Assessment/Plan: 1. Functional deficits secondary to R temporo-occipital ICH stable for d/c See D/C summary PM&R f/u 3 wks PCP f/u 2 weeks FIM: FIM - Bathing Bathing Steps Patient Completed: Chest;Right Arm;Left Arm;Abdomen;Front perineal area;Buttocks;Right upper leg;Left upper leg;Right lower leg (including foot);Left lower leg (including foot) Bathing: 5: Supervision: Safety issues/verbal cues  FIM - Upper Body Dressing/Undressing Upper body dressing/undressing steps patient  completed: Thread/unthread right sleeve of pullover shirt/dresss;Thread/unthread left sleeve of pullover shirt/dress;Put head through opening of pull over shirt/dress;Pull shirt over trunk Upper body dressing/undressing: 5: Supervision: Safety issues/verbal cues FIM - Lower Body Dressing/Undressing Lower body dressing/undressing steps patient completed: Thread/unthread right underwear  leg;Thread/unthread left underwear leg;Pull underwear up/down;Thread/unthread right pants leg;Thread/unthread left pants leg;Pull pants up/down;Don/Doff left sock;Don/Doff right sock;Don/Doff right shoe;Don/Doff left shoe;Fasten/unfasten right shoe;Fasten/unfasten left shoe Lower body dressing/undressing: 5: Supervision: Safety issues/verbal cues  FIM - Toileting Toileting steps completed by patient: Adjust clothing prior to toileting;Performs perineal hygiene;Adjust clothing after toileting Toileting Assistive Devices: Grab bar or rail for support Toileting: 4: Steadying assist  FIM - Diplomatic Services operational officer Devices: Art gallery manager Transfers: 5-To toilet/BSC: Supervision (verbal cues/safety issues);5-From toilet/BSC: Supervision (verbal cues/safety issues)  FIM - Banker Devices: Therapist, occupational: 5: Sit > Supine: Supervision (verbal cues/safety issues);5: Bed > Chair or W/C: Supervision (verbal cues/safety issues);4: Chair or W/C > Bed: Min A (steadying Pt. > 75%)  FIM - Locomotion: Wheelchair Distance: 150 Locomotion: Wheelchair: 0: Activity did not occur FIM - Locomotion: Ambulation Locomotion: Ambulation Assistive Devices: Walker - Rolling;Other (comment) (HHA also with no device) Ambulation/Gait Assistance: 4: Min assist;5: Supervision (S with RW and Min A with no device ) Locomotion: Ambulation: 4: Travels 150 ft or more with minimal assistance (Pt.>75%)  Comprehension Comprehension Mode: Auditory Comprehension: 5-Understands basic 90% of the time/requires cueing < 10% of the time  Expression Expression Mode: Verbal Expression: 5-Expresses basic 90% of the time/requires cueing < 10% of the time.  Social Interaction Social Interaction: 5-Interacts appropriately 90% of the time - Needs monitoring or encouragement for participation or interaction.  Problem Solving Problem Solving: 4-Solves basic 75 - 89% of  the time/requires cueing 10 - 24% of the time  Memory Memory: 3-Recognizes or recalls 50 - 74% of the time/requires cueing 25 - 49% of the time   Medical Problem List and Plan:  1. DVT Prophylaxis/Anticoagulation: Mechanical: Sequential compression devices, below knee Bilateral lower extremities  2. Pain Management: N/A  3. Mood: difficult to judge at this time due to impaired cognition and poor awareness.  4. Neuropsych: This patient is not capable of making decisions on his/her own behalf.  5. DM type 2 insulin dependent : BS have been poorly controlled. Continue levimir with meal coverage and SSI. Monitor CBG AC/HS checks. Hypoglycemia likely due to novalog.  Glucophage XR just started 6. Orthostasis: will check ortho static BP in am and set parameters for BP meds. Also with vasovagal episode, EKG ok, IVF at noc d/c in am-cardiology eval no further w/u needed 7. New onset seizures: Continue keppra 250 mg bid. Monitor for recurrence. No signs thus far 8. Resting tachycardia: likely due to deconditioning. Will monitor with bid checks and patient symptoms.  9. Hypokalemia: resolved.  10. ABLA: continue to monitor for trends.  .  11.leucocytosis  No fever 12. Dysphagia: Will nocturnal IVF.D2 thin 13.  Pneumonia vs pleural effusion,  f/u Xray showed no infiltrates or effusion.  Afebrile off Avelox 13.  GERD, on protonix, troponins neg 14. Lethargy intermittent Parieto occipital infarct  may wax and wane related to sleep, fatigue from activity, etc  LOS (Days) 14 A FACE TO FACE EVALUATION WAS PERFORMED  KIRSTEINS,ANDREW E 09/10/2012, 6:54 AM

## 2012-09-10 NOTE — Progress Notes (Signed)
Speech Language Pathology Daily Session Note & Discharge Summary  Patient Details  Name: [name] [name] MRN: [id number] Date of Birth: [birth year]  Today's Date: 09/10/2012 Time: 0835-0900 Time Calculation (min): 25 min  Short Term Goals: Week 3: SLP Short Term Goal 1 (Week 3): Patient will tolerate Dys. 3 textures and thin liquids with min assist verbal cues for use of compensatory strategies.  SLP Short Term Goal 2 (Week 3): Patient will selectively attend to a basic functional task for approximately 10 minutes with min assist verbal cues for redirection.  SLP Short Term Goal 3 (Week 3): Patient will utilize compensatory strategies to facilitate increased recall of new information with mod-max assist verbal cues. SLP Short Term Goal 4 (Week 3): Patient will solve basic problems during a familiar task with min assist verbal cues.  SLP Short Term Goal 5 (Week 3): Patient will demonstrate emergent awareness by asking for help as needed during basic functional tasks with min assist question cues.   Skilled Therapeutic Interventions: Treatment addressed dysphagia management and cognition with emphasis on family education. Patient required set up assistance with breakfast tray to compensate for visuospatial deficits. Patient consumed Dys. 3 textures and thin liquids with supervision cues to take small bites and perform multiple swallows per bite/sip and exhibited no overt s/s of aspiration during the session. Husband was present and demonstrated appropriate cues during p.o. intake. SLP also facilitated session by educating husband regarding compensating for cognitive deficits and appropriate textures for p.o. intake at home; husband verbalized understanding and SLP provided with handouts.    FIM:  Comprehension Comprehension Mode: Auditory Comprehension: 5-Follows basic conversation/direction: With extra time/assistive device Expression Expression Mode: Verbal Expression: 5-Expresses basic  needs/ideas: With extra time/assistive device Social Interaction Social Interaction: 5-Interacts appropriately 90% of the time - Needs monitoring or encouragement for participation or interaction. Problem Solving Problem Solving: 4-Solves basic 75 - 89% of the time/requires cueing 10 - 24% of the time Memory Memory: 3-Recognizes or recalls 50 - 74% of the time/requires cueing 25 - 49% of the time  Pain Pain Assessment Pain Assessment: No/denies pain  Therapy/Group: Individual Therapy   Speech Language Pathology Discharge Summary  Patient Details  Name: [name] [name] MRN: [id number] Date of Birth: 1941/08/03  Today's Date: 09/10/2012  Patient has met 8 of 8 long term goals.  Patient to discharge at overall Supervision;Min level.  Reasons goals not met: n/a   Clinical Impression/Discharge Summary: Patient met 8 out of 8 long term goals with improvements in swallow function and cognition.  Upon admission patient was requiring max-total assist to perform basic tasks such as self-feeding with [place] e of compensatory strategies and recall daily information, now at discharge she is requiring supervision-min assist mostly due to memory deficits and visuospatial deficits.  Her diet was progressed from Dys.1 textures with honey-thick liquids via teaspoon to dys.3 textures with thin via cup and use of 2 swallow per bite/sip.  Husband has been present for therapies and family education s completed at this time.  However, it is recommended that this patient receive skilled SLP services at the next level of care to maximize functional independence and further reduce the burden of care.   Care Partner:  Caregiver Able to Provide Assistance: Yes  Type of Caregiver Assistance: Cognitive (dysphagia)  Recommendation:  Home Health SLP;24 hour supervision/assistance  Rationale for SLP Follow Up: Maximize cognitive function and independence;Maximize swallowing safety;Reduce caregiver burden   Equipment: none     Reasons for discharge: Treatment goals met;Discharged from  hospital   Patient/Family Agrees with Progress Made and Goals Achieved: Yes   See FIM for current functional status  Charlane Ferretti., CCC-SLP [phone]  Vivaan Helseth 09/10/2012, 9:09 AM

## 2012-09-10 NOTE — Plan of Care (Signed)
Problem: RH BOWEL ELIMINATION Goal: RH STG MANAGE BOWEL W/MEDICATION W/ASSISTANCE STG Manage Bowel with Medication with mod Assistance.  Outcome: Completed/Met Date Met:  09/10/12 Spouse gives the patient her medication per his statement

## 2012-09-10 NOTE — Progress Notes (Signed)
I have read and agree with the following treatment session and discharge.  Edman Circle, PT, DPT

## 2012-09-20 NOTE — Progress Notes (Signed)
Discharge summary # [phone]

## 2012-09-21 ENCOUNTER — Telehealth: Payer: Self-pay

## 2012-09-21 NOTE — Telephone Encounter (Signed)
Dr Clelia Croft, I'm routing this to you FYI.

## 2012-09-21 NOTE — Telephone Encounter (Signed)
That's the pt's choice.

## 2012-09-21 NOTE — Discharge Summary (Signed)
[name], [name]                 ACCOUNT NO.:  [id number]  MEDICAL RECORD NO.:  [id number]  LOCATION:  4005                         FACILITY:  [facility]  PHYSICIAN:  Erick Colace, M.D.DATE OF BIRTH:  June 30, 1941  DATE OF ADMISSION:  08/27/2012 DATE OF DISCHARGE:  09/10/2012                              DISCHARGE SUMMARY   DISCHARGE DIAGNOSES: 1. Right temporal occipital intracerebral hemorrhage. 2. Diabetes mellitus type 2 insulin-dependent. 3. Vasovagal episode due to orthostasis. 4. New-onset seizures. 5. Gastroesophageal reflux disease. 6. Acute renal insufficiency.  HISTORY OF PRESENT ILLNESS:  [name] is a 71 year old female with history of diabetes mellitus, falls prior to admission on August 18, 2012, with worsening of headache and 3-day history of running into things that left.  MRI done on outpatient basis showed right posterior temporal occipital hemorrhage, and the patient was admitted for workup. On August 19, 2012, she developed seizure and was loaded with Dilantin and was started on Keppra.  EEG done revealed moderate to severe continuous nonspecific slowing of cerebral activity with question of encephalopathy versus postictal state.  Hospital course has been complicated by hypotension requiring intubation.  She was treated with IV antibiotics for presumed aspiration pneumonia.  She has had bouts of lethargy interspersed with agitation and Keppra dose was decreased to 500 mg b.i.d.  Neurology felt that hemorrhage was due to hypertension and excessive aspirin use.  She was extubated without difficulty and confusion and agitation are resolving.  She is tolerating D1 diet honey liquids by teaspoon.  She continues to have visual deficits as well as cognitive deficits requiring extra time for processing and cues for safety.  Therapy team recommended CIR for progression.  PAST MEDICAL HISTORY:  Diabetes mellitus and GERD.  FUNCTIONAL HISTORY:  The patient  was independent without assisted device prior to admission.  FUNCTIONAL STATUS:  The patient required max assist for bed mobility, +2 total assist 60% for stand pivot transfers.  Total assist for ADLs.  RECENT LABORATORY DATA:  Check of lytes from September 10, 2012, revealed sodium 140, potassium 5.0, chloride 101, CO2 29, BUN 24, creatinine 1.14, glucose 128.  Specimen hemolyzed.  CBC from September 05, 2012, revealed hemoglobin 10.7, hematocrit 33.7, white count 12.5, platelets 602.  RADIOLOGIC STUDIES:  Followup chest x-ray from September 05, 2012, revealed mild diffuse interstitial prominence and bronchial wall and thickening chronic.  No consolidative airspace disease.  HOSPITAL COURSE:  [name] was admitted to Rehab on September 27, 2012, for inpatient therapies to consist of PT, OT, and speech therapy at least 3 hours 5 days a week.  Past admission, physiatrist, rehab, RN, and therapy team have worked together to provide customized collaborative interdisciplinary care.  Rehab RN has worked with the patient on bowel and bladder program as well as skin monitoring.  The patient was started on IV fluids at bedtime due to her dysphagia. She was noted to have an orthostatic episode past admission due to  Dehydration and was treated with nocturnal IV fluids until she was advanced to thin liquids.  She did have another vasovagal episode while on the commode.  An EKG done showed no changes.  Cardiology was  consulted for input and felt the patient had vasovagal episode due to straining and hypotension and that no further workup was indicated.  The patient's blood sugars have been checked on before meal and at bedtime basis.  Blood sugars have ranged from 130s to occasional high in 200s. Tight control was not initiated due to hypoglycemic episodes.  The patient's husband was advised on use home sliding scale insulin past discharge to help with blood sugar management.  During the  patient's stay in rehab, weekly team conferences were held to monitor the patient's progress, set goals, as well as discuss barriers to discharge.  Physical Therapy has worked with the patient on balance strengthening as well as postural control.  The patient has improved in functional use of left upper and left lower extremity. She is also showing improvement in attention, awareness, and coordination.  She is able to ambulate at supervision level with use of rolling walker. She requires min assist for steadying and safety when ambulating without a device.  She is able to propel a wheelchair 100 feet at supervision level.  She is able to navigate 15 stairs with bilateral upper extremity support with min to min guard assist for steadying.  Family education was done with husband regarding need for supervision as well as assistance. OT has worked with the patient on self-care tasks.  The patient is at supervision level for bathing and dressing.  Speech Therapy has worked with the patient on dysphagia treatment.  The patient is currently tolerating D3 diet, thin liquids past setup assist to compensate for visual spatial deficits.  She requires supervision with cues to take small bites and perform multiple swallows.  The patient is able to follow basic conversation direction with extra time and assistance.  She is able to express basic wants and needs with extra time.  She continues to have difficulty with memory and problem solving requiring cuing. Further follow up home health, PT, OT, and speech therapy to continue past discharge.  On September 10, 2012, the patient is discharged to home in improved condition.  DISCHARGE MEDICATIONS: 1. Senna S one p.o. q p.m. 2. Levemir 16 units subcu q.a.m. 3. Keppra 250 mg p.o. b.i.d. 4. Lopressor 25 mg p.o. b.i.d. 5. Tylenol 650 mg p.o. q.6 hours p.r.n. pain. 6. Multivitamin 1 p.o. per day. 7. Prilosec 40 mg p.o. per day. 8. Vitamin C 500 mg p.o. per  day.  DIET:  Carb modified medium.  ACTIVITY LEVEL:  24-hour supervision and assistance.  SPECIAL INSTRUCTIONS:  Do not use metformin. Drink at least 5 glasses of water daily.  Check blood sugars before meals and at bedtime.  Use sliding scale insulin.  Eat a snack at bedtime if blood sugar less than 150.  Do not leave unattended on commode.  [facility] to provide PT, OT, RN and speech therapy.  FOLLOWUP:  The patient to follow up with Dr. Claudette Laws on September 27, 2012, at 1:30 p.m.  Follow up with Dr. Pearlean Brownie in 4-6 weeks. Follow up with Dr. Aida Puffer, September 20, 2012, at 10:15 p.m.     Delle Reining, P.A.   ______________________________ Erick Colace, M.D.    PL/MEDQ  D:  09/20/2012  T:  09/21/2012  Job:  [id number]  cc:   Pramod P. Pearlean Brownie, MD Aida Puffer, MD

## 2012-09-21 NOTE — Telephone Encounter (Signed)
Dr. Clelia Croft- Dr. Laruth Bouchard office called stating patient did not want appointment at this time.

## 2012-09-27 ENCOUNTER — Encounter: Payer: Federal, State, Local not specified - PPO | Attending: Physical Medicine & Rehabilitation

## 2012-09-27 ENCOUNTER — Ambulatory Visit (HOSPITAL_BASED_OUTPATIENT_CLINIC_OR_DEPARTMENT_OTHER): Payer: Federal, State, Local not specified - PPO | Admitting: Physical Medicine & Rehabilitation

## 2012-09-27 ENCOUNTER — Encounter: Payer: Self-pay | Admitting: Physical Medicine & Rehabilitation

## 2012-09-27 VITALS — BP 129/72 | HR 81 | Resp 16 | Ht 65.0 in | Wt 104.0 lb

## 2012-09-27 DIAGNOSIS — R279 Unspecified lack of coordination: Secondary | ICD-10-CM | POA: Insufficient documentation

## 2012-09-27 DIAGNOSIS — I69919 Unspecified symptoms and signs involving cognitive functions following unspecified cerebrovascular disease: Secondary | ICD-10-CM

## 2012-09-27 DIAGNOSIS — I619 Nontraumatic intracerebral hemorrhage, unspecified: Secondary | ICD-10-CM | POA: Insufficient documentation

## 2012-09-27 DIAGNOSIS — I69119 Unspecified symptoms and signs involving cognitive functions following nontraumatic intracerebral hemorrhage: Secondary | ICD-10-CM

## 2012-09-27 DIAGNOSIS — F09 Unspecified mental disorder due to known physiological condition: Secondary | ICD-10-CM | POA: Insufficient documentation

## 2012-09-27 NOTE — Progress Notes (Signed)
Subjective:    Patient ID: [name] [name], female    DOB: 1941-10-08, 71 y.o.   MRN: [id number] [name] is a 71 year old female  with history of diabetes mellitus, falls prior to admission on August 18, 2012, with worsening of headache and 3-day history of running into  things that left. MRI done on outpatient basis showed right posterior  temporal occipital hemorrhage, and the patient was admitted for workup.  On August 19, 2012, she developed seizure and was loaded with Dilantin  and was started on Keppra. EEG done revealed moderate to severe  continuous nonspecific slowing of cerebral activity with question of  encephalopathy versus postictal state.  HPI Followed up with primary care physician for blood pressure as well as diabetes Has appointment with neurology in January Does not recall seeing me in the hospital No seizure No fall Home health PT and OT Requires assistance for bathing and dressing Walks with a walker. Pain Inventory Average Pain 2 Pain Right Now 0 My pain is soreness  In the last 24 hours, has pain interfered with the following? General activity 0 Relation with others 0 Enjoyment of life 0 What TIME of day is your pain at its worst? night Sleep (in general) Fair  Pain is worse with: unsure Pain improves with: unsure Relief from Meds: 0  Mobility use a walker how many minutes can you walk? 10 ability to climb steps?  no do you drive?  no use a wheelchair needs help with transfers Do you have any goals in this area?  yes  Function retired I need assistance with the following:  feeding, dressing, bathing, meal prep, household duties and shopping  Neuro/Psych bladder control problems tremor confusion  Prior Studies Any changes since last visit?  no  Physicians involved in your care Any changes since last visit?  no   Family History  Problem Relation Age of Onset  . Cancer Mother   . Cancer Father    History   Social  History  . Marital Status: Married    Spouse Name: N/A    Number of Children: N/A  . Years of Education: N/A   Social History Main Topics  . Smoking status: Never Smoker   . Smokeless tobacco: None  . Alcohol Use: No  . Drug Use: No  . Sexually Active:    Other Topics Concern  . None   Social History Narrative  . None   History reviewed. No pertinent past surgical history. Past Medical History  Diagnosis Date  . Diabetes mellitus   . GERD (gastroesophageal reflux disease)    BP 129/72  Pulse 81  Resp 16  Ht 5\' 5"  (1.651 m)  Wt 104 lb (47.174 kg)  BMI 17.31 kg/m2  SpO2 96%    Review of Systems  Constitutional: Positive for diaphoresis.  Gastrointestinal: Positive for nausea, abdominal pain and constipation.  Genitourinary: Positive for dysuria and decreased urine volume.  Musculoskeletal: Positive for gait problem.  Neurological: Positive for tremors.  Psychiatric/Behavioral: Positive for confusion.  All other systems reviewed and are negative.       Objective:   Physical Exam  Constitutional: She appears well-developed and well-nourished.  HENT:  Head: Normocephalic and atraumatic.  Eyes: Conjunctivae normal and EOM are normal. Pupils are equal, round, and reactive to light.  Neurological: She is alert. She has normal strength. No sensory deficit.       Visual fields are intact to confrontation testing  Psychiatric: She has a normal mood  and affect. Her speech is normal and behavior is normal. Thought content normal. Cognition and memory are impaired. She exhibits abnormal recent memory.       Oriented to person and "cone"          Assessment & Plan:  1.Right temporal parietal hemorrhage with mild to moderate balance problems and moderate cognitive deficits. Will finish home health therapy then reassess. She may need more cognitive-based therapy as an outpatient if not better Recommend followup with primary care Recommend followup with  neurology Recommend followup with endocrinology

## 2012-09-27 NOTE — Patient Instructions (Addendum)
See me in about 3 weeks. I do not think she needs to limit her walking, your blood pressure is good

## 2012-10-18 ENCOUNTER — Ambulatory Visit: Payer: Federal, State, Local not specified - PPO | Admitting: Physical Medicine & Rehabilitation

## 2012-10-29 ENCOUNTER — Ambulatory Visit (HOSPITAL_BASED_OUTPATIENT_CLINIC_OR_DEPARTMENT_OTHER): Payer: Federal, State, Local not specified - PPO | Admitting: Physical Medicine & Rehabilitation

## 2012-10-29 ENCOUNTER — Encounter: Payer: Self-pay | Admitting: Physical Medicine & Rehabilitation

## 2012-10-29 ENCOUNTER — Encounter: Payer: Federal, State, Local not specified - PPO | Attending: Physical Medicine & Rehabilitation

## 2012-10-29 VITALS — BP 117/67 | HR 74 | Resp 14 | Ht 65.5 in | Wt 106.4 lb

## 2012-10-29 DIAGNOSIS — F09 Unspecified mental disorder due to known physiological condition: Secondary | ICD-10-CM | POA: Insufficient documentation

## 2012-10-29 DIAGNOSIS — I619 Nontraumatic intracerebral hemorrhage, unspecified: Secondary | ICD-10-CM | POA: Insufficient documentation

## 2012-10-29 DIAGNOSIS — I69919 Unspecified symptoms and signs involving cognitive functions following unspecified cerebrovascular disease: Secondary | ICD-10-CM

## 2012-10-29 DIAGNOSIS — R279 Unspecified lack of coordination: Secondary | ICD-10-CM | POA: Insufficient documentation

## 2012-10-29 DIAGNOSIS — I69119 Unspecified symptoms and signs involving cognitive functions following nontraumatic intracerebral hemorrhage: Secondary | ICD-10-CM | POA: Insufficient documentation

## 2012-10-29 NOTE — Patient Instructions (Signed)
He may call for a referral to speech therapy if you decide to pursue this as an outpatient

## 2012-10-29 NOTE — Progress Notes (Signed)
Subjective:    Patient ID: [name] [name], female    DOB: January 30, 1941, 71 y.o.   MRN: [id number] [name] is a 71 year old female  with history of diabetes mellitus, falls prior to admission on August 18, 2012, with worsening of headache and 3-day history of running into  things that left. MRI done on outpatient basis showed right posterior  temporal occipital hemorrhage, and the patient was admitted for workup.  On August 19, 2012, she developed seizure and was loaded with Dilantin  and was started on Keppra. EEG done revealed moderate to severe  continuous nonspecific slowing of cerebral activity with question of  encephalopathy versus postictal state.  HPI Followed up with primary care physician for blood pressure as well as diabetes  Has appointment with neurology in January   No seizure  No fall  Home health PT and OT Have finished, nursing still comes. Requires assistance for bathing and dressing  Walks without a walker or cane.  Pain Inventory Average Pain 3 Pain Right Now 3 My pain is sharp  In the last 24 hours, has pain interfered with the following? General activity 0 Relation with others 0 Enjoyment of life 0 What TIME of day is your pain at its worst? morning Sleep (in general) Poor  Pain is worse with: unsure Pain improves with: medication Relief from Meds: 10  Mobility walk without assistance how many minutes can you walk? 15 ability to climb steps?  yes do you drive?  no  Function retired I need assistance with the following:  bathing and meal prep  Neuro/Psych bladder control problems weakness tremor confusion  Prior Studies Any changes since last visit?  no  Physicians involved in your care Any changes since last visit?  no   Family History  Problem Relation Age of Onset  . Cancer Mother   . Cancer Father    History   Social History  . Marital Status: Married    Spouse Name: N/A    Number of Children: N/A  . Years of  Education: N/A   Social History Main Topics  . Smoking status: Never Smoker   . Smokeless tobacco: Never Used  . Alcohol Use: No  . Drug Use: No  . Sexually Active: None   Other Topics Concern  . None   Social History Narrative  . None   History reviewed. No pertinent past surgical history. Past Medical History  Diagnosis Date  . Diabetes mellitus   . GERD (gastroesophageal reflux disease)    BP 117/67  Pulse 74  Resp 14  Ht 5' 5.5" (1.664 m)  Wt 106 lb 6.4 oz (48.263 kg)  BMI 17.44 kg/m2  SpO2 95%   Review of Systems  Gastrointestinal: Positive for abdominal pain.  Genitourinary:       Bladder control  Neurological: Positive for tremors and weakness.  Psychiatric/Behavioral: Positive for confusion.  All other systems reviewed and are negative.       Objective:   Physical Exam  Constitutional: She appears well-developed and well-nourished.  HENT:  Head: Normocephalic and atraumatic.  Eyes: Conjunctivae normal and EOM are normal. Pupils are equal, round, and reactive to light.  Neurological: She is alert. She has normal strength. No sensory deficit.  Visual fields are intact to confrontation testing  Psychiatric: She has a normal mood and affect. Her speech is normal and behavior is normal. Thought content normal. Cognition and memory are impaired. She exhibits abnormal recent memory.  Oriented to person and "cone"  remebers 1/3 objects      Assessment & Plan:  1.Right temporal parietal hemorrhage with minimal  balance problems and moderate cognitive deficits. . She may need more cognitive-based therapy as an outpatient,I recommended this and offered a referral but she would like to think about it. Recommend followup with primary care  Recommend followup with neurology  Recommend followup with endocrinology

## 2012-12-11 ENCOUNTER — Encounter (HOSPITAL_COMMUNITY): Payer: Self-pay | Admitting: *Deleted

## 2012-12-11 ENCOUNTER — Ambulatory Visit (INDEPENDENT_AMBULATORY_CARE_PROVIDER_SITE_OTHER): Payer: Federal, State, Local not specified - PPO | Admitting: Family Medicine

## 2012-12-11 ENCOUNTER — Emergency Department (HOSPITAL_COMMUNITY): Payer: Medicare Other

## 2012-12-11 ENCOUNTER — Inpatient Hospital Stay (HOSPITAL_COMMUNITY)
Admission: EM | Admit: 2012-12-11 | Discharge: 2012-12-13 | DRG: 853 | Disposition: A | Discharge status: Home / Self Care | Payer: Medicare Other | Attending: Family Medicine | Admitting: Family Medicine

## 2012-12-11 VITALS — BP 148/80 | HR 84 | Temp 99.2°F | Resp 18

## 2012-12-11 DIAGNOSIS — G40909 Epilepsy, unspecified, not intractable, without status epilepticus: Secondary | ICD-10-CM | POA: Diagnosis present

## 2012-12-11 DIAGNOSIS — I69119 Unspecified symptoms and signs involving cognitive functions following nontraumatic intracerebral hemorrhage: Secondary | ICD-10-CM

## 2012-12-11 DIAGNOSIS — F411 Generalized anxiety disorder: Secondary | ICD-10-CM | POA: Diagnosis present

## 2012-12-11 DIAGNOSIS — R51 Headache: Secondary | ICD-10-CM

## 2012-12-11 DIAGNOSIS — E118 Type 2 diabetes mellitus with unspecified complications: Secondary | ICD-10-CM | POA: Diagnosis present

## 2012-12-11 DIAGNOSIS — IMO0001 Reserved for inherently not codable concepts without codable children: Secondary | ICD-10-CM

## 2012-12-11 DIAGNOSIS — Z794 Long term (current) use of insulin: Secondary | ICD-10-CM

## 2012-12-11 DIAGNOSIS — R1110 Vomiting, unspecified: Secondary | ICD-10-CM

## 2012-12-11 DIAGNOSIS — R29818 Other symptoms and signs involving the nervous system: Secondary | ICD-10-CM

## 2012-12-11 DIAGNOSIS — E119 Type 2 diabetes mellitus without complications: Secondary | ICD-10-CM

## 2012-12-11 DIAGNOSIS — N183 Chronic kidney disease, stage 3 unspecified: Secondary | ICD-10-CM

## 2012-12-11 DIAGNOSIS — B9789 Other viral agents as the cause of diseases classified elsewhere: Principal | ICD-10-CM | POA: Diagnosis present

## 2012-12-11 DIAGNOSIS — I129 Hypertensive chronic kidney disease with stage 1 through stage 4 chronic kidney disease, or unspecified chronic kidney disease: Secondary | ICD-10-CM | POA: Diagnosis present

## 2012-12-11 DIAGNOSIS — G459 Transient cerebral ischemic attack, unspecified: Secondary | ICD-10-CM

## 2012-12-11 DIAGNOSIS — Z79899 Other long term (current) drug therapy: Secondary | ICD-10-CM

## 2012-12-11 DIAGNOSIS — I69998 Other sequelae following unspecified cerebrovascular disease: Secondary | ICD-10-CM

## 2012-12-11 DIAGNOSIS — R269 Unspecified abnormalities of gait and mobility: Secondary | ICD-10-CM

## 2012-12-11 DIAGNOSIS — R1310 Dysphagia, unspecified: Secondary | ICD-10-CM | POA: Diagnosis present

## 2012-12-11 DIAGNOSIS — H53469 Homonymous bilateral field defects, unspecified side: Secondary | ICD-10-CM

## 2012-12-11 DIAGNOSIS — K219 Gastro-esophageal reflux disease without esophagitis: Secondary | ICD-10-CM | POA: Diagnosis present

## 2012-12-11 DIAGNOSIS — J381 Polyp of vocal cord and larynx: Secondary | ICD-10-CM | POA: Diagnosis present

## 2012-12-11 DIAGNOSIS — E43 Unspecified severe protein-calorie malnutrition: Secondary | ICD-10-CM | POA: Diagnosis present

## 2012-12-11 DIAGNOSIS — Z8679 Personal history of other diseases of the circulatory system: Secondary | ICD-10-CM

## 2012-12-11 DIAGNOSIS — H53462 Homonymous bilateral field defects, left side: Secondary | ICD-10-CM

## 2012-12-11 LAB — CBC WITH DIFFERENTIAL/PLATELET
Basophils Absolute: 0 10*3/uL (ref 0.0–0.1)
Lymphocytes Relative: 26 % (ref 12–46)
Lymphs Abs: 2.7 10*3/uL (ref 0.7–4.0)
Neutrophils Relative %: 64 % (ref 43–77)
Platelets: 195 10*3/uL (ref 150–400)
RBC: 4.09 MIL/uL (ref 3.87–5.11)
WBC: 10.3 10*3/uL (ref 4.0–10.5)

## 2012-12-11 LAB — POCT I-STAT, CHEM 8
Creatinine, Ser: 1.3 mg/dL — ABNORMAL HIGH (ref 0.50–1.10)
HCT: 42 % (ref 36.0–46.0)
Hemoglobin: 14.3 g/dL (ref 12.0–15.0)
Potassium: 4.9 mEq/L (ref 3.5–5.1)
Sodium: 137 mEq/L (ref 135–145)

## 2012-12-11 LAB — TROPONIN I
Troponin I: <0.3 ng/mL (ref <0.30)
Troponin I: <0.3 ng/mL (ref <0.30)

## 2012-12-11 LAB — URINALYSIS, ROUTINE W REFLEX MICROSCOPIC
Leukocytes, UA: NEGATIVE
Nitrite: NEGATIVE
Specific Gravity, Urine: 1.014 (ref 1.005–1.030)
Urobilinogen, UA: 0.2 mg/dL (ref 0.0–1.0)

## 2012-12-11 LAB — GLUCOSE, CAPILLARY
Glucose-Capillary: 191 mg/dL — ABNORMAL HIGH (ref 70–99)
Glucose-Capillary: 111 mg/dL — ABNORMAL HIGH (ref 70–99)

## 2012-12-11 MED ORDER — ADULT MULTIVITAMIN W/MINERALS CH
1.0000 | ORAL_TABLET | Freq: Every day | ORAL | Status: DC
  Administered 2012-12-13: 1 via ORAL
  Filled 2012-12-11 (×2): qty 1

## 2012-12-11 MED ORDER — ONDANSETRON HCL 4 MG/2ML IJ SOLN: 4.0000 mg | Freq: Four times a day (QID) | INTRAMUSCULAR | Status: DC | PRN

## 2012-12-11 MED ORDER — ACETAMINOPHEN 650 MG RE SUPP
650.0000 mg | Freq: Four times a day (QID) | RECTAL | Status: DC | PRN
  Administered 2012-12-11 – 2012-12-12 (×2): 650 mg via RECTAL
  Filled 2012-12-11 (×3): qty 1

## 2012-12-11 MED ORDER — ACETAMINOPHEN 650 MG RE SUPP
650.0000 mg | RECTAL | Status: DC | PRN
  Administered 2012-12-12: 650 mg via RECTAL

## 2012-12-11 MED ORDER — ONDANSETRON HCL 4 MG PO TABS: 4.0000 mg | ORAL_TABLET | Freq: Four times a day (QID) | ORAL | Status: DC | PRN

## 2012-12-11 MED ORDER — SODIUM CHLORIDE 0.9 % IV BOLUS (SEPSIS)
500.0000 mL | Freq: Once | INTRAVENOUS | Status: AC
  Administered 2012-12-11: 500 mL via INTRAVENOUS

## 2012-12-11 MED ORDER — SENNOSIDES-DOCUSATE SODIUM 8.6-50 MG PO TABS: 1.0000 | ORAL_TABLET | Freq: Every evening | ORAL | Status: DC | PRN

## 2012-12-11 MED ORDER — INSULIN ASPART 100 UNIT/ML ~~LOC~~ SOLN: 0.0000 [IU] | Freq: Every day | SUBCUTANEOUS | Status: DC

## 2012-12-11 MED ORDER — VITAMIN C 500 MG PO TABS
500.0000 mg | ORAL_TABLET | Freq: Every day | ORAL | Status: DC
  Administered 2012-12-13: 500 mg via ORAL
  Filled 2012-12-11 (×2): qty 1

## 2012-12-11 MED ORDER — INSULIN DETEMIR 100 UNIT/ML ~~LOC~~ SOLN
6.0000 [IU] | Freq: Every day | SUBCUTANEOUS | Status: DC
  Administered 2012-12-12 – 2012-12-13 (×2): 6 [IU] via SUBCUTANEOUS
  Filled 2012-12-11: qty 10

## 2012-12-11 MED ORDER — INSULIN ASPART 100 UNIT/ML ~~LOC~~ SOLN
0.0000 [IU] | Freq: Three times a day (TID) | SUBCUTANEOUS | Status: DC
  Administered 2012-12-12: 2 [IU] via SUBCUTANEOUS
  Administered 2012-12-13: 3 [IU] via SUBCUTANEOUS
  Administered 2012-12-13: 9 [IU] via SUBCUTANEOUS
  Administered 2012-12-13: 10 [IU] via SUBCUTANEOUS

## 2012-12-11 MED ORDER — ACETAMINOPHEN 325 MG PO TABS
650.0000 mg | ORAL_TABLET | Freq: Four times a day (QID) | ORAL | Status: DC | PRN
  Administered 2012-12-12: 650 mg via ORAL
  Filled 2012-12-11 (×2): qty 2

## 2012-12-11 MED ORDER — METOPROLOL TARTRATE 25 MG PO TABS
25.0000 mg | ORAL_TABLET | Freq: Two times a day (BID) | ORAL | Status: DC
  Administered 2012-12-12 – 2012-12-13 (×2): 25 mg via ORAL
  Filled 2012-12-11 (×5): qty 1

## 2012-12-11 MED ORDER — LEVETIRACETAM 250 MG PO TABS
250.0000 mg | ORAL_TABLET | Freq: Two times a day (BID) | ORAL | Status: DC
  Administered 2012-12-12 – 2012-12-13 (×2): 250 mg via ORAL
  Filled 2012-12-11 (×5): qty 1

## 2012-12-11 MED ORDER — PANTOPRAZOLE SODIUM 40 MG PO TBEC: 40.0000 mg | DELAYED_RELEASE_TABLET | Freq: Every day | ORAL | Status: DC

## 2012-12-11 MED ORDER — ACETAMINOPHEN 325 MG PO TABS
650.0000 mg | ORAL_TABLET | ORAL | Status: DC | PRN
  Administered 2012-12-13: 650 mg via ORAL

## 2012-12-11 MED ORDER — SODIUM CHLORIDE 0.9 % IV SOLN
INTRAVENOUS | Status: DC
  Administered 2012-12-11 – 2012-12-12 (×2): via INTRAVENOUS

## 2012-12-11 NOTE — ED Provider Notes (Signed)
History     CSN: [id number]  Arrival date & time 12/11/12  1202   First MD Initiated Contact with Patient 12/11/12 1220      Chief Complaint  Patient presents with  . Headache    (Consider location/radiation/quality/duration/timing/severity/associated sxs/prior treatment) Patient is a 72 y.o. female presenting with headaches. The history is provided by the patient and the spouse. No language interpreter was used.  Headache Pain location:  R temporal and L temporal Quality:  Dull Radiates to:  Does not radiate Severity currently:  3/10 Severity at highest:  6/10 Onset quality:  Gradual Timing:  Intermittent Progression:  Unchanged Similar to prior headaches: yes   Relieved by:  Acetaminophen Associated symptoms: nausea and vomiting   Associated symptoms: no abdominal pain, no pain, no fever, no neck pain, no neck stiffness, no numbness and no photophobia   71yo female with c/o poor vision,  temporal headaches and shuffling gait x 2 days with vomiting.  Patient was sent here from [facility] for stroke work up.  She goes to [facility] post stroke 10/13.  Most of her stroke symptoms from October have resolved until 2 days ago. She had a intercerebral bleed per husband in October.   Patient does have memory problems and vision problems at baseline.  Husband thinks the L peripheral vision is worse along with the unsteady gait and confusion. pmh RI, cataracts, diabetes, incontinence, CVA, hypertension.   Past Medical History  Diagnosis Date  . Diabetes mellitus   . GERD (gastroesophageal reflux disease)   . Seizures   . Cataract   . Anxiety   . Stroke     History reviewed. No pertinent past surgical history.  Family History  Problem Relation Age of Onset  . Cancer Mother   . Cancer Father     History  Substance Use Topics  . Smoking status: Never Smoker   . Smokeless tobacco: Never Used  . Alcohol Use: No    OB History   Grav Para Term Preterm  Abortions TAB SAB Ect Mult Living                  Review of Systems  Constitutional: Negative.  Negative for fever.  HENT: Negative for trouble swallowing, neck pain and neck stiffness.   Eyes: Positive for visual disturbance. Negative for photophobia and pain.  Respiratory: Negative.   Cardiovascular: Negative.  Negative for chest pain.  Gastrointestinal: Positive for nausea and vomiting. Negative for abdominal pain.  Neurological: Positive for headaches. Negative for weakness, light-headedness and numbness.  Psychiatric/Behavioral: Negative.   All other systems reviewed and are negative.    Allergies  Barbiturates  Home Medications   Current Outpatient Rx  Name  Route  Sig  Dispense  Refill  . acetaminophen (TYLENOL) 325 MG tablet   Oral   Take 650 mg by mouth every 6 (six) hours as needed for pain.         Marland Kitchen insulin detemir (LEVEMIR) 100 UNIT/ML injection   Subcutaneous   Inject 6 Units into the skin daily with breakfast.          . levETIRAcetam (KEPPRA) 250 MG tablet   Oral   Take 250 mg by mouth every 12 (twelve) hours.         . metFORMIN (GLUCOPHAGE) 500 MG tablet   Oral   Take 1 tablet by mouth Twice daily with meals.         . metoprolol tartrate (LOPRESSOR) 25 MG tablet  Oral   Take 25 mg by mouth 2 (two) times daily.         . Multiple Vitamin (MULTIVITAMIN WITH MINERALS) TABS   Oral   Take 1 tablet by mouth daily.         Marland Kitchen omeprazole (PRILOSEC) 40 MG capsule   Oral   Take 40 mg by mouth daily.         . sitaGLIPtin (JANUVIA) 25 MG tablet   Oral   Take 50 mg by mouth daily.         . vitamin C (ASCORBIC ACID) 500 MG tablet   Oral   Take 500 mg by mouth daily.           BP 132/83  Pulse 85  Temp(Src) 99.3 F (37.4 C) (Oral)  SpO2 97%  Physical Exam  Nursing note and vitals reviewed. Constitutional: She is oriented to person, place, and time. She appears well-developed and well-nourished.  HENT:  Head:  Normocephalic and atraumatic.  Eyes: Conjunctivae and EOM are normal. Pupils are equal, round, and reactive to light.  Neck: Normal range of motion. Neck supple.  Cardiovascular: Normal rate.   Pulmonary/Chest: Effort normal and breath sounds normal.  Abdominal: Soft. Bowel sounds are normal. She exhibits no distension.  Musculoskeletal: Normal range of motion. She exhibits no edema and no tenderness.  Neurological: She is alert and oriented to person, place, and time. She has normal reflexes.  A & O x3  MAE =. Poor L visual field.  Coordination in tact except L visual field due to poor vision. + reflexes  Skin: Skin is warm and dry.  Psychiatric: She has a normal mood and affect.    ED Course  Procedures (including critical care time)    Patient has no shuffle gait in the ER today she does have left visual field that is poor but undetermined if this is back to baseline or not.  Neuritis intact otherwise. Headache has resolved.  This is a shared visit with Dr. Shyrl Numbers.     Labs Reviewed  GLUCOSE, CAPILLARY - Abnormal; Notable for the following:    Glucose-Capillary 191 (*)    All other components within normal limits  POCT I-STAT, CHEM 8 - Abnormal; Notable for the following:    BUN 30 (*)    Creatinine, Ser 1.30 (*)    Glucose, Bld 197 (*)    Calcium, Ion 0.92 (*)    All other components within normal limits  TROPONIN I  TROPONIN I  TROPONIN I  URINALYSIS, ROUTINE W REFLEX MICROSCOPIC  CBC WITH DIFFERENTIAL   Dg Chest Port 1 View  12/11/2012  *RADIOLOGY REPORT*  Clinical Data: Possible stroke, history diabetes  PORTABLE CHEST - 1 VIEW  Comparison: Chest x-ray of 09/05/2012  Findings: The lungs are clear but hyperaerated consistent with emphysema.  Mediastinal contours are stable.  The heart is within upper limits of normal.  No acute skeletal abnormality is seen.  IMPRESSION: Hyperaeration consistent with emphysema.  No active lung disease.   Original Report Authenticated  By: Dwyane Dee, M.D.      No diagnosis found.    MDM  I believe this is a TIA with resolving symptoms in the ER. Shuffle gait, headache, decreased vision, and confusion x2 days prior to arrival. She does have renal  insufficiency today and received a 500 cc bolus in the ER. CT of the head shows no bleed today. Will have this patient admitted for TIA symptoms. Patient will be admitted  by family practice medicine and Dr. Leveda Anna.    Date: 12/11/2012  Rate: 81  Rhythm: normal sinus rhythm  QRS Axis: normal  Intervals: normal  ST/T Wave abnormalities: normal  Conduction Disutrbances:none  Narrative Interpretation:   Old EKG Reviewed: unchanged   Labs Reviewed  GLUCOSE, CAPILLARY - Abnormal; Notable for the following:    Glucose-Capillary 191 (*)    All other components within normal limits  POCT I-STAT, CHEM 8 - Abnormal; Notable for the following:    BUN 30 (*)    Creatinine, Ser 1.30 (*)    Glucose, Bld 197 (*)    Calcium, Ion 0.92 (*)    All other components within normal limits  TROPONIN I  CBC WITH DIFFERENTIAL  TROPONIN I  TROPONIN I  URINALYSIS, ROUTINE W REFLEX MICROSCOPIC           Remi Haggard, NP 12/11/12 1639

## 2012-12-11 NOTE — Patient Instructions (Addendum)
Take patient to the [facility] emergency room.  Tell them that she had a recent stroke with a bleed into her head 4 months ago, and has developed symptoms 2 days ago and we're sending her for assessment. We have called the emergency room and spoken to the charge nurse and asked them not to leave her sitting in the lobby.  It is my impression that she needs to have labs, then scan, and then communicate with her neurologist to see where to go from there.

## 2012-12-11 NOTE — ED Provider Notes (Signed)
Medical screening examination/treatment/procedure(s) were performed by non-physician practitioner and as supervising physician I was immediately available for consultation/collaboration.  Derwood Kaplan, MD 12/11/12 1801

## 2012-12-11 NOTE — ED Notes (Signed)
MR brain clicked off in error

## 2012-12-11 NOTE — Consult Note (Signed)
Reason for Consult: concern for CVA Referring Physician: Leveda Anna, W  CC: headache  History is obtained from:patient   HPI: [name] [name] is a 72 y.o. 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 Today, she had a headache and her gait seemed more unsteady. She states that she has had a sore throat for a few days, had a GI bug last week with vomiting, and has had headaches for the past couple of days. She has not noticed any fever. She states that it was the headache which prompted her to come to the ER, but now feels almost back to her baseline and has no headache.   ROS: A 14 point ROS was performed and is negative except as noted in the HPI.  Past Medical History  Diagnosis Date  . Diabetes mellitus   . GERD (gastroesophageal reflux disease)   . Seizures   . Cataract   . Anxiety   . Stroke     Family History: Cancer in both parents  Social History: Never smoker  Exam: Current vital signs: BP 138/67  Pulse 112  Temp(Src) 100 F (37.8 C) (Oral)  Resp 20  SpO2 98% Vital signs in last 24 hours: Temp:  [99.2 F (37.3 C)-100.2 F (37.9 C)] 100 F (37.8 C) (02/08 2221) Pulse Rate:  [84-112] 112 (02/08 2221) Resp:  [15-20] 20 (02/08 2221) BP: (132-159)/(63-109) 138/67 mmHg (02/08 2221) SpO2:  [96 %-100 %] 98 % (02/08 2221)  General: in bed, NAD CV: RRR Mental Status: Patient is awake, alert, follows commands and answers questions immediately.  Immediate and remote memory are intact. Patient is able to give a clear and coherent history. She has no signs of aphasia, she does seem to neglect the left side some.  Cranial Nerves: II: Visual Fields are notable for a left hemianopia. Pupils are equal, round, and reactive to light.  Discs are difficult to visualize.  III,IV, VI: Some mild difficulty looking to left.  V: Facial sensation  is diminished on left VII: Facial movement is symmetric.  VIII: hearing is intact to voice X: Uvula elevates symmetrically XI: Shoulder shrug is symmetric. XII: tongue is midline  Motor: Tone is normal. Bulk is normal. 5/5 strength was present in all four extremities when prompted, however she is slow to move and seems to have difficulty coordinating her movements.  Sensory: Sensation is diminished throughout left side.  Deep Tendon Reflexes: 2+ on the right, 3+ on the left at the patella, 2+ and symmetric in the biceps.  Cerebellar: FNF with marked intention tremor on right, does not perform well due to difficulty initiating movements on left.  Gait: Not tested due to patient safety concerns.   I have reviewed labs in epic and the results pertinent to this consultation are: Mildly elevated cr, unremarkable CBC  I have reviewed the images obtained: CT head - no acute findings, previous hemorrhage not seen.    Impression: 72 yo F with worsening of old deficits. I suspect that this does not represent a new finding, but rather worsening of deficits in the setting of a physiological stressor(? Viral syndrome given sore throat, headache, nausea). At this time, I would recommend repeating her MRI, but if negative, then would not pursue further workup.   Recommendations: 1) Agree with MRI brain 2) If positive, please call and we will provide further recommendations. Otherwise, I feel  that no further neurological workup is needed. Would continue with PT,OT.   Ritta Slot, MD [facility] [phone]  If 7pm- 7am, please page neurology on call at [phone].

## 2012-12-11 NOTE — H&P (Signed)
Family Medicine Teaching [facility] Admission History and Physical Service Pager: [phone]  Patient name: [name] [name] Medical record number: [id number] Date of birth: 1941/04/29 Age: 72 y.o. Gender: female  Primary Care Provider: Aida Puffer, MD  Chief Complaint: "Headache"  Assessment and Plan: [name] [name] is a 72 y.o. year old female with a history of previous hemorrhagic stroke, type II DM, HTN, CKD, and seizure disorder presenting with 2 day history of unsteady gait, worsening left sided vision, and headache.   1. TIA vs CVA: patient with history of previous hemorrhagic CVA and now with unsteady gait, worsening left sided visual deficit, HA, and confusion for 2 days.  Given length of symptoms this is potential another CVA. Per ED PA verbal report of husband giving history that patient appeared back at baseline upon arrival to ED could be related to TIA. Patient with EKG with NSR so CVA less likely related to embolism from afib. -will admit to telemetry bed under FPTS -will risk stratify with A1c and lipid panel -will order MRI/MRA, echo, and carotid dopplers -neurology consulted by ED PA-will await their recs prior to starting any anti-platelet therapies given previous hemorrhagic CVA -q2 hr neuro checks x12 hr then every 4 hours -stroke swallow screen. Appears was on dysphagia 3 at last discharge from rehab-unclear current needs.  -out of bed with assistance, fall precautions -PT/OT/SLP evaluate and treat  2. History of seizure: related to previous stroke -will continue home keppra 250 mg q12 hr  3. DM, type II: -continue home levemir 6 u qam -SSI sensitive -hold home metformin and januvia while in hospital  4. CKD: per chart is stage 3. Cr 1.3, baseline appears to be around 1.14. Echo within a year without systolic CHF>  -will hydrate with NS @ 100 mL/hr -will monitor Cr for worsening renal function  5. GERD: will treat with protonix while in hospital  FEN/GI: NPO  until passes swallow screen, NS @ 100 mL/hr  Prophylaxis: SCDs, zofran, protonix Disposition: admit to telemetry Code Status: full   History of Present Illness: [name] [name] is a 72 y.o. year old female with a history of previous hemorrhagic stroke, type II DM, HTN, CKD, and seizure disorder presenting with 2 day history of unsteady gait, worsening left sided vision, HA and confusion. Patient gave history and not the most reliable historian due to some memory difficulties without official dementia diagnosed yet. States came to urgent care for headache of 2-3 days duration. Not had anything like this before. Was 4/10 now 1/10 with tylenol. Described as shooting pain between temples. Denies other symptoms. When prompted states always has trouble with vision on left side and per ED PA husband states it was worse than usual. States feels weak all over, not in any particular limb. Additionally complains of some issues swallowing and voice cracking. Occasionally feels like foods are getting stuck. Complains of chills. Denies fever, cough, congestion, chest pain. Patients husband was not available for questioning once we made it to the ED.  In the ED the patient had a CT head that reveals no acute intracranial findings, negative troponins x3. Per ED PA patient was seen at [facility] urgent care and noted to have shuffling gate, confusion and left sided visual deficit. So was sent to the ED for evaluation. Upon arrival gait issues had resolved and patients vision was back to baseline.  Patient Active Problem List  Diagnosis  . GERD (gastroesophageal reflux disease)  . DM type 2 (diabetes mellitus, type 2)  . Hypoglycemia  due to insulin  . Hemorrhage of brain, nontraumatic  . Dysphagia  . HTN (hypertension)  . CKD (chronic kidney disease) stage 3, GFR 30-59 ml/min  . Leukocytosis  . Hemorrhage in the brain-13 x 22 x 14 mm right posterior temporo-occipital intra-axial acute  . Left hip pain  . Left hemiparesis   . Hemianopia, homonymous, left  . Hemisensory deficit, left  . Nausea  . Headache  . Encephalopathy acute  . Acute respiratory failure with hypoxia  . Seizure disorder, grand mal  . Seizure  . Syncope  . 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 Diabetes mellitus   . GERD (gastroesophageal reflux disease)   . Seizures   . Cataract   . Anxiety   . Stroke    Past Surgical History: History reviewed. No pertinent past surgical history. Social History: History  Substance Use Topics  . Smoking status: Never Smoker   . Smokeless tobacco: Never Used  . Alcohol Use: No  Lives with husband and daughter. For any additional social history documentation, please refer to relevant sections of EMR.  Family History: Family History  Problem Relation Age of Onset  . Cancer Mother   . Cancer Father   colon and breast in mom Allergies: Allergies  Allergen Reactions  . Barbiturates     Becomes restless.  Also with all "strong" medications like sleeping pills   No current facility-administered medications on file prior to encounter.   Current Outpatient Prescriptions on File Prior to Encounter  Medication Sig Dispense Refill  . insulin detemir (LEVEMIR) 100 UNIT/ML injection Inject 6 Units into the skin daily with breakfast.       . metFORMIN (GLUCOPHAGE) 500 MG tablet Take 1 tablet by mouth Twice daily with meals.      . Multiple Vitamin (MULTIVITAMIN WITH MINERALS) TABS Take 1 tablet by mouth daily.      Marland Kitchen omeprazole (PRILOSEC) 40 MG capsule Take 40 mg by mouth daily.      . sitaGLIPtin (JANUVIA) 25 MG tablet Take 50 mg by mouth daily.      . vitamin C (ASCORBIC ACID) 500 MG tablet Take 500 mg by mouth daily.       Review Of Systems: Per HPI with the following additions: none Otherwise 12 point review of systems was performed and was unremarkable.  Physical Exam: BP 150/63  Pulse 85  Temp(Src) 99.3  F (37.4 C) (Oral)  Resp 15  SpO2 97% Exam: General: no acute distress, laying comfortably in bed on right side HEENT: MMM, PERRL, ear canals with lots of wax, TMs not visualized, cataracts apparent Cardiovascular: rrr, no mrg, 2+ distal pulses Respiratory: CTAB, no wheezes or crackles Abdomen: S, NT, ND, +BS Extremities: no edema, no cyanosis Neuro: 5/5 strength throughout, appeared to neglect left visual field and had intermittent neglect of left side, sensation to light touch intact, extraoccular muscled difficult to evaluate given patient had no left visual field vision and wouldn't track past midline, CN 5, 7, 8, 9, 10, 11, 12, difficult to evaluate CN2 given cataracts and unknown baseline  Labs and Imaging: CBC BMET   Recent Labs Lab 12/11/12 1354  WBC 10.3  HGB 12.8  HCT 38.1  PLT 195    Recent Labs Lab 12/11/12 1245  NA 137  K 4.9  CL 102  BUN 30*  CREATININE 1.30*  GLUCOSE 197*    Ct Head Wo Contrast  12/11/2012  *  RADIOLOGY REPORT*  Clinical Data: Severe headache.  History of hemorrhagic stroke.  CT HEAD WITHOUT CONTRAST  Technique:  Contiguous axial images were obtained from the base of the skull through the vertex without contrast.  Comparison: Head CT 09/03/2012  Findings: No acute intracranial hemorrhage.  No focal mass lesion. No CT evidence of acute infarction.   No midline shift or mass effect.  No hydrocephalus.  Basilar cisterns are patent.  Mild periventricular and subcortical white matter hypodensities unchanged from prior. Paranasal sinuses and mastoid air cells are clear.  Orbits are normal.  IMPRESSION:  1.  No acute intracranial findings.  2.  Mild microvascular disease.   Original Report Authenticated By: Genevive Bi, M.D.    Dg Chest Port 1 View  12/11/2012  *RADIOLOGY REPORT*  Clinical Data: Possible stroke, history diabetes  PORTABLE CHEST - 1 VIEW  Comparison: Chest x-ray of 09/05/2012  Findings: The lungs are clear but hyperaerated consistent with  emphysema.  Mediastinal contours are stable.  The heart is within upper limits of normal.  No acute skeletal abnormality is seen.  IMPRESSION: Hyperaeration consistent with emphysema.  No active lung disease.   Original Report Authenticated By: Dwyane Dee, M.D.    EKG: NSR, no ST or T wave changes   Marikay Alar, MD 12/11/2012, 5:15 PM  Family Medicine Upper Level Addendum:   I have seen and examined the patient independently, discussed with Dr. Birdie Sons, fully reviewed the H+P and agree with it's contents with the additions as noted in blue text. My independent exam is below.    Tana Conch, MD, PGY2 12/11/2012 8:59 PM

## 2012-12-11 NOTE — ED Notes (Signed)
Pt to CT when rounding 

## 2012-12-11 NOTE — Progress Notes (Signed)
Subjective: 72 year old lady with a history of gait disturbance and shuffling for the past few days. She also has had a bad headache last 2 days, though it is improving a little bit today. She is diabetic, hypertensive, almost blind, has a history of chronic kidney disease, and other issues. However of most concern is the fact that she had an intracranial hemorrhage or 4 months ago as documented by CT and MRI. She apparently had a repeat MRI recently at the neurologist office, but that report is not seem to be available through Epic. After the episode from October she progressed in her general well this. He got to better control, where her sugars usually run about 150 in the morning and 200 later that day. Her shuffled gait had improved. She still has cataracts and needs to see an eye doctor and has not done so. She has been followed by the neurologist. On Thursday she had worsening headaches. She vomited a few times. She developed a shuffling gait. This persisted on Friday by her primary care office was closed Friday. Today they brought her back to the urgent care. Actually she and her husband feel like she is a little bit better today. She does complain of urinating frequently and having trouble holding her urine  Objective: Small stature female appearing older than her stated age of 3. She keeps her eyes closed. In general. Has had pupils dilated to about 4-5 millimeters bilaterally. The lenses are little hazy. Her throat is clear. Neck supple without nodes or thyromegaly. No carotid bruits were noted. Chest is clear to auscultation. Heart regular without murmurs gallops or arrhythmias. Abdomen soft and nontender. Grip is adequate and symmetrical. Patient is able to ambulate, but is very unsteady and has a shuffled to her gait.  Assessment: Headache Gait disturbance Vomiting History of intracranial bleed 4 months ago Diabetes Chronic kidney disease Cataracts and poor vision Urinary frequency and  intermittent incontinence  Plan: This is a subacute, being about 82 hours old, but very suspicious for the same kind of episode that she had back in October. I believe she needs acute assessment today, though it is not a high level emergency. She will need labs checked before doing a CT scan, so I'm sending her over to the emergency room. Her husband would rather take her in his vehicle, and I think that is reasonably safe since this was 48 hours since the onset. We will send her to the emergency room for evaluation, but will attempt to contact the emergency room physicians to communicate before hand.

## 2012-12-11 NOTE — ED Notes (Signed)
Pt sent here from [facility] for severe headache x 2-3 days. Hx of hemorrhagic stroke 4 months ago. Pt only complains of headache and recent cold symptoms, has confusion as residual from last stroke.

## 2012-12-12 ENCOUNTER — Inpatient Hospital Stay (HOSPITAL_COMMUNITY): Payer: Medicare Other | Admitting: Anesthesiology

## 2012-12-12 ENCOUNTER — Encounter (HOSPITAL_COMMUNITY)
Admission: EM | Disposition: A | Discharge status: Home / Self Care | Payer: Self-pay | Source: Home / Self Care | Attending: Family Medicine

## 2012-12-12 ENCOUNTER — Encounter (HOSPITAL_COMMUNITY): Payer: Self-pay | Admitting: Anesthesiology

## 2012-12-12 ENCOUNTER — Inpatient Hospital Stay (HOSPITAL_COMMUNITY): Payer: Medicare Other

## 2012-12-12 DIAGNOSIS — J381 Polyp of vocal cord and larynx: Secondary | ICD-10-CM

## 2012-12-12 DIAGNOSIS — G459 Transient cerebral ischemic attack, unspecified: Secondary | ICD-10-CM

## 2012-12-12 DIAGNOSIS — I69919 Unspecified symptoms and signs involving cognitive functions following unspecified cerebrovascular disease: Secondary | ICD-10-CM

## 2012-12-12 HISTORY — PX: DIRECT LARYNGOSCOPY: SHX5326

## 2012-12-12 LAB — GLUCOSE, CAPILLARY
Glucose-Capillary: 117 mg/dL — ABNORMAL HIGH (ref 70–99)
Glucose-Capillary: 137 mg/dL — ABNORMAL HIGH (ref 70–99)
Glucose-Capillary: 174 mg/dL — ABNORMAL HIGH (ref 70–99)

## 2012-12-12 LAB — HEMOGLOBIN A1C
Hgb A1c MFr Bld: 8.8 % — ABNORMAL HIGH (ref <5.7)
Mean Plasma Glucose: 206 mg/dL — ABNORMAL HIGH (ref <117)

## 2012-12-12 LAB — TROPONIN I: Troponin I: <0.3 ng/mL (ref <0.30)

## 2012-12-12 LAB — LIPID PANEL
LDL Cholesterol: 45 mg/dL (ref 0–99)
VLDL: 26 mg/dL (ref 0–40)

## 2012-12-12 SURGERY — LARYNGOSCOPY, DIRECT
Anesthesia: General | Site: Mouth | Laterality: Left | Wound class: Clean

## 2012-12-12 MED ORDER — SUCCINYLCHOLINE CHLORIDE 20 MG/ML IJ SOLN
INTRAMUSCULAR | Status: DC | PRN
  Administered 2012-12-12: 100 mg via INTRAVENOUS

## 2012-12-12 MED ORDER — DEXAMETHASONE SODIUM PHOSPHATE 4 MG/ML IJ SOLN
INTRAMUSCULAR | Status: DC | PRN
  Administered 2012-12-12: 4 mg via INTRAVENOUS

## 2012-12-12 MED ORDER — ARTIFICIAL TEARS OP OINT
TOPICAL_OINTMENT | OPHTHALMIC | Status: DC | PRN
  Administered 2012-12-12: 1 via OPHTHALMIC

## 2012-12-12 MED ORDER — LACTATED RINGERS IV SOLN
INTRAVENOUS | Status: DC | PRN
  Administered 2012-12-12 (×2): via INTRAVENOUS

## 2012-12-12 MED ORDER — ONDANSETRON HCL 4 MG/2ML IJ SOLN
INTRAMUSCULAR | Status: DC | PRN
  Administered 2012-12-12: 4 mg via INTRAVENOUS

## 2012-12-12 MED ORDER — PROPOFOL 10 MG/ML IV BOLUS
INTRAVENOUS | Status: DC | PRN
  Administered 2012-12-12: 180 mg via INTRAVENOUS

## 2012-12-12 MED ORDER — FENTANYL CITRATE 0.05 MG/ML IJ SOLN
INTRAMUSCULAR | Status: DC | PRN
  Administered 2012-12-12 (×2): 50 ug via INTRAVENOUS

## 2012-12-12 MED ORDER — METOCLOPRAMIDE HCL 5 MG/ML IJ SOLN
INTRAMUSCULAR | Status: DC | PRN
  Administered 2012-12-12: 10 mg via INTRAVENOUS

## 2012-12-12 MED ORDER — LIDOCAINE HCL (CARDIAC) 20 MG/ML IV SOLN
INTRAVENOUS | Status: DC | PRN
  Administered 2012-12-12: 100 mg via INTRAVENOUS

## 2012-12-12 SURGICAL SUPPLY — 19 items
CANISTER SUCTION 2500CC (MISCELLANEOUS) ×2 IMPLANT
CLOTH BEACON ORANGE TIMEOUT ST (SAFETY) ×2 IMPLANT
CONT SPEC 4OZ CLIKSEAL STRL BL (MISCELLANEOUS) ×1 IMPLANT
COVER TABLE BACK 60X90 (DRAPES) ×2 IMPLANT
DRAPE PROXIMA HALF (DRAPES) ×2 IMPLANT
GLOVE BIO SURGEON STRL SZ7.5 (GLOVE) ×2 IMPLANT
GOWN STRL NON-REIN LRG LVL3 (GOWN DISPOSABLE) ×4 IMPLANT
GUARD TEETH (MISCELLANEOUS) ×2 IMPLANT
KIT ROOM TURNOVER OR (KITS) ×2 IMPLANT
MARKER SKIN DUAL TIP RULER LAB (MISCELLANEOUS) ×1 IMPLANT
NS IRRIG 1000ML POUR BTL (IV SOLUTION) ×1 IMPLANT
PAD ARMBOARD 7.5X6 YLW CONV (MISCELLANEOUS) ×4 IMPLANT
PATTIES SURGICAL .5 X1 (DISPOSABLE) IMPLANT
SOLUTION ANTI FOG 6CC (MISCELLANEOUS) IMPLANT
SPONGE GAUZE 4X4 12PLY (GAUZE/BANDAGES/DRESSINGS) ×2 IMPLANT
SURGILUBE 2OZ TUBE FLIPTOP (MISCELLANEOUS) ×2 IMPLANT
TOWEL OR 17X24 6PK STRL BLUE (TOWEL DISPOSABLE) ×4 IMPLANT
TUBE CONNECTING 12X1/4 (SUCTIONS) ×2 IMPLANT
WATER STERILE IRR 1000ML POUR (IV SOLUTION) ×2 IMPLANT

## 2012-12-12 NOTE — Progress Notes (Signed)
VASCULAR LAB PRELIMINARY  PRELIMINARY  PRELIMINARY  PRELIMINARY  Carotid Dopplers completed.    Preliminary report:  There is no significant  ICA stenosis.  Vertebral artery flow is antegrade.  [name], RVT 12/12/2012, 11:52 AM

## 2012-12-12 NOTE — Anesthesia Preprocedure Evaluation (Addendum)
Anesthesia Evaluation  Patient identified by MRN, date of birth, ID band Patient awake    Reviewed: Allergy & Precautions, H&P , NPO status   History of Anesthesia Complications Negative for: history of anesthetic complications  Airway       Dental   Pulmonary neg pulmonary ROS,          Cardiovascular hypertension, Pt. on home beta blockers     Neuro/Psych  Headaches, Seizures -,  PSYCHIATRIC DISORDERS Anxiety CVA, Residual Symptoms    GI/Hepatic Neg liver ROS, GERD-  Medicated,  Endo/Other  diabetes, Insulin Dependent  Renal/GU Renal InsufficiencyRenal disease     Musculoskeletal negative musculoskeletal ROS (+)   Abdominal   Peds  Hematology negative hematology ROS (+)   Anesthesia Other Findings   Reproductive/Obstetrics                          Anesthesia Physical Anesthesia Plan  ASA: III  Anesthesia Plan: General   Post-op Pain Management:    Induction: Intravenous  Airway Management Planned: Oral ETT  Additional Equipment:   Intra-op Plan:   Post-operative Plan: Extubation in OR  Informed Consent:   Plan Discussed with: CRNA, Anesthesiologist and Surgeon  Anesthesia Plan Comments:         Anesthesia Quick Evaluation

## 2012-12-12 NOTE — Progress Notes (Signed)
PGY-2 Daily Progress Note Family Medicine Teaching Service Aldine Contes. Marti Sleigh, MD Service Pager: [phone]   Subjective: Patient thinks she wet the bed overnight + wants to change clothes (discussed with RN). No other complaints. Patient just worried that she has upset her husband as he wouldn't talk to her that much last night. I attempted to reach him this morning but he was not at home. Not complaining of headache  Objective:  Temp:  [98.7 F (37.1 C)-100.2 F (37.9 C)] 99.5 F (37.5 C) (02/09 0617) Pulse Rate:  [84-117] 117 (02/09 0617) Resp:  [15-20] 20 (02/09 0617) BP: (102-159)/(38-109) 129/67 mmHg (02/09 0617) SpO2:  [96 %-100 %] 97 % (02/09 0617) Weight:  [102 lb 8 oz (46.494 kg)] 102 lb 8 oz (46.494 kg) (02/09 0508)  Intake/Output Summary (Last 24 hours) at 12/12/12 0737 Last data filed at 12/12/12 0053  Gross per 24 hour  Intake    145 ml  Output      0 ml  Net    145 ml    General: no acute distress, laying comfortably in bed   HEENT: MMM Cardiovascular: rrr, no mrg, 2+ distal pulses  Respiratory: CTAB, no wheezes or crackles  Abdomen: S, NT, ND, +BS  Extremities: no edema, no cyanosis  Neuro: 5/5 strength throughout (for an elderly female), appeared to neglect left visual field and had intermittent neglect of left side (poor coordination and participation of exam on left hand with grip and release), sensation to light touch intact, extraoccular muscled difficult to evaluate given patient had no left visual field vision and wouldn't track past midline. CN 5, 7, 8, 9, 10, 11, 12, intact.    Labs and imaging:   CBC  Recent Labs Lab 12/11/12 1245 12/11/12 1354  WBC  --  10.3  HGB 14.3 12.8  HCT 42.0 38.1  PLT  --  195   BMET/CMET  Recent Labs Lab 12/11/12 1245  NA 137  K 4.9  CL 102  BUN 30*  CREATININE 1.30*  GLUCOSE 197*    Ct Head Wo Contrast  12/11/2012  *RADIOLOGY REPORT*  Clinical Data: Severe headache.  History of hemorrhagic stroke.  CT  HEAD WITHOUT CONTRAST  Technique:  Contiguous axial images were obtained from the base of the skull through the vertex without contrast.  Comparison: Head CT 09/03/2012  Findings: No acute intracranial hemorrhage.  No focal mass lesion. No CT evidence of acute infarction.   No midline shift or mass effect.  No hydrocephalus.  Basilar cisterns are patent.  Mild periventricular and subcortical white matter hypodensities unchanged from prior. Paranasal sinuses and mastoid air cells are clear.  Orbits are normal.  IMPRESSION:  1.  No acute intracranial findings.  2.  Mild microvascular disease.   Original Report Authenticated By: Genevive Bi, M.D.    Dg Chest Port 1 View  12/11/2012  *RADIOLOGY REPORT*  Clinical Data: Possible stroke, history diabetes  PORTABLE CHEST - 1 VIEW  Comparison: Chest x-ray of 09/05/2012  Findings: The lungs are clear but hyperaerated consistent with emphysema.  Mediastinal contours are stable.  The heart is within upper limits of normal.  No acute skeletal abnormality is seen.  IMPRESSION: Hyperaeration consistent with emphysema.  No active lung disease.   Original Report Authenticated By: Dwyane Dee, M.D.     Scheduled: . insulin aspart  0-5 Units Subcutaneous QHS  . insulin aspart  0-9 Units Subcutaneous TID WC  . insulin detemir  6 Units Subcutaneous Q breakfast  .  levETIRAcetam  250 mg Oral Q12H  . metoprolol tartrate  25 mg Oral BID  . multivitamin with minerals  1 tablet Oral Daily  . pantoprazole  40 mg Oral Daily  . vitamin C  500 mg Oral Daily   Continuous: . sodium chloride 100 mL/hr at 12/12/12 0700   ZOX:[id number], acetaminophen, acetaminophen, acetaminophen, ondansetron (ZOFRAN) IV, ondansetron (ZOFRAN) IV, ondansetron, senna-docusate  Assessment  Noelly Lasseigne is a 72 y.o. year old female with a history of previous hemorrhagic stroke, type II DM, HTN, CKD, and seizure disorder presenting with 2 day history of unsteady gait, worsening left sided vision,  and headache.   1. TIA vs CVA: patient with history of previous hemorrhagic CVA and now with ? unsteady gait, ?worsening left sided visual deficit, HA, and confusion for 2 days. Given length of symptoms this is potential another CVA.  -Neuro consulted and believe this may be exacerbation of baseline symptoms due to physiologic stressor (this is consistent with low grade temp to 100.2, sore throat. Per husband, back to baseline per ED PA report. Patient with EKG with NSR so CVA less likely related to embolism from afib. On tele some sinus tachycardia but no a fib. If tachy persists (patient may just be agitated when awakened), may need repeat EKG   -Neuro recommends MRI only then will give further recs based on results   -no ASA due to history of hemorrhagic CVA  -risk stratify with A1c (pending) and lipid panel (LDL 45) -SLP eval, NPO for now. Patient was on dysphagia 3 at last discharge from rehab -with overall weakness, may need rehab or SNF-PT/OT/SLP evaluate and treat  -out of bed with assistance, fall precautions   2. History of seizure: related to previous stroke  -will continue home keppra 250 mg q12 hr   3. DM, type II:  -continue home levemir 6 u qam  -SSI sensitive  -hold home metformin and januvia while in hospital   4. CKD: per chart is stage 3. Cr 1.3, baseline appears to be around 1.14. Echo within a year without systolic CHF>  -will hydrate with NS @ 100 mL/hr. Repeat BMET AM tomorrow.   5. GERD: will treat with protonix while in hospital   FEN/GI: NPO until passes swallow screen, NS @ 100 mL/hr  Prophylaxis: SCDs, zofran, protonix  Disposition: telemetry. Expect patient likely 1-2 more day stay for further workup and eval by PT/OT/SLP Code Status: full   Tana Conch, MD PGY2, Family Medicine Teaching Service [phone]

## 2012-12-12 NOTE — Anesthesia Procedure Notes (Addendum)
Procedure Name: Intubation Date/Time: 12/12/2012 6:30 PM Performed by: Wray Kearns A Pre-anesthesia Checklist: Patient identified, Timeout performed, Emergency Drugs available, Suction available and Patient being monitored Patient Re-evaluated:Patient Re-evaluated prior to inductionOxygen Delivery Method: Circle system utilized Preoxygenation: Pre-oxygenation with 100% oxygen Intubation Type: IV induction and Cricoid Pressure applied Ventilation: Mask ventilation without difficulty Laryngoscope Size: Miller and 3 Grade View: Grade I Tube type: Oral Tube size: 7.0 mm Number of attempts: 1 Airway Equipment and Method: Stylet,  Bite block and LTA kit utilized Placement Confirmation: ETT inserted through vocal cords under direct vision,  breath sounds checked- equal and bilateral and positive ETCO2 Secured at: 21 cm Tube secured with: Tape Dental Injury: Teeth and Oropharynx as per pre-operative assessment  Comments: Intubation per Dr. Suszanne Conners.

## 2012-12-12 NOTE — Procedures (Signed)
Objective Swallowing Evaluation: Modified Barium Swallowing Study  Patient Details  Name: [name] [name] MRN: [id number] Date of Birth: 04-30-1941  Today's Date: 12/12/2012 Time: 4098-1191 SLP Time Calculation (min): 60 min  Past Medical History:  Past Medical History  Diagnosis Date  . Diabetes mellitus   . GERD (gastroesophageal reflux disease)   . Seizures   . Cataract   . Anxiety   . Stroke    Past Surgical History: History reviewed. No pertinent past surgical history. HPI:  TIA vs CVA: patient with history of previous hemorrhagic CVA and now with unsteady gait, worsening left sided visual deficit, HA, and confusion for 2 days.  Given length of symptoms this is potential another CVA. Per ED PA verbal report of husband giving history that patient appeared back at baseline upon arrival to ED could be related to TIA. Patient with EKG with NSR so CVA less likely related to embolism from afib. Patient referred for BSE per stroke protocol.  MBS indicated to assess risk for aspiration and recommend safest PO diet possible.    Assessment / Plan / Recommendation Clinical Impression  Dysphagia Diagnosis: Moderate oral phase dysphagia;Moderate pharyngeal phase dysphagia;Moderate cervical esophageal phase dysphagia Clinical impression: Moderate sensory motor oral  dysphagia affected by reduced lingual strength and coordination.  Poor oral awareness especially on left with slow oral transit.  Pharyngeal phase multifactorial.  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  Epiglottic deflection evident but not complete tilt due to curvature which affects complete hyoid laryngeal elevation.  Reduced relaxation of CP muscle resulting in moderate amount of barium containment in pyriforms s/p swallow of all liquid presentations.  Trace slient aspiration during swallow of thin and nectar thick liquids by spoon, cup, and straw from residuals spilling into airway from  pyriforms.  Instructed cough cleared aspirated material. Severe diffuse residue with mechanical soft solids specifically posterior pharyngeal wall.  Brief esophageal sweep revealed some backflow of puree and thin liquid in cervical portion of esophagus with noted slow bolus transit.  Foreign object observed in airway at level of cords.  Radiologist called into suite to observe directly and confirmed.  Object noted to move upon patient coughing.  Patient noted to become dysphonic.  Results of study indicate patient appropriate for dysphagia 1 diet consistency and thin liquids with strict aspiration precautions.  Defer initiating PO's until  s/p ENT consult.  ST contacted RN with results of evaluation.  ST to follow in acute care setting for POC.  BSE may have to be repeated  s/p ENT consult.     Treatment Recommendation       Diet Recommendation Dysphagia 1 (Puree);Thin liquid   Liquid Administration via: Cup;No straw Medication Administration: Crushed with puree Compensations: Slow rate;Small sips/bites;Clear throat intermittently;Hard cough after swallow;Multiple dry swallows after each bite/sip Postural Changes and/or Swallow Maneuvers: Out of bed for meals;Seated upright 90 degrees;Upright 30-60 min after meal    Other  Recommendations Recommended Consults: Consider ENT evaluation Oral Care Recommendations: Oral care QID   Follow Up Recommendations  Outpatient SLP    Frequency and Duration min 2x/week  2 weeks       SLP Swallow Goals Patient will consume recommended diet without observed clinical signs of aspiration with: Maximum assistance Patient will utilize recommended strategies during swallow to increase swallowing safety with: Maximum assistance   General Date of Onset: 12/11/12 HPI: TIA vs CVA: patient with history of previous hemorrhagic CVA and now with unsteady gait, worsening left sided visual deficit,  HA, and confusion for 2 days.  Given length of symptoms this is  potential another CVA. Per ED PA verbal report of husband giving history that patient appeared back at baseline upon arrival to ED could be related to TIA. Patient with EKG with NSR so CVA less likely related to embolism from afib. Type of Study: Modified Barium Swallowing Study Reason for Referral: Objectively evaluate swallowing function Previous Swallow Assessment: MBS 08/25/13 and 09/01/12 Diet Prior to this Study: NPO Temperature Spikes Noted: No Respiratory Status: Room air History of Recent Intubation: No Behavior/Cognition: Confused;Pleasant mood;Cooperative;Requires cueing;Lethargic Oral Cavity - Dentition: Adequate natural dentition Oral Motor / Sensory Function: Impaired - see Bedside swallow eval Self-Feeding Abilities: Total assist Patient Positioning: Upright in chair Baseline Vocal Quality: Hoarse Volitional Cough: Strong Volitional Swallow: Able to elicit Anatomy: Within functional limits Pharyngeal Secretions: Not observed secondary MBS    Reason for Referral Objectively evaluate swallowing function   Oral Phase Oral Preparation/Oral Phase Oral Phase: Impaired Oral - Solids Oral - Puree: Lingual pumping;Incomplete tongue to palate contact;Reduced posterior propulsion;Holding of bolus;Weak lingual manipulation;Lingual/palatal residue;Piecemeal swallowing;Decreased velopharyngeal closure;Delayed oral transit Oral - Mechanical Soft: Impaired mastication;Weak lingual manipulation;Lingual pumping;Incomplete tongue to palate contact;Reduced posterior propulsion;Holding of bolus;Delayed oral transit;Decreased velopharyngeal closure;Piecemeal swallowing;Lingual/palatal residue Oral - Pill: Lingual pumping;Incomplete tongue to palate contact;Reduced posterior propulsion;Delayed oral transit;Decreased velopharyngeal closure   Pharyngeal Phase Pharyngeal Phase Pharyngeal Phase: Impaired Pharyngeal - Nectar Pharyngeal - Nectar Teaspoon: Reduced pharyngeal peristalsis;Reduced  anterior laryngeal mobility;Reduced laryngeal elevation;Reduced airway/laryngeal closure;Reduced tongue base retraction;Penetration/Aspiration during swallow;Pharyngeal residue - pyriform sinuses;Pharyngeal residue - cp segment Penetration/Aspiration details (nectar teaspoon): Material enters airway, remains ABOVE vocal cords and not ejected out Pharyngeal - Nectar Cup: Premature spillage to pyriform sinuses;Premature spillage to valleculae;Reduced pharyngeal peristalsis;Reduced laryngeal elevation;Reduced airway/laryngeal closure;Reduced tongue base retraction;Penetration/Aspiration during swallow;Pharyngeal residue - valleculae;Pharyngeal residue - pyriform sinuses;Pharyngeal residue - posterior pharnyx;Pharyngeal residue - cp segment;Trace aspiration Penetration/Aspiration details (nectar cup): Material enters airway, remains ABOVE vocal cords and not ejected out Pharyngeal - Nectar Straw: Premature spillage to pyriform sinuses;Reduced pharyngeal peristalsis;Reduced anterior laryngeal mobility;Reduced laryngeal elevation;Reduced tongue base retraction;Reduced airway/laryngeal closure;Penetration/Aspiration during swallow;Pharyngeal residue - pyriform sinuses;Pharyngeal residue - cp segment Penetration/Aspiration details (nectar straw): Material enters airway, remains ABOVE vocal cords and not ejected out Pharyngeal - Thin Pharyngeal - Thin Teaspoon: Premature spillage to valleculae;Reduced pharyngeal peristalsis;Reduced anterior laryngeal mobility;Reduced laryngeal elevation;Reduced airway/laryngeal closure;Reduced tongue base retraction;Penetration/Aspiration during swallow;Trace aspiration;Pharyngeal residue - pyriform sinuses;Pharyngeal residue - cp segment Penetration/Aspiration details (thin teaspoon): Material enters airway, remains ABOVE vocal cords and not ejected out Pharyngeal - Thin Cup: Reduced laryngeal elevation;Reduced airway/laryngeal closure;Reduced pharyngeal peristalsis;Reduced  anterior laryngeal mobility;Reduced tongue base retraction;Penetration/Aspiration during swallow;Trace aspiration;Pharyngeal residue - cp segment;Pharyngeal residue - pyriform sinuses Penetration/Aspiration details (thin cup): Material enters airway, remains ABOVE vocal cords and not ejected out Pharyngeal - Thin Straw: Reduced laryngeal elevation;Premature spillage to pyriform sinuses;Reduced pharyngeal peristalsis;Reduced anterior laryngeal mobility;Reduced airway/laryngeal closure;Reduced tongue base retraction;Penetration/Aspiration before swallow;Penetration/Aspiration during swallow;Trace aspiration Penetration/Aspiration details (thin straw): Material enters airway, remains ABOVE vocal cords and not ejected out Pharyngeal - Solids Pharyngeal - Mechanical Soft: Reduced pharyngeal peristalsis;Reduced anterior laryngeal mobility;Reduced laryngeal elevation;Reduced airway/laryngeal closure;Reduced tongue base retraction;Pharyngeal residue - cp segment;Pharyngeal residue - pyriform sinuses;Pharyngeal residue - posterior pharnyx Pharyngeal - Pill: Reduced pharyngeal peristalsis;Reduced anterior laryngeal mobility;Reduced laryngeal elevation;Reduced airway/laryngeal closure;Reduced tongue base retraction  Cervical Esophageal Phase    GO    Cervical Esophageal Phase Cervical Esophageal Phase: Impaired Cervical Esophageal Phase - Thin Thin Teaspoon: Esophageal backflow into cervical esophagus;Reduced cricopharyngeal relaxation Cervical  Esophageal Phase - Solids Puree: Reduced cricopharyngeal relaxation;Esophageal backflow into cervical esophagus Cervical Esophageal Phase - Comment Cervical Esophageal Comment: slow bolus transit         Moreen Fowler MS, CCC-SLP [phone] [facility] 12/12/2012, 4:36 PM

## 2012-12-12 NOTE — Anesthesia Postprocedure Evaluation (Signed)
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

## 2012-12-12 NOTE — Brief Op Note (Signed)
12/11/2012 - 12/12/2012  6:47 PM  PATIENT:  Georgiann Cocker  72 y.o. female  PRE-OPERATIVE DIAGNOSIS: Left vocal cord mass  POST-OPERATIVE DIAGNOSIS:  left vocal cord mass  PROCEDURE:  Procedure(s): DIRECT LARYNGOSCOPY with excision of laryngeal mass (Left)  SURGEON:  Surgeon(s) and Role:    * Sui W Ahmeer Tuman, MD - Primary  PHYSICIAN ASSISTANT:   ASSISTANTS: none   ANESTHESIA:   general  EBL:     BLOOD ADMINISTERED:none  DRAINS: none   LOCAL MEDICATIONS USED:  NONE  SPECIMEN:  Source of Specimen:  Left laryngeal mass  DISPOSITION OF SPECIMEN:  PATHOLOGY  COUNTS:  YES  TOURNIQUET:  * No tourniquets in log *  DICTATION: .Other Dictation: Dictation Number [id number]  PLAN OF CARE: Admit to inpatient   PATIENT DISPOSITION:  PACU - hemodynamically stable.   Delay start of Pharmacological VTE agent (>24hrs) due to surgical blood loss or risk of bleeding: no

## 2012-12-12 NOTE — Consult Note (Signed)
Reason for Consult: Hoarseness, possible pharyngeal or laryngeal foreign body Referring Physician: Tana Conch, MD  HPI:  [name] [name] is an 72 y.o. female was originally admitted yesterday for evaluation of possible CVA. She has a history of previous hemorrhagic CVA. At the time of admission, she was complaining of unsteady gait, worsening of left-sided visual deficit, headache, dysphagia, and confusion for 2 days. In addition, the patient also complains of increasing hoarseness over the past 2 weeks. The patient was also evaluated by a speech language pathologist with a barium swallow study. Based on the radiographic study, there was a concern of possible foreign body within her glottic opening. ENT was therefore consulted for further evaluation and treatment.  Past Medical History  Diagnosis Date  . Diabetes mellitus   . GERD (gastroesophageal reflux disease)   . Seizures   . Cataract   . Anxiety   . Stroke     History reviewed. No pertinent past surgical history.  Family History  Problem Relation Age of Onset  . Cancer Mother   . Cancer Father     Social History:  reports that she has never smoked. She has never used smokeless tobacco. She reports that she does not drink alcohol or use illicit drugs.  Allergies:  Allergies  Allergen Reactions  . Barbiturates     Becomes restless.  Also with all "strong" medications like sleeping pills    Medications:  I have reviewed the patient's current medications. Scheduled: . insulin aspart  0-5 Units Subcutaneous QHS  . insulin aspart  0-9 Units Subcutaneous TID WC  . insulin detemir  6 Units Subcutaneous Q breakfast  . levETIRAcetam  250 mg Oral Q12H  . metoprolol tartrate  25 mg Oral BID  . multivitamin with minerals  1 tablet Oral Daily  . pantoprazole  40 mg Oral Daily  . vitamin C  500 mg Oral Daily   NWG:[id number], acetaminophen, acetaminophen, acetaminophen, ondansetron (ZOFRAN) IV, ondansetron (ZOFRAN) IV,  ondansetron, senna-docusate  Results for orders placed during the hospital encounter of 12/11/12 (from the past 48 hour(s))  GLUCOSE, CAPILLARY     Status: Abnormal   Collection Time    12/11/12 12:09 PM      Result Value Range   Glucose-Capillary 191 (*) 70 - 99 mg/dL  TROPONIN I     Status: None   Collection Time    12/11/12 12:31 PM      Result Value Range   Troponin I <0.30  <0.30 ng/mL   Comment:            Due to the release kinetics of cTnI,     a negative result within the first hours     of the onset of symptoms does not rule out     myocardial infarction with certainty.     If myocardial infarction is still suspected,     repeat the test at appropriate intervals.  POCT I-STAT, CHEM 8     Status: Abnormal   Collection Time    12/11/12 12:45 PM      Result Value Range   Sodium 137  135 - 145 mEq/L   Potassium 4.9  3.5 - 5.1 mEq/L   Chloride 102  96 - 112 mEq/L   BUN 30 (*) 6 - 23 mg/dL   Creatinine, Ser 6.96 (*) 0.50 - 1.10 mg/dL   Glucose, Bld 295 (*) 70 - 99 mg/dL   Calcium, Ion 2.84 (*) 1.13 - 1.30 mmol/L   TCO2 22  0 -  100 mmol/L   Hemoglobin 14.3  12.0 - 15.0 g/dL   HCT 82.9  56.2 - 13.0 %  CBC WITH DIFFERENTIAL     Status: None   Collection Time    12/11/12  1:54 PM      Result Value Range   WBC 10.3  4.0 - 10.5 K/uL   RBC 4.09  3.87 - 5.11 MIL/uL   Hemoglobin 12.8  12.0 - 15.0 g/dL   HCT 86.5  78.4 - 69.6 %   MCV 93.2  78.0 - 100.0 fL   MCH 31.3  26.0 - 34.0 pg   MCHC 33.6  30.0 - 36.0 g/dL   RDW 29.5  28.4 - 13.2 %   Platelets 195  150 - 400 K/uL   Neutrophils Relative 64  43 - 77 %   Neutro Abs 6.6  1.7 - 7.7 K/uL   Lymphocytes Relative 26  12 - 46 %   Lymphs Abs 2.7  0.7 - 4.0 K/uL   Monocytes Relative 10  3 - 12 %   Monocytes Absolute 1.0  0.1 - 1.0 K/uL   Eosinophils Relative 0  0 - 5 %   Eosinophils Absolute 0.0  0.0 - 0.7 K/uL   Basophils Relative 0  0 - 1 %   Basophils Absolute 0.0  0.0 - 0.1 K/uL  TROPONIN I     Status: None    Collection Time    12/11/12  4:32 PM      Result Value Range   Troponin I <0.30  <0.30 ng/mL   Comment:            Due to the release kinetics of cTnI,     a negative result within the first hours     of the onset of symptoms does not rule out     myocardial infarction with certainty.     If myocardial infarction is still suspected,     repeat the test at appropriate intervals.  URINALYSIS, ROUTINE W REFLEX MICROSCOPIC     Status: None   Collection Time    12/11/12  6:45 PM      Result Value Range   Color, Urine YELLOW  YELLOW   APPearance CLEAR  CLEAR   Specific Gravity, Urine 1.014  1.005 - 1.030   pH 6.0  5.0 - 8.0   Glucose, UA NEGATIVE  NEGATIVE mg/dL   Hgb urine dipstick NEGATIVE  NEGATIVE   Bilirubin Urine NEGATIVE  NEGATIVE   Ketones, ur NEGATIVE  NEGATIVE mg/dL   Protein, ur NEGATIVE  NEGATIVE mg/dL   Urobilinogen, UA 0.2  0.0 - 1.0 mg/dL   Nitrite NEGATIVE  NEGATIVE   Leukocytes, UA NEGATIVE  NEGATIVE   Comment: MICROSCOPIC NOT DONE ON URINES WITH NEGATIVE PROTEIN, BLOOD, LEUKOCYTES, NITRITE, OR GLUCOSE <1000 mg/dL.  GLUCOSE, CAPILLARY     Status: Abnormal   Collection Time    12/11/12 10:20 PM      Result Value Range   Glucose-Capillary 111 (*) 70 - 99 mg/dL   Comment 1 Notify RN     Comment 2 Documented in Chart    LIPID PANEL     Status: None   Collection Time    12/12/12  1:41 AM      Result Value Range   Cholesterol 155  0 - 200 mg/dL   Triglycerides 440  <102 mg/dL   HDL 84  >72 mg/dL   Total CHOL/HDL Ratio 1.8     VLDL 26  0 -  40 mg/dL   LDL Cholesterol 45  0 - 99 mg/dL   Comment:            Total Cholesterol/HDL:CHD Risk     Coronary Heart Disease Risk Table                         Men   Women      1/2 Average Risk   3.4   3.3      Average Risk       5.0   4.4      2 X Average Risk   9.6   7.1      3 X Average Risk  23.4   11.0                Use the calculated Patient Ratio     above and the CHD Risk Table     to determine the patient's CHD  Risk.                ATP III CLASSIFICATION (LDL):      <100     mg/dL   Optimal      161-096  mg/dL   Near or Above                        Optimal      130-159  mg/dL   Borderline      045-409  mg/dL   High      >811     mg/dL   Very High  TROPONIN I     Status: None   Collection Time    12/12/12  1:43 AM      Result Value Range   Troponin I <0.30  <0.30 ng/mL   Comment:            Due to the release kinetics of cTnI,     a negative result within the first hours     of the onset of symptoms does not rule out     myocardial infarction with certainty.     If myocardial infarction is still suspected,     repeat the test at appropriate intervals.  GLUCOSE, CAPILLARY     Status: None   Collection Time    12/12/12  6:47 AM      Result Value Range   Glucose-Capillary 87  70 - 99 mg/dL   Comment 1 Documented in Chart     Comment 2 Notify RN    GLUCOSE, CAPILLARY     Status: Abnormal   Collection Time    12/12/12 11:59 AM      Result Value Range   Glucose-Capillary 117 (*) 70 - 99 mg/dL    Ct Head Wo Contrast  12/11/2012  *RADIOLOGY REPORT*  Clinical Data: Severe headache.  History of hemorrhagic stroke.  CT HEAD WITHOUT CONTRAST  Technique:  Contiguous axial images were obtained from the base of the skull through the vertex without contrast.  Comparison: Head CT 09/03/2012  Findings: No acute intracranial hemorrhage.  No focal mass lesion. No CT evidence of acute infarction.   No midline shift or mass effect.  No hydrocephalus.  Basilar cisterns are patent.  Mild periventricular and subcortical white matter hypodensities unchanged from prior. Paranasal sinuses and mastoid air cells are clear.  Orbits are normal.  IMPRESSION:  1.  No acute intracranial findings.  2.  Mild microvascular disease.   Original Report Authenticated  By: Genevive Bi, M.D.    [name]  12/12/2012  *RADIOLOGY REPORT*  Clinical Data: Hemorrhagic CVA October 2013 with left-sided deficits and left  hemianopsia.  Now with headache and unstable gait  MRI HEAD WITHOUT CONTRAST  Technique:  Multiplanar, multiecho pulse sequences of the brain and surrounding structures were obtained according to standard protocol without intravenous contrast.  Comparison: CT head 12/11/2012, MRI 08/17/2012  Findings: Chronic hemorrhage in the right posterior temporal- occipital lobe containing hemosiderin.  This has contracted and become chronic since the prior study when it was subacute blood. No other areas of hemorrhage are identified.  No acute infarct. There is some patchy hyperintensity in the right parietal cortex on diffusion weighted imaging which was present previously and  I believe may be due to magnetic field distortion from the hemosiderin.  This does not appear to represent an acute infarct.  Chronic microvascular ischemic changes are present in the white matter, similar to the prior MRI.  Ventricle size is normal. Negative for mass or midline shift.  Mild mucosal edema left maxillary sinus.  17 mm cyst at the base of the nose on the left is unchanged and may be  dermal in origin.  IMPRESSION: Chronic hemorrhage right posterior temporal-occipital lobe as noted on the prior MRI.  This may be due to a chronic hemorrhagic infarct or versus a hypertensive bleed or due to a small vascular malformation.  No other areas of hemorrhage or acute infarct are present.   Original Report Authenticated By: Janeece Riggers, M.D.    Dg Chest Port 1 View  12/11/2012  *RADIOLOGY REPORT*  Clinical Data: Possible stroke, history diabetes  PORTABLE CHEST - 1 VIEW  Comparison: Chest x-ray of 09/05/2012  Findings: The lungs are clear but hyperaerated consistent with emphysema.  Mediastinal contours are stable.  The heart is within upper limits of normal.  No acute skeletal abnormality is seen.  IMPRESSION: Hyperaeration consistent with emphysema.  No active lung disease.   Original Report Authenticated By: Dwyane Dee, M.D.    Review Of  Systems: Per HPI and PMH. Otherwise 12 point review of systems was unremarkable.  Blood pressure 129/67, pulse 117, temperature 99.5 F (37.5 C), temperature source Oral, resp. rate 20, height 5\' 5"  (1.651 m), weight 46.494 kg (102 lb 8 oz), SpO2 97.00%.  Exam:  General: Thin white female, no acute distress, laying comfortably in bed. HEENT: MMM, PERRL, ear canals with cerumen, TMs not visualized, cataracts apparent  Nasal examination shows normal mucosa, septum, turbinates. Facial examination shows no asymmetry. Palpation of the face elicit no significant tenderness. Oral cavity examination shows no mucosal lacerations. No significant trismus is noted. Palpation of the neck reveals no lymphadenopathy or mass. The trachea is midline. Cardiovascular: rrr, no mrg, 2+ distal pulses  Respiratory: CTAB, no wheezes or crackles   Procedure:  Flexible Fiberoptic Laryngoscopy Anesthesia: Topical oxymetazoline and lidocaine Indication: Hoarseness, possible laryngeal foreign body Description: Risks, benefits, and alternatives of flexible endoscopy were explained to the patient. Specific mention was made of the risk of throat numbness with difficulty swallowing, possible bleeding from the nose and mouth, and pain from the procedure.  The patient gave oral consent to proceed.  The nasal cavities were decongested and anesthetised with a combination of oxymetazoline and 4% lidocaine solution.  The flexible scope was inserted into the right nasal cavity and advanced towards the nasopharynx.  Visualized mucosa over the turbinates and septum were normal.  The nasopharynx was clear.  Oropharyngeal walls  were symmetric and mobile without lesion, mass, or edema.  Hypopharynx was also without  lesion or edema.  A large 1cm pedunculated polyp is noted on the left posterior vocal cord. Her vocal cords are otherwise mobile and symmetric.  Assessment/Plan: A 1 cm pedunculated polyp is noted on the left vocal cord. This is  likely the cause of the patient's hoarseness and the foreign body noted on the x-ray study. No other foreign body or abnormality is noted. Since the polyp is obstructing a large portion of the glottic opening, it is strongly recommended that the polyp should be removed. The procedure will be performed under general anesthesia. The risks, benefits, alternatives, and details of the procedure are reviewed with the patient and her husband. Questions are invited and answered. Informed consent is obtained.  Jamari Moten,SUI W 12/12/2012, 3:13 PM

## 2012-12-12 NOTE — Transfer of Care (Signed)
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

## 2012-12-12 NOTE — Evaluation (Signed)
Occupational Therapy Evaluation Patient Details Name: [name] [name] MRN: [id number] DOB: 1941-07-08 Today's Date: 12/12/2012 Time: 4098-1191 OT Time Calculation (min): 25 min  OT Assessment / Plan / Recommendation Clinical Impression  Pt admitted with headache and unsteady gait.  Pt had hemorrhagic CVA in 08/2012 and since that time has had problems with left sided deficits including left hemianopia, left sensory deficits with possible apraxia and neglect.  Difficult to assess if current deficits are her baseline or worse.  Pt would benefit from continued OT services but noted OT order discontinued.  Please re-order if pt is not discharged.  Recommending HHOT although pt's husband seems unsure of need for further OT services (concerned regarding insurance coverage).    OT Assessment   (needs continued OT services, but order discontinued)    Follow Up Recommendations  Home health OT;Supervision/Assistance - 24 hour    Barriers to Discharge      Equipment Recommendations       Recommendations for Other Services    Frequency       Precautions / Restrictions Precautions Precautions: Fall   Pertinent Vitals/Pain C/o headache during ambulation    ADL  Upper Body Bathing: Simulated;Minimal assistance Where Assessed - Upper Body Bathing: Unsupported sitting Lower Body Bathing: Simulated;Minimal assistance Where Assessed - Lower Body Bathing: Unsupported sitting Upper Body Dressing: Simulated;Minimal assistance Where Assessed - Upper Body Dressing: Unsupported sitting Lower Body Dressing: Performed;Moderate assistance Where Assessed - Lower Body Dressing: Supported sit to Pharmacist, hospital: Mining engineer Method: Sit to Barista:  (bed) Toileting - Architect and Hygiene: Simulated;Min guard Where Assessed - Engineer, mining and Hygiene: Standing Equipment Used: Gait belt Transfers/Ambulation Related to  ADLs: min assist with HHA x1 and min verbal cueing for navigating through environment due to left visual field deficits ADL Comments: Pt limited by left visual field deficits.  Pt reporting several times how she cannot find her call bell.  Requested soft touch call button from RN and to tape bright green paper on call button in order for pt to locate it more easily.  During ambulation, pt tends to draw LUE up into flexor position but able to extend when tactically cued.    OT Diagnosis:    OT Problem List:   OT Treatment Interventions:     OT Goals    Visit Information  Last OT Received On: 12/12/12 Assistance Needed: +1 PT/OT Co-Evaluation/Treatment: Yes    Subjective Data      Prior Functioning     Home Living Lives With: Spouse;Daughter Available Help at Discharge: Family;Available 24 hours/day Type of Home: House Home Access: Level entry Home Layout: One level Home Adaptive Equipment: Walker - rolling Prior Function Level of Independence: Needs assistance Needs Assistance: Meal Prep;Light Housekeeping;Bathing Able to Take Stairs?: No Driving: No Comments: Need for assistance primarily due to absent left visual field Communication Communication: HOH Dominant Hand: Right         Vision/Perception Vision - History Baseline Vision:  (left visual field deficit from stroke in Oct. '13) Patient Visual Report: Other (comment) ("can't see") Vision - Assessment Eye Alignment: Impaired (comment) Vision Assessment: Vision tested Ocular Range of Motion: Restricted on the left Alignment/Gaze Preference: Gaze right Tracking/Visual Pursuits: Impaired - to be further tested in functional context (decreased smoothing tracking left ) Saccades: Impaired - to be further tested in functional context (difficulty to left) Visual Fields: Left visual field deficit Additional Comments: min-mod verbal cueing to scan environment for objects.  Cognition  Cognition Overall Cognitive  Status: Impaired Area of Impairment: Memory Arousal/Alertness: Awake/alert Orientation Level: Appears intact for tasks assessed Behavior During Session: [facility] for tasks performed Memory Deficits: difficulty recalling/ability to describe baseline deficits. seems a poor historian.    Extremity/Trunk Assessment Right Upper Extremity Assessment RUE ROM/Strength/Tone: Within functional levels RUE Sensation: WFL - Light Touch;WFL - Proprioception RUE Coordination: WFL - gross/fine motor Left Upper Extremity Assessment LUE ROM/Strength/Tone: Deficits LUE ROM/Strength/Tone Deficits: tends to draw UE into flexion during ambulation. AROM WFL LUE Sensation: Deficits LUE Sensation Deficits: Pt unable to detect deep pressure or pinching stimuli. LUE Coordination: Deficits LUE Coordination Deficits: decreased  Right Lower Extremity Assessment RLE ROM/Strength/Tone: WFL for tasks assessed RLE Coordination: WFL - gross motor Left Lower Extremity Assessment LLE ROM/Strength/Tone: WFL for tasks assessed LLE Coordination: Deficits LLE Coordination Deficits: decreased     Mobility Bed Mobility Bed Mobility: Supine to Sit;Sit to Supine Supine to Sit: 4: Min guard Sit to Supine: 4: Min guard Transfers Transfers: Sit to Stand;Stand to Sit Sit to Stand: 4: Min guard;With upper extremity assist;From bed Stand to Sit: 4: Min guard;Without upper extremity assist;To bed Details for Transfer Assistance: verbal/tactile cues for safety     Exercise     Balance     End of Session OT - End of Session Equipment Utilized During Treatment: Gait belt Activity Tolerance: Patient tolerated treatment well Patient left: in bed;with call bell/phone within reach;with bed alarm set;with family/visitor present Nurse Communication: Mobility status  GO   12/12/2012 Cipriano Mile OTR/L Pager [phone] Office [phone]   Cipriano Mile 12/12/2012, 1:23 PM

## 2012-12-12 NOTE — Evaluation (Signed)
Physical Therapy Evaluation Patient Details Name: [name] [name] MRN: [id number] DOB: 1941-08-11 Today's Date: 12/12/2012 Time: 6295-2841 PT Time Calculation (min): 28 min  PT Assessment / Plan / Recommendation Clinical Impression  Pt dx with CVA 08/2012.  Difficult to assess if current deficits are from this previous CVA or new occurence.  Pt is poor historian and spouse is unsure of the extent of previous deficits.  Pt/spouse's desire is for pt to return home with no PT services.  He is concerned re: insurance coverage as pt just finished receiving HHPT in January of this year.  Spouse feels he and his daughter can provide level of care needed.    PT Assessment  Patient needs continued PT services    Follow Up Recommendations  No PT follow up    Does the patient have the potential to tolerate intense rehabilitation      Barriers to Discharge None      Equipment Recommendations  None recommended by PT    Recommendations for Other Services     Frequency Min 4X/week    Precautions / Restrictions Precautions Precautions: Fall   Pertinent Vitals/Pain 3/10      Mobility  Bed Mobility Bed Mobility: Supine to Sit;Sit to Supine Supine to Sit: 4: Min guard Sit to Supine: 4: Min guard Transfers Transfers: Sit to Stand;Stand to Sit Sit to Stand: 4: Min guard;With upper extremity assist;From bed Stand to Sit: 4: Min guard;Without upper extremity assist;To bed Details for Transfer Assistance: verbal/tactile cues for safety Ambulation/Gait Ambulation/Gait Assistance: 4: Min assist Ambulation Distance (Feet): 200 Feet Assistive device: 1 person hand held assist Ambulation/Gait Assistance Details: left neglect, Min assist to avoid obstacles on left Gait Pattern: Within Functional Limits    Exercises     PT Diagnosis: Difficulty walking  PT Problem List: Decreased balance;Decreased mobility;Decreased coordination;Decreased safety awareness PT Treatment Interventions: Gait  training;Functional mobility training;Therapeutic activities;Balance training;Neuromuscular re-education;Patient/family education   PT Goals Acute Rehab PT Goals PT Goal Formulation: With patient/family Time For Goal Achievement: 12/18/12 Potential to Achieve Goals: Good Pt will go Sit to Stand: with supervision PT Goal: Sit to Stand - Progress: Goal set today Pt will go Stand to Sit: with supervision PT Goal: Stand to Sit - Progress: Goal set today Pt will Transfer Bed to Chair/Chair to Bed: with supervision PT Transfer Goal: Bed to Chair/Chair to Bed - Progress: Goal set today Pt will Ambulate: >150 feet;with supervision PT Goal: Ambulate - Progress: Goal set today  Visit Information  Last PT Received On: 12/12/12 Assistance Needed: +1 PT/OT Co-Evaluation/Treatment: Yes    Subjective Data  Subjective: "I have a headache." Patient Stated Goal: home   Prior Functioning  Home Living Lives With: Spouse;Daughter Available Help at Discharge: Family;Available 24 hours/day Type of Home: House Home Access: Level entry Home Layout: One level Home Adaptive Equipment: Walker - rolling Prior Function Level of Independence: Needs assistance Needs Assistance: Meal Prep;Light Housekeeping;Bathing Able to Take Stairs?: No Driving: No Comments: Need for assistance primarily due to absent left visual field Communication Communication: HOH    Cognition  Cognition Overall Cognitive Status: Appears within functional limits for tasks assessed/performed Arousal/Alertness: Awake/alert Orientation Level: Appears intact for tasks assessed Behavior During Session: [facility] for tasks performed    Extremity/Trunk Assessment Right Lower Extremity Assessment RLE ROM/Strength/Tone: [facility] for tasks assessed RLE Coordination: WFL - gross motor Left Lower Extremity Assessment LLE ROM/Strength/Tone: WFL for tasks assessed LLE Coordination: Deficits LLE Coordination Deficits: decreased   Balance    End  of Session PT - End of Session Equipment Utilized During Treatment: Gait belt Activity Tolerance: Patient tolerated treatment well Patient left: in bed;with family/visitor present;with call bell/phone within reach Nurse Communication: Mobility status;Patient requests pain meds  GP     Ilda Foil 12/12/2012, 11:26 AM  Aida Raider, PT  Office # [phone] Pager [phone]

## 2012-12-12 NOTE — Progress Notes (Signed)
Occupational Therapy Note  Noted OT order discontinued. Please re-order if pt not discharged home today (12/13/11) since pt could benefit from OT services.  12/12/2012 Cipriano Mile OTR/L Pager [phone] Office [phone]

## 2012-12-12 NOTE — H&P (Signed)
Seen and examined.  Discussed with Dr. Durene Cal.  Agree with his documentation and management. Briefly, 72 yo female with previous hemorrhagic stroke presented with primary symptom of headache and secondary symptoms of decreased PO, vomit x 1 and being less steady on her feet.  This morning, she feels much better, still lingering mild headache.  Ambulated OK.  MRI of brain is unchanged. Imp: Presumed viral syndrome cause temporary decompensation/set back in her recovery.  She and husband were relieved by no new findings on MRI.  They feel safe to return home and return to ongoing PT/OT as currently ordered.

## 2012-12-12 NOTE — Evaluation (Signed)
Clinical/Bedside Swallow Evaluation Patient Details  Name: [name] [name] MRN: [id number] Date of Birth: Mar 28, 1941  Today's Date: 12/12/2012 Time: 1200-1300 SLP Time Calculation (min): 60 min  Past Medical History:  Past Medical History  Diagnosis Date  . Diabetes mellitus   . GERD (gastroesophageal reflux disease)   . Seizures   . Cataract   . Anxiety   . Stroke    Past Surgical History: History reviewed. No pertinent past surgical history. HPI:  TIA vs CVA: patient with history of previous hemorrhagic CVA and now with unsteady gait, worsening left sided visual deficit, HA, and confusion for 2 days.  Given length of symptoms this is potential another CVA. Per ED PA verbal report of husband giving history that patient appeared back at baseline upon arrival to ED could be related to TIA. Patient with EKG with NSR so CVA less likely related to embolism from afib. Patient referred for BSE per stroke protocol.    Assessment / Plan / Recommendation Clinical Impression  Moderate oral dysphagia with moderate to severe pharyngeal dysphagia.  +s/s of aspiration during administration of oral care.    Decreased oral awareness with all PO trials resulting in oral holding.  Patient required max verbal cues to initiate mastication with ice chips and solids.  Gagging and coughing noted with solids as patient attempted to swallow cracker whole.  Multiple swalllows required for each trial which is reported in previous MBS on 09/01/12.  Pharyngeal phase marked by decreased hyoid laryngeal elevation with soft signs of aspiration s/p swallow of all PO trials. MRI unremarkable unsure of why change in swallow status.  Due to s/s present and prior history of dysphagia recommend to proceed with objective evaluation to detemine aspiration and recommend safest, PO diet.     Aspiration Risk  Moderate    Diet Recommendation NPO   Medication Administration: Via alternative means    Other  Recommendations Recommended  Consults: MBS Oral Care Recommendations: Oral care QID   Follow Up Recommendations  Home health SLP    Frequency and Duration min 2x/week  2 weeks       SLP Swallow Goals  Pending MBS   Swallow Study Prior Functional Status  Type of Home: House Lives With: Spouse;Daughter Available Help at Discharge: Family;Available 24 hours/day    General Date of Onset: 12/11/12 HPI: TIA vs CVA: patient with history of previous hemorrhagic CVA and now with unsteady gait, worsening left sided visual deficit, HA, and confusion for 2 days.  Given length of symptoms this is potential another CVA. Per ED PA verbal report of husband giving history that patient appeared back at baseline upon arrival to ED could be related to TIA. Patient with EKG with NSR so CVA less likely related to embolism from afib. Type of Study: Bedside swallow evaluation Previous Swallow Assessment: MBS 08/25/12 dys 1 with honey thick liquids, MBS 09/01/12 dys 2, thin Diet Prior to this Study: NPO Temperature Spikes Noted: No Respiratory Status: Room air History of Recent Intubation: No Behavior/Cognition: Confused;Lethargic;Requires cueing Self-Feeding Abilities: Total assist Patient Positioning: Upright in bed Baseline Vocal Quality: Clear Volitional Cough: Weak Volitional Swallow: Able to elicit    Oral/Motor/Sensory Function Overall Oral Motor/Sensory Function: Impaired at baseline Labial ROM: Reduced left Labial Symmetry: Abnormal symmetry left Labial Strength: Reduced Labial Sensation: Reduced Lingual ROM: Reduced left Lingual Symmetry: Abnormal symmetry left Lingual Strength: Reduced Facial ROM: Reduced left Facial Symmetry: Left droop Facial Strength: Reduced Facial Sensation: Reduced Velum: Impaired left Mandible: Within Functional Limits  Ice Chips Ice chips: Impaired Presentation: Spoon Oral Phase Impairments: Poor awareness of bolus;Impaired mastication;Impaired anterior to posterior transit Oral  Phase Functional Implications: Oral holding Pharyngeal Phase Impairments: Suspected delayed Swallow;Decreased hyoid-laryngeal movement;Wet Vocal Quality;Throat Clearing - Immediate   Thin Liquid Thin Liquid: Impaired Presentation: Spoon;Cup Oral Phase Impairments: Impaired anterior to posterior transit Oral Phase Functional Implications: Oral holding Pharyngeal  Phase Impairments: Multiple swallows    Nectar Thick Nectar Thick Liquid: Impaired Presentation: Spoon Oral phase functional implications: Oral holding Pharyngeal Phase Impairments: Throat Clearing - Delayed;Suspected delayed Swallow;Decreased hyoid-laryngeal movement;Multiple swallows   Honey Thick Honey Thick Liquid: Impaired Presentation: Cup;Spoon Oral Phase Impairments: Reduced lingual movement/coordination Pharyngeal Phase Impairments: Suspected delayed Swallow;Decreased hyoid-laryngeal movement;Multiple swallows   Puree Puree: Impaired Oral Phase Functional Implications: Prolonged oral transit;Oral holding Pharyngeal Phase Impairments: Suspected delayed Swallow;Decreased hyoid-laryngeal movement;Multiple swallows   Solid   GO    Solid: Impaired Oral Phase Impairments: Poor awareness of bolus;Reduced lingual movement/coordination;Impaired anterior to posterior transit Oral Phase Functional Implications: Oral residue;Oral holding Pharyngeal Phase Impairments: Wet Vocal Quality;Throat Clearing - Delayed      Moreen Fowler MS, CCC-SLP [phone] [facility] 12/12/2012,1:39 PM

## 2012-12-12 NOTE — Progress Notes (Signed)
Seen and examined.  Discussed with Dr. Durene Cal.  My note of today is contained in the co-sign of his H&PE.  OK to DC since she is feeling better and MRI results are reassuring.

## 2012-12-12 NOTE — Preoperative (Signed)
Beta Blockers   Reason not to administer Beta Blockers:Not Applicable 

## 2012-12-13 DIAGNOSIS — J381 Polyp of vocal cord and larynx: Secondary | ICD-10-CM | POA: Diagnosis present

## 2012-12-13 DIAGNOSIS — G459 Transient cerebral ischemic attack, unspecified: Secondary | ICD-10-CM | POA: Diagnosis present

## 2012-12-13 LAB — GLUCOSE, CAPILLARY
Glucose-Capillary: 120 mg/dL — ABNORMAL HIGH (ref 70–99)
Glucose-Capillary: 408 mg/dL — ABNORMAL HIGH (ref 70–99)
Glucose-Capillary: 345 mg/dL — ABNORMAL HIGH (ref 70–99)

## 2012-12-13 LAB — BASIC METABOLIC PANEL
BUN: 18 mg/dL (ref 6–23)
GFR calc Af Amer: 73 mL/min — ABNORMAL LOW (ref >90)
GFR calc non Af Amer: 63 mL/min — ABNORMAL LOW (ref >90)
Potassium: 4.3 mEq/L (ref 3.5–5.1)
Sodium: 140 mEq/L (ref 135–145)

## 2012-12-13 LAB — GLUCOSE, RANDOM: Glucose, Bld: 427 mg/dL — ABNORMAL HIGH (ref 70–99)

## 2012-12-13 LAB — CBC
Hemoglobin: 11.4 g/dL — ABNORMAL LOW (ref 12.0–15.0)
MCHC: 33.2 g/dL (ref 30.0–36.0)
RBC: 3.68 MIL/uL — ABNORMAL LOW (ref 3.87–5.11)

## 2012-12-13 NOTE — Progress Notes (Signed)
PGY-1 Daily Progress Note Family Medicine Teaching Service Service Pager: [phone]   Subjective: Patient doing well this morning. Had vocal cord polyp removed yesterday. States voice is still hoarse this morning and throat sore. Had blood glucose to 65 last night, received 15 g carb snack and repeat glucose was 174. Getting ready to eat this morning.  Objective:  Temp:  [97.1 F (36.2 C)-98.6 F (37 C)] 97.6 F (36.4 C) (02/10 0600) Pulse Rate:  [82-110] 82 (02/10 0600) Resp:  [14-19] 16 (02/10 0600) BP: (102-141)/(38-67) 102/38 mmHg (02/10 0600) SpO2:  [97 %-100 %] 97 % (02/10 0600)  Intake/Output Summary (Last 24 hours) at 12/13/12 9562 Last data filed at 12/13/12 0000  Gross per 24 hour  Intake   2000 ml  Output     10 ml  Net   1990 ml    General: no acute distress, laying comfortably in bed   HEENT: MMM Cardiovascular: rrr, no mrg appreciated  Respiratory: CTAB, no wheezes or crackles  Abdomen: S, NT, ND  Extremities: no edema, no cyanosis  Neuro: 5/5 strength throughout, appeared to neglect left visual field and had intermittent neglect of left side with need for prompting for about 20 seconds for her to use left hand, sensation to light touch intact, extraoccular muscles difficult to evaluate given patient had no left visual field vision and wouldn't track past midline. CN 5, 7, 8, 9, 10, 11, 12, intact.    Labs and imaging:   CBC  Recent Labs Lab 12/11/12 1245 12/11/12 1354 12/13/12 0730  WBC  --  10.3 8.4  HGB 14.3 12.8 11.4*  HCT 42.0 38.1 34.3*  PLT  --  195 199   BMET/CMET  Recent Labs Lab 12/11/12 1245 12/13/12 0730  NA 137 140  K 4.9 4.3  CL 102 105  CO2  --  25  BUN 30* 18  CREATININE 1.30* 0.90  CALCIUM  --  8.0*  GLUCOSE 197* 118*   A1c 8.8 CBGs 117-177  Ct Head Wo Contrast  12/11/2012  IMPRESSION:  1.  No acute intracranial findings.  2.  Mild microvascular disease.   Original Report Authenticated By: Genevive Bi, M.D.    Mr  Brain Wo Contrast  12/12/2012  IMPRESSION: Chronic hemorrhage right posterior temporal-occipital lobe as noted on the prior MRI.  This may be due to a chronic hemorrhagic infarct or versus a hypertensive bleed or due to a small vascular malformation.  No other areas of hemorrhage or acute infarct are present.   Original Report Authenticated By: Janeece Riggers, M.D.    Dg Chest Port 1 View  12/11/2012  IMPRESSION: Hyperaeration consistent with emphysema.  No active lung disease.   Original Report Authenticated By: Dwyane Dee, M.D.    Dg Swallowing Func-speech Pathology  12/12/2012   Foreign object observed  in airway at level of cords.  Radiologist called into suite to  observe directly and confirmed.  Object noted to move upon  patient coughing.  Patient noted to become dysphonic.  Results of  study indicate patient appropriate for dysphagia 1 diet  consistency and thin liquids with strict aspiration precautions.     Scheduled: . insulin aspart  0-5 Units Subcutaneous QHS  . insulin aspart  0-9 Units Subcutaneous TID WC  . insulin detemir  6 Units Subcutaneous Q breakfast  . levETIRAcetam  250 mg Oral Q12H  . metoprolol tartrate  25 mg Oral BID  . multivitamin with minerals  1 tablet Oral Daily  . pantoprazole  40 mg Oral Daily  . vitamin C  500 mg Oral Daily   Continuous: . sodium chloride 100 mL/hr at 12/13/12 0000   ZOX:[id number], acetaminophen, acetaminophen, acetaminophen, ondansetron (ZOFRAN) IV, ondansetron (ZOFRAN) IV, ondansetron, senna-docusate  Assessment  [name] [name] is a 72 y.o. year old female with a history of previous hemorrhagic stroke, type II DM, HTN, CKD, and seizure disorder presenting with 2 day history of unsteady gait, worsening left sided vision, and headache.   1. TIA vs CVA: patient with history of previous hemorrhagic CVA and now with ? unsteady gait, ?worsening left sided visual deficit, HA, and confusion for 2 days. No CVA, likely worsening of baseline symptoms  due to physiologic stressor with low grade temp of 100.2, sore throat. Now back to baseline.  -Neuro consulted and believe this may be exacerbation of baseline symptoms due to physiologic stressor ?viral illness with nausea, sore throat  and low grade temp   -MRI revealed no CVA   -no ASA due to history of hemorrhagic CVA  -risk stratify with A1c (8.8) and lipid panel (LDL 45)  -OT recs home health OT  -no PT needs   -out of bed with assistance, fall precautions   2. 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 History of seizure: related to previous stroke   -will continue home keppra 250 mg q12 hr   4. DM, type II: A1c 8.8  -continue home levemir 6 u qam-consider increasing to 10 u once eating again given A1c of 8.8   -SSI sensitive   -hold home metformin and januvia while in hospital-would not increase as outpatient given history of renal dysfunction   5. CKD: per chart is stage 3. Cr 1.3 on admission, baseline appears to be around 1.14. Now Cr 0.9. Echo within a year without systolic CHF.   -will hydrate with NS @ 100 mL/hr.   -encourage good liquid intake on discharge   6. GERD: will treat with protonix while in hospital   FEN/GI: dysphagia 1, NS @ 100 mL/hr  Prophylaxis: SCDs, zofran, protonix  Disposition: telemetry. Possible discharge today if tolerating PO intake. Code Status: full   Marikay Alar, MD PGY1, Family Medicine Teaching Service [phone]

## 2012-12-13 NOTE — Progress Notes (Signed)
Patient had a blood glucose of 408 at noon; gave patient 9 units novolog; MD notified; placed order for stat lab blood glucose.  Patient asymptomatic.

## 2012-12-13 NOTE — Op Note (Signed)
Noted:  Thanks for routing to me.

## 2012-12-13 NOTE — Progress Notes (Signed)
Random glucose came back at 427; MD notified.  Instructed to give 10 units of insulin and recheck in 30 minutes.   Glucose down to 211.   Will monitor blood sugar, give sliding scale novolog until patient is within normal limits.

## 2012-12-13 NOTE — Progress Notes (Signed)
Speech Language Pathology Dysphagia Treatment Patient Details Name: [name] [name] MRN: [id number] DOB: 1941-07-26 Today's Date: 12/13/2012 Time: 1330-     Assessment / Plan / Recommendation Clinical Impression  Patient denies odynophagia, globus sensation, residue in throat, or any difficulty swallowing at this time.  Swallow function appears to be much improved s/p removal of a 1 cm. sized peduculated mass from the left vocal cord.  Voice is now clear as well, with normal phonation, as opposed to the hoarse voice experienced for the past 2 weeks.  Patient is currently able to swallow consecutive swallows of thin liquid, and swallowed graham crackers without difficulty.  Husband reports HH SLP has already been set up.  SLP phoned advanced home care to d/c, as no f/u is needed at this time.    Diet Recommendation  Continue with Current Diet: Thin liquid;Regular Initiate / Change Diet: Regular;Thin liquid    SLP Plan Discharge SLP treatment due to (comment) (goals met; no further ST needs identified.)   Pertinent Vitals/Pain n/a   Swallowing Goals  SLP Swallowing Goals Patient will consume recommended diet without observed clinical signs of aspiration with: Supervision/safety Swallow Study Goal #1 - Progress: Met  General Temperature Spikes Noted: No Respiratory Status: Room air Behavior/Cognition: Alert;Cooperative;Pleasant mood;Requires cueing Oral Cavity - Dentition: Adequate natural dentition Patient Positioning: Upright in bed  Oral Cavity - Oral Hygiene Does patient have any of the following "at risk" factors?: None of the above Brush patient's teeth BID with toothbrush (using toothpaste with fluoride): Yes   Dysphagia Treatment Treatment focused on: Skilled observation of diet tolerance;Upgraded PO texture trials;Patient/family/caregiver education Family/Caregiver Educated: Husband Treatment Methods/Modalities: Skilled observation Patient observed directly with PO's: Yes Type  of PO's observed: Dysphagia 3 (soft);Thin liquids Feeding: Able to feed self;Needs assist;Needs set up Liquids provided via: Cup   GO     [name] [name] T 12/13/2012, 2:20 PM

## 2012-12-13 NOTE — Discharge Summary (Addendum)
Physician Discharge Summary  Patient ID: [name] [name] MRN: [id number] DOB: Mar 01, 1941 Age: 72 y.o.  Admit date: 12/11/2012 Discharge date: 12/13/2012 Admitting Physician: Sanjuana Letters, MD  PCP: Aida Puffer, MD  Consultants: Neurology, Dr. Fransisco Hertz ENT, Dr. Suszanne Conners     Discharge Diagnosis: Principal Problem:   TIA (transient ischemic attack) Active Problems:   GERD (gastroesophageal reflux disease)   DM type 2 (diabetes mellitus, type 2)   Dysphagia   CKD (chronic kidney disease) stage 3, GFR 30-59 ml/min   Polyp of vocal cord    Hospital Course [name] [name] is a 72 y.o. year old female with a history of previous hemorrhagic stroke, type II DM, HTN, CKD, and seizure disorder presenting with 2 day history of unsteady gait, worsening left sided vision, and headache.   1. TIA vs CVA: patient with history of previous hemorrhagic CVA and presneted with unsteady gait, worsening left sided hemianopia, HA, and confusion for 2 days. CT scan showed no acute changes. Neurology was consulted and recommended obtaining MRI and not giving aspirin due previous hemorrhagic stroke.  MRI revealed old CVA remnants, but no new lesions. Neuro believed symptoms were related to physiologic stressor (low grade temp, sore throat, vomiting) as opposed to recurrent CVA. Risk stratification took place and A1c 8.8 and LDL 45. Patient was back to base line at time of discharge. Patient will have home health OT/PT per recs.  2. Polyp on left vocal cord: as part of SLP evaluation patient was noted to have dysphagia and hoarseness. A barium swallow was ordered that revealed "a foreign object." ENT was consulted and did laryngoscopy that revealed a left vocal cord polyp. Patient was taken to surgery for removal. At time of discharge her dysphagia was improved to baseline. Patient is to follow-up with ENT in one week.  3. History of seizure: related to previous stroke. Continued on home keppra 250 mg q12 hr.   4. DM,  type II: A1c 8.8. Continued home levemir 6 u qam. Additionally on SSI. Home metformin and januvia held in the hospital.   5. CKD: per chart is stage 3. Cr 1.3 on admission, baseline appears to be around 1.14. Cr 0.9 at discharge. Echo within a year without systolic CHF. Patient was hydrated with NS @100  ml/hr.   6. GERD: treated with protonix while in hospital   7. Malnutrition: per dietician notes patient meets criteria for malnutrition likely related to poor appetite. Recs included magic cup TID between meals.   Problem List 1. TIA 2. Left vocal cord polyp 3. Seizure history 4. DM, type II 5. CKD 6. GERD         Discharge PE   Filed Vitals:   12/13/12 0927  BP: 138/52  Pulse: 96  Temp: 97.8 F (36.6 C)  Resp: 18   General: no acute distress, laying comfortably in bed  HEENT: MMM  Cardiovascular: rrr, no mrg appreciated  Respiratory: CTAB, no wheezes or crackles  Abdomen: S, NT, ND  Extremities: no edema, no cyanosis  Neuro: 5/5 strength throughout, appeared to neglect left visual field and had intermittent neglect of left side with need for prompting for about 20 seconds for her to use left hand, sensation to light touch intact, extraoccular muscles difficult to evaluate given patient had no left visual field vision and wouldn't track past midline. CN 5, 7, 8, 9, 10, 11, 12, intact.    Procedures/Imaging:  Ct Head Wo Contrast  12/11/2012 IMPRESSION:  1.  No acute intracranial findings.  2.  Mild microvascular disease.   Original Report Authenticated By: Genevive Bi, M.D.    [name]  12/12/2012    IMPRESSION: Chronic hemorrhage right posterior temporal-occipital lobe as noted on the prior MRI.  This may be due to a chronic hemorrhagic infarct or versus a hypertensive bleed or due to a small vascular malformation.  No other areas of hemorrhage or acute infarct are present.   Original Report Authenticated By: Janeece Riggers, M.D.    Dg Chest Port 1 View  12/11/2012    IMPRESSION: Hyperaeration consistent with emphysema.  No active lung disease.   Original Report Authenticated By: Dwyane Dee, M.D.    Dg Swallowing Func-speech Pathology  12/12/2012 Foreign object observed in airway at level of cords. Radiologist called into suite to observe directly and confirmed. Object noted to move upon patient coughing. Patient noted to become dysphonic. Results of study indicate patient appropriate for dysphagia 1 diet consistency and thin liquids with strict aspiration precautions.   Labs  CBC  Recent Labs Lab 12/11/12 1245 12/11/12 1354 12/13/12 0730  WBC  --  10.3 8.4  HGB 14.3 12.8 11.4*  HCT 42.0 38.1 34.3*  PLT  --  195 199   BMET  Recent Labs Lab 12/11/12 1245 12/13/12 0730 12/13/12 1310  NA 137 140  --   K 4.9 4.3  --   CL 102 105  --   CO2  --  25  --   BUN 30* 18  --   CREATININE 1.30* 0.90  --   CALCIUM  --  8.0*  --   GLUCOSE 197* 118* 427*   Results for orders placed during the hospital encounter of 12/11/12 (from the past 72 hour(s))  GLUCOSE, CAPILLARY     Status: Abnormal   Collection Time    12/11/12 12:09 PM      Result Value Range   Glucose-Capillary 191 (*) 70 - 99 mg/dL  TROPONIN I     Status: None   Collection Time    12/11/12 12:31 PM      Result Value Range   Troponin I <0.30  <0.30 ng/mL   Comment:            Due to the release kinetics of cTnI,     a negative result within the first hours     of the onset of symptoms does not rule out     myocardial infarction with certainty.     If myocardial infarction is still suspected,     repeat the test at appropriate intervals.  POCT I-STAT, CHEM 8     Status: Abnormal   Collection Time    12/11/12 12:45 PM      Result Value Range   Sodium 137  135 - 145 mEq/L   Potassium 4.9  3.5 - 5.1 mEq/L   Chloride 102  96 - 112 mEq/L   BUN 30 (*) 6 - 23 mg/dL   Creatinine, Ser 1.61 (*) 0.50 - 1.10 mg/dL   Glucose, Bld 096 (*) 70 - 99 mg/dL   Calcium, Ion 0.45 (*) 1.13 - 1.30  mmol/L   TCO2 22  0 - 100 mmol/L   Hemoglobin 14.3  12.0 - 15.0 g/dL   HCT 40.9  81.1 - 91.4 %  CBC WITH DIFFERENTIAL     Status: None   Collection Time    12/11/12  1:54 PM      Result Value Range   WBC 10.3  4.0 - 10.5 K/uL  RBC 4.09  3.87 - 5.11 MIL/uL   Hemoglobin 12.8  12.0 - 15.0 g/dL   HCT 16.1  09.6 - 04.5 %   MCV 93.2  78.0 - 100.0 fL   MCH 31.3  26.0 - 34.0 pg   MCHC 33.6  30.0 - 36.0 g/dL   RDW 40.9  81.1 - 91.4 %   Platelets 195  150 - 400 K/uL   Neutrophils Relative 64  43 - 77 %   Neutro Abs 6.6  1.7 - 7.7 K/uL   Lymphocytes Relative 26  12 - 46 %   Lymphs Abs 2.7  0.7 - 4.0 K/uL   Monocytes Relative 10  3 - 12 %   Monocytes Absolute 1.0  0.1 - 1.0 K/uL   Eosinophils Relative 0  0 - 5 %   Eosinophils Absolute 0.0  0.0 - 0.7 K/uL   Basophils Relative 0  0 - 1 %   Basophils Absolute 0.0  0.0 - 0.1 K/uL  TROPONIN I     Status: None   Collection Time    12/11/12  4:32 PM      Result Value Range   Troponin I <0.30  <0.30 ng/mL   Comment:            Due to the release kinetics of cTnI,     a negative result within the first hours     of the onset of symptoms does not rule out     myocardial infarction with certainty.     If myocardial infarction is still suspected,     repeat the test at appropriate intervals.  URINALYSIS, ROUTINE W REFLEX MICROSCOPIC     Status: None   Collection Time    12/11/12  6:45 PM      Result Value Range   Color, Urine YELLOW  YELLOW   APPearance CLEAR  CLEAR   Specific Gravity, Urine 1.014  1.005 - 1.030   pH 6.0  5.0 - 8.0   Glucose, UA NEGATIVE  NEGATIVE mg/dL   Hgb urine dipstick NEGATIVE  NEGATIVE   Bilirubin Urine NEGATIVE  NEGATIVE   Ketones, ur NEGATIVE  NEGATIVE mg/dL   Protein, ur NEGATIVE  NEGATIVE mg/dL   Urobilinogen, UA 0.2  0.0 - 1.0 mg/dL   Nitrite NEGATIVE  NEGATIVE   Leukocytes, UA NEGATIVE  NEGATIVE   Comment: MICROSCOPIC NOT DONE ON URINES WITH NEGATIVE PROTEIN, BLOOD, LEUKOCYTES, NITRITE, OR GLUCOSE <1000  mg/dL.  GLUCOSE, CAPILLARY     Status: Abnormal   Collection Time    12/11/12 10:20 PM      Result Value Range   Glucose-Capillary 111 (*) 70 - 99 mg/dL   Comment 1 Notify RN     Comment 2 Documented in Chart    HEMOGLOBIN A1C     Status: Abnormal   Collection Time    12/12/12  1:41 AM      Result Value Range   Hemoglobin A1C 8.8 (*) <5.7 %   Comment: (NOTE)                                                                               According to the ADA Clinical Practice Recommendations  for 2011, when     HbA1c is used as a screening test:      >=6.5%   Diagnostic of Diabetes Mellitus               (if abnormal result is confirmed)     5.7-6.4%   Increased risk of developing Diabetes Mellitus     References:Diagnosis and Classification of Diabetes Mellitus,Diabetes     Care,2011,34(Suppl 1):S62-S69 and Standards of Medical Care in             Diabetes - 2011,Diabetes Care,2011,34 (Suppl 1):S11-S61.   Mean Plasma Glucose 206 (*) <117 mg/dL  LIPID PANEL     Status: None   Collection Time    12/12/12  1:41 AM      Result Value Range   Cholesterol 155  0 - 200 mg/dL   Triglycerides 161  <096 mg/dL   HDL 84  >04 mg/dL   Total CHOL/HDL Ratio 1.8     VLDL 26  0 - 40 mg/dL   LDL Cholesterol 45  0 - 99 mg/dL   Comment:            Total Cholesterol/HDL:CHD Risk     Coronary Heart Disease Risk Table                         Men   Women      1/2 Average Risk   3.4   3.3      Average Risk       5.0   4.4      2 X Average Risk   9.6   7.1      3 X Average Risk  23.4   11.0                Use the calculated Patient Ratio     above and the CHD Risk Table     to determine the patient's CHD Risk.                ATP III CLASSIFICATION (LDL):      <100     mg/dL   Optimal      540-981  mg/dL   Near or Above                        Optimal      130-159  mg/dL   Borderline      191-478  mg/dL   High      >295     mg/dL   Very High  TROPONIN I     Status: None   Collection Time     12/12/12  1:43 AM      Result Value Range   Troponin I <0.30  <0.30 ng/mL   Comment:            Due to the release kinetics of cTnI,     a negative result within the first hours     of the onset of symptoms does not rule out     myocardial infarction with certainty.     If myocardial infarction is still suspected,     repeat the test at appropriate intervals.  GLUCOSE, CAPILLARY     Status: None   Collection Time    12/12/12  6:47 AM      Result Value Range   Glucose-Capillary 87  70 - 99 mg/dL   Comment 1 Documented in Chart  Comment 2 Notify RN    GLUCOSE, CAPILLARY     Status: Abnormal   Collection Time    12/12/12 11:59 AM      Result Value Range   Glucose-Capillary 117 (*) 70 - 99 mg/dL  GLUCOSE, CAPILLARY     Status: Abnormal   Collection Time    12/12/12  4:20 PM      Result Value Range   Glucose-Capillary 177 (*) 70 - 99 mg/dL  GLUCOSE, CAPILLARY     Status: Abnormal   Collection Time    12/12/12  7:11 PM      Result Value Range   Glucose-Capillary 137 (*) 70 - 99 mg/dL   Comment 1 Notify RN    GLUCOSE, CAPILLARY     Status: Abnormal   Collection Time    12/12/12 10:28 PM      Result Value Range   Glucose-Capillary 65 (*) 70 - 99 mg/dL  GLUCOSE, CAPILLARY     Status: Abnormal   Collection Time    12/12/12 11:44 PM      Result Value Range   Glucose-Capillary 174 (*) 70 - 99 mg/dL  GLUCOSE, CAPILLARY     Status: Abnormal   Collection Time    12/13/12  6:57 AM      Result Value Range   Glucose-Capillary 120 (*) 70 - 99 mg/dL  BASIC METABOLIC PANEL     Status: Abnormal   Collection Time    12/13/12  7:30 AM      Result Value Range   Sodium 140  135 - 145 mEq/L   Potassium 4.3  3.5 - 5.1 mEq/L   Chloride 105  96 - 112 mEq/L   CO2 25  19 - 32 mEq/L   Glucose, Bld 118 (*) 70 - 99 mg/dL   BUN 18  6 - 23 mg/dL   Creatinine, Ser 1.61  0.50 - 1.10 mg/dL   Calcium 8.0 (*) 8.4 - 10.5 mg/dL   GFR calc non Af Amer 63 (*) >90 mL/min   GFR calc Af Amer 73 (*)  >90 mL/min   Comment:            The eGFR has been calculated     using the CKD EPI equation.     This calculation has not been     validated in all clinical     situations.     eGFR's persistently     <90 mL/min signify     possible Chronic Kidney Disease.  CBC     Status: Abnormal   Collection Time    12/13/12  7:30 AM      Result Value Range   WBC 8.4  4.0 - 10.5 K/uL   RBC 3.68 (*) 3.87 - 5.11 MIL/uL   Hemoglobin 11.4 (*) 12.0 - 15.0 g/dL   HCT 09.6 (*) 04.5 - 40.9 %   MCV 93.2  78.0 - 100.0 fL   MCH 31.0  26.0 - 34.0 pg   MCHC 33.2  30.0 - 36.0 g/dL   RDW 81.1  91.4 - 78.2 %   Platelets 199  150 - 400 K/uL  GLUCOSE, CAPILLARY     Status: Abnormal   Collection Time    12/13/12 11:55 AM      Result Value Range   Glucose-Capillary 408 (*) 70 - 99 mg/dL   Comment 1 Notify RN    GLUCOSE, RANDOM     Status: Abnormal   Collection Time    12/13/12  1:10  PM      Result Value Range   Glucose, Bld 427 (*) 70 - 99 mg/dL  GLUCOSE, CAPILLARY     Status: Abnormal   Collection Time    12/13/12  1:13 PM      Result Value Range   Glucose-Capillary 345 (*) 70 - 99 mg/dL       Patient condition at time of discharge/disposition: stable  Disposition-home   Follow up issues: 1. A1c of 8.8. Will likely need increased dose of lantus as is maxed out with renal dosing of metformin and januvia. 2. Has f/u scheduled with Dr. Suszanne Conners, ENT, ensure patient makes follow-up  3. Patient met requirements severe malnutrition, will need diet supplementation to provide adequate calories as an outpatient  Discharge follow up:  Follow-up Information   Follow up with [facility], [place]. (Please call your primary doctor to schedule a follow-up appointment for some time in the next 2-3 days)    Contact information:   [address] [place] [address] [phone]       Follow up with Darletta Moll, MD On 12/21/2012. (3 pm, show up 15 minutes early and bring your medicare card)    Contact information:    [address] [place] [address] [phone]       Discharge Instructions: Please refer to Patient Instructions section of EMR for full details.  Patient was counseled important signs and symptoms that should prompt return to medical care, changes in medications, dietary instructions, activity restrictions, and follow up appointments.   Discharge Medications   Medication List    TAKE these medications       acetaminophen 325 MG tablet  Commonly known as:  TYLENOL  Take 650 mg by mouth every 6 (six) hours as needed for pain.     insulin detemir 100 UNIT/ML injection  Commonly known as:  LEVEMIR  Inject 6 Units into the skin daily with breakfast.     levETIRAcetam 250 MG tablet  Commonly known as:  KEPPRA  Take 250 mg by mouth every 12 (twelve) hours.     metFORMIN 500 MG tablet  Commonly known as:  GLUCOPHAGE  Take 1 tablet by mouth Twice daily with meals.     metoprolol tartrate 25 MG tablet  Commonly known as:  LOPRESSOR  Take 25 mg by mouth 2 (two) times daily.     multivitamin with minerals Tabs  Take 1 tablet by mouth daily.     omeprazole 40 MG capsule  Commonly known as:  PRILOSEC  Take 40 mg by mouth daily.     sitaGLIPtin 25 MG tablet  Commonly known as:  JANUVIA  Take 50 mg by mouth daily.     vitamin C 500 MG tablet  Commonly known as:  ASCORBIC ACID  Take 500 mg by mouth daily.       Marikay Alar, MD of [facility] 12/13/2012 2:08 PM   Patient has severe protein calorie malnutrition with documented weight loss and poor PO intake for greater than one month.   Patient meets criteria for severe malnutrition in chronic illness as evidenced by 9% weight loss in three months, Intake estimated <75% of needs for >1 month, severe muscle wasting

## 2012-12-13 NOTE — Progress Notes (Signed)
Physical Therapy Treatment Patient Details Name: [name] [name] MRN: [id number] DOB: [birth year] Today's Date: 12/13/2012 Time: 1478-2956 PT Time Calculation (min): 34 min  PT Assessment / Plan / Recommendation Comments on Treatment Session  Severe Left neglect present affecting ambulation, balancwe and safety. Spouse reports that pt was up in house without his assistance until past thursday. Pt. currently has to have strong tactile cues to ambulate due to sever neglect on Left. Pt. will benefit  HHPT.    Follow Up Recommendations  Home health PT     Does the patient have the potential to tolerate intense rehabilitation     Barriers to Discharge        Equipment Recommendations  None recommended by PT    Recommendations for Other Services    Frequency Min 4X/week   Plan Discharge plan remains appropriate;Discharge plan needs to be updated    Precautions / Restrictions Precautions Precautions: Fall Precaution Comments: severe L hemisensory and visual deficits.   Pertinent Vitals/Pain     Mobility  Bed Mobility Supine to Sit: 5: Supervision Transfers Sit to Stand: 4: Min assist Stand to Sit: 4: Min assist;3: Mod assist Details for Transfer Assistance: strong tactile cues to find chair, find foot of bed for exercises, to find armrests at [facility]. Ambulation/Gait Ambulation/Gait Assistance: 4: Min assist Ambulation Distance (Feet): 400 Feet (x 2) Assistive device: 1 person hand held assist Ambulation/Gait Assistance Details: severe L neglect noted. Is unable to locate persons who speak to her on L side. strong tactile cues to prevent bumping into objects. Gait Pattern: Within Functional Limits;Decreased trunk rotation General Gait Details: trunk rotates to the R due to neglect.    Exercises General Exercises - Lower Extremity Long Arc Quad: AAROM;Both;10 reps;Seated Hip ABduction/ADduction: AAROM;Both;10 reps;Seated;Standing Hip Flexion/Marching: AROM;Both;10 reps;Seated   PT  Diagnosis:    PT Problem List:   PT Treatment Interventions:     PT Goals Acute Rehab PT Goals Pt will go Sit to Stand: Independently;with supervision PT Goal: Sit to Stand - Progress: Progressing toward goal Pt will go Stand to Sit: with supervision PT Goal: Stand to Sit - Progress: Progressing toward goal Pt will Ambulate: >150 feet;with supervision PT Goal: Ambulate - Progress: Progressing toward goal  Visit Information  Last PT Received On: 12/13/12 Assistance Needed: +1    Subjective Data  Subjective: I fell in the bathtub.     Cognition  Cognition Overall Cognitive Status: Impaired Area of Impairment: Memory;Attention;Following commands;Awareness of errors Arousal/Alertness: Awake/alert Orientation Level: Situation Behavior During Session: [facility] for tasks performed Current Attention Level: Alternating Attention - Other Comments: required frequent strong tactile cues to attend and follow any  directions related to moving L side or turns, Following Commands: Follows one step commands inconsistently Awareness of Errors: Assistance required to identify errors made;Assistance required to correct errors made Cognition - Other Comments: Pt. could not really follow caommands specific to L environment, automatic to walk, attends to R side mostly.    Balance  Balance Balance Assessed: Yes Static Standing Balance Static Standing - Balance Support: Right upper extremity supported Static Standing - Level of Assistance: 4: Min assist Static Standing - Comment/# of Minutes: appears decreased balance due to loss of visual input from Left and hemisensory deficits, unable to direct LUE to hold/touch objects in L viual field.  End of Session PT - End of Session Equipment Utilized During Treatment: Gait belt Activity Tolerance: Patient tolerated treatment well Patient left: in chair;with family/visitor present Nurse Communication: Mobility status  GP     Rada Hay 12/13/2012, 11:28 AM [phone]

## 2012-12-13 NOTE — Op Note (Signed)
[name], [name] [name]                 ACCOUNT NO.:  [id number]  MEDICAL RECORD NO.:  [id number]  LOCATION:  OTFC                         FACILITY:  [facility]  PHYSICIAN:  Newman Pies, MD            DATE OF BIRTH:  [birth year]  DATE OF PROCEDURE:  12/12/2012 DATE OF DISCHARGE:                              OPERATIVE REPORT   SURGEON:  Newman Pies, MD  PREOPERATIVE DIAGNOSES: 1. Hoarseness. 2. Left vocal cord mass.  POSTOPERATIVE DIAGNOSES: 1. Hoarseness. 2. Left vocal cord mass.  PROCEDURE PERFORMED:  Direct laryngoscopy with excision of the left vocal cord mass.  ANESTHESIA:  General endotracheal tube anesthesia.  COMPLICATIONS:  None.  ESTIMATED BLOOD LOSS:  Minimal.  INDICATION FOR PROCEDURE:  The patient is a 72 year old female who was recently admitted for evaluation of possible cerebrovascular accident. As part of her evaluation, she underwent a barium swallow study.  She was noted to have a possible foreign body within her glottic opening. ENT was therefore consulted for evaluation and treatment.  It should be noted that the patient has been complaining of hoarseness for the past 2 weeks.  On her fiberoptic laryngoscopy evaluation, she was noted to have a 1-cm pedunculated mass on the left vocal cord.  Based on the above findings, the decision was made for the patient to undergo direct laryngoscopy and excision of the left vocal cord mass.  The risks, benefits, alternatives, and details of the procedure were discussed with the patient and her husband.  Questions were invited and answered. Informed consent was obtained.  DESCRIPTION OF PROCEDURE:  The patient was taken to the operating room and placed supine on the operating table.  General face mask anesthesia was initially induced by the anesthesiologist.  Using Dedo laryngoscope, the patient's larynx was carefully evaluated.  A 1-cm pedunculated mass was noted on the posterior left vocal cord.  The mass was removed with  a large laryngeal cup forceps.  The patient was subsequently intubated by the anesthesiologist.  Once she was intubated, the Dedo laryngoscope was reinserted into the oral opening to examine the larynx.  Her vocal cords were noted to be normal.  The rest of the larynx, including epiglottis, aryepiglottic folds and piriform sinuses were all noted to be normal. The care of the patient was turned over to the anesthesiologist.  The patient was awakened by Anesthesia without difficulty.  She was extubated and transferred to the recovery room in good condition.  OPERATIVE FINDINGS:  A 1-cm pedunculated mass was noted on the left vocal cord.  The mass was completely removed.  SPECIMEN:  Left vocal cord mass.  FOLLOWUP CARE:  The patient will be returned to her hospital bed.  She may follow up in my office as an outpatient in 1 week.     Newman Pies, MD     ST/MEDQ  D:  12/12/2012  T:  12/13/2012  Job:  [id number]

## 2012-12-13 NOTE — Progress Notes (Signed)
FMTS Attending Daily Note: Daira Hine MD [phone] pager office [phone] I  have seen and examined this patient, reviewed their chart. I have discussed this patient with the resident. I agree with the resident's findings, assessment and care plan. 

## 2012-12-13 NOTE — Progress Notes (Signed)
Patient's blood glucose came down to 101 after giving 3 more units of novolog.   Patient was ok to discharge.

## 2012-12-13 NOTE — Progress Notes (Signed)
Hypoglycemic Event  CBG: 65  Treatment: 15 GM carbohydrate snack  Symptoms: Hungry  Follow-up CBG: Time: 2340 CBG Result: 174  Possible Reasons for Event: Inadequate meal intake  Comments/MD notified:    Alvira Philips  Remember to initiate Hypoglycemia Order Set & complete

## 2012-12-13 NOTE — Progress Notes (Signed)
INITIAL NUTRITION ASSESSMENT  Pt meets criteria for SEVERE MALNUTRITION in the context of chronic illness as evidenced by 9% weight loss x 3 months, intake estimated to be < 75% of her needs in > 1 month as well as severe muscle wasting.   DOCUMENTATION CODES Per approved criteria  -Severe malnutrition in the context of chronic illness -Underweight   INTERVENTION: 1. Magic cup TID between meals, each supplement provides 290 kcal and 9 grams of protein. 2. Notify Family Practice RD for follow up after discharge.    NUTRITION DIAGNOSIS: Malnutrition related to poor appetite as evidenced by 9% weight loss x 3 months, intake < 75% of her needs in > 1 month, severe muscle wasting.   Goal: Pt to meet >/= 90% of their estimated nutrition needs.   Monitor:  PO intake, diet acceptance/tolerance  Reason for Assessment: Malnutrition Screening Tool  72 y.o. female  Admitting Dx: Headache  ASSESSMENT: Pt admitted TIA. Polyp noted on MBS and removed 2/9. Pt currently remains on Dysphagia 1 diet with thin liquids. Pt ate > 50% at Breakfast this am and was about to consume lunch during my visit. Pt with elevated hgbA1C on admission, MD to adjust insulin regimen.  Pt visibly thin, meeting criteria for underweight. Husband confirms pt has been losing weight PTA. Per pt and husband pt has been eating poorly, question if polyp has been playing a role in her decreased intake. Physical findings confirm muscle wasting. Pt meets criteria for malnutrition. Pt does not really like ensure but has had a magic cup since being admitted to the hospital and really liked it. Husband feels pt will be discharged home today but is seemed anxious about what diet pt will be discharged home on. Spoke with case manager about husbands concerns. Since magic cups are unavailable to pt's after discharge we discussed other supplements to offer pt such as sugar free carnation instant breakfast, sugar free pudding, etc.  With  elevated hgbA1C pt could also have weight loss contributed by high blood sugars. Husband seemed a bit overwhelmed to review diabetic diet at this time.   Height: Ht Readings from Last 1 Encounters:  12/12/12 5\' 5"  (1.651 m)    Weight: Wt Readings from Last 1 Encounters:  12/12/12 102 lb 8 oz (46.494 kg)    Ideal Body Weight: 56.8 kg  % Ideal Body Weight: 82%  Wt Readings from Last 10 Encounters:  12/12/12 102 lb 8 oz (46.494 kg)  12/12/12 102 lb 8 oz (46.494 kg)  10/29/12 106 lb 6.4 oz (48.263 kg)  09/27/12 104 lb (47.174 kg)  09/10/12 113 lb 15.7 oz (51.7 kg)  08/27/12 117 lb 8.1 oz (53.3 kg)  08/16/12 114 lb (51.71 kg)  08/14/12 112 lb (50.803 kg)  07/28/12 113 lb 1.5 oz (51.3 kg)    Usual Body Weight: 113 lb 09/2012  % Usual Body Weight: 91%  BMI:  Body mass index is 17.06 kg/(m^2). Underweight  Estimated Nutritional Needs: Kcal: 1300-1500 Protein: 60-70 grams Fluid: > 1.5 L/day  Skin: no issues noted  Nutrition Focused Physical Exam:  Subcutaneous Fat:  Orbital Region: WNL Upper Arm Region: mild wasting Thoracic and Lumbar Region: mild wasting  Muscle:  Temple Region: moderate wasting Clavicle Bone Region: severe wasting Clavicle and Acromion Bone Region: severe wasting Scapular Bone Region: severe wasting Dorsal Hand: severe wasting Patellar Region: WNL Anterior Thigh Region: WNL Posterior Calf Region: WNL  Edema: not present   Diet Order: Dysphagia 1 with thin liquids Meal Completion:  80% this am   EDUCATION NEEDS: -No education needs identified at this time   Intake/Output Summary (Last 24 hours) at 12/13/12 0957 Last data filed at 12/13/12 0845  Gross per 24 hour  Intake   2240 ml  Output     10 ml  Net   2230 ml    Last BM: 2/8   Labs:   Recent Labs Lab 12/11/12 1245 12/13/12 0730  NA 137 140  K 4.9 4.3  CL 102 105  CO2  --  25  BUN 30* 18  CREATININE 1.30* 0.90  CALCIUM  --  8.0*  GLUCOSE 197* 118*    CBG (last  3)   Recent Labs  12/12/12 2228 12/12/12 2344 12/13/12 0657  GLUCAP 65* 174* 120*   Lab Results  Component Value Date   HGBA1C 8.8* 12/12/2012   Lipid Panel     Component Value Date/Time   CHOL 155 12/12/2012 0141   TRIG 128 12/12/2012 0141   HDL 84 12/12/2012 0141   CHOLHDL 1.8 12/12/2012 0141   VLDL 26 12/12/2012 0141   LDLCALC 45 12/12/2012 0141   Scheduled Meds: . insulin aspart  0-5 Units Subcutaneous QHS  . insulin aspart  0-9 Units Subcutaneous TID WC  . insulin detemir  6 Units Subcutaneous Q breakfast  . levETIRAcetam  250 mg Oral Q12H  . metoprolol tartrate  25 mg Oral BID  . multivitamin with minerals  1 tablet Oral Daily  . pantoprazole  40 mg Oral Daily  . vitamin C  500 mg Oral Daily    Continuous Infusions: . sodium chloride 100 mL/hr at 12/13/12 0000    Past Medical History  Diagnosis Date  . Diabetes mellitus   . GERD (gastroesophageal reflux disease)   . Seizures   . Cataract   . Anxiety   . Stroke     History reviewed. No pertinent past surgical history.  Kendell Bane RD, LDN, CNSC [phone] Pager [phone] After Hours Pager

## 2012-12-14 ENCOUNTER — Encounter (HOSPITAL_COMMUNITY): Payer: Self-pay | Admitting: Otolaryngology

## 2012-12-14 NOTE — Care Management Note (Signed)
    Page 1 of 1   12/14/2012     8:19:33 AM   CARE MANAGEMENT NOTE 12/14/2012  Patient:  [name] [name],[name] [name]   Account Number:  [id number]  Date Initiated:  12/14/2012  Documentation initiated by:  Jacquelynn Cree  Subjective/Objective Assessment:   admitted for TIA, dysphasia     Action/Plan:   PT/OT/ST evals- recommending HHPT and HHOT   Anticipated DC Date:     Anticipated DC Plan:        DC Planning Services  CM consult      Choice offered to / List presented to:  C-1 Patient        HH arranged  HH-2 PT  HH-3 OT      [facility] agency  [facility]   Status of service:  Completed, signed off Medicare Important Message given?   (If response is "NO", the following Medicare IM given date fields will be blank) Date Medicare IM given:   Date Additional Medicare IM given:    Discharge Disposition:  HOME W HOME HEALTH SERVICES  Per UR Regulation:  Reviewed for med. necessity/level of care/duration of stay  If discussed at Long Length of Stay Meetings, dates discussed:    Comments:  12/14/12 On 12/13/12 spoke with patient and her husband about HHC, they selected [facility]. Contacted Cynda Soule at [facility] and set up HHPT and HHOT. No equipment needs identified. Jacquelynn Cree RN, BSN, CCM

## 2012-12-14 NOTE — Discharge Summary (Signed)
Family Medicine Teaching Service  Discharge Note : Attending Ara Mano MD Pager [phone] Office [phone] I have seen and examined this patient, reviewed their chart and discussed discharge planning wit the resident at the time of discharge. I agree with the discharge plan as above.  

## 2012-12-21 ENCOUNTER — Telehealth: Payer: Self-pay | Admitting: Family Medicine

## 2012-12-21 NOTE — Telephone Encounter (Signed)
Spoke w/ husband, who said Earlene has not been doing well; eating very little.  He suspects she may have a virus.  They have no nutritional drinks on hand, but I recommended generic/store brand to save money.  He did not want to schedule for MNT right now, but will call for appt when Omaira feels up to it.

## 2013-02-22 ENCOUNTER — Ambulatory Visit (INDEPENDENT_AMBULATORY_CARE_PROVIDER_SITE_OTHER): Payer: Federal, State, Local not specified - PPO | Admitting: Nurse Practitioner

## 2013-02-22 ENCOUNTER — Encounter: Payer: Self-pay | Admitting: Nurse Practitioner

## 2013-02-22 VITALS — BP 106/64 | HR 85 | Ht 66.5 in | Wt 102.0 lb

## 2013-02-22 DIAGNOSIS — G40309 Generalized idiopathic epilepsy and epileptic syndromes, not intractable, without status epilepticus: Secondary | ICD-10-CM

## 2013-02-22 DIAGNOSIS — I619 Nontraumatic intracerebral hemorrhage, unspecified: Secondary | ICD-10-CM

## 2013-02-22 DIAGNOSIS — G40409 Other generalized epilepsy and epileptic syndromes, not intractable, without status epilepticus: Secondary | ICD-10-CM

## 2013-02-22 NOTE — Patient Instructions (Addendum)
History of right posterior hemorrhage possibly hypertensive etiology Continue aggressive treatment of hypertension with blood pressure below 130/90 Continues with visual field deficit, to get surgery in several weeks. Followup in 6 months

## 2013-02-22 NOTE — Progress Notes (Signed)
HPI: Returns for followup of her last visit with Dr. Pearlean Brownie 11/22/2012. She has a history of a small right parietal occipital hemorrhage without ventricular extension, hydrocephalus or mass effect. She had a seizure,  an EEG showed lateralized epileptiform discharges and she was started on Keppra. She also has a partial left-sided visual field cut on exam, she has done well since discharge and is back at her home . Her MRI 11/25/2012 shows expected evolutionary changes in the right posterior medial temporal lobe when compared to prior MRI scan. There are mild changes of chronic microvascular ischemia and generalized cerebral atrophy which are unchanged.  ROS:  chest pain, hearing loss, rash, itching, urination problems, insomnia, sleepiness, tremor, not enough sleep,  memory loss, confusion  Physical Exam General: well developed, well nourished, seated, in no evident distress Head: head normocephalic and atraumatic. Oropharynx benign Neck: supple with no carotid or supraclavicular bruits Cardiovascular: regular rate and rhythm, no murmurs  Neurologic Exam Mental Status: Awake and alert. Oriented to place and time. Follows all commands.  Mood and affect appropriate. Memory testing deferred  Cranial Nerves: Fundoscopic exam reveals sharp disc margins. Pupils equal, briskly reactive to light. Extraocular movements full without nystagmus. Visual fields show partial left homonymous hemianopsia  to confrontation. Hearing intact and symmetric to finger snap. Facial sensation intact. Face, tongue, palate move normally and symmetrically. Neck flexion and extension normal.  Motor: Normal bulk and tone. Normal strength in all tested extremity muscles. Sensory.: intact to touch and pinprick and vibratory.  Coordination: Rapid alternating movements normal in all extremities. Finger-to-nose and heel-to-shin performed accurately bilaterally. Gait and Station: Arises from chair without difficulty. Stance is normal.  Gait demonstrates normal stride length and balance . Able to heel, toe and unsteady with tandem walk.  Reflexes: 2+ and symmetric. Toes downgoing.     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 She is doing quite well with only mild cognitive impairment and partial left homonymous hemianopsia. Repeat MRI 11/25/2012 shows expected evolutionary changes of the blood products in the right posterior medial temporal lobe. There are mild changes of chronic microvascular ischemia and generalized cerebral atrophy which are unchanged     PLAN:  Continue aggressive treatment of hypertension with blood pressure below 130/90 Continues with visual field deficit, no driving. Followup in 6 months   Nilda Riggs, [facility] APRN

## 2013-03-02 ENCOUNTER — Encounter: Payer: Self-pay | Admitting: Internal Medicine

## 2013-06-22 ENCOUNTER — Emergency Department (HOSPITAL_COMMUNITY): Payer: Federal, State, Local not specified - PPO

## 2013-06-22 ENCOUNTER — Encounter (HOSPITAL_COMMUNITY): Payer: Self-pay | Admitting: Emergency Medicine

## 2013-06-22 ENCOUNTER — Emergency Department (HOSPITAL_COMMUNITY)
Admission: EM | Admit: 2013-06-22 | Discharge: 2013-06-22 | Disposition: A | Discharge status: Home / Self Care | Payer: Federal, State, Local not specified - PPO | Attending: Emergency Medicine | Admitting: Emergency Medicine

## 2013-06-22 DIAGNOSIS — K219 Gastro-esophageal reflux disease without esophagitis: Secondary | ICD-10-CM | POA: Insufficient documentation

## 2013-06-22 DIAGNOSIS — F411 Generalized anxiety disorder: Secondary | ICD-10-CM | POA: Insufficient documentation

## 2013-06-22 DIAGNOSIS — G40309 Generalized idiopathic epilepsy and epileptic syndromes, not intractable, without status epilepticus: Secondary | ICD-10-CM

## 2013-06-22 DIAGNOSIS — R231 Pallor: Secondary | ICD-10-CM | POA: Insufficient documentation

## 2013-06-22 DIAGNOSIS — G40409 Other generalized epilepsy and epileptic syndromes, not intractable, without status epilepticus: Secondary | ICD-10-CM

## 2013-06-22 DIAGNOSIS — I10 Essential (primary) hypertension: Secondary | ICD-10-CM

## 2013-06-22 DIAGNOSIS — R569 Unspecified convulsions: Secondary | ICD-10-CM | POA: Insufficient documentation

## 2013-06-22 DIAGNOSIS — E119 Type 2 diabetes mellitus without complications: Secondary | ICD-10-CM

## 2013-06-22 DIAGNOSIS — Z8673 Personal history of transient ischemic attack (TIA), and cerebral infarction without residual deficits: Secondary | ICD-10-CM | POA: Insufficient documentation

## 2013-06-22 DIAGNOSIS — Z794 Long term (current) use of insulin: Secondary | ICD-10-CM | POA: Insufficient documentation

## 2013-06-22 DIAGNOSIS — R55 Syncope and collapse: Secondary | ICD-10-CM | POA: Insufficient documentation

## 2013-06-22 DIAGNOSIS — R5381 Other malaise: Secondary | ICD-10-CM | POA: Insufficient documentation

## 2013-06-22 DIAGNOSIS — Z8669 Personal history of other diseases of the nervous system and sense organs: Secondary | ICD-10-CM | POA: Insufficient documentation

## 2013-06-22 DIAGNOSIS — R51 Headache: Secondary | ICD-10-CM

## 2013-06-22 DIAGNOSIS — H539 Unspecified visual disturbance: Secondary | ICD-10-CM | POA: Insufficient documentation

## 2013-06-22 DIAGNOSIS — Z79899 Other long term (current) drug therapy: Secondary | ICD-10-CM | POA: Insufficient documentation

## 2013-06-22 LAB — URINALYSIS, ROUTINE W REFLEX MICROSCOPIC
Ketones, ur: NEGATIVE mg/dL
Nitrite: NEGATIVE
Protein, ur: NEGATIVE mg/dL
pH: 5 (ref 5.0–8.0)

## 2013-06-22 LAB — CBC WITH DIFFERENTIAL/PLATELET
Basophils Absolute: 0 10*3/uL (ref 0.0–0.1)
Basophils Relative: 0 % (ref 0–1)
Eosinophils Absolute: 0 10*3/uL (ref 0.0–0.7)
Eosinophils Relative: 0 % (ref 0–5)
HCT: 36.3 % (ref 36.0–46.0)
Lymphocytes Relative: 34 % (ref 12–46)
MCH: 32.9 pg (ref 26.0–34.0)
MCHC: 34.4 g/dL (ref 30.0–36.0)
MCV: 95.5 fL (ref 78.0–100.0)
Monocytes Absolute: 1.3 10*3/uL — ABNORMAL HIGH (ref 0.1–1.0)
Platelets: 236 10*3/uL (ref 150–400)
RDW: 12.4 % (ref 11.5–15.5)

## 2013-06-22 LAB — COMPREHENSIVE METABOLIC PANEL
AST: 22 U/L (ref 0–37)
CO2: 22 mEq/L (ref 19–32)
Calcium: 8.3 mg/dL — ABNORMAL LOW (ref 8.4–10.5)
Creatinine, Ser: 1.2 mg/dL — ABNORMAL HIGH (ref 0.50–1.10)
GFR calc non Af Amer: 44 mL/min — ABNORMAL LOW (ref >90)
Sodium: 134 mEq/L — ABNORMAL LOW (ref 135–145)
Total Protein: 7.2 g/dL (ref 6.0–8.3)

## 2013-06-22 LAB — APTT: aPTT: 26 seconds (ref 24–37)

## 2013-06-22 LAB — SEDIMENTATION RATE: Sed Rate: 33 mm/hr — ABNORMAL HIGH (ref 0–22)

## 2013-06-22 LAB — PROTIME-INR: Prothrombin Time: 12.6 seconds (ref 11.6–15.2)

## 2013-06-22 LAB — RAPID URINE DRUG SCREEN, HOSP PERFORMED
Amphetamines: NOT DETECTED
Barbiturates: NOT DETECTED
Benzodiazepines: NOT DETECTED

## 2013-06-22 LAB — TROPONIN I: Troponin I: <0.3 ng/mL (ref <0.30)

## 2013-06-22 MED ORDER — DIPHENHYDRAMINE HCL 50 MG/ML IJ SOLN
25.0000 mg | Freq: Once | INTRAMUSCULAR | Status: AC
  Administered 2013-06-22: 25 mg via INTRAVENOUS
  Filled 2013-06-22: qty 1

## 2013-06-22 MED ORDER — METOCLOPRAMIDE HCL 5 MG/ML IJ SOLN
10.0000 mg | Freq: Once | INTRAMUSCULAR | Status: AC
  Administered 2013-06-22: 10 mg via INTRAVENOUS
  Filled 2013-06-22: qty 2

## 2013-06-22 MED ORDER — KETOROLAC TROMETHAMINE 30 MG/ML IJ SOLN
30.0000 mg | Freq: Once | INTRAMUSCULAR | Status: AC
  Administered 2013-06-22: 30 mg via INTRAVENOUS
  Filled 2013-06-22: qty 1

## 2013-06-22 NOTE — ED Notes (Signed)
Family at bedside. 

## 2013-06-22 NOTE — ED Notes (Signed)
At triage pt became syncopal and did not respond to nurse or tech pt  Became very pale  And had head down. Pt had pulse and thenupon putting her in bed pt came back around

## 2013-06-22 NOTE — Consult Note (Signed)
NEURO HOSPITALIST CONSULT NOTE    Reason for Consult: HA and left visual field cut  HPI:                                                                                                                                          [name] [name] is an 72 y.o. female who has been suffering bilateral temporal HA since Monday.  Patient went to see her opthalmologist today who noted she had a left field visual field cut and sent patient to the ED.  In speaking with the husband he noted she has had a left visual field cut since her previous stroke (2013).  Looking back in the notes from her previous records, patient was seen for right temporal-parietal hemorrhage in 2013 with resultant "left hemiparesis, left field cut, left hemisensory deficit, dysphagia" at that time. Patient denies any new visual difficulties but does complain of bilateral temporal HA rated at a 9/10 and throbbing.  Initial CT head was negative for acute bleed or stroke.    Past Medical History  Diagnosis Date  . Diabetes mellitus   . GERD (gastroesophageal reflux disease)   . Seizures   . Cataract   . Anxiety   . Stroke     Past Surgical History  Procedure Laterality Date  . 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 Cancer Mother   . Cancer Father   . Diabetes Paternal Grandmother      Social History:  reports that she has never smoked. She has never used smokeless tobacco. She reports that she does not drink alcohol or use illicit drugs.  Allergies  Allergen Reactions  . Barbiturates     Becomes restless.  Also with all "strong" medications like sleeping pills    MEDICATIONS:                                                                                                                     No current  facility-administered medications for this encounter.   Current Outpatient Prescriptions  Medication Sig Dispense Refill  .  acetaminophen (TYLENOL) 325 MG tablet Take 650 mg by mouth every 6 (six) hours as needed for pain.      Marland Kitchen atorvastatin (LIPITOR) 10 MG tablet Take 10 mg by mouth daily.       . B-D ULTRAFINE III SHORT PEN 31G X 8 MM MISC       . insulin detemir (LEVEMIR) 100 UNIT/ML injection Inject 8 Units into the skin daily with breakfast.       . levETIRAcetam (KEPPRA) 250 MG tablet Take 250 mg by mouth every 12 (twelve) hours.      . metFORMIN (GLUCOPHAGE) 500 MG tablet Take 1 tablet by mouth Twice daily with meals.      . metoprolol tartrate (LOPRESSOR) 25 MG tablet Take 25 mg by mouth 2 (two) times daily.      . Multiple Vitamin (MULTIVITAMIN WITH MINERALS) TABS Take 1 tablet by mouth daily.      Marland Kitchen omeprazole (PRILOSEC) 40 MG capsule Take 40 mg by mouth 2 (two) times daily.       . sitaGLIPtin (JANUVIA) 25 MG tablet Take 50 mg by mouth daily.      . vitamin C (ASCORBIC ACID) 500 MG tablet Take 500 mg by mouth daily.          ROS:                                                                                                                                       History obtained from the patient  General ROS: negative for - chills, fatigue, fever, night sweats, weight gain or weight loss Psychological ROS: negative for - behavioral disorder, hallucinations, memory difficulties, mood swings or suicidal ideation Ophthalmic ROS: negative for - blurry vision, double vision, eye pain or loss of vision ENT ROS: negative for - epistaxis, nasal discharge, oral lesions, sore throat, tinnitus or vertigo Allergy and Immunology ROS: negative for - hives or itchy/watery eyes Hematological and Lymphatic ROS: negative for - bleeding problems, bruising or swollen lymph nodes Endocrine ROS: negative for - galactorrhea, hair pattern changes, polydipsia/polyuria or temperature  intolerance Respiratory ROS: negative for - cough, hemoptysis, shortness of breath or wheezing Cardiovascular ROS: negative for - chest pain, dyspnea on exertion, edema or irregular heartbeat Gastrointestinal ROS: negative for - abdominal pain, diarrhea, hematemesis, nausea/vomiting or stool incontinence Genito-Urinary ROS: negative for - dysuria, hematuria, incontinence or urinary frequency/urgency Musculoskeletal ROS: negative for - joint swelling or muscular weakness Neurological ROS: as noted in HPI Dermatological ROS: negative for rash and skin lesion changes   Blood pressure 131/56, pulse 86, temperature 98.3 F (36.8 C), temperature source Oral, resp. rate 17, SpO2 99.00%.   Neurologic Examination:  Mental Status: Alert, oriented, thought content appropriate.  Speech fluent without evidence of aphasia.  Able to follow 3 step commands without difficulty. Cranial Nerves: II: Discs flat bilaterally; Visual fields grossly normal, pupils dilated and non reactive from opthalmology appointment today III,IV, VI: ptosis not present, extra-ocular motions intact bilaterally V,VII: smile symmetric, facial light touch sensation normal bilaterally VIII: hearing normal bilaterally IX,X: gag reflex present XI: bilateral shoulder shrug XII: midline tongue extension Motor: Right : Upper extremity   5/5    Left:     Upper extremity   5/5  Lower extremity   5/5     Lower extremity   5/5 Tone and bulk:normal tone throughout; no atrophy noted Sensory: Pinprick and light touch intact throughout, bilaterally Deep Tendon Reflexes:  Right: Upper Extremity   Left: Upper extremity   biceps (C-5 to C-6) 2/4   biceps (C-5 to C-6) 2/4 tricep (C7) 2/4    triceps (C7) 2/4 Brachioradialis (C6) 2/4  Brachioradialis (C6) 2/4  Lower Extremity Lower Extremity  quadriceps (L-2 to L-4) 2/4   quadriceps (L-2 to  L-4) 2/4 Achilles (S1) 1/4   Achilles (S1) 1/4  Plantars: Right: downgoing   Left: downgoing Cerebellar: normal finger-to-nose,  normal heel-to-shin test CV: pulses palpable throughout    Lab Results  Component Value Date/Time   CHOL 155 12/12/2012  1:41 AM    Results for orders placed during the hospital encounter of 06/22/13 (from the past 48 hour(s))  CBC WITH DIFFERENTIAL     Status: Abnormal   Collection Time    06/22/13  2:42 PM      Result Value Range   WBC 12.5 (*) 4.0 - 10.5 K/uL   RBC 3.80 (*) 3.87 - 5.11 MIL/uL   Hemoglobin 12.5  12.0 - 15.0 g/dL   HCT 16.1  09.6 - 04.5 %   MCV 95.5  78.0 - 100.0 fL   MCH 32.9  26.0 - 34.0 pg   MCHC 34.4  30.0 - 36.0 g/dL   RDW 40.9  81.1 - 91.4 %   Platelets 236  150 - 400 K/uL   Neutrophils Relative % 55  43 - 77 %   Neutro Abs 6.9  1.7 - 7.7 K/uL   Lymphocytes Relative 34  12 - 46 %   Lymphs Abs 4.3 (*) 0.7 - 4.0 K/uL   Monocytes Relative 10  3 - 12 %   Monocytes Absolute 1.3 (*) 0.1 - 1.0 K/uL   Eosinophils Relative 0  0 - 5 %   Eosinophils Absolute 0.0  0.0 - 0.7 K/uL   Basophils Relative 0  0 - 1 %   Basophils Absolute 0.0  0.0 - 0.1 K/uL  COMPREHENSIVE METABOLIC PANEL     Status: Abnormal   Collection Time    06/22/13  2:42 PM      Result Value Range   Sodium 134 (*) 135 - 145 mEq/L   Potassium 4.7  3.5 - 5.1 mEq/L   Chloride 96  96 - 112 mEq/L   CO2 22  19 - 32 mEq/L   Glucose, Bld 311 (*) 70 - 99 mg/dL   BUN 38 (*) 6 - 23 mg/dL   Creatinine, Ser 7.82 (*) 0.50 - 1.10 mg/dL   Calcium 8.3 (*) 8.4 - 10.5 mg/dL   Total Protein 7.2  6.0 - 8.3 g/dL   Albumin 3.7  3.5 - 5.2 g/dL   AST 22  0 - 37 U/L   ALT 16  0 -  35 U/L   Alkaline Phosphatase 92  39 - 117 U/L   Total Bilirubin 0.5  0.3 - 1.2 mg/dL   GFR calc non Af Amer 44 (*) >90 mL/min   GFR calc Af Amer 51 (*) >90 mL/min   Comment: (NOTE)     The eGFR has been calculated using the CKD EPI equation.     This calculation has not been validated in all clinical  situations.     eGFR's persistently <90 mL/min signify possible Chronic Kidney     Disease.    Ct Head Wo Contrast  06/22/2013   *RADIOLOGY REPORT*  Clinical Data: Headache  CT HEAD WITHOUT CONTRAST  Technique:  Contiguous axial images were obtained from the base of the skull through the vertex without contrast.  Comparison: MRI 12/12/2012.  Findings: No acute intracranial hemorrhage.  No focal mass lesion. No CT evidence of acute infarction.   No midline shift or mass effect.  No hydrocephalus.  Basilar cisterns are patent.  There is mild periventricular white matter hypodensities.  Mild encephalomalacia within the right occipital lobe. These findings are similar to comparison MRI.  Paranasal sinuses and mastoid air cells are clear.  Orbits are normal.  IMPRESSION:  1.  No acute intracranial findings. 2. 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   Original Report Authenticated By: Genevive Bi, M.D.     Assessment/Plan: 72 YO female with 3 days of bilateral temporal HA. CT head negative for acute bleed or new stroke.    Recommend: 1) MRI brain to rule out possible new stroke not visualized on CT 2) If MRI is negative, would symptomatically treat HA.   Assessment and plan discussed with with attending physician and they are in agreement.    Felicie Morn PA-C [facility] [phone]  I personally participated in this patient's evaluation and management including neurological examination as well as formulation of the above clinical impression and management recommendations.  Venetia Maxon M.D. [facility] [phone]  06/22/2013, 5:16 PM

## 2013-06-22 NOTE — ED Provider Notes (Signed)
Temporal headache X 4 - days, gradually worsening, sent to ED from there - CT recommended by Optho - ESR, CRP, not tender over the temples here, has normal strength and sensation of all 4 extremities, cranial nerves III through XII are intact, cranial nerve 2 is impaired secondary to her pupillary dilation which occurred at the ophthalmology office. Her speech is clear, memory seems to be intact, finger-nose-finger is impaired in her left visual fields, she appears to have a visual field cut on the left and both eyes left greater than right.  The patient did have a workup for stroke, neurology consultation  Dr. Roseanne Reno with neurology has recommended MRI of the brain to rule out acute stroke but he suspects that the visual defects are old. On my posterior exam I do not detect any retinal hemorrhages or detachments. Her pupils are significantly dilated after her ophthalmology visit. Otherwise he recommends treating for her headache and thinks this is all mostly headache.  Medical screening examination/treatment/procedure(s) were conducted as a shared visit with non-physician practitioner(s) and myself.  I personally evaluated the patient during the encounter.  Clinical Impression: Headache  Vida Roller, MD 06/27/13 [phone]

## 2013-06-22 NOTE — ED Notes (Signed)
Patient to CT at this time

## 2013-06-22 NOTE — ED Notes (Signed)
Patient to MRI at this time, patient in NAD

## 2013-06-22 NOTE — ED Notes (Signed)
H/a and increasing confusion x 4 days  Went to PCP today and saw eye dr Martyn Malay

## 2013-06-22 NOTE — ED Notes (Signed)
Patient returning from ct at this time, NAD noted, family at bedside

## 2013-06-22 NOTE — ED Provider Notes (Signed)
CSN: [id number]     Arrival date & time 06/22/13  1429 History     First MD Initiated Contact with Patient 06/22/13 1507     Chief Complaint  Patient presents with  . Headache   (Consider location/radiation/quality/duration/timing/severity/associated sxs/prior Treatment) Patient is a 72 y.o. female presenting with headaches.  Headache Pain location:  L temporal and R temporal Quality:  Stabbing Radiates to:  Does not radiate Severity at highest:  10/10 Onset quality:  Gradual Duration:  4 days Timing:  Constant Progression:  Worsening Chronicity:  New Similar to prior headaches: no   Relieved by:  None tried Worsened by:  Nothing tried Ineffective treatments:  None tried Associated symptoms: fatigue, syncope and visual change (L eye vision loss)   Associated symptoms: no abdominal pain, no back pain, no cough, no diarrhea, no dizziness, no pain, no facial pain, no fever, no focal weakness, no loss of balance, no nausea, no neck pain, no neck stiffness, no numbness, no photophobia, no tingling, no vomiting and no weakness   Syncope:    Duration:  30 seconds   Witnessed: yes (occurred while pt in triage- pt became pale;  return of consciousness when laid back on bed)     Suspicion of head trauma:  No Visual Change:    Location:  L eye   Quality: vision loss     Onset quality:  Gradual   Severity:  Moderate   Duration:  4 days   Timing:  Constant   Progression:  Unchanged   Chronicity:  New   Past Medical History  Diagnosis Date  . Diabetes mellitus   . GERD (gastroesophageal reflux disease)   . Seizures   . Cataract   . Anxiety   . Stroke    Past Surgical History  Procedure Laterality Date  . 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 Cancer Mother   . Cancer Father   .  Diabetes Paternal Grandmother    History  Substance Use Topics  . Smoking status: Never Smoker   . Smokeless tobacco: Never Used  . Alcohol Use: No     Comment: patient drinks cafnated drinks.    OB History   Grav Para Term Preterm Abortions TAB SAB Ect Mult Living                 Review of Systems  Constitutional: Positive for fatigue. Negative for fever, chills, diaphoresis and appetite change.  HENT: Negative for facial swelling, neck pain and neck stiffness.   Eyes: Positive for visual disturbance. Negative for photophobia, pain and redness.  Respiratory: Negative for cough, chest tightness, shortness of breath and wheezing.   Cardiovascular: Positive for syncope. Negative for chest pain, palpitations and leg swelling.  Gastrointestinal: Negative for nausea, vomiting, abdominal pain, diarrhea and abdominal distention.  Genitourinary: Negative for dysuria.  Musculoskeletal: Negative for back pain and gait problem.  Skin: Positive for pallor. Negative for rash.  Neurological: Positive for syncope and headaches. Negative for dizziness, focal weakness, facial asymmetry, speech difficulty, weakness, light-headedness, numbness and loss of balance.  All other systems reviewed and are negative.    Allergies  Barbiturates  Home Medications   Current Outpatient Rx  Name  Route  Sig  Dispense  Refill  . acetaminophen (TYLENOL) 325 MG tablet  Oral   Take 650 mg by mouth every 6 (six) hours as needed for pain.         Marland Kitchen atorvastatin (LIPITOR) 10 MG tablet   Oral   Take 10 mg by mouth daily.          . B-D ULTRAFINE III SHORT PEN 31G X 8 MM MISC               . insulin detemir (LEVEMIR) 100 UNIT/ML injection   Subcutaneous   Inject 8 Units into the skin daily with breakfast.          . levETIRAcetam (KEPPRA) 250 MG tablet   Oral   Take 250 mg by mouth every 12 (twelve) hours.         . metFORMIN (GLUCOPHAGE) 500 MG tablet   Oral   Take 1 tablet by mouth  Twice daily with meals.         . metoprolol tartrate (LOPRESSOR) 25 MG tablet   Oral   Take 25 mg by mouth 2 (two) times daily.         . Multiple Vitamin (MULTIVITAMIN WITH MINERALS) TABS   Oral   Take 1 tablet by mouth daily.         Marland Kitchen omeprazole (PRILOSEC) 40 MG capsule   Oral   Take 40 mg by mouth 2 (two) times daily.          . sitaGLIPtin (JANUVIA) 25 MG tablet   Oral   Take 50 mg by mouth daily.         . vitamin C (ASCORBIC ACID) 500 MG tablet   Oral   Take 500 mg by mouth daily.          BP 119/62  Pulse 98  Temp(Src) 98.3 F (36.8 C) (Oral)  Resp 19  SpO2 96% Physical Exam  Nursing note and vitals reviewed. Constitutional: She is oriented to person, place, and time. She appears well-developed and well-nourished. No distress.  HENT:  Head: Normocephalic and atraumatic.  No tenderness on palpation of temples  Eyes: EOM are normal. Pupils are equal, round, and reactive to light. Right eye exhibits normal extraocular motion. Left eye exhibits normal extraocular motion.  Fundoscopic exam:      The right eye shows no exudate, no hemorrhage and no papilledema.       The left eye shows no exudate, no hemorrhage and no papilledema.  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  - Pupils dilated bilaterally 2/2 dilation during ophtho appt   Neck: Normal range of motion. Neck supple.  Cardiovascular: Normal rate, normal heart sounds and intact distal pulses.   Pulmonary/Chest: Effort normal and breath sounds normal. No respiratory distress. She has no wheezes. She has no rales.  Abdominal: Soft. Bowel sounds are normal. She exhibits no distension. There is no tenderness. There is no rebound and no guarding.  Musculoskeletal: Normal range of motion. She exhibits no edema and no tenderness.  Neurological: She is alert and oriented to person, place, and time. She has normal strength. A cranial nerve deficit and sensory deficit (L LE) is present. She exhibits normal muscle  tone. She displays a negative Romberg sign. Coordination normal. GCS eye subscore is 4. GCS verbal subscore is 5. GCS motor subscore is 6.  Finger-to-nose impaired on left, likely secondary to visual defect.  Pt also reports loss of sensation in LLE.    Skin: Skin is warm and dry. No rash noted. She is not diaphoretic.  ED Course   Procedures (including critical care time)  Labs Reviewed  CBC WITH DIFFERENTIAL - Abnormal; Notable for the following:    WBC 12.5 (*)    RBC 3.80 (*)    Lymphs Abs 4.3 (*)    Monocytes Absolute 1.3 (*)    All other components within normal limits  COMPREHENSIVE METABOLIC PANEL - Abnormal; Notable for the following:    Sodium 134 (*)    Glucose, Bld 311 (*)    BUN 38 (*)    Creatinine, Ser 1.20 (*)    Calcium 8.3 (*)    GFR calc non Af Amer 44 (*)    GFR calc Af Amer 51 (*)    All other components within normal limits  C-REACTIVE PROTEIN - Abnormal; Notable for the following:    CRP 0.6 (*)    All other components within normal limits  SEDIMENTATION RATE - Abnormal; Notable for the following:    Sed Rate 33 (*)    All other components within normal limits  URINALYSIS, ROUTINE W REFLEX MICROSCOPIC - Abnormal; Notable for the following:    Leukocytes, UA SMALL (*)    All other components within normal limits  URINE MICROSCOPIC-ADD ON - Abnormal; Notable for the following:    Squamous Epithelial / LPF FEW (*)    Casts HYALINE CASTS (*)    All other components within normal limits  TROPONIN I  PROTIME-INR  APTT  ETHANOL  URINE RAPID DRUG SCREEN (HOSP PERFORMED)   Ct Head Wo Contrast  06/22/2013   *RADIOLOGY REPORT*  Clinical Data: Headache  CT HEAD WITHOUT CONTRAST  Technique:  Contiguous axial images were obtained from the base of the skull through the vertex without contrast.  Comparison: MRI 12/12/2012.  Findings: No acute intracranial hemorrhage.  No focal mass lesion. No CT evidence of acute infarction.   No midline shift or mass effect.  No  hydrocephalus.  Basilar cisterns are patent.  There is mild periventricular white matter hypodensities.  Mild encephalomalacia within the right occipital lobe. These findings are similar to comparison MRI.  Paranasal sinuses and mastoid air cells are clear.  Orbits are normal.  IMPRESSION:  1.  No acute intracranial findings. 2. 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   Original Report Authenticated By: Genevive Bi, M.D.   [name]  06/22/2013   *RADIOLOGY REPORT*  Clinical Data: Headache.  Left visual field cut.  MRI HEAD WITHOUT CONTRAST  Technique:  Multiplanar, multiecho pulse sequences of the brain and surrounding structures were obtained according to standard protocol without intravenous contrast.  Comparison: Head CT same day.  MRI 12/12/2012.  Findings: Diffusion imaging does not show any acute or subacute infarction.  The brainstem and cerebellum are unremarkable.  The cerebral hemispheres show moderate chronic small vessel change affecting the deep and subcortical white matter. There is an old hemorrhagic infarction in the right posteromedial temporal lobe. This was present previously and could explain the patient's symptoms.  No mass lesion, hemorrhage, hydrocephalus or extra-axial collection.  No pituitary mass.  No inflammatory sinus disease. There is a left nasal maxillary cyst which could emanate from a dental root.  IMPRESSION: No acute infarction.  Moderate chronic small vessel changes of the cerebral hemispheric white matter. Old  hemorrhagic infarction in the right posteromedial temporal lobe that could effect the optic radiations on the right.   Original Report Authenticated By: Paulina Fusi, M.D.  1. Headache      Date: 06/22/2013  Rate: 85  Rhythm: normal sinus rhythm  QRS Axis: normal  Intervals: normal  ST/T Wave abnormalities: normal  Conduction Disutrbances:none  Narrative  Interpretation:   Old EKG Reviewed: unchanged   MDM  72 y.o. F with a PMH significant for CVA and DM presenting from ophthalmologist office due to HA x 4 days, L visual field defect, and AMS.  Pt has had HA for the past 4 days, gradual in onset but increasing in intensity.  Due to worsening pain, pt went to her PCP today and from there was sent to her ophthalmologist.  Ophthalmologist noted L homonymous defect with questionable R-sided homonymous defect as well and sent pt to ED with recommendation for CT head, ESR, CRP.  Per husband, pt's prior stroke presented in same manner.  While in triage, pt had syncopal event in which she became pale and then unreponsive for a few seconds, aroused again when she laid head back on bed.  Since then pt has been lethargic but oriented, VSS.  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  Pupils dilated bilaterally 2/2 dilation during ophtho appt. Strength equal in all extremities.  Possible neglect in LUE- pt believed she was making fist with left hand when she was not, however was able to after repeating instructions several times.  Finger-to-nose impaired on left, likely secondary to visual defect.  Pt also reports loss of sensation in LLE.    CT head, ESR, CRP, EKG ordered.  Plan for neuro consult.  Doubt temporal arteritis with no tenderness to palpation over temporal artery.  CT head shows remote occipital infarct, nothing acute. Discussed with Neuro- recommend MRI brain to r/o acute CVA although he has low suspicion for acute infarct and suspects symptoms 2/2 HA.  Advises treating HA symptomatically.  Per neuro, if MRI without acute change and HA improved, pt stable for discharge.  20:50:  MRI shows no acute infarcts. Pt re-evaluated and reporting symptomatic resolution.  Stable for discharge.   Advised close f/u with PCP and ophthalmology, and ED return precautions given.  Discussed with attending Dr. Hyacinth Meeker.      Jodean Lima, MD 06/23/13 [phone]

## 2013-06-25 ENCOUNTER — Inpatient Hospital Stay (HOSPITAL_COMMUNITY): Payer: Medicare Other

## 2013-06-25 ENCOUNTER — Inpatient Hospital Stay (HOSPITAL_COMMUNITY)
Admission: EM | Admit: 2013-06-25 | Discharge: 2013-07-08 | DRG: 100 | Disposition: A | Discharge status: Skilled Nursing Facility | Payer: Medicare Other | Attending: Family Medicine | Admitting: Family Medicine

## 2013-06-25 ENCOUNTER — Emergency Department (HOSPITAL_COMMUNITY): Payer: Medicare Other

## 2013-06-25 ENCOUNTER — Encounter (HOSPITAL_COMMUNITY): Payer: Self-pay

## 2013-06-25 DIAGNOSIS — R29818 Other symptoms and signs involving the nervous system: Secondary | ICD-10-CM

## 2013-06-25 DIAGNOSIS — E43 Unspecified severe protein-calorie malnutrition: Secondary | ICD-10-CM | POA: Diagnosis present

## 2013-06-25 DIAGNOSIS — G459 Transient cerebral ischemic attack, unspecified: Secondary | ICD-10-CM

## 2013-06-25 DIAGNOSIS — Z8673 Personal history of transient ischemic attack (TIA), and cerebral infarction without residual deficits: Secondary | ICD-10-CM

## 2013-06-25 DIAGNOSIS — R509 Fever, unspecified: Secondary | ICD-10-CM

## 2013-06-25 DIAGNOSIS — G40802 Other epilepsy, not intractable, without status epilepticus: Principal | ICD-10-CM | POA: Diagnosis present

## 2013-06-25 DIAGNOSIS — R4020 Unspecified coma: Secondary | ICD-10-CM | POA: Diagnosis present

## 2013-06-25 DIAGNOSIS — I69919 Unspecified symptoms and signs involving cognitive functions following unspecified cerebrovascular disease: Secondary | ICD-10-CM

## 2013-06-25 DIAGNOSIS — R000 Tachycardia, unspecified: Secondary | ICD-10-CM | POA: Diagnosis present

## 2013-06-25 DIAGNOSIS — B370 Candidal stomatitis: Secondary | ICD-10-CM | POA: Diagnosis present

## 2013-06-25 DIAGNOSIS — I10 Essential (primary) hypertension: Secondary | ICD-10-CM | POA: Diagnosis present

## 2013-06-25 DIAGNOSIS — I129 Hypertensive chronic kidney disease with stage 1 through stage 4 chronic kidney disease, or unspecified chronic kidney disease: Secondary | ICD-10-CM | POA: Diagnosis present

## 2013-06-25 DIAGNOSIS — N183 Chronic kidney disease, stage 3 unspecified: Secondary | ICD-10-CM | POA: Diagnosis present

## 2013-06-25 DIAGNOSIS — R110 Nausea: Secondary | ICD-10-CM

## 2013-06-25 DIAGNOSIS — J381 Polyp of vocal cord and larynx: Secondary | ICD-10-CM

## 2013-06-25 DIAGNOSIS — I69119 Unspecified symptoms and signs involving cognitive functions following nontraumatic intracerebral hemorrhage: Secondary | ICD-10-CM

## 2013-06-25 DIAGNOSIS — H53462 Homonymous bilateral field defects, left side: Secondary | ICD-10-CM

## 2013-06-25 DIAGNOSIS — Z452 Encounter for adjustment and management of vascular access device: Secondary | ICD-10-CM

## 2013-06-25 DIAGNOSIS — E118 Type 2 diabetes mellitus with unspecified complications: Secondary | ICD-10-CM | POA: Diagnosis present

## 2013-06-25 DIAGNOSIS — D72829 Elevated white blood cell count, unspecified: Secondary | ICD-10-CM | POA: Diagnosis present

## 2013-06-25 DIAGNOSIS — R1310 Dysphagia, unspecified: Secondary | ICD-10-CM

## 2013-06-25 DIAGNOSIS — G8194 Hemiplegia, unspecified affecting left nondominant side: Secondary | ICD-10-CM

## 2013-06-25 DIAGNOSIS — E119 Type 2 diabetes mellitus without complications: Secondary | ICD-10-CM

## 2013-06-25 DIAGNOSIS — G40409 Other generalized epilepsy and epileptic syndromes, not intractable, without status epilepticus: Secondary | ICD-10-CM

## 2013-06-25 DIAGNOSIS — M25552 Pain in left hip: Secondary | ICD-10-CM

## 2013-06-25 DIAGNOSIS — E872 Acidosis, unspecified: Secondary | ICD-10-CM | POA: Diagnosis present

## 2013-06-25 DIAGNOSIS — J690 Pneumonitis due to inhalation of food and vomit: Secondary | ICD-10-CM | POA: Diagnosis present

## 2013-06-25 DIAGNOSIS — I619 Nontraumatic intracerebral hemorrhage, unspecified: Secondary | ICD-10-CM | POA: Diagnosis present

## 2013-06-25 DIAGNOSIS — E876 Hypokalemia: Secondary | ICD-10-CM | POA: Diagnosis present

## 2013-06-25 DIAGNOSIS — R55 Syncope and collapse: Secondary | ICD-10-CM

## 2013-06-25 DIAGNOSIS — G40309 Generalized idiopathic epilepsy and epileptic syndromes, not intractable, without status epilepticus: Secondary | ICD-10-CM | POA: Diagnosis present

## 2013-06-25 DIAGNOSIS — K219 Gastro-esophageal reflux disease without esophagitis: Secondary | ICD-10-CM

## 2013-06-25 DIAGNOSIS — R51 Headache: Secondary | ICD-10-CM

## 2013-06-25 DIAGNOSIS — R569 Unspecified convulsions: Secondary | ICD-10-CM | POA: Diagnosis present

## 2013-06-25 DIAGNOSIS — S06300A Unspecified focal traumatic brain injury without loss of consciousness, initial encounter: Secondary | ICD-10-CM | POA: Diagnosis present

## 2013-06-25 DIAGNOSIS — J9601 Acute respiratory failure with hypoxia: Secondary | ICD-10-CM

## 2013-06-25 DIAGNOSIS — F411 Generalized anxiety disorder: Secondary | ICD-10-CM | POA: Diagnosis not present

## 2013-06-25 DIAGNOSIS — Z681 Body mass index (BMI) 19 or less, adult: Secondary | ICD-10-CM

## 2013-06-25 DIAGNOSIS — G9340 Encephalopathy, unspecified: Secondary | ICD-10-CM

## 2013-06-25 LAB — CSF CELL COUNT WITH DIFFERENTIAL
Eosinophils, CSF: 0 % (ref 0–1)
WBC, CSF: 1 /mm3 (ref 0–5)

## 2013-06-25 LAB — CBC WITH DIFFERENTIAL/PLATELET
Eosinophils Absolute: 0 10*3/uL (ref 0.0–0.7)
Eosinophils Relative: 0 % (ref 0–5)
Hemoglobin: 12.4 g/dL (ref 12.0–15.0)
Lymphs Abs: 3.3 10*3/uL (ref 0.7–4.0)
MCH: 32.5 pg (ref 26.0–34.0)
MCV: 96.6 fL (ref 78.0–100.0)
Monocytes Absolute: 1 10*3/uL (ref 0.1–1.0)
Monocytes Relative: 8 % (ref 3–12)
RBC: 3.82 MIL/uL — ABNORMAL LOW (ref 3.87–5.11)

## 2013-06-25 LAB — URINALYSIS, ROUTINE W REFLEX MICROSCOPIC
Bilirubin Urine: NEGATIVE
Ketones, ur: NEGATIVE mg/dL
Nitrite: NEGATIVE
Protein, ur: NEGATIVE mg/dL
Specific Gravity, Urine: 1.016 (ref 1.005–1.030)
Urobilinogen, UA: 0.2 mg/dL (ref 0.0–1.0)

## 2013-06-25 LAB — POCT I-STAT 3, ART BLOOD GAS (G3+)
Patient temperature: 98.6
pH, Arterial: 7.327 — ABNORMAL LOW (ref 7.350–7.450)

## 2013-06-25 LAB — POCT I-STAT TROPONIN I: Troponin i, poc: 0.02 ng/mL (ref 0.00–0.08)

## 2013-06-25 LAB — GLUCOSE, CAPILLARY
Glucose-Capillary: 267 mg/dL — ABNORMAL HIGH (ref 70–99)
Glucose-Capillary: 188 mg/dL — ABNORMAL HIGH (ref 70–99)
Glucose-Capillary: 118 mg/dL — ABNORMAL HIGH (ref 70–99)

## 2013-06-25 LAB — COMPREHENSIVE METABOLIC PANEL
Albumin: 3.6 g/dL (ref 3.5–5.2)
BUN: 31 mg/dL — ABNORMAL HIGH (ref 6–23)
Calcium: 8.8 mg/dL (ref 8.4–10.5)
Creatinine, Ser: 1.3 mg/dL — ABNORMAL HIGH (ref 0.50–1.10)
Potassium: 3.8 mEq/L (ref 3.5–5.1)
Total Protein: 7.4 g/dL (ref 6.0–8.3)

## 2013-06-25 LAB — GRAM STAIN

## 2013-06-25 LAB — PROTEIN AND GLUCOSE, CSF
Glucose, CSF: 124 mg/dL — ABNORMAL HIGH (ref 43–76)
Total  Protein, CSF: 37 mg/dL (ref 15–45)

## 2013-06-25 LAB — LACTIC ACID, PLASMA: Lactic Acid, Venous: 1.1 mmol/L (ref 0.5–2.2)

## 2013-06-25 MED ORDER — ACETAMINOPHEN 325 MG PO TABS: 650.0000 mg | ORAL_TABLET | Freq: Four times a day (QID) | ORAL | Status: DC | PRN

## 2013-06-25 MED ORDER — ONDANSETRON HCL 4 MG/2ML IJ SOLN: 4.0000 mg | Freq: Four times a day (QID) | INTRAMUSCULAR | Status: DC | PRN

## 2013-06-25 MED ORDER — DEXAMETHASONE SODIUM PHOSPHATE 4 MG/ML IJ SOLN
6.0000 mg | Freq: Four times a day (QID) | INTRAMUSCULAR | Status: DC
  Administered 2013-06-25 – 2013-06-26 (×4): 6 mg via INTRAVENOUS
  Filled 2013-06-25 (×7): qty 1.5

## 2013-06-25 MED ORDER — LEVOFLOXACIN IN D5W 500 MG/100ML IV SOLN: 500.0000 mg | INTRAVENOUS | Status: DC

## 2013-06-25 MED ORDER — ATORVASTATIN CALCIUM 10 MG PO TABS
10.0000 mg | ORAL_TABLET | Freq: Every day | ORAL | Status: DC
  Filled 2013-06-25 (×2): qty 1

## 2013-06-25 MED ORDER — ADULT MULTIVITAMIN W/MINERALS CH
1.0000 | ORAL_TABLET | Freq: Every day | ORAL | Status: DC
  Filled 2013-06-25 (×2): qty 1

## 2013-06-25 MED ORDER — VANCOMYCIN HCL IN DEXTROSE 1-5 GM/200ML-% IV SOLN
1000.0000 mg | Freq: Once | INTRAVENOUS | Status: AC
  Administered 2013-06-25: 1000 mg via INTRAVENOUS
  Filled 2013-06-25: qty 200

## 2013-06-25 MED ORDER — INSULIN DETEMIR 100 UNIT/ML ~~LOC~~ SOLN
8.0000 [IU] | Freq: Every day | SUBCUTANEOUS | Status: DC
  Filled 2013-06-25 (×2): qty 0.08

## 2013-06-25 MED ORDER — SODIUM CHLORIDE 0.9 % IJ SOLN
3.0000 mL | Freq: Two times a day (BID) | INTRAMUSCULAR | Status: DC
  Administered 2013-06-25 – 2013-07-04 (×9): 3 mL via INTRAVENOUS

## 2013-06-25 MED ORDER — SODIUM CHLORIDE 0.9 % IV SOLN
500.0000 mg | Freq: Two times a day (BID) | INTRAVENOUS | Status: DC
  Administered 2013-06-25 – 2013-06-28 (×7): 500 mg via INTRAVENOUS
  Filled 2013-06-25 (×10): qty 5

## 2013-06-25 MED ORDER — METOPROLOL TARTRATE 1 MG/ML IV SOLN
5.0000 mg | INTRAVENOUS | Status: DC | PRN
  Administered 2013-06-25 (×2): 5 mg via INTRAVENOUS
  Filled 2013-06-25 (×2): qty 5

## 2013-06-25 MED ORDER — DEXTROSE 5 % IV SOLN
2.0000 g | Freq: Two times a day (BID) | INTRAVENOUS | Status: DC
  Administered 2013-06-25 – 2013-06-26 (×2): 2 g via INTRAVENOUS
  Filled 2013-06-25 (×4): qty 2

## 2013-06-25 MED ORDER — ACETAMINOPHEN 650 MG RE SUPP
650.0000 mg | Freq: Four times a day (QID) | RECTAL | Status: DC | PRN
  Administered 2013-06-25 – 2013-07-01 (×4): 650 mg via RECTAL
  Filled 2013-06-25 (×8): qty 1

## 2013-06-25 MED ORDER — PANTOPRAZOLE SODIUM 40 MG PO TBEC: 40.0000 mg | DELAYED_RELEASE_TABLET | Freq: Every day | ORAL | Status: DC

## 2013-06-25 MED ORDER — CIPROFLOXACIN IN D5W 400 MG/200ML IV SOLN
400.0000 mg | INTRAVENOUS | Status: DC
  Filled 2013-06-25: qty 200

## 2013-06-25 MED ORDER — VANCOMYCIN HCL 500 MG IV SOLR
500.0000 mg | INTRAVENOUS | Status: DC
  Filled 2013-06-25: qty 500

## 2013-06-25 MED ORDER — SODIUM CHLORIDE 0.9 % IV BOLUS (SEPSIS)
250.0000 mL | Freq: Once | INTRAVENOUS | Status: AC
  Administered 2013-06-25: 250 mL via INTRAVENOUS

## 2013-06-25 MED ORDER — INSULIN ASPART 100 UNIT/ML ~~LOC~~ SOLN: 0.0000 [IU] | Freq: Three times a day (TID) | SUBCUTANEOUS | Status: DC

## 2013-06-25 MED ORDER — LEVOFLOXACIN IN D5W 750 MG/150ML IV SOLN
750.0000 mg | Freq: Once | INTRAVENOUS | Status: AC
  Administered 2013-06-25: 750 mg via INTRAVENOUS
  Filled 2013-06-25: qty 150

## 2013-06-25 MED ORDER — SODIUM CHLORIDE 0.9 % IV SOLN
1000.0000 mg | Freq: Once | INTRAVENOUS | Status: AC
  Administered 2013-06-25: 1000 mg via INTRAVENOUS
  Filled 2013-06-25: qty 10

## 2013-06-25 MED ORDER — SODIUM CHLORIDE 0.9 % IV SOLN
INTRAVENOUS | Status: DC
  Administered 2013-06-25 – 2013-06-26 (×4): via INTRAVENOUS

## 2013-06-25 MED ORDER — VITAMIN C 500 MG PO TABS
500.0000 mg | ORAL_TABLET | Freq: Every day | ORAL | Status: DC
  Filled 2013-06-25 (×2): qty 1

## 2013-06-25 MED ORDER — ONDANSETRON HCL 4 MG PO TABS: 4.0000 mg | ORAL_TABLET | Freq: Four times a day (QID) | ORAL | Status: DC | PRN

## 2013-06-25 MED ORDER — INSULIN ASPART 100 UNIT/ML ~~LOC~~ SOLN
0.0000 [IU] | SUBCUTANEOUS | Status: DC
  Administered 2013-06-25: 3 [IU] via SUBCUTANEOUS
  Administered 2013-06-26 (×2): 2 [IU] via SUBCUTANEOUS
  Administered 2013-06-26: 5 [IU] via SUBCUTANEOUS
  Administered 2013-06-26 – 2013-06-27 (×2): 2 [IU] via SUBCUTANEOUS

## 2013-06-25 MED ORDER — ACETAMINOPHEN 650 MG RE SUPP
650.0000 mg | Freq: Once | RECTAL | Status: AC
  Administered 2013-06-25: 650 mg via RECTAL

## 2013-06-25 MED ORDER — SODIUM CHLORIDE 0.9 % IV BOLUS (SEPSIS)
500.0000 mL | Freq: Once | INTRAVENOUS | Status: AC
  Administered 2013-06-25: 500 mL via INTRAVENOUS

## 2013-06-25 MED ORDER — LIDOCAINE HCL (PF) 1 % IJ SOLN
INTRAMUSCULAR | Status: AC
  Administered 2013-06-25: 0.5 mL
  Filled 2013-06-25: qty 5

## 2013-06-25 MED ORDER — SODIUM CHLORIDE 0.9 % IV SOLN
1.0000 g | Freq: Two times a day (BID) | INTRAVENOUS | Status: DC
  Administered 2013-06-25 – 2013-06-26 (×2): 1 g via INTRAVENOUS
  Filled 2013-06-25 (×3): qty 1000

## 2013-06-25 MED ORDER — INSULIN ASPART 100 UNIT/ML ~~LOC~~ SOLN: 0.0000 [IU] | Freq: Every day | SUBCUTANEOUS | Status: DC

## 2013-06-25 MED ORDER — METOPROLOL TARTRATE 25 MG PO TABS
25.0000 mg | ORAL_TABLET | Freq: Two times a day (BID) | ORAL | Status: DC
  Filled 2013-06-25 (×4): qty 1

## 2013-06-25 NOTE — Progress Notes (Addendum)
ANTIBIOTIC CONSULT NOTE - INITIAL  Pharmacy Consult for levofloxacin, vancomycin, and ceftriaxone Indication: suspected PNA and CNS infection  Allergies  Allergen Reactions  . Barbiturates     Becomes restless.  Also with all "strong" medications like sleeping pills  . Latex Itching    Patient Measurements: Height: 5\' 5"  (165.1 cm) Weight: 100 lb 15.5 oz (45.8 kg) IBW/kg (Calculated) : 57   Vital Signs: Temp: 103.1 F (39.5 C) (08/23 1750) Temp src: Rectal (08/23 1750) BP: 159/74 mmHg (08/23 1550) Pulse Rate: 116 (08/23 1750) Intake/Output from previous day:   Intake/Output from this shift: Total I/O In: 75 [I.V.:75] Out: 550 [Urine:550]  Labs:  Recent Labs  06/25/13 0741  WBC 12.2*  HGB 12.4  PLT 243  CREATININE 1.30*   Estimated Creatinine Clearance: 28.3 ml/min (by C-G formula based on Cr of 1.3). No results found for this basename: VANCOTROUGH, Leodis Binet, VANCORANDOM, GENTTROUGH, GENTPEAK, GENTRANDOM, TOBRATROUGH, TOBRAPEAK, TOBRARND, AMIKACINPEAK, AMIKACINTROU, AMIKACIN,  in the last 72 hours   Microbiology: Recent Results (from the past 720 hour(s))  MRSA PCR SCREENING     Status: None   Collection Time    06/25/13  1:25 PM      Result Value Range Status   MRSA by PCR NEGATIVE  NEGATIVE Final   Comment:            The GeneXpert MRSA Assay (FDA     approved for NASAL specimens     only), is one component of a     comprehensive MRSA colonization     surveillance program. It is not     intended to diagnose MRSA     infection nor to guide or     monitor treatment for     MRSA infections.    Medical History: Past Medical History  Diagnosis Date  . Diabetes mellitus   . GERD (gastroesophageal reflux disease)   . Seizures   . Cataract   . Anxiety   . Stroke     Assessment: 24 YOF with persistent fevers. Patient recently hospitalized and starting HCAP coverage. Also concerned for CNS infection as patient has new onset seizures. LP to be done.  Blood cultures drawn. WBC 12.2, Tmax 103.1. SCr 1.3, CrCl ~60mL/min.  Goal of Therapy:  Vancomycin trough level 15-20 mcg/ml  Plan:  1. Start levofloxacin 750mg  IV x1, then 500mg  Q48h starting 8/25 2. Start ceftriaxone 2g IV Q12h 3. Vancomycin loading dose with 1000mg  IV x1 4. Vancomycin maintenance dose of 500mg  IV Q24h 4. Follow up cultures/sensitivites, WBC, fevers, renal function, trough when indicated  Freman Lapage D. Senai Ramnath, PharmD Clinical Pharmacist Pager: [phone] 06/25/2013 6:17 PM

## 2013-06-25 NOTE — ED Provider Notes (Signed)
Medical screening examination/treatment/procedure(s) were conducted as a shared visit with non-physician practitioner(s) and myself.  I personally evaluated the patient during the encounter  Patient here with seizure. Recent workup for headache including MRI a few days ago. Supposed to be on Keppra. Patient had seizures today. Protecting airway, intermittently responsive. MAE. Has R eye gaze deviation at times. Unable to discern if post-ictal vs having new stroke. Head CT without hemorrhage. Patient admitted to medicine, Neuro consulted.   Dagmar Hait, MD 06/25/13 1440

## 2013-06-25 NOTE — ED Notes (Addendum)
Pt with another witnessed seizure lasting approximately 45 seconds.  Pt with eyes deviated left during seizure.  Post seizure eyes deviated right and snoring respirations.  PA  Notified.  EDP notified.

## 2013-06-25 NOTE — ED Notes (Signed)
Pt placed back on monitors.  Pt sleeping at this time.  Family at bedside.

## 2013-06-25 NOTE — ED Notes (Signed)
Called CT.  They are on the way to get pt for CT scan.

## 2013-06-25 NOTE — H&P (Signed)
[facility] History and Physical  [name] [name] MVH:[id number] DOB: 1941-04-28 DOA: 06/25/2013  Referring physician: Emergency Department PCP: Aida Puffer, MD  Specialists: Dr. Pearlean Brownie  Chief Complaint: Seizures  HPI: [name] [name] is a 72 y.o. female  With a hx of previous intracranial hemorrhage, seizures, htn, and diabetes who presents to the ED with recurrent seizures. The patient presented to the ED on 8/20 with complaints of a headache lasting several days. This was associated with a left visual field cut. Neurology was consulted, and per recs, the patient underwent an MRI of the brain which was unremarkable. The patient was discharged home from the ED, treated symptomatically for her headache. On the morning of admission, the patient was noted to have a witnessed seizure. EMS was initially called. Upon EMS arrival, the patient was alert/oriented and the family refused transport. During this time, the pt had another seizure and was then transported to the ED. In the ED, the pt had another witnessed seizure lasting about 45sec.A CT head was done, which redemonstrated the known remote hmorrhagic infarct, without acute changes. Neurology was consulted and the hospitalists were consulted for admission.  Review of Systems:  Cannot obtain given patient's level of alertness  Past Medical History  Diagnosis Date  . Diabetes mellitus   . GERD (gastroesophageal reflux disease)   . Seizures   . Cataract   . Anxiety   . Stroke    Past Surgical History  Procedure Laterality Date  . 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 She has never used smokeless tobacco. She reports that she does not drink alcohol or use illicit drugs.  where does patient live--home, ALF, SNF? and with whom if at home?  Can patient  participate in ADLs?  Allergies  Allergen Reactions  . Barbiturates     Becomes restless.  Also with all "strong" medications like sleeping pills  . Latex Itching    Family History  Problem Relation Age of Onset  . Cancer Mother   . Cancer Father   . Diabetes Paternal Grandmother     (be sure to complete)  Prior to Admission medications   Medication Sig Start Date End Date Taking? Authorizing Provider  acetaminophen (TYLENOL) 325 MG tablet Take 650 mg by mouth every 6 (six) hours as needed for pain.   Yes Historical Provider, MD  atorvastatin (LIPITOR) 10 MG tablet Take 10 mg by mouth daily.  02/11/13  Yes Historical Provider, MD  B-D ULTRAFINE III SHORT PEN 31G X 8 MM MISC  12/07/12  Yes Historical Provider, MD  insulin detemir (LEVEMIR) 100 UNIT/ML injection Inject 8 Units into the skin daily with breakfast.  09/10/12  Yes Evlyn Kanner Love, PA-C  levETIRAcetam (KEPPRA) 250 MG tablet Take 250 mg by mouth every 12 (twelve) hours.   Yes Historical Provider, MD  metFORMIN (GLUCOPHAGE) 500 MG tablet Take 1 tablet by mouth Twice daily with meals. 10/05/12  Yes Historical Provider, MD  metoprolol tartrate (LOPRESSOR) 25 MG tablet Take 25 mg by mouth 2 (two) times daily.   Yes Historical Provider, MD  Multiple Vitamin (MULTIVITAMIN WITH MINERALS) TABS Take 1 tablet by mouth daily.   Yes Historical Provider, MD  omeprazole (PRILOSEC) 40 MG capsule Take 40 mg by mouth 2 (two) times daily.    Yes Historical Provider, MD  sitaGLIPtin (JANUVIA) 25 MG tablet Take 50 mg by mouth daily.   Yes Historical Provider, MD  SUMAtriptan (IMITREX) 25 MG tablet Take 25 mg by mouth every 2 (two) hours as needed for migraine.   Yes Historical Provider, MD  vitamin C (ASCORBIC ACID) 500 MG tablet Take 500 mg by mouth daily.   Yes Historical Provider, MD   Physical Exam: Filed Vitals:   06/25/13 0725 06/25/13 0810 06/25/13 0815  BP:  147/62 146/64  Pulse:  112 107  Resp:  30 19  SpO2: 96% 99% 99%     General:   Lethargic, responds to painful stimuli  Eyes: PERRL B  ENT: membranes moist, dentition fair  Neck: trachea midline, neck supple  Cardiovascular: regular, s1, s2  Respiratory: normal resp effort, no wheezing  Abdomen: soft, nondistended  Skin: no abnormal skin lesions seen, normal skin turgor  Musculoskeletal: perfused, no clubbing or cyanosis  Psychiatric: cannot determine given level of alertness  Neurologic: cannot fully determine given level of alertness  Labs on Admission:  Basic Metabolic Panel:  Recent Labs Lab 06/22/13 1442 06/25/13 0741  NA 134* 138  K 4.7 3.8  CL 96 97  CO2 22 19  GLUCOSE 311* 284*  BUN 38* 31*  CREATININE 1.20* 1.30*  CALCIUM 8.3* 8.8   Liver Function Tests:  Recent Labs Lab 06/22/13 1442 06/25/13 0741  AST 22 28  ALT 16 19  ALKPHOS 92 92  BILITOT 0.5 0.7  PROT 7.2 7.4  ALBUMIN 3.7 3.6   No results found for this basename: LIPASE, AMYLASE,  in the last 168 hours No results found for this basename: AMMONIA,  in the last 168 hours CBC:  Recent Labs Lab 06/22/13 1442 06/25/13 0741  WBC 12.5* 12.2*  NEUTROABS 6.9 7.8*  HGB 12.5 12.4  HCT 36.3 36.9  MCV 95.5 96.6  PLT 236 243   Cardiac Enzymes:  Recent Labs Lab 06/22/13 1557  TROPONINI <0.30    BNP (last 3 results) No results found for this basename: PROBNP,  in the last 8760 hours CBG:  Recent Labs Lab 06/25/13 0746  GLUCAP 267*    Radiological Exams on Admission: Ct Head Wo Contrast  06/25/2013   CLINICAL DATA:  Seizures.  History of CVA.  EXAM: CT HEAD WITHOUT CONTRAST  TECHNIQUE: Contiguous axial images were obtained from the base of the skull through the vertex without intravenous contrast.  COMPARISON:  06/22/2013  FINDINGS: The patient's known remote hemorrhagic infarction in the right posterior medial temporal lobe is only apparent as subtle hypodensity below the occipital horn of the right lateral ventricle on CT, and difficult to separate out from  the underlying mild chronic ischemic microvascular white matter disease.  Otherwise, the brainstem, cerebellum, cerebral peduncles, thalamus, basal ganglia, basilar cisterns, and ventricular system appear within normal limits. No intracranial hemorrhage, mass lesion, or acute CVA. Mild chronic right sphenoid sinusitis.  IMPRESSION: 1. No acute intracranial findings. 2. 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 3. Periventricular white matter and corona radiata hypodensities favor chronic ischemic microvascular white matter disease.   Electronically Signed   By: Herbie Baltimore   On: 06/25/2013 08:59    Assessment/Plan Principal Problem:   Seizure Active Problems:   DM type 2 (diabetes mellitus, type 2)   HTN (hypertension)   CKD (chronic kidney disease) stage 3, GFR 30-59 ml/min   Hemorrhage in the brain-13 x 22 x 14 mm right posterior temporo-occipital intra-axial acute   Seizures: - Neurology  has been consulted through the ED - Would cont AED's for now and PRN ativan pending recs from Neurology - Will order EEG - MRI was done 3 days ago. Defer repeating this study to Neurology. - As pt is currently post-ictal, would admit to stepdown for now  DM: - Cont on SSI - While in the hospital, would hold metformin  CKD: - Appears to be relatively stable - Will monitor closely  HTN: - BP stable - Cont monitor for now - Cont meds  ?T wave changes - Will cycle enzymes - No noticeable changes compared to EKG from 06/23/13 - Cont on monitor  DVT prophylaxis: - SCD's  Code Status: Full (must indicate code status--if unknown or must be presumed, indicate so) Family Communication: Pt in room (indicate person spoken with, if applicable, with phone number if by telephone) Disposition Plan: Pending (indicate anticipated LOS)  Time spent:  Anishka Bushard K [facility] Pager [phone]  If 7PM-7AM, please contact  night-coverage [email or web address] Password [facility] 06/25/2013, 9:48 AM

## 2013-06-25 NOTE — Progress Notes (Signed)
Dr. Rhona Leavens notified that pt is agitated due to an inability to void -- bladder scan shows urine. Also pt's HR is sustaining in 120s and no admission orders are visible. Plan of care--insert foley catheter and Dr. Rhona Leavens will place cardiac med orders. Renette Butters, Viona Gilmore

## 2013-06-25 NOTE — Progress Notes (Signed)
Dr. Rhona Leavens notified that pt's temp and HR haven't decreased. Plan of care -- give additional Tylenol and a 250 ml NS bolus. Renette Butters, Viona Gilmore

## 2013-06-25 NOTE — ED Notes (Signed)
Pt had witnessed seizure lasting approximately 30 seconds with jerking motions.  Pt with scant amount of blood in mouth after seizure.  Pt obtunded post seizure.  O2 applied and VS assessed.

## 2013-06-25 NOTE — ED Provider Notes (Signed)
CSN: [id number]     Arrival date & time 06/25/13  4098 History     First MD Initiated Contact with Patient 06/25/13 [phone]     No chief complaint on file.  (Consider location/radiation/quality/duration/timing/severity/associated sxs/prior Treatment) HPI  72 year old female with prior history of small right parietal occipital hemorrhage in Oct of last year and subsequent seizures, currently on Keppra. Patient lives at home with family. Patient was brought into the ER for evaluations of seizure. History obtained through family members who is at bedside. Per husband, patient has had several bouts of seizure initially when she was diagnosed with having hemorrhagic stroke. She was placed on Keppra and has been doing well since. Patient has been taking his Keppra as described according to family member. For the past several days patient has been sleeping more than usual, complaining of headache and also having visual loss. She was evaluated both by her prior care Dr. and also by an ophthalmologist. She has had head CT scan, MRI, and eye evaluation that shows no acute changes. This morning, patient had 2 episodes of seizures. Each lasting less than a minute. Describe as body shaking, foaming in mouth, and staring into space.  Unable to arouse patient, patient was in a postictal state.  Patient had another seizure episode in the ED, and lasting less than a minute.   History limited due to patient's altered state. PCP is Dr. Aida Puffer. Neurologist is Dr. Pearlean Brownie    Past Medical History  Diagnosis Date  . Diabetes mellitus   . GERD (gastroesophageal reflux disease)   . Seizures   . Cataract   . Anxiety   . Stroke    Past Surgical History  Procedure Laterality Date  . 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  Cancer Mother   . Cancer Father   . Diabetes Paternal Grandmother    History  Substance Use Topics  . Smoking status: Never Smoker   . Smokeless tobacco: Never Used  . Alcohol Use: No     Comment: patient drinks cafnated drinks.    OB History   Grav Para Term Preterm Abortions TAB SAB Ect Mult Living                 Review of Systems  Unable to perform ROS: Mental status change    Allergies  Barbiturates  Home Medications   Current Outpatient Rx  Name  Route  Sig  Dispense  Refill  . acetaminophen (TYLENOL) 325 MG tablet   Oral   Take 650 mg by mouth every 6 (six) hours as needed for pain.         Marland Kitchen atorvastatin (LIPITOR) 10 MG tablet   Oral   Take 10 mg by mouth daily.          . B-D ULTRAFINE III SHORT PEN 31G X 8 MM MISC               . insulin detemir (LEVEMIR) 100 UNIT/ML injection   Subcutaneous   Inject 8 Units into the skin daily with breakfast.          . levETIRAcetam (KEPPRA) 250 MG tablet   Oral   Take 250 mg by mouth every 12 (twelve) hours.         . metFORMIN (  GLUCOPHAGE) 500 MG tablet   Oral   Take 1 tablet by mouth Twice daily with meals.         . metoprolol tartrate (LOPRESSOR) 25 MG tablet   Oral   Take 25 mg by mouth 2 (two) times daily.         . Multiple Vitamin (MULTIVITAMIN WITH MINERALS) TABS   Oral   Take 1 tablet by mouth daily.         Marland Kitchen omeprazole (PRILOSEC) 40 MG capsule   Oral   Take 40 mg by mouth 2 (two) times daily.          . sitaGLIPtin (JANUVIA) 25 MG tablet   Oral   Take 50 mg by mouth daily.         . vitamin C (ASCORBIC ACID) 500 MG tablet   Oral   Take 500 mg by mouth daily.          There were no vitals taken for this visit. Physical Exam  Nursing note and vitals reviewed. Constitutional: She appears lethargic.   Ill-appearing female, skin are pale  HENT:  Head: Normocephalic and atraumatic.  Lips are bruised.  No obvious tongue lac, limited visualization on exam  Eyes:   Pupils equal and reactive to light  Neck: Neck supple.  Cardiovascular:  Mild tachycardia without murmurs, rubs, or gallops noted  Pulmonary/Chest: Effort normal and breath sounds normal. She has no wheezes. She has no rales.  Abdominal: Soft.  Musculoskeletal: She exhibits no edema.  Lymphadenopathy:    She has no cervical adenopathy.  Neurological: She appears lethargic.  No facial droop, responds only to painful stimuli. However, protecting airway.    ED Course   Procedures (including critical care time)   Date: 06/25/2013  Rate: 113  Rhythm: sinus tachycardia  QRS Axis: normal  Intervals: PR prolonged and QT prolonged  ST/T Wave abnormalities: nonspecific ST/T changes ST elevation in AVR, ST depression and negative Twaves changes in ant/lat/inf  Conduction Disutrbances:first-degree A-V block  and right bundle branch block  Narrative Interpretation:   Old EKG Reviewed: changes noted    8:02 AM Patient with recurrent seizure, currently in postictal state. She is protecting her airway. Workup initiated. We'll obtain head CT stat. Will load pt with Keppra. Had head CT, brain MRI done 3 days ago that shows no acute finding. Care discussed with attending.  8:06 AM Pt has another seizure episode when Keppra was given.  ECG shows acute t-wave changes.  Troponin neg.  Will awaits head CT.    9:27 AM Head CT shows no acute abnormalities.   ABG with evidence of mild acidosis, patient  maintaining adequate airway.  I have consulted neurologist, Dr. Lu Duffel who recommend admission.  Will consult hospitalist for admission.  Pt currently stable, arousable only to painful stimuli. I worry that pt may has suffered a stroke or having MI.    9:38 AM i have consulted [facility], Dr. Rhona Leavens, who agrees to admit pt to step down, team 10, under his care.    CRITICAL CARE Performed by: Fayrene Helper Total critical care time: 40 min Critical care time was exclusive of separately billable  procedures and treating other patients. Critical care was necessary to treat or prevent imminent or life-threatening deterioration. Critical care was time spent personally by me on the following activities: development of treatment plan with patient and/or surrogate as well as nursing, discussions with consultants, evaluation of patient's response to treatment, examination of patient, obtaining history from  patient or surrogate, ordering and performing treatments and interventions, ordering and review of laboratory studies, ordering and review of radiographic studies, pulse oximetry and re-evaluation of patient's condition.   Labs Reviewed  CBC WITH DIFFERENTIAL - Abnormal; Notable for the following:    WBC 12.2 (*)    RBC 3.82 (*)    Neutro Abs 7.8 (*)    All other components within normal limits  COMPREHENSIVE METABOLIC PANEL - Abnormal; Notable for the following:    Glucose, Bld 284 (*)    BUN 31 (*)    Creatinine, Ser 1.30 (*)    GFR calc non Af Amer 40 (*)    GFR calc Af Amer 46 (*)    All other components within normal limits  GLUCOSE, CAPILLARY - Abnormal; Notable for the following:    Glucose-Capillary 267 (*)    All other components within normal limits  POCT I-STAT 3, BLOOD GAS (G3+) - Abnormal; Notable for the following:    pH, Arterial 7.327 (*)    pO2, Arterial 132.0 (*)    Acid-base deficit 5.0 (*)    All other components within normal limits  URINALYSIS, ROUTINE W REFLEX MICROSCOPIC  LEVETIRACETAM LEVEL  POCT I-STAT TROPONIN I   Ct Head Wo Contrast  06/25/2013   CLINICAL DATA:  Seizures.  History of CVA.  EXAM: CT HEAD WITHOUT CONTRAST  TECHNIQUE: Contiguous axial images were obtained from the base of the skull through the vertex without intravenous contrast.  COMPARISON:  06/22/2013  FINDINGS: The patient's known remote hemorrhagic infarction in the right posterior medial temporal lobe is only apparent as subtle hypodensity below the occipital horn of the right lateral  ventricle on CT, and difficult to separate out from the underlying mild chronic ischemic microvascular white matter disease.  Otherwise, the brainstem, cerebellum, cerebral peduncles, thalamus, basal ganglia, basilar cisterns, and ventricular system appear within normal limits. No intracranial hemorrhage, mass lesion, or acute CVA. Mild chronic right sphenoid sinusitis.  IMPRESSION: 1. No acute intracranial findings. 2. 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 3. Periventricular white matter and corona radiata hypodensities favor chronic ischemic microvascular white matter disease.   Electronically Signed   By: Herbie Baltimore   On: 06/25/2013 08:59   1. Seizure     MDM  BP 146/64  Pulse 107  Resp 19  SpO2 99%  I have reviewed nursing notes and vital signs. I personally reviewed the imaging tests through PACS system  I reviewed available ER/hospitalization records thought the EMR      Fayrene Helper, [place] 06/25/13 0940

## 2013-06-25 NOTE — Progress Notes (Addendum)
Pt noted to have persistent fevers on the floor. Also became tachycardic. Blood cx pending. CXR pending to r/o aspiration pna. Given pt's recent headaches, case was dsicussed with Neurology who recommends LP at bedside. For now, will empirically cover with vanc, rocephin, and Levaquin, and ampicillin to cover CNS as wells as questionable asp pneumonia. Will also volume resuscitate as tolerated.

## 2013-06-25 NOTE — Consult Note (Signed)
Reason for Consult: Recurrent generalized seizures.  HPI:                                                                                                                                          [name] [name] is an 72 y.o. 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 She was given a loading dose of Keppra 1000 mg IV. Her daily Keppra dose has been 250 mg twice a day and patient has been compliant. She was evaluated 3 days ago for severe headache and complaint of visual changes. She has a history of left visual field defect following stroke in 2013. CT scan of her head as well as MRI showed no signs of acute recurrent stroke. CT scan of her head today showed no signs of acute intracranial abnormality.  Past Medical History  Diagnosis Date  . Diabetes mellitus   . GERD (gastroesophageal reflux disease)   . Seizures   . Cataract   . Anxiety   . Stroke     Past Surgical History  Procedure Laterality Date  . 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 Cancer Mother   . Cancer Father   . Diabetes Paternal Grandmother     Social History:  reports that she has never smoked. She has never used smokeless tobacco. She reports that she does not drink alcohol or use illicit drugs.  Allergies  Allergen Reactions  . Barbiturates     Becomes restless.  Also with all "strong" medications like sleeping pills  . Latex Itching    MEDICATIONS:                                                                                                                     I have reviewed  the patient's current medications.   ROS:  History obtained from spouse  General ROS: negative for - chills, fatigue, fever, night sweats, weight gain or weight loss  Psychological ROS: negative for - behavioral disorder, hallucinations, memory difficulties, mood swings or suicidal ideation Ophthalmic ROS: Visual changes as noted on 06/22/2013 ENT ROS: negative for - epistaxis, nasal discharge, oral lesions, sore throat, tinnitus or vertigo Allergy and Immunology ROS: negative for - hives or itchy/watery eyes Hematological and Lymphatic ROS: negative for - bleeding problems, bruising or swollen lymph nodes Endocrine ROS: negative for - galactorrhea, hair pattern changes, polydipsia/polyuria or temperature intolerance Respiratory ROS: negative for - cough, hemoptysis, shortness of breath or wheezing Cardiovascular ROS: negative for - chest pain, dyspnea on exertion, edema or irregular heartbeat Gastrointestinal ROS: negative for - abdominal pain, diarrhea, hematemesis, nausea/vomiting or stool incontinence Genito-Urinary ROS: negative for - dysuria, hematuria, incontinence or urinary frequency/urgency Musculoskeletal ROS: negative for - joint swelling or muscular weakness Neurological ROS: as noted in HPI Dermatological ROS: negative for rash and skin lesion changes   Blood pressure 146/64, pulse 107, temperature 99.7 F (37.6 C), temperature source Rectal, resp. rate 19, SpO2 99.00%.   Neurologic Examination:                                                                                                      Stuporous and difficult to arouse. Breathing pattern was normal and regular. Pupils were equal and reacted normally to light. Extraocular movements were intact with oculocephalic maneuvers. Eyes were deviated conjugately to the left at rest. No facial  asymmetry was noted. Patient had no spontaneous movements and no abnormal posturing. Withdrawal movements of extremities to noxious stimulation were equal. Deep tendon reflexes were 2+ and symmetrical. Plantar responses were mute bilaterally. Carotid auscultation was normal.  Lab Results  Component Value Date/Time   CHOL 155 12/12/2012  1:41 AM    Results for orders placed during the hospital encounter of 06/25/13 (from the past 48 hour(s))  CBC WITH DIFFERENTIAL     Status: Abnormal   Collection Time    06/25/13  7:41 AM      Result Value Range   WBC 12.2 (*) 4.0 - 10.5 K/uL   RBC 3.82 (*) 3.87 - 5.11 MIL/uL   Hemoglobin 12.4  12.0 - 15.0 g/dL   HCT 16.1  09.6 - 04.5 %   MCV 96.6  78.0 - 100.0 fL   MCH 32.5  26.0 - 34.0 pg   MCHC 33.6  30.0 - 36.0 g/dL   RDW 40.9  81.1 - 91.4 %   Platelets 243  150 - 400 K/uL   Neutrophils Relative % 64  43 - 77 %   Neutro Abs 7.8 (*) 1.7 - 7.7 K/uL   Lymphocytes Relative 27  12 - 46 %   Lymphs Abs 3.3  0.7 - 4.0 K/uL   Monocytes Relative 8  3 - 12 %   Monocytes Absolute 1.0  0.1 - 1.0 K/uL   Eosinophils Relative 0  0 - 5 %   Eosinophils Absolute 0.0  0.0 - 0.7 K/uL   Basophils Relative  0  0 - 1 %   Basophils Absolute 0.0  0.0 - 0.1 K/uL  COMPREHENSIVE METABOLIC PANEL     Status: Abnormal   Collection Time    06/25/13  7:41 AM      Result Value Range   Sodium 138  135 - 145 mEq/L   Potassium 3.8  3.5 - 5.1 mEq/L   Chloride 97  96 - 112 mEq/L   CO2 19  19 - 32 mEq/L   Glucose, Bld 284 (*) 70 - 99 mg/dL   BUN 31 (*) 6 - 23 mg/dL   Creatinine, Ser 4.09 (*) 0.50 - 1.10 mg/dL   Calcium 8.8  8.4 - 81.1 mg/dL   Total Protein 7.4  6.0 - 8.3 g/dL   Albumin 3.6  3.5 - 5.2 g/dL   AST 28  0 - 37 U/L   ALT 19  0 - 35 U/L   Alkaline Phosphatase 92  39 - 117 U/L   Total Bilirubin 0.7  0.3 - 1.2 mg/dL   GFR calc non Af Amer 40 (*) >90 mL/min   GFR calc Af Amer 46 (*) >90 mL/min   Comment: (NOTE)     The eGFR has been calculated using the CKD  EPI equation.     This calculation has not been validated in all clinical situations.     eGFR's persistently <90 mL/min signify possible Chronic Kidney     Disease.  GLUCOSE, CAPILLARY     Status: Abnormal   Collection Time    06/25/13  7:46 AM      Result Value Range   Glucose-Capillary 267 (*) 70 - 99 mg/dL  POCT I-STAT TROPONIN I     Status: None   Collection Time    06/25/13  7:55 AM      Result Value Range   Troponin i, poc 0.02  0.00 - 0.08 ng/mL   Comment 3            Comment: Due to the release kinetics of cTnI,     a negative result within the first hours     of the onset of symptoms does not rule out     myocardial infarction with certainty.     If myocardial infarction is still suspected,     repeat the test at appropriate intervals.  POCT I-STAT 3, BLOOD GAS (G3+)     Status: Abnormal   Collection Time    06/25/13  8:24 AM      Result Value Range   pH, Arterial 7.327 (*) 7.350 - 7.450   pCO2 arterial 38.3  35.0 - 45.0 mmHg   pO2, Arterial 132.0 (*) 80.0 - 100.0 mmHg   Bicarbonate 20.1  20.0 - 24.0 mEq/L   TCO2 21  0 - 100 mmol/L   O2 Saturation 99.0     Acid-base deficit 5.0 (*) 0.0 - 2.0 mmol/L   Patient temperature 98.6 F     Collection site RADIAL, ALLEN'S TEST ACCEPTABLE     Drawn by RT     Sample type ARTERIAL      Ct Head Wo Contrast  06/25/2013   CLINICAL DATA:  Seizures.  History of CVA.  EXAM: CT HEAD WITHOUT CONTRAST  TECHNIQUE: Contiguous axial images were obtained from the base of the skull through the vertex without intravenous contrast.  COMPARISON:  06/22/2013  FINDINGS: The patient's known remote hemorrhagic infarction in the right posterior medial temporal lobe is only apparent as subtle hypodensity below the occipital  horn of the right lateral ventricle on CT, and difficult to separate out from the underlying mild chronic ischemic microvascular white matter disease.  Otherwise, the brainstem, cerebellum, cerebral peduncles, thalamus, basal  ganglia, basilar cisterns, and ventricular system appear within normal limits. No intracranial hemorrhage, mass lesion, or acute CVA. Mild chronic right sphenoid sinusitis.  IMPRESSION: 1. No acute intracranial findings. 2. 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 3. Periventricular white matter and corona radiata hypodensities favor chronic ischemic microvascular white matter disease.   Electronically Signed   By: Herbie Baltimore   On: 06/25/2013 08:59   Assessment/Plan: 72 year old lady with previous stroke and seizure disorder presenting with recurrent generalized seizures. Examination was unremarkable with no clinical indication of acute recurrent stroke. No changes were noted on CT scan of her head as well. Small recurrent acute stroke with cortical involvement cannot be ruled out at this point, however.  Recommendations: 1. Increase Keppra to 500 mg twice a day 2. MRI of the brain without contrast to rule out recurrent acute stroke. 3. No further stroke workup if MRI study is unremarkable.  We will continue to follow this patient with you.  C.R. Roseanne Reno, MD [facility] [phone]  06/25/2013, 10:10 AM

## 2013-06-25 NOTE — Procedures (Addendum)
Procedure: Lumbar puncture  Indication: Rule out meningitis; fever and recent severe headache, as well as recurrent seizures.  Description: Lumbar puncture was performed via the L3-4 interspace under sterile conditions following instillation of 1% lidocaine for local anesthesia. Tap was atraumatic. Fluid was clear in colorless. Opening pressure was 170 mm of CSF. A total of 10 cc were collected for routine laboratory studies. Patient tolerated procedure well. There were no complications.  Venetia Maxon M.D. [facility] [phone]

## 2013-06-25 NOTE — Progress Notes (Signed)
Notified Dr. Rhona Leavens via text page that pt has arrived. Renette Butters, Viona Gilmore

## 2013-06-25 NOTE — ED Notes (Signed)
Pt with hx of hemorraghic stroke.  Since that time pt has been having seizures.  Pt seen at this ED for the same.  Per EMS, family reports seizures have been occuring for 2 weeks.  Pt woke this morning and then had a witnessed seizure.  EMS was called out.  Upon EMS arrival pt was alert and oriented to her baseline.  Family decided to refuse transport.  While EMS was attempting to convince family to transport, pt had another seizure.  Pt transported here.  No meds given.  Pt opens eyes to stimuli only.

## 2013-06-26 LAB — GLUCOSE, CAPILLARY
Glucose-Capillary: 122 mg/dL — ABNORMAL HIGH (ref 70–99)
Glucose-Capillary: 93 mg/dL (ref 70–99)
Glucose-Capillary: 147 mg/dL — ABNORMAL HIGH (ref 70–99)

## 2013-06-26 LAB — COMPREHENSIVE METABOLIC PANEL
ALT: 16 U/L (ref 0–35)
BUN: 23 mg/dL (ref 6–23)
CO2: 22 mEq/L (ref 19–32)
Calcium: 8.2 mg/dL — ABNORMAL LOW (ref 8.4–10.5)
Creatinine, Ser: 1.03 mg/dL (ref 0.50–1.10)
GFR calc Af Amer: 61 mL/min — ABNORMAL LOW (ref >90)
GFR calc non Af Amer: 53 mL/min — ABNORMAL LOW (ref >90)
Glucose, Bld: 137 mg/dL — ABNORMAL HIGH (ref 70–99)
Total Protein: 6.7 g/dL (ref 6.0–8.3)

## 2013-06-26 LAB — CBC
Platelets: 203 10*3/uL (ref 150–400)
RBC: 3.49 MIL/uL — ABNORMAL LOW (ref 3.87–5.11)
RDW: 12.7 % (ref 11.5–15.5)
WBC: 11.2 10*3/uL — ABNORMAL HIGH (ref 4.0–10.5)

## 2013-06-26 MED ORDER — METOPROLOL TARTRATE 1 MG/ML IV SOLN
5.0000 mg | Freq: Four times a day (QID) | INTRAVENOUS | Status: DC
  Administered 2013-06-26 – 2013-06-27 (×3): 5 mg via INTRAVENOUS
  Filled 2013-06-26 (×7): qty 5

## 2013-06-26 MED ORDER — LEVOFLOXACIN IN D5W 750 MG/150ML IV SOLN
750.0000 mg | INTRAVENOUS | Status: DC
  Administered 2013-06-27 – 2013-07-03 (×4): 750 mg via INTRAVENOUS
  Filled 2013-06-26 (×4): qty 150

## 2013-06-26 MED ORDER — VANCOMYCIN HCL IN DEXTROSE 750-5 MG/150ML-% IV SOLN
750.0000 mg | INTRAVENOUS | Status: DC
  Filled 2013-06-26: qty 150

## 2013-06-26 MED ORDER — FLUCONAZOLE 100MG IVPB
100.0000 mg | INTRAVENOUS | Status: DC
  Administered 2013-06-26 – 2013-06-28 (×3): 100 mg via INTRAVENOUS
  Filled 2013-06-26 (×4): qty 50

## 2013-06-26 MED ORDER — SODIUM CHLORIDE 0.9 % IV SOLN
1.0000 g | Freq: Three times a day (TID) | INTRAVENOUS | Status: DC
  Administered 2013-06-26: 1 g via INTRAVENOUS
  Filled 2013-06-26 (×3): qty 1000

## 2013-06-26 NOTE — Progress Notes (Signed)
[facility] Progress Note [place] TEAM 1 - Stepdown/ICU TEAM   [name] [name] WUJ:[id number] DOB: [birth year] DOA: 06/25/2013 PCP: Aida Puffer, MD  Brief narrative: 72 y.o. female with a hx of previous intracranial hemorrhage, seizures, htn, and diabetes who presented to the ED with recurrent seizures. The patient presented to the ED on 8/20 with complaints of a headache lasting several days. This was associated with a left visual field cut. Neurology was consulted, and per recs, the patient underwent an MRI of the brain which was unremarkable. The patient was discharged home from the ED and treated symptomatically for her headache. On the morning of admission, the patient was noted to have a witnessed seizure. EMS was called. Upon EMS arrival, the patient was alert/oriented and the family refused transport. The pt then had another seizure and was transported to the ED. In the ED, the pt had another witnessed seizure lasting about 45sec. A CT head was done, which redemonstrated the known remote hmorrhagic infarct, without acute changes. Neurology was consulted and the Hospitalists were consulted for admission.   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 - appears to be controlled at this time   FUO - suspected aspiration pneumonia/pneumonitis  LP results not suggestive of meningitis - no fever thus far today - ?if this is simply a CNS mediated fever- cont empiric coverage for aspiration pneumonitis - does not need meningitis coverage at this time   DM type 2  CBG well controlled at this time   HTN  BP reasonably controlled at this time   CKD stage 3, GFR 30-59 ml/min Creatinine appaers to be stable   Hx of Hemorrhage in the brain - 13 x 22 x 14 mm right posterior temporo-occipital intra-axial   Code Status: FULL Family Communication: spoke w/ husband at the bedside Disposition Plan: SDU  Consultants: Neurology   Procedures: 8/24 - LP per Neuro    Antibiotics: Ampicillin 8/23 >> 8/24 Rocephin 8/23 >> 8/24 Levaquin 8/23 >> Vancomycin 8/23 >> 8/24  DVT prophylaxis: SCDs  HPI/Subjective: Pt will open eyes, and is able to answer so very simple questions.  Remain lethargic.  No evidence of sz activity.  Unable to provide a full hx.  Objective: Blood pressure 151/71, pulse 108, temperature 98.3 F (36.8 C), temperature source Oral, resp. rate 14, height 5\' 5"  (1.651 m), weight 50.1 kg (110 lb 7.2 oz), SpO2 100.00%.  Intake/Output Summary (Last 24 hours) at 06/26/13 1502 Last data filed at 06/26/13 1341  Gross per 24 hour  Intake 2844.17 ml  Output   1750 ml  Net 1094.17 ml    Exam: General: No acute respiratory distress Lungs: Clear to auscultation bilaterally without wheezes or crackles Cardiovascular: Regular rate and rhythm without murmur gallop or rub normal S1 and S2 Abdomen: Nontender, nondistended, soft, bowel sounds positive, no rebound, no ascites, no appreciable mass Extremities: No significant cyanosis, clubbing, or edema bilateral lower extremities  Data Reviewed: Basic Metabolic Panel:  Recent Labs Lab 06/22/13 1442 06/25/13 0741 06/26/13 0750  NA 134* 138 141  K 4.7 3.8 3.7  CL 96 97 105  CO2 22 19 22   GLUCOSE 311* 284* 137*  BUN 38* 31* 23  CREATININE 1.20* 1.30* 1.03  CALCIUM 8.3* 8.8 8.2*   Liver Function Tests:  Recent Labs Lab 06/22/13 1442 06/25/13 0741 06/26/13 0750  AST 22 28 28   ALT 16 19 16   ALKPHOS 92 92 68  BILITOT 0.5 0.7 0.4  PROT 7.2 7.4 6.7  ALBUMIN  3.7 3.6 3.1*   CBC:  Recent Labs Lab 06/22/13 1442 06/25/13 0741 06/26/13 0545  WBC 12.5* 12.2* 11.2*  NEUTROABS 6.9 7.8*  --   HGB 12.5 12.4 11.2*  HCT 36.3 36.9 33.7*  MCV 95.5 96.6 96.6  PLT 236 243 203   CBG:  Recent Labs Lab 06/25/13 1649 06/25/13 1955 06/26/13 0434 06/26/13 0804 06/26/13 1154  GLUCAP 188* 118* 148* 122* 93    Recent Results (from the past 240 hour(s))  MRSA PCR SCREENING      Status: None   Collection Time    06/25/13  1:25 PM      Result Value Range Status   MRSA by PCR NEGATIVE  NEGATIVE Final   Comment:            The GeneXpert MRSA Assay (FDA     approved for NASAL specimens     only), is one component of a     comprehensive MRSA colonization     surveillance program. It is not     intended to diagnose MRSA     infection nor to guide or     monitor treatment for     MRSA infections.  CULTURE, BLOOD (ROUTINE X 2)     Status: None   Collection Time    06/25/13  4:15 PM      Result Value Range Status   Specimen Description BLOOD RIGHT ARM   Final   Special Requests     Final   Value: BOTTLES DRAWN AEROBIC AND ANAEROBIC 10CC BLUE,5CC RED   Culture  Setup Time     Final   Value: 06/25/2013 22:13     Performed at [facility]   Culture     Final   Value:        BLOOD CULTURE RECEIVED NO GROWTH TO DATE CULTURE WILL BE HELD FOR 5 DAYS BEFORE ISSUING A FINAL NEGATIVE REPORT     Performed at [facility]   Report Status PENDING   Incomplete  CULTURE, BLOOD (ROUTINE X 2)     Status: None   Collection Time    06/25/13  4:30 PM      Result Value Range Status   Specimen Description BLOOD RIGHT HAND   Final   Special Requests BOTTLES DRAWN AEROBIC ONLY 10CC   Final   Culture  Setup Time     Final   Value: 06/25/2013 22:13     Performed at [facility]   Culture     Final   Value:        BLOOD CULTURE RECEIVED NO GROWTH TO DATE CULTURE WILL BE HELD FOR 5 DAYS BEFORE ISSUING A FINAL NEGATIVE REPORT     Performed at [facility]   Report Status PENDING   Incomplete  GRAM STAIN     Status: None   Collection Time    06/25/13  7:58 PM      Result Value Range Status   Specimen Description CSF   Final   Special Requests TUBE 2   Final   Gram Stain     Final   Value: CYTOSPIN SLIDE     WBC PRESENT, PREDOMINANTLY MONONUCLEAR     NO ORGANISMS SEEN   Report Status 06/25/2013 FINAL   Final     Studies:  Recent x-ray studies  have been reviewed in detail by the Attending Physician  Scheduled Meds:  Scheduled Meds: . atorvastatin  10 mg Oral Daily  . fluconazole (  DIFLUCAN) IV  100 mg Intravenous Q24H  . insulin aspart  0-15 Units Subcutaneous Q4H  . levETIRAcetam  500 mg Intravenous Q12H  . [START ON 06/27/2013] levofloxacin (LEVAQUIN) IV  750 mg Intravenous Q48H  . metoprolol tartrate  25 mg Oral BID  . multivitamin with minerals  1 tablet Oral Daily  . pantoprazole  40 mg Oral Daily  . sodium chloride  3 mL Intravenous Q12H  . vitamin C  500 mg Oral Daily    Time spent on care of this patient: 35 mins   [facility]  [facility] Office  [phone] Pager - Text Page per Loretha Stapler as per below:  On-Call/Text Page:      [email or web address]      password [email or web address]  If 7PM-7AM, please contact night-coverage [email or web address] Password [email or web address] 06/26/2013, 3:02 PM   LOS: 1 day

## 2013-06-26 NOTE — Progress Notes (Signed)
ANTIBIOTIC CONSULT NOTE - FOLLOW UP  Pharmacy Consult for vancomycin, Levaquin, Rocephin Indication: r/o meningitis vs PNA  Patient Measurements: Height: 5\' 5"  (165.1 cm) Weight: 110 lb 7.2 oz (50.1 kg) IBW/kg (Calculated) : 57 Vital Signs: Temp: 98.4 F (36.9 C) (08/24 0740) Temp src: Oral (08/24 0740) BP: 121/51 mmHg (08/24 0740) Pulse Rate: 97 (08/24 0740) Intake/Output from previous day: 08/23 0701 - 08/24 0700 In: 2114.2 [I.V.:1754.2; IV Piggyback:360] Out: 1175 [Urine:1175] Intake/Output from this shift: Total I/O In: 205 [IV Piggyback:205] Out: 300 [Urine:300] Labs:  Recent Labs  06/25/13 0741 06/26/13 0545 06/26/13 0750  WBC 12.2* 11.2*  --   HGB 12.4 11.2*  --   PLT 243 203  --   CREATININE 1.30*  --  1.03   Estimated Creatinine Clearance: 39 ml/min (by C-G formula based on Cr of 1.03).   Microbiology: Recent Results (from the past 720 hour(s))  MRSA PCR SCREENING     Status: None   Collection Time    06/25/13  1:25 PM      Result Value Range Status   MRSA by PCR NEGATIVE  NEGATIVE Final   Comment:            The GeneXpert MRSA Assay (FDA     approved for NASAL specimens     only), is one component of a     comprehensive MRSA colonization     surveillance program. It is not     intended to diagnose MRSA     infection nor to guide or     monitor treatment for     MRSA infections.  GRAM STAIN     Status: None   Collection Time    06/25/13  7:58 PM      Result Value Range Status   Specimen Description CSF   Final   Special Requests TUBE 2   Final   Gram Stain     Final   Value: CYTOSPIN SLIDE     WBC PRESENT, PREDOMINANTLY MONONUCLEAR     NO ORGANISMS SEEN   Report Status 06/25/2013 FINAL   Final    Anti-infectives   Start     Dose/Rate Route Frequency Ordered Stop   06/27/13 1000  levofloxacin (LEVAQUIN) IVPB 500 mg  Status:  Discontinued     500 mg 100 mL/hr over 60 Minutes Intravenous Every 48 hours 06/25/13 1849 06/26/13 0948   06/27/13 1000  levofloxacin (LEVAQUIN) IVPB 750 mg     750 mg 100 mL/hr over 90 Minutes Intravenous Every 48 hours 06/26/13 0948     06/26/13 1800  vancomycin (VANCOCIN) 500 mg in sodium chloride 0.9 % 100 mL IVPB  Status:  Discontinued     500 mg 100 mL/hr over 60 Minutes Intravenous Every 24 hours 06/25/13 1819 06/26/13 0948   06/26/13 1800  vancomycin (VANCOCIN) IVPB 750 mg/150 ml premix     750 mg 150 mL/hr over 60 Minutes Intravenous Every 24 hours 06/26/13 0948     06/26/13 1400  ampicillin (OMNIPEN) 1 g in sodium chloride 0.9 % 50 mL IVPB     1 g 150 mL/hr over 20 Minutes Intravenous 3 times per day 06/26/13 0948     06/25/13 2000  levofloxacin (LEVAQUIN) IVPB 750 mg     750 mg 100 mL/hr over 90 Minutes Intravenous  Once 06/25/13 1849 06/25/13 2303   06/25/13 1930  ampicillin (OMNIPEN) 1 g in sodium chloride 0.9 % 50 mL IVPB  Status:  Discontinued  1 g 150 mL/hr over 20 Minutes Intravenous Every 12 hours 06/25/13 1841 06/26/13 0948   06/25/13 1830  ciprofloxacin (CIPRO) IVPB 400 mg  Status:  Discontinued     400 mg 200 mL/hr over 60 Minutes Intravenous Every 24 hours 06/25/13 1819 06/25/13 1849   06/25/13 1830  cefTRIAXone (ROCEPHIN) 2 g in dextrose 5 % 50 mL IVPB     2 g 100 mL/hr over 30 Minutes Intravenous Every 12 hours 06/25/13 1819     06/25/13 1830  vancomycin (VANCOCIN) IVPB 1000 mg/200 mL premix     1,000 mg 200 mL/hr over 60 Minutes Intravenous  Once 06/25/13 1819 06/25/13 2344     Assessment: 72 yof on IV antibiotics for r/o meningitis vs PNA. SCr has improved this am and CrCl ~88ml/min. Will adjust antibiotics. Tmax 103.1 (trending down now). WBC 12.2>>11.2.   vanc 8/23>> Ceftriaxone 8/23>> levoflox 8/23>> Ampicillin 8/23 >>  8/23 CSF Gm stain-clear/colorless, no orgs. 8/23 CSF cx >> 8/23 Blood >> 8/23 MRSA PCR neg  Goal of Therapy:  Vancomycin trough level 15-20 mcg/ml  Plan:  - Adjust levaquin to 750mg  IV q48 - Continue ceftriaxone 2g IV Q12h -  Adjust vancomycin to 750mg  IV Q24h - Will also adjust ampicillin to 1g IV q8h.   Link Snuffer, PharmD, BCPS Clinical Pharmacist [phone] 06/26/2013,9:49 AM

## 2013-06-26 NOTE — Progress Notes (Signed)
Subjective: Patient had no complaints including no headache. No recurrent seizures reported. No overnight adverse clinical events reported.  Objective: Current vital signs: BP 121/51  Pulse 97  Temp(Src) 98.4 F (36.9 C) (Oral)  Resp 16  Ht 5\' 5"  (1.651 m)  Wt 50.1 kg (110 lb 7.2 oz)  BMI 18.38 kg/m2  SpO2 100%  Neurologic Exam: Alert and in no acute distress. Patient was well oriented to as well as place and correct age. Pupils and extraocular movements were normal. No facial weakness noted. Strength of extremities was including left upper and lower extremities proximally and distally.  Lumbar puncture was performed on 06/25/2013 following development of fever, thought to most likely be secondary to aspiration pneumonia. However, given her recent history of severe headache and recurrent seizures, meningitis cannot be ruled out. CSF was unremarkable with no signs of acute meningitis.  Medications: I have reviewed the patient's current medications.  Assessment/Plan: Recurrent grand mal seizures, controlled on current AED regimen. Patient shows no clinical signs of recurrent stroke. MRI of the brain will be obtained, however, to rule out recurrent acute stroke as a contributing to recurrent seizures.  Recommend no changes in current management including continuing same and time epilepsy drugs, as well as antibiotic management of probable aspiration pneumonia.  We will continue to follow this patient closely with you.  C.R. Roseanne Reno, MD [facility] [phone]  06/26/2013  9:34 AM

## 2013-06-27 LAB — CBC
HCT: 35 % — ABNORMAL LOW (ref 36.0–46.0)
Hemoglobin: 11.5 g/dL — ABNORMAL LOW (ref 12.0–15.0)
Hemoglobin: 11.1 g/dL — ABNORMAL LOW (ref 12.0–15.0)
MCH: 32.1 pg (ref 26.0–34.0)
MCH: 32.6 pg (ref 26.0–34.0)
MCHC: 32.9 g/dL (ref 30.0–36.0)
RBC: 3.4 MIL/uL — ABNORMAL LOW (ref 3.87–5.11)
RDW: 12.7 % (ref 11.5–15.5)
WBC: 13.3 10*3/uL — ABNORMAL HIGH (ref 4.0–10.5)

## 2013-06-27 LAB — COMPREHENSIVE METABOLIC PANEL
ALT: 18 U/L (ref 0–35)
Albumin: 2.9 g/dL — ABNORMAL LOW (ref 3.5–5.2)
Alkaline Phosphatase: 64 U/L (ref 39–117)
Chloride: 106 mEq/L (ref 96–112)
GFR calc Af Amer: 61 mL/min — ABNORMAL LOW (ref >90)
Glucose, Bld: 158 mg/dL — ABNORMAL HIGH (ref 70–99)
Potassium: 3.8 mEq/L (ref 3.5–5.1)
Sodium: 143 mEq/L (ref 135–145)
Total Bilirubin: 0.3 mg/dL (ref 0.3–1.2)
Total Protein: 6.4 g/dL (ref 6.0–8.3)

## 2013-06-27 LAB — GLUCOSE, CAPILLARY
Glucose-Capillary: 120 mg/dL — ABNORMAL HIGH (ref 70–99)
Glucose-Capillary: 158 mg/dL — ABNORMAL HIGH (ref 70–99)
Glucose-Capillary: 299 mg/dL — ABNORMAL HIGH (ref 70–99)
Glucose-Capillary: 334 mg/dL — ABNORMAL HIGH (ref 70–99)

## 2013-06-27 LAB — TROPONIN I: Troponin I: <0.3 ng/mL (ref <0.30)

## 2013-06-27 MED ORDER — SODIUM CHLORIDE 0.45 % IV SOLN
INTRAVENOUS | Status: DC
  Administered 2013-06-27: 125 mL/h via INTRAVENOUS
  Administered 2013-06-28: via INTRAVENOUS
  Administered 2013-06-29: 1000 mL via INTRAVENOUS
  Administered 2013-06-29 – 2013-06-30 (×3): via INTRAVENOUS

## 2013-06-27 MED ORDER — METOPROLOL TARTRATE 25 MG PO TABS: 25.0000 mg | ORAL_TABLET | Freq: Two times a day (BID) | ORAL | Status: DC

## 2013-06-27 MED ORDER — INSULIN ASPART 100 UNIT/ML ~~LOC~~ SOLN
0.0000 [IU] | Freq: Three times a day (TID) | SUBCUTANEOUS | Status: DC
  Administered 2013-06-27: 8 [IU] via SUBCUTANEOUS
  Administered 2013-06-28: 3 [IU] via SUBCUTANEOUS
  Administered 2013-06-28: 11 [IU] via SUBCUTANEOUS
  Administered 2013-06-28: 5 [IU] via SUBCUTANEOUS
  Administered 2013-06-29: 8 [IU] via SUBCUTANEOUS
  Administered 2013-06-29: 5 [IU] via SUBCUTANEOUS

## 2013-06-27 MED ORDER — METOPROLOL TARTRATE 1 MG/ML IV SOLN
10.0000 mg | Freq: Four times a day (QID) | INTRAVENOUS | Status: DC
  Administered 2013-06-27 – 2013-06-29 (×8): 10 mg via INTRAVENOUS
  Filled 2013-06-27 (×12): qty 10

## 2013-06-27 NOTE — Progress Notes (Addendum)
[facility] Progress Note [place] TEAM 1 - Stepdown/ICU TEAM   Osiris Charles ZOX:[id number] DOB: 04/22/1941 DOA: 06/25/2013 PCP: Aida Puffer, MD  Brief narrative: 72 y.o. female with a hx of previous intracranial hemorrhage, seizures, htn, and diabetes who presented to the ED with recurrent seizures. The patient presented to the ED on 8/20 with complaints of a headache lasting several days. This was associated with a left visual field cut. Neurology was consulted, and per recs, the patient underwent an MRI of the brain which was unremarkable. The patient was discharged home from the ED and treated symptomatically for her headache. On the morning of admission, the patient was noted to have a witnessed seizure. EMS was called. Upon EMS arrival, the patient was alert/oriented and the family refused transport. The pt then had another seizure and was transported to the ED. In the ED, the pt had another witnessed seizure lasting about 45sec. A CT head was done, which redemonstrated the known remote hmorrhagic infarct, without acute changes. Neurology was consulted and the Hospitalists were consulted for admission.   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 - appears to be controlled at this time - attempt to transition to oral meds over next 24 hrs   FUO - suspected aspiration pneumonia/pneumonitis  LP results not suggestive of meningitis - UA unremarkable - no fever thus far while hospitalized - cont empiric coverage for aspiration pneumonitis - does not need meningitis coverage at this time - f/u CXR in AM  DM type 2  CBG reasonably well controlled at this time   HTN  BP trending upward - resume home oral meds - follow trend   CKD stage 3, GFR 30-59 ml/min Creatinine appaers to be stable - watch bicarb - hydrate gently with climbing BUN  Diffuse T wave changes  Noted on EKG in ED - no hx of cp - will check troponin - f/u EKG today   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 No suspicion of acute CVA this admit - w/ hx of vocal cord nodule and w/ altered mental state, will ask SLP to check pt - go ahead and allow clear liquids alone until evaluated by SLP   Thrush  IV diflucan until sure pt can tolerate pills  Code Status: FULL Family Communication: no family present at time of exam today  Disposition Plan: stable for transfer to neuro tele bed - resume home meds - begin PT/OT - f/u CXR in AM - anticipated d/c 48-72hrs but may require CIR or SNF  Consultants: Neurology   Procedures: 8/24 - LP per Neuro   Antibiotics: Ampicillin 8/23 >> 8/24 Rocephin 8/23 >> 8/24 Vancomycin 8/23 >> 8/24 Levaquin 8/23 >>  DVT prophylaxis: SCDs  HPI/Subjective: Much more alert today.  Denies f/c, sob, n/v, or abdom pain.  Denies HA.  Says she is hungry.  Is not oriented to place, time, or situation.    Objective: Blood pressure 141/64, pulse 106, temperature 98.1 F (36.7 C), temperature source Oral, resp. rate 19, height 5\' 5"  (1.651 m), weight 50.1 kg (110 lb 7.2 oz), SpO2 100.00%.  Intake/Output Summary (Last 24 hours) at 06/27/13 1100 Last data filed at 06/27/13 0800  Gross per 24 hour  Intake   2105 ml  Output   1200 ml  Net    905 ml    Exam: General: No acute respiratory distress Lungs: Clear to auscultation bilaterally without wheezes or crackles Cardiovascular: tachycardic but regular w/o gallup, rub, or M Abdomen: Nontender,  nondistended, soft, bowel sounds positive, no rebound, no ascites, no appreciable mass Extremities: No significant cyanosis, clubbing, or edema bilateral lower extremities  Data Reviewed: Basic Metabolic Panel:  Recent Labs Lab 06/22/13 1442 06/25/13 0741 06/26/13 0750 06/27/13 0530  NA 134* 138 141 143  K 4.7 3.8 3.7 3.8  CL 96 97 105 106  CO2 22 19 22  17*  GLUCOSE 311* 284* 137* 158*  BUN 38* 31* 23 34*  CREATININE 1.20* 1.30* 1.03 1.03  CALCIUM 8.3* 8.8 8.2* 7.9*   Liver  Function Tests:  Recent Labs Lab 06/22/13 1442 06/25/13 0741 06/26/13 0750 06/27/13 0530  AST 22 28 28 31   ALT 16 19 16 18   ALKPHOS 92 92 68 64  BILITOT 0.5 0.7 0.4 0.3  PROT 7.2 7.4 6.7 6.4  ALBUMIN 3.7 3.6 3.1* 2.9*   CBC:  Recent Labs Lab 06/22/13 1442 06/25/13 0741 06/26/13 0545 06/27/13 0530  WBC 12.5* 12.2* 11.2* 13.7*  NEUTROABS 6.9 7.8*  --   --   HGB 12.5 12.4 11.2* 11.5*  HCT 36.3 36.9 33.7* 35.0*  MCV 95.5 96.6 96.6 97.8  PLT 236 243 203 203   CBG:  Recent Labs Lab 06/26/13 1544 06/26/13 1943 06/27/13 06/27/13 0441 06/27/13 0759  GLUCAP 147* 97 120* 149* 120*    Recent Results (from the past 240 hour(s))  MRSA PCR SCREENING     Status: None   Collection Time    06/25/13  1:25 PM      Result Value Range Status   MRSA by PCR NEGATIVE  NEGATIVE Final   Comment:            The GeneXpert MRSA Assay (FDA     approved for NASAL specimens     only), is one component of a     comprehensive MRSA colonization     surveillance program. It is not     intended to diagnose MRSA     infection nor to guide or     monitor treatment for     MRSA infections.  CULTURE, BLOOD (ROUTINE X 2)     Status: None   Collection Time    06/25/13  4:15 PM      Result Value Range Status   Specimen Description BLOOD RIGHT ARM   Final   Special Requests     Final   Value: BOTTLES DRAWN AEROBIC AND ANAEROBIC 10CC BLUE,5CC RED   Culture  Setup Time     Final   Value: 06/25/2013 22:13     Performed at [facility]   Culture     Final   Value:        BLOOD CULTURE RECEIVED NO GROWTH TO DATE CULTURE WILL BE HELD FOR 5 DAYS BEFORE ISSUING A FINAL NEGATIVE REPORT     Performed at [facility]   Report Status PENDING   Incomplete  CULTURE, BLOOD (ROUTINE X 2)     Status: None   Collection Time    06/25/13  4:30 PM      Result Value Range Status   Specimen Description BLOOD RIGHT HAND   Final   Special Requests BOTTLES DRAWN AEROBIC ONLY 10CC   Final    Culture  Setup Time     Final   Value: 06/25/2013 22:13     Performed at [facility]   Culture     Final   Value:        BLOOD CULTURE RECEIVED NO GROWTH TO DATE CULTURE  WILL BE HELD FOR 5 DAYS BEFORE ISSUING A FINAL NEGATIVE REPORT     Performed at [facility]   Report Status PENDING   Incomplete  GRAM STAIN     Status: None   Collection Time    06/25/13  7:58 PM      Result Value Range Status   Specimen Description CSF   Final   Special Requests TUBE 2   Final   Gram Stain     Final   Value: CYTOSPIN SLIDE     WBC PRESENT, PREDOMINANTLY MONONUCLEAR     NO ORGANISMS SEEN   Report Status 06/25/2013 FINAL   Final  CSF CULTURE     Status: None   Collection Time    06/25/13  7:58 PM      Result Value Range Status   Specimen Description CSF   Final   Special Requests TUBE 2   Final   Gram Stain     Final   Value: WBC PRESENT, PREDOMINANTLY MONONUCLEAR     NO ORGANISMS SEEN     CYTOSPIN Performed at [facility]     Performed at [facility]   Culture     Final   Value: NO GROWTH 1 DAY     Performed at [facility]   Report Status PENDING   Incomplete     Studies:  Recent x-ray studies have been reviewed in detail by the Attending Physician  Scheduled Meds:  Scheduled Meds: . fluconazole (DIFLUCAN) IV  100 mg Intravenous Q24H  . insulin aspart  0-15 Units Subcutaneous Q4H  . levETIRAcetam  500 mg Intravenous Q12H  . levofloxacin (LEVAQUIN) IV  750 mg Intravenous Q48H  . metoprolol  5 mg Intravenous Q6H  . sodium chloride  3 mL Intravenous Q12H    Time spent on care of this patient: 35 mins   [facility] T  [facility] Office  [phone] Pager - Text Page per Loretha Stapler as per below:  On-Call/Text Page:      [email or web address]      password [email or web address]  If 7PM-7AM, please contact night-coverage [email or web address] Password [email or web address] 06/27/2013, 11:00 AM   LOS: 2 days

## 2013-06-27 NOTE — Evaluation (Addendum)
Clinical/Bedside Swallow Evaluation Patient Details  Name: [name] [name] MRN: [id number] Date of Birth: 1941-01-12  Today's Date: 06/27/2013 Time: 1478-2956 SLP Time Calculation (min): 25 min  Past Medical History:  Past Medical History  Diagnosis Date  . Diabetes mellitus   . GERD (gastroesophageal reflux disease)   . Seizures   . Cataract   . Anxiety   . Stroke    Past Surgical History:  Past Surgical History  Procedure Laterality Date  . 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  Pt also being tx with Diflucan for thrush. Pt's spouse reports poor intake at home but she denies severe dysphagia.  She does admit to occasional cough with food more than drink.  Pt also admits to reflux symptoms and has recently had her PPI increased to BID.     Assessment / Plan / Recommendation Clinical Impression  Pt presents with swallow function that appears clinically improved compared to [facility] 12/2012- She was able to self feed today (slight right hand tremor and pt is impulsive) but she did not demonstrate any s/s of aspiration.  She effectively masticated crackers without stasis and swallow was timely.    Note CXR negative for acute changes but chart indicates question of aspiration pneumonitis (pt does have reflux hx and ? Impact of seizure on airway protection). Spouse reports pt with poor intake at home prior to admission which pt states is due to lack of appetite.  She was requesting magic cup (orange flavored)- pt consumed magic cup during her previous hospital admission.    Rec pt diet be advanced to regular/thin with general aspiration and reflux precautions.   She will benefit from assistance given impulsivity and vision  deficits. Skilled intervention included educating family to findings, reviewing previous evaluations and providing heimlich manuever in writing for emergent needs.   SLP to sign off, thanks for this referral.     Aspiration Risk  Mild    Diet Recommendation Regular;Thin liquid   Medication Administration: Whole meds with puree Supervision: Patient able to self feed Compensations: Slow rate;Small sips/bites Postural Changes and/or Swallow Maneuvers: Seated upright 90 degrees;Upright 30-60 min after meal    Other  Recommendations Oral Care Recommendations: Oral care QID   Follow Up Recommendations  None    Frequency and Duration   n/a     Pertinent Vitals/Pain Afebrile, decreased    SLP Swallow Goals  n/a   Swallow Study Prior Functional Status   Feeds self at home, recommend OT for help with self feeding - ? Adaptive equipment needed for weakness    General Date of Onset: 06/27/13 HPI: 72 yo female adm to [facility] with seizures, PMH + for ICH, seizures, HTN, DM, vocal cord nodule s/p surgery, GERD - pt on a PPI.  Pt also being tx with Diflucan for thrush. Pt's spouse reports poor intake at home but she denies severe dysphagia.  She does admit to occasional cough with food more than drink.  Pt also admits to reflux symptoms and has recently had her PPI increased to BID.   Type of Study: Bedside swallow evaluation Previous Swallow Assessment: mbs feb 2014 - moderate oropharyngeal, + cervical esophageal dysphagia (severe spine curvature),  rec dys1/thin, mbs 09/01/12 rec dys2/thin Diet Prior to this Study: Thin liquids (clears) Temperature Spikes Noted: No Respiratory Status: Room air History of Recent Intubation: No Behavior/Cognition: Alert;Cooperative;Impulsive Oral Cavity - Dentition: Adequate natural dentition Self-Feeding Abilities: Able to feed self;Needs set up (needs some assist d/t vision difficulties, impulsivity) Patient Positioning: Upright in bed Baseline Vocal  Quality: Clear Volitional Cough: Strong Volitional Swallow: Able to elicit    Oral/Motor/Sensory Function Overall Oral Motor/Sensory Function: Appears within functional limits for tasks assessed   Ice Chips Ice chips: Not tested   Thin Liquid Thin Liquid: Within functional limits Presentation: Self Fed;Straw Other Comments: multiple swallows    Nectar Thick Nectar Thick Liquid: Not tested   Honey Thick Honey Thick Liquid: Not tested   Puree Puree: Within functional limits Presentation: Self Fed;Spoon   Solid   GO    Solid: Within functional limits Presentation: Self Lisabeth Pick, MS [facility] SLP [phone]

## 2013-06-27 NOTE — Progress Notes (Addendum)
INITIAL NUTRITION ASSESSMENT  DOCUMENTATION CODES Per approved criteria  -Severe malnutrition in the context of chronic illness -Underweight   INTERVENTION:  Magic Cups TID to maximize oral intake.  NUTRITION DIAGNOSIS: Malnutrition related to inadequate oral intake as evidenced by severe muscle loss and intake </= 75% of estimated energy intake for >/= 1 month.  Goal: Intake to meet >90% of estimated nutrition needs.  Monitor:  Diet advancement, PO intake, labs, weight trend.  Reason for Assessment: Low BMI  72 y.o. female  Admitting Dx: Seizure  ASSESSMENT: Patient was admitted on 8/23 with recurrent seizures. Patient was in the ED on 8/20 with a headache lasting several days; MRI of the brain was unremarkable at that time. CT of head on this admission showed known remote hemorrhagic infarct, without acute changes.Keppra was increased to 500 mg BID. S/P lumbar puncture 8/23; CSF was unremarkable with no signs of acute meningitis. Aspiration PNA suspected. S/P swallow evaluation with SLP this AM; recommendations for diet advancement to regular with thin liquids. Patient reports that her weight fluctuates and she has been trying to gain weight, but she eats poorly because of a poor appetite. Likes [employer].  Pt meets criteria for severe MALNUTRITION in the context of chronic illness as evidenced by severe muscle loss and intake </= 75% of estimated energy intake for >/= 1 month.  Height: Ht Readings from Last 1 Encounters:  06/25/13 5\' 5"  (1.651 m)    Weight: Wt Readings from Last 1 Encounters:  06/26/13 110 lb 7.2 oz (50.1 kg)  06/25/13 100 lb (ADMISSION WEIGHT)  Ideal Body Weight: 56.8 kg  % Ideal Body Weight: 88%  Wt Readings from Last 10 Encounters:  06/26/13 110 lb 7.2 oz (50.1 kg)  02/22/13 102 lb (46.267 kg)  12/12/12 102 lb 8 oz (46.494 kg)  12/12/12 102 lb 8 oz (46.494 kg)  10/29/12 106 lb 6.4 oz (48.263 kg)  09/27/12 104 lb (47.174 kg)  09/10/12 113 lb  15.7 oz (51.7 kg)  08/27/12 117 lb 8.1 oz (53.3 kg)  08/16/12 114 lb (51.71 kg)  08/14/12 112 lb (50.803 kg)    Usual Body Weight: 102 lb  % Usual Body Weight: 108%  BMI:  Body mass index is 18.38 kg/(m^2). Underweight  Estimated Nutritional Needs: Kcal: 1400-1600 Protein: 70-80 gm Fluid: 1.4-1.6 L  Skin: no problems  Diet Order: General  EDUCATION NEEDS: -Education not appropriate at this time   Intake/Output Summary (Last 24 hours) at 06/27/13 1442 Last data filed at 06/27/13 1200  Gross per 24 hour  Intake   1755 ml  Output   1125 ml  Net    630 ml    Last BM: 8/22   Labs:   Recent Labs Lab 06/25/13 0741 06/26/13 0750 06/27/13 0530  NA 138 141 143  K 3.8 3.7 3.8  CL 97 105 106  CO2 19 22 17*  BUN 31* 23 34*  CREATININE 1.30* 1.03 1.03  CALCIUM 8.8 8.2* 7.9*  GLUCOSE 284* 137* 158*    CBG (last 3)   Recent Labs  06/27/13 0441 06/27/13 0759 06/27/13 1226  GLUCAP 149* 120* 158*    Scheduled Meds: . fluconazole (DIFLUCAN) IV  100 mg Intravenous Q24H  . insulin aspart  0-15 Units Subcutaneous TID WC  . levETIRAcetam  500 mg Intravenous Q12H  . levofloxacin (LEVAQUIN) IV  750 mg Intravenous Q48H  . metoprolol  10 mg Intravenous Q6H  . sodium chloride  3 mL Intravenous Q12H    Continuous  Infusions: . sodium chloride      Past Medical History  Diagnosis Date  . Diabetes mellitus   . GERD (gastroesophageal reflux disease)   . Seizures   . Cataract   . Anxiety   . Stroke     Past Surgical History  Procedure Laterality Date  . Direct laryngoscopy Left 12/12/2012    Procedure: DIRECT LARYNGOSCOPY with excision of laryngeal mass;  Surgeon: Darletta Moll, MD;  Location: [facility] OR;  Service: ENT;  Laterality: Left;    Joaquin Courts, RD, LDN, CNSC Pager [phone] After Hours Pager [phone]

## 2013-06-27 NOTE — Progress Notes (Signed)
Report called to receiving nurse 4 [place], husband aware of patient being transferring.  No complaints.  Foley cath dc'd intact.

## 2013-06-27 NOTE — Progress Notes (Signed)
Utilization review completed.  

## 2013-06-27 NOTE — Progress Notes (Signed)
NEURO HOSPITALIST PROGRESS NOTE   SUBJECTIVE:                                                                                                                         Patient has no complaints, No HA, No further seizures.  Able to follow commands.  OBJECTIVE:                                                                                                                           Vital signs in last 24 hours: Temp:  [98.1 F (36.7 C)-98.5 F (36.9 C)] 98.1 F (36.7 C) (08/25 0800) Pulse Rate:  [100-115] 106 (08/25 0800) Resp:  [14-20] 19 (08/25 0800) BP: (131-151)/(61-80) 141/64 mmHg (08/25 0800) SpO2:  [98 %-100 %] 100 % (08/25 0800)  Intake/Output from previous day: 08/24 0701 - 08/25 0700 In: 2560 [I.V.:2150; IV Piggyback:410] Out: 1275 [Urine:1275] Intake/Output this shift: Total I/O In: 100 [I.V.:100] Out: 225 [Urine:225] Nutritional status: NPO  Past Medical History  Diagnosis Date  . Diabetes mellitus   . GERD (gastroesophageal reflux disease)   . Seizures   . Cataract   . Anxiety   . Stroke      Neurologic Exam:  Mental Status: Alert, oriented. Speech fluent without evidence of aphasia.  Able to follow 3 step commands without difficulty. 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  Minimal AJ Plantars: Mute bilaterally Cerebellar: normal  finger-to-nose,   CV: pulses palpable throughout    Lab Results: Lab Results  Component Value Date/Time   CHOL 155 12/12/2012  1:41 AM   Lipid Panel No results found for this basename: CHOL, TRIG, HDL, CHOLHDL, VLDL, LDLCALC,  in the last 72 hours  Studies/Results: Dg Chest Port 1 View  06/25/2013   CLINICAL DATA:  Fever.  EXAM: PORTABLE CHEST - 1 VIEW  COMPARISON:  12/11/2012.  FINDINGS: Normal sized heart. Clear lungs. The lungs remain hyperexpanded. Unremarkable bones.  IMPRESSION: No acute abnormality. Stable changes of COPD.   Electronically Signed   By: Gordan Payment   On: 06/25/2013 19:02    MEDICATIONS                                                                                                                        Scheduled: . fluconazole (DIFLUCAN) IV  100 mg Intravenous Q24H  . insulin aspart  0-15 Units Subcutaneous Q4H  . levETIRAcetam  500 mg Intravenous Q12H  . levofloxacin (LEVAQUIN) IV  750 mg Intravenous Q48H  . metoprolol  5 mg Intravenous Q6H  . sodium chloride  3 mL Intravenous Q12H   Lumbar puncture was performed on 06/25/2013 following development of fever, thought to most likely be secondary to aspiration pneumonia. However, given her recent history of severe headache and recurrent seizures, meningitis cannot be ruled out. CSF was unremarkable with no signs of acute meningitis.  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  Controlled while in hospital and on Keppra 500 mg BID. Currently on Levaquin for possible aspiration PNA.    Recommend: 1) Continue Keppra 500 mg BID, may change to PO when able to switch to oral medications.   No further recommendations.   Assessment and plan discussed with with attending physician and they are in agreement.    Felicie Morn PA-C [facility] [phone]  06/27/2013, 10:03 AM

## 2013-06-27 NOTE — ED Provider Notes (Signed)
I saw and evaluated the patient, reviewed the resident's note and I agree with the findings and plan.  Please see my separate note regarding my evaluation of the patient.  Clinical Impression:  Headache   Vida Roller, MD 06/27/13 [phone]

## 2013-06-28 ENCOUNTER — Inpatient Hospital Stay (HOSPITAL_COMMUNITY): Payer: Medicare Other

## 2013-06-28 LAB — GLUCOSE, CAPILLARY: Glucose-Capillary: 206 mg/dL — ABNORMAL HIGH (ref 70–99)

## 2013-06-28 LAB — BASIC METABOLIC PANEL
CO2: 18 mEq/L — ABNORMAL LOW (ref 19–32)
Calcium: 7.8 mg/dL — ABNORMAL LOW (ref 8.4–10.5)
GFR calc Af Amer: 67 mL/min — ABNORMAL LOW (ref >90)
GFR calc non Af Amer: 58 mL/min — ABNORMAL LOW (ref >90)
Sodium: 139 mEq/L (ref 135–145)

## 2013-06-28 LAB — LEVETIRACETAM LEVEL: Levetiracetam Lvl: 17 ug/mL (ref 5.0–30.0)

## 2013-06-28 LAB — TROPONIN I: Troponin I: <0.3 ng/mL (ref <0.30)

## 2013-06-28 MED ORDER — LEVETIRACETAM 500 MG PO TABS
500.0000 mg | ORAL_TABLET | Freq: Two times a day (BID) | ORAL | Status: DC
  Administered 2013-06-28 – 2013-07-08 (×20): 500 mg via ORAL
  Filled 2013-06-28 (×23): qty 1

## 2013-06-28 NOTE — Progress Notes (Signed)
[facility] PROGRESS NOTE  [name] [name] ZOX:[id number] DOB: 1941-02-03 DOA: 06/25/2013 PCP: Aida Puffer, MD  Brief narrative:  72 y.o. female with a hx of previous intracranial hemorrhage, seizures, HTN  and diabetes who presented to the ED with recurrent seizures. She presented to the ED on 8/20 with complaints of a headache lasting several days. This was associated with a left visual field cut. Neurology was consulted, and per recs, the patient underwent an MRI of the brain which was unremarkable. The patient was discharged home from the ED and treated symptomatically for her headache. On the morning of admission, ( 8/23) , the patient had a witnessed seizure. EMS was called. Upon EMS arrival, the patient was alert/oriented and the family refused transport. The pt then had another seizure and was transported to the ED. In the ED, the pt had another witnessed seizure lasting about 45sec. A CT head was done, which redemonstrated the known remote hmorrhagic infarct, without acute changes. Neurology was consulted and admitted to step down unit.  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 Patient started on Keppra 500 mg IV twice a day. Will switch to oral. Patient reportedly had a possible seizure like activity during OT evaluation this afternoon. Continue neuro checks and bedside sitter.   Fever Present on admission. Concern for aspiration pneumonia. NP unremarkable. UA negative for UTI. Culling empirically with Levaquin.  DM type 2  fsg stable.  HTN  Home meds resumed  CKD stage 3, GFR 30-59 ml/min  Renal fn stable  Diffuse T wave changes  Noted on EKG in ED. 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 Recommend regular diet   Thrush  IV Diflucan Code Status: FULL  Family Communication: Done at bedside Disposition Plan: Physical therapy decrements CIR. Seen by CIR and recommend  home health  Consultants:   Neurology   Procedures:  8/24 - LP per Neuro    Antibiotics:  Ampicillin 8/23 >> 8/24  Rocephin 8/23 >> 8/24  Vancomycin 8/23 >> 8/24  Levaquin 8/23 >>   DVT prophylaxis:  SCDs      HPI/Subjective: Patient seen and examined this morning. Remains quite confused. Was reported by nurse of having a possible seizure like activity while getting OT evaluation this afternoon .   Objective: Filed Vitals:   06/28/13 1400  BP: 154/79  Pulse: 109  Temp: 97.6 F (36.4 C)  Resp: 18    Intake/Output Summary (Last 24 hours) at 06/28/13 1718 Last data filed at 06/28/13 1248  Gross per 24 hour  Intake    240 ml  Output      0 ml  Net    240 ml   Filed Weights   06/25/13 1315 06/26/13 0438  Weight: 45.8 kg (100 lb 15.5 oz) 50.1 kg (110 lb 7.2 oz)    Exam:   General:  Elderly thin built female in bed in no acute distress, very hard of hearing  HEENT: No pallor, moist oral mucosa  Chest: Clear to auscultation bilaterally, no added sounds  CVS: Normal S1 and S2, no murmurs rub or gallop  Abdomen: Soft, nontender, nondistended, bowel sounds present  Extremities: Warm, no edema  CNS: AAO x1, nonfocal    Data Reviewed: Basic Metabolic Panel:  Recent Labs Lab 06/22/13 1442 06/25/13 0741 06/26/13 0750 06/27/13 0530 06/28/13 0508  NA 134* 138 141 143 139  K 4.7 3.8 3.7 3.8 3.5  CL 96 97 105 106 104  CO2 22 19  22 17* 18*  GLUCOSE 311* 284* 137* 158* 220*  BUN 38* 31* 23 34* 24*  CREATININE 1.20* 1.30* 1.03 1.03 0.96  CALCIUM 8.3* 8.8 8.2* 7.9* 7.8*   Liver Function Tests:  Recent Labs Lab 06/22/13 1442 06/25/13 0741 06/26/13 0750 06/27/13 0530  AST 22 28 28 31   ALT 16 19 16 18   ALKPHOS 92 92 68 64  BILITOT 0.5 0.7 0.4 0.3  PROT 7.2 7.4 6.7 6.4  ALBUMIN 3.7 3.6 3.1* 2.9*   No results found for this basename: LIPASE, AMYLASE,  in the last 168 hours No results found for this basename: AMMONIA,  in the last 168 hours CBC:  Recent Labs Lab  06/22/13 1442 06/25/13 0741 06/26/13 0545 06/27/13 0530 06/27/13 1130  WBC 12.5* 12.2* 11.2* 13.7* 13.3*  NEUTROABS 6.9 7.8*  --   --   --   HGB 12.5 12.4 11.2* 11.5* 11.1*  HCT 36.3 36.9 33.7* 35.0* 32.6*  MCV 95.5 96.6 96.6 97.8 95.9  PLT 236 243 203 203 222   Cardiac Enzymes:  Recent Labs Lab 06/22/13 1557 06/27/13 0530 06/27/13 1130 06/28/13 0508  CKTOTAL  --  347*  --   --   TROPONINI <0.30  --  <0.30 <0.30   BNP (last 3 results) No results found for this basename: PROBNP,  in the last 8760 hours CBG:  Recent Labs Lab 06/27/13 1554 06/27/13 1647 06/28/13 0730 06/28/13 1125 06/28/13 1647  GLUCAP 299* 334* 206* 200* 360*    Recent Results (from the past 240 hour(s))  MRSA PCR SCREENING     Status: None   Collection Time    06/25/13  1:25 PM      Result Value Range Status   MRSA by PCR NEGATIVE  NEGATIVE Final   Comment:            The GeneXpert MRSA Assay (FDA     approved for NASAL specimens     only), is one component of a     comprehensive MRSA colonization     surveillance program. It is not     intended to diagnose MRSA     infection nor to guide or     monitor treatment for     MRSA infections.  CULTURE, BLOOD (ROUTINE X 2)     Status: None   Collection Time    06/25/13  4:15 PM      Result Value Range Status   Specimen Description BLOOD RIGHT ARM   Final   Special Requests     Final   Value: BOTTLES DRAWN AEROBIC AND ANAEROBIC 10CC BLUE,5CC RED   Culture  Setup Time     Final   Value: 06/25/2013 22:13     Performed at [facility]   Culture     Final   Value:        BLOOD CULTURE RECEIVED NO GROWTH TO DATE CULTURE WILL BE HELD FOR 5 DAYS BEFORE ISSUING A FINAL NEGATIVE REPORT     Performed at [facility]   Report Status PENDING   Incomplete  CULTURE, BLOOD (ROUTINE X 2)     Status: None   Collection Time    06/25/13  4:30 PM      Result Value Range Status   Specimen Description BLOOD RIGHT HAND   Final   Special  Requests BOTTLES DRAWN AEROBIC ONLY 10CC   Final   Culture  Setup Time     Final   Value: 06/25/2013 22:13  Performed at [facility]     Final   Value:        BLOOD CULTURE RECEIVED NO GROWTH TO DATE CULTURE WILL BE HELD FOR 5 DAYS BEFORE ISSUING A FINAL NEGATIVE REPORT     Performed at [facility]   Report Status PENDING   Incomplete  GRAM STAIN     Status: None   Collection Time    06/25/13  7:58 PM      Result Value Range Status   Specimen Description CSF   Final   Special Requests TUBE 2   Final   Gram Stain     Final   Value: CYTOSPIN SLIDE     WBC PRESENT, PREDOMINANTLY MONONUCLEAR     NO ORGANISMS SEEN   Report Status 06/25/2013 FINAL   Final  CSF CULTURE     Status: None   Collection Time    06/25/13  7:58 PM      Result Value Range Status   Specimen Description CSF   Final   Special Requests TUBE 2   Final   Gram Stain     Final   Value: WBC PRESENT, PREDOMINANTLY MONONUCLEAR     NO ORGANISMS SEEN     CYTOSPIN Performed at [facility]     Performed at [facility]   Culture     Final   Value: NO GROWTH 2 DAYS     Performed at [facility]   Report Status PENDING   Incomplete     Studies: Dg Chest Port 1 View  06/28/2013   *RADIOLOGY REPORT*  Clinical Data: Seizures, suspected aspiration event  PORTABLE CHEST - 1 VIEW  Comparison: Prior chest x-ray 06/25/2013  Findings: Single frontal view of the chest demonstrates no significant new airspace disease.  Inspiratory volumes are slightly lower and there may be minimal patchy opacity in the right base. Cardiac and mediastinal contours remain within normal limits. Atherosclerotic calcifications noted in the transverse aorta. Chronic central bronchitic changes are similar compared to prior. No pneumothorax.  No acute osseous abnormality.  IMPRESSION:  Slightly lower inspiratory volumes with trace right basilar opacity which may reflect atelectasis or minimal infiltrate.    Original Report Authenticated By: Malachy Moan, M.D.    Scheduled Meds: . fluconazole (DIFLUCAN) IV  100 mg Intravenous Q24H  . insulin aspart  0-15 Units Subcutaneous TID WC  . levETIRAcetam  500 mg Intravenous Q12H  . levofloxacin (LEVAQUIN) IV  750 mg Intravenous Q48H  . metoprolol  10 mg Intravenous Q6H  . sodium chloride  3 mL Intravenous Q12H   Continuous Infusions: . sodium chloride 125 mL/hr at 06/28/13 0021      Time spent: 25 minutes    Vincient Vanaman  [facility] Pager [phone] If 7PM-7AM, please contact night-coverage at [email or web address], password [facility] 06/28/2013, 5:18 PM  LOS: 3 days

## 2013-06-28 NOTE — Progress Notes (Signed)
Inpatient Diabetes Program Recommendations  AACE/ADA: New Consensus Statement on Inpatient Glycemic Control (2013)  Target Ranges:  Prepandial:   less than 140 mg/dL      Peak postprandial:   less than 180 mg/dL (1-2 hours)      Critically ill patients:  140 - 180 mg/dL   Results for SHONDREA, STEINERT (MRN [id number]) as of 06/28/2013 10:56  Ref. Range 06/27/2013 07:59 06/27/2013 12:26 06/27/2013 15:54 06/27/2013 16:47 06/28/2013 07:30  Glucose-Capillary Latest Range: 70-99 mg/dL 829 (H) 562 (H) 130 (H) 334 (H) 206 (H)   Inpatient Diabetes Program Recommendations Insulin - Basal: If patient is eating and tolerating diet, may want to consider ordering low dose basal insulin; recommend starting with half of home dose which would be Levemir 4 units daily Correction (SSI): Please consider adding Novolog bedtime correction scale. HgbA1C: Please consider ordering an A1C to determine glycemic control over the past 2-3 months.  Last A1C was 8.8% on 12/12/2012.  Note: Patient has a history of diabetes and takes Levemir 8 units QAM, Metformin 500 mg BID, and Januvia 50 mg daily as an outpatient for diabetes management.  Currently, patient is ordered to receive Novolog 0-15 units AC for inpatient glycemic control.  Last A1C was 8.8% on 12/12/2012 and initial lab glucose was 284 mg/dl on 8/65/78 @ 4:69.  Noted that patient was receiving Dexamethasone which has been discontinued and last dose was given on 8/24 at 11:38.  Please consider adding Novolog bedtime correction and ordering an A1C.  If patient is eating and tolerating diet, may want to consider ordering low dose basal insulin; recommend starting with half of home dose and adjusting accordingly.  Will continue to follow as an inpatient.    Thanks, Orlando Penner, RN, MSN, CCRN Diabetes Coordinator Inpatient Diabetes Program [phone]

## 2013-06-28 NOTE — Progress Notes (Signed)
NEURO HOSPITALIST PROGRESS NOTE   SUBJECTIVE:                                                                                                                         No further seizure activity. remains HA free.    OBJECTIVE:                                                                                                                           Vital signs in last 24 hours: Temp:  [97 F (36.1 C)-98.2 F (36.8 C)] 98.2 F (36.8 C) (08/26 0956) Pulse Rate:  [85-114] 105 (08/26 0956) Resp:  [16-19] 18 (08/26 0956) BP: (138-190)/(57-109) 157/86 mmHg (08/26 0956) SpO2:  [96 %-100 %] 96 % (08/26 0956)  Intake/Output from previous day: 08/25 0701 - 08/26 0700 In: 100 [I.V.:100] Out: 425 [Urine:425] Intake/Output this shift:   Nutritional status: Carb Control  Past Medical History  Diagnosis Date  . Diabetes mellitus   . GERD (gastroesophageal reflux disease)   . Seizures   . Cataract   . Anxiety   . Stroke      Neurologic Exam:   Mental Status:  Alert, oriented. Speech fluent without evidence of aphasia. Able to follow 3 step commands without difficulty.  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 Minimal AJ  Plantars:  Mute bilaterally  Cerebellar:  normal finger-to-nose,  CV: pulses palpable throughout   Lab Results: Lab Results   Component Value Date/Time   CHOL 155 12/12/2012  1:41 AM   Lipid Panel No results found for this basename: CHOL, TRIG, HDL, CHOLHDL, VLDL, LDLCALC,  in the last 72 hours  Studies/Results: Dg Chest Port 1 View  06/28/2013   *  RADIOLOGY REPORT*  Clinical Data: Seizures, suspected aspiration event  PORTABLE CHEST - 1 VIEW  Comparison: Prior chest x-ray 06/25/2013  Findings: Single frontal view of the chest demonstrates no significant new airspace disease.  Inspiratory volumes are slightly lower and there may be minimal patchy opacity in the right base. Cardiac and mediastinal contours remain within normal limits. Atherosclerotic calcifications noted in the transverse aorta. Chronic central bronchitic changes are similar compared to prior. No pneumothorax.  No acute osseous abnormality.  IMPRESSION:  Slightly lower inspiratory volumes with trace right basilar opacity which may reflect atelectasis or minimal infiltrate.   Original Report Authenticated By: Malachy Moan, M.D.    MEDICATIONS                                                                                                                        Scheduled: . fluconazole (DIFLUCAN) IV  100 mg Intravenous Q24H  . insulin aspart  0-15 Units Subcutaneous TID WC  . levETIRAcetam  500 mg Intravenous Q12H  . levofloxacin (LEVAQUIN) IV  750 mg Intravenous Q48H  . metoprolol  10 mg Intravenous Q6H  . 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 Controlled while in hospital and on Keppra 500 mg BID. Currently on Levaquin for possible aspiration PNA.   Recommend:  1) Continue Keppra 500 mg BID, may change to PO when able to switch to oral medications. Patient to remain on Keppra 500 mg BID po at time of discharge and follow up with PCP or neurology as out patient.   No further recommendations.  Neurology S/O    Assessment and plan discussed with with attending physician and they are in agreement.    Felicie Morn PA-C [facility] [phone]  06/28/2013, 10:47 AM

## 2013-06-28 NOTE — Evaluation (Addendum)
Occupational Therapy Evaluation Patient Details Name: [name] [name] MRN: [id number] DOB: 1940-12-14 Today's Date: 06/28/2013 Time: 4098-1191 OT Time Calculation (min): 34 min  OT Assessment / Plan / Recommendation History of present illness 72 y.o. female with a hx of previous intracranial hemorrhage, seizures, htn, and diabetes who presented to the ED with recurrent seizures.   Clinical Impression   This 72 y.o. Female admitted for seizures.  Pt presents to OT with significant cognitive deficits.  She requires max A and cuing to sequence and problem solve through simple ADL tasks such as brushing teeth.  She required min A for functional mobility, but does lose balance at times requiring mod A to recover.  Pt is not safe to discharge home with spouse at her current level of functioning.  Definitely feel she is rehab appropriate - HH is  Currently NOT appropriate for this patient as her cognitive deficits are profound and she has poor safety awareness.  She would benefit from SLP consult for cognition.     Pt with possible seizure activity during OT eval.  See comments below in ADL section.  RN notified and in to asses and monitor pt.     OT Assessment  Patient needs continued OT Services    Follow Up Recommendations  CIR;SNF;Supervision/Assistance - 24 hour    Barriers to Discharge Decreased caregiver support husband can not handle pt at current level of function  Equipment Recommendations  None recommended by OT    Recommendations for Other Services Rehab consult  Frequency  Min 2X/week    Precautions / Restrictions Precautions Precautions: Fall   Pertinent Vitals/Pain     ADL  Eating/Feeding: Minimal assistance Where Assessed - Eating/Feeding: Bed level Grooming: Wash/dry hands;Teeth care;Moderate assistance (and max verbal cues) Where Assessed - Grooming: Supported standing Upper Body Bathing: Maximal assistance Where Assessed - Upper Body Bathing: Unsupported sitting Lower  Body Bathing: Maximal assistance Where Assessed - Lower Body Bathing: Supported sit to stand Upper Body Dressing: +1 Total assistance Where Assessed - Upper Body Dressing: Unsupported sitting Lower Body Dressing: Maximal assistance Where Assessed - Lower Body Dressing: Supported sit to Pharmacist, hospital: Moderate assistance (difficulty figuring out how to turn and where to sit on comm) Toilet Transfer Method: Sit to stand;Stand pivot Toilet Transfer Equipment: Comfort height toilet Toileting - Clothing Manipulation and Hygiene: Maximal assistance Where Assessed - Toileting Clothing Manipulation and Hygiene: Standing Transfers/Ambulation Related to ADLs: min A HHA.  However, pt with LOB x 4 when she becomes distracted, requiring mod assist to recover and prevent fall ADL Comments: Pt requires asssist due to severity of cognitive deficits.  While brushing teeth at sink, pt asked to sit down.  She started to flex at her hips stating her back hurt and required max verbal and tactile cues to stand upright.  Attempted to move pt to [facility] to sit, however, pt with signficant difficulty sequencing movement - required mod A.  Once pt sat, she slumped forward with decreased responsiveness, Rt gaze fixed with nystagmus present.  Bil. LEs elevated. RN notified.  Pt regained consciousness after ~2 mins    OT Diagnosis: Generalized weakness;Cognitive deficits;Disturbance of vision  OT Problem List: Decreased strength;Decreased activity tolerance;Impaired balance (sitting and/or standing);Impaired vision/perception;Decreased cognition;Decreased coordination;Decreased safety awareness;Decreased knowledge of use of DME or AE OT Treatment Interventions: Self-care/ADL training;Neuromuscular education;DME and/or AE instruction;Therapeutic activities;Cognitive remediation/compensation;Visual/perceptual remediation/compensation;Patient/family education;Balance training   OT Goals(Current goals can be found in the care  plan section) Acute Rehab OT Goals Patient Stated Goal: Pt  unable to state. Husband would like pt to come back home. OT Goal Formulation: With family Time For Goal Achievement: 07/12/13 Potential to Achieve Goals: Good ADL Goals Pt Will Perform Eating: with modified independence;sitting Pt Will Perform Grooming: with min assist;standing Pt Will Perform Upper Body Bathing: with min assist;sitting Pt Will Perform Lower Body Bathing: with min assist;sit to/from stand Pt Will Perform Upper Body Dressing: with min assist;sitting Pt Will Perform Lower Body Dressing: with min assist;sit to/from stand Pt Will Perform Toileting - Clothing Manipulation and hygiene: with min assist;sit to/from stand Additional ADL Goal #1: Pt will sustain attention to familiar ADL task x 3 mins with min verbal cues  Visit Information  Last OT Received On: 06/28/13 Assistance Needed: +1 History of Present Illness: 72 y.o. female with a hx of previous intracranial hemorrhage, seizures, htn, and diabetes who presented to the ED with recurrent seizures.       Prior Functioning     Home Living Family/patient expects to be discharged to:: Private residence Living Arrangements: Spouse/significant other Available Help at Discharge: Family;Available 24 hours/day Type of Home: House Home Access: Level entry Home Layout: One level Home Equipment: Walker - 2 wheels Additional Comments: Pt takes tub baths Prior Function Level of Independence: Needs assistance ADL's / Homemaking Assistance Needed: Needs assist for tub transfer in order to bathe sitting in tub Comments: required supervision due to visual field deficits.  Spouse reports that pt required max A with all ADLs in the week PTA Communication Communication: HOH Dominant Hand: Right         Vision/Perception Vision - History Baseline Vision: Other (comment) (Rt. HH per husband.  Chart says Lt. HH) Vision - Assessment Vision Assessment: Vision not  tested Additional Comments: Pt unable to sustain attention to participate.  She demonstrates full EOMs.  She was able to locate clock on wall (located on Lt.) with min verbal cues, but unable to read it correctly.  Pt. required max verbal and tactile cues to locate items on sink during grooming  Praxis Praxis: Impaired Praxis Impairment Details: Motor planning   Cognition  Cognition Arousal/Alertness: Awake/alert Behavior During Therapy: Flat affect Overall Cognitive Status: Impaired/Different from baseline Area of Impairment: Orientation;Attention;Memory;Following commands;Safety/judgement;Awareness;Problem solving Orientation Level: Disoriented to;Time Current Attention Level: Sustained (with mod verbal cues) Memory: Decreased short-term memory Following Commands: Follows one step commands inconsistently Safety/Judgement: Decreased awareness of deficits;Decreased awareness of safety Problem Solving: Slow processing;Decreased initiation;Difficulty sequencing;Requires verbal cues;Requires tactile cues General Comments: Pt requires step by step verbal cues to sequence and problem solve through simple ADL tasks    Extremity/Trunk Assessment Upper Extremity Assessment Upper Extremity Assessment: Overall WFL for tasks assessed Lower Extremity Assessment Lower Extremity Assessment: Defer to PT evaluation Cervical / Trunk Assessment Cervical / Trunk Assessment: Normal     Mobility Bed Mobility Bed Mobility: Supine to Sit;Sitting - Scoot to Edge of Bed Supine to Sit: 4: Min assist Sitting - Scoot to [place] of Bed: 4: Min guard Details for Bed Mobility Assistance: Increased time and step by step cues Transfers Transfers: Sit to Stand;Stand to Sit Sit to Stand: 4: Min assist;With upper extremity assist;From bed;From toilet;From chair/3-in-1;3: Mod assist Stand to Sit: 4: Min assist;3: Mod assist;With upper extremity assist;To chair/3-in-1;To toilet;To bed Details for Transfer Assistance:  Initially min A at beginning of session - pt very impulsive.  Mod A at end of session     Exercise     Balance Balance Balance Assessed: Yes Dynamic Sitting Balance Dynamic Sitting - Balance  Support: Feet unsupported;Feet supported;No upper extremity supported Dynamic Sitting - Level of Assistance: 4: Min assist;3: Mod assist Dynamic Sitting Balance - Compensations: donning socks EOB, lost balance to Rt.  Mod A to recover, pt unaware of balance loss Dynamic Standing Balance Dynamic Standing - Balance Support: No upper extremity supported Dynamic Standing - Level of Assistance: 4: Min assist;3: Mod assist Dynamic Standing - Balance Activities: Other (comment) (grooming) Dynamic Standing - Comments: variable performance - min - mod A   End of Session OT - End of Session Activity Tolerance: Treatment limited secondary to medical complications (Comment) Patient left: in bed;with call bell/phone within reach;with bed alarm set;with family/visitor present;with nursing/sitter in room Nurse Communication: Other (comment) (see ADL comments)  GO     [name] [name] M 06/28/2013, 4:49 PM

## 2013-06-28 NOTE — Progress Notes (Signed)
Pt had an episode of unresponsiveness (possible seizure)  for about 2 mts this evening while she was working with OT, then she came back to normal in few minutes. Husband mentioned its the same kind of episode what she been getting at home. Informed the attending physician, will keep close monitoring.  [place]

## 2013-06-28 NOTE — Progress Notes (Signed)
Rehab Admissions Coordinator Note:  Patient was screened by Trish Mage for appropriateness for an Inpatient Acute Rehab Consult.  Noted PT recommending CRI v HH.  At this time, we are recommending HH.  If patient does not progress well, could consider inpatient rehab consult.  Lelon Frohlich M 06/28/2013, 2:16 PM  I can be reached at [phone].

## 2013-06-28 NOTE — Evaluation (Signed)
Physical Therapy Evaluation Patient Details Name: [name] [name] MRN: [id number] DOB: [birth year] Today's Date: 06/28/2013 Time: 4098-1191 PT Time Calculation (min): 15 min  PT Assessment / Plan / Recommendation History of Present Illness  72 y.o. female with a hx of previous intracranial hemorrhage, seizures, htn, and diabetes who presented to the ED with recurrent seizures.  Clinical Impression  Pt admitted with above. Pt currently with functional limitations due to the deficits listed below (see PT Problem List).  Pt will benefit from skilled PT to increase their independence and safety with mobility to allow discharge to the venue listed below. If pt progresses quickly may be able to return home with 24 hour care.    PT Assessment  Patient needs continued PT services    Follow Up Recommendations  CIR    Does the patient have the potential to tolerate intense rehabilitation      Barriers to Discharge        Equipment Recommendations  None recommended by PT    Recommendations for Other Services     Frequency Min 3X/week    Precautions / Restrictions Precautions Precautions: Fall   Pertinent Vitals/Pain VSS      Mobility  Bed Mobility Bed Mobility: Supine to Sit;Sitting - Scoot to [place] of Bed Transfers Transfers: Sit to Stand;Stand to Sit Sit to Stand: 4: Min assist;With upper extremity assist;From bed;From toilet Stand to Sit: 4: Min assist;With upper extremity assist;To bed;To toilet Details for Transfer Assistance: Pt required constant cues to wait until lines/tubes were out of the way. Ambulation/Gait Ambulation/Gait Assistance: 4: Min assist Ambulation Distance (Feet): 200 Feet Assistive device: 1 person hand held assist Ambulation/Gait Assistance Details: Pt with unsteady gait and requires constant verbal/tactile cues to stay on task. Gait Pattern: Step-through pattern;Decreased stride length;Scissoring Gait velocity: decr    Exercises     PT Diagnosis:  Difficulty walking;Abnormality of gait;Altered mental status  PT Problem List: Decreased balance;Decreased mobility;Decreased cognition;Decreased knowledge of use of DME;Decreased safety awareness;Decreased knowledge of precautions PT Treatment Interventions: Gait training;DME instruction;Functional mobility training;Therapeutic activities;Therapeutic exercise;Balance training;Patient/family education;Cognitive remediation     PT Goals(Current goals can be found in the care plan section) Acute Rehab PT Goals Patient Stated Goal: Pt unable to state. Husband would like pt to come back home. PT Goal Formulation: With family Time For Goal Achievement: 07/05/13 Potential to Achieve Goals: Good  Visit Information  Last PT Received On: 06/28/13 Assistance Needed: +1 History of Present Illness: 72 y.o. female with a hx of previous intracranial hemorrhage, seizures, htn, and diabetes who presented to the ED with recurrent seizures.       Prior Functioning  Home Living Family/patient expects to be discharged to:: Private residence Living Arrangements: Spouse/significant other Available Help at Discharge: Family;Available 24 hours/day Type of Home: House Home Access: Level entry Home Layout: One level Home Equipment: Walker - 2 wheels Communication Communication: HOH Dominant Hand: Right    Cognition  Cognition Arousal/Alertness: Awake/alert Behavior During Therapy: Restless;Impulsive Overall Cognitive Status: Impaired/Different from baseline Area of Impairment: Orientation;Attention;Memory;Following commands;Safety/judgement;Problem solving Orientation Level: Place;Time;Situation Current Attention Level: Focused Memory: Decreased recall of precautions;Decreased short-term memory Following Commands: Follows one step commands inconsistently Safety/Judgement: Decreased awareness of safety;Decreased awareness of deficits Problem Solving: Requires verbal cues;Requires tactile cues     Extremity/Trunk Assessment Upper Extremity Assessment Upper Extremity Assessment: Defer to OT evaluation Lower Extremity Assessment Lower Extremity Assessment: Overall WFL for tasks assessed   Balance Balance Balance Assessed: Yes Static Standing Balance Static Standing - Balance Support: Right  upper extremity supported;During functional activity Static Standing - Level of Assistance: 4: Min assist  End of Session PT - End of Session Equipment Utilized During Treatment: Gait belt Activity Tolerance: Patient tolerated treatment well Patient left: in bed;with call bell/phone within reach;with nursing/sitter in room Nurse Communication: Mobility status  GP     [facility] 06/28/2013, 9:51 AM  [facility] PT [phone]

## 2013-06-29 DIAGNOSIS — E43 Unspecified severe protein-calorie malnutrition: Secondary | ICD-10-CM | POA: Diagnosis present

## 2013-06-29 DIAGNOSIS — D72829 Elevated white blood cell count, unspecified: Secondary | ICD-10-CM | POA: Diagnosis present

## 2013-06-29 LAB — GLUCOSE, CAPILLARY
Glucose-Capillary: 153 mg/dL — ABNORMAL HIGH (ref 70–99)
Glucose-Capillary: 197 mg/dL — ABNORMAL HIGH (ref 70–99)
Glucose-Capillary: 201 mg/dL — ABNORMAL HIGH (ref 70–99)
Glucose-Capillary: 212 mg/dL — ABNORMAL HIGH (ref 70–99)

## 2013-06-29 LAB — LIPID PANEL
Cholesterol: 129 mg/dL (ref 0–200)
Triglycerides: 82 mg/dL (ref <150)

## 2013-06-29 LAB — CBC
HCT: 35.2 % — ABNORMAL LOW (ref 36.0–46.0)
Hemoglobin: 11.8 g/dL — ABNORMAL LOW (ref 12.0–15.0)
MCH: 31.8 pg (ref 26.0–34.0)
MCV: 94.9 fL (ref 78.0–100.0)
RBC: 3.71 MIL/uL — ABNORMAL LOW (ref 3.87–5.11)
WBC: 14 10*3/uL — ABNORMAL HIGH (ref 4.0–10.5)

## 2013-06-29 LAB — HEMOGLOBIN A1C: Hgb A1c MFr Bld: 8.6 % — ABNORMAL HIGH (ref <5.7)

## 2013-06-29 LAB — CSF CULTURE W GRAM STAIN

## 2013-06-29 MED ORDER — INSULIN ASPART 100 UNIT/ML ~~LOC~~ SOLN
0.0000 [IU] | SUBCUTANEOUS | Status: DC
Start: 2013-06-29 — End: 2013-06-29

## 2013-06-29 MED ORDER — LORAZEPAM 2 MG/ML IJ SOLN
0.2500 mg | Freq: Once | INTRAMUSCULAR | Status: AC
  Administered 2013-06-29: 0.25 mg via INTRAVENOUS
  Filled 2013-06-29: qty 1

## 2013-06-29 MED ORDER — LABETALOL HCL 100 MG PO TABS
100.0000 mg | ORAL_TABLET | Freq: Two times a day (BID) | ORAL | Status: DC
  Administered 2013-06-29 – 2013-06-30 (×3): 100 mg via ORAL
  Filled 2013-06-29 (×4): qty 1

## 2013-06-29 MED ORDER — INSULIN DETEMIR 100 UNIT/ML ~~LOC~~ SOLN
8.0000 [IU] | Freq: Every day | SUBCUTANEOUS | Status: DC
  Administered 2013-06-29 – 2013-07-04 (×6): 8 [IU] via SUBCUTANEOUS
  Filled 2013-06-29 (×6): qty 0.08

## 2013-06-29 MED ORDER — LORAZEPAM 2 MG/ML IJ SOLN: 0.5000 mg | INTRAMUSCULAR | Status: DC | PRN

## 2013-06-29 MED ORDER — FLUCONAZOLE 100 MG PO TABS
100.0000 mg | ORAL_TABLET | ORAL | Status: DC
  Administered 2013-06-29 – 2013-07-01 (×3): 100 mg via ORAL
  Filled 2013-06-29 (×4): qty 1

## 2013-06-29 MED ORDER — INSULIN ASPART 100 UNIT/ML ~~LOC~~ SOLN
0.0000 [IU] | SUBCUTANEOUS | Status: DC
  Administered 2013-06-29 (×2): 3 [IU] via SUBCUTANEOUS
  Administered 2013-06-30: 5 [IU] via SUBCUTANEOUS
  Administered 2013-06-30: 8 [IU] via SUBCUTANEOUS
  Administered 2013-06-30: 5 [IU] via SUBCUTANEOUS
  Administered 2013-06-30: 3 [IU] via SUBCUTANEOUS
  Administered 2013-07-01 (×2): 5 [IU] via SUBCUTANEOUS
  Administered 2013-07-01 (×2): 8 [IU] via SUBCUTANEOUS
  Administered 2013-07-02: 11 [IU] via SUBCUTANEOUS
  Administered 2013-07-02 (×2): 3 [IU] via SUBCUTANEOUS
  Administered 2013-07-02 – 2013-07-03 (×2): 8 [IU] via SUBCUTANEOUS
  Administered 2013-07-03: 3 [IU] via SUBCUTANEOUS
  Administered 2013-07-03: 15 [IU] via SUBCUTANEOUS
  Administered 2013-07-03: 11 [IU] via SUBCUTANEOUS
  Administered 2013-07-04 (×2): 3 [IU] via SUBCUTANEOUS
  Administered 2013-07-04 (×2): 8 [IU] via SUBCUTANEOUS
  Administered 2013-07-05: 3 [IU] via SUBCUTANEOUS

## 2013-06-29 NOTE — Progress Notes (Signed)
Patient had  A blank stare become unresponsive for about while she was sitting on the bedside commode voiding.  RN was at her side and was talking to her when  she had this episode.  She was assisted back in bed  and regained consciousness and said that she is . O.K.  BP 123/57  HR 100 SpO2 98% on RA.  Will continue to monitor.

## 2013-06-29 NOTE — Progress Notes (Signed)
Physical Therapy Treatment Patient Details Name: [name] [name] MRN: [id number] DOB: 06-18-1941 Today's Date: 06/29/2013 Time: 4098-1191 PT Time Calculation (min): 26 min  PT Assessment / Plan / Recommendation  History of Present Illness 72 y.o. female with a hx of previous intracranial hemorrhage, seizures, htn, and diabetes who presented to the ED with recurrent seizures.   PT Comments   Pt was lethargic at beginning of session but was able to perform bed mobility, transfers and ambulation with min A. PT/OT co-treat due to seizure activity yesterday.  Pt needed to go to bathroom so ambulated there pushing IV pole but pt with flexed posture, shuffling steps and dampened responses. Pt became unresponsive as she sat onto toilet. Lasted about a minute when she began to verbalize again but when assisted to standing, again became unresponsive for close to 5 mins. Nsg notified and present. Once pt aroused, assisted to recliner and then recliner to bed. Pt did not have memory of episode. No nystagmus noted today, but difficulty to assess as pt's head down and eyes not fully open during episode. Husband reports that his is how episodes have happened at home. PT will continue to follow as pt able to tolerate.   Follow Up Recommendations  CIR     Does the patient have the potential to tolerate intense rehabilitation     Barriers to Discharge        Equipment Recommendations  None recommended by PT    Recommendations for Other Services    Frequency Min 3X/week   Progress towards PT Goals Progress towards PT goals: Progressing toward goals  Plan Current plan remains appropriate    Precautions / Restrictions Precautions Precautions: Fall Restrictions Weight Bearing Restrictions: No   Pertinent Vitals/Pain HR 125 bpm,  O2 sats 100%, BP stable    Mobility  Bed Mobility Bed Mobility: Sitting - Scoot to Edge of Bed;Sit to Supine;Supine to Sit Supine to Sit: 4: Min assist Sitting - Scoot to [place]  of Bed: 4: Min assist Sit to Supine: 1: +2 Total assist Sit to Supine: Patient Percentage: 70% Details for Bed Mobility Assistance: step by step cues and min A to push away from bed and then scoot hips out to edge. +2 given to return to bed for safety due to seizure episode. Pt lowered trunk to bed, min A to get legs into bed. Transfers Transfers: Sit to Stand;Stand to Sit Sit to Stand: 4: Min assist;From bed;From toilet Stand to Sit: 1: +2 Total assist;To toilet;To chair/3-in-1;To bed Stand to Sit: Patient Percentage: 0% Stand Pivot Transfers: 4: Min assist Details for Transfer Assistance: Initially min A for sit to stand but pt seizing with each stand to sit so tot A needed for safety.  Ambulation/Gait Ambulation/Gait Assistance: 4: Min assist Ambulation Distance (Feet): 20 Feet Assistive device: 1 person hand held assist Ambulation/Gait Assistance Details: 1 person HHA with other hand on IV pole, +1 behind for safety. vc's for upright posture. Pt was already beginning to go into flexion with gait, preceding seizure activity.  Gait Pattern: Decreased stride length;Narrow base of support;Shuffle;Trunk flexed Gait velocity: very slow Stairs: No Wheelchair Mobility Wheelchair Mobility: No    Exercises     PT Diagnosis:    PT Problem List:   PT Treatment Interventions:     PT Goals (current goals can now be found in the care plan section) Acute Rehab PT Goals Patient Stated Goal: Pt unable to state. Husband would like pt to come back home. PT Goal Formulation:  With family Time For Goal Achievement: 07/05/13 Potential to Achieve Goals: Fair  Visit Information  Last PT Received On: 06/29/13 Assistance Needed: +2 (+2 for safety due to seizure activity) PT/OT Co-Evaluation/Treatment: Yes (due to seizure activity yesterday) History of Present Illness: 72 y.o. female with a hx of previous intracranial hemorrhage, seizures, htn, and diabetes who presented to the ED with recurrent  seizures.    Subjective Data  Subjective: pt reports she is tired Patient Stated Goal: Pt unable to state. Husband would like pt to come back home.   Cognition  Cognition Arousal/Alertness: Lethargic Behavior During Therapy: Flat affect Overall Cognitive Status: Impaired/Different from baseline Area of Impairment: Orientation;Attention;Memory;Following commands;Safety/judgement;Awareness;Problem solving Current Attention Level: Sustained Memory: Decreased short-term memory Following Commands: Follows one step commands inconsistently Safety/Judgement: Decreased awareness of deficits;Decreased awareness of safety Problem Solving: Slow processing;Decreased initiation;Difficulty sequencing;Requires verbal cues;Requires tactile cues General Comments: Today pt oriented to month, year, and place before she underwent seizure. After seizure, she was able to state that she was in teh hospital    Balance  Balance Balance Assessed: Yes Dynamic Sitting Balance Dynamic Sitting - Balance Support: Feet supported;No upper extremity supported Dynamic Sitting - Level of Assistance: 4: Min assist;1: +2 Total assist Dynamic Sitting - Comments: sitting EOB, pt able to sit with min/ min-guard A but sitting on toilet, she required tot A due to seizure activity  End of Session PT - End of Session Equipment Utilized During Treatment: Gait belt Activity Tolerance: Other (comment) (limited by seizures) Patient left: in bed;with call bell/phone within reach;with family/visitor present;with nursing/sitter in room Nurse Communication: Mobility status;Other (comment) (seizures)   GP   Lyanne Co, PT  Acute Rehab Services  [phone]   Lyanne Co 06/29/2013, 3:51 PM

## 2013-06-29 NOTE — Progress Notes (Signed)
Writer called in patient's room and patient noted in bathroom with PT. Therapist stated patient just had a seizure which lasted for 30 sec. Stated patient bent head over while standing. Not witnessed by Clinical research associate. Patient alert and responding when writer in room. Will continue to monitor.

## 2013-06-29 NOTE — Progress Notes (Signed)
Pt was restless and hr on the monitor was on the 120-140's sustained. MD on call Craige Cotta NP) was notified. Ativan 0.25mg  was ordered. Will continue to monitor.

## 2013-06-29 NOTE — Progress Notes (Signed)
Occupational Therapy Treatment Patient Details Name: Adreana Coull MRN: [id number] DOB: 1941-02-04 Today's Date: 06/29/2013 Time: 4098-1191 OT Time Calculation (min): 24 min  OT Assessment / Plan / Recommendation  History of present illness 72 y.o. female with a hx of previous intracranial hemorrhage, seizures, htn, and diabetes who presented to the ED with recurrent seizures.   OT comments  Pt again with unresponsiveness during session.  Initial episode occurred as pt ambulated to BR for toilet transfer (to urinate) and lasted 45-60 seconds.  Pt then with increased responsiveness.  As pt stood to and attempted to ambulate back to bed, pt again unresponsive lasting 4 mins.  When she regained consciousness, she was oriented to person and place.  Pt assisted back to bed.  RN notified.    Follow Up Recommendations  CIR;SNF;Supervision/Assistance - 24 hour    Barriers to Discharge       Equipment Recommendations  None recommended by OT    Recommendations for Other Services Rehab consult  Frequency Min 2X/week   Progress towards OT Goals Progress towards OT goals: Not progressing toward goals - comment (due to unresponsiveness - ? seizure activity )  Plan Discharge plan remains appropriate    Precautions / Restrictions Precautions Precautions: Fall Restrictions Weight Bearing Restrictions: No   Pertinent Vitals/Pain     ADL  Toilet Transfer: +2 Total assistance Toilet Transfer: Patient Percentage: 40% Toilet Transfer Method: Sit to stand;Stand pivot Toilet Transfer Equipment: Comfort height toilet Toileting - Clothing Manipulation and Hygiene: Maximal assistance Transfers/Ambulation Related to ADLs: Pt ambulating with +2 assist due to seizure activity and safety concerns.  Pt fluctuated between performing ~75-50% with lesser performance after possible seizure activity  ADL Comments: Pt ambulated to bathroom with flexed posture (co-treat with PT due to seizure activity yesterday).  Pt  with increased trunk flexion as she was moving toward sitting on commode, required max A to sit, and pt became unresponsive. Remained unresponsive for 45-60 seconds.  Pt with improved arrousal, moved pt into standing position in attempts to walk back to bed, when pt experienced a second episode.  Pt moved to sitting position with total A and pt unresponsive x 4 mins before becoming minimally responsive - complaint of being tired.  RN notified both times and present to assist pt back to bed.  See RN notes for vitals.  Pt remained oriented to person and place     OT Diagnosis:    OT Problem List:   OT Treatment Interventions:     OT Goals(current goals can now be found in the care plan section) Acute Rehab OT Goals Patient Stated Goal: Pt unable to state. Husband would like pt to come back home. ADL Goals Pt Will Perform Eating: with modified independence;sitting Pt Will Perform Grooming: with min assist;standing Pt Will Perform Upper Body Bathing: with min assist;sitting Pt Will Perform Lower Body Bathing: with min assist;sit to/from stand Pt Will Perform Upper Body Dressing: with min assist;sitting Pt Will Perform Lower Body Dressing: with min assist;sit to/from stand Pt Will Perform Toileting - Clothing Manipulation and hygiene: with min assist;sit to/from stand Additional ADL Goal #1: Pt will sustain attention to familiar ADL task x 3 mins with min verbal cues  Visit Information  Last OT Received On: 06/29/13 Assistance Needed: +2 (+2 for safety due to seizure activity) History of Present Illness: 72 y.o. female with a hx of previous intracranial hemorrhage, seizures, htn, and diabetes who presented to the ED with recurrent seizures.    Subjective Data  Prior Functioning       Cognition  Cognition Arousal/Alertness: Lethargic Behavior During Therapy: Flat affect Overall Cognitive Status: Impaired/Different from baseline Area of Impairment:  Orientation;Attention;Memory;Following commands;Safety/judgement;Awareness;Problem solving Current Attention Level: Sustained Memory: Decreased short-term memory Following Commands: Follows one step commands inconsistently Safety/Judgement: Decreased awareness of deficits;Decreased awareness of safety Problem Solving: Slow processing;Decreased initiation;Difficulty sequencing;Requires verbal cues;Requires tactile cues General Comments: Pt oriented to person, place, time and situation today.  Pt lethargic.  Pt with possible seizure activity with activity very limited today     Mobility  Bed Mobility Bed Mobility: Sitting - Scoot to Edge of Bed;Sit to Supine;Supine to Sit Supine to Sit: 4: Min assist Sitting - Scoot to Edge of Bed: 4: Min assist Sit to Supine: 1: +2 Total assist Sit to Supine: Patient Percentage: 70% Details for Bed Mobility Assistance: step by step cues and min A to push away from bed and then scoot hips out to edge. +2 given to return to bed for safety due to seizure episode. Pt lowered trunk to bed, min A to get legs into bed. Transfers Transfers: Sit to Stand;Stand to Sit Sit to Stand: 4: Min assist;From bed;From toilet Stand to Sit: 1: +2 Total assist;To toilet;To chair/3-in-1;To bed Stand to Sit: Patient Percentage: 0% Details for Transfer Assistance: see comments under ADL section    Exercises      Balance Balance Balance Assessed: Yes Dynamic Sitting Balance Dynamic Sitting - Balance Support: Feet supported;No upper extremity supported Dynamic Sitting - Level of Assistance: 4: Min assist;1: +2 Total assist Dynamic Sitting - Comments: sitting EOB, pt able to sit with min/ min-guard A but sitting on toilet, she required tot A due to seizure activity   End of Session OT - End of Session Activity Tolerance: Treatment limited secondary to medical complications (Comment) Patient left: in bed;with call bell/phone within reach;with bed alarm set;with nursing/sitter  in room;with family/visitor present Nurse Communication: Other (comment) (unresponsiveness)  GO     Yasuko Lapage, Ursula Alert M 06/29/2013, 3:53 PM

## 2013-06-29 NOTE — Progress Notes (Signed)
Patient with tachycardic episode. Dr. Joseph Art is ware in progression. Will continue to monitor.  Sim Boast, RN 0900 06/29/13

## 2013-06-29 NOTE — Progress Notes (Signed)
Inpatient Diabetes Program Recommendations  AACE/ADA: New Consensus Statement on Inpatient Glycemic Control (2013)  Target Ranges:  Prepandial:   less than 140 mg/dL      Peak postprandial:   less than 180 mg/dL (1-2 hours)      Critically ill patients:  140 - 180 mg/dL   Reason for Visit: Results for [name] [name], [name] [name] (MRN [id number]) as of 06/29/2013 13:12  Ref. Range 06/28/2013 11:25 06/28/2013 16:47 06/28/2013 22:16 06/29/2013 06:31 06/29/2013 11:28  Glucose-Capillary Latest Range: 70-99 mg/dL 409 (H) 811 (H) 914 (H) 271 (H) 201 (H)   Note that patient was taking Levemir 8 units daily prior to admission.  Text Paged Dr. Joseph Art regarding home regimen.  Levemir 8 units ordered and Correction reduced to moderate q 4 hours.  Will follow.

## 2013-06-29 NOTE — Consult Note (Signed)
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 At that time she was on Keppra 250 mg by mouth twice a day for new onset of seizures. Patient independent prior to admission she did not drive. Admitted 06/25/2013 with witnessed seizure x2. She was loaded with Keppra 1000 mg intravenously. MRI of the brain showed no acute infarctions. Was moderate chronic small vessel changes of the cerebral hemispheric white matter as well as old hemorrhagic infarct is noted. Neurology services consulted and Keppra has been increased to 500 mg twice a day. Placed on Levaquin empirically for question concern aspiration pneumonia with urinalysis study negative. Patient is currently maintained on a regular consistency diet. Physical and occupational therapy evaluations completed 06/28/2013 with recommendations of physical medicine rehabilitation consult to consider inpatient rehabilitation services.  Review of Systems  Gastrointestinal:       GERD  Neurological: Positive for seizures and headaches.  Psychiatric/Behavioral:       Anxiety  All other systems reviewed and are negative.   Past Medical History  Diagnosis Date  . Diabetes mellitus   . GERD (gastroesophageal reflux disease)   . Seizures   . Cataract   . Anxiety   . Stroke    Past Surgical History  Procedure Laterality Date  . 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 Cancer Mother   . Cancer Father   . Diabetes Paternal Grandmother    Social History:  reports that she has  never smoked. She has never used smokeless tobacco. She reports that she does not drink alcohol or use illicit drugs. Allergies:  Allergies  Allergen Reactions  . Barbiturates     Becomes restless.  Also with all "strong" medications like sleeping pills  . Latex Itching   Medications Prior to Admission  Medication Sig Dispense Refill  . acetaminophen (TYLENOL) 325 MG tablet Take 650 mg by mouth every 6 (six) hours as needed for pain.      Marland Kitchen atorvastatin (LIPITOR) 10 MG tablet Take 10 mg by mouth daily.       . B-D ULTRAFINE III SHORT PEN 31G X 8 MM MISC       . insulin detemir (LEVEMIR) 100 UNIT/ML injection Inject 8 Units into the skin daily with breakfast.       . levETIRAcetam (KEPPRA) 250 MG tablet Take 250 mg by mouth every 12 (twelve) hours.      . metFORMIN (GLUCOPHAGE) 500 MG tablet Take 1 tablet by mouth Twice daily with meals.      . metoprolol tartrate (LOPRESSOR) 25 MG tablet Take 25 mg by mouth 2 (two) times daily.      . Multiple Vitamin (MULTIVITAMIN WITH MINERALS) TABS Take 1 tablet by mouth daily.      Marland Kitchen omeprazole (PRILOSEC) 40 MG capsule Take 40 mg by mouth 2 (two) times daily.       . sitaGLIPtin (JANUVIA) 25 MG tablet Take 50 mg by mouth daily.      . SUMAtriptan (IMITREX) 25 MG tablet Take 25 mg by mouth every 2 (two) hours as needed for migraine.      Marland Kitchen  vitamin C (ASCORBIC ACID) 500 MG tablet Take 500 mg by mouth daily.        Home: Home Living Family/patient expects to be discharged to:: Private residence Living Arrangements: Spouse/significant other Available Help at Discharge: Family;Available 24 hours/day Type of Home: House Home Access: Level entry Home Layout: One level Home Equipment: Walker - 2 wheels Additional Comments: Pt takes tub baths  Functional History: Prior Function Comments: required supervision due to visual field deficits.  Spouse reports that pt required max A with all ADLs in the week PTA Functional Status:  Mobility: Bed  Mobility Bed Mobility: Supine to Sit;Sitting - Scoot to Edge of Bed Supine to Sit: 4: Min assist Sitting - Scoot to [place] of Bed: 4: Min guard Transfers Transfers: Sit to Stand;Stand to Sit Sit to Stand: 4: Min assist;With upper extremity assist;From bed;From toilet;From chair/3-in-1;3: Mod assist Stand to Sit: 4: Min assist;3: Mod assist;With upper extremity assist;To chair/3-in-1;To toilet;To bed Ambulation/Gait Ambulation/Gait Assistance: 4: Min assist Ambulation Distance (Feet): 200 Feet Assistive device: 1 person hand held assist Ambulation/Gait Assistance Details: Pt with unsteady gait and requires constant verbal/tactile cues to stay on task. Gait Pattern: Step-through pattern;Decreased stride length;Scissoring Gait velocity: decr    ADL: ADL Eating/Feeding: Minimal assistance Where Assessed - Eating/Feeding: Bed level Grooming: Wash/dry hands;Teeth care;Moderate assistance (and max verbal cues) Where Assessed - Grooming: Supported standing Upper Body Bathing: Maximal assistance Where Assessed - Upper Body Bathing: Unsupported sitting Lower Body Bathing: Maximal assistance Where Assessed - Lower Body Bathing: Supported sit to stand Upper Body Dressing: +1 Total assistance Where Assessed - Upper Body Dressing: Unsupported sitting Lower Body Dressing: Maximal assistance Where Assessed - Lower Body Dressing: Supported sit to Pharmacist, hospital: Moderate assistance (difficulty figuring out how to turn and where to sit on comm) Toilet Transfer Method: Sit to stand;Stand pivot Toilet Transfer Equipment: Comfort height toilet Transfers/Ambulation Related to ADLs: min A HHA.  However, pt with LOB x 4 when she becomes distracted, requiring mod assist to recover and prevent fall ADL Comments: Pt requires asssist due to severity of cognitive deficits.  While brushing teeth at sink, pt asked to sit down.  She started to flex at her hips stating her back hurt and required max verbal  and tactile cues to stand upright.  Attempted to move pt to [facility] to sit, however, pt with signficant difficulty sequencing movement - required mod A.  Once pt sat, she slumped forward with decreased responsiveness, Rt gaze fixed with nystagmus present.  Bil. LEs elevated. RN notified.  Pt regained consciousness after ~2 mins  Cognition: Cognition Overall Cognitive Status: Impaired/Different from baseline Orientation Level: Oriented to person;Oriented to place Cognition Arousal/Alertness: Awake/alert Behavior During Therapy: Flat affect Overall Cognitive Status: Impaired/Different from baseline Area of Impairment: Orientation;Attention;Memory;Following commands;Safety/judgement;Awareness;Problem solving Orientation Level: Disoriented to;Time Current Attention Level: Sustained (with mod verbal cues) Memory: Decreased short-term memory Following Commands: Follows one step commands inconsistently Safety/Judgement: Decreased awareness of deficits;Decreased awareness of safety Problem Solving: Slow processing;Decreased initiation;Difficulty sequencing;Requires verbal cues;Requires tactile cues General Comments: Pt requires step by step verbal cues to sequence and problem solve through simple ADL tasks  Blood pressure 147/82, pulse 115, temperature 98.7 F (37.1 C), temperature source Oral, resp. rate 18, height 5\' 5"  (1.651 m), weight 50.1 kg (110 lb 7.2 oz), SpO2 100.00%. Physical Exam  Vitals reviewed. Constitutional: No distress.  HENT:  Head: Normocephalic and atraumatic.  Eyes:  Pupils round and reactive to light without nystagmus  Neck: Normal range of motion. Neck  supple. No tracheal deviation present. No thyromegaly present.  Cardiovascular: Normal rate and regular rhythm.   Pulmonary/Chest: Effort normal and breath sounds normal. No respiratory distress.  Abdominal: Soft. Bowel sounds are normal. She exhibits no distension. There is no tenderness. There is no rebound.  Neurological:   Patient was lethargic but arousable. Pleasantly confused she stated she was at home and could not name the hospital. She did follow simple one and two-step commands. LUE 4/5 with decreased motor planning. LLE 3 to 3+ and inconsistent due to delayed motor planning. Sensation 1/2 left arm and leg. Mild left facial droop. Speech dysarthric.  Skin: Skin is warm. She is not diaphoretic.  Psychiatric:  Flat, but cooperative    Results for orders placed during the hospital encounter of 06/25/13 (from the past 24 hour(s))  GLUCOSE, CAPILLARY     Status: Abnormal   Collection Time    06/28/13  4:47 PM      Result Value Range   Glucose-Capillary 360 (*) 70 - 99 mg/dL   Comment 1 Documented in Chart     Comment 2 Notify RN    GLUCOSE, CAPILLARY     Status: Abnormal   Collection Time    06/28/13 10:16 PM      Result Value Range   Glucose-Capillary 131 (*) 70 - 99 mg/dL  CBC     Status: Abnormal   Collection Time    06/29/13  5:40 AM      Result Value Range   WBC 14.0 (*) 4.0 - 10.5 K/uL   RBC 3.71 (*) 3.87 - 5.11 MIL/uL   Hemoglobin 11.8 (*) 12.0 - 15.0 g/dL   HCT 16.1 (*) 09.6 - 04.5 %   MCV 94.9  78.0 - 100.0 fL   MCH 31.8  26.0 - 34.0 pg   MCHC 33.5  30.0 - 36.0 g/dL   RDW 40.9  81.1 - 91.4 %   Platelets 221  150 - 400 K/uL  GLUCOSE, CAPILLARY     Status: Abnormal   Collection Time    06/29/13  6:31 AM      Result Value Range   Glucose-Capillary 271 (*) 70 - 99 mg/dL  GLUCOSE, CAPILLARY     Status: Abnormal   Collection Time    06/29/13 11:28 AM      Result Value Range   Glucose-Capillary 201 (*) 70 - 99 mg/dL   Comment 1 Notify RN     Dg Chest Port 1 View  06/28/2013   *RADIOLOGY REPORT*  Clinical Data: Seizures, suspected aspiration event  PORTABLE CHEST - 1 VIEW  Comparison: Prior chest x-ray 06/25/2013  Findings: Single frontal view of the chest demonstrates no significant new airspace disease.  Inspiratory volumes are slightly lower and there may be minimal patchy opacity  in the right base. Cardiac and mediastinal contours remain within normal limits. Atherosclerotic calcifications noted in the transverse aorta. Chronic central bronchitic changes are similar compared to prior. No pneumothorax.  No acute osseous abnormality.  IMPRESSION:  Slightly lower inspiratory volumes with trace right basilar opacity which may reflect atelectasis or minimal infiltrate.   Original Report Authenticated By: Malachy Moan, M.D.    Assessment/Plan: Diagnosis: new onset seizures/ urosepsis, deconditioning with a history of a right TP ICH. 1. Does the need for close, 24 hr/day medical supervision in concert with the patient's rehab needs make it unreasonable for this patient to be served in a less intensive setting? Yes 2. Co-Morbidities requiring supervision/potential complications: dm, htn, ckd, gerd  3. Due to bladder management, bowel management, safety, skin/wound care, disease management, medication administration, pain management and patient education, does the patient require 24 hr/day rehab nursing? Yes 4. Does the patient require coordinated care of a physician, rehab nurse, PT (1-2 hrs/day, 5 days/week), OT (1-2 hrs/day, 5 days/week) and SLP (1-2 hrs/day, 5 days/week) to address physical and functional deficits in the context of the above medical diagnosis(es)? Yes Addressing deficits in the following areas: balance, endurance, locomotion, strength, transferring, bowel/bladder control, bathing, dressing, feeding, grooming, toileting, cognition and psychosocial support 5. Can the patient actively participate in an intensive therapy program of at least 3 hrs of therapy per day at least 5 days per week? Yes 6. The potential for patient to make measurable gains while on inpatient rehab is good 7. Anticipated functional outcomes upon discharge from inpatient rehab are mod I to supervision with PT, supervision with OT, supervision with SLP. 8. Estimated rehab length of stay to reach  the above functional goals is: 10-12 days 9. Does the patient have adequate social supports to accommodate these discharge functional goals? Yes 10. Anticipated D/C setting: Home 11. Anticipated post D/C treatments: HH therapy 12. Overall Rehab/Functional Prognosis: excellent  RECOMMENDATIONS: This patient's condition is appropriate for continued rehabilitative care in the following setting: CIR Patient has agreed to participate in recommended program. Yes Note that insurance prior authorization may be required for reimbursement for recommended care.  Comment: Pt had recovered quite nicely from her right ICH prior to this admission. Now with increased weakness beyond baseline due to seizures, urosepsis, and multiple medical issues. Rehab RN to follow up.   Ranelle Oyster, MD, [facility] Physical Medicine & Rehabilitation     06/29/2013

## 2013-06-29 NOTE — Progress Notes (Signed)
Writer called again to patient's room and noted patient sitting on toilet sit. Therapist stated patient is having another seizure which lasted for 4 mins. Patient noted lethargic but slowly responding. Was able to tell where she is and that she was tired. Assisted back to bed and patient went straight to sleep. Resting quietly in bed at this time with eyes closed. Will notify MD of episodes.

## 2013-06-29 NOTE — Progress Notes (Signed)
[facility] PROGRESS NOTE  [name] [name] GNF:[id number] DOB: April 29, 1941 DOA: 06/25/2013 PCP: Aida Puffer, MD  Brief narrative:  72 y.o. female with a hx of previous intracranial hemorrhage, seizures, HTN  and diabetes who presented to the ED with recurrent seizures. She presented to the ED on 8/20 with complaints of a headache lasting several days. This was associated with a left visual field cut. Neurology was consulted, and per recs, the patient underwent an MRI of the brain which was unremarkable. The patient was discharged home from the ED and treated symptomatically for her headache. On the morning of admission, ( 8/23) , the patient had a witnessed seizure. EMS was called. Upon EMS arrival, the patient was alert/oriented and the family refused transport. The pt then had another seizure and was transported to the ED. In the ED, the pt had another witnessed seizure lasting about 45sec. A CT head was done, which redemonstrated the known remote hmorrhagic infarct, without acute changes. Neurology was consulted and admitted to step down unit. TODAY patient sleepy, but arousable answers questions appropriately. Husband at bedside stated that a week prior to hospitalization episodes of passing out in the bathroom, and would be confused when she regained consciousness (sounds postictal). States prior to hospitalization last generalized seizure 10 months ago in October 2013. States yesterday again had an episode of passing out while in the bathroom (was not having BM) husband unsure whether she was more confused after she regained consciousness. 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 Continue Keppra 500 mg by mouth twice a day discharge home. NOTE /OT both recommend CIR vs SNF.   -- ReConsult for inpatient neuro rehabilitation placed. Believe Pt would do better w/ a course of Inpatient  Rehab   Fever/leukocytosis Present on admission. Concern for aspiration pneumonia. The levofloxacin day 5/7. Continued high WBC. Aspiration pneumonia vs Demargination from seizures   DM type 2  CBGs are high, restart Levemir 8 units each bedtime, SSI to moderate scale. Obtain hemoglobin A1c. Obtain lipid panel  HTN  DC IV metoprolol and start labetalol 100 mg twice a day  CKD stage 3, GFR 30-59 ml/min  Renal fn stable  Diffuse T wave changes  Noted on EKG in ED. 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 Recommend regular diet   Thrush  IV Diflucan   Anxiety/agitation; when necessary Ativan    Code Status: FULL  Family Communication: Spoke with husband concerning plan of care Done at bedside Disposition Plan: Physical therapy decrements CIR. Seen by CIR and recommend  home health  Consultants:  Neurology   Procedures:  8/24 - LP per Neuro  CSF culture negative CSF Gram stain Negative CSF cell count negative CSF glucose high   Procedure CT head without contrast 06/22/2013 IMPRESSION:  1. No acute intracranial findings.  2. Known remote hemorrhagic infarction in the right posteromedial  temporal lobe.  3. Periventricular white matter and corona radiata hypodensities  favor chronic ischemic microvascular white matter disease.  MRI brain without contrast 06/22/2013 IMPRESSION:  No acute infarction. Moderate chronic small vessel changes of the  cerebral hemispheric white matter. Old hemorrhagic infarction in  the right posteromedial temporal lobe that could effect the optic  radiations on the right.    Antibiotics:  Ampicillin 8/23 >> 8/24  Rocephin 8/23 >> 8/24  Vancomycin 8/23 >> 8/24  Levaquin 8/23, day 5/7   DVT prophylaxis:  SCDs      Filed Vitals:   06/29/13 0930  BP: 167/98  Pulse: 115  Temp: 98.7 F (37.1 C)  Resp: 18    Intake/Output Summary (Last 24 hours) at 06/29/13 1343 Last data filed at  06/29/13 1610  Gross per 24 hour  Intake    653 ml  Output      0 ml  Net    653 ml   Filed Weights   06/25/13 1315 06/26/13 0438  Weight: 45.8 kg (100 lb 15.5 oz) 50.1 kg (110 lb 7.2 oz)    Exam:   General:  Elderly thin built female in bed in no acute distress, sleepy but arousable answers questions appropriately and follows orders   Cardiovascular; regular rhythm and rate, negative murmurs rubs gallops, DP/PT pulse 1+ bilateral  Chest: Clear to auscultation bilaterally  Abdomen: Soft, nontender, nondistended, bowel sounds present  Extremities: Warm, no edema  CNS: Alert and oriented x3, cranial nerves II through XII intact    Data Reviewed: Basic Metabolic Panel:  Recent Labs Lab 06/22/13 1442 06/25/13 0741 06/26/13 0750 06/27/13 0530 06/28/13 0508  NA 134* 138 141 143 139  K 4.7 3.8 3.7 3.8 3.5  CL 96 97 105 106 104  CO2 22 19 22  17* 18*  GLUCOSE 311* 284* 137* 158* 220*  BUN 38* 31* 23 34* 24*  CREATININE 1.20* 1.30* 1.03 1.03 0.96  CALCIUM 8.3* 8.8 8.2* 7.9* 7.8*   Liver Function Tests:  Recent Labs Lab 06/22/13 1442 06/25/13 0741 06/26/13 0750 06/27/13 0530  AST 22 28 28 31   ALT 16 19 16 18   ALKPHOS 92 92 68 64  BILITOT 0.5 0.7 0.4 0.3  PROT 7.2 7.4 6.7 6.4  ALBUMIN 3.7 3.6 3.1* 2.9*   No results found for this basename: LIPASE, AMYLASE,  in the last 168 hours No results found for this basename: AMMONIA,  in the last 168 hours CBC:  Recent Labs Lab 06/22/13 1442 06/25/13 0741 06/26/13 0545 06/27/13 0530 06/27/13 1130 06/29/13 0540  WBC 12.5* 12.2* 11.2* 13.7* 13.3* 14.0*  NEUTROABS 6.9 7.8*  --   --   --   --   HGB 12.5 12.4 11.2* 11.5* 11.1* 11.8*  HCT 36.3 36.9 33.7* 35.0* 32.6* 35.2*  MCV 95.5 96.6 96.6 97.8 95.9 94.9  PLT 236 243 203 203 222 221   Cardiac Enzymes:  Recent Labs Lab 06/22/13 1557 06/27/13 0530 06/27/13 1130 06/28/13 0508  CKTOTAL  --  347*  --   --   TROPONINI <0.30  --  <0.30 <0.30   BNP (last 3  results) No results found for this basename: PROBNP,  in the last 8760 hours CBG:  Recent Labs Lab 06/28/13 1125 06/28/13 1647 06/28/13 2216 06/29/13 0631 06/29/13 1128  GLUCAP 200* 360* 131* 271* 201*    Recent Results (from the past 240 hour(s))  MRSA PCR SCREENING     Status: None   Collection Time    06/25/13  1:25 PM      Result Value Range Status   MRSA by PCR NEGATIVE  NEGATIVE Final   Comment:            The GeneXpert MRSA Assay (FDA     approved for NASAL specimens     only), is one component of a     comprehensive MRSA colonization     surveillance program. It is not     intended to diagnose MRSA     infection nor to guide or  monitor treatment for     MRSA infections.  CULTURE, BLOOD (ROUTINE X 2)     Status: None   Collection Time    06/25/13  4:15 PM      Result Value Range Status   Specimen Description BLOOD RIGHT ARM   Final   Special Requests     Final   Value: BOTTLES DRAWN AEROBIC AND ANAEROBIC 10CC BLUE,5CC RED   Culture  Setup Time     Final   Value: 06/25/2013 22:13     Performed at [facility]   Culture     Final   Value:        BLOOD CULTURE RECEIVED NO GROWTH TO DATE CULTURE WILL BE HELD FOR 5 DAYS BEFORE ISSUING A FINAL NEGATIVE REPORT     Performed at [facility]   Report Status PENDING   Incomplete  CULTURE, BLOOD (ROUTINE X 2)     Status: None   Collection Time    06/25/13  4:30 PM      Result Value Range Status   Specimen Description BLOOD RIGHT HAND   Final   Special Requests BOTTLES DRAWN AEROBIC ONLY 10CC   Final   Culture  Setup Time     Final   Value: 06/25/2013 22:13     Performed at [facility]   Culture     Final   Value:        BLOOD CULTURE RECEIVED NO GROWTH TO DATE CULTURE WILL BE HELD FOR 5 DAYS BEFORE ISSUING A FINAL NEGATIVE REPORT     Performed at [facility]   Report Status PENDING   Incomplete  GRAM STAIN     Status: None   Collection Time    06/25/13  7:58 PM      Result  Value Range Status   Specimen Description CSF   Final   Special Requests TUBE 2   Final   Gram Stain     Final   Value: CYTOSPIN SLIDE     WBC PRESENT, PREDOMINANTLY MONONUCLEAR     NO ORGANISMS SEEN   Report Status 06/25/2013 FINAL   Final  CSF CULTURE     Status: None   Collection Time    06/25/13  7:58 PM      Result Value Range Status   Specimen Description CSF   Final   Special Requests TUBE 2   Final   Gram Stain     Final   Value: WBC PRESENT, PREDOMINANTLY MONONUCLEAR     NO ORGANISMS SEEN     CYTOSPIN Performed at [facility]     Performed at [facility]   Culture     Final   Value: NO GROWTH 3 DAYS     Performed at [facility]   Report Status 06/29/2013 FINAL   Final     Studies: Dg Chest Port 1 View  06/28/2013   *RADIOLOGY REPORT*  Clinical Data: Seizures, suspected aspiration event  PORTABLE CHEST - 1 VIEW  Comparison: Prior chest x-ray 06/25/2013  Findings: Single frontal view of the chest demonstrates no significant new airspace disease.  Inspiratory volumes are slightly lower and there may be minimal patchy opacity in the right base. Cardiac and mediastinal contours remain within normal limits. Atherosclerotic calcifications noted in the transverse aorta. Chronic central bronchitic changes are similar compared to prior. No pneumothorax.  No acute osseous abnormality.  IMPRESSION:  Slightly lower inspiratory volumes with trace right basilar opacity  which may reflect atelectasis or minimal infiltrate.   Original Report Authenticated By: Malachy Moan, M.D.    Scheduled Meds: . fluconazole (DIFLUCAN) IV  100 mg Intravenous Q24H  . insulin aspart  0-15 Units Subcutaneous Q4H  . insulin detemir  8 Units Subcutaneous QHS  . labetalol  100 mg Oral BID  . levETIRAcetam  500 mg Oral BID  . levofloxacin (LEVAQUIN) IV  750 mg Intravenous Q48H  . sodium chloride  3 mL Intravenous Q12H   Continuous Infusions: . sodium chloride 125 mL/hr at  06/29/13 0119      Time spent: 30 minutes    WOODS, Roselind Messier  [facility] Pager [phone] If 7PM-7AM, please contact night-coverage at [email or web address], password [facility] 06/29/2013, 1:43 PM  LOS: 4 days

## 2013-06-29 NOTE — Progress Notes (Signed)
ANTIBIOTIC CONSULT NOTE - FOLLOW UP  Pharmacy Consult for Levaquin Indication: aspiration pneumonia  Allergies  Allergen Reactions  . Barbiturates     Becomes restless.  Also with all "strong" medications like sleeping pills  . Latex Itching    Patient Measurements: Height: 5\' 5"  (165.1 cm) Weight: 110 lb 7.2 oz (50.1 kg) IBW/kg (Calculated) : 57  Vital Signs: Temp: 99.4 F (37.4 C) (08/27 1437) Temp src: Axillary (08/27 1437) BP: 102/61 mmHg (08/27 1535) Pulse Rate: 122 (08/27 1535) Intake/Output from previous day: 08/26 0701 - 08/27 0700 In: 683 [P.O.:680; I.V.:3] Out: -  Intake/Output from this shift: Total I/O In: 330 [P.O.:330] Out: -   Labs:  Recent Labs  06/27/13 0530 06/27/13 1130 06/28/13 0508 06/29/13 0540  WBC 13.7* 13.3*  --  14.0*  HGB 11.5* 11.1*  --  11.8*  PLT 203 222  --  221  CREATININE 1.03  --  0.96  --    Estimated Creatinine Clearance: 41.9 ml/min (by C-G formula based on Cr of 0.96). No results found for this basename: VANCOTROUGH, Leodis Binet, VANCORANDOM, GENTTROUGH, GENTPEAK, GENTRANDOM, TOBRATROUGH, TOBRAPEAK, TOBRARND, AMIKACINPEAK, AMIKACINTROU, AMIKACIN,  in the last 72 hours   Microbiology: Recent Results (from the past 720 hour(s))  MRSA PCR SCREENING     Status: None   Collection Time    06/25/13  1:25 PM      Result Value Range Status   MRSA by PCR NEGATIVE  NEGATIVE Final   Comment:            The GeneXpert MRSA Assay (FDA     approved for NASAL specimens     only), is one component of a     comprehensive MRSA colonization     surveillance program. It is not     intended to diagnose MRSA     infection nor to guide or     monitor treatment for     MRSA infections.  CULTURE, BLOOD (ROUTINE X 2)     Status: None   Collection Time    06/25/13  4:15 PM      Result Value Range Status   Specimen Description BLOOD RIGHT ARM   Final   Special Requests     Final   Value: BOTTLES DRAWN AEROBIC AND ANAEROBIC 10CC BLUE,5CC RED    Culture  Setup Time     Final   Value: 06/25/2013 22:13     Performed at [facility]   Culture     Final   Value:        BLOOD CULTURE RECEIVED NO GROWTH TO DATE CULTURE WILL BE HELD FOR 5 DAYS BEFORE ISSUING A FINAL NEGATIVE REPORT     Performed at [facility]   Report Status PENDING   Incomplete  CULTURE, BLOOD (ROUTINE X 2)     Status: None   Collection Time    06/25/13  4:30 PM      Result Value Range Status   Specimen Description BLOOD RIGHT HAND   Final   Special Requests BOTTLES DRAWN AEROBIC ONLY 10CC   Final   Culture  Setup Time     Final   Value: 06/25/2013 22:13     Performed at [facility]   Culture     Final   Value:        BLOOD CULTURE RECEIVED NO GROWTH TO DATE CULTURE WILL BE HELD FOR 5 DAYS BEFORE ISSUING A FINAL NEGATIVE REPORT     Performed at  [facility]   Report Status PENDING   Incomplete  GRAM STAIN     Status: None   Collection Time    06/25/13  7:58 PM      Result Value Range Status   Specimen Description CSF   Final   Special Requests TUBE 2   Final   Gram Stain     Final   Value: CYTOSPIN SLIDE     WBC PRESENT, PREDOMINANTLY MONONUCLEAR     NO ORGANISMS SEEN   Report Status 06/25/2013 FINAL   Final  CSF CULTURE     Status: None   Collection Time    06/25/13  7:58 PM      Result Value Range Status   Specimen Description CSF   Final   Special Requests TUBE 2   Final   Gram Stain     Final   Value: WBC PRESENT, PREDOMINANTLY MONONUCLEAR     NO ORGANISMS SEEN     CYTOSPIN Performed at [facility]     Performed at [facility]   Culture     Final   Value: NO GROWTH 3 DAYS     Performed at [facility]   Report Status 06/29/2013 FINAL   Final    Anti-infectives   Start     Dose/Rate Route Frequency Ordered Stop   06/29/13 1700  fluconazole (DIFLUCAN) tablet 100 mg     100 mg Oral Every 24 hours 06/29/13 1610     06/27/13 1000  levofloxacin (LEVAQUIN) IVPB 500 mg  Status:   Discontinued     500 mg 100 mL/hr over 60 Minutes Intravenous Every 48 hours 06/25/13 1849 06/26/13 0948   06/27/13 1000  levofloxacin (LEVAQUIN) IVPB 750 mg     750 mg 100 mL/hr over 90 Minutes Intravenous Every 48 hours 06/26/13 0948     06/26/13 1800  vancomycin (VANCOCIN) 500 mg in sodium chloride 0.9 % 100 mL IVPB  Status:  Discontinued     500 mg 100 mL/hr over 60 Minutes Intravenous Every 24 hours 06/25/13 1819 06/26/13 0948   06/26/13 1800  vancomycin (VANCOCIN) IVPB 750 mg/150 ml premix  Status:  Discontinued     750 mg 150 mL/hr over 60 Minutes Intravenous Every 24 hours 06/26/13 0948 06/26/13 1418   06/26/13 1530  fluconazole (DIFLUCAN) IVPB 100 mg  Status:  Discontinued     100 mg 50 mL/hr over 60 Minutes Intravenous Every 24 hours 06/26/13 1458 06/29/13 1609   06/26/13 1400  ampicillin (OMNIPEN) 1 g in sodium chloride 0.9 % 50 mL IVPB  Status:  Discontinued     1 g 150 mL/hr over 20 Minutes Intravenous 3 times per day 06/26/13 0948 06/26/13 1418   06/25/13 2000  levofloxacin (LEVAQUIN) IVPB 750 mg     750 mg 100 mL/hr over 90 Minutes Intravenous  Once 06/25/13 1849 06/25/13 2303   06/25/13 1930  ampicillin (OMNIPEN) 1 g in sodium chloride 0.9 % 50 mL IVPB  Status:  Discontinued     1 g 150 mL/hr over 20 Minutes Intravenous Every 12 hours 06/25/13 1841 06/26/13 0948   06/25/13 1830  ciprofloxacin (CIPRO) IVPB 400 mg  Status:  Discontinued     400 mg 200 mL/hr over 60 Minutes Intravenous Every 24 hours 06/25/13 1819 06/25/13 1849   06/25/13 1830  cefTRIAXone (ROCEPHIN) 2 g in dextrose 5 % 50 mL IVPB  Status:  Discontinued     2 g 100 mL/hr over 30 Minutes  Intravenous Every 12 hours 06/25/13 1819 06/26/13 1418   06/25/13 1830  vancomycin (VANCOCIN) IVPB 1000 mg/200 mL premix     1,000 mg 200 mL/hr over 60 Minutes Intravenous  Once 06/25/13 1819 06/25/13 2344      Assessment: 72 y/o female on day 5 Levaquin for aspiration pneumonia. WBC elevated (aspiration vs seizure),  patient is afebrile, and renal function is stable. Cultures are all negative. Plan is for 7 days of treatment.  Goal of Therapy:  Resolution of pneumonia  Plan:  - Continue Levaquin 750 mg IV q48h - Consider stop date 8/29 after dose is given  [facility] [place], [place].D., BCPS Clinical Pharmacist Pager: [phone] 06/29/2013 4:15 PM

## 2013-06-30 ENCOUNTER — Encounter (HOSPITAL_COMMUNITY): Payer: Self-pay | Admitting: Physician Assistant

## 2013-06-30 ENCOUNTER — Other Ambulatory Visit: Payer: Self-pay

## 2013-06-30 DIAGNOSIS — R5381 Other malaise: Secondary | ICD-10-CM

## 2013-06-30 DIAGNOSIS — R569 Unspecified convulsions: Secondary | ICD-10-CM

## 2013-06-30 DIAGNOSIS — R55 Syncope and collapse: Secondary | ICD-10-CM

## 2013-06-30 DIAGNOSIS — G40309 Generalized idiopathic epilepsy and epileptic syndromes, not intractable, without status epilepticus: Secondary | ICD-10-CM

## 2013-06-30 LAB — COMPREHENSIVE METABOLIC PANEL
ALT: 19 U/L (ref 0–35)
AST: 34 U/L (ref 0–37)
Albumin: 2.5 g/dL — ABNORMAL LOW (ref 3.5–5.2)
Alkaline Phosphatase: 66 U/L (ref 39–117)
Alkaline Phosphatase: 74 U/L (ref 39–117)
BUN: 12 mg/dL (ref 6–23)
CO2: 29 mEq/L (ref 19–32)
Chloride: 95 mEq/L — ABNORMAL LOW (ref 96–112)
Chloride: 96 mEq/L (ref 96–112)
Creatinine, Ser: 0.89 mg/dL (ref 0.50–1.10)
GFR calc Af Amer: 54 mL/min — ABNORMAL LOW (ref >90)
Glucose, Bld: 240 mg/dL — ABNORMAL HIGH (ref 70–99)
Potassium: 2.3 mEq/L — CL (ref 3.5–5.1)
Potassium: 3.1 mEq/L — ABNORMAL LOW (ref 3.5–5.1)
Sodium: 133 mEq/L — ABNORMAL LOW (ref 135–145)
Total Bilirubin: 0.5 mg/dL (ref 0.3–1.2)
Total Bilirubin: 0.5 mg/dL (ref 0.3–1.2)
Total Protein: 5.5 g/dL — ABNORMAL LOW (ref 6.0–8.3)
Total Protein: 5.5 g/dL — ABNORMAL LOW (ref 6.0–8.3)

## 2013-06-30 LAB — CBC WITH DIFFERENTIAL/PLATELET
Eosinophils Absolute: 0 10*3/uL (ref 0.0–0.7)
Eosinophils Relative: 0 % (ref 0–5)
HCT: 29.9 % — ABNORMAL LOW (ref 36.0–46.0)
Lymphocytes Relative: 13 % (ref 12–46)
Lymphs Abs: 2.1 10*3/uL (ref 0.7–4.0)
MCH: 31.9 pg (ref 26.0–34.0)
MCV: 93.4 fL (ref 78.0–100.0)
Monocytes Absolute: 1.8 10*3/uL — ABNORMAL HIGH (ref 0.1–1.0)
Platelets: 176 10*3/uL (ref 150–400)
RBC: 3.2 MIL/uL — ABNORMAL LOW (ref 3.87–5.11)
WBC: 15.6 10*3/uL — ABNORMAL HIGH (ref 4.0–10.5)

## 2013-06-30 LAB — PROLACTIN: Prolactin: 7.7 ng/mL

## 2013-06-30 LAB — TSH: TSH: 0.586 u[IU]/mL (ref 0.350–4.500)

## 2013-06-30 LAB — GLUCOSE, CAPILLARY
Glucose-Capillary: 164 mg/dL — ABNORMAL HIGH (ref 70–99)
Glucose-Capillary: 283 mg/dL — ABNORMAL HIGH (ref 70–99)
Glucose-Capillary: 95 mg/dL (ref 70–99)

## 2013-06-30 LAB — MAGNESIUM: Magnesium: 0.7 mg/dL — CL (ref 1.5–2.5)

## 2013-06-30 MED ORDER — MAGNESIUM SULFATE 50 % IJ SOLN
3.0000 g | Freq: Once | INTRAVENOUS | Status: AC
  Administered 2013-06-30: 3 g via INTRAVENOUS
  Filled 2013-06-30: qty 6

## 2013-06-30 MED ORDER — SODIUM CHLORIDE 0.9 % IV BOLUS (SEPSIS)
1000.0000 mL | Freq: Once | INTRAVENOUS | Status: AC
  Administered 2013-06-30: 1000 mL via INTRAVENOUS

## 2013-06-30 MED ORDER — POTASSIUM CHLORIDE 10 MEQ/100ML IV SOLN
10.0000 meq | INTRAVENOUS | Status: AC
  Administered 2013-06-30 (×3): 10 meq via INTRAVENOUS
  Filled 2013-06-30 (×4): qty 100

## 2013-06-30 MED ORDER — SODIUM CHLORIDE 0.9 % IV SOLN
INTRAVENOUS | Status: DC
  Administered 2013-06-30: 13:00:00 via INTRAVENOUS

## 2013-06-30 NOTE — Progress Notes (Signed)
OT Cancellation Note  Patient Details Name: [name] [name] MRN: [id number] DOB: 09-23-1941   Cancelled Treatment:    Reason Eval/Treat Not Completed: Medical issues which prohibited therapy - RN reporting suspected seizure activity/unresponsiveness.  Will try back tomorrow  [name] 06/30/2013, 11:51 AM

## 2013-06-30 NOTE — Progress Notes (Signed)
Patient passed out while using the bedside commode. The fainting spell lasted for about 30 seconds.  Patient regained her consciousness and assisted back to bed by RN.  BP126/82  Pulse 100 O2sat 97% on RA. Patient claimed she felt fine and went back to sleep.

## 2013-06-30 NOTE — Consult Note (Signed)
CARDIOLOGY CONSULT NOTE   Patient ID: [name] [name] MRN: [id number] DOB/AGE: Mar 19, 1941 72 y.o.  Admit date: 06/25/2013  Primary Physician   Aida Puffer, MD Primary Cardiologist   TB - he saw her in a hospital consult 08/27/2012 Reason for Consultation   Syncope  WUJ:WJXBJ Gatliff is a 72 y.o. female with a history of syncope in October 2013 while in rehab after an intracranial hemorrhage. It was felt secondary to vasovagal reaction while straining at stool. She may also have been slightly dehydrated. He recommended an outpatient event monitor if further episodes were noted.   [name] [name] was admitted 8/23 with recurrent seizures (4). Lumbar puncture was unremarkable. Her Keppra dose was increased, she is to continue this as an outpatient. She was being evaluated by Neurology today and mentioned orthostatic hypotension as well as 2 syncopal episodes the past week. During this admission, pt has had 3 episodes of decreased LOC/syncope. In all of them, she will get up to [facility] for urination and become unresponsive. There will be no seizure activity witnessed. She has not fallen. There was one today, witnessed by her RN and the IM MD. She was moaning in response to stimulus but does not remember this. Her HR was slightly tachycardic, SR on telemetry review. Her SBP was reportedly 165 or so before the incident, and 98 systolic during the incident. Her BP improved once she recovered and was lying down. She had been started on labetalol 100 mg BID yesterday, receiving a dose this am. This medication has been d/c'd.    She describes the same symptoms for each episode. IF it happens, she feels dizzy and nauseated when she first stands up. She has made it to the [facility], but will lose consciousness for a period of time. She will wake up, and take about a minute to regain full consciousness. Per her husband, each episode lasts about 5 minutes. They are all the same. She does not get symptomatic every time she gets up to  the bathroom. She has no palpitations, chest pain or SOB. She has DOE but is very unsteady on her feet (without being dizzy) and is very weak in general.   Past Medical History  Diagnosis Date  . Diabetes mellitus   . GERD (gastroesophageal reflux disease)   . Seizures   . Cataract   . Anxiety   . Stroke 2013    Secondary to acute right posterior temporo-occipital intra-axial hemorrhage     Past Surgical History  Procedure Laterality Date  . 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 Barbiturates     Becomes restless.  Also with all "strong" medications like sleeping pills  . Latex Itching    I have reviewed the patient's current medications . fluconazole  100 mg Oral Q24H  . insulin aspart  0-15 Units Subcutaneous Q4H  . insulin detemir  8 Units Subcutaneous QHS  . levETIRAcetam  500 mg Oral BID  . levofloxacin (LEVAQUIN) IV  750 mg Intravenous Q48H  . sodium chloride  3 mL Intravenous Q12H   . sodium chloride 125 mL/hr at 06/30/13 1307   acetaminophen, LORazepam, ondansetron (ZOFRAN) IV  Prior to Admission medications   Medication Sig Start Date End Date Taking? Authorizing Provider  acetaminophen (TYLENOL) 325 MG tablet Take 650 mg by mouth every 6 (six)  hours as needed for pain.   Yes Historical Provider, MD  atorvastatin (LIPITOR) 10 MG tablet Take 10 mg by mouth daily.  02/11/13  Yes Historical Provider, MD  B-D ULTRAFINE III SHORT PEN 31G X 8 MM MISC  12/07/12  Yes Historical Provider, MD  insulin detemir (LEVEMIR) 100 UNIT/ML injection Inject 8 Units into the skin daily with breakfast.  09/10/12  Yes Evlyn Kanner Love, PA-C  levETIRAcetam (KEPPRA) 250 MG tablet Take 250 mg by mouth every 12 (twelve) hours.   Yes Historical Provider, MD  metFORMIN (GLUCOPHAGE) 500 MG tablet Take 1 tablet by mouth Twice  daily with meals. 10/05/12  Yes Historical Provider, MD  metoprolol tartrate (LOPRESSOR) 25 MG tablet Take 25 mg by mouth 2 (two) times daily.   Yes Historical Provider, MD  Multiple Vitamin (MULTIVITAMIN WITH MINERALS) TABS Take 1 tablet by mouth daily.   Yes Historical Provider, MD  omeprazole (PRILOSEC) 40 MG capsule Take 40 mg by mouth 2 (two) times daily.    Yes Historical Provider, MD  sitaGLIPtin (JANUVIA) 25 MG tablet Take 50 mg by mouth daily.   Yes Historical Provider, MD  SUMAtriptan (IMITREX) 25 MG tablet Take 25 mg by mouth every 2 (two) hours as needed for migraine.   Yes Historical Provider, MD  vitamin C (ASCORBIC ACID) 500 MG tablet Take 500 mg by mouth daily.   Yes Historical Provider, MD     History   Social History  . Marital Status: Married    Spouse Name: N/A    Number of Children: N/A  . Years of Education: N/A   Occupational History  . Retired    Social History Main Topics  . Smoking status: Never Smoker   . Smokeless tobacco: Never Used  . Alcohol Use: No     Comment: patient drinks caffeinated drinks.   . Drug Use: No  . Sexual Activity: Not on file   Other Topics Concern  . Not on file   Social History Narrative   Patient lives at home with her husband and daughter and has a high school education.     Family Status  Relation Status Death Age  . Mother Deceased   . Father Deceased    Family History  Problem Relation Age of Onset  . Cancer Mother   . Cancer Father   . Diabetes Paternal Grandmother      ROS:  Full 14 point review of systems complete and found to be negative unless listed above.  Physical Exam: Blood pressure 131/49, pulse 111, temperature 97.1 F (36.2 C), temperature source Oral, resp. rate 20, height 5\' 5"  (1.651 m), weight 110 lb 7.2 oz (50.1 kg), SpO2 99.00%.  General: Well developed, well nourished, female in no acute distress Head: Eyes PERRLA, No xanthomas.   Normocephalic and atraumatic, oropharynx without edema or  exudate. Dentition: poor Lungs: CTA bilaterally except for a few rales Heart: Heart slightly irregular rate and rhythm with S1, S2; no significant murmur. pulses are 2+ all 4 extrem.   Neck: No carotid bruits. No lymphadenopathy.  JVD not elevated. Abdomen: Bowel sounds present, abdomen soft and non-tender without masses or hernias noted. Msk:  No spine or cva tenderness. Generalized weakness, no joint deformities or effusions. Extremities: No clubbing or cyanosis. No edema.  Neuro: Alert and oriented X 3. No focal deficits noted. Answers questions a little slowly. Psych:  Good affect, responds appropriately Skin: No rashes or lesions noted.  Labs:   Lab Results  Component Value  Date   WBC 15.6* 06/30/2013   HGB 10.2* 06/30/2013   HCT 29.9* 06/30/2013   MCV 93.4 06/30/2013   PLT 176 06/30/2013     Recent Labs Lab 06/30/13 1205  NA 133*  K 3.1*  CL 96  CO2 29  BUN 12  CREATININE 1.15*  CALCIUM 7.2*  PROT 5.5*  BILITOT 0.5  ALKPHOS 74  ALT 19  AST 34  GLUCOSE 240*   Magnesium  Date Value Range Status  06/30/2013 0.7* 1.5 - 2.5 mg/dL Final     CRITICAL RESULT CALLED TO, READ BACK BY AND VERIFIED WITH:     MCINTOSH H,RN AT 1312 06/30/13 BY ZPERRY.    Recent Labs  06/28/13 0508  TROPONINI <0.30   Lab Results  Component Value Date   CHOL 129 06/29/2013   HDL 86 06/29/2013   LDLCALC 27 06/29/2013   TRIG 82 06/29/2013   TSH  Date/Time Value Range Status  06/30/2013 12:05 PM 0.586  0.350 - 4.500 uIU/mL Final     Performed at [facility]    Echo: 08/24/2012 Study Conclusions - Procedure narrative: Transthoracic echocardiography. Image quality was suboptimal. The study was technically difficult, as a result of poor acoustic windows, poor sound wave transmission, poor patient compliance, and restricted patient mobility. - Left ventricle: The cavity size was normal. Wall thickness was normal. Systolic function was normal. The estimated ejection fraction was in  the range of 55% to 65%. Although no diagnostic regional wall motion abnormality was identified, this possibility cannot be completely excluded on the basis of this study. - Atrial septum: No evidence of ASD or PFO was noted in this techincally difficult, inadequate study. On the basis of this study,a septal defect or PFP cannot be completelyexcluded. Impressions: - A sinus tachycardia rhythm was noted on this study, making this study technically difficult for the assessment of cardiac function. No cardiac source of embolism was identified, but cannot be ruled out on the basis of this examination. Normal pulmonary artery pressure.   ECG:  SR, no acute ischemic changes. ST generally on telemetry with occasional PVCs and more frequent PACs but no sustained arrhythmia.  Radiology:  Ct Head Wo Contrast 06/25/2013   CLINICAL DATA:  Seizures.  History of CVA.  EXAM: CT HEAD WITHOUT CONTRAST  TECHNIQUE: Contiguous axial images were obtained from the base of the skull through the vertex without intravenous contrast.  COMPARISON:  06/22/2013  FINDINGS: The patient's known remote hemorrhagic infarction in the right posterior medial temporal lobe is only apparent as subtle hypodensity below the occipital horn of the right lateral ventricle on CT, and difficult to separate out from the underlying mild chronic ischemic microvascular white matter disease.  Otherwise, the brainstem, cerebellum, cerebral peduncles, thalamus, basal ganglia, basilar cisterns, and ventricular system appear within normal limits. No intracranial hemorrhage, mass lesion, or acute CVA. Mild chronic right sphenoid sinusitis.  IMPRESSION: 1. No acute intracranial findings. 2. 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 3. Periventricular white matter and corona radiata hypodensities favor chronic ischemic microvascular white matter disease.   Electronically Signed   By: Herbie Baltimore   On: 06/25/2013  08:59   Ct Head Wo Contrast 06/22/2013   *RADIOLOGY REPORT*  Clinical Data: Headache  CT HEAD WITHOUT CONTRAST  Technique:  Contiguous axial images were obtained from the base of the skull through the vertex without contrast.  Comparison: MRI 12/12/2012.  Findings: No acute intracranial hemorrhage.  No focal mass lesion. No CT  evidence of acute infarction.   No midline shift or mass effect.  No hydrocephalus.  Basilar cisterns are patent.  There is mild periventricular white matter hypodensities.  Mild encephalomalacia within the right occipital lobe. These findings are similar to comparison MRI.  Paranasal sinuses and mastoid air cells are clear.  Orbits are normal.  IMPRESSION:  1.  No acute intracranial findings. 2. 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   Original Report Authenticated By: Genevive Bi, M.D.   [name] 06/22/2013   *RADIOLOGY REPORT*  Clinical Data: Headache.  Left visual field cut.  MRI HEAD WITHOUT CONTRAST  Technique:  Multiplanar, multiecho pulse sequences of the brain and surrounding structures were obtained according to standard protocol without intravenous contrast.  Comparison: Head CT same day.  MRI 12/12/2012.  Findings: Diffusion imaging does not show any acute or subacute infarction.  The brainstem and cerebellum are unremarkable.  The cerebral hemispheres show moderate chronic small vessel change affecting the deep and subcortical white matter. There is an old hemorrhagic infarction in the right posteromedial temporal lobe. This was present previously and could explain the patient's symptoms.  No mass lesion, hemorrhage, hydrocephalus or extra-axial collection.  No pituitary mass.  No inflammatory sinus disease. There is a left nasal maxillary cyst which could emanate from a dental root.  IMPRESSION: No acute infarction.  Moderate chronic small vessel changes of the cerebral  hemispheric white matter. Old  hemorrhagic infarction in the right posteromedial temporal lobe that could effect the optic radiations on the right.   Original Report Authenticated By: Paulina Fusi, M.D.   Dg Chest Port 1 View 06/28/2013   *RADIOLOGY REPORT*  Clinical Data: Seizures, suspected aspiration event  PORTABLE CHEST - 1 VIEW  Comparison: Prior chest x-ray 06/25/2013  Findings: Single frontal view of the chest demonstrates no significant new airspace disease.  Inspiratory volumes are slightly lower and there may be minimal patchy opacity in the right base. Cardiac and mediastinal contours remain within normal limits. Atherosclerotic calcifications noted in the transverse aorta. Chronic central bronchitic changes are similar compared to prior. No pneumothorax.  No acute osseous abnormality.  IMPRESSION:  Slightly lower inspiratory volumes with trace right basilar opacity which may reflect atelectasis or minimal infiltrate.   Original Report Authenticated By: Malachy Moan, M.D.   Dg Chest Port 1 View 06/25/2013   CLINICAL DATA:  Fever.  EXAM: PORTABLE CHEST - 1 VIEW  COMPARISON:  12/11/2012.  FINDINGS: Normal sized heart. Clear lungs. The lungs remain hyperexpanded. Unremarkable bones.  IMPRESSION: No acute abnormality. Stable changes of COPD.   Electronically Signed   By: Gordan Payment   On: 06/25/2013 19:02    ASSESSMENT AND PLAN:   The patient was seen today by Dr. Shirlee Latch, the patient evaluated and the data reviewed.     Syncope - no significant arrhythmias during the event witnessed today, no seizure activity noted. All episodes this admission have been in the setting of micturition. Her BP was > 90 when checked but was still significantly lower than previous values. Agree with d/c'ing anti-hypertensive medications. She should avoid any medication with orthostasis as a side effect. Will add compression stockings, check a TSH and check orthostatic VS daily. PO intake is poor, her BUN/Cr were  slightly elevated on admission, raising concern she was a little dehydrated. They had improved but her Cr was trending up again, with abnormalities in her K+ (2.3)  and her Mg+ (0.7). There may be a contribution from these as well. Would keep her hydrated in addition to the compression stockings.   Otherwise, per IM. Principal Problem:   Seizure Active Problems:   DM type 2 (diabetes mellitus, type 2)   HTN (hypertension)   CKD (chronic kidney disease) stage 3, GFR 30-59 ml/min   Hemorrhage in the brain-13 x 22 x 14 mm right posterior temporo-occipital intra-axial acute   Protein-calorie malnutrition, severe   Leukocytosis, unspecified   Signed: Theodore Demark, PA-C 06/30/2013 4:36 PM Beeper [phone]  Co-Sign MD  Patient seen with PA, agree with the above note. Patient has been having seizures, but as a separate problem, appears to have episodes of presyncope with micturation.  HR does not change but BP drops considerably.  No events on telemetry during these events.  I suspect that these events are vagally mediated ("micturation syncope") primarily, but relative dehydration may contribute.   - Stop labetalol - Add compression stockings.  - Measure orthostatics daily - Replete K and Mg, check TSH.  - Agree with gentle IV fluid.   Marca Ancona 06/30/2013 4:49 PM

## 2013-06-30 NOTE — Progress Notes (Signed)
Discussed CIR w/ pt's husband [pt sound asleep]-he says he thinks "that is what she wants"-will f/up in the morning.  If pt & husband agree to CIR & bed is available, & pt is medically cleared, could admit to CIR tomorrow.  [phone]

## 2013-06-30 NOTE — Progress Notes (Signed)
While using the [facility], writer noted patient suddenly passed out. The fainting spell last approximately 5 minutes. During this time, patient does respond to voice. MD and charge nurse at bedside. VS taken. Writer and charge nurse assisted patient back to bed. Orders received. EKG performed and given to MD. Will continue to monitor.  Sim Boast, RN 06/30/13 1130

## 2013-06-30 NOTE — Progress Notes (Signed)
Magnesium of 0.7 reported from LAB per Loraine Maple. MD notified.  Sim Boast, RN 06/30/13 1313

## 2013-06-30 NOTE — Progress Notes (Signed)
NEURO HOSPITALIST PROGRESS NOTE   SUBJECTIVE:                                                                                                                        Patient has no complaints. In talking to her husband she has been having difficulty with orthostatic BP for over one year now.  Last year they went to see a cardiologist but he cannot remember exactly what was said.  She seemed to be doing well for a while but again recently she has had a few episodes of syncopal episodes while getting up to urinate.    OBJECTIVE:                                                                                                                           Vital signs in last 24 hours: Temp:  [97.7 F (36.5 C)-99.4 F (37.4 C)] 98.1 F (36.7 C) (08/28 1007) Pulse Rate:  [97-125] 102 (08/28 1120) Resp:  [16-20] 20 (08/28 1007) BP: (98-136)/(49-82) 98/62 mmHg (08/28 1120) SpO2:  [97 %-100 %] 98 % (08/28 1120)  Intake/Output from previous day: 08/27 0701 - 08/28 0700 In: 330 [P.O.:330] Out: -  Intake/Output this shift: Total I/O In: 9762.9 [P.O.:240; I.V.:8322.9; IV Piggyback:1200] Out: -  Nutritional status: Carb Control  Past Medical History  Diagnosis Date  . Diabetes mellitus   . GERD (gastroesophageal reflux disease)   . Seizures   . Cataract   . Anxiety   . Stroke      Neurologic Exam:  Mental Status: Alert, oriented, thought content appropriate.  Speech fluent without evidence of aphasia.  Able to follow 3 step commands without difficulty. Cranial Nerves: II: Discs flat bilaterally; Visual fields grossly normal, pupils equal, round, reactive to light and accommodation III,IV, VI: ptosis not present, extra-ocular motions intact bilaterally V,VII: smile symmetric, facial light touch sensation normal bilaterally VIII: hearing normal bilaterally IX,X: gag reflex present XI: bilateral shoulder shrug XII: midline tongue  extension Motor: Right : Upper extremity   5/5    Left:     Upper extremity   5/5  Lower extremity   5/5     Lower extremity   5/5 Tone and bulk:normal tone throughout; no atrophy noted  Sensory: Pinprick and light touch intact throughout, bilaterally Deep Tendon Reflexes:  Right: Upper Extremity   Left: Upper extremity   biceps (C-5 to C-6) 2/4   biceps (C-5 to C-6) 2/4 tricep (C7) 2/4    triceps (C7) 2/4 Brachioradialis (C6) 2/4  Brachioradialis (C6) 2/4  Lower Extremity Lower Extremity  quadriceps (L-2 to L-4) 2/4   quadriceps (L-2 to L-4) 2/4 Achilles (S1) 1/4   Achilles (S1) 1/4  Plantars: Right: downgoing   Left: downgoing Cerebellar: normal finger-to-nose,  normal heel-to-shin test CV: pulses palpable throughout    Lab Results: Lab Results  Component Value Date/Time   CHOL 129 06/29/2013  2:19 PM   Lipid Panel  Recent Labs  06/29/13 1419  CHOL 129  TRIG 82  HDL 86  CHOLHDL 1.5  VLDL 16  LDLCALC 27    Studies/Results: No results found.  MEDICATIONS                                                                                                                        Scheduled: . fluconazole  100 mg Oral Q24H  . insulin aspart  0-15 Units Subcutaneous Q4H  . insulin detemir  8 Units Subcutaneous QHS  . levETIRAcetam  500 mg Oral BID  . levofloxacin (LEVAQUIN) IV  750 mg Intravenous Q48H  . magnesium sulfate 1 - 4 g bolus IVPB  3 g Intravenous Once  . sodium chloride  3 mL Intravenous Q12H    ASSESSMENT/PLAN:                                                                                                             72 yo female who was admitted after a period of witness seizure at home and while in the ED. Since being placed on Keppra she has had no further seizures. Neurology was called back to see patient due to orthostatic syncopal episodes during micturition. The question posed to neurology was if Keppra could be causing hypotension leading to  orthostatic BP.  Given fact patient has had this issue one year ago prior to initiation of Keppra and SE profile of Keppra does not include hypotension, less likely Keppra induced.   Would recommend cardiology consult, orthostatic BP (patient has documented lying systolic 129 then sitting systolic BP 98), PT/OT, out patient neurology follow up for seizures at discharge.   No furthur neurology recommendations at this time.  Neurology will S/O  Assessment and plan discussed with with attending physician and they are in agreement.    Onalee Hua  Ula Lingo [facility] [phone]  06/30/2013, 1:47 PM

## 2013-06-30 NOTE — Progress Notes (Signed)
PT Cancellation Note  Patient Details Name: [name] [name] MRN: [id number] DOB: [birth year]   Cancelled Treatment:    Reason Eval/Treat Not Completed: Medical issues which prohibited therapy.  RN requesting to hold therapy at this time due to pt with seizure activity at this time    Verdell Face, [place] [phone] 06/30/2013

## 2013-06-30 NOTE — Progress Notes (Signed)
Critical potassium reported of 2.3 per Curt Bears. Result reported to MD. Dr. Joseph Art aware and orders received. Will continue to monitor.   Sim Boast, RN [phone] 06/30/13

## 2013-06-30 NOTE — Progress Notes (Signed)
[facility] PROGRESS NOTE  [name] [name] WUJ:[id number] DOB: [birth year] DOA: 06/25/2013 PCP: Aida Puffer, MD  Brief narrative:  72 y.o. female with a hx of previous intracranial hemorrhage, seizures, HTN  and diabetes who presented to the ED with recurrent seizures. She presented to the ED on 8/20 with complaints of a headache lasting several days. This was associated with a left visual field cut. Neurology was consulted, and per recs, the patient underwent an MRI of the brain which was unremarkable. The patient was discharged home from the ED and treated symptomatically for her headache. On the morning of admission, ( 8/23) , the patient had a witnessed seizure. EMS was called. Upon EMS arrival, the patient was alert/oriented and the family refused transport. The pt then had another seizure and was transported to the ED. In the ED, the pt had another witnessed seizure lasting about 45sec. A CT head was done, which redemonstrated the known remote hmorrhagic infarct, without acute changes. Neurology was consulted and admitted to step down unit. 06/29/2013 patient sleepy, but arousable answers questions appropriately. Husband at bedside stated that a week prior to hospitalization episodes of passing out in the bathroom, and would be confused when she regained consciousness (sounds postictal). States prior to hospitalization last generalized seizure 10 months ago in October 2013. States yesterday again had an episode of passing out while in the bathroom (was not having BM) husband unsure whether she was more confused after she regained consciousness. Patient states currently no headache TODAY husband and nurse report that patient has had several incidences of what appears to be LOC while using the restroom (each time has been urination). At the time of LOC patient was hypotensive, today's episode patient was BP=98/62, HR= 102. After approximately 5 minutes patient came out of episode, negative postictal signs  or symptoms.      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 Continue Keppra 500 mg by mouth twice a day discharge home. NOTE /OT both recommend CIR vs SNF.   -- ReConsult for inpatient neuro rehabilitation placed. Believe Pt would do better w/ a course of Inpatient Rehab   Fever/leukocytosis Present on admission. Concern for aspiration pneumonia. The levofloxacin day 5/7. Continued high WBC. Aspiration pneumonia vs Demargination from seizures   DM type 2  CBGs are high, restart Levemir 8 units each bedtime, SSI to moderate scale. Obtain hemoglobin A1c. Obtain lipid panel  HTN  DC IV metoprolol and start labetalol 100 mg twice a day  CKD stage 3, GFR 30-59 ml/min  Renal fn stable  Diffuse T wave changes  Noted on EKG in ED. 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 Recommend regular diet   Thrush  IV Diflucan   Anxiety/agitation; when necessary Ativan   Autonomic dysfunction ---Patient appears to become hypotensive when using the restroom urination and     BM. Will D/C all BP medication ---Obtain STAT EKG --- Obtain stat electrolytes --- Will reconsult neurology; spoke with Felicie Morn PA-C [facility]            Neurohospitalist   --- Consult cardiology; Consulted [facility]    Code Status: FULL  Family Communication: Spoke with husband concerning plan of care Done at bedside Disposition Plan: Physical therapy decrements CIR. Seen by CIR and recommend  home health  Consultants:  Neurology   Procedures:  8/24 - LP per Neuro  CSF culture negative CSF Gram stain Negative CSF cell count negative CSF glucose high  Procedure CT head without contrast 06/22/2013 IMPRESSION:  1. No acute intracranial findings.  2. Known remote hemorrhagic infarction in the right posteromedial  temporal lobe.  3. Periventricular white matter and corona radiata hypodensities  favor chronic ischemic  microvascular white matter disease.  MRI brain without contrast 06/22/2013 IMPRESSION:  No acute infarction. Moderate chronic small vessel changes of the  cerebral hemispheric white matter. Old hemorrhagic infarction in  the right posteromedial temporal lobe that could effect the optic  radiations on the right.    Antibiotics:  Ampicillin 8/23 >> 8/24  Rocephin 8/23 >> 8/24  Vancomycin 8/23 >> 8/24  Levaquin 8/23, day 5/7   DVT prophylaxis:  SCDs      Filed Vitals:   06/30/13 0506  BP: 126/82  Pulse: 100  Temp: 97.7 F (36.5 C)  Resp: 20    Intake/Output Summary (Last 24 hours) at 06/30/13 1308 Last data filed at 06/29/13 1300  Gross per 24 hour  Intake    120 ml  Output      0 ml  Net    120 ml   Filed Weights   06/25/13 1315 06/26/13 0438  Weight: 45.8 kg (100 lb 15.5 oz) 50.1 kg (110 lb 7.2 oz)    Exam:   General: Alert,NAD until patient had LOC while urinating   Cardiovascular; regular rhythm tachycardic, negative murmurs rubs gallops, DP/PT pulse 1+ bilateral  Chest: Clear to auscultation bilaterally  Abdomen: Soft, nontender, nondistended, bowel sounds present  Extremities: Warm, no edema  CNS: Alert and oriented x4 (following LOC), cranial nerves II through XII intact    Data Reviewed: Basic Metabolic Panel:  Recent Labs Lab 06/25/13 0741 06/26/13 0750 06/27/13 0530 06/28/13 0508 06/30/13 0705  NA 138 141 143 139 133*  K 3.8 3.7 3.8 3.5 2.3*  CL 97 105 106 104 95*  CO2 19 22 17* 18* 27  GLUCOSE 284* 137* 158* 220* 112*  BUN 31* 23 34* 24* 13  CREATININE 1.30* 1.03 1.03 0.96 0.89  CALCIUM 8.8 8.2* 7.9* 7.8* 7.2*   Liver Function Tests:  Recent Labs Lab 06/25/13 0741 06/26/13 0750 06/27/13 0530 06/30/13 0705  AST 28 28 31  34  ALT 19 16 18 19   ALKPHOS 92 68 64 66  BILITOT 0.7 0.4 0.3 0.5  PROT 7.4 6.7 6.4 5.5*  ALBUMIN 3.6 3.1* 2.9* 2.5*   No results found for this basename: LIPASE, AMYLASE,  in the last 168 hours No  results found for this basename: AMMONIA,  in the last 168 hours CBC:  Recent Labs Lab 06/25/13 0741 06/26/13 0545 06/27/13 0530 06/27/13 1130 06/29/13 0540 06/30/13 0705  WBC 12.2* 11.2* 13.7* 13.3* 14.0* 15.6*  NEUTROABS 7.8*  --   --   --   --  11.7*  HGB 12.4 11.2* 11.5* 11.1* 11.8* 10.2*  HCT 36.9 33.7* 35.0* 32.6* 35.2* 29.9*  MCV 96.6 96.6 97.8 95.9 94.9 93.4  PLT 243 203 203 222 221 176   Cardiac Enzymes:  Recent Labs Lab 06/27/13 0530 06/27/13 1130 06/28/13 0508  CKTOTAL 347*  --   --   TROPONINI  --  <0.30 <0.30   BNP (last 3 results) No results found for this basename: PROBNP,  in the last 8760 hours CBG:  Recent Labs Lab 06/29/13 0631 06/29/13 1128 06/29/13 1607 06/29/13 2128 06/30/13 0036  GLUCAP 271* 201* 153* 197* 283*    Recent Results (from the past 240 hour(s))  MRSA PCR SCREENING     Status: None  Collection Time    06/25/13  1:25 PM      Result Value Range Status   MRSA by PCR NEGATIVE  NEGATIVE Final   Comment:            The GeneXpert MRSA Assay (FDA     approved for NASAL specimens     only), is one component of a     comprehensive MRSA colonization     surveillance program. It is not     intended to diagnose MRSA     infection nor to guide or     monitor treatment for     MRSA infections.  CULTURE, BLOOD (ROUTINE X 2)     Status: None   Collection Time    06/25/13  4:15 PM      Result Value Range Status   Specimen Description BLOOD RIGHT ARM   Final   Special Requests     Final   Value: BOTTLES DRAWN AEROBIC AND ANAEROBIC 10CC BLUE,5CC RED   Culture  Setup Time     Final   Value: 06/25/2013 22:13     Performed at [facility]   Culture     Final   Value:        BLOOD CULTURE RECEIVED NO GROWTH TO DATE CULTURE WILL BE HELD FOR 5 DAYS BEFORE ISSUING A FINAL NEGATIVE REPORT     Performed at [facility]   Report Status PENDING   Incomplete  CULTURE, BLOOD (ROUTINE X 2)     Status: None   Collection Time     06/25/13  4:30 PM      Result Value Range Status   Specimen Description BLOOD RIGHT HAND   Final   Special Requests BOTTLES DRAWN AEROBIC ONLY 10CC   Final   Culture  Setup Time     Final   Value: 06/25/2013 22:13     Performed at [facility]   Culture     Final   Value:        BLOOD CULTURE RECEIVED NO GROWTH TO DATE CULTURE WILL BE HELD FOR 5 DAYS BEFORE ISSUING A FINAL NEGATIVE REPORT     Performed at [facility]   Report Status PENDING   Incomplete  GRAM STAIN     Status: None   Collection Time    06/25/13  7:58 PM      Result Value Range Status   Specimen Description CSF   Final   Special Requests TUBE 2   Final   Gram Stain     Final   Value: CYTOSPIN SLIDE     WBC PRESENT, PREDOMINANTLY MONONUCLEAR     NO ORGANISMS SEEN   Report Status 06/25/2013 FINAL   Final  CSF CULTURE     Status: None   Collection Time    06/25/13  7:58 PM      Result Value Range Status   Specimen Description CSF   Final   Special Requests TUBE 2   Final   Gram Stain     Final   Value: WBC PRESENT, PREDOMINANTLY MONONUCLEAR     NO ORGANISMS SEEN     CYTOSPIN Performed at [facility]     Performed at [facility]   Culture     Final   Value: NO GROWTH 3 DAYS     Performed at [facility]   Report Status 06/29/2013 FINAL   Final     Studies: No results found.  Scheduled Meds: . fluconazole  100 mg Oral Q24H  . insulin aspart  0-15 Units Subcutaneous Q4H  . insulin detemir  8 Units Subcutaneous QHS  . labetalol  100 mg Oral BID  . levETIRAcetam  500 mg Oral BID  . levofloxacin (LEVAQUIN) IV  750 mg Intravenous Q48H  . potassium chloride  10 mEq Intravenous Q1 Hr x 3  . sodium chloride  3 mL Intravenous Q12H   Continuous Infusions: . sodium chloride 1,000 mL (06/29/13 2327)      Time spent: 30 minutes    Fay Bagg, Roselind Messier  [facility] Pager [phone] If 7PM-7AM, please contact night-coverage at [email or web address], password  [facility] 06/30/2013, 8:23 AM  LOS: 5 days

## 2013-07-01 ENCOUNTER — Inpatient Hospital Stay (HOSPITAL_COMMUNITY): Payer: Medicare Other

## 2013-07-01 DIAGNOSIS — E876 Hypokalemia: Secondary | ICD-10-CM | POA: Diagnosis present

## 2013-07-01 LAB — CULTURE, BLOOD (ROUTINE X 2)
Culture: NO GROWTH
Culture: NO GROWTH

## 2013-07-01 LAB — CBC WITH DIFFERENTIAL/PLATELET
Eosinophils Relative: 0 % (ref 0–5)
HCT: 31.8 % — ABNORMAL LOW (ref 36.0–46.0)
Hemoglobin: 10.8 g/dL — ABNORMAL LOW (ref 12.0–15.0)
Lymphocytes Relative: 15 % (ref 12–46)
MCHC: 34 g/dL (ref 30.0–36.0)
MCV: 94.4 fL (ref 78.0–100.0)
Monocytes Absolute: 2 10*3/uL — ABNORMAL HIGH (ref 0.1–1.0)
Monocytes Relative: 11 % (ref 3–12)
Neutro Abs: 13.6 10*3/uL — ABNORMAL HIGH (ref 1.7–7.7)
WBC: 18.4 10*3/uL — ABNORMAL HIGH (ref 4.0–10.5)

## 2013-07-01 LAB — NA AND K (SODIUM & POTASSIUM), RAND UR: Sodium, Ur: 91 mEq/L

## 2013-07-01 LAB — COMPREHENSIVE METABOLIC PANEL
ALT: 19 U/L (ref 0–35)
Albumin: 2.6 g/dL — ABNORMAL LOW (ref 3.5–5.2)
Calcium: 7.7 mg/dL — ABNORMAL LOW (ref 8.4–10.5)
GFR calc Af Amer: 66 mL/min — ABNORMAL LOW (ref >90)
Glucose, Bld: 60 mg/dL — ABNORMAL LOW (ref 70–99)
Potassium: 2.4 mEq/L — CL (ref 3.5–5.1)
Sodium: 145 mEq/L (ref 135–145)
Total Protein: 6.1 g/dL (ref 6.0–8.3)

## 2013-07-01 LAB — GLUCOSE, CAPILLARY
Glucose-Capillary: 124 mg/dL — ABNORMAL HIGH (ref 70–99)
Glucose-Capillary: 74 mg/dL (ref 70–99)
Glucose-Capillary: 210 mg/dL — ABNORMAL HIGH (ref 70–99)
Glucose-Capillary: 275 mg/dL — ABNORMAL HIGH (ref 70–99)
Glucose-Capillary: 278 mg/dL — ABNORMAL HIGH (ref 70–99)

## 2013-07-01 LAB — POTASSIUM: Potassium: 2.9 mEq/L — ABNORMAL LOW (ref 3.5–5.1)

## 2013-07-01 LAB — PHOSPHORUS: Phosphorus: 0.8 mg/dL — CL (ref 2.3–4.6)

## 2013-07-01 LAB — MAGNESIUM: Magnesium: 2.4 mg/dL (ref 1.5–2.5)

## 2013-07-01 MED ORDER — POTASSIUM CHLORIDE 10 MEQ/100ML IV SOLN
10.0000 meq | INTRAVENOUS | Status: AC
  Administered 2013-07-01 (×4): 10 meq via INTRAVENOUS
  Filled 2013-07-01 (×4): qty 100

## 2013-07-01 MED ORDER — K PHOS MONO-SOD PHOS DI & MONO 155-852-130 MG PO TABS
500.0000 mg | ORAL_TABLET | Freq: Four times a day (QID) | ORAL | Status: DC
  Administered 2013-07-01 – 2013-07-08 (×28): 500 mg via ORAL
  Filled 2013-07-01 (×30): qty 2

## 2013-07-01 MED ORDER — MAGNESIUM SULFATE 40 MG/ML IJ SOLN
2.0000 g | Freq: Once | INTRAMUSCULAR | Status: AC
  Administered 2013-07-01: 2 g via INTRAVENOUS
  Filled 2013-07-01: qty 50

## 2013-07-01 NOTE — Progress Notes (Signed)
Pt not medically ready for CIR.  Will f/up next week to see if CIR becomes a viable option for her. [phone]

## 2013-07-01 NOTE — Progress Notes (Signed)
Follow Up Nutrition Note:   DOCUMENTATION CODES Per approved criteria  -Severe malnutrition in the context of chronic illness -Underweight   INTERVENTION:  Continue Magic Cups TID to maximize oral intake.  Recommend also monitor Phosphorous as low Potassium and Magnesium could be related to refeeding syndrome.   NUTRITION DIAGNOSIS: Malnutrition related to inadequate oral intake as evidenced by severe muscle loss and intake </= 75% of estimated energy intake for >/= 1 month. Ongoing   Goal: Intake to meet >90% of estimated nutrition needs. Improving   Monitor:  Diet advancement, PO intake, labs, weight trend.  ASSESSMENT: Patient was admitted on 8/23 with recurrent seizures. Patient was in the ED on 8/20 with a headache lasting several days; MRI of the brain was unremarkable at that time. CT of head on this admission showed known remote hemorrhagic infarct, without acute changes.Keppra was increased to 500 mg BID. S/P lumbar puncture 8/23; CSF was unremarkable with no signs of acute meningitis. Aspiration PNA suspected.   Pt sleeping at time of RD visit. Eating a Carb Mod Medium diet, fair intake per husband. Assisted with ordering lunch and encouraged intake of Magic Cups for increased protein intake. Pt with low Potassium and Magnesium, being repleted. Given severe malnutrition and poor oral intake on admission could be a component of refeeding syndrome?     Height: Ht Readings from Last 1 Encounters:  06/25/13 5\' 5"  (1.651 m)    Weight: Wt Readings from Last 1 Encounters:  06/26/13 110 lb 7.2 oz (50.1 kg)  06/25/13 100 lb (ADMISSION WEIGHT)   BMI:  Body mass index is 18.38 kg/(m^2). Underweight  Estimated Nutritional Needs: Kcal: 1400-1600 Protein: 70-80 gm Fluid: 1.4-1.6 L  Skin: no problems  Diet Order: Carb Control    Intake/Output Summary (Last 24 hours) at 07/01/13 1104 Last data filed at 06/30/13 1900  Gross per 24 hour  Intake 2275.42 ml  Output       0 ml  Net 2275.42 ml    Last BM: 8/27  Labs:   Recent Labs Lab 06/30/13 0705 06/30/13 1205 07/01/13 0500 07/01/13 0750  NA 133* 133* 145  --   K 2.3* 3.1* 2.4* 2.3*  CL 95* 96 105  --   CO2 27 29 28   --   BUN 13 12 8   --   CREATININE 0.89 1.15* 0.97  --   CALCIUM 7.2* 7.2* 7.7*  --   MG  --  0.7*  --  1.3*  GLUCOSE 112* 240* 60*  --     CBG (last 3)   Recent Labs  07/01/13 0014 07/01/13 0514 07/01/13 0604  GLUCAP 208* 59* 124*    Scheduled Meds: . fluconazole  100 mg Oral Q24H  . insulin aspart  0-15 Units Subcutaneous Q4H  . insulin detemir  8 Units Subcutaneous QHS  . levETIRAcetam  500 mg Oral BID  . levofloxacin (LEVAQUIN) IV  750 mg Intravenous Q48H  . magnesium sulfate 1 - 4 g bolus IVPB  2 g Intravenous Once  . potassium chloride  10 mEq Intravenous Q1 Hr x 4  . sodium chloride  3 mL Intravenous Q12H    Continuous Infusions: . sodium chloride 125 mL/hr at 06/30/13 1307    Clarene Duke RD, LDN Pager [phone] After Hours pager [phone]

## 2013-07-01 NOTE — Progress Notes (Signed)
Patient Name: [name] [name]      SUBJECTIVE: recurrent syncope apparently with doc no arrhythmia in context of micturition and profound HYPOKALEMIA  Responds to voice    Past Medical History  Diagnosis Date  . Diabetes mellitus   . GERD (gastroesophageal reflux disease)   . Seizures   . Cataract   . Anxiety   . Stroke 2013    Secondary to acute right posterior temporo-occipital intra-axial hemorrhage    Scheduled Meds:  Scheduled Meds: . fluconazole  100 mg Oral Q24H  . insulin aspart  0-15 Units Subcutaneous Q4H  . insulin detemir  8 Units Subcutaneous QHS  . levETIRAcetam  500 mg Oral BID  . levofloxacin (LEVAQUIN) IV  750 mg Intravenous Q48H  . potassium chloride  10 mEq Intravenous Q1 Hr x 4  . sodium chloride  3 mL Intravenous Q12H   Continuous Infusions: . sodium chloride 125 mL/hr at 06/30/13 1307    PHYSICAL EXAM Filed Vitals:   06/30/13 1808 06/30/13 2151 07/01/13 0200 07/01/13 0511  BP: 145/68 162/69 133/56 172/75  Pulse: 122 120 85 122  Temp: 98.1 F (36.7 C) 98.2 F (36.8 C) 100.1 F (37.8 C) 98.2 F (36.8 C)  TempSrc: Oral Oral Oral Oral  Resp: 20 20 20 20   Height:      Weight:      SpO2: 100% 98% 100% 96%   Alert and responds to voice  Does not turn head to the left Clear laterally RRR but rapid with early systolic murmur Soft No edema  TELEMETRY: Reviewed telemetry pt in sinus and? aterial tachycardia   Intake/Output Summary (Last 24 hours) at 07/01/13 0849 Last data filed at 06/30/13 1900  Gross per 24 hour  Intake 2612.92 ml  Output      0 ml  Net 2612.92 ml    LABS: Basic Metabolic Panel:  Recent Labs Lab 06/25/13 0741 06/26/13 0750 06/27/13 0530 06/28/13 0508 06/30/13 0705 06/30/13 1205 07/01/13 0500 07/01/13 0750  NA 138 141 143 139 133* 133* 145  --   K 3.8 3.7 3.8 3.5 2.3* 3.1* 2.4* 2.3*  CL 97 105 106 104 95* 96 105  --   CO2 19 22 17* 18* 27 29 28   --   GLUCOSE 284* 137* 158* 220* 112* 240* 60*  --     BUN 31* 23 34* 24* 13 12 8   --   CREATININE 1.30* 1.03 1.03 0.96 0.89 1.15* 0.97  --   CALCIUM 8.8 8.2* 7.9* 7.8* 7.2* 7.2* 7.7*  --   MG  --   --   --   --   --  0.7*  --  1.3*   Cardiac Enzymes: No results found for this basename: CKTOTAL, CKMB, CKMBINDEX, TROPONINI,  in the last 72 hours CBC:  Recent Labs Lab 06/25/13 0741 06/26/13 0545 06/27/13 0530 06/27/13 1130 06/29/13 0540 06/30/13 0705 07/01/13 0500  WBC 12.2* 11.2* 13.7* 13.3* 14.0* 15.6* 18.4*  NEUTROABS 7.8*  --   --   --   --  11.7* 13.6*  HGB 12.4 11.2* 11.5* 11.1* 11.8* 10.2* 10.8*  HCT 36.9 33.7* 35.0* 32.6* 35.2* 29.9* 31.8*  MCV 96.6 96.6 97.8 95.9 94.9 93.4 94.4  PLT 243 203 203 222 221 176 239   PROTIME: No results found for this basename: LABPROT, INR,  in the last 72 hours Liver Function Tests:  Recent Labs  06/30/13 1205 07/01/13 0500  AST 34 32  ALT 19 19  ALKPHOS 74 76  BILITOT 0.5 0.5  PROT 5.5* 6.1  ALBUMIN 2.4* 2.6*   No results found for this basename: LIPASE, AMYLASE,  in the last 72 hours BNP: BNP (last 3 results) No results found for this basename: PROBNP,  in the last 8760 hours D-Dimer: No results found for this basename: DDIMER,  in the last 72 hours Hemoglobin A1C:  Recent Labs  06/29/13 1330  HGBA1C 8.6*   Fasting Lipid Panel:  Recent Labs  06/29/13 1419  CHOL 129  HDL 86  LDLCALC 27  TRIG 82  CHOLHDL 1.5   Thyroid Function Tests:  Recent Labs  06/30/13 1205  TSH 0.586     ASSESSMENT AND PLAN:  Principal Problem:   Seizure Active Problems:   DM type 2 (diabetes mellitus, type 2)   HTN (hypertension)   CKD (chronic kidney disease) stage 3, GFR 30-59 ml/min   Hemorrhage in the brain-13 x 22 x 14 mm right posterior temporo-occipital intra-axial acute   Syncope   Protein-calorie malnutrition, severe   Leukocytosis, unspecified   Hypokalemia   Hypomagnesemia  Needs K repletion --aggressively and not sure why she is so hypokalemic as was not on  diuretic as an outpatient ECG reviewed from the last year based on pwave morphologies, this may be  atrial tach  And not just sinus  Tachycardia recorded much over the last year. iits hard totell but there is a positive v negative P wave in V1 albeit with relatively similar HR Control will be a challenge with the issues of BP, esp if orthostatic Orthostatic VS and consider the use of mestinon if abnormal  Will follow   Signed, Sherryl Manges MD  07/01/2013

## 2013-07-01 NOTE — Progress Notes (Signed)
PT Cancellation Note  Patient Details Name: [name] [name] MRN: [id number] DOB: 1940/11/27   Cancelled Treatment:    Reason Eval/Treat Not Completed: Medical issues which prohibited therapy.  Patient with K+ of 2.3.  Will return at later date for PT session.   [name] 07/01/2013, 12:24 PM Durenda Hurt. Renaldo Fiddler, [facility] Pager [phone]

## 2013-07-01 NOTE — Progress Notes (Signed)
Hypoglycemic Event  CBG: 59  Treatment: 15 GM carbohydrate snack  Symptoms: Hungry and None  Follow-up CBG: [id number] CBG Result:124  Possible Reasons for Event:Unknown Comments/MD notified:NA    Chayanne Speir, Constance Holster  Remember to initiate Hypoglycemia Order Set & complete

## 2013-07-01 NOTE — Progress Notes (Signed)
OT Cancellation Note  Patient Details Name: [name] [name] MRN: [id number] DOB: 02-25-1941   Cancelled Treatment:    Reason Eval/Treat Not Completed: Medical issues which prohibited therapy - Pt with K+ 2.3.  [name] [phone] 07/01/2013, 10:10 AM

## 2013-07-01 NOTE — Clinical Social Work Placement (Signed)
Clinical Social Work Department CLINICAL SOCIAL WORK PLACEMENT NOTE 07/01/2013  Patient:  [name] [name], [name] [name]  Account Number:  [id number] Admit date:  06/25/2013  Clinical Social Worker:  Irving Burton [place], LCSWA  Date/time:  06/29/2013 07:08 PM  Clinical Social Work is seeking post-discharge placement for this patient at the following level of care:   SKILLED NURSING   (*CSW will update this form in Epic as items are completed)   06/29/2013  Patient/family provided with [facility] Department of Clinical Social Work's list of facilities offering this level of care within the geographic area requested by the patient (or if unable, by the patient's family).  06/29/2013  Patient/family informed of their freedom to choose among providers that offer the needed level of care, that participate in Medicare, Medicaid or managed care program needed by the patient, have an available bed and are willing to accept the patient.  06/29/2013  Patient/family informed of [facility]' ownership interest in [facility], as well as of the fact that they are under no obligation to receive care at this facility.  PASARR submitted to EDS on 07/01/2013 PASARR number received from EDS on 07/01/2013  FL2 transmitted to all facilities in geographic area requested by pt/family on  07/01/2013 FL2 transmitted to all facilities within larger geographic area on   Patient informed that his/her managed care company has contracts with or will negotiate with  certain facilities, including the following:     Patient/family informed of bed offers received:   Patient chooses bed at  Physician recommends and patient chooses bed at    Patient to be transferred to  on   Patient to be transferred to facility by   The following physician request were entered in Epic:   Additional Comments: 08/29 - per patient husband, patient only wants to go home

## 2013-07-01 NOTE — Progress Notes (Signed)
[facility] PROGRESS NOTE  Jessenya Berdan AVW:[id number] DOB: 01/09/1941 DOA: 06/25/2013 PCP: Aida Puffer, MD  Brief narrative:  72 y.o. female with a hx of previous intracranial hemorrhage, seizures, HTN  and diabetes who presented to the ED with recurrent seizures. She presented to the ED on 8/20 with complaints of a headache lasting several days. This was associated with a left visual field cut. Neurology was consulted, and per recs, the patient underwent an MRI of the brain which was unremarkable. The patient was discharged home from the ED and treated symptomatically for her headache. On the morning of admission, ( 8/23) , the patient had a witnessed seizure. EMS was called. Upon EMS arrival, the patient was alert/oriented and the family refused transport. The pt then had another seizure and was transported to the ED. In the ED, the pt had another witnessed seizure lasting about 45sec. A CT head was done, which redemonstrated the known remote hmorrhagic infarct, without acute changes. Neurology was consulted and admitted to step down unit. 06/29/2013 patient sleepy, but arousable answers questions appropriately. Husband at bedside stated that a week prior to hospitalization episodes of passing out in the bathroom, and would be confused when she regained consciousness (sounds postictal). States prior to hospitalization last generalized seizure 10 months ago in October 2013. States yesterday again had an episode of passing out while in the bathroom (was not having BM) husband unsure whether she was more confused after she regained consciousness. Patient states currently no headache 06/30/2013 husband and nurse report that patient has had several incidences of what appears to be LOC while using the restroom (each time has been urination). At the time of LOC patient was hypotensive, today's episode patient was BP=98/62, HR= 102. After approximately 5 minutes patient came out of episode, negative postictal  signs or symptoms. 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 Continue Keppra 500 mg by mouth twice a day discharge home. NOTE /OT both recommend CIR vs SNF.   -- ReConsult for inpatient neuro rehabilitation placed. Believe Pt would do better w/ a course of Inpatient Rehab   Fever/leukocytosis Present on admission. Concern for aspiration pneumonia. The levofloxacin day 6/7. Continued high WBC. Aspiration pneumonia vs Demargination from seizures --Counseled patient and family continuing increasing WBC without bands although patient feeling better clinically will obtain pan culture, chest x-ray   DM type 2  CBGs are still mildly high however has also had some low readings, therefore continue Levemir 8 units each bedtime, SSI to moderate scale.  --06/29/2013 hemoglobin A1c= 8.6.  --06/29/2013 lipid panel within ADA guideline  HTN  --Patient's BP running high however no further episodes of micturition mediated syncope. Continue to hold all BP medication  CKD stage 3, GFR 30-59 ml/min  Renal fn stable  Diffuse T wave changes  Noted on EKG in ED. 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 Recommend regular diet   Thrush  IV Diflucan   Anxiety/agitation; when necessary Ativan   Syncope (micturition mediated)  EKG, along with consult from cardiology and neurology do not support Autonomic dysfunction. Patient appears to have episodes of micturition mediated hypo-tension.  --- Continue to hold all BP medication --- Ensure placement of a compression stockings   Hypokalemia --  Replete potassium   Hypomagnesemia  --Replete magnesium  Refeeding syndrome? --Obtain potassium, magnesium, phosphorus  levels --Obtain pre-albumin, albumin levels   Code Status: FULL  Family Communication: Spoke with husband concerning plan of care Done at bedside Disposition Plan: Physical therapy decrements CIR. Seen by CIR and recommend  home health  Consultants:  Neurology   Procedures:  8/24 - LP per Neuro  CSF culture negative CSF Gram stain Negative CSF cell count negative CSF glucose high   Procedure CT head without contrast 06/22/2013 IMPRESSION:  1. No acute intracranial findings.  2. Known remote hemorrhagic infarction in the right posteromedial  temporal lobe.  3. Periventricular white matter and corona radiata hypodensities  favor chronic ischemic microvascular white matter disease.  MRI brain without contrast 06/22/2013 IMPRESSION:  No acute infarction. Moderate chronic small vessel changes of the  cerebral hemispheric white matter. Old hemorrhagic infarction in  the right posteromedial temporal lobe that could effect the optic  radiations on the right.    Antibiotics:  Ampicillin 8/23 >> 8/24  Rocephin 8/23 >> 8/24  Vancomycin 8/23 >> 8/24  Levaquin 8/23, day 5/7   DVT prophylaxis:  SCDs      Filed Vitals:   07/01/13 0511  BP: 172/75  Pulse: 122  Temp: 98.2 F (36.8 C)  Resp: 20    Intake/Output Summary (Last 24 hours) at 07/01/13 0713 Last data filed at 06/30/13 1900  Gross per 24 hour  Intake 10698.34 ml  Output      0 ml  Net 10698.34 ml   Filed Weights   06/25/13 1315 06/26/13 0438  Weight: 45.8 kg (100 lb 15.5 oz) 50.1 kg (110 lb 7.2 oz)    Exam:   General: Alert,NAD until patient had LOC while urinating   Cardiovascular; regular rhythm tachycardic, negative murmurs rubs gallops, DP/PT pulse 1+ bilateral  Chest: Clear to auscultation bilaterally  Abdomen: Soft, nontender, nondistended, bowel sounds present  Extremities: Warm, no edema  CNS: Alert and oriented x4 (following LOC), cranial nerves II through XII intact    Data  Reviewed: Basic Metabolic Panel:  Recent Labs Lab 06/27/13 0530 06/28/13 0508 06/30/13 0705 06/30/13 1205 07/01/13 0500  NA 143 139 133* 133* 145  K 3.8 3.5 2.3* 3.1* 2.4*  CL 106 104 95* 96 105  CO2 17* 18* 27 29 28   GLUCOSE 158* 220* 112* 240* 60*  BUN 34* 24* 13 12 8   CREATININE 1.03 0.96 0.89 1.15* 0.97  CALCIUM 7.9* 7.8* 7.2* 7.2* 7.7*  MG  --   --   --  0.7*  --    Liver Function Tests:  Recent Labs Lab 06/26/13 0750 06/27/13 0530 06/30/13 0705 06/30/13 1205 07/01/13 0500  AST 28 31 34 34 32  ALT 16 18 19 19 19   ALKPHOS 68 64 66 74 76  BILITOT 0.4 0.3 0.5 0.5 0.5  PROT 6.7 6.4 5.5* 5.5* 6.1  ALBUMIN 3.1* 2.9* 2.5* 2.4* 2.6*   No results found for this basename: LIPASE, AMYLASE,  in the last 168 hours No results found for this basename: AMMONIA,  in the last 168 hours CBC:  Recent Labs Lab 06/25/13 0741  06/27/13 0530 06/27/13 1130 06/29/13 0540 06/30/13 0705 07/01/13 0500  WBC 12.2*  < > 13.7* 13.3* 14.0* 15.6* 18.4*  NEUTROABS 7.8*  --   --   --   --  11.7* 13.6*  HGB 12.4  < > 11.5* 11.1* 11.8* 10.2* 10.8*  HCT 36.9  < > 35.0* 32.6* 35.2* 29.9*  31.8*  MCV 96.6  < > 97.8 95.9 94.9 93.4 94.4  PLT 243  < > 203 222 221 176 239  < > = values in this interval not displayed. Cardiac Enzymes:  Recent Labs Lab 06/27/13 0530 06/27/13 1130 06/28/13 0508  CKTOTAL 347*  --   --   TROPONINI  --  <0.30 <0.30   BNP (last 3 results) No results found for this basename: PROBNP,  in the last 8760 hours CBG:  Recent Labs Lab 06/30/13 1600 06/30/13 2008 07/01/13 0014 07/01/13 0514 07/01/13 0604  GLUCAP 239* 124* 208* 59* 124*    Recent Results (from the past 240 hour(s))  MRSA PCR SCREENING     Status: None   Collection Time    06/25/13  1:25 PM      Result Value Range Status   MRSA by PCR NEGATIVE  NEGATIVE Final   Comment:            The GeneXpert MRSA Assay (FDA     approved for NASAL specimens     only), is one component of a      comprehensive MRSA colonization     surveillance program. It is not     intended to diagnose MRSA     infection nor to guide or     monitor treatment for     MRSA infections.  CULTURE, BLOOD (ROUTINE X 2)     Status: None   Collection Time    06/25/13  4:15 PM      Result Value Range Status   Specimen Description BLOOD RIGHT ARM   Final   Special Requests     Final   Value: BOTTLES DRAWN AEROBIC AND ANAEROBIC 10CC BLUE,5CC RED   Culture  Setup Time     Final   Value: 06/25/2013 22:13     Performed at [facility]   Culture     Final   Value:        BLOOD CULTURE RECEIVED NO GROWTH TO DATE CULTURE WILL BE HELD FOR 5 DAYS BEFORE ISSUING A FINAL NEGATIVE REPORT     Performed at [facility]   Report Status PENDING   Incomplete  CULTURE, BLOOD (ROUTINE X 2)     Status: None   Collection Time    06/25/13  4:30 PM      Result Value Range Status   Specimen Description BLOOD RIGHT HAND   Final   Special Requests BOTTLES DRAWN AEROBIC ONLY 10CC   Final   Culture  Setup Time     Final   Value: 06/25/2013 22:13     Performed at [facility]   Culture     Final   Value:        BLOOD CULTURE RECEIVED NO GROWTH TO DATE CULTURE WILL BE HELD FOR 5 DAYS BEFORE ISSUING A FINAL NEGATIVE REPORT     Performed at [facility]   Report Status PENDING   Incomplete  GRAM STAIN     Status: None   Collection Time    06/25/13  7:58 PM      Result Value Range Status   Specimen Description CSF   Final   Special Requests TUBE 2   Final   Gram Stain     Final   Value: CYTOSPIN SLIDE     WBC PRESENT, PREDOMINANTLY MONONUCLEAR     NO ORGANISMS SEEN   Report Status 06/25/2013 FINAL   Final  CSF CULTURE  Status: None   Collection Time    06/25/13  7:58 PM      Result Value Range Status   Specimen Description CSF   Final   Special Requests TUBE 2   Final   Gram Stain     Final   Value: WBC PRESENT, PREDOMINANTLY MONONUCLEAR     NO ORGANISMS SEEN     CYTOSPIN  Performed at [facility]     Performed at [facility]   Culture     Final   Value: NO GROWTH 3 DAYS     Performed at [facility]   Report Status 06/29/2013 FINAL   Final     Studies: No results found.  Scheduled Meds: . fluconazole  100 mg Oral Q24H  . insulin aspart  0-15 Units Subcutaneous Q4H  . insulin detemir  8 Units Subcutaneous QHS  . levETIRAcetam  500 mg Oral BID  . levofloxacin (LEVAQUIN) IV  750 mg Intravenous Q48H  . sodium chloride  3 mL Intravenous Q12H   Continuous Infusions: . sodium chloride 125 mL/hr at 06/30/13 1307      Time spent: 50 minutes    WOODS, Roselind Messier  [facility] Pager [phone] If 7PM-7AM, please contact night-coverage at [email or web address], password [facility] 07/01/2013, 7:13 AM  LOS: 6 days

## 2013-07-01 NOTE — Progress Notes (Signed)
CRITICAL VALUE ALERT  Critical value received:  K+ 2.4  Date of notification:  07/01/13  Time of notification:  0700  Critical value read back:yes  Nurse who received alert:  Lamont Snowball  MD notified (1st page):  Woods  Time of first page:  0705  MD notified (2nd page):  Time of second page:  Responding MD:  Joseph Art  Time MD responded:  [phone]

## 2013-07-01 NOTE — Progress Notes (Signed)
Inpatient Diabetes Program Recommendations  AACE/ADA: New Consensus Statement on Inpatient Glycemic Control (2013)  Target Ranges:  Prepandial:   less than 140 mg/dL      Peak postprandial:   less than 180 mg/dL (1-2 hours)      Critically ill patients:  140 - 180 mg/dL   Reason for Visit: Results for CAELI, LINEHAN (MRN [id number]) as of 07/01/2013 11:50  Ref. Range 06/30/2013 16:00 06/30/2013 20:08 07/01/2013 00:14 07/01/2013 05:14 07/01/2013 06:04  Glucose-Capillary Latest Range: 70-99 mg/dL 086 (H) 578 (H) 469 (H) 59 (L) 124 (H)   Note hypoglycemia this morning likely due to moderate correction q 4 hours?   Consider decreasing correction to sensitive tid with meals and HS and consider adding Novolog meal coverage 3 units tid with meals.

## 2013-07-01 NOTE — Clinical Social Work Note (Signed)
Clinical Social Work Department BRIEF PSYCHOSOCIAL ASSESSMENT 07/01/2013  Patient:  [name] [name], [name] [name]     Account Number:  [id number]     Admit date:  06/25/2013  Clinical Social Worker:  Sherre Lain  Date/Time:  06/29/2013 07:05 PM  Referred by:  Physician  Date Referred:  06/29/2013 Referred for  SNF Placement   Other Referral:   none.   Interview type:  Family Other interview type:   CSW spoke to pt's husband.    PSYCHOSOCIAL DATA Living Status:  FAMILY Admitted from facility:   Level of care:   Primary support name:   Primary support relationship to patient:  SPOUSE Degree of support available:   Strong.    CURRENT CONCERNS Current Concerns  Post-Acute Placement   Other Concerns:   none.    SOCIAL WORK ASSESSMENT / PLAN CSW met with pt's husband at bedside. Pt's husband stated that pt was previously living at home with her husband and their daughter. Pt's husband stated that he was agreeable to CSW seeking SNF placement. CSW to continue to follow and assist with discharge planning needs.   Assessment/plan status:  Psychosocial Support/Ongoing Assessment of Needs Other assessment/ plan:   none.   Information/referral to community resources:   [facility] placement.    PATIENT'S/FAMILY'S RESPONSE TO PLAN OF CARE: Pt's husband understanding and agreeable to CSW plan of care.

## 2013-07-02 ENCOUNTER — Inpatient Hospital Stay (HOSPITAL_COMMUNITY): Payer: Medicare Other

## 2013-07-02 DIAGNOSIS — Z452 Encounter for adjustment and management of vascular access device: Secondary | ICD-10-CM

## 2013-07-02 DIAGNOSIS — E43 Unspecified severe protein-calorie malnutrition: Secondary | ICD-10-CM

## 2013-07-02 DIAGNOSIS — E876 Hypokalemia: Secondary | ICD-10-CM

## 2013-07-02 DIAGNOSIS — R000 Tachycardia, unspecified: Secondary | ICD-10-CM

## 2013-07-02 LAB — CBC WITH DIFFERENTIAL/PLATELET
Basophils Relative: 0 % (ref 0–1)
Eosinophils Absolute: 0 10*3/uL (ref 0.0–0.7)
Hemoglobin: 10.6 g/dL — ABNORMAL LOW (ref 12.0–15.0)
MCH: 31.9 pg (ref 26.0–34.0)
MCHC: 33.8 g/dL (ref 30.0–36.0)
Monocytes Relative: 12 % (ref 3–12)
Neutrophils Relative %: 76 % (ref 43–77)
Platelets: 219 10*3/uL (ref 150–400)

## 2013-07-02 LAB — GLUCOSE, CAPILLARY
Glucose-Capillary: 117 mg/dL — ABNORMAL HIGH (ref 70–99)
Glucose-Capillary: 306 mg/dL — ABNORMAL HIGH (ref 70–99)

## 2013-07-02 LAB — URINE CULTURE: Culture: NO GROWTH

## 2013-07-02 LAB — COMPREHENSIVE METABOLIC PANEL
ALT: 18 U/L (ref 0–35)
AST: 29 U/L (ref 0–37)
Alkaline Phosphatase: 91 U/L (ref 39–117)
CO2: 31 mEq/L (ref 19–32)
Chloride: 101 mEq/L (ref 96–112)
GFR calc non Af Amer: 58 mL/min — ABNORMAL LOW (ref >90)
Potassium: 2.9 mEq/L — ABNORMAL LOW (ref 3.5–5.1)
Sodium: 143 mEq/L (ref 135–145)
Total Bilirubin: 0.5 mg/dL (ref 0.3–1.2)

## 2013-07-02 LAB — NA AND K (SODIUM & POTASSIUM), 24 H UR
Potassium Urine: 15 mEq/L
Sodium, Ur: 112 mEq/L

## 2013-07-02 LAB — TSH: TSH: 0.365 u[IU]/mL (ref 0.350–4.500)

## 2013-07-02 MED ORDER — POTASSIUM CHLORIDE 10 MEQ/100ML IV SOLN
10.0000 meq | INTRAVENOUS | Status: AC
  Administered 2013-07-02 (×3): 10 meq via INTRAVENOUS
  Filled 2013-07-02 (×3): qty 100

## 2013-07-02 MED ORDER — POTASSIUM CHLORIDE 10 MEQ/100ML IV SOLN
10.0000 meq | INTRAVENOUS | Status: AC
  Filled 2013-07-02 (×3): qty 100

## 2013-07-02 NOTE — Progress Notes (Signed)
Patient Name: [name] [name]      SUBJECTIVE:   More awake. No recurrent syncope after stopping home meds. Potassium low and being supplemented.     Past Medical History  Diagnosis Date  . Diabetes mellitus   . GERD (gastroesophageal reflux disease)   . Seizures   . Cataract   . Anxiety   . Stroke 2013    Secondary to acute right posterior temporo-occipital intra-axial hemorrhage    Scheduled Meds:  Scheduled Meds: . fluconazole  100 mg Oral Q24H  . insulin aspart  0-15 Units Subcutaneous Q4H  . insulin detemir  8 Units Subcutaneous QHS  . levETIRAcetam  500 mg Oral BID  . levofloxacin (LEVAQUIN) IV  750 mg Intravenous Q48H  . phosphorus  500 mg Oral QID  . sodium chloride  3 mL Intravenous Q12H   Continuous Infusions: . sodium chloride 125 mL/hr at 06/30/13 1307    PHYSICAL EXAM Filed Vitals:   07/02/13 0509 07/02/13 0510 07/02/13 0511 07/02/13 0514  BP: 127/61 131/75 119/55 133/114  Pulse: 101 112 128 123  Temp: 97.4 F (36.3 C)     TempSrc: Oral     Resp: 20     Height:      Weight:      SpO2: 97%      Thin frail woman lying flat in bed in NAD Neck JVP flat Carotids 1+ bialterally Cor IRR IRR tachy  Lungs: clear Ab: soft NT/ND No edema or cyanosis Awake. Moves all 4 without difficulty   TELEMETRY: Appears to be NSR and MAT average rate now close to 120   No intake or output data in the 24 hours ending 07/02/13 1100  LABS: Basic Metabolic Panel:  Recent Labs Lab 06/26/13 0750 06/27/13 0530 06/28/13 0508 06/30/13 0705  06/30/13 1205 07/01/13 0500 07/01/13 0750 07/01/13 1346 07/01/13 2009 07/02/13 0506  NA 141 143 139 133*  --  133* 145  --   --   --  143  K 3.7 3.8 3.5 2.3*  --  3.1* 2.4* 2.3* 4.0 2.9* 2.9*  CL 105 106 104 95*  --  96 105  --   --   --  101  CO2 22 17* 18* 27  --  29 28  --   --   --  31  GLUCOSE 137* 158* 220* 112*  --  240* 60*  --   --   --  104*  BUN 23 34* 24* 13  --  12 8  --   --   --  8  CREATININE 1.03 1.03  0.96 0.89  --  1.15* 0.97  --   --   --  0.96  CALCIUM 8.2* 7.9* 7.8* 7.2*  --  7.2* 7.7*  --   --   --  7.8*  MG  --   --   --   --   < > 0.7*  --  1.3* 2.4 1.7 1.5  PHOS  --   --   --   --   --   --   --   --   --  0.8*  --   < > = values in this interval not displayed. Cardiac Enzymes: No results found for this basename: CKTOTAL, CKMB, CKMBINDEX, TROPONINI,  in the last 72 hours CBC:  Recent Labs Lab 06/26/13 0545 06/27/13 0530 06/27/13 1130 06/29/13 0540 06/30/13 0705 07/01/13 0500 07/02/13 0506  WBC 11.2* 13.7* 13.3* 14.0* 15.6* 18.4* 12.8*  NEUTROABS  --   --   --   --  11.7* 13.6* 9.7*  HGB 11.2* 11.5* 11.1* 11.8* 10.2* 10.8* 10.6*  HCT 33.7* 35.0* 32.6* 35.2* 29.9* 31.8* 31.4*  MCV 96.6 97.8 95.9 94.9 93.4 94.4 94.6  PLT 203 203 222 221 176 239 219   PROTIME: No results found for this basename: LABPROT, INR,  in the last 72 hours Liver Function Tests:  Recent Labs  07/01/13 0500 07/02/13 0506  AST 32 29  ALT 19 18  ALKPHOS 76 91  BILITOT 0.5 0.5  PROT 6.1 6.1  ALBUMIN 2.6* 2.2*   No results found for this basename: LIPASE, AMYLASE,  in the last 72 hours BNP: BNP (last 3 results) No results found for this basename: PROBNP,  in the last 8760 hours D-Dimer: No results found for this basename: DDIMER,  in the last 72 hours Hemoglobin A1C:  Recent Labs  06/29/13 1330  HGBA1C 8.6*   Fasting Lipid Panel:  Recent Labs  06/29/13 1419  CHOL 129  HDL 86  LDLCALC 27  TRIG 82  CHOLHDL 1.5   Thyroid Function Tests:  Recent Labs  07/01/13 0500  TSH 0.315*     ASSESSMENT AND PLAN:  Principal Problem:   Seizure Active Problems:   DM type 2 (diabetes mellitus, type 2)   HTN (hypertension)   CKD (chronic kidney disease) stage 3, GFR 30-59 ml/min   Hemorrhage in the brain-13 x 22 x 14 mm right posterior temporo-occipital intra-axial acute   Syncope   Protein-calorie malnutrition, severe   Leukocytosis, unspecified   Hypokalemia    Hypomagnesemia   No further syncope. K still low and being repleted - can give through central line now as needed. I assisted with central line this am and CVP appears very low. Would give IVFs as tolerated. Tele looks like MAT. Can consider low-dose diltiazem (30 q 6) as tolerated.    Migdalia Dk MD  07/02/2013

## 2013-07-02 NOTE — Progress Notes (Signed)
FOLLOW-UP/CONSULT  DOCUMENTATION CODES Per approved criteria  -Severe malnutrition in the context of chronic illness -Underweight   INTERVENTION:  Temporarily discontinue Magic Cups TID 2/2 high CHO content, will resume when refeeding labs are normal.  Decrease Carbohydrate Modified diet from Medium to Low to help provide appropriate carbohydrate provision during refeeding. Recommend continued monitoring of magnesium, potassium, and phosphorus daily for at least 3 days, MD to replete as needed, as pt is at risk for refeeding syndrome given severe malnutrition. RD to continue to follow nutrition care plan; appreciate coordination of care with MD  NUTRITION DIAGNOSIS: Malnutrition related to inadequate oral intake as evidenced by severe muscle loss and intake </= 75% of estimated energy intake for >/= 1 month. Ongoing.   Goal: Intake to meet >90% of estimated nutrition needs. Improving.   Monitor:  PO intake, labs, weight trend.  ASSESSMENT: Patient was admitted on 8/23 with recurrent seizures. Patient was in the ED on 8/20 with a headache lasting several days; MRI of the brain was unremarkable at that time. CT of head on this admission showed known remote hemorrhagic infarct, without acute changes.Keppra was increased to 500 mg BID. S/P lumbar puncture 8/23; CSF was unremarkable with no signs of acute meningitis. Aspiration PNA suspected.   Pt sleeping at time of RD visit. Eating a Carb Mod Medium diet, fair intake per husband. Assisted with ordering lunch and encouraged intake of Magic Cups for increased protein intake.  Confirmed with MD that pt is currently experiencing refeeding syndrome. Pt is eating 100% of all meals. Discussed changing diet to Carbohydrate Modified Low. MD in agreement.  Height: Ht Readings from Last 1 Encounters:  06/25/13 5\' 5"  (1.651 m)    Weight: Wt Readings from Last 1 Encounters:  06/26/13 110 lb 7.2 oz (50.1 kg)  06/25/13 100 lb (ADMISSION  WEIGHT)  BMI:  Body mass index is 18.38 kg/(m^2). Underweight  Estimated Nutritional Needs: Kcal: 1400-1600 Protein: 70-80 gm Fluid: 1.4-1.6 L  Skin: no problems  Diet Order: Carb Control Medium   No intake or output data in the 24 hours ending 07/02/13 1334  Last BM: 8/27  Labs:   Recent Labs Lab 06/30/13 1205 07/01/13 0500  07/01/13 1346 07/01/13 2009 07/02/13 0506  NA 133* 145  --   --   --  143  K 3.1* 2.4*  < > 4.0 2.9* 2.9*  CL 96 105  --   --   --  101  CO2 29 28  --   --   --  31  BUN 12 8  --   --   --  8  CREATININE 1.15* 0.97  --   --   --  0.96  CALCIUM 7.2* 7.7*  --   --   --  7.8*  MG 0.7*  --   < > 2.4 1.7 1.5  PHOS  --   --   --   --  0.8*  --   GLUCOSE 240* 60*  --   --   --  104*  < > = values in this interval not displayed.  CBG (last 3)   Recent Labs  07/02/13 0419 07/02/13 0840 07/02/13 1128  GLUCAP 117* 263* 253*    Scheduled Meds: . insulin aspart  0-15 Units Subcutaneous Q4H  . insulin detemir  8 Units Subcutaneous QHS  . levETIRAcetam  500 mg Oral BID  . levofloxacin (LEVAQUIN) IV  750 mg Intravenous Q48H  . phosphorus  500 mg Oral QID  .  potassium chloride  10 mEq Intravenous Q1 Hr x 3  . sodium chloride  3 mL Intravenous Q12H    Continuous Infusions: . sodium chloride 125 mL/hr at 06/30/13 1307    Jarold Motto MS, RD, LDN Pager: [phone] After-hours pager: [phone]

## 2013-07-02 NOTE — Progress Notes (Signed)
[facility] PROGRESS NOTE  [name] [name] ION:[id number] DOB: 1941/01/01 DOA: 06/25/2013 PCP: Aida Puffer, MD  Brief narrative:  72 y.o. female with a hx of previous intracranial hemorrhage, seizures, HTN  and diabetes who presented to the ED with recurrent seizures. She presented to the ED on 8/20 with complaints of a headache lasting several days. This was associated with a left visual field cut. Neurology was consulted, and per recs, the patient underwent an MRI of the brain which was unremarkable. The patient was discharged home from the ED and treated symptomatically for her headache. On the morning of admission, ( 8/23) , the patient had a witnessed seizure. EMS was called. Upon EMS arrival, the patient was alert/oriented and the family refused transport. The pt then had another seizure and was transported to the ED. In the ED, the pt had another witnessed seizure lasting about 45sec. A CT head was done, which redemonstrated the known remote hmorrhagic infarct, without acute changes. Neurology was consulted and admitted to step down unit. 06/29/2013 patient sleepy, but arousable answers questions appropriately. Husband at bedside stated that a week prior to hospitalization episodes of passing out in the bathroom, and would be confused when she regained consciousness (sounds postictal). States prior to hospitalization last generalized seizure 10 months ago in October 2013. States yesterday again had an episode of passing out while in the bathroom (was not having BM) husband unsure whether she was more confused after she regained consciousness. Patient states currently no headache 06/30/2013 husband and nurse report that patient has had several incidences of what appears to be LOC while using the restroom (each time has been urination). At the time of LOC patient was hypotensive, today's episode patient was BP=98/62, HR= 102. After approximately 5 minutes patient came out of episode, negative postictal  signs or symptoms. 07/01/2013 feels much better, and is eating well ( hamburger french fries), only complaint is some soreness around her left gluteus maximus/ iliac crest which she attributes to sleeping in the hospital bed TODAY explained the necessity for placing a central line to husband and patient and they both agreed to procedure. 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 Continue Keppra 500 mg by mouth twice a day discharge home. NOTE /OT both recommend CIR vs SNF.   -- ReConsult for inpatient neuro rehabilitation placed. Believe Pt would do better w/ a course of Inpatient Rehab   Fever/leukocytosis Present on admission. Concern for aspiration pneumonia. The levofloxacin day 6/7. Continued high WBC. Aspiration pneumonia vs Demargination from seizures --Counseled patient and family WBC have dec 12.5 --  patient feeling better clinically will obtain pan culture, chest x-ray   DM type 2  CBGs are still mildly high however has also had some low readings, therefore continue Levemir 8 units each bedtime, SSI to moderate scale.  --06/29/2013 hemoglobin A1c= 8.6.  --06/29/2013 lipid panel within ADA guideline  HTN  --Patient's BP running high however no further episodes of micturition mediated syncope. Continue to hold all BP medication  CKD stage 3, GFR 30-59 ml/min  Renal fn stable  Diffuse T wave changes  Noted on EKG in ED. 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 Recommend regular diet   Thrush  IV Diflucan completed 7 days treatment   Anxiety/agitation; when necessary Ativan   Syncope (micturition mediated)  EKG, along  with consult from cardiology and neurology do not support Autonomic dysfunction. Patient appears to have episodes of micturition mediated hypo-tension.  --- Continue to hold all BP  medication --- Ensure placement of a compression stockings   Hypokalemia; Patient most likely suffering from refeeding syndrome --Replete potassium.     Hypomagnesemia; Patient most likely suffering from refeeding syndrome   --Replete magnesium  Refeeding syndrome --Obtain potassium, magnesium, phosphorus levels daily   Hypophosphatemia; Patient most likely suffering from refeeding syndrome   --Continue to aggressively replete  Severe malnutrition; his pre-albumin= 6.9 (reference range 17-34mg /dL) --Patient now increased consumption which is resulting in depletion of her electrolytes --Consult nutrition for appropriate balanced nutrition for correction of severe malnutrition  Code Status: FULL  Family Communication: Spoke with husband concerning plan of care Done at bedside Disposition Plan: Physical therapy decrements CIR. Seen by CIR and recommend  home health  Consultants:  Neurology   Procedures:  8/24 - LP per Neuro  CSF culture negative CSF Gram stain Negative CSF cell count negative CSF glucose high   Procedure CT head without contrast 06/22/2013 IMPRESSION:  1. No acute intracranial findings.  2. Known remote hemorrhagic infarction in the right posteromedial  temporal lobe.  3. Periventricular white matter and corona radiata hypodensities  favor chronic ischemic microvascular white matter disease.  MRI brain without contrast 06/22/2013 IMPRESSION:  No acute infarction. Moderate chronic small vessel changes of the  cerebral hemispheric white matter. Old hemorrhagic infarction in  the right posteromedial temporal lobe that could effect the optic  radiations on the right.    Antibiotics:  Ampicillin 8/23 >> 8/24  Rocephin 8/23 >> 8/24  Vancomycin 8/23 >> 8/24  Levaquin 8/23, day 6/7 IV Diflucan 8/24-8/30, day 7/7   DVT prophylaxis:  SCDs   PCXR 07/02/2013 S/P placement of right IJ central line  Findings: A new right jugular central line is noted. No   pneumothorax is seen. Catheter tip is noted at the cavoatrial  junction. The lungs are clear. Cardiac shadow is stable.  IMPRESSION:  No evidence of pneumothorax following central line placement     Filed Vitals:   07/02/13 0514  BP: 133/114  Pulse: 123  Temp:   Resp:    No intake or output data in the 24 hours ending 07/02/13 0728 Filed Weights   06/25/13 1315 06/26/13 0438  Weight: 45.8 kg (100 lb 15.5 oz) 50.1 kg (110 lb 7.2 oz)    Exam:   General: Alert,NAD    Cardiovascular; regular rhythm tachycardic, negative murmurs rubs gallops, DP/PT pulse 1+ bilateral  Chest: Clear to auscultation bilaterally  Abdomen: Soft, nontender, nondistended, bowel sounds present  Extremities: Warm, no edema  CNS: Alert and oriented x4 (following LOC), cranial nerves II through XII intact    Data Reviewed: Basic Metabolic Panel:  Recent Labs Lab 06/27/13 0530 06/28/13 0508 06/30/13 0705 06/30/13 1205 07/01/13 0500 07/01/13 0750 07/01/13 1346 07/01/13 2009  NA 143 139 133* 133* 145  --   --   --   K 3.8 3.5 2.3* 3.1* 2.4* 2.3* 4.0 2.9*  CL 106 104 95* 96 105  --   --   --   CO2 17* 18* 27 29 28   --   --   --   GLUCOSE 158* 220* 112* 240* 60*  --   --   --   BUN 34* 24* 13 12 8   --   --   --   CREATININE 1.03 0.96 0.89 1.15* 0.97  --   --   --  CALCIUM 7.9* 7.8* 7.2* 7.2* 7.7*  --   --   --   MG  --   --   --  0.7*  --  1.3* 2.4 1.7  PHOS  --   --   --   --   --   --   --  0.8*   Liver Function Tests:  Recent Labs Lab 06/26/13 0750 06/27/13 0530 06/30/13 0705 06/30/13 1205 07/01/13 0500  AST 28 31 34 34 32  ALT 16 18 19 19 19   ALKPHOS 68 64 66 74 76  BILITOT 0.4 0.3 0.5 0.5 0.5  PROT 6.7 6.4 5.5* 5.5* 6.1  ALBUMIN 3.1* 2.9* 2.5* 2.4* 2.6*   No results found for this basename: LIPASE, AMYLASE,  in the last 168 hours No results found for this basename: AMMONIA,  in the last 168 hours CBC:  Recent Labs Lab 06/25/13 0741  06/27/13 0530 06/27/13 1130  06/29/13 0540 06/30/13 0705 07/01/13 0500  WBC 12.2*  < > 13.7* 13.3* 14.0* 15.6* 18.4*  NEUTROABS 7.8*  --   --   --   --  11.7* 13.6*  HGB 12.4  < > 11.5* 11.1* 11.8* 10.2* 10.8*  HCT 36.9  < > 35.0* 32.6* 35.2* 29.9* 31.8*  MCV 96.6  < > 97.8 95.9 94.9 93.4 94.4  PLT 243  < > 203 222 221 176 239  < > = values in this interval not displayed. Cardiac Enzymes:  Recent Labs Lab 06/27/13 0530 06/27/13 1130 06/28/13 0508  CKTOTAL 347*  --   --   TROPONINI  --  <0.30 <0.30   BNP (last 3 results) No results found for this basename: PROBNP,  in the last 8760 hours CBG:  Recent Labs Lab 07/01/13 1634 07/01/13 2034 07/02/13 0008 07/02/13 0354 07/02/13 0419  GLUCAP 275* 278* 174* 51* 117*    Recent Results (from the past 240 hour(s))  MRSA PCR SCREENING     Status: None   Collection Time    06/25/13  1:25 PM      Result Value Range Status   MRSA by PCR NEGATIVE  NEGATIVE Final   Comment:            The GeneXpert MRSA Assay (FDA     approved for NASAL specimens     only), is one component of a     comprehensive MRSA colonization     surveillance program. It is not     intended to diagnose MRSA     infection nor to guide or     monitor treatment for     MRSA infections.  CULTURE, BLOOD (ROUTINE X 2)     Status: None   Collection Time    06/25/13  4:15 PM      Result Value Range Status   Specimen Description BLOOD RIGHT ARM   Final   Special Requests     Final   Value: BOTTLES DRAWN AEROBIC AND ANAEROBIC 10CC BLUE,5CC RED   Culture  Setup Time     Final   Value: 06/25/2013 22:13     Performed at [facility]   Culture     Final   Value: NO GROWTH 5 DAYS     Performed at [facility]   Report Status 07/01/2013 FINAL   Final  CULTURE, BLOOD (ROUTINE X 2)     Status: None   Collection Time    06/25/13  4:30 PM      Result Value Range  Status   Specimen Description BLOOD RIGHT HAND   Final   Special Requests BOTTLES DRAWN AEROBIC ONLY 10CC   Final    Culture  Setup Time     Final   Value: 06/25/2013 22:13     Performed at [facility]   Culture     Final   Value: NO GROWTH 5 DAYS     Performed at [facility]   Report Status 07/01/2013 FINAL   Final  GRAM STAIN     Status: None   Collection Time    06/25/13  7:58 PM      Result Value Range Status   Specimen Description CSF   Final   Special Requests TUBE 2   Final   Gram Stain     Final   Value: CYTOSPIN SLIDE     WBC PRESENT, PREDOMINANTLY MONONUCLEAR     NO ORGANISMS SEEN   Report Status 06/25/2013 FINAL   Final  CSF CULTURE     Status: None   Collection Time    06/25/13  7:58 PM      Result Value Range Status   Specimen Description CSF   Final   Special Requests TUBE 2   Final   Gram Stain     Final   Value: WBC PRESENT, PREDOMINANTLY MONONUCLEAR     NO ORGANISMS SEEN     CYTOSPIN Performed at [facility]     Performed at [facility]   Culture     Final   Value: NO GROWTH 3 DAYS     Performed at [facility]   Report Status 06/29/2013 FINAL   Final     Studies: Dg Chest 2 View  07/01/2013   *RADIOLOGY REPORT*  Clinical Data: Increasing white blood cell count.  Aspiration pneumonia.  Infection.  CHEST - 2 VIEW  Comparison: 06/28/2013 and multiple priors dating back to 08/28/2012.  Findings: Asymmetric right pleural apical scarring is chronic. There is no airspace disease.  Chronic bronchitic changes are present. The cardiopericardial silhouette and mediastinal contours are within normal limits.  Monitoring leads are projected over the chest.  IMPRESSION: No active cardiopulmonary disease.  Chronic bronchitic changes in both lower lobes.   Original Report Authenticated By: Andreas Newport, M.D.    Scheduled Meds: . fluconazole  100 mg Oral Q24H  . insulin aspart  0-15 Units Subcutaneous Q4H  . insulin detemir  8 Units Subcutaneous QHS  . levETIRAcetam  500 mg Oral BID  . levofloxacin (LEVAQUIN) IV  750 mg Intravenous Q48H   . phosphorus  500 mg Oral QID  . potassium chloride  10 mEq Intravenous Q1 Hr x 3  . sodium chloride  3 mL Intravenous Q12H   Continuous Infusions: . sodium chloride 125 mL/hr at 06/30/13 1307      Time spent: 50 minutes    Samrat Hayward, Roselind Messier  [facility] Pager [phone] If 7PM-7AM, please contact night-coverage at [email or web address], password [email or web address] 07/02/2013, 7:28 AM  LOS: 7 days

## 2013-07-02 NOTE — Progress Notes (Signed)
Phosphorus level 0.8 at 2008 as per lab. Dr. Joseph Art made aware with new order of K Phos 500mg  po 4 times daily. IV to right hand removed due to infiltration at 0400. For PICC line/TLC in am as per Dr. Joseph Art. Blood sugar 51 at 0400, remains alert to self and talking, 40z of OJ with 4 our packets of sugar given po. 0415 Blood sugar 117. Resting comfortably in bed. 24 hour urine for sodium and potassium in progress with collection ending at 2100 on 07/02/13

## 2013-07-02 NOTE — Procedures (Signed)
After explaining risks and benefits of placing a central line [name] [name] signed the consent form. Also explained the risk and benefits to the patient who agreed to procedure. After a time out was conducted the area was prepped in a sterile fashion.  Initially attempted a left-sided subclavian placement of a triple-lumen central line and was unsuccessful. However was successful in placing a right sided IJ central line. Patient tolerated procedure well minimal bleeding. Area was dressed in a sterile fashion, stat PCXR was obtained which showed correct placement of the line   Time 40 minutes

## 2013-07-02 NOTE — Progress Notes (Signed)
Consent obtained from husband [name] [name] for PICC/TLC insertion

## 2013-07-03 LAB — COMPREHENSIVE METABOLIC PANEL
AST: 23 U/L (ref 0–37)
Albumin: 2.2 g/dL — ABNORMAL LOW (ref 3.5–5.2)
BUN: 12 mg/dL (ref 6–23)
Calcium: 7.5 mg/dL — ABNORMAL LOW (ref 8.4–10.5)
Chloride: 99 mEq/L (ref 96–112)
Creatinine, Ser: 1 mg/dL (ref 0.50–1.10)
Total Protein: 5.7 g/dL — ABNORMAL LOW (ref 6.0–8.3)

## 2013-07-03 LAB — CBC WITH DIFFERENTIAL/PLATELET
Eosinophils Absolute: 0.1 10*3/uL (ref 0.0–0.7)
Eosinophils Relative: 1 % (ref 0–5)
HCT: 29.1 % — ABNORMAL LOW (ref 36.0–46.0)
Hemoglobin: 9.5 g/dL — ABNORMAL LOW (ref 12.0–15.0)
Lymphocytes Relative: 25 % (ref 12–46)
Lymphs Abs: 2.8 10*3/uL (ref 0.7–4.0)
MCH: 31.4 pg (ref 26.0–34.0)
MCV: 96 fL (ref 78.0–100.0)
Monocytes Relative: 16 % — ABNORMAL HIGH (ref 3–12)
RBC: 3.03 MIL/uL — ABNORMAL LOW (ref 3.87–5.11)
WBC: 11.1 10*3/uL — ABNORMAL HIGH (ref 4.0–10.5)

## 2013-07-03 LAB — GLUCOSE, CAPILLARY
Glucose-Capillary: 96 mg/dL (ref 70–99)
Glucose-Capillary: 98 mg/dL (ref 70–99)

## 2013-07-03 LAB — PHOSPHORUS: Phosphorus: 3.3 mg/dL (ref 2.3–4.6)

## 2013-07-03 LAB — MAGNESIUM: Magnesium: 1.1 mg/dL — ABNORMAL LOW (ref 1.5–2.5)

## 2013-07-03 MED ORDER — MAGNESIUM SULFATE 40 MG/ML IJ SOLN
2.0000 g | Freq: Once | INTRAMUSCULAR | Status: AC
  Administered 2013-07-03: 2 g via INTRAVENOUS
  Filled 2013-07-03: qty 50

## 2013-07-03 MED ORDER — MAGNESIUM SULFATE 40 MG/ML IJ SOLN
4.0000 g | Freq: Once | INTRAMUSCULAR | Status: AC
  Administered 2013-07-03: 4 g via INTRAVENOUS
  Filled 2013-07-03: qty 100

## 2013-07-03 MED ORDER — SODIUM CHLORIDE 0.9 % IV SOLN
INTRAVENOUS | Status: DC
  Administered 2013-07-03 – 2013-07-07 (×4): via INTRAVENOUS

## 2013-07-03 MED ORDER — MENTHOL 3 MG MT LOZG
1.0000 | LOZENGE | OROMUCOSAL | Status: DC | PRN
  Filled 2013-07-03: qty 9

## 2013-07-03 MED ORDER — POTASSIUM CHLORIDE 10 MEQ/50ML IV SOLN
10.0000 meq | INTRAVENOUS | Status: AC
  Administered 2013-07-03: 10 meq via INTRAVENOUS
  Filled 2013-07-03 (×5): qty 50

## 2013-07-03 MED ORDER — POTASSIUM CHLORIDE 10 MEQ/50ML IV SOLN
10.0000 meq | INTRAVENOUS | Status: AC
  Administered 2013-07-03 (×2): 10 meq via INTRAVENOUS
  Filled 2013-07-03 (×6): qty 50

## 2013-07-03 MED ORDER — DILTIAZEM HCL 30 MG PO TABS
30.0000 mg | ORAL_TABLET | Freq: Four times a day (QID) | ORAL | Status: DC
  Administered 2013-07-03 – 2013-07-07 (×13): 30 mg via ORAL
  Filled 2013-07-03 (×23): qty 1

## 2013-07-03 MED ORDER — POTASSIUM CHLORIDE 10 MEQ/50ML IV SOLN
10.0000 meq | INTRAVENOUS | Status: AC
  Administered 2013-07-03 (×3): 10 meq via INTRAVENOUS
  Filled 2013-07-03 (×3): qty 50

## 2013-07-03 MED ORDER — DILTIAZEM HCL 30 MG PO TABS: 30.0000 mg | ORAL_TABLET | Freq: Four times a day (QID) | ORAL | Status: DC

## 2013-07-03 MED ORDER — SODIUM CHLORIDE 0.9 % IJ SOLN
10.0000 mL | INTRAMUSCULAR | Status: DC | PRN
  Administered 2013-07-03 (×2): 20 mL
  Administered 2013-07-04: 10 mL
  Administered 2013-07-04 – 2013-07-05 (×2): 20 mL
  Administered 2013-07-05: 10 mL
  Administered 2013-07-06: 30 mL
  Administered 2013-07-07 (×2): 20 mL
  Administered 2013-07-08: 10 mL

## 2013-07-03 NOTE — Progress Notes (Signed)
Pt had a stable day without any events of seizure, has v good appetite. Is getting IV K+ & Mg, called IV team to draw blood after first four runs of K+ and two Mg. No concerns. Danne [place]

## 2013-07-03 NOTE — Progress Notes (Signed)
Lab called with critical lab value, Pottasium 2.5. Informed Dr.Woods and also informed about the tachy with HR up to 170's. Will close monitoring. [name] [name]

## 2013-07-03 NOTE — Progress Notes (Signed)
Patient Name: [name] [name]      SUBJECTIVE:   Much more awake.   No recurrent syncope after stopping home meds. Potassium low and being supplemented. Central line placed and CVP very low. K 2.5 this am and got 3 runs. Mag 1.1  HR remains 110-120s appears MAT on tele.     Past Medical History  Diagnosis Date  . Diabetes mellitus   . GERD (gastroesophageal reflux disease)   . Seizures   . Cataract   . Anxiety   . Stroke 2013    Secondary to acute right posterior temporo-occipital intra-axial hemorrhage    Scheduled Meds:  Scheduled Meds: . diltiazem  30 mg Oral Q6H  . insulin aspart  0-15 Units Subcutaneous Q4H  . insulin detemir  8 Units Subcutaneous QHS  . levETIRAcetam  500 mg Oral BID  . levofloxacin (LEVAQUIN) IV  750 mg Intravenous Q48H  . magnesium sulfate 1 - 4 g bolus IVPB  4 g Intravenous Once  . phosphorus  500 mg Oral QID  . sodium chloride  3 mL Intravenous Q12H   Continuous Infusions: . sodium chloride 125 mL/hr at 06/30/13 1307  . sodium chloride      PHYSICAL EXAM Filed Vitals:   07/03/13 0618 07/03/13 0730 07/03/13 0731 07/03/13 0732  BP: 156/83 138/62 139/59 106/47  Pulse: 85 132 124 87  Temp: 98 F (36.7 C)     TempSrc: Oral     Resp: 20     Height:      Weight:      SpO2: 100%      Thin frail woman lying flat in bed in NAD Neck JVP flat Carotids 1+ bialterally Cor IRR IRR tachy  Lungs: clear Ab: soft NT/ND No edema or cyanosis Awake. Moves all 4 without difficulty   TELEMETRY: Appears to be NSR and MAT average rate now close to 120    Intake/Output Summary (Last 24 hours) at 07/03/13 1135 Last data filed at 07/03/13 0900  Gross per 24 hour  Intake    360 ml  Output   2950 ml  Net  -2590 ml    LABS: Basic Metabolic Panel:  Recent Labs Lab 06/27/13 0530 06/28/13 0508 06/30/13 0705  06/30/13 1205 07/01/13 0500 07/01/13 0750 07/01/13 1346 07/01/13 2009 07/02/13 0506 07/02/13 1400 07/03/13 0450  NA 143 139 133*   --  133* 145  --   --   --  143  --  142  K 3.8 3.5 2.3*  --  3.1* 2.4* 2.3* 4.0 2.9* 2.9* 3.0* 2.5*  CL 106 104 95*  --  96 105  --   --   --  101  --  99  CO2 17* 18* 27  --  29 28  --   --   --  31  --  34*  GLUCOSE 158* 220* 112*  --  240* 60*  --   --   --  104*  --  60*  BUN 34* 24* 13  --  12 8  --   --   --  8  --  12  CREATININE 1.03 0.96 0.89  --  1.15* 0.97  --   --   --  0.96  --  1.00  CALCIUM 7.9* 7.8* 7.2*  --  7.2* 7.7*  --   --   --  7.8*  --  7.5*  MG  --   --   --   < > 0.7*  --  1.3* 2.4 1.7 1.5  --  1.1*  PHOS  --   --   --   --   --   --   --   --  0.8*  --   --  3.3  < > = values in this interval not displayed. Cardiac Enzymes: No results found for this basename: CKTOTAL, CKMB, CKMBINDEX, TROPONINI,  in the last 72 hours CBC:  Recent Labs Lab 06/27/13 0530 06/27/13 1130 06/29/13 0540 06/30/13 0705 07/01/13 0500 07/02/13 0506 07/03/13 0450  WBC 13.7* 13.3* 14.0* 15.6* 18.4* 12.8* 11.1*  NEUTROABS  --   --   --  11.7* 13.6* 9.7* 6.4  HGB 11.5* 11.1* 11.8* 10.2* 10.8* 10.6* 9.5*  HCT 35.0* 32.6* 35.2* 29.9* 31.8* 31.4* 29.1*  MCV 97.8 95.9 94.9 93.4 94.4 94.6 96.0  PLT 203 222 221 176 239 219 235   PROTIME: No results found for this basename: LABPROT, INR,  in the last 72 hours Liver Function Tests:  Recent Labs  07/02/13 0506 07/03/13 0450  AST 29 23  ALT 18 16  ALKPHOS 91 88  BILITOT 0.5 0.5  PROT 6.1 5.7*  ALBUMIN 2.2* 2.2*   No results found for this basename: LIPASE, AMYLASE,  in the last 72 hours BNP: BNP (last 3 results) No results found for this basename: PROBNP,  in the last 8760 hours D-Dimer: No results found for this basename: DDIMER,  in the last 72 hours Hemoglobin A1C: No results found for this basename: HGBA1C,  in the last 72 hours Fasting Lipid Panel: No results found for this basename: CHOL, HDL, LDLCALC, TRIG, CHOLHDL, LDLDIRECT,  in the last 72 hours Thyroid Function Tests:  Recent Labs  07/02/13 0506  TSH 0.365      ASSESSMENT AND PLAN:  Principal Problem:   Seizure Active Problems:   DM type 2 (diabetes mellitus, type 2)   HTN (hypertension)   CKD (chronic kidney disease) stage 3, GFR 30-59 ml/min   Hemorrhage in the brain-13 x 22 x 14 mm right posterior temporo-occipital intra-axial acute   Syncope   Protein-calorie malnutrition, severe   Leukocytosis, unspecified   Hypokalemia   Hypomagnesemia   Tachycardia   Hypophosphatemia   Encounter for central line placement   K and Mag still very low despite repletion. I wonder if she has RTA? Will continue to supplement throug IV as she needs volume as well. May be worth having Renal see at some point. Tele looks like MAT. Will start low-dose diltiazem (30 q 6) as tolerated. Needs PT.   Migdalia Dk MD  07/03/2013

## 2013-07-03 NOTE — Progress Notes (Signed)
[facility] PROGRESS NOTE  [name] [name] ZOX:[id number] DOB: 1941-02-16 DOA: 06/25/2013 PCP: Aida Puffer, MD  Brief narrative:  72 y.o. female with a hx of previous intracranial hemorrhage, seizures, HTN  and diabetes who presented to the ED with recurrent seizures. She presented to the ED on 8/20 with complaints of a headache lasting several days. This was associated with a left visual field cut. Neurology was consulted, and per recs, the patient underwent an MRI of the brain which was unremarkable. The patient was discharged home from the ED and treated symptomatically for her headache. On the morning of admission, ( 8/23) , the patient had a witnessed seizure. EMS was called. Upon EMS arrival, the patient was alert/oriented and the family refused transport. The pt then had another seizure and was transported to the ED. In the ED, the pt had another witnessed seizure lasting about 45sec. A CT head was done, which redemonstrated the known remote hmorrhagic infarct, without acute changes. Neurology was consulted and admitted to step down unit. 06/29/2013 patient sleepy, but arousable answers questions appropriately. Husband at bedside stated that a week prior to hospitalization episodes of passing out in the bathroom, and would be confused when she regained consciousness (sounds postictal). States prior to hospitalization last generalized seizure 10 months ago in October 2013. States yesterday again had an episode of passing out while in the bathroom (was not having BM) husband unsure whether she was more confused after she regained consciousness. Patient states currently no headache 06/30/2013 husband and nurse report that patient has had several incidences of what appears to be LOC while using the restroom (each time has been urination). At the time of LOC patient was hypotensive, today's episode patient was BP=98/62, HR= 102. After approximately 5 minutes patient came out of episode, negative postictal  signs or symptoms. 07/01/2013 feels much better, and is eating well ( hamburger french fries), only complaint is some soreness around her left gluteus maximus/ iliac crest which she attributes to sleeping in the hospital bed TODAY explained the necessity for placing a central line to husband and patient and they both agreed to procedure. 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 Continue Keppra 500 mg by mouth twice a day discharge home. NOTE /OT both recommend CIR vs SNF.   -- ReConsult for inpatient neuro rehabilitation placed. Believe Pt would do better w/ a course of Inpatient Rehab   Fever/leukocytosis Present on admission. Concern for aspiration pneumonia. The levofloxacin day 6/7. Continued high WBC. Aspiration pneumonia vs Demargination from seizures --Counseled patient and family WBC have dec 12.5 --  patient feeling better clinically will obtain pan culture, chest x-ray   DM type 2  CBGs are still mildly high however has also had some low readings, therefore continue Levemir 8 units each bedtime, SSI to moderate scale.  --06/29/2013 hemoglobin A1c= 8.6.  --06/29/2013 lipid panel within ADA guideline  HTN  --Patient's BP running high however no further episodes of micturition mediated syncope. Continue to hold all BP medication  CKD stage 3, GFR 30-59 ml/min  Renal fn stable  Diffuse T wave changes  Noted on EKG in ED. 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 Recommend regular diet   Thrush  IV Diflucan completed 7 days treatment   Anxiety/agitation; when necessary Ativan   Syncope (micturition mediated)  EKG, along  with consult from cardiology and neurology do not support Autonomic dysfunction. Patient appears to have episodes of micturition mediated hypo-tension.  --- Continue to hold all BP  medication --- Ensure placement of a compression stockings   Hypokalemia; Patient most likely suffering from refeeding syndrome --Replete potassium.     Hypomagnesemia; Patient most likely suffering from refeeding syndrome   --Replete magnesium  Refeeding syndrome --Obtain potassium, magnesium, phosphorus levels daily   Hypophosphatemia; Patient most likely suffering from refeeding syndrome   --Continue to aggressively replete  Severe malnutrition; his pre-albumin= 6.9 (reference range 17-34mg /dL) --Patient now increased consumption which is resulting in depletion of her electrolytes --Consult nutrition for appropriate balanced nutrition for correction of severe malnutrition  Code Status: FULL  Family Communication: Spoke with husband concerning plan of care Done at bedside Disposition Plan: Physical therapy decrements CIR. Seen by CIR and recommend  home health  Consultants:  Neurology   Procedures:  8/24 - LP per Neuro  CSF culture negative CSF Gram stain Negative CSF cell count negative CSF glucose high   Procedure CT head without contrast 06/22/2013 IMPRESSION:  1. No acute intracranial findings.  2. Known remote hemorrhagic infarction in the right posteromedial  temporal lobe.  3. Periventricular white matter and corona radiata hypodensities  favor chronic ischemic microvascular white matter disease.  MRI brain without contrast 06/22/2013 IMPRESSION:  No acute infarction. Moderate chronic small vessel changes of the  cerebral hemispheric white matter. Old hemorrhagic infarction in  the right posteromedial temporal lobe that could effect the optic  radiations on the right.    Antibiotics:  Ampicillin 8/23 >> 8/24  Rocephin 8/23 >> 8/24  Vancomycin 8/23 >> 8/24  Levaquin 8/23, day 6/7 IV Diflucan 8/24-8/30, day 7/7   DVT prophylaxis:  SCDs   PCXR 07/02/2013 S/P placement of right IJ central line  Findings: A new right jugular central line is noted. No   pneumothorax is seen. Catheter tip is noted at the cavoatrial  junction. The lungs are clear. Cardiac shadow is stable.  IMPRESSION:  No evidence of pneumothorax following central line placement     Filed Vitals:   07/03/13 0618  BP: 156/83  Pulse: 85  Temp: 98 F (36.7 C)  Resp: 20    Intake/Output Summary (Last 24 hours) at 07/03/13 0805 Last data filed at 07/03/13 9604  Gross per 24 hour  Intake    120 ml  Output   2650 ml  Net  -2530 ml   Filed Weights   06/25/13 1315 06/26/13 0438  Weight: 45.8 kg (100 lb 15.5 oz) 50.1 kg (110 lb 7.2 oz)    Exam:   General: Alert,NAD    Cardiovascular; regular rhythm tachycardic, negative murmurs rubs gallops, DP/PT pulse 1+ bilateral  Chest: Clear to auscultation bilaterally  Abdomen: Soft, nontender, nondistended, bowel sounds present  Extremities: Warm, no edema  CNS: Alert and oriented x4 (following LOC), cranial nerves II through XII intact    Data Reviewed: Basic Metabolic Panel:  Recent Labs Lab 06/30/13 0705  06/30/13 1205 07/01/13 0500 07/01/13 0750 07/01/13 1346 07/01/13 2009 07/02/13 0506 07/02/13 1400 07/03/13 0450  NA 133*  --  133* 145  --   --   --  143  --  142  K 2.3*  --  3.1* 2.4* 2.3* 4.0 2.9* 2.9* 3.0* 2.5*  CL 95*  --  96 105  --   --   --  101  --  99  CO2 27  --  29 28  --   --   --  31  --  34*  GLUCOSE 112*  --  240* 60*  --   --   --  104*  --  60*  BUN 13  --  12 8  --   --   --  8  --  12  CREATININE 0.89  --  1.15* 0.97  --   --   --  0.96  --  1.00  CALCIUM 7.2*  --  7.2* 7.7*  --   --   --  7.8*  --  7.5*  MG  --   < > 0.7*  --  1.3* 2.4 1.7 1.5  --  1.1*  PHOS  --   --   --   --   --   --  0.8*  --   --  3.3  < > = values in this interval not displayed. Liver Function Tests:  Recent Labs Lab 06/30/13 0705 06/30/13 1205 07/01/13 0500 07/02/13 0506 07/03/13 0450  AST 34 34 32 29 23  ALT 19 19 19 18 16   ALKPHOS 66 74 76 91 88  BILITOT 0.5 0.5 0.5 0.5 0.5  PROT  5.5* 5.5* 6.1 6.1 5.7*  ALBUMIN 2.5* 2.4* 2.6* 2.2* 2.2*   No results found for this basename: LIPASE, AMYLASE,  in the last 168 hours No results found for this basename: AMMONIA,  in the last 168 hours CBC:  Recent Labs Lab 06/29/13 0540 06/30/13 0705 07/01/13 0500 07/02/13 0506 07/03/13 0450  WBC 14.0* 15.6* 18.4* 12.8* 11.1*  NEUTROABS  --  11.7* 13.6* 9.7* 6.4  HGB 11.8* 10.2* 10.8* 10.6* 9.5*  HCT 35.2* 29.9* 31.8* 31.4* 29.1*  MCV 94.9 93.4 94.4 94.6 96.0  PLT 221 176 239 219 235   Cardiac Enzymes:  Recent Labs Lab 06/27/13 0530 06/27/13 1130 06/28/13 0508  CKTOTAL 347*  --   --   TROPONINI  --  <0.30 <0.30   BNP (last 3 results) No results found for this basename: PROBNP,  in the last 8760 hours CBG:  Recent Labs Lab 07/02/13 1647 07/02/13 2000 07/03/13 0008 07/03/13 0402 07/03/13 0531  GLUCAP 306* 218* 280* 79 96    Recent Results (from the past 240 hour(s))  MRSA PCR SCREENING     Status: None   Collection Time    06/25/13  1:25 PM      Result Value Range Status   MRSA by PCR NEGATIVE  NEGATIVE Final   Comment:            The GeneXpert MRSA Assay (FDA     approved for NASAL specimens     only), is one component of a     comprehensive MRSA colonization     surveillance program. It is not     intended to diagnose MRSA     infection nor to guide or     monitor treatment for     MRSA infections.  CULTURE, BLOOD (ROUTINE X 2)     Status: None   Collection Time    06/25/13  4:15 PM      Result Value Range Status   Specimen Description BLOOD RIGHT ARM   Final   Special Requests     Final   Value: BOTTLES DRAWN AEROBIC AND ANAEROBIC 10CC BLUE,5CC RED   Culture  Setup Time     Final   Value: 06/25/2013 22:13     Performed at [facility]   Culture     Final   Value:  NO GROWTH 5 DAYS     Performed at [facility]   Report Status 07/01/2013 FINAL   Final  CULTURE, BLOOD (ROUTINE X 2)     Status: None   Collection Time     06/25/13  4:30 PM      Result Value Range Status   Specimen Description BLOOD RIGHT HAND   Final   Special Requests BOTTLES DRAWN AEROBIC ONLY 10CC   Final   Culture  Setup Time     Final   Value: 06/25/2013 22:13     Performed at [facility]   Culture     Final   Value: NO GROWTH 5 DAYS     Performed at [facility]   Report Status 07/01/2013 FINAL   Final  GRAM STAIN     Status: None   Collection Time    06/25/13  7:58 PM      Result Value Range Status   Specimen Description CSF   Final   Special Requests TUBE 2   Final   Gram Stain     Final   Value: CYTOSPIN SLIDE     WBC PRESENT, PREDOMINANTLY MONONUCLEAR     NO ORGANISMS SEEN   Report Status 06/25/2013 FINAL   Final  CSF CULTURE     Status: None   Collection Time    06/25/13  7:58 PM      Result Value Range Status   Specimen Description CSF   Final   Special Requests TUBE 2   Final   Gram Stain     Final   Value: WBC PRESENT, PREDOMINANTLY MONONUCLEAR     NO ORGANISMS SEEN     CYTOSPIN Performed at [facility]     Performed at [facility]   Culture     Final   Value: NO GROWTH 3 DAYS     Performed at [facility]   Report Status 06/29/2013 FINAL   Final  CULTURE, BLOOD (ROUTINE X 2)     Status: None   Collection Time    07/01/13  8:45 AM      Result Value Range Status   Specimen Description BLOOD RIGHT ARM   Final   Special Requests BOTTLES DRAWN AEROBIC AND ANAEROBIC 10 ML   Final   Culture  Setup Time     Final   Value: 07/01/2013 15:35     Performed at [facility]   Culture     Final   Value:        BLOOD CULTURE RECEIVED NO GROWTH TO DATE CULTURE WILL BE HELD FOR 5 DAYS BEFORE ISSUING A FINAL NEGATIVE REPORT     Performed at [facility]   Report Status PENDING   Incomplete  CULTURE, BLOOD (ROUTINE X 2)     Status: None   Collection Time    07/01/13  9:00 AM      Result Value Range Status   Specimen Description BLOOD LEFT ARM   Final   Special  Requests BOTTLES DRAWN AEROBIC AND ANAEROBIC 10 ML   Final   Culture  Setup Time     Final   Value: 07/01/2013 15:35     Performed at [facility]   Culture     Final   Value:        BLOOD CULTURE RECEIVED NO GROWTH TO DATE CULTURE WILL BE HELD FOR 5 DAYS BEFORE ISSUING A FINAL NEGATIVE REPORT  Performed at [facility]   Report Status PENDING   Incomplete  URINE CULTURE     Status: None   Collection Time    07/01/13  5:44 PM      Result Value Range Status   Specimen Description URINE, RANDOM   Final   Special Requests NONE   Final   Culture  Setup Time     Final   Value: 07/01/2013 18:07     Performed at [facility] Count     Final   Value: NO GROWTH     Performed at [facility]   Culture     Final   Value: NO GROWTH     Performed at [facility]   Report Status 07/02/2013 FINAL   Final     Studies: Dg Chest 2 View  07/01/2013   *RADIOLOGY REPORT*  Clinical Data: Increasing white blood cell count.  Aspiration pneumonia.  Infection.  CHEST - 2 VIEW  Comparison: 06/28/2013 and multiple priors dating back to 08/28/2012.  Findings: Asymmetric right pleural apical scarring is chronic. There is no airspace disease.  Chronic bronchitic changes are present. The cardiopericardial silhouette and mediastinal contours are within normal limits.  Monitoring leads are projected over the chest.  IMPRESSION: No active cardiopulmonary disease.  Chronic bronchitic changes in both lower lobes.   Original Report Authenticated By: Andreas Newport, M.D.   Dg Chest Port 1 View  07/02/2013   *RADIOLOGY REPORT*  Clinical Data: Status post right-sided central line placement  PORTABLE CHEST - 1 VIEW  Comparison: 07/01/2013  Findings: A new right jugular central line is noted.  No pneumothorax is seen.  Catheter tip is noted at the cavoatrial junction.  The lungs are clear.  Cardiac shadow is stable.  IMPRESSION: No evidence of pneumothorax following central  line placement.   Original Report Authenticated By: Alcide Clever, M.D.    Scheduled Meds: . insulin aspart  0-15 Units Subcutaneous Q4H  . insulin detemir  8 Units Subcutaneous QHS  . levETIRAcetam  500 mg Oral BID  . levofloxacin (LEVAQUIN) IV  750 mg Intravenous Q48H  . phosphorus  500 mg Oral QID  . sodium chloride  3 mL Intravenous Q12H   Continuous Infusions: . sodium chloride 125 mL/hr at 06/30/13 1307      Time spent: 50 minutes    Carsyn Taubman, Roselind Messier  [facility] Pager [phone] If 7PM-7AM, please contact night-coverage at [email or web address], password [facility] 07/03/2013, 8:05 AM  LOS: 8 days

## 2013-07-04 LAB — CBC WITH DIFFERENTIAL/PLATELET
Basophils Relative: 0 % (ref 0–1)
Eosinophils Absolute: 0.2 10*3/uL (ref 0.0–0.7)
Eosinophils Relative: 2 % (ref 0–5)
Lymphs Abs: 1.6 10*3/uL (ref 0.7–4.0)
MCH: 32.2 pg (ref 26.0–34.0)
MCHC: 33.5 g/dL (ref 30.0–36.0)
MCV: 96.2 fL (ref 78.0–100.0)
Platelets: 257 10*3/uL (ref 150–400)
RBC: 2.86 MIL/uL — ABNORMAL LOW (ref 3.87–5.11)
RDW: 12.9 % (ref 11.5–15.5)

## 2013-07-04 LAB — COMPREHENSIVE METABOLIC PANEL
ALT: 18 U/L (ref 0–35)
AST: 30 U/L (ref 0–37)
Albumin: 2.1 g/dL — ABNORMAL LOW (ref 3.5–5.2)
Alkaline Phosphatase: 89 U/L (ref 39–117)
CO2: 30 mEq/L (ref 19–32)
Chloride: 99 mEq/L (ref 96–112)
GFR calc non Af Amer: 56 mL/min — ABNORMAL LOW (ref >90)
Potassium: 3.6 mEq/L (ref 3.5–5.1)
Sodium: 141 mEq/L (ref 135–145)
Total Bilirubin: 0.3 mg/dL (ref 0.3–1.2)

## 2013-07-04 LAB — GLUCOSE, CAPILLARY
Glucose-Capillary: 192 mg/dL — ABNORMAL HIGH (ref 70–99)
Glucose-Capillary: 43 mg/dL — CL (ref 70–99)
Glucose-Capillary: 94 mg/dL (ref 70–99)

## 2013-07-04 LAB — MAGNESIUM: Magnesium: 2.2 mg/dL (ref 1.5–2.5)

## 2013-07-04 LAB — PHOSPHORUS: Phosphorus: 4.4 mg/dL (ref 2.3–4.6)

## 2013-07-04 MED ORDER — POTASSIUM CHLORIDE 10 MEQ/100ML IV SOLN
10.0000 meq | INTRAVENOUS | Status: AC
  Administered 2013-07-04 (×4): 10 meq via INTRAVENOUS
  Filled 2013-07-04 (×4): qty 100

## 2013-07-04 MED ORDER — MAGNESIUM SULFATE 40 MG/ML IJ SOLN
2.0000 g | Freq: Once | INTRAMUSCULAR | Status: AC
  Administered 2013-07-04: 2 g via INTRAVENOUS
  Filled 2013-07-04: qty 50

## 2013-07-04 NOTE — Progress Notes (Signed)
Occupational Therapy Treatment Patient Details Name: Atziry Baranski MRN: [id number] DOB: [birth year] Today's Date: 07/04/2013 Time: 8295-6213 OT Time Calculation (min): 18 min  OT Assessment / Plan / Recommendation  History of present illness 72 y.o. female with a hx of previous intracranial hemorrhage, seizures, htn, and diabetes who presented to the ED with recurrent seizures.   OT comments  Pt demonstrates decr activity tolerance this session. See BP below. Pt eager to participate and willing to try all task therapist request. Pt remains high fall risk at this time. Pt with decr awareness to vital changes. Therapist constantly asking patient regarding any changes. Pt when prompted responding.  Orthostatic BPs  Supine 144/61  Sitting 129/49  Sitting after  min   Standing 122/46  Return to supine HR 149  151/63     Follow Up Recommendations  CIR;SNF;Supervision/Assistance - 24 hour    Barriers to Discharge       Equipment Recommendations  None recommended by OT    Recommendations for Other Services    Frequency Min 2X/week   Progress towards OT Goals Progress towards OT goals: Progressing toward goals  Plan Discharge plan remains appropriate    Precautions / Restrictions Precautions Precautions: Fall Restrictions Weight Bearing Restrictions: No   Pertinent Vitals/Pain I feel hot Resolved with return to supine    ADL  Grooming: Minimal assistance Where Assessed - Grooming: Supported standing Toilet Transfer: Minimal assistance Toilet Transfer Method: Sit to Barista: Regular height toilet Toileting - Clothing Manipulation and Hygiene: Minimal assistance Where Assessed - Toileting Clothing Manipulation and Hygiene: Sit to stand from 3-in-1 or toilet Transfers/Ambulation Related to ADLs: Pt with unsteady gait to bathroom leaning laterally in both directions. Pt needed hand held (A) to progress to bathroom.  ADL Comments: Pt orthostatics obtained  see chart in progress note session. Pt with incr HR 149. Pt completed bed mobility, toileting and grooming with need for rest break. pt reports "I feel HOT" "my blood sugar must be low"    OT Diagnosis:    OT Problem List:   OT Treatment Interventions:     OT Goals(current goals can now be found in the care plan section) Acute Rehab OT Goals Patient Stated Goal: Pt unable to state. Husband would like pt to come back home. OT Goal Formulation: With family Time For Goal Achievement: 07/12/13 Potential to Achieve Goals: Good ADL Goals Pt Will Perform Eating: with modified independence;sitting Pt Will Perform Grooming: with min assist;standing Pt Will Perform Upper Body Bathing: with min assist;sitting Pt Will Perform Lower Body Bathing: with min assist;sit to/from stand Pt Will Perform Upper Body Dressing: with min assist;sitting Pt Will Perform Lower Body Dressing: with min assist;sit to/from stand Pt Will Perform Toileting - Clothing Manipulation and hygiene: with min assist;sit to/from stand Additional ADL Goal #1: Pt will sustain attention to familiar ADL task x 3 mins with min verbal cues  Visit Information  Last OT Received On: 07/04/13 Assistance Needed: +1 (+ 2 safety) History of Present Illness: 72 y.o. female with a hx of previous intracranial hemorrhage, seizures, htn, and diabetes who presented to the ED with recurrent seizures.    Subjective Data      Prior Functioning       Cognition  Cognition Arousal/Alertness: Awake/alert Behavior During Therapy: WFL for tasks assessed/performed Overall Cognitive Status: Impaired/Different from baseline Area of Impairment: Safety/judgement;Awareness Safety/Judgement: Decreased awareness of safety;Decreased awareness of deficits Awareness: Emergent;Anticipatory Problem Solving: Slow processing    Mobility  Bed Mobility  Supine to Sit: 5: Supervision;HOB flat Sitting - Scoot to Edge of Bed: 5: Supervision Sit to Supine: 5:  Supervision;HOB flat Transfers Sit to Stand: 4: Min assist;With upper extremity assist;From bed Stand to Sit: 4: Min assist;With upper extremity assist;To toilet Details for Transfer Assistance: cues for safe hand placement    Exercises      Balance     End of Session OT - End of Session Activity Tolerance: Patient tolerated treatment well Patient left: in bed;with call bell/phone within reach;with family/visitor present Nurse Communication: Mobility status;Precautions  GO     Harolyn Rutherford 07/04/2013, 3:43 PM Pager: [phone]

## 2013-07-04 NOTE — Progress Notes (Signed)
Hypoglycemic Event  CBG: 43  Treatment: 15 GM carbohydrate snack  Symptoms: None  Follow-up CBG: Time:0329 CBG Result:109  Possible Reasons for Event: Unknown  Comments/MD notified:Hypoglycemic protocol    Tom-Johnson, Curlie Sittner Daphne  Remember to initiate Hypoglycemia Order Set & complete

## 2013-07-04 NOTE — Progress Notes (Signed)
Patient ID: [name] [name], female   DOB: [birth year], 72 y.o.   MRN: [id number]   SUBJECTIVE: The patient is stable. Her rhythm is more stable now. There is normal sinus rhythm. I spoke with Dr. Joseph Art about her care. He is helping oversee all of her electrolyte abnormalities.   Filed Vitals:   07/03/13 2150 07/04/13 0206 07/04/13 0500 07/04/13 0547  BP: 148/60 118/43  145/64  Pulse: 112 118  101  Temp: 98.3 F (36.8 C) 97.8 F (36.6 C)  97.6 F (36.4 C)  TempSrc: Oral Oral  Oral  Resp: 17 17  17   Height:      Weight:   114 lb 11.2 oz (52.028 kg)   SpO2: 98% 98%  100%    Intake/Output Summary (Last 24 hours) at 07/04/13 0854 Last data filed at 07/04/13 0741  Gross per 24 hour  Intake 10456.25 ml  Output   2700 ml  Net 7756.25 ml    LABS: Basic Metabolic Panel:  Recent Labs  40/98/11 0450 07/03/13 1725 07/04/13 0500  NA 142  --  141  K 2.5* 4.1 3.6  CL 99  --  99  CO2 34*  --  30  GLUCOSE 60*  --  106*  BUN 12  --  18  CREATININE 1.00  --  0.99  CALCIUM 7.5*  --  7.9*  MG 1.1* 4.5* 2.2  PHOS 3.3  --  4.3  4.4   Liver Function Tests:  Recent Labs  07/03/13 0450 07/04/13 0500  AST 23 30  ALT 16 18  ALKPHOS 88 89  BILITOT 0.5 0.3  PROT 5.7* 5.9*  ALBUMIN 2.2* 2.1*   No results found for this basename: LIPASE, AMYLASE,  in the last 72 hours CBC:  Recent Labs  07/03/13 0450 07/04/13 0500  WBC 11.1* 9.4  NEUTROABS 6.4 6.4  HGB 9.5* 9.2*  HCT 29.1* 27.5*  MCV 96.0 96.2  PLT 235 257   Cardiac Enzymes: No results found for this basename: CKTOTAL, CKMB, CKMBINDEX, TROPONINI,  in the last 72 hours BNP: No components found with this basename: POCBNP,  D-Dimer: No results found for this basename: DDIMER,  in the last 72 hours Hemoglobin A1C: No results found for this basename: HGBA1C,  in the last 72 hours Fasting Lipid Panel: No results found for this basename: CHOL, HDL, LDLCALC, TRIG, CHOLHDL, LDLDIRECT,  in the last 72 hours Thyroid Function  Tests:  Recent Labs  07/02/13 0506  TSH 0.365    RADIOLOGY: Dg Chest 2 View  07/01/2013   *RADIOLOGY REPORT*  Clinical Data: Increasing white blood cell count.  Aspiration pneumonia.  Infection.  CHEST - 2 VIEW  Comparison: 06/28/2013 and multiple priors dating back to 08/28/2012.  Findings: Asymmetric right pleural apical scarring is chronic. There is no airspace disease.  Chronic bronchitic changes are present. The cardiopericardial silhouette and mediastinal contours are within normal limits.  Monitoring leads are projected over the chest.  IMPRESSION: No active cardiopulmonary disease.  Chronic bronchitic changes in both lower lobes.   Original Report Authenticated By: Andreas Newport, M.D.   Ct Head Wo Contrast  06/25/2013   CLINICAL DATA:  Seizures.  History of CVA.  EXAM: CT HEAD WITHOUT CONTRAST  TECHNIQUE: Contiguous axial images were obtained from the base of the skull through the vertex without intravenous contrast.  COMPARISON:  06/22/2013  FINDINGS: The patient's known remote hemorrhagic infarction in the right posterior medial temporal lobe is only apparent as subtle hypodensity below the occipital horn  of the right lateral ventricle on CT, and difficult to separate out from the underlying mild chronic ischemic microvascular white matter disease.  Otherwise, the brainstem, cerebellum, cerebral peduncles, thalamus, basal ganglia, basilar cisterns, and ventricular system appear within normal limits. No intracranial hemorrhage, mass lesion, or acute CVA. Mild chronic right sphenoid sinusitis.  IMPRESSION: 1. No acute intracranial findings. 2. 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 3. Periventricular white matter and corona radiata hypodensities favor chronic ischemic microvascular white matter disease.   Electronically Signed   By: Herbie Baltimore   On: 06/25/2013 08:59   Ct Head Wo Contrast  06/22/2013   *RADIOLOGY REPORT*  Clinical Data: Headache   CT HEAD WITHOUT CONTRAST  Technique:  Contiguous axial images were obtained from the base of the skull through the vertex without contrast.  Comparison: MRI 12/12/2012.  Findings: No acute intracranial hemorrhage.  No focal mass lesion. No CT evidence of acute infarction.   No midline shift or mass effect.  No hydrocephalus.  Basilar cisterns are patent.  There is mild periventricular white matter hypodensities.  Mild encephalomalacia within the right occipital lobe. These findings are similar to comparison MRI.  Paranasal sinuses and mastoid air cells are clear.  Orbits are normal.  IMPRESSION:  1.  No acute intracranial findings. 2. 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   Original Report Authenticated By: Genevive Bi, M.D.   [name]  06/22/2013   *RADIOLOGY REPORT*  Clinical Data: Headache.  Left visual field cut.  MRI HEAD WITHOUT CONTRAST  Technique:  Multiplanar, multiecho pulse sequences of the brain and surrounding structures were obtained according to standard protocol without intravenous contrast.  Comparison: Head CT same day.  MRI 12/12/2012.  Findings: Diffusion imaging does not show any acute or subacute infarction.  The brainstem and cerebellum are unremarkable.  The cerebral hemispheres show moderate chronic small vessel change affecting the deep and subcortical white matter. There is an old hemorrhagic infarction in the right posteromedial temporal lobe. This was present previously and could explain the patient's symptoms.  No mass lesion, hemorrhage, hydrocephalus or extra-axial collection.  No pituitary mass.  No inflammatory sinus disease. There is a left nasal maxillary cyst which could emanate from a dental root.  IMPRESSION: No acute infarction.  Moderate chronic small vessel changes of the cerebral hemispheric white matter. Old  hemorrhagic infarction in the right posteromedial temporal  lobe that could effect the optic radiations on the right.   Original Report Authenticated By: Paulina Fusi, M.D.   Dg Chest Port 1 View  07/02/2013   *RADIOLOGY REPORT*  Clinical Data: Status post right-sided central line placement  PORTABLE CHEST - 1 VIEW  Comparison: 07/01/2013  Findings: A new right jugular central line is noted.  No pneumothorax is seen.  Catheter tip is noted at the cavoatrial junction.  The lungs are clear.  Cardiac shadow is stable.  IMPRESSION: No evidence of pneumothorax following central line placement.   Original Report Authenticated By: Alcide Clever, M.D.   Dg Chest Port 1 View  06/28/2013   *RADIOLOGY REPORT*  Clinical Data: Seizures, suspected aspiration event  PORTABLE CHEST - 1 VIEW  Comparison: Prior chest x-ray 06/25/2013  Findings: Single frontal view of the chest demonstrates no significant new airspace disease.  Inspiratory volumes are slightly lower and there may be minimal patchy opacity in the right base. Cardiac and mediastinal contours remain  within normal limits. Atherosclerotic calcifications noted in the transverse aorta. Chronic central bronchitic changes are similar compared to prior. No pneumothorax.  No acute osseous abnormality.  IMPRESSION:  Slightly lower inspiratory volumes with trace right basilar opacity which may reflect atelectasis or minimal infiltrate.   Original Report Authenticated By: Malachy Moan, M.D.   Dg Chest Port 1 View  06/25/2013   CLINICAL DATA:  Fever.  EXAM: PORTABLE CHEST - 1 VIEW  COMPARISON:  12/11/2012.  FINDINGS: Normal sized heart. Clear lungs. The lungs remain hyperexpanded. Unremarkable bones.  IMPRESSION: No acute abnormality. Stable changes of COPD.   Electronically Signed   By: Gordan Payment   On: 06/25/2013 19:02    PHYSICAL EXAM  patient is oriented to person time and place. Affect is normal. She thin and frail but stable. Lungs are clear. Cardiac exam reveals S1 and S2. There is no peripheral edema.   TELEMETRY: I  have reviewed telemetry today July 04, 2013. There is normal sinus rhythm with PACs. The rate is approximately 95.   ASSESSMENT AND PLAN:    Hypokalemia   Hypomagnesemia    This is being managed by Dr. Joseph Art.    Tachycardia      The rhythm is now sinus. We will plan to follow this.    Hypophosphatemia    Willa Rough 07/04/2013 8:54 AM

## 2013-07-04 NOTE — Progress Notes (Signed)
Physical Therapy Treatment Patient Details Name: [name] [name] MRN: [id number] DOB: 1941/03/20 Today's Date: 07/04/2013 Time: 7846-9629 PT Time Calculation (min): 26 min  PT Assessment / Plan / Recommendation  History of Present Illness 72 y.o. female with a hx of previous intracranial hemorrhage, seizures, htn, and diabetes who presented to the ED with recurrent seizures.   PT Comments   Pt demonstrates decr activity tolerance this session. See BP below. Pt eager to participate and willing to try all task therapist request. Pt remains high fall risk at this time. Pt with decr awareness to vital changes.  Orthostatic BPs  Supine 114/61  Sitting   Sitting after 1.5 min 129/49  Standing   Standing after 1.5 min 122/46     Follow Up Recommendations  CIR     Does the patient have the potential to tolerate intense rehabilitation     Barriers to Discharge        Equipment Recommendations  None recommended by PT    Recommendations for Other Services    Frequency Min 3X/week   Progress towards PT Goals Progress towards PT goals: Progressing toward goals  Plan Current plan remains appropriate    Precautions / Restrictions Precautions Precautions: Fall Restrictions Weight Bearing Restrictions: No   Pertinent Vitals/Pain     Mobility  Bed Mobility Supine to Sit: 5: Supervision;HOB flat Sitting - Scoot to Edge of Bed: 5: Supervision Sit to Supine: 5: Supervision;HOB flat Transfers Sit to Stand: 4: Min assist;With upper extremity assist;From bed Stand to Sit: 4: Min assist;With upper extremity assist;To toilet Details for Transfer Assistance: cues for safe hand placement Ambulation/Gait Ambulation/Gait Assistance: 4: Min assist Ambulation Distance (Feet): 18 Feet (times 2  (to/from bathroom)) Assistive device: 1 person hand held assist Ambulation/Gait Assistance Details: gait unsteady with tendency to list forward or to the Left Gait Pattern: Decreased stride length;Narrow  base of support;Shuffle;Trunk flexed Stairs: No Wheelchair Mobility Wheelchair Mobility: No    Exercises     PT Diagnosis:    PT Problem List:   PT Treatment Interventions:     PT Goals (current goals can now be found in the care plan section) Acute Rehab PT Goals Patient Stated Goal: Pt unable to state. Husband would like pt to come back home. Time For Goal Achievement: 07/05/13 Potential to Achieve Goals: Good  Visit Information  Assistance Needed: +1 (+ 2 safety) History of Present Illness: 72 y.o. female with a hx of previous intracranial hemorrhage, seizures, htn, and diabetes who presented to the ED with recurrent seizures.    Subjective Data  Patient Stated Goal: Pt unable to state. Husband would like pt to come back home.   Cognition  Cognition Arousal/Alertness: Awake/alert Behavior During Therapy: WFL for tasks assessed/performed Overall Cognitive Status: Impaired/Different from baseline Area of Impairment: Safety/judgement;Awareness Safety/Judgement: Decreased awareness of safety;Decreased awareness of deficits Awareness: Emergent;Anticipatory Problem Solving: Slow processing    Balance  Balance Balance Assessed: Yes Static Standing Balance Static Standing - Balance Support: Left upper extremity supported Static Standing - Level of Assistance: 5: Stand by assistance Static Standing - Comment/# of Minutes: 2 min  during check of BP  End of Session PT - End of Session Activity Tolerance: Patient tolerated treatment well;Other (comment) (limited by rapid HR and BP's trending down) Patient left: in bed;with call bell/phone within reach;with family/visitor present Nurse Communication: Mobility status   GP     Zeric Baranowski, Eliseo Gum 07/04/2013, 4:07 PM 07/04/2013  Rangely Bing, PT [phone]  (pager)

## 2013-07-04 NOTE — Progress Notes (Signed)
[facility] PROGRESS NOTE  [name] [name] AVW:[id number] DOB: 12-Oct-1941 DOA: 06/25/2013 PCP: Aida Puffer, MD  Brief narrative:  72 y.o. female with a hx of previous intracranial hemorrhage, seizures, HTN  and diabetes who presented to the ED with recurrent seizures. She presented to the ED on 8/20 with complaints of a headache lasting several days. This was associated with a left visual field cut. Neurology was consulted, and per recs, the patient underwent an MRI of the brain which was unremarkable. The patient was discharged home from the ED and treated symptomatically for her headache. On the morning of admission, ( 8/23) , the patient had a witnessed seizure. EMS was called. Upon EMS arrival, the patient was alert/oriented and the family refused transport. The pt then had another seizure and was transported to the ED. In the ED, the pt had another witnessed seizure lasting about 45sec. A CT head was done, which redemonstrated the known remote hmorrhagic infarct, without acute changes. Neurology was consulted and admitted to step down unit. 06/29/2013 patient sleepy, but arousable answers questions appropriately. Husband at bedside stated that a week prior to hospitalization episodes of passing out in the bathroom, and would be confused when she regained consciousness (sounds postictal). States prior to hospitalization last generalized seizure 10 months ago in October 2013. States yesterday again had an episode of passing out while in the bathroom (was not having BM) husband unsure whether she was more confused after she regained consciousness. Patient states currently no headache 06/30/2013 husband and nurse report that patient has had several incidences of what appears to be LOC while using the restroom (each time has been urination). At the time of LOC patient was hypotensive, today's episode patient was BP=98/62, HR= 102. After approximately 5 minutes patient came out of episode, negative postictal  signs or symptoms. 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 Continue Keppra 500 mg by mouth twice a day discharge home. NOTE /OT both recommend CIR vs SNF.   -- ReConsult for inpatient neuro rehabilitation placed. Believe Pt would do better w/ a course of Inpatient Rehab   Fever/leukocytosis Present on admission. Concern for aspiration pneumonia. The levofloxacin day 6/7. Continued high WBC. Aspiration pneumonia vs Demargination from seizures --Counseled patient and family WBC have dec 9.4 now within normal limits -- patient feeling better clinically will obtain pan culture, chest x-ray   DM type 2  CBGs are still mildly high however has also had some low readings, therefore continue Levemir 8 units each bedtime, SSI to moderate scale.  --06/29/2013 hemoglobin A1c= 8.6.  --06/29/2013 lipid panel within ADA guideline  HTN  --Patient's BP running high however no further episodes of micturition mediated syncope. Continue to hold all BP medication  CKD stage 3, GFR 30-59 ml/min  Renal fn stable  Diffuse T wave changes  Noted on EKG in ED. 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 Recommend regular diet   Thrush  IV Diflucan completed 7 days treatment   Anxiety/agitation; when necessary Ativan   Syncope (micturition mediated)  EKG, along with consult from cardiology and neurology do not support Autonomic dysfunction. Patient appears to have episodes of micturition mediated  hypo-tension.  --- Continue to hold all BP medication --- Ensure placement of a compression stockings   Hypokalemia; Patient most likely suffering  from refeeding syndrome --Replete potassium.     Hypomagnesemia; Patient most likely suffering from refeeding syndrome   --Replete magnesium  Refeeding syndrome --Obtain potassium, magnesium, phosphorus levels daily   Hypophosphatemia; Patient most likely suffering from refeeding syndrome   --Continue to aggressively replete  Severe malnutrition; his pre-albumin= 6.9 (reference range 17-34mg /dL) --Patient now increased consumption which is resulting in depletion of her electrolytes --Consult nutrition for appropriate balanced nutrition for correction of severe malnutrition  Code Status: FULL  Family Communication: Spoke with husband concerning plan of care Done at bedside Disposition Plan: Physical therapy decrements CIR. Seen by CIR and recommend  home health  Consultants:  Neurology   Procedures:  8/24 - LP per Neuro  CSF culture negative CSF Gram stain Negative CSF cell count negative CSF glucose high   Procedure CT head without contrast 06/22/2013 IMPRESSION:  1. No acute intracranial findings.  2. Known remote hemorrhagic infarction in the right posteromedial  temporal lobe.  3. Periventricular white matter and corona radiata hypodensities  favor chronic ischemic microvascular white matter disease.  MRI brain without contrast 06/22/2013 IMPRESSION:  No acute infarction. Moderate chronic small vessel changes of the  cerebral hemispheric white matter. Old hemorrhagic infarction in  the right posteromedial temporal lobe that could effect the optic  radiations on the right.    Antibiotics:  Ampicillin 8/23 >> 8/24  Rocephin 8/23 >> 8/24  Vancomycin 8/23 >> 8/24  Levaquin 8/23, day 6/7 IV Diflucan 8/24-8/30, day 7/7   DVT prophylaxis:  SCDs   PCXR 07/02/2013 S/P placement of right IJ central line  Findings: A new right jugular central line is noted. No  pneumothorax is seen. Catheter tip is noted at the cavoatrial  junction. The lungs are clear. Cardiac  shadow is stable.  IMPRESSION:  No evidence of pneumothorax following central line placement     Filed Vitals:   07/04/13 1811  BP: 129/47  Pulse: 119  Temp: 98.7 F (37.1 C)  Resp: 20    Intake/Output Summary (Last 24 hours) at 07/04/13 2115 Last data filed at 07/04/13 1813  Gross per 24 hour  Intake   2665 ml  Output   1550 ml  Net   1115 ml   Filed Weights   06/25/13 1315 06/26/13 0438 07/04/13 0500  Weight: 45.8 kg (100 lb 15.5 oz) 50.1 kg (110 lb 7.2 oz) 52.028 kg (114 lb 11.2 oz)    Exam:   General: Alert,NAD    Cardiovascular; regular rhythm tachycardic, negative murmurs rubs gallops, DP/PT pulse 1+ bilateral  Chest: Clear to auscultation bilaterally  Abdomen: Soft, nontender, nondistended, bowel sounds present  Extremities: Warm, no edema  CNS: Alert and oriented x4 (following LOC), cranial nerves II through XII intact    Data Reviewed: Basic Metabolic Panel:  Recent Labs Lab 06/30/13 1205 07/01/13 0500  07/01/13 2009 07/02/13 0506 07/02/13 1400 07/03/13 0450 07/03/13 1725 07/04/13 0500  NA 133* 145  --   --  143  --  142  --  141  K 3.1* 2.4*  < > 2.9* 2.9* 3.0* 2.5* 4.1 3.6  CL 96 105  --   --  101  --  99  --  99  CO2 29 28  --   --  31  --  34*  --  30  GLUCOSE 240* 60*  --   --  104*  --  60*  --  106*  BUN 12 8  --   --  8  --  12  --  18  CREATININE 1.15* 0.97  --   --  0.96  --  1.00  --  0.99  CALCIUM 7.2* 7.7*  --   --  7.8*  --  7.5*  --  7.9*  MG 0.7*  --   < > 1.7 1.5  --  1.1* 4.5* 2.2  PHOS  --   --   --  0.8*  --   --  3.3  --  4.3  4.4  < > = values in this interval not displayed. Liver Function Tests:  Recent Labs Lab 06/30/13 1205 07/01/13 0500 07/02/13 0506 07/03/13 0450 07/04/13 0500  AST 34 32 29 23 30   ALT 19 19 18 16 18   ALKPHOS 74 76 91 88 89  BILITOT 0.5 0.5 0.5 0.5 0.3  PROT 5.5* 6.1 6.1 5.7* 5.9*  ALBUMIN 2.4* 2.6* 2.2* 2.2* 2.1*   No results found for this basename: LIPASE, AMYLASE,  in the last  168 hours No results found for this basename: AMMONIA,  in the last 168 hours CBC:  Recent Labs Lab 06/30/13 0705 07/01/13 0500 07/02/13 0506 07/03/13 0450 07/04/13 0500  WBC 15.6* 18.4* 12.8* 11.1* 9.4  NEUTROABS 11.7* 13.6* 9.7* 6.4 6.4  HGB 10.2* 10.8* 10.6* 9.5* 9.2*  HCT 29.9* 31.8* 31.4* 29.1* 27.5*  MCV 93.4 94.4 94.6 96.0 96.2  PLT 176 239 219 235 257   Cardiac Enzymes:  Recent Labs Lab 06/28/13 0508  TROPONINI <0.30   BNP (last 3 results) No results found for this basename: PROBNP,  in the last 8760 hours CBG:  Recent Labs Lab 07/04/13 0329 07/04/13 0740 07/04/13 1141 07/04/13 1649 07/04/13 2011  GLUCAP 109* 94 290* 151* 293*    Recent Results (from the past 240 hour(s))  MRSA PCR SCREENING     Status: None   Collection Time    06/25/13  1:25 PM      Result Value Range Status   MRSA by PCR NEGATIVE  NEGATIVE Final   Comment:            The GeneXpert MRSA Assay (FDA     approved for NASAL specimens     only), is one component of a     comprehensive MRSA colonization     surveillance program. It is not     intended to diagnose MRSA     infection nor to guide or     monitor treatment for     MRSA infections.  CULTURE, BLOOD (ROUTINE X 2)     Status: None   Collection Time    06/25/13  4:15 PM      Result Value Range Status   Specimen Description BLOOD RIGHT ARM   Final   Special Requests     Final   Value: BOTTLES DRAWN AEROBIC AND ANAEROBIC 10CC BLUE,5CC RED   Culture  Setup Time     Final   Value: 06/25/2013 22:13     Performed at [facility]   Culture     Final   Value: NO GROWTH 5 DAYS     Performed at [facility]   Report Status 07/01/2013 FINAL   Final  CULTURE, BLOOD (ROUTINE X 2)     Status: None   Collection Time    06/25/13  4:30 PM      Result Value  Range Status   Specimen Description BLOOD RIGHT HAND   Final   Special Requests BOTTLES DRAWN AEROBIC ONLY 10CC   Final   Culture  Setup Time     Final   Value:  06/25/2013 22:13     Performed at [facility]   Culture     Final   Value: NO GROWTH 5 DAYS     Performed at [facility]   Report Status 07/01/2013 FINAL   Final  GRAM STAIN     Status: None   Collection Time    06/25/13  7:58 PM      Result Value Range Status   Specimen Description CSF   Final   Special Requests TUBE 2   Final   Gram Stain     Final   Value: CYTOSPIN SLIDE     WBC PRESENT, PREDOMINANTLY MONONUCLEAR     NO ORGANISMS SEEN   Report Status 06/25/2013 FINAL   Final  CSF CULTURE     Status: None   Collection Time    06/25/13  7:58 PM      Result Value Range Status   Specimen Description CSF   Final   Special Requests TUBE 2   Final   Gram Stain     Final   Value: WBC PRESENT, PREDOMINANTLY MONONUCLEAR     NO ORGANISMS SEEN     CYTOSPIN Performed at [facility]     Performed at [facility]   Culture     Final   Value: NO GROWTH 3 DAYS     Performed at [facility]   Report Status 06/29/2013 FINAL   Final  CULTURE, BLOOD (ROUTINE X 2)     Status: None   Collection Time    07/01/13  8:45 AM      Result Value Range Status   Specimen Description BLOOD RIGHT ARM   Final   Special Requests BOTTLES DRAWN AEROBIC AND ANAEROBIC 10 ML   Final   Culture  Setup Time     Final   Value: 07/01/2013 15:35     Performed at [facility]   Culture     Final   Value:        BLOOD CULTURE RECEIVED NO GROWTH TO DATE CULTURE WILL BE HELD FOR 5 DAYS BEFORE ISSUING A FINAL NEGATIVE REPORT     Performed at [facility]   Report Status PENDING   Incomplete  CULTURE, BLOOD (ROUTINE X 2)     Status: None   Collection Time    07/01/13  9:00 AM      Result Value Range Status   Specimen Description BLOOD LEFT ARM   Final   Special Requests BOTTLES DRAWN AEROBIC AND ANAEROBIC 10 ML   Final   Culture  Setup Time     Final   Value: 07/01/2013 15:35     Performed at [facility]   Culture     Final   Value:        BLOOD  CULTURE RECEIVED NO GROWTH TO DATE CULTURE WILL BE HELD FOR 5 DAYS BEFORE ISSUING A FINAL NEGATIVE REPORT     Performed at [facility]   Report Status PENDING   Incomplete  URINE CULTURE     Status: None   Collection Time    07/01/13  5:44 PM      Result Value Range Status   Specimen Description URINE, RANDOM   Final  Special Requests NONE   Final   Culture  Setup Time     Final   Value: 07/01/2013 18:07     Performed at [facility] Count     Final   Value: NO GROWTH     Performed at [facility]   Culture     Final   Value: NO GROWTH     Performed at [facility]   Report Status 07/02/2013 FINAL   Final     Studies: No results found.  Scheduled Meds: . diltiazem  30 mg Oral Q6H  . insulin aspart  0-15 Units Subcutaneous Q4H  . insulin detemir  8 Units Subcutaneous QHS  . levETIRAcetam  500 mg Oral BID  . phosphorus  500 mg Oral QID  . sodium chloride  3 mL Intravenous Q12H   Continuous Infusions: . sodium chloride 125 mL/hr at 06/30/13 1307  . sodium chloride 75 mL/hr at 07/04/13 1800      Time spent: 50 minutes    Vivian Okelley, Roselind Messier  [facility] Pager [phone] If 7PM-7AM, please contact night-coverage at [email or web address], password [facility] 07/04/2013, 9:15 PM  LOS: 9 days

## 2013-07-05 LAB — COMPREHENSIVE METABOLIC PANEL
AST: 25 U/L (ref 0–37)
Albumin: 2 g/dL — ABNORMAL LOW (ref 3.5–5.2)
Alkaline Phosphatase: 108 U/L (ref 39–117)
Chloride: 99 mEq/L (ref 96–112)
Potassium: 3.8 mEq/L (ref 3.5–5.1)
Sodium: 137 mEq/L (ref 135–145)
Total Bilirubin: 0.3 mg/dL (ref 0.3–1.2)

## 2013-07-05 LAB — MAGNESIUM
Magnesium: 1.9 mg/dL (ref 1.5–2.5)
Magnesium: 2 mg/dL (ref 1.5–2.5)

## 2013-07-05 LAB — GLUCOSE, CAPILLARY
Glucose-Capillary: 118 mg/dL — ABNORMAL HIGH (ref 70–99)
Glucose-Capillary: 159 mg/dL — ABNORMAL HIGH (ref 70–99)
Glucose-Capillary: 219 mg/dL — ABNORMAL HIGH (ref 70–99)
Glucose-Capillary: 272 mg/dL — ABNORMAL HIGH (ref 70–99)
Glucose-Capillary: 304 mg/dL — ABNORMAL HIGH (ref 70–99)
Glucose-Capillary: 328 mg/dL — ABNORMAL HIGH (ref 70–99)
Glucose-Capillary: 39 mg/dL — CL (ref 70–99)

## 2013-07-05 LAB — CBC WITH DIFFERENTIAL/PLATELET
Basophils Absolute: 0 10*3/uL (ref 0.0–0.1)
Eosinophils Absolute: 0.1 10*3/uL (ref 0.0–0.7)
Eosinophils Relative: 2 % (ref 0–5)
Lymphs Abs: 1.5 10*3/uL (ref 0.7–4.0)
MCH: 31.6 pg (ref 26.0–34.0)
MCV: 96.7 fL (ref 78.0–100.0)
Monocytes Absolute: 1 10*3/uL (ref 0.1–1.0)
Platelets: 264 10*3/uL (ref 150–400)
RDW: 13 % (ref 11.5–15.5)

## 2013-07-05 LAB — PHOSPHORUS: Phosphorus: 4 mg/dL (ref 2.3–4.6)

## 2013-07-05 MED ORDER — INSULIN DETEMIR 100 UNIT/ML ~~LOC~~ SOLN
12.0000 [IU] | Freq: Every morning | SUBCUTANEOUS | Status: DC
  Administered 2013-07-06: 12 [IU] via SUBCUTANEOUS
  Filled 2013-07-05: qty 0.12

## 2013-07-05 MED ORDER — INSULIN ASPART 100 UNIT/ML ~~LOC~~ SOLN
0.0000 [IU] | SUBCUTANEOUS | Status: DC
  Administered 2013-07-05: 3 [IU] via SUBCUTANEOUS
  Administered 2013-07-05: 5 [IU] via SUBCUTANEOUS
  Administered 2013-07-05 – 2013-07-06 (×2): 3 [IU] via SUBCUTANEOUS
  Administered 2013-07-06: 5 [IU] via SUBCUTANEOUS

## 2013-07-05 MED ORDER — INSULIN ASPART 100 UNIT/ML ~~LOC~~ SOLN: 0.0000 [IU] | Freq: Three times a day (TID) | SUBCUTANEOUS | Status: DC

## 2013-07-05 NOTE — Progress Notes (Signed)
[facility] PROGRESS NOTE  [name] [name] ZOX:[id number] DOB: 12-Sep-1941 DOA: 06/25/2013 PCP: Aida Puffer, MD  Brief narrative:  72 y.o. female with a hx of previous intracranial hemorrhage, seizures, HTN  and diabetes who presented to the ED with recurrent seizures. She presented to the ED on 8/20 with complaints of a headache lasting several days. This was associated with a left visual field cut. Neurology was consulted, and per recs, the patient underwent an MRI of the brain which was unremarkable. The patient was discharged home from the ED and treated symptomatically for her headache. On the morning of admission, ( 8/23) , the patient had a witnessed seizure. EMS was called. Upon EMS arrival, the patient was alert/oriented and the family refused transport. The pt then had another seizure and was transported to the ED. In the ED, the pt had another witnessed seizure lasting about 45sec. A CT head was done, which redemonstrated the known remote hmorrhagic infarct, without acute changes. Neurology was consulted and admitted to step down unit. 06/29/2013 patient sleepy, but arousable answers questions appropriately. Husband at bedside stated that a week prior to hospitalization episodes of passing out in the bathroom, and would be confused when she regained consciousness (sounds postictal). States prior to hospitalization last generalized seizure 10 months ago in October 2013. States yesterday again had an episode of passing out while in the bathroom (was not having BM) husband unsure whether she was more confused after she regained consciousness. Patient states currently no headache 06/30/2013 husband and nurse report that patient has had several incidences of what appears to be LOC while using the restroom (each time has been urination). At the time of LOC patient was hypotensive, today's episode patient was BP=98/62, HR= 102. After approximately 5 minutes patient came out of episode, negative postictal  signs or symptoms. 07/01/2013 feels much better, and is eating well ( hamburger french fries), only complaint is some soreness around her left gluteus maximus/ iliac crest which she attributes to sleeping in the hospital bed TODAY doing well has been eating interacting much better with medical staff. 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 Continue Keppra 500 mg by mouth twice a day discharge home. NOTE /OT both recommend CIR vs SNF.   -- ReConsult for inpatient neuro rehabilitation placed. Believe Pt would do better w/ a course of Inpatient Rehab --We'll have physical therapy work with patient daily for deconditioning   Fever/leukocytosis Present on admission. Concern for aspiration pneumonia. The levofloxacin day 6/7. Continued high WBC. Aspiration pneumonia vs Demargination from seizures --Counseled patient and family WBC have dec 9.1 now within normal limits -- patient feeling better clinically will obtain pan culture, chest x-ray   DM type 2  CBGs are still mildly high however has also had some low readings, therefore continue Levemir 8 units each bedtime, SSI to moderate scale.  --06/29/2013 hemoglobin A1c= 8.6.  --06/29/2013 lipid panel within ADA guideline  HTN  --Patient's BP running high however no further episodes of micturition mediated syncope. Continue to hold all BP medication except Cardizem  CKD stage 3, GFR 30-59 ml/min  Renal fn stable  Diffuse T wave changes  Noted on EKG in ED. 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 Recommend regular diet   Thrush  IV Diflucan completed 7 days treatment   Anxiety/agitation; when necessary Ativan   Syncope (micturition mediated)  EKG, along  with consult from cardiology and neurology do not support Autonomic dysfunction. Patient appears to have episodes of micturition mediated hypo-tension.  ---  Continue to hold all BP medication, except Cardizem --- Ensure placement of a compression stockings   Hypokalemia; Patient most likely suffering from refeeding syndrome --Replete potassium. Currently within normal limits    Hypomagnesemia; Patient most likely suffering from refeeding syndrome   --Replete magnesium; currently within normal limits  Refeeding syndrome --Obtain potassium, magnesium, phosphorus levels daily   Hypophosphatemia; Patient most likely suffering from refeeding syndrome   --Continue to aggressively replete;  currently within normal limits  Severe malnutrition; his pre-albumin= 6.9 (reference range 17-34mg /dL) --Patient now increased consumption which is resulting in depletion of her electrolytes --Consult nutrition for appropriate balanced nutrition for correction of severe malnutrition  Code Status: FULL  Family Communication: Spoke with husband concerning plan of care Done at bedside Disposition Plan: Physical therapy decrements CIR. Seen by CIR and recommend  home health  Consultants:  Neurology     Procedures:  8/24 - LP per Neuro  CSF culture negative CSF Gram stain Negative CSF cell count negative CSF glucose high   Procedure CT head without contrast 06/22/2013 IMPRESSION:  1. No acute intracranial findings.  2. Known remote hemorrhagic infarction in the right posteromedial  temporal lobe.  3. Periventricular white matter and corona radiata hypodensities  favor chronic ischemic microvascular white matter disease.  MRI brain without contrast 06/22/2013 IMPRESSION:  No acute infarction. Moderate chronic small vessel changes of the  cerebral hemispheric white matter. Old hemorrhagic infarction in  the right posteromedial temporal lobe that could effect the optic  radiations on the right.    Antibiotics:  Ampicillin 8/23 >> 8/24  Rocephin 8/23 >> 8/24  Vancomycin 8/23 >> 8/24  Levaquin 8/23, day 6/7 IV Diflucan 8/24-8/30, day 7/7    DVT prophylaxis:  SCDs   PCXR 07/02/2013 S/P placement of right IJ central line  Findings: A new right jugular central line is noted. No  pneumothorax is seen. Catheter tip is noted at the cavoatrial  junction. The lungs are clear. Cardiac shadow is stable.  IMPRESSION:  No evidence of pneumothorax following central line placement  Urine culture 07/01/2013; negative Blood culture x2 on 07/01/2013; negative      Filed Vitals:   07/05/13 0644  BP: 139/50  Pulse: 99  Temp: 98 F (36.7 C)  Resp: 19    Intake/Output Summary (Last 24 hours) at 07/05/13 0649 Last data filed at 07/04/13 2100  Gross per 24 hour  Intake   2475 ml  Output   1700 ml  Net    775 ml   Filed Weights   06/26/13 0438 07/04/13 0500 07/05/13 0500  Weight: 50.1 kg (110 lb 7.2 oz) 52.028 kg (114 lb 11.2 oz) 52.118 kg (114 lb 14.4 oz)    Exam:   General: Alert,NAD    Cardiovascular; regular rhythm tachycardic, negative murmurs rubs gallops, DP/PT pulse 1+ bilateral  Chest: Clear to auscultation bilaterally  Abdomen: Soft, nontender, nondistended, bowel sounds present  Extremities: Warm, no edema  CNS: Alert and oriented x4 (following LOC), cranial nerves II through XII intact    Data Reviewed: Basic Metabolic Panel:  Recent Labs Lab 07/01/13 0500  07/01/13 2009 07/02/13 0506  07/03/13 0450 07/03/13 1725 07/04/13 0500 07/04/13 2340 07/05/13 0515  NA 145  --   --  143  --  142  --  141  --  137  K 2.4*  < > 2.9* 2.9*  < >  2.5* 4.1 3.6 4.0 3.8  CL 105  --   --  101  --  99  --  99  --  99  CO2 28  --   --  31  --  34*  --  30  --  30  GLUCOSE 60*  --   --  104*  --  60*  --  106*  --  343*  BUN 8  --   --  8  --  12  --  18  --  19  CREATININE 0.97  --   --  0.96  --  1.00  --  0.99  --  1.05  CALCIUM 7.7*  --   --  7.8*  --  7.5*  --  7.9*  --  7.9*  MG  --   < > 1.7 1.5  --  1.1* 4.5* 2.2 1.9 2.0  PHOS  --   --  0.8*  --   --  3.3  --  4.3  4.4 4.0 4.8*  < > = values in this  interval not displayed. Liver Function Tests:  Recent Labs Lab 07/01/13 0500 07/02/13 0506 07/03/13 0450 07/04/13 0500 07/05/13 0515  AST 32 29 23 30 25   ALT 19 18 16 18 17   ALKPHOS 76 91 88 89 108  BILITOT 0.5 0.5 0.5 0.3 0.3  PROT 6.1 6.1 5.7* 5.9* 5.6*  ALBUMIN 2.6* 2.2* 2.2* 2.1* 2.0*   No results found for this basename: LIPASE, AMYLASE,  in the last 168 hours No results found for this basename: AMMONIA,  in the last 168 hours CBC:  Recent Labs Lab 07/01/13 0500 07/02/13 0506 07/03/13 0450 07/04/13 0500 07/05/13 0515  WBC 18.4* 12.8* 11.1* 9.4 9.1  NEUTROABS 13.6* 9.7* 6.4 6.4 6.5  HGB 10.8* 10.6* 9.5* 9.2* 8.7*  HCT 31.8* 31.4* 29.1* 27.5* 26.6*  MCV 94.4 94.6 96.0 96.2 96.7  PLT 239 219 235 257 264   Cardiac Enzymes: No results found for this basename: CKTOTAL, CKMB, CKMBINDEX, TROPONINI,  in the last 168 hours BNP (last 3 results) No results found for this basename: PROBNP,  in the last 8760 hours CBG:  Recent Labs Lab 07/04/13 2359 07/05/13 0225 07/05/13 0246 07/05/13 0419 07/05/13 0504  GLUCAP 159* 39* 118* 304* 328*    Recent Results (from the past 240 hour(s))  MRSA PCR SCREENING     Status: None   Collection Time    06/25/13  1:25 PM      Result Value Range Status   MRSA by PCR NEGATIVE  NEGATIVE Final   Comment:            The GeneXpert MRSA Assay (FDA     approved for NASAL specimens     only), is one component of a     comprehensive MRSA colonization     surveillance program. It is not     intended to diagnose MRSA     infection nor to guide or     monitor treatment for     MRSA infections.  CULTURE, BLOOD (ROUTINE X 2)     Status: None   Collection Time    06/25/13  4:15 PM      Result Value Range Status   Specimen Description BLOOD RIGHT ARM   Final   Special Requests     Final   Value: BOTTLES DRAWN AEROBIC AND ANAEROBIC 10CC BLUE,5CC RED   Culture  Setup Time     Final  Value: 06/25/2013 22:13     Performed at [facility]   Culture     Final   Value: NO GROWTH 5 DAYS     Performed at [facility]   Report Status 07/01/2013 FINAL   Final  CULTURE, BLOOD (ROUTINE X 2)     Status: None   Collection Time    06/25/13  4:30 PM      Result Value Range Status   Specimen Description BLOOD RIGHT HAND   Final   Special Requests BOTTLES DRAWN AEROBIC ONLY 10CC   Final   Culture  Setup Time     Final   Value: 06/25/2013 22:13     Performed at [facility]   Culture     Final   Value: NO GROWTH 5 DAYS     Performed at [facility]   Report Status 07/01/2013 FINAL   Final  GRAM STAIN     Status: None   Collection Time    06/25/13  7:58 PM      Result Value Range Status   Specimen Description CSF   Final   Special Requests TUBE 2   Final   Gram Stain     Final   Value: CYTOSPIN SLIDE     WBC PRESENT, PREDOMINANTLY MONONUCLEAR     NO ORGANISMS SEEN   Report Status 06/25/2013 FINAL   Final  CSF CULTURE     Status: None   Collection Time    06/25/13  7:58 PM      Result Value Range Status   Specimen Description CSF   Final   Special Requests TUBE 2   Final   Gram Stain     Final   Value: WBC PRESENT, PREDOMINANTLY MONONUCLEAR     NO ORGANISMS SEEN     CYTOSPIN Performed at [facility]     Performed at [facility]   Culture     Final   Value: NO GROWTH 3 DAYS     Performed at [facility]   Report Status 06/29/2013 FINAL   Final  CULTURE, BLOOD (ROUTINE X 2)     Status: None   Collection Time    07/01/13  8:45 AM      Result Value Range Status   Specimen Description BLOOD RIGHT ARM   Final   Special Requests BOTTLES DRAWN AEROBIC AND ANAEROBIC 10 ML   Final   Culture  Setup Time     Final   Value: 07/01/2013 15:35     Performed at [facility]   Culture     Final   Value:        BLOOD CULTURE RECEIVED NO GROWTH TO DATE CULTURE WILL BE HELD FOR 5 DAYS BEFORE ISSUING A FINAL NEGATIVE REPORT     Performed at [facility]    Report Status PENDING   Incomplete  CULTURE, BLOOD (ROUTINE X 2)     Status: None   Collection Time    07/01/13  9:00 AM      Result Value Range Status   Specimen Description BLOOD LEFT ARM   Final   Special Requests BOTTLES DRAWN AEROBIC AND ANAEROBIC 10 ML   Final   Culture  Setup Time     Final   Value: 07/01/2013 15:35     Performed at [facility]   Culture     Final   Value:        BLOOD  CULTURE RECEIVED NO GROWTH TO DATE CULTURE WILL BE HELD FOR 5 DAYS BEFORE ISSUING A FINAL NEGATIVE REPORT     Performed at [facility]   Report Status PENDING   Incomplete  URINE CULTURE     Status: None   Collection Time    07/01/13  5:44 PM      Result Value Range Status   Specimen Description URINE, RANDOM   Final   Special Requests NONE   Final   Culture  Setup Time     Final   Value: 07/01/2013 18:07     Performed at [facility] Count     Final   Value: NO GROWTH     Performed at [facility]   Culture     Final   Value: NO GROWTH     Performed at [facility]   Report Status 07/02/2013 FINAL   Final     Studies: No results found.  Scheduled Meds: . diltiazem  30 mg Oral Q6H  . insulin aspart  0-9 Units Subcutaneous TID WC  . insulin detemir  8 Units Subcutaneous QHS  . levETIRAcetam  500 mg Oral BID  . phosphorus  500 mg Oral QID  . sodium chloride  3 mL Intravenous Q12H   Continuous Infusions: . sodium chloride 125 mL/hr at 06/30/13 1307  . sodium chloride 75 mL/hr at 07/04/13 1800      Time spent: 30 minutes    Kendalyn Cranfield, Roselind Messier  [facility] Pager [phone] If 7PM-7AM, please contact night-coverage at [email or web address], password [facility] 07/05/2013, 6:49 AM  LOS: 10 days

## 2013-07-05 NOTE — Progress Notes (Addendum)
Inpatient Diabetes Program Recommendations  AACE/ADA: New Consensus Statement on Inpatient Glycemic Control (2013)  Target Ranges:  Prepandial:   less than 140 mg/dL      Peak postprandial:   less than 180 mg/dL (1-2 hours)      Critically ill patients:  140 - 180 mg/dL   Labile cbg's from high glucose with high correction doses to resultant hypoglycemia Pt had low glucose of 39 mg/dL this am after several doses of correction (q 4 hrs)   Inpatient Diabetes Program Recommendations Insulin - Basal: xxxx Correction (SSI): Please change correction frequency to tidwc and add the HS scale HgbA1C: xxxxx  Correction tidwc (sensitive scale) and HS scale. Pt may also benefit from meal coverage of 3 units tidwc to potentially prevent need for high correction doses.  Thank you, Lenor Coffin, RN, CNS, Diabetes Coordinator [phone])

## 2013-07-05 NOTE — Progress Notes (Signed)
Patient Name: [name] [name]      SUBJECTIVE:  \ She is doing well.  She has had MAT - secondary to her underlying medical issues. She feels better now that her electrolytes are better.  Her Albumin is still low - she admits that she has not been eating well.     Past Medical History  Diagnosis Date  . Diabetes mellitus   . GERD (gastroesophageal reflux disease)   . Seizures   . Cataract   . Anxiety   . Stroke 2013    Secondary to acute right posterior temporo-occipital intra-axial hemorrhage    Scheduled Meds:  Scheduled Meds: . diltiazem  30 mg Oral Q6H  . insulin aspart  0-9 Units Subcutaneous Q4H  . [START ON 07/06/2013] insulin detemir  12 Units Subcutaneous q morning - 10a  . levETIRAcetam  500 mg Oral BID  . phosphorus  500 mg Oral QID  . sodium chloride  3 mL Intravenous Q12H   Continuous Infusions: . sodium chloride 125 mL/hr at 06/30/13 1307  . sodium chloride 75 mL/hr at 07/04/13 1800    PHYSICAL EXAM Filed Vitals:   07/05/13 0221 07/05/13 0225 07/05/13 0500 07/05/13 0644  BP: 175/57 157/68  139/50  Pulse: 122   99  Temp: 97.9 F (36.6 C)   98 F (36.7 C)  TempSrc: Oral   Oral  Resp: 20   19  Height:      Weight:   114 lb 14.4 oz (52.118 kg)   SpO2: 97%   94%   Thin frail woman lying flat in bed in NAD Neck JVP flat Carotids 1+ bialterally Cor :  Reg. Rate, slightly tachycardic. Lungs: clear Ab: soft NT/ND No edema or cyanosis Awake. Moves all 4 without difficulty   TELEMETRY:SR at 109 ( sinus tachy)    Intake/Output Summary (Last 24 hours) at 07/05/13 0937 Last data filed at 07/05/13 0751  Gross per 24 hour  Intake   2115 ml  Output   2450 ml  Net   -335 ml    LABS: Basic Metabolic Panel:  Recent Labs Lab 06/30/13 0705 06/30/13 1205 07/01/13 0500  07/02/13 0506 07/02/13 1400 07/03/13 0450 07/03/13 1725 07/04/13 0500 07/04/13 2340 07/05/13 0515  NA 133* 133* 145  --  143  --  142  --  141  --  137  K 2.3* 3.1* 2.4*  < >  2.9* 3.0* 2.5* 4.1 3.6 4.0 3.8  CL 95* 96 105  --  101  --  99  --  99  --  99  CO2 27 29 28   --  31  --  34*  --  30  --  30  GLUCOSE 112* 240* 60*  --  104*  --  60*  --  106*  --  343*  BUN 13 12 8   --  8  --  12  --  18  --  19  CREATININE 0.89 1.15* 0.97  --  0.96  --  1.00  --  0.99  --  1.05  CALCIUM 7.2* 7.2* 7.7*  --  7.8*  --  7.5*  --  7.9*  --  7.9*  MG  --  0.7*  --   < > 1.5  --  1.1* 4.5* 2.2 1.9 2.0  PHOS  --   --   --   < >  --   --  3.3  --  4.3  4.4 4.0 4.8*  < > =  values in this interval not displayed. Cardiac Enzymes: No results found for this basename: CKTOTAL, CKMB, CKMBINDEX, TROPONINI,  in the last 72 hours CBC:  Recent Labs Lab 06/29/13 0540 06/30/13 0705 07/01/13 0500 07/02/13 0506 07/03/13 0450 07/04/13 0500 07/05/13 0515  WBC 14.0* 15.6* 18.4* 12.8* 11.1* 9.4 9.1  NEUTROABS  --  11.7* 13.6* 9.7* 6.4 6.4 6.5  HGB 11.8* 10.2* 10.8* 10.6* 9.5* 9.2* 8.7*  HCT 35.2* 29.9* 31.8* 31.4* 29.1* 27.5* 26.6*  MCV 94.9 93.4 94.4 94.6 96.0 96.2 96.7  PLT 221 176 239 219 235 257 264   PROTIME: No results found for this basename: LABPROT, INR,  in the last 72 hours Liver Function Tests:  Recent Labs  07/04/13 0500 07/05/13 0515  AST 30 25  ALT 18 17  ALKPHOS 89 108  BILITOT 0.3 0.3  PROT 5.9* 5.6*  ALBUMIN 2.1* 2.0*   No results found for this basename: LIPASE, AMYLASE,  in the last 72 hours BNP: BNP (last 3 results) No results found for this basename: PROBNP,  in the last 8760 hours D-Dimer: No results found for this basename: DDIMER,  in the last 72 hours Hemoglobin A1C: No results found for this basename: HGBA1C,  in the last 72 hours Fasting Lipid Panel: No results found for this basename: CHOL, HDL, LDLCALC, TRIG, CHOLHDL, LDLDIRECT,  in the last 72 hours Thyroid Function Tests: No results found for this basename: TSH, T4TOTAL, FREET3, T3FREE, THYROIDAB,  in the last 72 hours   ASSESSMENT AND PLAN:  Principal Problem:   Seizure Active  Problems:   DM type 2 (diabetes mellitus, type 2)   HTN (hypertension)   CKD (chronic kidney disease) stage 3, GFR 30-59 ml/min   Hemorrhage in the brain-13 x 22 x 14 mm right posterior temporo-occipital intra-axial acute   Syncope   Protein-calorie malnutrition, severe   Leukocytosis, unspecified   Hypokalemia   Hypomagnesemia   Tachycardia   Hypophosphatemia   Encounter for central line placement  Her episode of syncope was likely due to her medical issues and orthostatic hypotension.    Will sign off.  Call for questions.   Signed, Elyn Aquas. MD  07/05/2013

## 2013-07-05 NOTE — Progress Notes (Signed)
Pt called this nurse into room stating "I'm burning up." Pt hot to touch, and pale in color. Vitals remain WNL. Blood glucose noted to be 39. Patient drinking grape juice and eating peanut butter crackers at this time. Orange juice also administered. Blood glucose rechecked and a reading of 118 received. Will continue to monitor blood glucose levels.

## 2013-07-05 NOTE — Progress Notes (Signed)
Pt w/ low activity tolerance/poor endurance.  Continuing to follow to see if CIR is at all a possibility, but, this seems unlikely.  [phone]

## 2013-07-05 NOTE — Progress Notes (Signed)
FOLLOW-UP/CONSULT  DOCUMENTATION CODES Per approved criteria  -Severe malnutrition in the context of chronic illness -Underweight   INTERVENTION: Add Magic cup TID between meals, each supplement provides 290 kcal and 9 grams of protein.  Mag, Phos, potassium have been WNL, expect now outside of refeeding window and daily monitoring of these can be decreased.  Recommend change diet back to carb mod medium to allow for greater food choices.  RD will continue to follow   NUTRITION DIAGNOSIS: Malnutrition related to inadequate oral intake as evidenced by severe muscle loss and intake </= 75% of estimated energy intake for >/= 1 month. Ongoing.   Goal: Intake to meet >90% of estimated nutrition needs. Improving.   Monitor:  PO intake, labs, weight trend.  ASSESSMENT: Patient was admitted on 8/23 with recurrent seizures. Patient was in the ED on 8/20 with a headache lasting several days; MRI of the brain was unremarkable at that time. CT of head on this admission showed known remote hemorrhagic infarct, without acute changes.Keppra was increased to 500 mg BID. S/P lumbar puncture 8/23; CSF was unremarkable with no signs of acute meningitis. Aspiration PNA suspected.    Pt Potassium and Magnesium now WNL, Phos slightly elevated. Pt likely now out side the window for further electrolyte imbalances. Will add Magic Cup back to diet to encourage weight gain.   Height: Ht Readings from Last 1 Encounters:  06/25/13 5\' 5"  (1.651 m)    Weight: Wt Readings from Last 1 Encounters:  07/05/13 114 lb 14.4 oz (52.118 kg)  06/25/13 100 lb (ADMISSION WEIGHT)  BMI:  Body mass index is 19.12 kg/(m^2). Underweight  Estimated Nutritional Needs: Kcal: 1400-1600 Protein: 70-80 gm Fluid: 1.4-1.6 L  Skin: no problems  Diet Order: Carb Control Medium    Intake/Output Summary (Last 24 hours) at 07/05/13 1039 Last data filed at 07/05/13 0952  Gross per 24 hour  Intake   2115 ml  Output   2250 ml   Net   -135 ml    Last BM: 8/27  Labs:   Recent Labs Lab 07/03/13 0450  07/04/13 0500 07/04/13 2340 07/05/13 0515  NA 142  --  141  --  137  K 2.5*  < > 3.6 4.0 3.8  CL 99  --  99  --  99  CO2 34*  --  30  --  30  BUN 12  --  18  --  19  CREATININE 1.00  --  0.99  --  1.05  CALCIUM 7.5*  --  7.9*  --  7.9*  MG 1.1*  < > 2.2 1.9 2.0  PHOS 3.3  --  4.3  4.4 4.0 4.8*  GLUCOSE 60*  --  106*  --  343*  < > = values in this interval not displayed.  CBG (last 3)   Recent Labs  07/05/13 0419 07/05/13 0504 07/05/13 0720  GLUCAP 304* 328* 219*    Scheduled Meds: . diltiazem  30 mg Oral Q6H  . insulin aspart  0-9 Units Subcutaneous Q4H  . [START ON 07/06/2013] insulin detemir  12 Units Subcutaneous q morning - 10a  . levETIRAcetam  500 mg Oral BID  . phosphorus  500 mg Oral QID  . sodium chloride  3 mL Intravenous Q12H    Continuous Infusions: . sodium chloride 125 mL/hr at 06/30/13 1307  . sodium chloride 75 mL/hr at 07/04/13 1800    Clarene Duke RD, LDN Pager [phone] After Hours pager [phone]

## 2013-07-05 NOTE — Progress Notes (Signed)
PT Cancellation Note  Patient Details Name: Milania Haubner MRN: [id number] DOB: 1941/04/26   Cancelled Treatment:    Reason Eval/Treat Not Completed: Patient declined, no specific reason. 07/05/2013  [place] [phone]  (pager)   Ailish Prospero, Eliseo Gum 07/05/2013, 5:17 PM

## 2013-07-06 LAB — CBC WITH DIFFERENTIAL/PLATELET
Eosinophils Absolute: 0.2 10*3/uL (ref 0.0–0.7)
Hemoglobin: 8.8 g/dL — ABNORMAL LOW (ref 12.0–15.0)
Lymphocytes Relative: 30 % (ref 12–46)
Lymphs Abs: 2.5 10*3/uL (ref 0.7–4.0)
Monocytes Relative: 11 % (ref 3–12)
Neutro Abs: 4.8 10*3/uL (ref 1.7–7.7)
Neutrophils Relative %: 57 % (ref 43–77)
Platelets: 318 10*3/uL (ref 150–400)
RBC: 2.78 MIL/uL — ABNORMAL LOW (ref 3.87–5.11)
WBC: 8.5 10*3/uL (ref 4.0–10.5)

## 2013-07-06 LAB — COMPREHENSIVE METABOLIC PANEL
BUN: 17 mg/dL (ref 6–23)
CO2: 30 mEq/L (ref 19–32)
Calcium: 7.9 mg/dL — ABNORMAL LOW (ref 8.4–10.5)
Chloride: 100 mEq/L (ref 96–112)
Creatinine, Ser: 1.02 mg/dL (ref 0.50–1.10)
GFR calc Af Amer: 62 mL/min — ABNORMAL LOW (ref >90)
GFR calc non Af Amer: 54 mL/min — ABNORMAL LOW (ref >90)
Total Bilirubin: 0.5 mg/dL (ref 0.3–1.2)

## 2013-07-06 LAB — GLUCOSE, CAPILLARY
Glucose-Capillary: 235 mg/dL — ABNORMAL HIGH (ref 70–99)
Glucose-Capillary: 261 mg/dL — ABNORMAL HIGH (ref 70–99)
Glucose-Capillary: 63 mg/dL — ABNORMAL LOW (ref 70–99)

## 2013-07-06 LAB — MAGNESIUM: Magnesium: 1.1 mg/dL — ABNORMAL LOW (ref 1.5–2.5)

## 2013-07-06 LAB — PHOSPHORUS: Phosphorus: 4.7 mg/dL — ABNORMAL HIGH (ref 2.3–4.6)

## 2013-07-06 MED ORDER — INSULIN DETEMIR 100 UNIT/ML ~~LOC~~ SOLN
15.0000 [IU] | Freq: Every morning | SUBCUTANEOUS | Status: DC
  Administered 2013-07-07 – 2013-07-08 (×2): 15 [IU] via SUBCUTANEOUS
  Filled 2013-07-06 (×2): qty 0.15

## 2013-07-06 MED ORDER — INSULIN ASPART 100 UNIT/ML ~~LOC~~ SOLN
0.0000 [IU] | Freq: Three times a day (TID) | SUBCUTANEOUS | Status: DC
  Administered 2013-07-06: 5 [IU] via SUBCUTANEOUS
  Administered 2013-07-07: 3 [IU] via SUBCUTANEOUS

## 2013-07-06 NOTE — Progress Notes (Signed)
Inpatient Diabetes Program Recommendations  AACE/ADA: New Consensus Statement on Inpatient Glycemic Control (2013)  Target Ranges:  Prepandial:   less than 140 mg/dL      Peak postprandial:   less than 180 mg/dL (1-2 hours)      Critically ill patients:  140 - 180 mg/dL   Reason for Visit: Results for [name] [name], [name] [name] (MRN [id number]) as of 07/06/2013 13:24  Ref. Range 07/05/2013 11:52 07/05/2013 17:03 07/05/2013 21:07 07/06/2013 06:55 07/06/2013 11:42  Glucose-Capillary Latest Range: 70-99 mg/dL 782 (H) 956 (H) 213 (H) 292 (H) 225 (H)   Consider increasing Levemir to 15 units daily.  Also change Novolog correction to tid with meals and HS and add Novolog meal coverage 3 units tid with meals (hold if patient eats less than 50%)

## 2013-07-06 NOTE — Progress Notes (Signed)
Physical Therapy Treatment Patient Details Name: Journii Nierman MRN: [id number] DOB: 1941/03/20 Today's Date: 07/06/2013 Time: 6578-4696 PT Time Calculation (min): 19 min  PT Assessment / Plan / Recommendation  History of Present Illness 72 y.o. female with a hx of previous intracranial hemorrhage, seizures, htn, and diabetes who presented to the ED with recurrent seizures.   PT Comments   Pt mobility limited by elevated BP up to 143 with minimal activity and BP changes with sx of dizziness.  Pt also demos cognitive deficits today.  Will continue to follow.    Follow Up Recommendations  CIR     Does the patient have the potential to tolerate intense rehabilitation     Barriers to Discharge        Equipment Recommendations  None recommended by PT    Recommendations for Other Services    Frequency Min 3X/week   Progress towards PT Goals Progress towards PT goals: Progressing toward goals  Plan Current plan remains appropriate    Precautions / Restrictions Precautions Precautions: Fall   Pertinent Vitals/Pain Denies pain.      Mobility  Bed Mobility Bed Mobility: Supine to Sit;Sitting - Scoot to Edge of Bed;Sit to Supine Supine to Sit: 5: Supervision Sitting - Scoot to Edge of Bed: 5: Supervision Sit to Supine: 5: Supervision Transfers Transfers: Sit to Stand;Stand to Sit Sit to Stand: 4: Min assist;With upper extremity assist;From bed Stand to Sit: 4: Min assist;With upper extremity assist;To chair/3-in-1 Details for Transfer Assistance: cues for UE use and controlling descent to sit.   Ambulation/Gait Ambulation/Gait Assistance: 4: Min assist Ambulation Distance (Feet): 5 Feet (x2) Assistive device: 1 person hand held assist Ambulation/Gait Assistance Details: pt generally unsteady with gait distance limited by elevated HR up to 143 with minimal activity and decreases in BP with changes in position.   Gait Pattern: Step-through pattern;Decreased stride length Stairs:  No Wheelchair Mobility Wheelchair Mobility: No    Exercises     PT Diagnosis:    PT Problem List:   PT Treatment Interventions:     PT Goals (current goals can now be found in the care plan section) Acute Rehab PT Goals Patient Stated Goal: Pt unable to state. Husband would like pt to come back home. Time For Goal Achievement: 07/05/13 Potential to Achieve Goals: Good  Visit Information  Last PT Received On: 07/06/13 Assistance Needed: +1 PT/OT Co-Evaluation/Treatment: Yes History of Present Illness: 72 y.o. female with a hx of previous intracranial hemorrhage, seizures, htn, and diabetes who presented to the ED with recurrent seizures.    Subjective Data  Patient Stated Goal: Pt unable to state. Husband would like pt to come back home.   Cognition  Cognition Arousal/Alertness: Awake/alert Behavior During Therapy: WFL for tasks assessed/performed Overall Cognitive Status: Impaired/Different from baseline Area of Impairment: Memory;Safety/judgement;Problem solving;Awareness Orientation Level: Disoriented to;Time Current Attention Level: Sustained Memory: Decreased short-term memory Following Commands: Follows one step commands with increased time Safety/Judgement: Decreased awareness of safety;Decreased awareness of deficits Awareness: Emergent;Anticipatory Problem Solving: Requires verbal cues;Requires tactile cues    Balance  Balance Balance Assessed: Yes Static Standing Balance Static Standing - Balance Support: No upper extremity supported Static Standing - Level of Assistance: 4: Min assist  End of Session PT - End of Session Equipment Utilized During Treatment: Gait belt Activity Tolerance: Treatment limited secondary to medical complications (Comment) (elevated HR and decreased BP) Patient left: in bed;with call bell/phone within reach;with family/visitor present Nurse Communication: Mobility status   GP  Sunny Schlein, [employer] [phone] 07/06/2013, 1:13  PM

## 2013-07-06 NOTE — Progress Notes (Signed)
Occupational Therapy Treatment Patient Details Name: [name] [name] MRN: [id number] DOB: May 05, 1941 Today's Date: 07/06/2013 Time: 9629-5284 OT Time Calculation (min): 20 min  OT Assessment / Plan / Recommendation  History of present illness 72 y.o. female with a hx of previous intracranial hemorrhage, seizures, htn, and diabetes who presented to the ED with recurrent seizures.   OT comments  Pt limited by BP changes with symptoms of dizziness. Pt demonstrates cognitive deficits. Pt with HR 143 during session.   Follow Up Recommendations  CIR    Barriers to Discharge       Equipment Recommendations  None recommended by OT    Recommendations for Other Services Rehab consult  Frequency Min 2X/week   Progress towards OT Goals Progress towards OT goals: Progressing toward goals  Plan Discharge plan remains appropriate    Precautions / Restrictions Precautions Precautions: Fall   Pertinent Vitals/Pain See vitals    ADL  Grooming: Wash/dry hands;Modified independent Where Assessed - Grooming: Unsupported sitting Lower Body Bathing: Minimal assistance Where Assessed - Lower Body Bathing: Supported sit to stand Toilet Transfer: Minimal assistance Toilet Transfer Method: Sit to Barista: Raised toilet seat with arms (or 3-in-1 over toilet) Toileting - Clothing Manipulation and Hygiene: Minimal assistance Where Assessed - Toileting Clothing Manipulation and Hygiene: Sit to stand from 3-in-1 or toilet Equipment Used: Gait belt Transfers/Ambulation Related to ADLs: Pt limited ambulation due to changes in BP and HR. See vitals ADL Comments: pt supine on arrival. Pt asking "is that breakfast?" Pt demonstrates cognitive deficits this session. Pt required questions cues to determine appropriate meal pending arrival.     OT Diagnosis:    OT Problem List:   OT Treatment Interventions:     OT Goals(current goals can now be found in the care plan section) Acute Rehab  OT Goals Patient Stated Goal: Pt unable to state. Husband would like pt to come back home. OT Goal Formulation: With family Time For Goal Achievement: 07/12/13 Potential to Achieve Goals: Good ADL Goals Pt Will Perform Eating: with modified independence;sitting Pt Will Perform Grooming: with min assist;standing Pt Will Perform Upper Body Bathing: with min assist;sitting Pt Will Perform Lower Body Bathing: with min assist;sit to/from stand Pt Will Perform Upper Body Dressing: with min assist;sitting Pt Will Perform Lower Body Dressing: with min assist;sit to/from stand Pt Will Perform Toileting - Clothing Manipulation and hygiene: with min assist;sit to/from stand Additional ADL Goal #1: Pt will sustain attention to familiar ADL task x 3 mins with min verbal cues  Visit Information  Last OT Received On: 07/06/13 Assistance Needed: +1 History of Present Illness: 72 y.o. female with a hx of previous intracranial hemorrhage, seizures, htn, and diabetes who presented to the ED with recurrent seizures.    Subjective Data      Prior Functioning       Cognition  Cognition Arousal/Alertness: Awake/alert Behavior During Therapy: WFL for tasks assessed/performed Overall Cognitive Status: Impaired/Different from baseline Area of Impairment: Memory;Safety/judgement;Problem solving;Awareness Orientation Level: Disoriented to;Time Current Attention Level: Sustained Memory: Decreased short-term memory Following Commands: Follows one step commands with increased time Safety/Judgement: Decreased awareness of safety;Decreased awareness of deficits Awareness: Emergent;Anticipatory Problem Solving: Requires verbal cues;Requires tactile cues    Mobility  Bed Mobility Supine to Sit: 5: Supervision Sitting - Scoot to Edge of Bed: 5: Supervision Sit to Supine: 5: Supervision Transfers Sit to Stand: 4: Min assist;With upper extremity assist;From bed Stand to Sit: 4: Min assist;With upper  extremity assist;To chair/3-in-1  Exercises      Balance Static Standing Balance Static Standing - Balance Support: During functional activity Static Standing - Level of Assistance: 4: Min assist   End of Session OT - End of Session Activity Tolerance: Patient tolerated treatment well Patient left: in bed;with call bell/phone within reach Nurse Communication: Mobility status;Precautions  GO     Harolyn Rutherford 07/06/2013, 12:54 PM Pager: [phone]

## 2013-07-06 NOTE — Progress Notes (Signed)
Patient transferred from room 4N32 to room 4N17 at this time. Husband at side and took all belongings. Patient in stable condition.

## 2013-07-06 NOTE — Progress Notes (Signed)
[facility] PROGRESS NOTE  [name] [name] ZOX:[id number] DOB: 02-02-1941 DOA: 06/25/2013 PCP: Aida Puffer, MD  Brief narrative:  72 y.o. female with a hx of previous intracranial hemorrhage, seizures, HTN  and diabetes who presented to the ED with recurrent seizures. She presented to the ED on 8/20 with complaints of a headache lasting several days. This was associated with a left visual field cut. Neurology was consulted, and per recs, the patient underwent an MRI of the brain which was unremarkable. The patient was discharged home from the ED and treated symptomatically for her headache. On the morning of admission, ( 8/23) , the patient had a witnessed seizure. EMS was called. Upon EMS arrival, the patient was alert/oriented and the family refused transport. The pt then had another seizure and was transported to the ED. In the ED, the pt had another witnessed seizure lasting about 45sec. A CT head was done, which redemonstrated the known remote hmorrhagic infarct, without acute changes. Neurology was consulted and admitted to step down unit. 06/29/2013 patient sleepy, but arousable answers questions appropriately. Husband at bedside stated that a week prior to hospitalization episodes of passing out in the bathroom, and would be confused when she regained consciousness (sounds postictal). States prior to hospitalization last generalized seizure 10 months ago in October 2013. States yesterday again had an episode of passing out while in the bathroom (was not having BM) husband unsure whether she was more confused after she regained consciousness. Patient states currently no headache 06/30/2013 husband and nurse report that patient has had several incidences of what appears to be LOC while using the restroom (each time has been urination). At the time of LOC patient was hypotensive, today's episode patient was BP=98/62, HR= 102. After approximately 5 minutes patient came out of episode, negative postictal  signs or symptoms. 07/01/2013 feels much better, and is eating well ( hamburger french fries), only complaint is some soreness around her left gluteus maximus/ iliac crest which she attributes to sleeping in the hospital bed TODAY doing well has been eating interacting much better with medical staff. PT/OT have been in to see patient today as well as rehabilitation admission coordinator. Patient voices concern that she would like to go home. 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 Continue Keppra 500 mg by mouth twice a day discharge home. NOTE /OT both recommend CIR vs SNF.   -- ReConsult for inpatient neuro rehabilitation placed. Believe Pt would do better w/ a course of Inpatient Rehab; Rehabilitation coordinator RN Melanee Spry does not believe patient is strong enough to attend the CIR at this time. Spoke with patient and husband at length we'll most likely do better at a SNF  --We'll have physical therapy work with patient daily for deconditioning   Fever/leukocytosis Present on admission. Concern for aspiration pneumonia. The levofloxacin day 12/14.  Aspiration pneumonia vs Demargination from seizures --Counseled patient and family WBC have dec 8.5 now within normal limits    DM type 2  CBGs are climbing since patient has started to increase her consumption of food. therefore increase  Levemir 15 units each bedtime, continue SSI to moderate scale and add NovoLog 5 units q. a.c..  --06/29/2013 hemoglobin A1c= 8.6.  --06/29/2013 lipid panel within ADA guideline  HTN  --Patient's BP running high however no further episodes of micturition mediated syncope. Continue to hold all BP medication except Cardizem. Patient continues to be tachycardic however cardiology not concerned and  has signed off  CKD stage 3, GFR 30-59 ml/min  Renal fn stable  Diffuse T wave changes  Noted on EKG in ED. 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 Recommend regular diet   Thrush  IV Diflucan completed 7 days treatment   Anxiety/agitation; when necessary Ativan   Syncope (micturition mediated)  EKG, along with consult from cardiology and neurology do not support Autonomic dysfunction. Patient appears to have episodes of micturition mediated hypo-tension.  --- Continue to hold all BP medication, except Cardizem --- Ensure placement of a compression stockings   Hypokalemia; Patient most likely suffering from refeeding syndrome --Replete potassium. Currently within normal limits    Hypomagnesemia; Patient most likely suffering from refeeding syndrome   --Replete magnesium; currently within normal limits  Refeeding syndrome --Obtain potassium, magnesium, phosphorus levels daily   Hypophosphatemia; Patient most likely suffering from refeeding syndrome   --Continue to aggressively replete;  currently within normal limits  Severe malnutrition; his pre-albumin= 6.9 (reference range 17-34mg /dL) --Patient now increased consumption which is resulting in depletion of her electrolytes --Consult nutrition for appropriate balanced nutrition for correction of severe malnutrition; follow their recommendations  Code Status: FULL  Family Communication: Spoke with husband concerning plan of care Done at bedside Disposition Plan: Physical therapy decrements CIR. Seen by CIR and recommend  home health  Consultants:  Neurology     Procedures:  8/24 - LP per Neuro  CSF culture negative CSF Gram stain Negative CSF cell count negative CSF glucose high   Procedure CT head without contrast 06/22/2013 IMPRESSION:  1. No acute intracranial findings.  2. Known remote hemorrhagic infarction in the right posteromedial  temporal lobe.  3. Periventricular white matter and corona radiata hypodensities  favor chronic ischemic microvascular white matter disease.  MRI brain without contrast  06/22/2013 IMPRESSION:  No acute infarction. Moderate chronic small vessel changes of the  cerebral hemispheric white matter. Old hemorrhagic infarction in  the right posteromedial temporal lobe that could effect the optic  radiations on the right.    Antibiotics:  Ampicillin 8/23 >> 8/24  Rocephin 8/23 >> 8/24  Vancomycin 8/23 >> 8/24  Levaquin 8/23, day 12/14 IV Diflucan 8/24-8/30, day 7/7   DVT prophylaxis:  SCDs   PCXR 07/02/2013 S/P placement of right IJ central line  Findings: A new right jugular central line is noted. No  pneumothorax is seen. Catheter tip is noted at the cavoatrial  junction. The lungs are clear. Cardiac shadow is stable.  IMPRESSION:  No evidence of pneumothorax following central line placement  Urine culture 07/01/2013; negative Blood culture x2 on 07/01/2013; negative      Filed Vitals:   07/06/13 1430  BP: 142/54  Pulse: 116  Temp: 98.7 F (37.1 C)  Resp: 18    Intake/Output Summary (Last 24 hours) at 07/06/13 1618 Last data filed at 07/06/13 1103  Gross per 24 hour  Intake 3583.75 ml  Output   1000 ml  Net 2583.75 ml   Filed Weights   07/04/13 0500 07/05/13 0500 07/06/13 0500  Weight: 52.028 kg (114 lb 11.2 oz) 52.118 kg (114 lb 14.4 oz) 49.1 kg (108 lb 3.9 oz)    Exam:   General: Alert,NAD    Cardiovascular; regular rhythm tachycardic, negative murmurs rubs gallops, DP/PT pulse 1+ bilateral  Chest: Clear to auscultation bilaterally  Abdomen: Soft, nontender, nondistended, bowel sounds present  Extremities: Warm, no edema  CNS: Alert and oriented x4 (following LOC), cranial nerves II through  XII intact    Data Reviewed: Basic Metabolic Panel:  Recent Labs Lab 07/02/13 0506  07/03/13 0450 07/03/13 1725 07/04/13 0500 07/04/13 2340 07/05/13 0515 07/06/13 0520  NA 143  --  142  --  141  --  137 139  K 2.9*  < > 2.5* 4.1 3.6 4.0 3.8 4.0  CL 101  --  99  --  99  --  99 100  CO2 31  --  34*  --  30  --  30 30   GLUCOSE 104*  --  60*  --  106*  --  343* 357*  BUN 8  --  12  --  18  --  19 17  CREATININE 0.96  --  1.00  --  0.99  --  1.05 1.02  CALCIUM 7.8*  --  7.5*  --  7.9*  --  7.9* 7.9*  MG 1.5  --  1.1* 4.5* 2.2 1.9 2.0 1.1*  PHOS  --   --  3.3  --  4.3  4.4 4.0 4.8* 4.7*  < > = values in this interval not displayed. Liver Function Tests:  Recent Labs Lab 07/02/13 0506 07/03/13 0450 07/04/13 0500 07/05/13 0515 07/06/13 0520  AST 29 23 30 25 22   ALT 18 16 18 17 17   ALKPHOS 91 88 89 108 122*  BILITOT 0.5 0.5 0.3 0.3 0.5  PROT 6.1 5.7* 5.9* 5.6* 5.7*  ALBUMIN 2.2* 2.2* 2.1* 2.0* 2.2*   No results found for this basename: LIPASE, AMYLASE,  in the last 168 hours No results found for this basename: AMMONIA,  in the last 168 hours CBC:  Recent Labs Lab 07/02/13 0506 07/03/13 0450 07/04/13 0500 07/05/13 0515 07/06/13 0520  WBC 12.8* 11.1* 9.4 9.1 8.5  NEUTROABS 9.7* 6.4 6.4 6.5 4.8  HGB 10.6* 9.5* 9.2* 8.7* 8.8*  HCT 31.4* 29.1* 27.5* 26.6* 27.1*  MCV 94.6 96.0 96.2 96.7 97.5  PLT 219 235 257 264 318   Cardiac Enzymes: No results found for this basename: CKTOTAL, CKMB, CKMBINDEX, TROPONINI,  in the last 168 hours BNP (last 3 results) No results found for this basename: PROBNP,  in the last 8760 hours CBG:  Recent Labs Lab 07/05/13 1152 07/05/13 1703 07/05/13 2107 07/06/13 0655 07/06/13 1142  GLUCAP 204* 272* 197* 292* 225*    Recent Results (from the past 240 hour(s))  CULTURE, BLOOD (ROUTINE X 2)     Status: None   Collection Time    07/01/13  8:45 AM      Result Value Range Status   Specimen Description BLOOD RIGHT ARM   Final   Special Requests BOTTLES DRAWN AEROBIC AND ANAEROBIC 10 ML   Final   Culture  Setup Time     Final   Value: 07/01/2013 15:35     Performed at [facility]   Culture     Final   Value:        BLOOD CULTURE RECEIVED NO GROWTH TO DATE CULTURE WILL BE HELD FOR 5 DAYS BEFORE ISSUING A FINAL NEGATIVE REPORT     Performed at  [facility]   Report Status PENDING   Incomplete  CULTURE, BLOOD (ROUTINE X 2)     Status: None   Collection Time    07/01/13  9:00 AM      Result Value Range Status   Specimen Description BLOOD LEFT ARM   Final   Special Requests BOTTLES DRAWN AEROBIC AND ANAEROBIC 10 ML  Final   Culture  Setup Time     Final   Value: 07/01/2013 15:35     Performed at [facility]   Culture     Final   Value:        BLOOD CULTURE RECEIVED NO GROWTH TO DATE CULTURE WILL BE HELD FOR 5 DAYS BEFORE ISSUING A FINAL NEGATIVE REPORT     Performed at [facility]   Report Status PENDING   Incomplete  URINE CULTURE     Status: None   Collection Time    07/01/13  5:44 PM      Result Value Range Status   Specimen Description URINE, RANDOM   Final   Special Requests NONE   Final   Culture  Setup Time     Final   Value: 07/01/2013 18:07     Performed at [facility] Count     Final   Value: NO GROWTH     Performed at [facility]   Culture     Final   Value: NO GROWTH     Performed at [facility]   Report Status 07/02/2013 FINAL   Final     Studies: No results found.  Scheduled Meds: . diltiazem  30 mg Oral Q6H  . insulin aspart  0-9 Units Subcutaneous TID AC & HS  . [START ON 07/07/2013] insulin detemir  15 Units Subcutaneous q morning - 10a  . levETIRAcetam  500 mg Oral BID  . phosphorus  500 mg Oral QID  . sodium chloride  3 mL Intravenous Q12H   Continuous Infusions: . sodium chloride 125 mL/hr at 06/30/13 1307  . sodium chloride 75 mL/hr at 07/06/13 0603      Time spent: 30 minutes    [name] [name], Roselind Messier  [facility] Pager [phone] If 7PM-7AM, please contact night-coverage at [email or web address], password [facility] 07/06/2013, 4:18 PM  LOS: 11 days

## 2013-07-07 LAB — CBC WITH DIFFERENTIAL/PLATELET
Eosinophils Absolute: 0.2 10*3/uL (ref 0.0–0.7)
Hemoglobin: 9.1 g/dL — ABNORMAL LOW (ref 12.0–15.0)
Lymphs Abs: 2.6 10*3/uL (ref 0.7–4.0)
MCH: 32.2 pg (ref 26.0–34.0)
Monocytes Relative: 9 % (ref 3–12)
Neutro Abs: 6.5 10*3/uL (ref 1.7–7.7)
Neutrophils Relative %: 63 % (ref 43–77)
RBC: 2.83 MIL/uL — ABNORMAL LOW (ref 3.87–5.11)

## 2013-07-07 LAB — CULTURE, BLOOD (ROUTINE X 2): Culture: NO GROWTH

## 2013-07-07 LAB — COMPREHENSIVE METABOLIC PANEL
AST: 22 U/L (ref 0–37)
Albumin: 2.3 g/dL — ABNORMAL LOW (ref 3.5–5.2)
CO2: 30 mEq/L (ref 19–32)
Calcium: 7.8 mg/dL — ABNORMAL LOW (ref 8.4–10.5)
Creatinine, Ser: 0.88 mg/dL (ref 0.50–1.10)
GFR calc non Af Amer: 64 mL/min — ABNORMAL LOW (ref >90)

## 2013-07-07 LAB — PHOSPHORUS: Phosphorus: 5.3 mg/dL — ABNORMAL HIGH (ref 2.3–4.6)

## 2013-07-07 LAB — GLUCOSE, CAPILLARY: Glucose-Capillary: 168 mg/dL — ABNORMAL HIGH (ref 70–99)

## 2013-07-07 MED ORDER — DILTIAZEM HCL 60 MG PO TABS
60.0000 mg | ORAL_TABLET | Freq: Three times a day (TID) | ORAL | Status: DC
  Administered 2013-07-07 – 2013-07-08 (×4): 60 mg via ORAL
  Filled 2013-07-07 (×6): qty 1

## 2013-07-07 MED ORDER — INSULIN ASPART 100 UNIT/ML ~~LOC~~ SOLN: 0.0000 [IU] | Freq: Every day | SUBCUTANEOUS | Status: DC

## 2013-07-07 MED ORDER — INSULIN ASPART 100 UNIT/ML ~~LOC~~ SOLN: 0.0000 [IU] | Freq: Three times a day (TID) | SUBCUTANEOUS | Status: DC

## 2013-07-07 MED ORDER — INSULIN ASPART 100 UNIT/ML ~~LOC~~ SOLN
0.0000 [IU] | Freq: Three times a day (TID) | SUBCUTANEOUS | Status: DC
  Administered 2013-07-07: 7 [IU] via SUBCUTANEOUS
  Administered 2013-07-07 – 2013-07-08 (×2): 2 [IU] via SUBCUTANEOUS
  Administered 2013-07-08: 3 [IU] via SUBCUTANEOUS
  Administered 2013-07-08: 7 [IU] via SUBCUTANEOUS

## 2013-07-07 MED ORDER — MAGNESIUM OXIDE 400 (241.3 MG) MG PO TABS
800.0000 mg | ORAL_TABLET | Freq: Two times a day (BID) | ORAL | Status: DC
  Administered 2013-07-07 – 2013-07-08 (×3): 800 mg via ORAL
  Filled 2013-07-07 (×4): qty 2

## 2013-07-07 NOTE — Progress Notes (Signed)
[facility] PROGRESS NOTE  [name] [name] ZOX:[id number] DOB: [birth year] DOA: 06/25/2013 PCP: Aida Puffer, MD  Brief narrative:   72 year old female admitted 06/25/2013 with recurrent seizures.  She presented to the ED on 8/20 with complaints of a headache lasting several days. Neurology was consulted for the same, and it was ultimately determined that patient had micturition induced syncope +/- Orthostasis cardiology saw the patient because of persisting sinus tachycardia and thought that this was secondary to MAT. 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 Continue Keppra 500 mg by mouth twice a day discharge home. Current disposition is skilled nursing facility --We'll have physical therapy work with patient daily for deconditioning   Fever/leukocytosis Present on admission. Concern for aspiration pneumonia. Rx till 8/31/14Aspiration pneumonia vs Demargination from seizures --Counseled patient and family WBC have dec 8.5 now within normal limits --Would discontinue antibiotics today 07/07/13 given fever defervescence   DM type 2  CBGs are climbing since patient has started to increase her consumption of food. therefore increase  Levemir 15 units each bedtime, continue SSI to moderate scale and add NovoLog 5 units q. A.c.-CBG's 301-->150.  --06/29/2013 hemoglobin A1c= 8.6.  --06/29/2013 lipid panel within ADA guideline  HTN  --Patient's BP running high however no further episodes of micturition mediated syncope. Continue to hold all BP medication except Cardizem. Patient continues to be tachycardic.  Cardizem adjusted to 180 mg as 60 mg TID.  CKD stage 3, GFR 30-59 ml/min  Renal fn stable  Diffuse T wave changes  Noted on EKG in ED. 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 Recommend regular diet  Thrush  IV Diflucan completed 7 days  treatment  Anxiety/agitation; when necessary Ativan   Syncope (micturition mediated)  EKG, along with consult from cardiology and neurology do not support Autonomic dysfunction. Patient appears to have episodes of micturition mediated hypo-tension.  --- Continue to hold all BP medication, except Cardizem --- Ensure placement of a compression stockings  Hypokalemia; Patient most likely suffering from refeeding syndrome --Replete potassium. Currently within normal limits   Hypomagnesemia; Patient most likely suffering from refeeding syndrome   --Repleting magnesium-recheck in 1 week  Refeeding syndrome --Outpatient labs  Hypophosphatemia; Patient most likely suffering from refeeding syndrome   --Continue to aggressively replete;  currently within normal limits  Severe malnutrition; his pre-albumin= 6.9 (reference range 17-34mg /dL) --Patient now increased consumption which is resulting in depletion of her electrolytes --Consult nutrition for appropriate balanced nutrition for correction of severe malnutrition; follow their recommendations  Code Status: FULL  Family Communication: Spoke with husband concerning plan of care Done at bedside Disposition Plan: Physical therapy  Consultants:  Neurology  Cardiology    Procedures:  8/24 - LP per Neuro  CSF culture negative CSF Gram stain Negative CSF cell count negative CSF glucose high   Procedure CT head without contrast 06/22/2013 IMPRESSION:  1. No acute intracranial findings.  2. Known remote hemorrhagic infarction in the right posteromedial  temporal lobe.  3. Periventricular white matter and corona radiata hypodensities  favor chronic ischemic microvascular white matter disease.  MRI brain without contrast 06/22/2013 IMPRESSION:  No acute infarction. Moderate chronic small vessel changes of the  cerebral hemispheric white matter. Old hemorrhagic infarction in  the right posteromedial temporal lobe that could effect  the optic  radiations on the right.    Antibiotics:  Ampicillin 8/23 >> 8/24  Rocephin 8/23 >> 8/24  Vancomycin 8/23 >> 8/24  Levaquin 8/23, day 12/14 IV Diflucan 8/24-8/30, day 7/7   DVT prophylaxis:  SCDs   PCXR 07/02/2013 S/P placement of right IJ central line  Findings: A new right jugular central line is noted. No  pneumothorax is seen. Catheter tip is noted at the cavoatrial  junction. The lungs are clear. Cardiac shadow is stable.  IMPRESSION:  No evidence of pneumothorax following central line placement  Urine culture 07/01/2013; negative Blood culture x2 on 07/01/2013; negative      Filed Vitals:   07/07/13 0911  BP: 142/51  Pulse: 137  Temp:   Resp:     Intake/Output Summary (Last 24 hours) at 07/07/13 1121 Last data filed at 07/07/13 0802  Gross per 24 hour  Intake    240 ml  Output      0 ml  Net    240 ml   Filed Weights   07/05/13 0500 07/06/13 0500 07/07/13 0500  Weight: 52.118 kg (114 lb 14.4 oz) 49.1 kg (108 lb 3.9 oz) 47.492 kg (104 lb 11.2 oz)    Exam:   General: Alert,NAD    Cardiovascular; regular rhythm tachycardic, negative murmurs rubs gallops, DP/PT pulse 1+ bilateral  Chest: Clear to auscultation bilaterally  Abdomen: Soft, nontender, nondistended, bowel sounds present  Extremities: Warm, no edema  CNS: Alert and oriented x4 (following LOC), cranial nerves II through XII intact   Data Reviewed: Basic Metabolic Panel:  Recent Labs Lab 07/03/13 0450  07/04/13 0500 07/04/13 2340 07/05/13 0515 07/06/13 0520 07/07/13 0545  NA 142  --  141  --  137 139 142  K 2.5*  < > 3.6 4.0 3.8 4.0 3.6  CL 99  --  99  --  99 100 101  CO2 34*  --  30  --  30 30 30   GLUCOSE 60*  --  106*  --  343* 357* 197*  BUN 12  --  18  --  19 17 18   CREATININE 1.00  --  0.99  --  1.05 1.02 0.88  CALCIUM 7.5*  --  7.9*  --  7.9* 7.9* 7.8*  MG 1.1*  < > 2.2 1.9 2.0 1.1* 0.7*  PHOS 3.3  --  4.3  4.4 4.0 4.8* 4.7* 5.3*  < > = values in this  interval not displayed. Liver Function Tests:  Recent Labs Lab 07/03/13 0450 07/04/13 0500 07/05/13 0515 07/06/13 0520 07/07/13 0545  AST 23 30 25 22 22   ALT 16 18 17 17 15   ALKPHOS 88 89 108 122* 113  BILITOT 0.5 0.3 0.3 0.5 0.5  PROT 5.7* 5.9* 5.6* 5.7* 5.9*  ALBUMIN 2.2* 2.1* 2.0* 2.2* 2.3*   No results found for this basename: LIPASE, AMYLASE,  in the last 168 hours No results found for this basename: AMMONIA,  in the last 168 hours CBC:  Recent Labs Lab 07/03/13 0450 07/04/13 0500 07/05/13 0515 07/06/13 0520 07/07/13 0545  WBC 11.1* 9.4 9.1 8.5 10.3  NEUTROABS 6.4 6.4 6.5 4.8 6.5  HGB 9.5* 9.2* 8.7* 8.8* 9.1*  HCT 29.1* 27.5* 26.6* 27.1* 27.5*  MCV 96.0 96.2 96.7 97.5 97.2  PLT 235 257 264 318 333   Cardiac Enzymes: No results found for this basename: CKTOTAL, CKMB, CKMBINDEX, TROPONINI,  in the last 168 hours BNP (last 3 results) No results found for this basename: PROBNP,  in the last 8760 hours CBG:  Recent Labs Lab 07/06/13 1950 07/06/13 2243 07/06/13 2353 07/07/13 0418 07/07/13 9147  GLUCAP 288* 261* 235* 150* 222*    Recent Results (from the past 240 hour(s))  CULTURE, BLOOD (ROUTINE X 2)     Status: None   Collection Time    07/01/13  8:45 AM      Result Value Range Status   Specimen Description BLOOD RIGHT ARM   Final   Special Requests BOTTLES DRAWN AEROBIC AND ANAEROBIC 10 ML   Final   Culture  Setup Time     Final   Value: 07/01/2013 15:35     Performed at [facility]   Culture     Final   Value: NO GROWTH 5 DAYS     Performed at [facility]   Report Status 07/07/2013 FINAL   Final  CULTURE, BLOOD (ROUTINE X 2)     Status: None   Collection Time    07/01/13  9:00 AM      Result Value Range Status   Specimen Description BLOOD LEFT ARM   Final   Special Requests BOTTLES DRAWN AEROBIC AND ANAEROBIC 10 ML   Final   Culture  Setup Time     Final   Value: 07/01/2013 15:35     Performed at [facility]    Culture     Final   Value: NO GROWTH 5 DAYS     Performed at [facility]   Report Status 07/07/2013 FINAL   Final  URINE CULTURE     Status: None   Collection Time    07/01/13  5:44 PM      Result Value Range Status   Specimen Description URINE, RANDOM   Final   Special Requests NONE   Final   Culture  Setup Time     Final   Value: 07/01/2013 18:07     Performed at [facility] Count     Final   Value: NO GROWTH     Performed at [facility]   Culture     Final   Value: NO GROWTH     Performed at [facility]   Report Status 07/02/2013 FINAL   Final     Studies: No results found.  Scheduled Meds: . diltiazem  60 mg Oral Q8H  . insulin aspart  0-5 Units Subcutaneous QHS  . insulin aspart  0-9 Units Subcutaneous TID WC  . insulin detemir  15 Units Subcutaneous q morning - 10a  . levETIRAcetam  500 mg Oral BID  . magnesium oxide  800 mg Oral BID  . phosphorus  500 mg Oral QID  . sodium chloride  3 mL Intravenous Q12H   Continuous Infusions: . sodium chloride 125 mL/hr at 06/30/13 1307  . sodium chloride 75 mL/hr at 07/07/13 1025      Time spent: 30 minutes    Mahala Menghini [facility]  [facility] Pager [phone] If 7PM-7AM, please contact night-coverage at [email or web address], password [facility] 07/07/2013, 11:21 AM  LOS: 12 days

## 2013-07-07 NOTE — Clinical Social Work Note (Signed)
CSW met with pt and pt's husband at bedside. Pt's wife and husband stated to CSW that they would like for the pt to return home once medically discharged, rather than SNF placement. CSW contacted Case Management to inform of information above.  CSW signing off of pt. Please re-consult if new concerns arise.  Darlyn Chamber, MSW, LCSWA Clinical Social Work [phone]

## 2013-07-07 NOTE — Progress Notes (Signed)
Physical Therapy Treatment Patient Details Name: [name] [name] MRN: [id number] DOB: 1941/04/18 Today's Date: 07/07/2013 Time: 3086-5784 PT Time Calculation (min): 41 min  PT Assessment / Plan / Recommendation  History of Present Illness 72 y.o. female with a hx of previous intracranial hemorrhage, seizures, htn, and diabetes who presented to the ED with recurrent seizures.   PT Comments   Pt with much improved tolerance to activity.  Follow Up Recommendations  CIR     Does the patient have the potential to tolerate intense rehabilitation     Barriers to Discharge        Equipment Recommendations  None recommended by PT    Recommendations for Other Services    Frequency Min 3X/week   Progress towards PT Goals Progress towards PT goals: Progressing toward goals  Plan Current plan remains appropriate    Precautions / Restrictions Precautions Precautions: Fall Restrictions Weight Bearing Restrictions: No   Pertinent Vitals/Pain See flow sheet.    Mobility  Bed Mobility Supine to Sit: 6: Modified independent (Device/Increase time) Sitting - Scoot to Edge of Bed: 6: Modified independent (Device/Increase time) Transfers Sit to Stand: 4: Min assist;With upper extremity assist;With armrests;From bed;From chair/3-in-1 Stand to Sit: 4: Min guard;With upper extremity assist;With armrests;To bed;To chair/3-in-1 Stand Pivot Transfers: 4: Min assist Details for Transfer Assistance: verbal cues for hand placement Ambulation/Gait Ambulation/Gait Assistance: 4: Min assist Ambulation Distance (Feet): 300 Feet (300, x 1, 125, x 1) Assistive device: None (IV pole) Ambulation/Gait Assistance Details: Assist for balance. Gait Pattern: Step-through pattern;Decreased stride length;Narrow base of support General Gait Details: HR up to 137 with standing and 140 with amb.    Exercises     PT Diagnosis:    PT Problem List:   PT Treatment Interventions:     PT Goals (current goals can now be  found in the care plan section) Acute Rehab PT Goals PT Goal Formulation: With patient Time For Goal Achievement: 07/14/13  Visit Information  Last PT Received On: 07/07/13 Assistance Needed: +1 History of Present Illness: 72 y.o. female with a hx of previous intracranial hemorrhage, seizures, htn, and diabetes who presented to the ED with recurrent seizures.    Subjective Data      Cognition  Cognition Arousal/Alertness: Awake/alert Behavior During Therapy: WFL for tasks assessed/performed Memory: Decreased short-term memory Following Commands: Follows one step commands consistently    Balance  Static Standing Balance Static Standing - Balance Support: No upper extremity supported Static Standing - Level of Assistance: 4: Min assist  End of Session PT - End of Session Equipment Utilized During Treatment: Gait belt Activity Tolerance: Patient tolerated treatment well Patient left: in chair;with call bell/phone within reach;with family/visitor present Nurse Communication: Mobility status   GP     Herson Prichard 07/07/2013, 10:06 AM  [employer] PT [phone]

## 2013-07-07 NOTE — Progress Notes (Signed)
Talked to patient with spouse present about DCP; patient is tearful and stated " I want to go home but I don't think that is the best plan for me". Patient is agreeable to short term skilled placement; Loel Dubonnet made aware; CM and SW talked to the patient and spouse together and she agreed to go to SNF; B [employer]

## 2013-07-07 NOTE — Progress Notes (Signed)
CRITICAL VALUE ALERT  Critical value received:  Mag 0.7  Date of notification:  07/07/2013    Time of notification:  0746  Critical value read back:yes  Nurse who received alert:  J. Mohsin Crum, RN  MD notified (1st page):  Dr. Mahala Menghini  Time of first page:  [phone]  MD notified (2nd page):  Time of second page:  Responding MD:  Dr. Mahala Menghini  Time MD responded:  [phone]

## 2013-07-07 NOTE — Progress Notes (Signed)
Patient positive for orthostatic vitals. 125/65 on sitting, down to 98/46 on standing.

## 2013-07-08 LAB — COMPREHENSIVE METABOLIC PANEL
ALT: 15 U/L (ref 0–35)
AST: 24 U/L (ref 0–37)
CO2: 29 mEq/L (ref 19–32)
Chloride: 103 mEq/L (ref 96–112)
GFR calc non Af Amer: 60 mL/min — ABNORMAL LOW (ref >90)
Potassium: 3.6 mEq/L (ref 3.5–5.1)
Sodium: 143 mEq/L (ref 135–145)
Total Bilirubin: 0.4 mg/dL (ref 0.3–1.2)

## 2013-07-08 LAB — CBC WITH DIFFERENTIAL/PLATELET
Basophils Relative: 0 % (ref 0–1)
Eosinophils Absolute: 0.2 10*3/uL (ref 0.0–0.7)
Eosinophils Relative: 2 % (ref 0–5)
MCH: 32 pg (ref 26.0–34.0)
MCHC: 32.7 g/dL (ref 30.0–36.0)
Monocytes Relative: 11 % (ref 3–12)
Neutrophils Relative %: 56 % (ref 43–77)
Platelets: 369 10*3/uL (ref 150–400)

## 2013-07-08 LAB — PHOSPHORUS: Phosphorus: 4.6 mg/dL (ref 2.3–4.6)

## 2013-07-08 LAB — MAGNESIUM: Magnesium: 0.7 mg/dL — CL (ref 1.5–2.5)

## 2013-07-08 LAB — GLUCOSE, CAPILLARY: Glucose-Capillary: 309 mg/dL — ABNORMAL HIGH (ref 70–99)

## 2013-07-08 MED ORDER — MAGNESIUM 500 MG PO TABS: 500.0000 mg | ORAL_TABLET | Freq: Three times a day (TID) | ORAL | Status: DC

## 2013-07-08 MED ORDER — MAGNESIUM SULFATE 40 MG/ML IJ SOLN
2.0000 g | Freq: Once | INTRAMUSCULAR | Status: AC
  Administered 2013-07-08: 2 g via INTRAVENOUS
  Filled 2013-07-08: qty 50

## 2013-07-08 MED ORDER — LEVETIRACETAM 500 MG PO TABS: 500.0000 mg | ORAL_TABLET | Freq: Two times a day (BID) | ORAL | Status: DC

## 2013-07-08 MED ORDER — ADULT MULTIVITAMIN W/MINERALS CH
1.0000 | ORAL_TABLET | Freq: Every day | ORAL | Status: DC
  Administered 2013-07-08: 1 via ORAL
  Filled 2013-07-08: qty 1

## 2013-07-08 MED ORDER — DILTIAZEM HCL 60 MG PO TABS: 60.0000 mg | ORAL_TABLET | Freq: Three times a day (TID) | ORAL | Status: DC

## 2013-07-08 MED ORDER — MAGNESIUM SULFATE IN D5W 10-5 MG/ML-% IV SOLN
1.0000 g | Freq: Once | INTRAVENOUS | Status: AC
  Administered 2013-07-08: 1 g via INTRAVENOUS
  Filled 2013-07-08: qty 100

## 2013-07-08 MED ORDER — MAGNESIUM OXIDE 400 (241.3 MG) MG PO TABS: 800.0000 mg | ORAL_TABLET | Freq: Two times a day (BID) | ORAL | Status: DC

## 2013-07-08 NOTE — Progress Notes (Signed)
Rehab admissions - Patient making good progress.  Able to ambulate 300 ft with minimum assist.  Noted patient and husband agreeable to SNF placement.  I will sign off for acute inpatient rehab at this time.  Call me for questions.  #[phone]

## 2013-07-08 NOTE — Discharge Summary (Signed)
Physician Discharge Summary  [name] [name] ZOX:[id number] DOB: 23-Jun-1941 DOA: 06/25/2013  PCP: Aida Puffer, MD  Admit date: 06/25/2013 Discharge date: 07/08/2013  Time spent: 35 minutes  Recommendations for Outpatient Follow-up:  1. Patient will need 1-2 g daily of magnesium, then we will need to repeat urinary 24-hour magnesium 2. Patient will need a for patient she 3. Patient will need cardiology followup for her MAT-Cardizem was transitioned to 60 mg 3 times a day-she should get pressure checks a to prevent orthostasis 4. Recommend A1c as outpatient 5. Recommend discussion as an outpatient if needed with nephrology if magnesium is still low  Discharge Diagnoses:  Principal Problem:   Seizure Active Problems:   DM type 2 (diabetes mellitus, type 2)   HTN (hypertension)   CKD (chronic kidney disease) stage 3, GFR 30-59 ml/min   Hemorrhage in the brain-13 x 22 x 14 mm right posterior temporo-occipital intra-axial acute   Syncope   Protein-calorie malnutrition, severe   Leukocytosis, unspecified   Hypokalemia   Hypomagnesemia   Tachycardia   Hypophosphatemia   Encounter for central line placement   Discharge Condition: fair  Diet recommendation: Regular heart healthy diet  Filed Weights   07/06/13 0500 07/07/13 0500 07/08/13 0511  Weight: 49.1 kg (108 lb 3.9 oz) 47.492 kg (104 lb 11.2 oz) 50.304 kg (110 lb 14.4 oz)    History of present illness:  72 year old female admitted 06/25/2013 with recurrent seizures. She presented to the ED on 8/20 with complaints of a headache lasting several days. Neurology was consulted for the same, and it was ultimately determined that patient had micturition induced syncope +/- Orthostasis As well as a recurrent grand mal seizures cardiology saw the patient because of persisting sinus tachycardia and thought that this was secondary to MAT.  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 Current disposition is skilled nursing facility-outpatient followup with neurology has already been arranged    Fever/leukocytosis  Present on admission. Concern for aspiration pneumonia. Rx till 07/03/13 Aspiration pneumonia vs Demargination from seizures  --Counseled patient and family WBC have dec 8.5 now within normal limits    DM type 2  CBGs are climbing since patient has started to increase her consumption of food. therefore increase Levemir 15 units each bedtime, continue SSI to moderate scale and add NovoLog 5 units q. A.c.-CBG's 301-->150.  --06/29/2013 hemoglobin A1c= 8.6.  --06/29/2013 lipid panel within ADA guideline  --Blood sugar control optimized during hospital stay on 15 units of Lantus between 12/03/1980-she will be discharged home on 8 units of Lantus/11 there, this can be up titrated dependent on her following CBGs  HTN  --Patient's BP running high however no further episodes of micturition mediated syncope. Continue to hold all BP medication except Cardizem. Patient continues to be tachycardic. Cardizem adjusted to 180 mg as 60 mg TID.  Patient should follow up with her cardiologist Dr. Elease Hashimoto for further instruction  CKD stage 3, GFR 30-59 ml/min  Renal fn stable   Diffuse T wave changes  Noted on EKG in ED. 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 Recommend regular diet   Thrush  IV Diflucan completed 7 days treatment   Anxiety/agitation; when necessary Ativan   Syncope  (micturition mediated)  EKG, along with consult from cardiology and neurology do not support Autonomic dysfunction. Patient appears to have episodes of micturition mediated hypo-tension.  --- Continue to hold all BP medication, except Cardizem    Hypomagnesemia; Patient most likely suffering from refeeding syndrome  --Repleting magnesium-recheck in 1 week  --Discussed with nephrology 07/08/13-recommends repeating magnesium 1.5 g daily and then checking a urine 24-hour magnesium collection to insure repletion   Severe malnutrition; his pre-albumin= 6.9 (reference range 17-34mg /dL)  --Patient now increased consumption which is resulting in depletion of her electrolytes  --Consult nutrition for appropriate balanced nutrition for correction of severe malnutrition; follow their recommendations     Consultants:  Neurology  Cardiology    Procedures:  8/24 - LP per Neuro  CSF culture negative  CSF Gram stain Negative  CSF cell count negative  CSF glucose high  Procedure  CT head without contrast 06/22/2013  IMPRESSION:  1. No acute intracranial findings.  2. Known remote hemorrhagic infarction in the right posteromedial  temporal lobe.  3. Periventricular white matter and corona radiata hypodensities  favor chronic ischemic microvascular white matter disease.  MRI brain without contrast 06/22/2013  IMPRESSION:  No acute infarction. Moderate chronic small vessel changes of the  cerebral hemispheric white matter. Old hemorrhagic infarction in  the right posteromedial temporal lobe that could effect the optic  radiations on the right.  Antibiotics:  Ampicillin 8/23 >> 8/24  Rocephin 8/23 >> 8/24  Vancomycin 8/23 >> 8/24  Levaquin 8/23, day 12/14  IV Diflucan 8/24-8/30, day 7/7  DVT prophylaxis:  SCDs  PCXR 07/02/2013 S/P placement of right IJ central line  Findings: A new right jugular central line is noted. No  pneumothorax is seen. Catheter tip is noted at the cavoatrial   junction. The lungs are clear. Cardiac shadow is stable.  IMPRESSION:  No evidence of pneumothorax following central line placement  Urine culture 07/01/2013; negative  Blood culture x2 on 07/01/2013; negative   Discharge Exam: Filed Vitals:   07/08/13 0925  BP: 100/44  Pulse: 125  Temp:   Resp:    Will alert and oriented  Still somewhat undecided as to what ago but understands the need for skilled nursing placement-husband in room and is aware of the same 2  General: Doing well  Cardiovascular: S1-S2 no murmur rub or gallop, sinus tachycardia 110 to 1:15 which is normal for fur her   Respiratory:  clear  Discharge Instructions  Discharge Orders   Future Appointments Provider Department Dept Phone   08/24/2013 2:00 PM Nilda Riggs, NP [facility] [phone]   Future Orders Complete By Expires   Diet - low sodium heart healthy  As directed    Discharge instructions  As directed    Comments:     You were cared for by a hospitalist during your hospital stay. If you have any questions about your discharge medications or the care you received while you were in the hospital after you are discharged, you can call the unit and asked to speak with the hospitalist on call if the hospitalist that took care of you is not available. Once you are discharged, your primary care physician will handle any further medical issues. Please note that NO REFILLS for  any discharge medications will be authorized once you are discharged, as it is imperative that you return to your primary care physician (or establish a relationship with a primary care physician if you do not have one) for your aftercare needs so that they can reassess your need for medications and monitor your lab values. If you do not have a primary care physician, you can call [phone] for a physician referral.   Increase activity slowly  As directed        Medication List    STOP taking these medications        acetaminophen 325 MG tablet  Commonly known as:  TYLENOL     multivitamin with minerals Tabs tablet      TAKE these medications       atorvastatin 10 MG tablet  Commonly known as:  LIPITOR  Take 10 mg by mouth daily.     B-D ULTRAFINE III SHORT PEN 31G X 8 MM Misc  Generic drug:  Insulin Pen Needle     diltiazem 60 MG tablet  Commonly known as:  CARDIZEM  Take 1 tablet (60 mg total) by mouth every 8 (eight) hours.     insulin detemir 100 UNIT/ML injection  Commonly known as:  LEVEMIR  Inject 8 Units into the skin daily with breakfast.     levETIRAcetam 500 MG tablet  Commonly known as:  KEPPRA  Take 1 tablet (500 mg total) by mouth 2 (two) times daily.     magnesium oxide 400 (241.3 MG) MG tablet  Commonly known as:  MAG-OX  Take 2 tablets (800 mg total) by mouth 2 (two) times daily.     metFORMIN 500 MG tablet  Commonly known as:  GLUCOPHAGE  Take 1 tablet by mouth Twice daily with meals.     metoprolol tartrate 25 MG tablet  Commonly known as:  LOPRESSOR  Take 25 mg by mouth 2 (two) times daily.     omeprazole 40 MG capsule  Commonly known as:  PRILOSEC  Take 40 mg by mouth 2 (two) times daily.     sitaGLIPtin 25 MG tablet  Commonly known as:  JANUVIA  Take 50 mg by mouth daily.     SUMAtriptan 25 MG tablet  Commonly known as:  IMITREX  Take 25 mg by mouth every 2 (two) hours as needed for migraine.     vitamin C 500 MG tablet  Commonly known as:  ASCORBIC ACID  Take 500 mg by mouth daily.       Allergies  Allergen Reactions  . Barbiturates     Becomes restless.  Also with all "strong" medications like sleeping pills  . Latex Itching       Follow-up Information   Follow up with LITTLE,JAMES, MD. Schedule an appointment as soon as possible for a visit in 1 week.   Specialty:  Family Medicine   Contact information:   [address] [place] [address] [phone]        The results of significant diagnostics from this hospitalization  (including imaging, microbiology, ancillary and laboratory) are listed below for reference.    Significant Diagnostic Studies: Dg Chest 2 View  07/01/2013   *RADIOLOGY REPORT*  Clinical Data: Increasing white blood cell count.  Aspiration pneumonia.  Infection.  CHEST - 2 VIEW  Comparison: 06/28/2013 and multiple priors dating back to 08/28/2012.  Findings: Asymmetric right pleural apical scarring is chronic. There is no airspace disease.  Chronic bronchitic changes are present.  The cardiopericardial silhouette and mediastinal contours are within normal limits.  Monitoring leads are projected over the chest.  IMPRESSION: No active cardiopulmonary disease.  Chronic bronchitic changes in both lower lobes.   Original Report Authenticated By: Andreas Newport, M.D.   Ct Head Wo Contrast  06/25/2013   CLINICAL DATA:  Seizures.  History of CVA.  EXAM: CT HEAD WITHOUT CONTRAST  TECHNIQUE: Contiguous axial images were obtained from the base of the skull through the vertex without intravenous contrast.  COMPARISON:  06/22/2013  FINDINGS: The patient's known remote hemorrhagic infarction in the right posterior medial temporal lobe is only apparent as subtle hypodensity below the occipital horn of the right lateral ventricle on CT, and difficult to separate out from the underlying mild chronic ischemic microvascular white matter disease.  Otherwise, the brainstem, cerebellum, cerebral peduncles, thalamus, basal ganglia, basilar cisterns, and ventricular system appear within normal limits. No intracranial hemorrhage, mass lesion, or acute CVA. Mild chronic right sphenoid sinusitis.  IMPRESSION: 1. No acute intracranial findings. 2. 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 3. Periventricular white matter and corona radiata hypodensities favor chronic ischemic microvascular white matter disease.   Electronically Signed   By: Herbie Baltimore   On: 06/25/2013 08:59   Ct Head Wo  Contrast  06/22/2013   *RADIOLOGY REPORT*  Clinical Data: Headache  CT HEAD WITHOUT CONTRAST  Technique:  Contiguous axial images were obtained from the base of the skull through the vertex without contrast.  Comparison: MRI 12/12/2012.  Findings: No acute intracranial hemorrhage.  No focal mass lesion. No CT evidence of acute infarction.   No midline shift or mass effect.  No hydrocephalus.  Basilar cisterns are patent.  There is mild periventricular white matter hypodensities.  Mild encephalomalacia within the right occipital lobe. These findings are similar to comparison MRI.  Paranasal sinuses and mastoid air cells are clear.  Orbits are normal.  IMPRESSION:  1.  No acute intracranial findings. 2. 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   Original Report Authenticated By: Genevive Bi, M.D.   [name]  06/22/2013   *RADIOLOGY REPORT*  Clinical Data: Headache.  Left visual field cut.  MRI HEAD WITHOUT CONTRAST  Technique:  Multiplanar, multiecho pulse sequences of the brain and surrounding structures were obtained according to standard protocol without intravenous contrast.  Comparison: Head CT same day.  MRI 12/12/2012.  Findings: Diffusion imaging does not show any acute or subacute infarction.  The brainstem and cerebellum are unremarkable.  The cerebral hemispheres show moderate chronic small vessel change affecting the deep and subcortical white matter. There is an old hemorrhagic infarction in the right posteromedial temporal lobe. This was present previously and could explain the patient's symptoms.  No mass lesion, hemorrhage, hydrocephalus or extra-axial collection.  No pituitary mass.  No inflammatory sinus disease. There is a left nasal maxillary cyst which could emanate from a dental root.  IMPRESSION: No acute infarction.  Moderate chronic small vessel changes of the cerebral hemispheric white  matter. Old  hemorrhagic infarction in the right posteromedial temporal lobe that could effect the optic radiations on the right.   Original Report Authenticated By: Paulina Fusi, M.D.   Dg Chest Port 1 View  07/02/2013   *RADIOLOGY REPORT*  Clinical Data: Status post right-sided central line placement  PORTABLE CHEST - 1 VIEW  Comparison: 07/01/2013  Findings: A new right jugular central line  is noted.  No pneumothorax is seen.  Catheter tip is noted at the cavoatrial junction.  The lungs are clear.  Cardiac shadow is stable.  IMPRESSION: No evidence of pneumothorax following central line placement.   Original Report Authenticated By: Alcide Clever, M.D.   Dg Chest Port 1 View  06/28/2013   *RADIOLOGY REPORT*  Clinical Data: Seizures, suspected aspiration event  PORTABLE CHEST - 1 VIEW  Comparison: Prior chest x-ray 06/25/2013  Findings: Single frontal view of the chest demonstrates no significant new airspace disease.  Inspiratory volumes are slightly lower and there may be minimal patchy opacity in the right base. Cardiac and mediastinal contours remain within normal limits. Atherosclerotic calcifications noted in the transverse aorta. Chronic central bronchitic changes are similar compared to prior. No pneumothorax.  No acute osseous abnormality.  IMPRESSION:  Slightly lower inspiratory volumes with trace right basilar opacity which may reflect atelectasis or minimal infiltrate.   Original Report Authenticated By: Malachy Moan, M.D.   Dg Chest Port 1 View  06/25/2013   CLINICAL DATA:  Fever.  EXAM: PORTABLE CHEST - 1 VIEW  COMPARISON:  12/11/2012.  FINDINGS: Normal sized heart. Clear lungs. The lungs remain hyperexpanded. Unremarkable bones.  IMPRESSION: No acute abnormality. Stable changes of COPD.   Electronically Signed   By: Gordan Payment   On: 06/25/2013 19:02    Microbiology: Recent Results (from the past 240 hour(s))  CULTURE, BLOOD (ROUTINE X 2)     Status: None   Collection Time     07/01/13  8:45 AM      Result Value Range Status   Specimen Description BLOOD RIGHT ARM   Final   Special Requests BOTTLES DRAWN AEROBIC AND ANAEROBIC 10 ML   Final   Culture  Setup Time     Final   Value: 07/01/2013 15:35     Performed at [facility]   Culture     Final   Value: NO GROWTH 5 DAYS     Performed at [facility]   Report Status 07/07/2013 FINAL   Final  CULTURE, BLOOD (ROUTINE X 2)     Status: None   Collection Time    07/01/13  9:00 AM      Result Value Range Status   Specimen Description BLOOD LEFT ARM   Final   Special Requests BOTTLES DRAWN AEROBIC AND ANAEROBIC 10 ML   Final   Culture  Setup Time     Final   Value: 07/01/2013 15:35     Performed at [facility]   Culture     Final   Value: NO GROWTH 5 DAYS     Performed at [facility]   Report Status 07/07/2013 FINAL   Final  URINE CULTURE     Status: None   Collection Time    07/01/13  5:44 PM      Result Value Range Status   Specimen Description URINE, RANDOM   Final   Special Requests NONE   Final   Culture  Setup Time     Final   Value: 07/01/2013 18:07     Performed at [facility] Count     Final   Value: NO GROWTH     Performed at [facility]   Culture     Final   Value: NO GROWTH     Performed at [facility]   Report Status 07/02/2013 FINAL   Final     Labs:  Basic Metabolic Panel:  Recent Labs Lab 07/04/13 0500 07/04/13 2340 07/05/13 0515 07/06/13 0520 07/07/13 0545 07/08/13 0450  NA 141  --  137 139 142 143  K 3.6 4.0 3.8 4.0 3.6 3.6  CL 99  --  99 100 101 103  CO2 30  --  30 30 30 29   GLUCOSE 106*  --  343* 357* 197* 138*  BUN 18  --  19 17 18 15   CREATININE 0.99  --  1.05 1.02 0.88 0.93  CALCIUM 7.9*  --  7.9* 7.9* 7.8* 7.5*  MG 2.2 1.9 2.0 1.1* 0.7* 0.7*  PHOS 4.3  4.4 4.0 4.8* 4.7* 5.3* 4.6   Liver Function Tests:  Recent Labs Lab 07/04/13 0500 07/05/13 0515 07/06/13 0520 07/07/13 0545  07/08/13 0450  AST 30 25 22 22 24   ALT 18 17 17 15 15   ALKPHOS 89 108 122* 113 105  BILITOT 0.3 0.3 0.5 0.5 0.4  PROT 5.9* 5.6* 5.7* 5.9* 5.8*  ALBUMIN 2.1* 2.0* 2.2* 2.3* 2.2*   No results found for this basename: LIPASE, AMYLASE,  in the last 168 hours No results found for this basename: AMMONIA,  in the last 168 hours CBC:  Recent Labs Lab 07/04/13 0500 07/05/13 0515 07/06/13 0520 07/07/13 0545 07/08/13 0450  WBC 9.4 9.1 8.5 10.3 9.7  NEUTROABS 6.4 6.5 4.8 6.5 5.5  HGB 9.2* 8.7* 8.8* 9.1* 8.6*  HCT 27.5* 26.6* 27.1* 27.5* 26.3*  MCV 96.2 96.7 97.5 97.2 97.8  PLT 257 264 318 333 369   Cardiac Enzymes: No results found for this basename: CKTOTAL, CKMB, CKMBINDEX, TROPONINI,  in the last 168 hours BNP: BNP (last 3 results) No results found for this basename: PROBNP,  in the last 8760 hours CBG:  Recent Labs Lab 07/07/13 0736 07/07/13 1133 07/07/13 1557 07/07/13 2020 07/08/13 0638  GLUCAP 209* 301* 168* 160* 179*       Signed:  Rhetta Mura  [facility] 07/08/2013, 10:48 AM

## 2013-07-08 NOTE — Progress Notes (Signed)
MEDICATION RELATED CONSULT NOTE - INITIAL   Pharmacy Consult for Magnesium Indication: Hypomagnesemia  Allergies  Allergen Reactions  . Barbiturates     Becomes restless.  Also with all "strong" medications like sleeping pills  . Latex Itching    Patient Measurements: Height: 5\' 5"  (165.1 cm) Weight: 110 lb 14.4 oz (50.304 kg) IBW/kg (Calculated) : 57  Vital Signs: Temp: 98.2 F (36.8 C) (09/05 0922) Temp src: Oral (09/05 0922) BP: 100/44 mmHg (09/05 0925) Pulse Rate: 125 (09/05 0925) Intake/Output from previous day: 09/04 0701 - 09/05 0700 In: 600 [P.O.:600] Out: -  Intake/Output from this shift:    Labs:  Recent Labs  07/06/13 0520 07/07/13 0545 07/08/13 0450  WBC 8.5 10.3 9.7  HGB 8.8* 9.1* 8.6*  HCT 27.1* 27.5* 26.3*  PLT 318 333 369  CREATININE 1.02 0.88 0.93  MG 1.1* 0.7* 0.7*  PHOS 4.7* 5.3* 4.6  ALBUMIN 2.2* 2.3* 2.2*  PROT 5.7* 5.9* 5.8*  AST 22 22 24   ALT 17 15 15   ALKPHOS 122* 113 105  BILITOT 0.5 0.5 0.4   Estimated Creatinine Clearance: 43.4 ml/min (by C-G formula based on Cr of 0.93).  Assessment: 56 YOF admitted on 8/23 with recurrent seizures. Her magnesium has been low, and she has received 13 g of IV magnesium sulfate on 8/28 - 9/1, She has been on PO mag-ox 800mg  bid since 9/4, but mag dropped again in the past 2 days, current Mg level is only 0.7, pt. Received 1g of magnesium IV this morning.  Goal of Therapy:  Correcting Hypomagnesemia  Plan:  - Will give another 2g MgSO4 IV today - f/u daily Mg level, and replace again if needed.  Bayard Hugger, PharmD, BCPS  Clinical Pharmacist  Pager: [phone]   07/08/2013,9:46 AM

## 2013-07-08 NOTE — Progress Notes (Signed)
CRITICAL VALUE ALERT  Critical value received:  Magnesium level : 0.7  Date of notification:  07/08/2013  Time of notification:  0552  Critical value read back:yes  Nurse who received alert:  Gerri Lins  MD notified (1st page):  Claiborne Billings. T/NP  Time of first page:  0553  MD notified (2nd page):  Time of second page:  Responding MD:  Callahan/NP  Time MD responded:  [phone]

## 2013-07-08 NOTE — Progress Notes (Signed)
FOLLOW-UP/CONSULT  DOCUMENTATION CODES Per approved criteria  -Severe malnutrition in the context of chronic illness -Underweight   INTERVENTION: Recommend pharmacy consult for repletion of magnesium Will add oral multivitamin Continue Carb Mod Low diet and Magic Cups TID  NUTRITION DIAGNOSIS: Malnutrition related to inadequate oral intake as evidenced by severe muscle loss and intake </= 75% of estimated energy intake for >/= 1 month. Ongoing.   Goal: Intake to meet >90% of estimated nutrition needs. Improving.   Monitor:  PO intake, labs, weight trend.  ASSESSMENT: Patient was admitted on 8/23 with recurrent seizures. Patient was in the ED on 8/20 with a headache lasting several days; MRI of the brain was unremarkable at that time. CT of head on this admission showed known remote hemorrhagic infarct, without acute changes.Keppra was increased to 500 mg BID. S/P lumbar puncture 8/23; CSF was unremarkable with no signs of acute meningitis. Aspiration PNA suspected.   RD consulted for low Magnesium again, question if this is ongoing refeeding syndrome. Given that magnesium was repleted and was WNL for several days before dropping again and Phos.K+ remain WNL, do not think this is refeeding at this point. Pt denies diarrhea, so doubt she has increased GI losses. Pt is eating well, 100% meals, so intake is likely adequate. Possibilities could be from malabsorption, but with out diarrhea or other GI symptoms this seems unlikely. Could also be related to renal function? Question if pt intracellular magnesium was significant depleted, would serum levels accurately correlate with intracellular levels?  Recommend pharmacy consult for management.   Height: Ht Readings from Last 1 Encounters:  06/25/13 5\' 5"  (1.651 m)    Weight: Wt Readings from Last 1 Encounters:  07/08/13 110 lb 14.4 oz (50.304 kg)  06/25/13 100 lb (ADMISSION WEIGHT)  BMI:  Body mass index is 18.45 kg/(m^2).  Underweight  Estimated Nutritional Needs: Kcal: 1400-1600 Protein: 70-80 gm Fluid: 1.4-1.6 L  Skin: no problems  Diet Order: Carb Control Low    Intake/Output Summary (Last 24 hours) at 07/08/13 0854 Last data filed at 07/08/13 0300  Gross per 24 hour  Intake    360 ml  Output      0 ml  Net    360 ml    Last BM: 9/3  Labs:   Recent Labs Lab 07/06/13 0520 07/07/13 0545 07/08/13 0450  NA 139 142 143  K 4.0 3.6 3.6  CL 100 101 103  CO2 30 30 29   BUN 17 18 15   CREATININE 1.02 0.88 0.93  CALCIUM 7.9* 7.8* 7.5*  MG 1.1* 0.7* 0.7*  PHOS 4.7* 5.3* 4.6  GLUCOSE 357* 197* 138*    CBG (last 3)   Recent Labs  07/07/13 1557 07/07/13 2020 07/08/13 0638  GLUCAP 168* 160* 179*    Scheduled Meds: . diltiazem  60 mg Oral Q8H  . insulin aspart  0-5 Units Subcutaneous QHS  . insulin aspart  0-9 Units Subcutaneous TID WC  . insulin detemir  15 Units Subcutaneous q morning - 10a  . levETIRAcetam  500 mg Oral BID  . magnesium oxide  800 mg Oral BID  . phosphorus  500 mg Oral QID  . sodium chloride  3 mL Intravenous Q12H    Continuous Infusions: . sodium chloride 125 mL/hr at 06/30/13 1307  . sodium chloride 75 mL/hr at 07/07/13 1025    Clarene Duke RD, LDN Pager [phone] After Hours pager [phone]

## 2013-07-08 NOTE — Progress Notes (Signed)
Attempted to call report to [facility] but was placed on hold multiple times and unable to successfully give report.

## 2013-08-24 ENCOUNTER — Ambulatory Visit (INDEPENDENT_AMBULATORY_CARE_PROVIDER_SITE_OTHER): Payer: Federal, State, Local not specified - PPO | Admitting: Nurse Practitioner

## 2013-08-24 ENCOUNTER — Encounter (INDEPENDENT_AMBULATORY_CARE_PROVIDER_SITE_OTHER): Payer: Self-pay

## 2013-08-24 ENCOUNTER — Encounter: Payer: Self-pay | Admitting: Nurse Practitioner

## 2013-08-24 VITALS — BP 114/62 | HR 81 | Ht 65.5 in | Wt 103.0 lb

## 2013-08-24 DIAGNOSIS — I619 Nontraumatic intracerebral hemorrhage, unspecified: Secondary | ICD-10-CM

## 2013-08-24 DIAGNOSIS — G40309 Generalized idiopathic epilepsy and epileptic syndromes, not intractable, without status epilepticus: Secondary | ICD-10-CM

## 2013-08-24 DIAGNOSIS — G40409 Other generalized epilepsy and epileptic syndromes, not intractable, without status epilepticus: Secondary | ICD-10-CM

## 2013-08-24 MED ORDER — LEVETIRACETAM 500 MG PO TABS: 500.0000 mg | ORAL_TABLET | Freq: Two times a day (BID) | ORAL | Status: DC

## 2013-08-24 NOTE — Patient Instructions (Addendum)
Pt to continue Keppra at 500mg  twice daily B/P reading good today Keep well hydrated F/U in 6 months

## 2013-08-24 NOTE — Progress Notes (Signed)
[facility]  PATIENT: [name] [name] DOB: [birth year]   REASON FOR VISIT: Followup seizure disorder and history of small right parietal occipital hemorrhage    HISTORY OF PRESENT ILLNESS: [name] [name], 72 year old white female returns for followup. She had a recent admission 06/25/2013 for persistent fevers. In addition she had 4 seizures prior to admission and a severe headache. LP was done and negative for meningitis. Keppra dosage was increased to 500 mg twice daily she has not had further seizure activity since her discharge. She is currently getting home health physical therapy. 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    HISTORY: She has a history of a small right parietal occipital hemorrhage without ventricular extension, hydrocephalus or mass effect. She had a seizure, an EEG showed lateralized epileptiform discharges and she was started on Keppra. She also has a partial left-sided visual field cut on exam, she has done well since discharge and is back at her home . Her MRI 11/25/2012 shows expected evolutionary changes in the right posterior medial temporal lobe when compared to prior MRI scan. There are mild changes of chronic microvascular ischemia and generalized cerebral atrophy which are unchanged.    REVIEW OF SYSTEMS: Full 14 system review of systems performed and notable only for:  Constitutional: N/A  Cardiovascular: N/A  Ear/Nose/Throat: Hearing loss  Skin: N/A  Eyes: N/A  Respiratory: N/A  Gastroitestinal: N/A  Hematology/Lymphatic: N/A  Endocrine: N/A Musculoskeletal:N/A  Allergy/Immunology: N/A  Neurological: Memory loss, confusion, headache, seizure Psychiatric: Anxiety   ALLERGIES: Allergies  Allergen Reactions  . Barbiturates     Becomes restless.  Also with all "strong" medications like sleeping pills  . Latex Itching    HOME  MEDICATIONS: Outpatient Prescriptions Prior to Visit  Medication Sig Dispense Refill  . atorvastatin (LIPITOR) 10 MG tablet Take 10 mg by mouth daily.       . B-D ULTRAFINE III SHORT PEN 31G X 8 MM MISC       . diltiazem (CARDIZEM) 60 MG tablet Take 1 tablet (60 mg total) by mouth every 8 (eight) hours.  90 tablet  0  . insulin detemir (LEVEMIR) 100 UNIT/ML injection Inject 8 Units into the skin daily with breakfast.       . levETIRAcetam (KEPPRA) 500 MG tablet Take 1 tablet (500 mg total) by mouth 2 (two) times daily.  60 tablet  0  . Magnesium 500 MG TABS Take 1 tablet (500 mg total) by mouth 3 (three) times daily.  90 tablet  0  . metFORMIN (GLUCOPHAGE) 500 MG tablet Take 1 tablet by mouth Twice daily with meals.      . metoprolol tartrate (LOPRESSOR) 25 MG tablet Take 25 mg by mouth 2 (two) times daily.      Marland Kitchen omeprazole (PRILOSEC) 40 MG capsule Take 40 mg by mouth 2 (two) times daily.       . sitaGLIPtin (JANUVIA) 25 MG tablet Take 50 mg by mouth daily.      . vitamin C (ASCORBIC ACID) 500 MG tablet Take 500 mg by mouth daily.      . SUMAtriptan (IMITREX) 25 MG tablet Take 25 mg by mouth every 2 (two) hours as needed for migraine.       No facility-administered medications prior to visit.    PAST MEDICAL HISTORY: Past Medical History  Diagnosis Date  . Diabetes mellitus   . GERD (gastroesophageal reflux disease)   . Seizures   .  Cataract   . Anxiety   . Stroke 2013    Secondary to acute right posterior temporo-occipital intra-axial hemorrhage    PAST SURGICAL HISTORY: Past Surgical History  Procedure Laterality Date  . 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 Cancer Mother   . Cancer Father   . Diabetes Paternal Grandmother     SOCIAL HISTORY: History   Social  History  . Marital Status: Married    Spouse Name: N/A    Number of Children: N/A  . Years of Education: N/A   Occupational History  . Retired    Social History Main Topics  . Smoking status: Never Smoker   . Smokeless tobacco: Never Used  . Alcohol Use: No     Comment: patient drinks caffeinated drinks.   . Drug Use: No  . Sexual Activity: Not on file   Other Topics Concern  . Not on file   Social History Narrative   Patient lives at home with her husband and daughter and has a high school education.      PHYSICAL EXAM  Filed Vitals:   08/24/13 1410  BP: 114/62  Pulse: 81  Height: 5' 5.5" (1.664 m)  Weight: 103 lb (46.72 kg)   Body mass index is 16.87 kg/(m^2).  Generalized: Well developed, in no acute distress  Head: normocephalic and atraumatic,. Oropharynx benign  Neck: Supple, no carotid bruits  Cardiac: Regular rate rhythm, no murmurs    Neurological examination   Mentation: Alert oriented to time, place, history taking. Follows all commands speech and language fluent  Cranial nerve II-XII: Pupils were equal round reactive to light extraocular movements were full, visual field with partial left homonymous hemianopsia to  confrontational test. Facial sensation and strength were normal. Hard of hearing. Uvula tongue midline. head turning and shoulder shrug and were normal and symmetric.Tongue protrusion into cheek strength was normal. Motor: normal bulk and tone, full strength in the BUE, BLE, fine finger movements normal, no pronator drift. No focal weakness Sensory: normal and symmetric to light touch, pinprick, and  vibration  Coordination: finger-nose-finger,  no dysmetria Reflexes: Brachioradialis 2/2, biceps 2/2, triceps 2/2, patellar 2/2, Achilles 2/2, plantar responses were flexor bilaterally. Gait and Station: Rising up from seated position without assistance, normal stance, without trunk ataxia, moderate stride, good arm swing, smooth turning, able to  perform tiptoe, and heel walking without difficulty. Mildly unsteady with tandem, Romberg negative  DIAGNOSTIC DATA (LABS, IMAGING, TESTING) - I reviewed patient records, labs, notes, testing and imaging myself where available.  Lab Results  Component Value Date   WBC 9.7 07/08/2013   HGB 8.6* 07/08/2013   HCT 26.3* 07/08/2013   MCV 97.8 07/08/2013   PLT 369 07/08/2013      Component Value Date/Time   NA 143 07/08/2013 0450   K 3.6 07/08/2013 0450   CL 103 07/08/2013 0450   CO2 29 07/08/2013 0450   GLUCOSE 138* 07/08/2013 0450   BUN 15 07/08/2013 0450   CREATININE 0.93 07/08/2013 0450   CREATININE 1.34* 08/16/2012 1614   CALCIUM 7.5* 07/08/2013 0450   PROT 5.8* 07/08/2013 0450   ALBUMIN 2.2* 07/08/2013 0450   AST 24 07/08/2013 0450   ALT 15 07/08/2013 0450   ALKPHOS 105 07/08/2013 0450   BILITOT 0.4  07/08/2013 0450   GFRNONAA 60* 07/08/2013 0450   GFRAA 69* 07/08/2013 0450   Lab Results  Component Value Date   CHOL 129 06/29/2013   HDL 86 06/29/2013   LDLCALC 27 06/29/2013   TRIG 82 06/29/2013   CHOLHDL 1.5 06/29/2013   Lab Results  Component Value Date   HGBA1C 8.6* 06/29/2013    Lab Results  Component Value Date   TSH 0.365 07/02/2013      ASSESSMENT AND PLAN  72 y.o. year old female  has a past medical history of Diabetes mellitus; GERD (gastroesophageal reflux disease); Seizures; Cataract; Anxiety; and Stroke (2013). followup after recent hospital admission in August for severe headache and seizures. LP negative for meningitis, Keppra dose increase to 500 mg twice daily without further seizure events since discharge. MRI of the brain without acute change  Pt to continue Keppra at 500mg  twice daily will reill B/P reading good today Keep well hydrated F/U in 6 months  Nilda Riggs, [facility], [facility], APRN  [facility] [address], [address] [place], [place] [address] [phone]

## 2013-11-24 ENCOUNTER — Other Ambulatory Visit: Payer: Self-pay

## 2013-11-24 DIAGNOSIS — Z1231 Encounter for screening mammogram for malignant neoplasm of breast: Secondary | ICD-10-CM

## 2013-12-13 ENCOUNTER — Ambulatory Visit
Admission: RE | Admit: 2013-12-13 | Discharge: 2013-12-13 | Disposition: A | Discharge status: Home / Self Care | Payer: Federal, State, Local not specified - PPO | Source: Ambulatory Visit

## 2013-12-13 DIAGNOSIS — Z1231 Encounter for screening mammogram for malignant neoplasm of breast: Secondary | ICD-10-CM

## 2014-02-22 ENCOUNTER — Ambulatory Visit: Payer: Federal, State, Local not specified - PPO | Admitting: Neurology

## 2014-02-22 ENCOUNTER — Encounter (INDEPENDENT_AMBULATORY_CARE_PROVIDER_SITE_OTHER): Payer: Self-pay

## 2014-05-30 ENCOUNTER — Ambulatory Visit (HOSPITAL_COMMUNITY): Payer: Federal, State, Local not specified - PPO

## 2015-02-20 ENCOUNTER — Other Ambulatory Visit: Payer: Self-pay

## 2015-02-20 DIAGNOSIS — Z1231 Encounter for screening mammogram for malignant neoplasm of breast: Secondary | ICD-10-CM

## 2015-03-08 ENCOUNTER — Ambulatory Visit
Admission: RE | Admit: 2015-03-08 | Discharge: 2015-03-08 | Disposition: A | Discharge status: Home / Self Care | Payer: Federal, State, Local not specified - PPO | Source: Ambulatory Visit

## 2015-03-08 DIAGNOSIS — Z1231 Encounter for screening mammogram for malignant neoplasm of breast: Secondary | ICD-10-CM

## 2015-03-12 ENCOUNTER — Emergency Department (HOSPITAL_COMMUNITY)
Admission: EM | Admit: 2015-03-12 | Discharge: 2015-03-12 | Disposition: A | Discharge status: Home / Self Care | Payer: Federal, State, Local not specified - PPO | Attending: Emergency Medicine | Admitting: Emergency Medicine

## 2015-03-12 ENCOUNTER — Encounter (HOSPITAL_COMMUNITY): Payer: Self-pay | Admitting: *Deleted

## 2015-03-12 DIAGNOSIS — S90862A Insect bite (nonvenomous), left foot, initial encounter: Secondary | ICD-10-CM | POA: Diagnosis present

## 2015-03-12 DIAGNOSIS — Z8673 Personal history of transient ischemic attack (TIA), and cerebral infarction without residual deficits: Secondary | ICD-10-CM | POA: Diagnosis not present

## 2015-03-12 DIAGNOSIS — W57XXXA Bitten or stung by nonvenomous insect and other nonvenomous arthropods, initial encounter: Secondary | ICD-10-CM | POA: Diagnosis not present

## 2015-03-12 DIAGNOSIS — G40909 Epilepsy, unspecified, not intractable, without status epilepticus: Secondary | ICD-10-CM | POA: Diagnosis not present

## 2015-03-12 DIAGNOSIS — Z79899 Other long term (current) drug therapy: Secondary | ICD-10-CM | POA: Insufficient documentation

## 2015-03-12 DIAGNOSIS — Z794 Long term (current) use of insulin: Secondary | ICD-10-CM | POA: Insufficient documentation

## 2015-03-12 DIAGNOSIS — Z8659 Personal history of other mental and behavioral disorders: Secondary | ICD-10-CM | POA: Insufficient documentation

## 2015-03-12 DIAGNOSIS — K219 Gastro-esophageal reflux disease without esophagitis: Secondary | ICD-10-CM | POA: Insufficient documentation

## 2015-03-12 DIAGNOSIS — Y999 Unspecified external cause status: Secondary | ICD-10-CM | POA: Diagnosis not present

## 2015-03-12 DIAGNOSIS — E119 Type 2 diabetes mellitus without complications: Secondary | ICD-10-CM | POA: Diagnosis not present

## 2015-03-12 DIAGNOSIS — Y939 Activity, unspecified: Secondary | ICD-10-CM | POA: Diagnosis not present

## 2015-03-12 DIAGNOSIS — Z91040 Latex allergy status: Secondary | ICD-10-CM | POA: Insufficient documentation

## 2015-03-12 DIAGNOSIS — H269 Unspecified cataract: Secondary | ICD-10-CM | POA: Diagnosis not present

## 2015-03-12 DIAGNOSIS — Y929 Unspecified place or not applicable: Secondary | ICD-10-CM | POA: Insufficient documentation

## 2015-03-12 NOTE — Discharge Instructions (Signed)
Centipede Bite and Millipede Reaction [name] [name], see your primary care physician within 3 days for close follow-up. If any symptoms worsen including redness, pain, swelling, or if you have concerns for worsening infection come back to the emergency department immediately. Thank you. Centipede bites are associated with pain, localized redness (erythema), and swelling. The bite can be recognized by two small puncture wounds from the jaws of the centipede. Redness and soreness (inflammation) of the lymph channels (lymphangitis), lymph nodes (lymphadenitis), and areas of complete tissue death (necrosis) are uncommon complications.  Millipedes do not bite. But when handled, they do secrete a toxin. The toxin causes local skin reactions. These can be severe. They can progress to:  Inflammation and redness (erythema).  Blister formation (vesiculation).  Tissue death (necrosis). TREATMENT  Local steroids may be applied to the affected area. Antibiotic medicines may be prescribed if a bacterial infection develops in the bite. You might need a tetanus shot if:   You cannot remember when you had your last tetanus shot.  You have never had a tetanus shot.  The injury broke your skin. If you got a tetanus shot, your arm may swell and get red and warm to the touch at the shot site. This is common and not a problem.  HOME CARE INSTRUCTIONS  Keep the affected area clean and dry. Wash the area thoroughly with soap and water.  Only take or apply medicines as directed by your caregiver.  Put ice or cool compresses on the bite area.  Put ice in a plastic bag.  Place a towel between your skin and the bag.  Leave the ice on for 20 minutes, 4 times a day for the first 2 to 3 days, or as directed.  Watch the affected area closely for worsening pain, swelling, redness, or pus. SEEK IMMEDIATE MEDICAL CARE IF:   You develop redness, swelling, or increasing pain in the affected area.  Pus develops in  the affected area.  A foul smell comes from the wound or affected area.  Your eyes come into contact with toxic secretions.  You have a fever or persistent symptoms for more than 2-3 days.  You have a fever and your symptoms suddenly get worse. Document Released: 10/17/2000 Document Revised: 10/06/2012 Document Reviewed: 06/07/2008 [facility] Patient Information 2015 [place], [place]. This information is not intended to replace advice given to you by your health care provider. Make sure you discuss any questions you have with your health care provider.

## 2015-03-12 NOTE — ED Notes (Signed)
The pt was bitten byt a worm tonight while she was getting into her bed pain in her lt lateral foot

## 2015-03-12 NOTE — ED Provider Notes (Signed)
CSN: [id number]     Arrival date & time 03/12/15  0042 History   First MD Initiated Contact with Patient 03/12/15 0114    This chart was scribed for [name] Balls, MD by Terressa Koyanagi, ED Scribe. This patient was seen in room A01C/A01C and the patient's care was started at 1:29 AM.  Chief Complaint  Patient presents with  . Insect Bite    HPI PCP: [name], MD HPI Comments: [name] [name] is a 74 y.o. female, accompanied by her husband, with PMH noted below, who presents to the Emergency Department complaining of an insect bite with associated pain to her left foot onset at 11:30PM when she was getting ready to get into bed and stepped on an insect that looked like a centipede.   Past Medical History  Diagnosis Date  . Diabetes mellitus   . GERD (gastroesophageal reflux disease)   . Seizures   . Cataract   . Anxiety   . Stroke 2013    Secondary to acute right posterior temporo-occipital intra-axial hemorrhage   Past Surgical History  Procedure Laterality Date  . 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 Cancer Mother   . Cancer Father   . Diabetes Paternal Grandmother    History  Substance Use Topics  . Smoking status: Never Smoker   . Smokeless tobacco: Never Used  . Alcohol Use: No     Comment: patient drinks caffeinated drinks.    OB History    No data available     Review of Systems 10 Systems reviewed and all are negative for acute change except as noted in the HPI.  Allergies  Barbiturates and Latex  Home Medications   Prior to Admission medications   Medication Sig Start Date End Date Taking? Authorizing Provider  atorvastatin (LIPITOR) 10 MG tablet Take 10 mg by mouth daily.  02/11/13   Historical Provider, MD  B-D ULTRAFINE III SHORT PEN 31G X 8 MM MISC  12/07/12    Historical Provider, MD  diltiazem (CARDIZEM) 60 MG tablet Take 1 tablet (60 mg total) by mouth every 8 (eight) hours. 07/08/13   Nita Sells, MD  insulin detemir (LEVEMIR) 100 UNIT/ML injection Inject 8 Units into the skin daily with breakfast.  09/10/12   Ivan Anchors Love, PA-C  levETIRAcetam (KEPPRA) 500 MG tablet Take 1 tablet (500 mg total) by mouth 2 (two) times daily. 08/24/13   Otilio Jefferson, NP  Magnesium 500 MG TABS Take 1 tablet (500 mg total) by mouth 3 (three) times daily. 07/08/13   Nita Sells, MD  metFORMIN (GLUCOPHAGE) 500 MG tablet Take 1 tablet by mouth Twice daily with meals. 10/05/12   Historical Provider, MD  metoprolol tartrate (LOPRESSOR) 25 MG tablet Take 25 mg by mouth 2 (two) times daily.    Historical Provider, MD  NOVOLOG FLEXPEN 100 UNIT/ML SOPN FlexPen  07/29/13   Historical Provider, MD  omeprazole (PRILOSEC) 40 MG capsule Take 40 mg by mouth 2 (two) times daily.     Historical Provider, MD  sitaGLIPtin (JANUVIA) 25 MG tablet Take 50 mg by mouth daily.    Historical Provider, MD  vitamin C (ASCORBIC ACID) 500 MG tablet Take 500 mg by mouth daily.    Historical Provider, MD   Triage Vitals: BP 139/59 mmHg  Pulse  58  Temp(Src) 98.2 F (36.8 C)  Resp 16  Wt 132 lb (59.875 kg)  SpO2 95% Physical Exam  Constitutional: She is oriented to person, place, and time. She appears well-developed and well-nourished. No distress.  HENT:  Head: Normocephalic and atraumatic.  Nose: Nose normal.  Mouth/Throat: Oropharynx is clear and moist. No oropharyngeal exudate.  Eyes: Conjunctivae and EOM are normal. Pupils are equal, round, and reactive to light. No scleral icterus.  Neck: Normal range of motion. Neck supple. No JVD present. No tracheal deviation present. No thyromegaly present.  Cardiovascular: Normal rate, regular rhythm and normal heart sounds.  Exam reveals no gallop and no friction rub.   No murmur heard. Pulmonary/Chest: Effort normal and breath sounds  normal. No respiratory distress. She has no wheezes. She exhibits no tenderness.  Abdominal: Soft. Bowel sounds are normal. She exhibits no distension and no mass. There is no tenderness. There is no rebound and no guarding.  Musculoskeletal: Normal range of motion. She exhibits no edema or tenderness.  Lymphadenopathy:    She has no cervical adenopathy.  Neurological: She is alert and oriented to person, place, and time. No cranial nerve deficit. She exhibits normal muscle tone.  Skin: Skin is warm and dry. No rash noted. No erythema. No pallor.  1cm circumfrencial area of erythema in left lateral mid foot. No warmth, no tenderness to palpation, no drainage.   Nursing note and vitals reviewed.   ED Course  Procedures (including critical care time) DIAGNOSTIC STUDIES: Oxygen Saturation is 95% on RA, adequate by my interpretation.    COORDINATION OF CARE: 1:32 AM-Discussed treatment plan which includes f/u with PCP in 3 days, OTC pain meds, and watching the affected area and return to the ED immediately if Sx worsen, with pt at bedside and pt agreed to plan.  Labs Review Labs Reviewed - No data to display  Imaging Review No results found.   EKG Interpretation None      MDM   Final diagnoses:  None   patient presents emergency department after she was bitten on her left foot. She states this is by a warm however upon further questioning she states that it had several small legs. This leads me to believe that it may have been a centipede or millipede.  Regardless, the area appears not to be acutely infected. There is small erythema only. I do not see any signs of injury or laceration or bite marks. There is no warmth or tenderness to palpation. Patient was advised to see her primary care physician within 3 days for close follow-up. Return precautions given for possible infection including worsening redness, pain, drainage. Patient appears in no acute distress, her vital signs remain  within her normal limits and she is safe for discharge.  I personally performed the services described in this documentation, which was scribed in my presence. The recorded information has been reviewed and is accurate.   [name] Balls, MD 03/12/15 [phone]

## 2015-04-27 ENCOUNTER — Encounter: Payer: Self-pay | Admitting: Internal Medicine

## 2015-05-24 ENCOUNTER — Ambulatory Visit (INDEPENDENT_AMBULATORY_CARE_PROVIDER_SITE_OTHER): Payer: Federal, State, Local not specified - PPO | Admitting: Neurology

## 2015-05-24 ENCOUNTER — Encounter: Payer: Self-pay | Admitting: Neurology

## 2015-05-24 VITALS — BP 121/62 | HR 67 | Ht 65.0 in | Wt 134.2 lb

## 2015-05-24 DIAGNOSIS — F22 Delusional disorders: Secondary | ICD-10-CM | POA: Insufficient documentation

## 2015-05-24 MED ORDER — LEVETIRACETAM 250 MG PO TABS: 250.0000 mg | ORAL_TABLET | Freq: Two times a day (BID) | ORAL | Status: DC

## 2015-05-24 NOTE — Progress Notes (Signed)
[facility]  PATIENT: [name] [name] DOB: May 24, 1941   REASON FOR VISIT: Followup seizure disorder and history of small right parietal occipital hemorrhage    HISTORY OF PRESENT ILLNESS: [name] [name], 74 year old white female returns for followup. She had a recent admission 06/25/2013 for persistent fevers. In addition she had 4 seizures prior to admission and a severe headache. LP was done and negative for meningitis. Keppra dosage was increased to 500 mg twice daily she has not had further seizure activity since her discharge. She is currently getting home health physical therapy. 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    HISTORY: She has a history of a small right parietal occipital hemorrhage without ventricular extension, hydrocephalus or mass effect. She had a seizure, an EEG showed lateralized epileptiform discharges and she was started on Keppra. She also has a partial left-sided visual field cut on exam, she has done well since discharge and is back at her home . Her MRI 11/25/2012 shows expected evolutionary changes in the right posterior medial temporal lobe when compared to prior MRI scan. There are mild changes of chronic microvascular ischemia and generalized cerebral atrophy which are unchanged.   Update 05/24/2015 : She returns for follow-up after last visit here and a half ago. She is not having recurrent seizures or stroke or TIA symptoms. Patient's husband however feels that she's had some paranoid behavior of symptoms the Keppra dose was increased. She suspects him of infidelity as well as at times she has some moderately hallucinations. She can be redirected. She's been having some pain in her hands off and on her neck which may be related to arthritis. She remains on Keppra 500 twice daily.  REVIEW OF SYSTEMS: Full 14 system review of systems performed and notable only for:     Fatigue, hearing loss, chest pain, leg swelling, frequency of urination, urgency, joint and back pain, headache, speech difficulty, tremors, confusion, nervousness, anxiety and itching  ALLERGIES: Allergies  Allergen Reactions  . Barbiturates     Becomes restless.  Also with all "strong" medications like sleeping pills  . Latex Itching    HOME MEDICATIONS: Outpatient Prescriptions Prior to Visit  Medication Sig Dispense Refill  . atorvastatin (LIPITOR) 10 MG tablet Take 10 mg by mouth daily.     . B-D ULTRAFINE III SHORT PEN 31G X 8 MM MISC     . diltiazem (CARDIZEM) 60 MG tablet Take 1 tablet (60 mg total) by mouth every 8 (eight) hours. 90 tablet 0  . insulin detemir (LEVEMIR) 100 UNIT/ML injection Inject 8 Units into the skin daily with breakfast.     . Magnesium 500 MG TABS Take 1 tablet (500 mg total) by mouth 3 (three) times daily. 90 tablet 0  . metoprolol tartrate (LOPRESSOR) 25 MG tablet Take 25 mg by mouth 2 (two) times daily.    Marland Kitchen NOVOLOG FLEXPEN 100 UNIT/ML SOPN FlexPen     . omeprazole (PRILOSEC) 40 MG capsule Take 40 mg by mouth 2 (two) times daily.     . vitamin C (ASCORBIC ACID) 500 MG tablet Take 500 mg by mouth daily.    Marland Kitchen levETIRAcetam (KEPPRA) 500 MG tablet Take 1 tablet (500 mg total) by mouth 2 (two) times daily. 60 tablet 6  . metFORMIN (GLUCOPHAGE) 500 MG tablet Take 1 tablet by mouth Twice daily with meals.    . sitaGLIPtin (JANUVIA) 25 MG tablet Take 50 mg by mouth  daily.     No facility-administered medications prior to visit.    PAST MEDICAL HISTORY: Past Medical History  Diagnosis Date  . Diabetes mellitus   . GERD (gastroesophageal reflux disease)   . Seizures   . Cataract   . Anxiety   . Stroke 2013    Secondary to acute right posterior temporo-occipital intra-axial hemorrhage    PAST SURGICAL HISTORY: Past Surgical History  Procedure Laterality Date  . 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 Cancer Mother   . Cancer Father   . Diabetes Paternal Grandmother     SOCIAL HISTORY: History   Social History  . Marital Status: Married    Spouse Name: N/A  . Number of Children: N/A  . Years of Education: N/A   Occupational History  . Retired    Social History Main Topics  . Smoking status: Never Smoker   . Smokeless tobacco: Never Used  . Alcohol Use: No     Comment: patient drinks caffeinated drinks.   . Drug Use: No  . Sexual Activity: Not on file   Other Topics Concern  . Not on file   Social History Narrative   Patient lives at home with her husband and daughter and has a high school education.      PHYSICAL EXAM  Filed Vitals:   05/24/15 1454  BP: 121/62  Pulse: 67  Height: 5\' 5"  (1.651 m)  Weight: 134 lb 3.2 oz (60.873 kg)   Body mass index is 22.33 kg/(m^2).  Generalized: Well developed, in no acute distress  Head: normocephalic and atraumatic,. Oropharynx benign  Neck: Supple, no carotid bruits  Cardiac: Regular rate rhythm, no murmurs    Neurological examination   Mentation: Alert oriented to time, place, history taking. Follows all commands speech and language fluent  Cranial nerve II-XII: Pupils were equal round reactive to light extraocular movements were full, visual field with partial left homonymous hemianopsia to  confrontational test. Facial sensation and strength were normal. Hard of hearing. Uvula tongue midline. head turning and shoulder shrug and were normal and symmetric.Tongue protrusion into cheek strength was normal. Motor: normal bulk and tone, full strength in the BUE, BLE, fine finger movements normal, no pronator drift. No focal weakness Sensory: normal and symmetric to light touch, pinprick, and  vibration  Coordination: finger-nose-finger,  no  dysmetria Reflexes: Brachioradialis 2/2, biceps 2/2, triceps 2/2, patellar 2/2, Achilles 2/2, plantar responses were flexor bilaterally. Gait and Station: Rising up from seated position without assistance, normal stance, without trunk ataxia, moderate stride, good arm swing, smooth turning, able to perform tiptoe, and heel walking without difficulty. Mildly unsteady with tandem, Romberg negative  DIAGNOSTIC DATA (LABS, IMAGING, TESTING) - I reviewed patient records, labs, notes, testing and imaging myself where available.  Lab Results  Component Value Date   WBC 9.7 07/08/2013   HGB 8.6* 07/08/2013   HCT 26.3* 07/08/2013   MCV 97.8 07/08/2013   PLT 369 07/08/2013      Component Value Date/Time   NA 143 07/08/2013 0450   K 3.6 07/08/2013 0450   CL 103 07/08/2013 0450   CO2 29 07/08/2013 0450   GLUCOSE 138* 07/08/2013 0450   BUN 15 07/08/2013 0450   CREATININE 0.93 07/08/2013 0450   CREATININE 1.34*  08/16/2012 1614   CALCIUM 7.5* 07/08/2013 0450   PROT 5.8* 07/08/2013 0450   ALBUMIN 2.2* 07/08/2013 0450   AST 24 07/08/2013 0450   ALT 15 07/08/2013 0450   ALKPHOS 105 07/08/2013 0450   BILITOT 0.4 07/08/2013 0450   GFRNONAA 60* 07/08/2013 0450   GFRAA 69* 07/08/2013 0450   Lab Results  Component Value Date   CHOL 129 06/29/2013   HDL 86 06/29/2013   LDLCALC 27 06/29/2013   TRIG 82 06/29/2013   CHOLHDL 1.5 06/29/2013   Lab Results  Component Value Date   HGBA1C 8.6* 06/29/2013    Lab Results  Component Value Date   TSH 0.365 07/02/2013      ASSESSMENT AND PLAN  74 y.o. year old female  has a past medical history of Diabetes mellitus; GERD (gastroesophageal reflux disease); Seizures; Cataract; Anxiety; and Stroke (2013). followup after recent hospital admission in August for severe headache and seizures. LP negative for meningitis, Keppra dose increase to 500 mg twice daily without further seizure events since discharge. MRI of the brain without acute change Plan :    I had a long discussion with the patient and husband regards to her remote stroke and seizures both of which appeared to be stable. Continue aspirin for stroke prevention and strict control of lipids with LDL cholesterol goal below 70 mg percent and hypertension with blood pressure goal below 130/90. Patient is having some paranoid behavior which perhaps related to increasing the dose of Keppra hence I recommend slow taper of Keppra to 250 mg in the morning and 500 at night for a month and if required reduce further to 250 twice daily if tolerated without breakthrough seizures. Patient has been advised to call me in case she had breakthrough seizures in that case we may need to change to an alternative seizure medication. She was asked to return for follow-up in 3 months or call earlier if necessary  Antony Contras, MD [facility] [address], [place], [place] [address] [phone]

## 2015-05-24 NOTE — Patient Instructions (Signed)
I had a long discussion with the patient and husband regards to her remote stroke and seizures both of which appeared to be stable. Continue aspirin for stroke prevention and strict control of lipids with LDL cholesterol goal below 70 mg percent and hypertension with blood pressure goal below 130/90. Patient is having some paranoid behavior which perhaps related to increasing the dose of Keppra hence I recommend slow taper of Keppra to 250 mg in the morning and 500 at night for a month and if required reduce further to 250 twice daily if tolerated without breakthrough seizures. Patient has been advised to call me in case she had breakthrough seizures in that case we may need to change to an alternative seizure medication. She was asked to return for follow-up in 3 months or call earlier if necessary

## 2015-06-13 ENCOUNTER — Ambulatory Visit (AMBULATORY_SURGERY_CENTER): Payer: Self-pay | Admitting: *Deleted

## 2015-06-13 VITALS — Ht 65.0 in | Wt 134.2 lb

## 2015-06-13 DIAGNOSIS — Z800 Family history of malignant neoplasm of digestive organs: Secondary | ICD-10-CM

## 2015-06-13 MED ORDER — MOVIPREP 100 G PO SOLR: ORAL | Status: DC

## 2015-06-13 NOTE — Progress Notes (Signed)
No egg or soy allergy  No anesthesia or intubation problems per pt  No diet medications taken  Registered in EMMI   

## 2015-06-14 ENCOUNTER — Encounter: Payer: Self-pay | Admitting: Internal Medicine

## 2015-06-28 ENCOUNTER — Encounter: Payer: Federal, State, Local not specified - PPO | Admitting: Internal Medicine

## 2015-07-03 ENCOUNTER — Ambulatory Visit (AMBULATORY_SURGERY_CENTER): Payer: Federal, State, Local not specified - PPO | Admitting: Internal Medicine

## 2015-07-03 ENCOUNTER — Encounter: Payer: Self-pay | Admitting: Internal Medicine

## 2015-07-03 VITALS — BP 134/65 | HR 67 | Temp 97.6°F | Resp 18 | Ht 65.0 in | Wt 134.0 lb

## 2015-07-03 DIAGNOSIS — Z1211 Encounter for screening for malignant neoplasm of colon: Secondary | ICD-10-CM

## 2015-07-03 DIAGNOSIS — D122 Benign neoplasm of ascending colon: Secondary | ICD-10-CM | POA: Diagnosis not present

## 2015-07-03 DIAGNOSIS — Z800 Family history of malignant neoplasm of digestive organs: Secondary | ICD-10-CM | POA: Diagnosis not present

## 2015-07-03 DIAGNOSIS — K635 Polyp of colon: Secondary | ICD-10-CM | POA: Diagnosis not present

## 2015-07-03 LAB — GLUCOSE, CAPILLARY
GLUCOSE-CAPILLARY: 226 mg/dL — AB (ref 65–99)
Glucose-Capillary: 209 mg/dL — ABNORMAL HIGH (ref 65–99)

## 2015-07-03 MED ORDER — SODIUM CHLORIDE 0.9 % IV SOLN: 500.0000 mL | INTRAVENOUS | Status: DC

## 2015-07-03 NOTE — Progress Notes (Signed)
Report to PACU, RN, vss, BBS= Clear.  

## 2015-07-03 NOTE — Op Note (Signed)
[facility]  [address] [place], [address]   COLONOSCOPY PROCEDURE REPORT  PATIENT: [name] [name], [name] [name]  MR#: [id number] BIRTHDATE: 10-17-1941 , 74  yrs. old GENDER: female ENDOSCOPIST: Eustace Quail, MD REFERRED IW:OEHOZ Little, M.D. PROCEDURE DATE:  07/03/2015 PROCEDURE:   Colonoscopy, screening and Colonoscopy with snare polypectomy x 1 First Screening Colonoscopy - Avg.  risk and is 50 yrs.  old or older - No.  Prior Negative Screening - Now for repeat screening. Less than 10 yrs Prior Negative Screening - Now for repeat screening.  Above average risk  History of Adenoma - Now for follow-up colonoscopy & has been > or = to 3 yrs.  N/A  Polyps removed today? Yes ASA CLASS:   Class III INDICATIONS:Screening for colonic neoplasia and FH Colon or Rectal Adenocarcinoma (parent 70-80). Exam 07-2007 (-). MEDICATIONS: Monitored anesthesia care and Propofol 250 mg IV  DESCRIPTION OF PROCEDURE:   After the risks benefits and alternatives of the procedure were thoroughly explained, informed consent was obtained.  The digital rectal exam revealed no abnormalities of the rectum.   The LB [id number]  endoscope was introduced through the anus and advanced to the cecum, which was identified by both the appendix and ileocecal valve. No adverse events experienced.   The quality of the prep was good.  (MoviPrep was used)  The instrument was then slowly withdrawn as the colon was fully examined. Estimated blood loss is zero unless otherwise noted in this procedure report.   COLON FINDINGS: A single polyp measuring 4 mm in size was found in the ascending colon.  A polypectomy was performed with a cold snare.  The resection was complete, the polyp tissue was completely retrieved and sent to histology.   There was moderate diverticulosis noted in the sigmoid colon with rectosigmoid stenosis.   The examination was otherwise normal.  Retroflexion was not performed due to a  narrow rectal vault. The time to cecum = 9.3 Withdrawal time = 11.3   The scope was withdrawn and the procedure completed. COMPLICATIONS: There were no immediate complications.  ENDOSCOPIC IMPRESSION: 1.   Single polyp was found in the ascending colon; polypectomy was performed with a cold snare 2.   Moderate diverticulosis was noted in the sigmoid colon 3.   The examination was otherwise normal  RECOMMENDATIONS: 1. Return to the care of your primary provider.  GI follow up as needed  eSigned:  Eustace Quail, MD 07/03/2015 11:22 AM   cc: The Patient and [name], MD

## 2015-07-03 NOTE — Progress Notes (Signed)
Called to room to assist during endoscopic procedure.  Patient ID and intended procedure confirmed with present staff. Received instructions for my participation in the procedure from the performing physician.  

## 2015-07-03 NOTE — Patient Instructions (Signed)
YOU HAD AN ENDOSCOPIC PROCEDURE TODAY AT [facility]:   Refer to the procedure report that was given to you for any specific questions about what was found during the examination.  If the procedure report does not answer your questions, please call your gastroenterologist to clarify.  If you requested that your care partner not be given the details of your procedure findings, then the procedure report has been included in a sealed envelope for you to review at your convenience later.  YOU SHOULD EXPECT: Some feelings of bloating in the abdomen. Passage of more gas than usual.  Walking can help get rid of the air that was put into your GI tract during the procedure and reduce the bloating. If you had a lower endoscopy (such as a colonoscopy or flexible sigmoidoscopy) you may notice spotting of blood in your stool or on the toilet paper. If you underwent a bowel prep for your procedure, you may not have a normal bowel movement for a few days.  Please Note:  You might notice some irritation and congestion in your nose or some drainage.  This is from the oxygen used during your procedure.  There is no need for concern and it should clear up in a day or so.  SYMPTOMS TO REPORT IMMEDIATELY:   Following lower endoscopy (colonoscopy or flexible sigmoidoscopy):  Excessive amounts of blood in the stool  Significant tenderness or worsening of abdominal pains  Swelling of the abdomen that is new, acute  Fever of 100F or higher   For urgent or emergent issues, a gastroenterologist can be reached at any hour by calling [phone].   DIET: Your first meal following the procedure should be a small meal and then it is ok to progress to your normal diet. Heavy or fried foods are harder to digest and may make you feel nauseous or bloated.  Likewise, meals heavy in dairy and vegetables can increase bloating.  Drink plenty of fluids but you should avoid alcoholic beverages for 24 hours. Increase  the fiber in your diet.  ACTIVITY:  You should plan to take it easy for the rest of today and you should NOT DRIVE or use heavy machinery until tomorrow (because of the sedation medicines used during the test).    FOLLOW UP: Our staff will call the number listed on your records the next business day following your procedure to check on you and address any questions or concerns that you may have regarding the information given to you following your procedure. If we do not reach you, we will leave a message.  However, if you are feeling well and you are not experiencing any problems, there is no need to return our call.  We will assume that you have returned to your regular daily activities without incident.  If any biopsies were taken you will be contacted by phone or by letter within the next 1-3 weeks.  Please call us at [phone] if you have not heard about the biopsies in 3 weeks.    SIGNATURES/CONFIDENTIALITY: You and/or your care partner have signed paperwork which will be entered into your electronic medical record.  These signatures attest to the fact that that the information above on your After Visit Summary has been reviewed and is understood.  Full responsibility of the confidentiality of this discharge information lies with you and/or your care-partner.  Read all of the handouts given to you by your recovery room nurse.  Thank-you for choosing [place]  for your medical needs.

## 2015-07-03 NOTE — Progress Notes (Signed)
Patient's hearing is quite poor.  Husband had to tell her everything and it was doubtful if she understood a lot of what we were telling her.  Husband gave good feedback regarding instructions.

## 2015-07-04 ENCOUNTER — Telehealth: Payer: Self-pay | Admitting: *Deleted

## 2015-07-04 NOTE — Telephone Encounter (Signed)
  Follow up Call-  Call back number 07/03/2015  Post procedure Call Back phone  # [phone]  Permission to leave phone message No  comments no answering machine     Patient questions:  Do you have a fever, pain , or abdominal swelling? No. Pain Score  0 *  Have you tolerated food without any problems? Yes.    Have you been able to return to your normal activities? Yes.    Do you have any questions about your discharge instructions: Diet   No. Medications  No. Follow up visit  No.  Do you have questions or concerns about your Care? No.  Actions: * If pain score is 4 or above: No action needed, pain <4.  Information provided per husband.

## 2015-07-11 ENCOUNTER — Encounter: Payer: Self-pay | Admitting: Internal Medicine

## 2015-07-12 ENCOUNTER — Other Ambulatory Visit: Payer: Self-pay

## 2015-07-12 MED ORDER — LEVETIRACETAM 250 MG PO TABS: 250.0000 mg | ORAL_TABLET | Freq: Two times a day (BID) | ORAL | Status: DC

## 2015-07-12 NOTE — Telephone Encounter (Signed)
Pharmacy requests 90 day Rx  

## 2015-08-16 ENCOUNTER — Ambulatory Visit (INDEPENDENT_AMBULATORY_CARE_PROVIDER_SITE_OTHER): Payer: Federal, State, Local not specified - PPO | Admitting: Neurology

## 2015-08-16 ENCOUNTER — Encounter: Payer: Self-pay | Admitting: Neurology

## 2015-08-16 VITALS — BP 112/63 | HR 61 | Ht 65.0 in | Wt 135.8 lb

## 2015-08-16 DIAGNOSIS — I6990 Unspecified sequelae of unspecified cerebrovascular disease: Secondary | ICD-10-CM | POA: Diagnosis not present

## 2015-08-16 NOTE — Progress Notes (Signed)
[facility]  PATIENT: [name] [name] DOB: 05-25-1941   REASON FOR VISIT: Followup seizure disorder and history of small right parietal occipital hemorrhage    HISTORY OF PRESENT ILLNESS: [name] [name], 73 year old white female returns for followup. She had a recent admission 06/25/2013 for persistent fevers. In addition she had 4 seizures prior to admission and a severe headache. LP was done and negative for meningitis. Keppra dosage was increased to 500 mg twice daily she has not had further seizure activity since her discharge. She is currently getting home health physical therapy. 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    HISTORY: She has a history of a small right parietal occipital hemorrhage without ventricular extension, hydrocephalus or mass effect. She had a seizure, an EEG showed lateralized epileptiform discharges and she was started on Keppra. She also has a partial left-sided visual field cut on exam, she has done well since discharge and is back at her home . Her MRI 11/25/2012 shows expected evolutionary changes in the right posterior medial temporal lobe when compared to prior MRI scan. There are mild changes of chronic microvascular ischemia and generalized cerebral atrophy which are unchanged.   Update 05/24/2015 : She returns for follow-up after last visit here and a half ago. She is not having recurrent seizures or stroke or TIA symptoms. Patient's husband however feels that she's had some paranoid behavior of symptoms the Keppra dose was increased. She suspects him of infidelity as well as at times she has some moderately hallucinations. She can be redirected. She's been having some pain in her hands off and on her neck which may be related to arthritis. She remains on Keppra 500 twice daily. Update 08/16/2015 : She returns for follow-up after last visit 3 months ago. She is  accompanied by husband. He states that she is had decreased paranoia and improved behavior after reducing the dose of Keppra to 250 mg twice daily. She's not had any breakthrough seizures. Her blood pressure is well controlled and today it is 112/63. She is mostly independent home occasionally she needs help to get up. The patient had a minor head injury when she hit her head on the partition in [employer] in her brief blackout which is well since then. She had a recent colonoscopy and had polyps removed. She plans to see her orthopedic surgeon for left rotator cuff problems she had an injection from primary physician which has not helped. She continues to have mild memory difficulties but these are stable. REVIEW OF SYSTEMS: Full 14 system review of systems performed and notable only for:   Fatigue, heat intolerance, swollen abdomen, abdominal pain, insomnia, frequent waking, frequency of urination, headache, back pain, itching and all other systems negative ALLERGIES: Allergies  Allergen Reactions  . Barbiturates     Becomes restless.  Also with all "strong" medications like sleeping pills  . Latex Itching    HOME MEDICATIONS: Outpatient Prescriptions Prior to Visit  Medication Sig Dispense Refill  . aspirin 81 MG tablet Take 81 mg by mouth daily.    Marland Kitchen atorvastatin (LIPITOR) 10 MG tablet Take 10 mg by mouth daily.     . B-D ULTRAFINE III SHORT PEN 31G X 8 MM MISC     . BD PEN NEEDLE NANO U/F 32G X 4 MM MISC USE FOUR TIMES A DAY SUBCUTANEOUS  1  . diltiazem (CARDIZEM) 90 MG tablet Take 90 mg by mouth 3 (three)  times daily.  5  . hydrOXYzine (ATARAX/VISTARIL) 25 MG tablet TAKE 1 TABLET BY MOUTH AT BEDTIME AS NEEDED FOR ITCHING  1  . LEVEMIR FLEXTOUCH 100 UNIT/ML Pen INJECT 20 UNITS SUBCUTANEOUSLY DAILY  2  . levETIRAcetam (KEPPRA) 250 MG tablet Take 1 tablet (250 mg total) by mouth 2 (two) times daily. 180 tablet 0  . Magnesium 500 MG TABS Take 1 tablet (500 mg total) by mouth 3 (three) times  daily. 90 tablet 0  . metoprolol tartrate (LOPRESSOR) 25 MG tablet Take 25 mg by mouth 2 (two) times daily.    Marland Kitchen NOVOLOG FLEXPEN 100 UNIT/ML SOPN FlexPen     . omeprazole (PRILOSEC) 20 MG capsule Take 20 mg by mouth daily.  5  . vitamin C (ASCORBIC ACID) 500 MG tablet Take 500 mg by mouth daily.     No facility-administered medications prior to visit.    PAST MEDICAL HISTORY: Past Medical History  Diagnosis Date  . Diabetes mellitus   . GERD (gastroesophageal reflux disease)   . Cataract   . Stroke [facility]) 2013    Secondary to acute right posterior temporo-occipital intra-axial hemorrhage  . Arthritis   . Hyperlipidemia   . Hypertension   . Seizures ([place])     last seizure 2014    PAST SURGICAL HISTORY: Past Surgical History  Procedure Laterality Date  . 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 Cataract extraction      both eyes  . Colonoscopy      FAMILY HISTORY: Family History  Problem Relation Age of Onset  . Cancer Mother   . Colon cancer Mother   . Liver disease Mother   . Breast cancer Mother   . Cancer Father   . Diabetes Paternal Grandmother   . Esophageal cancer Neg Hx   . Rectal cancer Neg Hx   . Stomach cancer Neg Hx     SOCIAL HISTORY: Social History   Social History  . Marital Status: Married    Spouse Name: N/A  . Number of Children: N/A  . Years of Education: N/A   Occupational History  . Retired    Social History Main Topics  . Smoking status: Never Smoker   . Smokeless tobacco: Never Used  . Alcohol Use: No     Comment: patient drinks caffeinated drinks.   . Drug Use: No  . Sexual Activity: Not on file   Other Topics Concern  . Not on file   Social History Narrative   Patient lives at home with her husband and daughter and has a high school education.      PHYSICAL EXAM  Filed Vitals:    08/16/15 1437  BP: 112/63  Pulse: 61  Height: 5\' 5"  (1.651 m)  Weight: 135 lb 12.8 oz (61.598 kg)   Body mass index is 22.6 kg/(m^2).  Generalized: Well developed, in no acute distress  Head: normocephalic and atraumatic,. Oropharynx benign  Neck: Supple, no carotid bruits  Cardiac: Regular rate rhythm, no murmurs    Neurological examination   Mentation: Alert oriented to time, place, history taking. Follows all commands speech and language fluent  Cranial nerve II-XII: Pupils were equal round reactive to light extraocular movements were full, visual field with partial left homonymous hemianopsia to  confrontational test. Facial sensation and strength were normal. Hard of hearing. Uvula tongue midline.  head turning and shoulder shrug and were normal and symmetric.Tongue protrusion into cheek strength was normal. Motor: normal bulk and tone, full strength in the BUE, BLE, fine finger movements normal, no pronator drift. No focal weakness Sensory: normal and symmetric to light touch, pinprick, and  vibration  Coordination: finger-nose-finger,  no dysmetria Reflexes: Brachioradialis 2/2, biceps 2/2, triceps 2/2, patellar 2/2, Achilles 2/2, plantar responses were flexor bilaterally. Gait and Station: Rising up from seated position without assistance, normal stance, without trunk ataxia, moderate stride, good arm swing, smooth turning, able to perform tiptoe, and heel walking without difficulty. Mildly unsteady with tandem, Romberg negative  DIAGNOSTIC DATA (LABS, IMAGING, TESTING) - I reviewed patient records, labs, notes, testing and imaging myself where available.  Lab Results  Component Value Date   WBC 9.7 07/08/2013   HGB 8.6* 07/08/2013   HCT 26.3* 07/08/2013   MCV 97.8 07/08/2013   PLT 369 07/08/2013      Component Value Date/Time   NA 143 07/08/2013 0450   K 3.6 07/08/2013 0450   CL 103 07/08/2013 0450   CO2 29 07/08/2013 0450   GLUCOSE 138* 07/08/2013 0450   BUN 15  07/08/2013 0450   CREATININE 0.93 07/08/2013 0450   CREATININE 1.34* 08/16/2012 1614   CALCIUM 7.5* 07/08/2013 0450   PROT 5.8* 07/08/2013 0450   ALBUMIN 2.2* 07/08/2013 0450   AST 24 07/08/2013 0450   ALT 15 07/08/2013 0450   ALKPHOS 105 07/08/2013 0450   BILITOT 0.4 07/08/2013 0450   GFRNONAA 60* 07/08/2013 0450   GFRAA 69* 07/08/2013 0450   Lab Results  Component Value Date   CHOL 129 06/29/2013   HDL 86 06/29/2013   LDLCALC 27 06/29/2013   TRIG 82 06/29/2013   CHOLHDL 1.5 06/29/2013   Lab Results  Component Value Date   HGBA1C 8.6* 06/29/2013    Lab Results  Component Value Date   TSH 0.365 07/02/2013      ASSESSMENT AND PLAN  74 y.o. year old female  has a past medical history of Diabetes mellitus; GERD (gastroesophageal reflux disease); Cataract;Rt pareitooccipital hemorrhagic stroke Stroke [facility]) (2013); Arthritis; Hyperlipidemia; Hypertension; and Seizures ([place]). followup after recent hospital admission in August for severe headache and seizures. LP negative for meningitis, Keppra dose increase to 500 mg twice daily without further seizure events but had paranoia which has improved after reducing the Keppra dose back to 250 twice daily.   Plan :   I had a long discussion with the patient and her husband regarding her post stroke seizures which seem to be quite well controlled on the current dose of Keppra  250 mg twice daily and her paranoid behavior also seems to have improved after reducing the dose. 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 If she has worsening of behavior or cognitive changes have advised him to call me otherwise she'll return for follow-up in 6 months or call earlier if necessary. Antony Contras, MD [facility] [address] [place], [place] [address] [phone]

## 2015-08-16 NOTE — Patient Instructions (Signed)
I had a long discussion with the patient and her husband regarding her post stroke seizures which seem to be quite well controlled on the current dose of Keppra  250 mg twice daily and her paranoid behavior also seems to have improved after reducing the dose. 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 If she has worsening of behavior or cognitive changes have advised him to call me otherwise she'll return for follow-up in 6 months or call earlier if necessary.

## 2015-11-05 ENCOUNTER — Other Ambulatory Visit: Payer: Self-pay | Admitting: Neurology

## 2015-12-27 ENCOUNTER — Other Ambulatory Visit: Payer: Self-pay | Admitting: Nephrology

## 2015-12-27 DIAGNOSIS — N183 Chronic kidney disease, stage 3 unspecified: Secondary | ICD-10-CM

## 2016-01-02 ENCOUNTER — Ambulatory Visit
Admission: RE | Admit: 2016-01-02 | Discharge: 2016-01-02 | Disposition: A | Discharge status: Home / Self Care | Payer: Federal, State, Local not specified - PPO | Source: Ambulatory Visit | Attending: Nephrology | Admitting: Nephrology

## 2016-01-02 DIAGNOSIS — N183 Chronic kidney disease, stage 3 unspecified: Secondary | ICD-10-CM

## 2016-02-04 ENCOUNTER — Other Ambulatory Visit: Payer: Self-pay | Admitting: Neurology

## 2016-02-05 ENCOUNTER — Other Ambulatory Visit: Payer: Self-pay | Admitting: Neurology

## 2016-02-14 ENCOUNTER — Ambulatory Visit (INDEPENDENT_AMBULATORY_CARE_PROVIDER_SITE_OTHER): Payer: Federal, State, Local not specified - PPO | Admitting: Neurology

## 2016-02-14 ENCOUNTER — Encounter: Payer: Self-pay | Admitting: Neurology

## 2016-02-14 VITALS — BP 107/56 | HR 64 | Ht 65.0 in | Wt 143.4 lb

## 2016-02-14 DIAGNOSIS — I6990 Unspecified sequelae of unspecified cerebrovascular disease: Secondary | ICD-10-CM

## 2016-02-14 NOTE — Patient Instructions (Signed)
I had a long discussion with the patient and husband regards to her remote stroke and seizures both of which appeared to be stable. Continue aspirin for stroke prevention and strict control of lipids with LDL cholesterol goal below 70 mg percent and hypertension with blood pressure goal below 130/90. Patient continues to have some paranoid behavior despite reducing the dose of Keppra  to 250 twice daily  But no breakthrough seizures. Patient has been advised to call me in case she had breakthrough seizures in that case we may need to change to an alternative seizure medication. Patient and husband do not want to come off the Keppra completely and try alternative seizure medications at the present time. She was asked to return for follow-up in 6 months or call earlier if necessary

## 2016-02-14 NOTE — Progress Notes (Signed)
[facility]  PATIENT: [name] [name] DOB: 09-01-1941   REASON FOR VISIT: Followup seizure disorder and history of small right parietal occipital hemorrhage    HISTORY OF PRESENT ILLNESS: [name] [name], 75 year old white female returns for followup. She had a recent admission 06/25/2013 for persistent fevers. In addition she had 4 seizures prior to admission and a severe headache. LP was done and negative for meningitis. Keppra dosage was increased to 500 mg twice daily she has not had further seizure activity since her discharge. She is currently getting home health physical therapy. 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    HISTORY: She has a history of a small right parietal occipital hemorrhage without ventricular extension, hydrocephalus or mass effect. She had a seizure, an EEG showed lateralized epileptiform discharges and she was started on Keppra. She also has a partial left-sided visual field cut on exam, she has done well since discharge and is back at her home . Her MRI 11/25/2012 shows expected evolutionary changes in the right posterior medial temporal lobe when compared to prior MRI scan. There are mild changes of chronic microvascular ischemia and generalized cerebral atrophy which are unchanged.   Update 05/24/2015 : She returns for follow-up after last visit here and a half ago. She is not having recurrent seizures or stroke or TIA symptoms. Patient's husband however feels that she's had some paranoid behavior of symptoms the Keppra dose was increased. She suspects him of infidelity as well as at times she has some moderately hallucinations. She can be redirected. She's been having some pain in her hands off and on her neck which may be related to arthritis. She remains on Keppra 500 twice daily. Update 08/16/2015 : She returns for follow-up after last visit 3 months ago. She is  accompanied by husband. He states that she is had decreased paranoia and improved behavior after reducing the dose of Keppra to 250 mg twice daily. She's not had any breakthrough seizures. Her blood pressure is well controlled and today it is 112/63. She is mostly independent home occasionally she needs help to get up. The patient had a minor head injury when she hit her head on the partition in [employer] in her brief blackout which is well since then. She had a recent colonoscopy and had polyps removed. She plans to see her orthopedic surgeon for left rotator cuff problems she had an injection from primary physician which has not helped. She continues to have mild memory difficulties but these are stable. Update 02/14/2016 : She returns for follow-up after last visit 6 months ago. She continues to do well without recurrent breakthrough seizures. She has reduced the dose of Keppra to 250 mg twice daily but she still has some paranoid ideation and does hear voices. She says that she is quite anxious because her husband recently had a melanoma removed from his back. She states her blood pressure is well controlled today it is 107/56. Her fasting sugars range in the 1 20-1 30 range however last hemoglobin A1c was 8.1. She is tolerating Lipitor well without any side effects and had lipid profile checked in Jan which was satisfactory. The patient is reluctant to completely come off [place] and try alternate seizure medications as she is scared of having another seizure. REVIEW OF SYSTEMS: Full 14 system review of systems performed and notable only for:   Fatigue,  hearing loss, loss of vision, blurred vision, chest pain,  leg swelling, frequency of urination, insomnia, daytime sleepiness, joint and back pain, walking difficulty, skin itching, anemia, dizziness, headache and all other systems negative ALLERGIES: Allergies  Allergen Reactions  . Barbiturates     Becomes restless.  Also with all "strong" medications like  sleeping pills  . Latex Itching    HOME MEDICATIONS: Outpatient Prescriptions Prior to Visit  Medication Sig Dispense Refill  . aspirin 81 MG tablet Take 81 mg by mouth daily.    Marland Kitchen atorvastatin (LIPITOR) 10 MG tablet Take 10 mg by mouth daily.     . B-D ULTRAFINE III SHORT PEN 31G X 8 MM MISC     . BD PEN NEEDLE NANO U/F 32G X 4 MM MISC USE FOUR TIMES A DAY SUBCUTANEOUS  1  . diltiazem (CARDIZEM) 90 MG tablet Take 90 mg by mouth 3 (three) times daily.  5  . hydrOXYzine (ATARAX/VISTARIL) 25 MG tablet TAKE 1 TABLET BY MOUTH AT BEDTIME AS NEEDED FOR ITCHING  1  . LEVEMIR FLEXTOUCH 100 UNIT/ML Pen INJECT 9 UNITS SUBCUTANEOUSLY DAILY  2  . levETIRAcetam (KEPPRA) 250 MG tablet TAKE 1 TABLET BY MOUTH TWICE A DAY 180 tablet 0  . Magnesium 500 MG TABS Take 1 tablet (500 mg total) by mouth 3 (three) times daily. 90 tablet 0  . metoprolol tartrate (LOPRESSOR) 25 MG tablet Take 25 mg by mouth 2 (two) times daily.    Marland Kitchen NOVOLOG FLEXPEN 100 UNIT/ML SOPN FlexPen     . omeprazole (PRILOSEC) 20 MG capsule Take 20 mg by mouth daily.  5  . vitamin C (ASCORBIC ACID) 500 MG tablet Take 500 mg by mouth daily.     No facility-administered medications prior to visit.    PAST MEDICAL HISTORY: Past Medical History  Diagnosis Date  . Diabetes mellitus   . GERD (gastroesophageal reflux disease)   . Cataract   . Stroke [facility]) 2013    Secondary to acute right posterior temporo-occipital intra-axial hemorrhage  . Arthritis   . Hyperlipidemia   . Hypertension   . Seizures ([place])     last seizure 2014    PAST SURGICAL HISTORY: Past Surgical History  Procedure Laterality Date  . 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 Cataract extraction      both eyes  . Colonoscopy      FAMILY HISTORY: Family History  Problem Relation Age of Onset  . Cancer Mother   . Colon  cancer Mother   . Liver disease Mother   . Breast cancer Mother   . Cancer Father   . Diabetes Paternal Grandmother   . Esophageal cancer Neg Hx   . Rectal cancer Neg Hx   . Stomach cancer Neg Hx     SOCIAL HISTORY: Social History   Social History  . Marital Status: Married    Spouse Name: N/A  . Number of Children: N/A  . Years of Education: N/A   Occupational History  . Retired    Social History Main Topics  . Smoking status: Never Smoker   . Smokeless tobacco: Never Used  . Alcohol Use: No     Comment: patient drinks caffeinated drinks.   . Drug Use: No  . Sexual Activity: Not on file   Other Topics Concern  . Not on file   Social History Narrative   Patient lives at home  with her husband and daughter and has a high school education.      PHYSICAL EXAM  Filed Vitals:   02/14/16 1435  BP: 107/56  Pulse: 64  Height: 5\' 5"  (1.651 m)  Weight: 143 lb 6.4 oz (65.046 kg)   Body mass index is 23.86 kg/(m^2).  Generalized: Well developed, in no acute distress  Head: normocephalic and atraumatic,. Oropharynx benign  Neck: Supple, no carotid bruits  Cardiac: Regular rate rhythm, no murmurs    Neurological examination   Mentation: Alert oriented to time, place, history taking. Follows all commands speech and language fluent  Cranial nerve II-XII: Pupils were equal round reactive to light extraocular movements were full, visual field with partial left homonymous hemianopsia to  confrontational test. Facial sensation and strength were normal. Hard of hearing. Uvula tongue midline. head turning and shoulder shrug and were normal and symmetric.Tongue protrusion into cheek strength was normal. Motor: normal bulk and tone, full strength in the BUE, BLE, fine finger movements normal, no pronator drift. No focal weakness Sensory: normal and symmetric to light touch, pinprick, and  vibration  Coordination: finger-nose-finger,  no dysmetria Reflexes: Brachioradialis 2/2,  biceps 2/2, triceps 2/2, patellar 2/2, Achilles 2/2, plantar responses were flexor bilaterally. Gait and Station: Rising up from seated position without assistance, normal stance, without trunk ataxia, moderate stride, good arm swing, smooth turning, able to perform tiptoe, and heel walking without difficulty. Mildly unsteady with tandem, Romberg negative  DIAGNOSTIC DATA (LABS, IMAGING, TESTING) - I reviewed patient records, labs, notes, testing and imaging myself where available.  Lab Results  Component Value Date   WBC 9.7 07/08/2013   HGB 8.6* 07/08/2013   HCT 26.3* 07/08/2013   MCV 97.8 07/08/2013   PLT 369 07/08/2013      Component Value Date/Time   NA 143 07/08/2013 0450   K 3.6 07/08/2013 0450   CL 103 07/08/2013 0450   CO2 29 07/08/2013 0450   GLUCOSE 138* 07/08/2013 0450   BUN 15 07/08/2013 0450   CREATININE 0.93 07/08/2013 0450   CREATININE 1.34* 08/16/2012 1614   CALCIUM 7.5* 07/08/2013 0450   PROT 5.8* 07/08/2013 0450   ALBUMIN 2.2* 07/08/2013 0450   AST 24 07/08/2013 0450   ALT 15 07/08/2013 0450   ALKPHOS 105 07/08/2013 0450   BILITOT 0.4 07/08/2013 0450   GFRNONAA 60* 07/08/2013 0450   GFRAA 69* 07/08/2013 0450   Lab Results  Component Value Date   CHOL 129 06/29/2013   HDL 86 06/29/2013   LDLCALC 27 06/29/2013   TRIG 82 06/29/2013   CHOLHDL 1.5 06/29/2013   Lab Results  Component Value Date   HGBA1C 8.6* 06/29/2013    Lab Results  Component Value Date   TSH 0.365 07/02/2013      ASSESSMENT AND PLAN  75 y.o. year old female  has a past medical history of Diabetes mellitus; GERD (gastroesophageal reflux disease); Cataract;Rt pareitooccipital hemorrhagic stroke Stroke [facility]) (2013); Arthritis; Hyperlipidemia; Hypertension; and Seizures ([place]). followup after recent hospital admission in August for severe headache and seizures. LP negative for meningitis, Keppra dose increase to 500 mg twice daily without further seizure events but had paranoia which  has improved after reducing the Keppra dose back to 250 twice daily.   Plan :   I had a long discussion with the patient and husband regards to her remote stroke and seizures both of which appeared to be stable. Continue aspirin for stroke prevention and strict control of lipids with LDL cholesterol goal below  70 mg percent and hypertension with blood pressure goal below 130/90. Patient continues to have some paranoid behavior despite reducing the dose of Keppra  to 250 twice daily  But no breakthrough seizures. Patient has been advised to call me in case she had breakthrough seizures in that case we may need to change to an alternative seizure medication. Patient and husband do not want to come off the Keppra completely and try alternative seizure medications at the present time. She was asked to return for follow-up in 6 months or call earlier if necessary Antony Contras, MD [facility] [address] [place], [place] [address] [phone]

## 2016-05-02 ENCOUNTER — Other Ambulatory Visit: Payer: Self-pay | Admitting: Neurology

## 2016-05-02 ENCOUNTER — Other Ambulatory Visit: Payer: Self-pay

## 2016-05-22 ENCOUNTER — Other Ambulatory Visit: Payer: Self-pay | Admitting: Family Medicine

## 2016-05-22 DIAGNOSIS — Z1231 Encounter for screening mammogram for malignant neoplasm of breast: Secondary | ICD-10-CM

## 2016-05-29 ENCOUNTER — Ambulatory Visit
Admission: RE | Admit: 2016-05-29 | Discharge: 2016-05-29 | Disposition: A | Discharge status: Home / Self Care | Payer: Federal, State, Local not specified - PPO | Source: Ambulatory Visit | Attending: Family Medicine | Admitting: Family Medicine

## 2016-05-29 DIAGNOSIS — Z1231 Encounter for screening mammogram for malignant neoplasm of breast: Secondary | ICD-10-CM

## 2016-06-04 ENCOUNTER — Ambulatory Visit (INDEPENDENT_AMBULATORY_CARE_PROVIDER_SITE_OTHER): Payer: Federal, State, Local not specified - PPO | Admitting: Podiatry

## 2016-06-04 ENCOUNTER — Encounter: Payer: Self-pay | Admitting: Podiatry

## 2016-06-04 ENCOUNTER — Ambulatory Visit: Payer: Federal, State, Local not specified - PPO | Admitting: Podiatry

## 2016-06-04 VITALS — BP 140/73 | HR 68 | Resp 14 | Ht 64.5 in | Wt 142.0 lb

## 2016-06-04 DIAGNOSIS — B351 Tinea unguium: Secondary | ICD-10-CM

## 2016-06-04 DIAGNOSIS — E119 Type 2 diabetes mellitus without complications: Secondary | ICD-10-CM

## 2016-06-04 DIAGNOSIS — M79676 Pain in unspecified toe(s): Secondary | ICD-10-CM | POA: Diagnosis not present

## 2016-06-04 NOTE — Progress Notes (Signed)
   Subjective:    Patient ID: [name] [name], female    DOB: 05/22/1941, 75 y.o.   MRN: [id number]  HPI this patient presents to the office with chief complaint of her great nails growing under the tips of her toes on both feet. She is concerned about this growth since there is pain related to the nails when she walks. 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 She presents the office today for an evaluation and treatment of her nails. She also requests evaluation of her diabetic feet    Review of Systems  All other systems reviewed and are negative.      Objective:   Physical Exam GENERAL APPEARANCE: Alert, conversant. Appropriately groomed. No acute distress.  VASCULAR: Pedal pulses are  palpable at  [facility] and PT bilateral.  Capillary refill time is immediate to all digits,  Normal temperature gradient.  Digital hair growth is present bilateral  NEUROLOGIC: sensation is normal to 5.07 monofilament at 5/5 sites bilateral.  Light touch is intact bilateral, Muscle strength normal.  MUSCULOSKELETAL: acceptable muscle strength, tone and stability bilateral.  Intrinsic muscluature intact bilateral.  HAV  B/L and hammer toes B/L.  DERMATOLOGIC: skin color, texture, and turgor are within normal limits.  No preulcerative lesions or ulcers  are seen, no interdigital maceration noted.  No open lesions present.   No drainage noted.  NAILS  Thick disfigured discolored great toenails both feet.  No signs of redness or swelling noted.         Assessment & Plan:  Onychomycosis  Diabetes with no complications.   IE  Debridement of nails.  RTC 4 months   Gardiner Barefoot DPM

## 2016-08-27 ENCOUNTER — Ambulatory Visit (INDEPENDENT_AMBULATORY_CARE_PROVIDER_SITE_OTHER): Payer: Federal, State, Local not specified - PPO | Admitting: Neurology

## 2016-08-27 ENCOUNTER — Encounter: Payer: Self-pay | Admitting: Neurology

## 2016-08-27 VITALS — BP 94/60 | HR 66 | Ht 64.5 in | Wt 142.4 lb

## 2016-08-27 DIAGNOSIS — G40209 Localization-related (focal) (partial) symptomatic epilepsy and epileptic syndromes with complex partial seizures, not intractable, without status epilepticus: Secondary | ICD-10-CM | POA: Diagnosis not present

## 2016-08-27 MED ORDER — LEVETIRACETAM 250 MG PO TABS: 250.0000 mg | ORAL_TABLET | Freq: Every morning | ORAL | 0 refills | Status: DC

## 2016-08-27 MED ORDER — DIVALPROEX SODIUM ER 250 MG PO TB24: 500.0000 mg | ORAL_TABLET | Freq: Every day | ORAL | Status: DC

## 2016-08-27 NOTE — Patient Instructions (Addendum)
I had a long discussion with the patient and husband regarding her remote complex partial seizures and her persistent paranoid delusions despite the low dose of Keppra. I recommend tapering the Keppra to 250 mg daily for one week and discontinue start on 09/01/16. I will replace Keppra with Depakote ER 500 mg daily. She will stay on aspirin for stroke prevention and maintain strict control of diabetes with hemoglobin A1c goal below 6.5, lipids with LDL cholesterol goal below 70 mg percent and hypertension with blood pressure goal below 130/90. Return for follow-up in a year or call earlier if necessary.

## 2016-08-27 NOTE — Progress Notes (Signed)
[facility]  PATIENT: [name] [name] DOB: June 27, 1941   REASON FOR VISIT: Followup seizure disorder and history of small right parietal occipital hemorrhage    HISTORY OF PRESENT ILLNESS: Ms. Kelly, 75 year old white female returns for followup. She had a recent admission 06/25/2013 for persistent fevers. In addition she had 4 seizures prior to admission and a severe headache. LP was done and negative for meningitis. Keppra dosage was increased to 500 mg twice daily she has not had further seizure activity since her discharge. She is currently getting home health physical therapy. 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    HISTORY: She has a history of a small right parietal occipital hemorrhage without ventricular extension, hydrocephalus or mass effect. She had a seizure, an EEG showed lateralized epileptiform discharges and she was started on Keppra. She also has a partial left-sided visual field cut on exam, she has done well since discharge and is back at her home . Her MRI 11/25/2012 shows expected evolutionary changes in the right posterior medial temporal lobe when compared to prior MRI scan. There are mild changes of chronic microvascular ischemia and generalized cerebral atrophy which are unchanged.   Update 05/24/2015 : She returns for follow-up after last visit here and a half ago. She is not having recurrent seizures or stroke or TIA symptoms. Patient's husband however feels that she's had some paranoid behavior of symptoms the Keppra dose was increased. She suspects him of infidelity as well as at times she has some moderately hallucinations. She can be redirected. She's been having some pain in her hands off and on her neck which may be related to arthritis. She remains on Keppra 500 twice daily. Update 08/16/2015 : She returns for follow-up after last visit 3 months ago. She is  accompanied by husband. He states that she is had decreased paranoia and improved behavior after reducing the dose of Keppra to 250 mg twice daily. She's not had any breakthrough seizures. Her blood pressure is well controlled and today it is 112/63. She is mostly independent home occasionally she needs help to get up. The patient had a minor head injury when she hit her head on the partition in [employer] in her brief blackout which is well since then. She had a recent colonoscopy and had polyps removed. She plans to see her orthopedic surgeon for left rotator cuff problems she had an injection from primary physician which has not helped. She continues to have mild memory difficulties but these are stable. Update 02/14/2016 : She returns for follow-up after last visit 6 months ago. She continues to do well without recurrent breakthrough seizures. She has reduced the dose of Keppra to 250 mg twice daily but she still has some paranoid ideation and does hear voices. She says that she is quite anxious because her husband recently had a melanoma removed from his back. She states her blood pressure is well controlled today it is 107/56. Her fasting sugars range in the 1 20-1 30 range however last hemoglobin A1c was 8.1. She is tolerating Lipitor well without any side effects and had lipid profile checked in Jan which was satisfactory. The patient is reluctant to completely come off [place] and try alternate seizure medications as she is scared of having another seizure. Update 08/27/2016 : She returns for follow-up after last visit 6 months ago. She is accompanied by husband who provides most of the history. Patient has not  had any recurrent seizures or strokes. She however continues to have some paranoid ideations and fixed delusions particularly at night. She has music play and she feels this makes her distress and confused. She often accuses her husband however having other people live in the house. Patient complains of  mild bitemporal headaches which are not bothersome and occur occasionally. She takes some Tylenol for this. She recently saw a nephrologist for hyperkalemia and anemia. She has been started on furosemide which has helped her potassium. She states her blood pressure has been running good though today it is slightly low at 94/66. Her blood sugars are better occasionally they run low. She has not had any recurrent seizures or TIA stroke symptoms. REVIEW OF SYSTEMS: Full 14 system review of systems performed and notable only for:   Fatigue, blurred vision, chest pain, leg swelling, frequency of urination, daytime sleepiness, back pain, neck pain and stiffness, skin rash, itching, anemia, dizziness, headache, nervousness and anxiety and all other systems negative ALLERGIES: Allergies  Allergen Reactions  . Barbiturates     Becomes restless.  Also with all "strong" medications like sleeping pills  . Latex Itching    HOME MEDICATIONS: Outpatient Medications Prior to Visit  Medication Sig Dispense Refill  . acetaminophen (TYLENOL) 325 MG tablet Take 650 mg by mouth every 6 (six) hours as needed.    Marland Kitchen aspirin 81 MG tablet Take 81 mg by mouth daily.    Marland Kitchen atorvastatin (LIPITOR) 10 MG tablet Take 10 mg by mouth daily.     . B-D ULTRAFINE III SHORT PEN 31G X 8 MM MISC     . BD PEN NEEDLE NANO U/F 32G X 4 MM MISC USE FOUR TIMES A DAY SUBCUTANEOUS  1  . diltiazem (CARDIZEM) 90 MG tablet Take 90 mg by mouth 3 (three) times daily.  5  . hydrOXYzine (ATARAX/VISTARIL) 25 MG tablet TAKE 1 TABLET BY MOUTH AT BEDTIME AS NEEDED FOR ITCHING  1  . LEVEMIR FLEXTOUCH 100 UNIT/ML Pen INJECT 9 UNITS SUBCUTANEOUSLY DAILY  2  . Magnesium 500 MG TABS Take 1 tablet (500 mg total) by mouth 3 (three) times daily. 90 tablet 0  . metoprolol tartrate (LOPRESSOR) 25 MG tablet Take 25 mg by mouth 2 (two) times daily.    . Multiple Vitamin (MULTIVITAMIN) capsule Take 1 capsule by mouth daily.    Marland Kitchen NOVOLOG FLEXPEN 100 UNIT/ML SOPN  FlexPen     . omeprazole (PRILOSEC) 20 MG capsule Take 20 mg by mouth daily.  5  . vitamin C (ASCORBIC ACID) 500 MG tablet Take 500 mg by mouth daily.    Marland Kitchen levETIRAcetam (KEPPRA) 250 MG tablet TAKE 1 TABLET BY MOUTH TWICE A DAY 180 tablet 2   No facility-administered medications prior to visit.     PAST MEDICAL HISTORY: Past Medical History:  Diagnosis Date  . Arthritis   . Cataract   . Diabetes mellitus   . GERD (gastroesophageal reflux disease)   . Hyperlipidemia   . Hypertension   . Seizures ([place])    last seizure 2014  . Stroke [facility]) 2013   Secondary to acute right posterior temporo-occipital intra-axial hemorrhage    PAST SURGICAL HISTORY: Past Surgical History:  Procedure Laterality Date  . CATARACT EXTRACTION     both eyes  . COLONOSCOPY    . 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 Cancer Mother   . Colon cancer Mother   . Liver disease Mother   . Breast cancer Mother   . Cancer Father   . Diabetes Paternal Grandmother   . Esophageal cancer Neg Hx   . Rectal cancer Neg Hx   . Stomach cancer Neg Hx     SOCIAL HISTORY: Social History   Social History  . Marital status: Married    Spouse name: N/A  . Number of children: N/A  . Years of education: N/A   Occupational History  . Retired    Social History Main Topics  . Smoking status: Never Smoker  . Smokeless tobacco: Never Used  . Alcohol use No     Comment: patient drinks caffeinated drinks.   . Drug use: No  . Sexual activity: Not on file   Other Topics Concern  . Not on file   Social History Narrative   Patient lives at home with her husband and daughter and has a high school education.      PHYSICAL EXAM  Vitals:   08/27/16 1441  BP: 94/60  BP Location: Right Arm  Patient  Position: Sitting  Cuff Size: Normal  Pulse: 66  Weight: 142 lb 6.4 oz (64.6 kg)  Height: 5' 4.5" (1.638 m)   Body mass index is 24.07 kg/m.  Generalized: Well developed, in no acute distress  Head: normocephalic and atraumatic,. Oropharynx benign  Neck: Supple, no carotid bruits  Cardiac: Regular rate rhythm, no murmurs    Neurological examination   Mentation: Alert oriented to time, place, history taking. Follows all commands speech and language fluent  Cranial nerve II-XII: Pupils were equal round reactive to light extraocular movements were full, visual field with partial left homonymous hemianopsia to  confrontational test. Facial sensation and strength were normal. Hard of hearing bilaterally. Uvula tongue midline. head turning and shoulder shrug and were normal and symmetric.Tongue protrusion into cheek strength was normal. Motor: normal bulk and tone, full strength in the BUE, BLE, fine finger movements normal, no pronator drift. No focal weakness Sensory: normal and symmetric to light touch, pinprick, and  vibration  Coordination: finger-nose-finger,  no dysmetria Reflexes: Brachioradialis 2/2, biceps 2/2, triceps 2/2, patellar 2/2, Achilles 2/2, plantar responses were flexor bilaterally. Gait and Station: Rising up from seated position without assistance, normal stance, without trunk ataxia, moderate stride, good arm swing, smooth turning, able to perform tiptoe, and heel walking without difficulty. Mildly unsteady with tandem, Romberg negative  DIAGNOSTIC DATA (LABS, IMAGING, TESTING) - I reviewed patient records, labs, notes, testing and imaging myself where available.  Lab Results  Component Value Date   WBC 9.7 07/08/2013   HGB 8.6 (L) 07/08/2013   HCT 26.3 (L) 07/08/2013   MCV 97.8 07/08/2013   PLT 369 07/08/2013      Component Value Date/Time   NA 143 07/08/2013 0450   K 3.6 07/08/2013 0450   CL 103 07/08/2013 0450   CO2 29 07/08/2013 0450   GLUCOSE 138 (H)  07/08/2013 0450   BUN 15 07/08/2013 0450   CREATININE 0.93 07/08/2013 0450   CREATININE 1.34 (H) 08/16/2012 1614   CALCIUM 7.5 (L) 07/08/2013 0450   PROT 5.8 (L) 07/08/2013 0450   ALBUMIN 2.2 (L) 07/08/2013 0450   AST 24 07/08/2013 0450   ALT 15 07/08/2013 0450   ALKPHOS 105 07/08/2013 0450   BILITOT 0.4 07/08/2013 0450   GFRNONAA 60 (L) 07/08/2013 0450  GFRAA 69 (L) 07/08/2013 0450   Lab Results  Component Value Date   CHOL 129 06/29/2013   HDL 86 06/29/2013   LDLCALC 27 06/29/2013   TRIG 82 06/29/2013   CHOLHDL 1.5 06/29/2013   Lab Results  Component Value Date   HGBA1C 8.6 (H) 06/29/2013    Lab Results  Component Value Date   TSH 0.365 07/02/2013      ASSESSMENT AND PLAN  75 y.o. year old female  has a past medical history of Diabetes mellitus; GERD (gastroesophageal reflux disease); Cataract;Rt pareitooccipital hemorrhagic stroke Stroke [facility]) (2013); Arthritis; Hyperlipidemia; Hypertension; and Seizures ([place]). followup after recent hospital admission in August for severe headache and seizures. LP negative for meningitis, Keppra dose increase to 500 mg twice daily without further seizure events but had paranoia which has improved  somewhat after reducing the Keppra dose back to 250 twice daily.   Plan :  I had a long discussion with the patient and husband regarding her remote complex partial seizures and her persistent paranoid delusions despite the low dose of Keppra. I recommend tapering the Keppra to 250 mg daily for one week and discontinue start on 09/01/16. I will replace Keppra with Depakote ER 500 mg daily. She will stay on aspirin for stroke prevention and maintain strict control of diabetes with hemoglobin A1c goal below 6.5, lipids with LDL cholesterol goal below 70 mg percent and hypertension with blood pressure goal below 130/90. Greater than 50% time during this 25 minute visit was spent on counseling and coordination of care about seizures and paranoiaReturn  for follow-up in a year or call earlier if necessary. Antony Contras, MD [facility] [address] [place], [place] [address] [phone]

## 2016-08-28 ENCOUNTER — Other Ambulatory Visit: Payer: Self-pay

## 2016-08-28 ENCOUNTER — Telehealth: Payer: Self-pay | Admitting: Neurology

## 2016-08-28 DIAGNOSIS — G40209 Localization-related (focal) (partial) symptomatic epilepsy and epileptic syndromes with complex partial seizures, not intractable, without status epilepticus: Secondary | ICD-10-CM

## 2016-08-28 MED ORDER — DIVALPROEX SODIUM ER 500 MG PO TB24: 500.0000 mg | ORAL_TABLET | Freq: Every day | ORAL | 3 refills | Status: DC

## 2016-08-28 NOTE — Telephone Encounter (Signed)
Pt's husband called said levETIRAcetam (KEPPRA) 250 MG tablet was called to pharmacy but she already has that medication. He said the divalproex (DEPAKOTE ER) 24 hr tablet 500 mg needs to be [facility]. He was advised to check with the pharmacy this afternoon

## 2016-08-28 NOTE — Telephone Encounter (Signed)
Depakote ER 24 hour tablet was not sent yesterday via electronically. Rn resubmitted medication. [facility]Ed [facility].

## 2016-10-08 ENCOUNTER — Ambulatory Visit: Payer: Federal, State, Local not specified - PPO | Admitting: Podiatry

## 2016-11-15 ENCOUNTER — Emergency Department (HOSPITAL_COMMUNITY): Payer: Medicare Other

## 2016-11-15 ENCOUNTER — Inpatient Hospital Stay (HOSPITAL_COMMUNITY)
Admission: EM | Admit: 2016-11-15 | Discharge: 2016-11-21 | DRG: 872 | Disposition: A | Discharge status: Home Health | Payer: Medicare Other | Attending: Internal Medicine | Admitting: Internal Medicine

## 2016-11-15 ENCOUNTER — Encounter (HOSPITAL_COMMUNITY): Payer: Self-pay

## 2016-11-15 DIAGNOSIS — B9689 Other specified bacterial agents as the cause of diseases classified elsewhere: Secondary | ICD-10-CM

## 2016-11-15 DIAGNOSIS — I4510 Unspecified right bundle-branch block: Secondary | ICD-10-CM | POA: Diagnosis not present

## 2016-11-15 DIAGNOSIS — I69319 Unspecified symptoms and signs involving cognitive functions following cerebral infarction: Secondary | ICD-10-CM

## 2016-11-15 DIAGNOSIS — Z91040 Latex allergy status: Secondary | ICD-10-CM

## 2016-11-15 DIAGNOSIS — F05 Delirium due to known physiological condition: Secondary | ICD-10-CM | POA: Diagnosis not present

## 2016-11-15 DIAGNOSIS — R410 Disorientation, unspecified: Secondary | ICD-10-CM | POA: Diagnosis not present

## 2016-11-15 DIAGNOSIS — E1122 Type 2 diabetes mellitus with diabetic chronic kidney disease: Secondary | ICD-10-CM | POA: Diagnosis present

## 2016-11-15 DIAGNOSIS — N390 Urinary tract infection, site not specified: Secondary | ICD-10-CM | POA: Diagnosis present

## 2016-11-15 DIAGNOSIS — R000 Tachycardia, unspecified: Secondary | ICD-10-CM | POA: Diagnosis present

## 2016-11-15 DIAGNOSIS — R440 Auditory hallucinations: Secondary | ICD-10-CM | POA: Diagnosis not present

## 2016-11-15 DIAGNOSIS — R4020 Unspecified coma: Secondary | ICD-10-CM | POA: Diagnosis present

## 2016-11-15 DIAGNOSIS — N12 Tubulo-interstitial nephritis, not specified as acute or chronic: Secondary | ICD-10-CM

## 2016-11-15 DIAGNOSIS — K219 Gastro-esophageal reflux disease without esophagitis: Secondary | ICD-10-CM | POA: Diagnosis present

## 2016-11-15 DIAGNOSIS — I700 Atherosclerosis of aorta: Secondary | ICD-10-CM | POA: Diagnosis present

## 2016-11-15 DIAGNOSIS — Z833 Family history of diabetes mellitus: Secondary | ICD-10-CM

## 2016-11-15 DIAGNOSIS — I951 Orthostatic hypotension: Secondary | ICD-10-CM | POA: Diagnosis present

## 2016-11-15 DIAGNOSIS — I129 Hypertensive chronic kidney disease with stage 1 through stage 4 chronic kidney disease, or unspecified chronic kidney disease: Secondary | ICD-10-CM | POA: Diagnosis present

## 2016-11-15 DIAGNOSIS — Z7982 Long term (current) use of aspirin: Secondary | ICD-10-CM

## 2016-11-15 DIAGNOSIS — F0391 Unspecified dementia with behavioral disturbance: Secondary | ICD-10-CM | POA: Diagnosis not present

## 2016-11-15 DIAGNOSIS — R55 Syncope and collapse: Secondary | ICD-10-CM

## 2016-11-15 DIAGNOSIS — A4151 Sepsis due to Escherichia coli [E. coli]: Secondary | ICD-10-CM | POA: Diagnosis not present

## 2016-11-15 DIAGNOSIS — G40909 Epilepsy, unspecified, not intractable, without status epilepticus: Secondary | ICD-10-CM

## 2016-11-15 DIAGNOSIS — I69351 Hemiplegia and hemiparesis following cerebral infarction affecting right dominant side: Secondary | ICD-10-CM | POA: Diagnosis not present

## 2016-11-15 DIAGNOSIS — N183 Chronic kidney disease, stage 3 unspecified: Secondary | ICD-10-CM | POA: Diagnosis present

## 2016-11-15 DIAGNOSIS — N289 Disorder of kidney and ureter, unspecified: Secondary | ICD-10-CM

## 2016-11-15 DIAGNOSIS — Z794 Long term (current) use of insulin: Secondary | ICD-10-CM

## 2016-11-15 DIAGNOSIS — N179 Acute kidney failure, unspecified: Secondary | ICD-10-CM | POA: Diagnosis present

## 2016-11-15 DIAGNOSIS — I69318 Other symptoms and signs involving cognitive functions following cerebral infarction: Secondary | ICD-10-CM

## 2016-11-15 DIAGNOSIS — G40409 Other generalized epilepsy and epileptic syndromes, not intractable, without status epilepticus: Secondary | ICD-10-CM | POA: Diagnosis present

## 2016-11-15 DIAGNOSIS — Z888 Allergy status to other drugs, medicaments and biological substances status: Secondary | ICD-10-CM

## 2016-11-15 DIAGNOSIS — G40209 Localization-related (focal) (partial) symptomatic epilepsy and epileptic syndromes with complex partial seizures, not intractable, without status epilepticus: Secondary | ICD-10-CM | POA: Diagnosis present

## 2016-11-15 DIAGNOSIS — Z803 Family history of malignant neoplasm of breast: Secondary | ICD-10-CM

## 2016-11-15 DIAGNOSIS — Z79899 Other long term (current) drug therapy: Secondary | ICD-10-CM

## 2016-11-15 DIAGNOSIS — Z809 Family history of malignant neoplasm, unspecified: Secondary | ICD-10-CM

## 2016-11-15 DIAGNOSIS — M1990 Unspecified osteoarthritis, unspecified site: Secondary | ICD-10-CM | POA: Diagnosis present

## 2016-11-15 DIAGNOSIS — F0390 Unspecified dementia without behavioral disturbance: Secondary | ICD-10-CM | POA: Diagnosis present

## 2016-11-15 DIAGNOSIS — B9620 Unspecified Escherichia coli [E. coli] as the cause of diseases classified elsewhere: Secondary | ICD-10-CM | POA: Diagnosis not present

## 2016-11-15 DIAGNOSIS — E118 Type 2 diabetes mellitus with unspecified complications: Secondary | ICD-10-CM

## 2016-11-15 DIAGNOSIS — R7881 Bacteremia: Secondary | ICD-10-CM | POA: Diagnosis present

## 2016-11-15 DIAGNOSIS — Z800 Family history of malignant neoplasm of digestive organs: Secondary | ICD-10-CM

## 2016-11-15 DIAGNOSIS — R251 Tremor, unspecified: Secondary | ICD-10-CM | POA: Diagnosis present

## 2016-11-15 DIAGNOSIS — Z8379 Family history of other diseases of the digestive system: Secondary | ICD-10-CM

## 2016-11-15 DIAGNOSIS — E785 Hyperlipidemia, unspecified: Secondary | ICD-10-CM | POA: Diagnosis present

## 2016-11-15 DIAGNOSIS — E876 Hypokalemia: Secondary | ICD-10-CM | POA: Diagnosis not present

## 2016-11-15 HISTORY — DX: Other chronic pain: G89.29

## 2016-11-15 HISTORY — DX: Anemia, unspecified: D64.9

## 2016-11-15 HISTORY — DX: Major depressive disorder, single episode, unspecified: F32.9

## 2016-11-15 HISTORY — DX: Depression, unspecified: F32.A

## 2016-11-15 HISTORY — DX: Chronic kidney disease, stage 3 unspecified: N18.30

## 2016-11-15 HISTORY — DX: Type 2 diabetes mellitus without complications: E11.9

## 2016-11-15 HISTORY — DX: Dorsalgia, unspecified: M54.9

## 2016-11-15 HISTORY — DX: Chronic kidney disease, stage 3 (moderate): N18.3

## 2016-11-15 HISTORY — DX: Pure hypercholesterolemia, unspecified: E78.00

## 2016-11-15 LAB — CBC
HEMATOCRIT: 37.6 % (ref 36.0–46.0)
HEMOGLOBIN: 12.4 g/dL (ref 12.0–15.0)
MCH: 32.4 pg (ref 26.0–34.0)
MCHC: 33 g/dL (ref 30.0–36.0)
MCV: 98.2 fL (ref 78.0–100.0)
Platelets: 228 10*3/uL (ref 150–400)
RBC: 3.83 MIL/uL — ABNORMAL LOW (ref 3.87–5.11)
RDW: 13.1 % (ref 11.5–15.5)
WBC: 23.1 10*3/uL — AB (ref 4.0–10.5)

## 2016-11-15 LAB — URINALYSIS, ROUTINE W REFLEX MICROSCOPIC
BILIRUBIN URINE: NEGATIVE
Glucose, UA: 50 mg/dL — AB
Hgb urine dipstick: NEGATIVE
KETONES UR: NEGATIVE mg/dL
NITRITE: NEGATIVE
PROTEIN: NEGATIVE mg/dL
SQUAMOUS EPITHELIAL / LPF: NONE SEEN
Specific Gravity, Urine: 1.009 (ref 1.005–1.030)
pH: 8 (ref 5.0–8.0)

## 2016-11-15 LAB — HEPATIC FUNCTION PANEL
ALK PHOS: 135 U/L — AB (ref 38–126)
ALT: 13 U/L — AB (ref 14–54)
AST: 25 U/L (ref 15–41)
Albumin: 2.9 g/dL — ABNORMAL LOW (ref 3.5–5.0)
BILIRUBIN DIRECT: 0.1 mg/dL (ref 0.1–0.5)
BILIRUBIN INDIRECT: 0.7 mg/dL (ref 0.3–0.9)
BILIRUBIN TOTAL: 0.8 mg/dL (ref 0.3–1.2)
Total Protein: 6.8 g/dL (ref 6.5–8.1)

## 2016-11-15 LAB — BASIC METABOLIC PANEL
ANION GAP: 12 (ref 5–15)
BUN: 37 mg/dL — AB (ref 6–20)
CHLORIDE: 102 mmol/L (ref 101–111)
CO2: 28 mmol/L (ref 22–32)
Calcium: 9 mg/dL (ref 8.9–10.3)
Creatinine, Ser: 1.78 mg/dL — ABNORMAL HIGH (ref 0.44–1.00)
GFR calc Af Amer: 31 mL/min — ABNORMAL LOW (ref >60)
GFR, EST NON AFRICAN AMERICAN: 27 mL/min — AB (ref >60)
GLUCOSE: 208 mg/dL — AB (ref 65–99)
POTASSIUM: 4.4 mmol/L (ref 3.5–5.1)
SODIUM: 142 mmol/L (ref 135–145)

## 2016-11-15 LAB — CBG MONITORING, ED: GLUCOSE-CAPILLARY: 191 mg/dL — AB (ref 65–99)

## 2016-11-15 LAB — I-STAT TROPONIN, ED: TROPONIN I, POC: 0.01 ng/mL (ref 0.00–0.08)

## 2016-11-15 LAB — GLUCOSE, CAPILLARY: Glucose-Capillary: 312 mg/dL — ABNORMAL HIGH (ref 65–99)

## 2016-11-15 LAB — TROPONIN I: Troponin I: 0.03 ng/mL (ref <0.03)

## 2016-11-15 LAB — PHOSPHORUS: PHOSPHORUS: 3 mg/dL (ref 2.5–4.6)

## 2016-11-15 LAB — TSH: TSH: 0.632 u[IU]/mL (ref 0.350–4.500)

## 2016-11-15 LAB — MAGNESIUM: Magnesium: 1.8 mg/dL (ref 1.7–2.4)

## 2016-11-15 LAB — VALPROIC ACID LEVEL: VALPROIC ACID LVL: 28 ug/mL — AB (ref 50.0–100.0)

## 2016-11-15 MED ORDER — SENNOSIDES-DOCUSATE SODIUM 8.6-50 MG PO TABS
1.0000 | ORAL_TABLET | Freq: Every day | ORAL | Status: DC
Start: 2016-11-15 — End: 2016-11-21
  Administered 2016-11-15 – 2016-11-20 (×6): 1 via ORAL
  Filled 2016-11-15 (×6): qty 1

## 2016-11-15 MED ORDER — ONDANSETRON HCL 4 MG/2ML IJ SOLN
4.0000 mg | Freq: Four times a day (QID) | INTRAMUSCULAR | Status: DC | PRN
  Filled 2016-11-15: qty 2

## 2016-11-15 MED ORDER — DEXTROSE 5 % IV SOLN
1.0000 g | INTRAVENOUS | Status: DC
  Filled 2016-11-15 (×2): qty 10

## 2016-11-15 MED ORDER — PANTOPRAZOLE SODIUM 40 MG PO TBEC
40.0000 mg | DELAYED_RELEASE_TABLET | Freq: Every day | ORAL | Status: DC
  Administered 2016-11-15 – 2016-11-21 (×7): 40 mg via ORAL
  Filled 2016-11-15 (×7): qty 1

## 2016-11-15 MED ORDER — ATORVASTATIN CALCIUM 10 MG PO TABS
10.0000 mg | ORAL_TABLET | Freq: Every day | ORAL | Status: DC
  Administered 2016-11-15 – 2016-11-20 (×6): 10 mg via ORAL
  Filled 2016-11-15 (×6): qty 1

## 2016-11-15 MED ORDER — ACETAMINOPHEN 650 MG RE SUPP: 650.0000 mg | Freq: Four times a day (QID) | RECTAL | Status: DC | PRN

## 2016-11-15 MED ORDER — DILTIAZEM HCL 60 MG PO TABS
90.0000 mg | ORAL_TABLET | Freq: Two times a day (BID) | ORAL | Status: DC
  Administered 2016-11-15 – 2016-11-21 (×12): 90 mg via ORAL
  Filled 2016-11-15 (×13): qty 1

## 2016-11-15 MED ORDER — HEPARIN SODIUM (PORCINE) 5000 UNIT/ML IJ SOLN
5000.0000 [IU] | Freq: Three times a day (TID) | INTRAMUSCULAR | Status: DC
  Administered 2016-11-15 – 2016-11-20 (×16): 5000 [IU] via SUBCUTANEOUS
  Filled 2016-11-15 (×16): qty 1

## 2016-11-15 MED ORDER — SODIUM CHLORIDE 0.9 % IV SOLN
INTRAVENOUS | Status: AC
  Administered 2016-11-15 – 2016-11-16 (×2): via INTRAVENOUS

## 2016-11-15 MED ORDER — FLUOCINONIDE 0.05 % EX CREA
1.0000 "application " | TOPICAL_CREAM | Freq: Every day | CUTANEOUS | Status: DC
  Administered 2016-11-15 – 2016-11-20 (×6): 1 via TOPICAL
  Filled 2016-11-15: qty 30

## 2016-11-15 MED ORDER — DEXTROSE 5 % IV SOLN
750.0000 mg | Freq: Once | INTRAVENOUS | Status: AC
  Administered 2016-11-15: 750 mg via INTRAVENOUS
  Filled 2016-11-15: qty 7.5

## 2016-11-15 MED ORDER — DIVALPROEX SODIUM ER 500 MG PO TB24
750.0000 mg | ORAL_TABLET | Freq: Every day | ORAL | Status: DC
  Administered 2016-11-16 – 2016-11-21 (×6): 750 mg via ORAL
  Filled 2016-11-15 (×6): qty 1

## 2016-11-15 MED ORDER — ONDANSETRON HCL 4 MG PO TABS: 4.0000 mg | ORAL_TABLET | Freq: Four times a day (QID) | ORAL | Status: DC | PRN

## 2016-11-15 MED ORDER — ASPIRIN EC 81 MG PO TBEC
81.0000 mg | DELAYED_RELEASE_TABLET | Freq: Every day | ORAL | Status: DC
  Administered 2016-11-15 – 2016-11-21 (×7): 81 mg via ORAL
  Filled 2016-11-15 (×7): qty 1

## 2016-11-15 MED ORDER — ACETAMINOPHEN 325 MG PO TABS
650.0000 mg | ORAL_TABLET | Freq: Four times a day (QID) | ORAL | Status: DC | PRN
  Administered 2016-11-16 – 2016-11-21 (×6): 650 mg via ORAL
  Filled 2016-11-15 (×6): qty 2

## 2016-11-15 MED ORDER — METOPROLOL TARTRATE 25 MG PO TABS
25.0000 mg | ORAL_TABLET | Freq: Two times a day (BID) | ORAL | Status: DC
  Administered 2016-11-15 – 2016-11-21 (×12): 25 mg via ORAL
  Filled 2016-11-15 (×12): qty 1

## 2016-11-15 MED ORDER — DIVALPROEX SODIUM ER 500 MG PO TB24: 500.0000 mg | ORAL_TABLET | Freq: Every day | ORAL | Status: DC

## 2016-11-15 NOTE — Progress Notes (Signed)
Report received from [place] in the ED at [place].

## 2016-11-15 NOTE — H&P (Signed)
Date: 11/15/2016               Patient Name:  [name] [name] MRN: [id number]  DOB: May 10, 1941 Age / Sex: 76 y.o., female   PCP: [name], MD         Medical Service: Internal Medicine Teaching Service         Attending Physician: Dr. Axel Filler, MD    First Contact: Dr. Einar Gip, DO Pager: [phone]  Second Contact: Dr. 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 The patient has no recollection of the events that transpired however per the patients husband, he reports that he was assisting her sit down on the toilet when her eyes rolled back in her head, her head drooped and she lost consciousness. He then laid her down on the floor and noticed some shaking. He then called EMS. Per husband she did not have significant confusion after the episode however difficult to ascertain given her baseline confusion. She admits to feeling weak, dizzy, having lower abdominal pain and dysuria since yesterday. Otherwise she denies HA, chest pain, SOB, productive cough, fevers, chills, or worsening neurologic function. Has not had a seizure in several years per patient.    In the emergency department, vital signs showed a temperature of 98.9*, pulse of 104, BP 139/55, respirations 16 and she was saturating 95% on RA. CXR without acute CP disease. CT head without contrast negative for acute abnormality. BMET significant for creatinine of 1.8 (baseline 0.9). CBC significant for leukocytosis of 23. Troponin negative. UA suggestive of  infection with TNTC WBCs, moderate leukocytes and rare bacteria. Valproic acid level low at 28. She was subsequently admitted for evaluation of syncope and treatment of UTI.   Meds:  Current Meds  Medication Sig  . acetaminophen (TYLENOL) 325 MG tablet Take 650 mg by mouth every 6 (six) hours as needed for moderate pain or headache.   Marland Kitchen aspirin 81 MG tablet Take 81 mg by mouth daily.  Marland Kitchen atorvastatin (LIPITOR) 10 MG tablet Take 10 mg by mouth daily.   Marland Kitchen diltiazem (CARDIZEM) 90 MG tablet Take 90 mg by mouth 2 (two) times daily.   . divalproex (DEPAKOTE ER) 500 MG 24 hr tablet Take 1 tablet (500 mg total) by mouth daily.  . fluocinonide cream (LIDEX) [id number] % Apply 1 application topically daily.  . furosemide (LASIX) 20 MG tablet Take 20 mg by mouth daily.   . hydrOXYzine (ATARAX/VISTARIL) 25 MG tablet TAKE 1 TABLET BY MOUTH AT BEDTIME AS NEEDED FOR ITCHING  . LEVEMIR FLEXTOUCH 100 UNIT/ML Pen INJECT 10 UNITS SUBCUTANEOUSLY DAILY AT NIGHT  . Magnesium 500 MG TABS Take 1 tablet (500 mg total) by mouth 3 (three) times daily. (Patient taking differently: Take 500 mg by mouth 2 (two) times daily. )  . metoprolol tartrate (LOPRESSOR) 25 MG tablet Take 25 mg by mouth 2 (two) times daily.  . Multiple Vitamin (MULTIVITAMIN) capsule Take 1 capsule by mouth daily.  Marland Kitchen NOVOLOG FLEXPEN 100 UNIT/ML SOPN FlexPen  Inject 4-9 Units into the skin 3 (three) times daily with meals. Per sliding scale, 4 units if over 200 = 5 units in the morning and at lunch and 8 units if over 200 = 9 units.  Marland Kitchen omeprazole (PRILOSEC) 20 MG capsule Take 20 mg by mouth daily.  . vitamin C (ASCORBIC ACID) 500 MG tablet Take 500 mg by mouth daily.    Allergies: Allergies as of 11/15/2016 - Review Complete 11/15/2016  Allergen Reaction Noted  . Barbiturates  08/18/2012  . Latex Itching 06/25/2013   Past Medical History:  Diagnosis Date  . Arthritis   . Cataract   . Diabetes mellitus   . GERD (gastroesophageal reflux disease)   .  Hyperlipidemia   . Hypertension   . Seizures ([place])    last seizure 2014  . Stroke [facility]) 2013   Secondary to acute right posterior temporo-occipital intra-axial hemorrhage   Family History: Mother: colon and breast cancer. Liver disease Father: Cancer  Social History: Has never smoked. Does not drink or use recreational drugs. Lives at home with her husband and child who reports is supportive. Husband manages all medications and assists with ADLs.   Review of Systems: A complete ROS was negative except as per HPI.   Physical Exam: Blood pressure (!) 136/50, pulse 97, temperature 98.9 F (37.2 C), temperature source Oral, resp. rate 17, height 5\' 5"  (1.651 m), weight 144 lb (65.3 kg), SpO2 93 %. General: Elderly caucasian female resting comfortably on stretcher. In no acute distress. Very pleasant.  HENT: PERRL. EOMI. No nystagmus appreciated. No conjunctival injection, icterus or ptosis. Mucous membranes DRY. Cataracts.  Cardiovascular: Regular rate and rhythm. No rub appreciated. Pulmonary: CTA BL, no wheezing, crackles or rhonchi appreciated. Unlabored breathing and had good air-flow. Abdomen: Soft with suprapubic tenderness to palpation. Not distended and was without rigidity or guarding. +bowel sounds Extremities: No peripheral edema noted BL. Intact distal pulses. No gross deformities. Skin: Pale. No cyanosis.   Neuro: Alert and oriented x4 (person, place, date, city). Strength testing shows 5/5 strength throughout. Sensation grossly intact. Facial muscles symmetric. No tongue deviation with protrusion. Symmetric rise of uvula. Normal finger-to-nose testing BL. Resting tremor noted throughout, most prominent in RUE Psych: Mood normal and affect was mood congruent. Some confusion during examination.   EKG: Sinus tachycardia, rate 100. RBBB. Most recent EKG in system from 2014, shows incomplete RBBB.  CXR reviewed by myself: No acute cardiopulmonary disease. Aortic atherosclerosis  noted.   CT head: no acute intracranial abnormality. Cerebral atrophy and small vessel ischemic changes. Chronic soft tissue density superficial to left maxillary sinus  CMP Latest Ref Rng & Units 11/15/2016 07/08/2013 07/07/2013  Glucose 65 - 99 mg/dL 208(H) 138(H) 197(H)  BUN 6 - 20 mg/dL 37(H) 15 18  Creatinine 0.44 - 1.00 mg/dL 1.78(H) 0.93 0.88  Sodium 135 - 145 mmol/L 142 143 142  Potassium 3.5 - 5.1 mmol/L 4.4 3.6 3.6  Chloride 101 - 111 mmol/L 102 103 101  CO2 22 - 32 mmol/L 28 29 30   Calcium 8.9 - 10.3 mg/dL 9.0 7.5(L) 7.8(L)  Total Protein 6.0 - 8.3 g/dL - 5.8(L) 5.9(L)  Total Bilirubin 0.3 - 1.2 mg/dL - 0.4 0.5  Alkaline Phos 39 - 117 U/L - 105 113  AST 0 - 37 U/L - 24 22  ALT 0 - 35 U/L - 15 15   CBC Latest Ref Rng & Units 11/15/2016 07/08/2013 07/07/2013  WBC 4.0 - 10.5 K/uL 23.1(H) 9.7 10.3  Hemoglobin 12.0 -  15.0 g/dL 12.4 8.6(L) 9.1(L)  Hematocrit 36.0 - 46.0 % 37.6 26.3(L) 27.5(L)  Platelets 150 - 400 K/uL 228 369 333   Urinalysis: TNTC WBC, moderate leukocytes, rare bacteria  Assessment & Plan by Problem: This is a very pleasant 76 year old woman presenting with syncopal episode. Patient has no recollection of event however husband reports she was on the toilet when her eyes rolled back and she lost consciousness and soon after noticed some shaking. Pt has history of seizure disorder, on valproic acid with a subtherapeutic level and also was found to have a UTI.   Principal Problem:   Syncope Active Problems:   DM type 2 (diabetes mellitus, type 2) (HCC)   CKD (chronic kidney disease) stage 3, GFR 30-59 ml/min   Seizure disorder, grand mal ([place])   Aortic atherosclerosis (HCC)   UTI (urinary tract infection)  Syncope, Seizure Disorder ? Seizure vs syncope. ?PD. Husband reported LOC followed by shaking while urinating. Chart review shows that patient has a history of micturation-associated syncopal episodes, most recently documented in 2014 after thorough work-up for  syncope.  Depakote/valproic acid level subtheraputic at 29 despite reported compliance with 500mg  daily. Per patient she has not had a seizure in several years. Recently followed up with her neurologist 08/2016 who changed her regimen from [place] --> Depakote due to hallucinations.  -Continue Depakote 500 mg daily -CT head negative -Neurology consulted and we appreciate their advice in the management of this patient. -PT/OT -Seizure precautions -Trending troponins -TSH -Orthostats -Mag and phos levels normal (has history of significant deficiency)  -ECHO  Urinary Tract Infection UA results, leukocytosis and history of dysuria + 1 day of worsening weakness + chills consistent with diagnosis. Will treat with Rocephin and will narrow based on c&s results.  -Rocephin per pharm -Follow up urine and blood cultures -CMET and CBC in am  History of hemorrhagic CVA with residual cognitive impairment and right sided weakness Pt reports her deficits are at baseline and exam without new focal neuro deficits. Patient does demonstrate some confusion during examination however has confusion at baseline.  -ASA 81 mg daily -Lipitor 10 mg  Type 2 Diabetes Reports compliance with Levemir 10 units QHS + sliding scale insulin  Hypertension Controlled presently. Continue Cardizem 90mg , Lopressor 25 mg   Diet: Heart healthy IVF: Ns @ 130ml/hr DVT Prophylaxis: Heparin Code status: FULL  Dispo: Admit patient to Observation with expected length of stay less than 2 midnights.  SignedEinar Gip, DO 11/15/2016, 3:47 PM  Pager: [phone]

## 2016-11-15 NOTE — Consult Note (Signed)
Neurology Consultation Reason for Consult: Syncope Referring Physician: Internal Medicine.  CC: Syncope  History is obtained from: Patient and chart review  HPI: [name] [name] is a 76 y.o. female presented with a syncopal episode at home. Her husband not at bedside and patient has no recollection of th event. She reports that her husband told her she passed out on the toilet. The only thing she remembers prior to the episode was palpitations without any chest pain or shortness of breath. She remembers being in the bathroom on the floor. When she woke up she denied any confusion or loss of bladder bladder control. No tongue biting during this episode.  On chart review patient had a hemorrhagic stroke in 2013 after which it had 4 seizures in 2014 required admission to the hospital. She initially was on Keppra which was causing side effects such as mood disorder and hallucinations. This was slowly weaned off in October 2017 and Depakote 500 mg daily extended release was initiated. Patient has severe dementia with poor memory and unable to tell me her medications at this time.  ROS: A 14 point ROS was performed and is negative except as noted in the HPI.   Past Medical History:  Diagnosis Date  . Arthritis   . Cataract   . Diabetes mellitus   . GERD (gastroesophageal reflux disease)   . Hyperlipidemia   . Hypertension   . Seizures ([place])    last seizure 2014  . Stroke [facility]) 2013   Secondary to acute right posterior temporo-occipital intra-axial hemorrhage    Family History  Problem Relation Age of Onset  . Cancer Mother   . Colon cancer Mother   . Liver disease Mother   . Breast cancer Mother   . Cancer Father   . Diabetes Paternal Grandmother   . Esophageal cancer Neg Hx   . Rectal cancer Neg Hx   . Stomach cancer Neg Hx    Social History:  reports that she has never smoked. She has never used smokeless tobacco. She reports that she does not drink alcohol or use  drugs.  Exam: Current vital signs: BP (!) 149/66 (BP Location: Right Arm)   Pulse (!) 105   Temp 98.3 F (36.8 C) (Oral)   Resp 20   Ht 5\' 5"  (1.651 m)   Wt 69.6 kg (153 lb 6.4 oz)   SpO2 96%   BMI 25.53 kg/m  Vital signs in last 24 hours: Temp:  [98.3 F (36.8 C)-98.9 F (37.2 C)] 98.3 F (36.8 C) (01/13 1734) Pulse Rate:  [97-105] 105 (01/13 1734) Resp:  [16-21] 20 (01/13 1734) BP: (127-149)/(50-66) 149/66 (01/13 1734) SpO2:  [93 %-96 %] 96 % (01/13 1734) Weight:  [65.3 kg (144 lb)-69.6 kg (153 lb 6.4 oz)] 69.6 kg (153 lb 6.4 oz) (01/13 1734)   Physical Exam  Constitutional: Appears well-developed and well-nourished.  Psych: Affect appropriate to situation Eyes: No scleral injection HENT: No neck rigidity. No carotid bruit was appreciated. Head: Normocephalic.  Cardiovascular: Normal rate and regular rhythm.  Respiratory: Effort normal and breath sounds normal to anterior ascultation GI: Soft.  No distension. There is no tenderness.   Neuro: Mental Status: Patient is awake, alert, oriented to person, place, month, year, and situation Patient is able to give a clear and coherent history. No signs of aphasia or neglect Cranial Nerves: II: Visual Fields are full. Pupils are equal, round, and reactive to light. III,IV, VI: EOMI without ptosis or diploplia.  V: Facial sensation is symmetric  to temperature VII: Facial movement is symmetric.  VIII: hearing is intact to voice X: Uvula elevates symmetrically XI: Shoulder shrug is symmetric. XII: tongue is midline without atrophy or fasciculations.  Motor: Tone is normal. Bulk is normal. 5/5 strength was present in all four extremities. There is moderate resting tremor noted right more than left upper extremity. Similarly bilateral Heloise Beecham is right more than left resting tremor was noted. There is some bradykinesia in bilateral upper extremities Sensory: Sensation is symmetric to light touch. Decreased sensation to  temperature in left dependent fashion. Vibratory sensation approximately 2 seconds at toes and 6 seconds at knees. Deep Tendon Reflexes: 2+ and symmetric except absent in the Achilles. Plantars: Toes are downgoing bilaterally. Cerebellar: FNF and HKS are intact bilaterally Gait: Some bradykinesia with shuffling was noted. She also exhibited turning en bloc  Impression:  76 year old female presented with syncopal episode. This is different compared to her seizures although Depakote level is subtherapeutic however this episode is not consistent with a partial seizure. She has had multiple episodes of syncope in the past specifically related to micturition. Ddx include orthostatic hypotension/vasovagal vs cardiogenic vs parkinson's related orthostatic.  Recommendations: -Ordered orthostatic vitals. -Loading with 750mg  of IV valproic acid to achieve a level of close to 80. -Will increase the maintenance dose of VPA to 750mg  daily Er. -No need for EEG at this time.

## 2016-11-15 NOTE — ED Notes (Signed)
Patient transported to X-ray 

## 2016-11-15 NOTE — ED Notes (Signed)
EDP at bedside  

## 2016-11-15 NOTE — ED Triage Notes (Signed)
Pt. Coming from home via [facility] for syncope. Pt. Husband reports she was walking to restroom. After a while he went to check on her and found her  Unconscious. Pt. Aox4 with EMS. Pt. Given 645mL normal saline because pt. Ortho static. Pt. Placed in c-collar precaution. Pt. Denies any pain.

## 2016-11-15 NOTE — ED Notes (Signed)
Pt. Ambulatory to restroom with steady gait. Stand by assist.

## 2016-11-15 NOTE — ED Notes (Signed)
Admitting at bedside 

## 2016-11-15 NOTE — ED Provider Notes (Signed)
[place] DEPT Provider Note   CSN: [id number] Arrival date & time: 11/15/16  1008     History   Chief Complaint Chief Complaint  Patient presents with  . Loss of Consciousness  t presents HPI [name] [name] is a 76 y.o. female.  HPI Patient presents with syncopal episode. Patient reportedly was on the toilet and eyes rolled back in her head and she became unresponsive. Lowered to floor by husband. Has been doing well last couple days. Patient does not really remember the episode. Previous history of stroke and of seizures. Had some shaking with the episode but not like the previous seizure activity that she has been having. No real confusion after the episode. He does have some mild baseline confusion.    Past Medical History:  Diagnosis Date  . Arthritis   . Cataract   . Diabetes mellitus   . GERD (gastroesophageal reflux disease)   . Hyperlipidemia   . Hypertension   . Seizures ([place])    last seizure 2014  . Stroke [facility]) 2013   Secondary to acute right posterior temporo-occipital intra-axial hemorrhage    Patient Active Problem List   Diagnosis Date Noted  . Paranoia ([place]) 05/24/2015  . Tachycardia 07/02/2013  . Hypophosphatemia 07/02/2013  . Encounter for central line placement 07/02/2013  . Hypokalemia 07/01/2013  . Hypomagnesemia 07/01/2013  . Protein-calorie malnutrition, severe ([place]) 06/29/2013  . Leukocytosis, unspecified 06/29/2013  . 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 TIA (transient ischemic attack) 12/13/2012  . 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 Syncope 09/08/2012  . Acute respiratory failure with hypoxia ([place]) 08/20/2012  . Seizure disorder, grand mal ([place]) 08/20/2012  . Seizure ([place]) 08/20/2012  . CKD (chronic kidney disease) stage 3, GFR 30-59 ml/min 08/19/2012  . Leukocytosis 08/19/2012  . Hemorrhage in the brain-13 x 22 x 14 mm right posterior temporo-occipital intra-axial acute 08/19/2012  . Left hip pain  08/19/2012  . Left hemiparesis ([place]) 08/19/2012  . Hemianopia, homonymous, left 08/19/2012  . Hemisensory deficit, left 08/19/2012  . Nausea 08/19/2012  . Headache(784.0) 08/19/2012  . Encephalopathy acute 08/19/2012  . Hemorrhage of brain, nontraumatic ([place]) 08/18/2012  . Dysphagia 08/18/2012  . HTN (hypertension) 08/18/2012  . DM type 2 (diabetes mellitus, type 2) ([place]) 07/26/2012  . Hypoglycemia due to insulin 07/26/2012  . GERD (gastroesophageal reflux disease)     Past Surgical History:  Procedure Laterality Date  . CATARACT EXTRACTION     both eyes  . COLONOSCOPY    . 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 Authorizing Provider  acetaminophen (TYLENOL) 325 MG tablet Take 650 mg by mouth every 6 (six) hours as needed for moderate pain or headache.    Yes Historical Provider, MD  aspirin 81 MG tablet Take 81 mg by mouth daily.   Yes Historical Provider, MD  atorvastatin (LIPITOR) 10 MG tablet Take 10 mg by mouth daily.  02/11/13  Yes Historical Provider, MD  diltiazem (CARDIZEM) 90 MG tablet Take 90 mg by mouth 2 (two) times daily.  03/17/15  Yes Historical Provider, MD  divalproex (DEPAKOTE ER) 500 MG 24 hr tablet Take 1 tablet (500 mg  total) by mouth daily. 08/28/16  Yes Garvin Fila, MD  fluocinonide cream (LIDEX) [id number] % Apply 1 application topically daily. 10/30/16  Yes Historical Provider, MD  furosemide (LASIX) 20 MG tablet Take 20 mg by mouth daily.  08/06/16  Yes Historical Provider, MD  hydrOXYzine (ATARAX/VISTARIL) 25 MG tablet TAKE 1 TABLET BY MOUTH AT BEDTIME AS NEEDED FOR ITCHING 04/25/15  Yes Historical Provider, MD  LEVEMIR FLEXTOUCH 100 UNIT/ML Pen INJECT 10 UNITS SUBCUTANEOUSLY DAILY AT  NIGHT 05/14/15  Yes Historical Provider, MD  Magnesium 500 MG TABS Take 1 tablet (500 mg total) by mouth 3 (three) times daily. Patient taking differently: Take 500 mg by mouth 2 (two) times daily.  07/08/13  Yes Nita Sells, MD  metoprolol tartrate (LOPRESSOR) 25 MG tablet Take 25 mg by mouth 2 (two) times daily.   Yes Historical Provider, MD  Multiple Vitamin (MULTIVITAMIN) capsule Take 1 capsule by mouth daily.   Yes Historical Provider, MD  NOVOLOG FLEXPEN 100 UNIT/ML SOPN FlexPen Inject 4-9 Units into the skin 3 (three) times daily with meals. Per sliding scale, 4 units if over 200 = 5 units in the morning and at lunch and 8 units if over 200 = 9 units. 07/29/13  Yes Historical Provider, MD  omeprazole (PRILOSEC) 20 MG capsule Take 20 mg by mouth daily. 03/17/15  Yes Historical Provider, MD  vitamin C (ASCORBIC ACID) 500 MG tablet Take 500 mg by mouth daily.   Yes Historical Provider, MD    Family History Family History  Problem Relation Age of Onset  . Cancer Mother   . Colon cancer Mother   . Liver disease Mother   . Breast cancer Mother   . Cancer Father   . Diabetes Paternal Grandmother   . Esophageal cancer Neg Hx   . Rectal cancer Neg Hx   . Stomach cancer Neg Hx     Social History Social History  Substance Use Topics  . Smoking status: Never Smoker  . Smokeless tobacco: Never Used  . Alcohol use No     Comment: patient drinks caffeinated drinks.      Allergies   Barbiturates and Latex   Review of Systems Review of Systems  Constitutional: Negative for activity change and fever.  HENT: Negative for congestion.   Respiratory: Negative for chest tightness.   Cardiovascular: Negative for chest pain.  Gastrointestinal: Negative for abdominal pain and blood in stool.  Endocrine: Negative for polyuria.  Skin: Positive for pallor.  Neurological: Positive for syncope.  Hematological: Negative for adenopathy.  Psychiatric/Behavioral: Negative for confusion.      Physical Exam Updated Vital Signs BP 139/55   Pulse 104   Temp 98.9 F (37.2 C) (Oral)   Resp 16   Ht 5\' 5"  (1.651 m)   Wt 144 lb (65.3 kg)   SpO2 94%   BMI 23.96 kg/m   Physical Exam   ED Treatments / Results  Labs (all labs ordered are listed, but only abnormal results are displayed) Labs Reviewed  BASIC METABOLIC PANEL - Abnormal; Notable for the following:       Result Value   Glucose, Bld 208 (*)    BUN 37 (*)    Creatinine, Ser 1.78 (*)    GFR calc non Af Amer 27 (*)    GFR calc Af Amer 31 (*)    All other components within normal limits  CBC - Abnormal; Notable for the following:    WBC 23.1 (*)    RBC 3.83 (*)  All other components within normal limits  URINALYSIS, ROUTINE W REFLEX MICROSCOPIC - Abnormal; Notable for the following:    Color, Urine STRAW (*)    Glucose, UA 50 (*)    Leukocytes, UA MODERATE (*)    Bacteria, UA RARE (*)    All other components within normal limits  VALPROIC ACID LEVEL - Abnormal; Notable for the following:    Valproic Acid Lvl 28 (*)    All other components within normal limits  CBG MONITORING, ED - Abnormal; Notable for the following:    Glucose-Capillary 191 (*)    All other components within normal limits  I-STAT TROPOININ, ED    EKG  EKG Interpretation  Date/Time:  Saturday November 15 2016 10:17:20 EST Ventricular Rate:  100 PR Interval:    QRS Duration: 135 QT Interval:  377 QTC Calculation: 487 R Axis:   78 Text Interpretation:  Sinus tachycardia Right bundle branch block Confirmed by Alvino Chapel  MD, Maytal Mijangos [id number]) on 11/15/2016 11:00:37 AM       Radiology Dg Chest 2 View  Result Date: 11/15/2016 CLINICAL DATA:  Syncope. EXAM: CHEST  2 VIEW COMPARISON:  07/02/2013 FINDINGS: Mild pectus excavatum deformity. Lateral view degraded by patient arm position. Midline trachea. Normal heart size. Atherosclerosis in the transverse aorta. No pleural effusion or pneumothorax. Biapical pleural thickening. Clear  lungs. IMPRESSION: No acute cardiopulmonary disease. Electronically Signed   By: Abigail Miyamoto M.D.   On: 11/15/2016 11:12   Ct Head Wo Contrast  Result Date: 11/15/2016 CLINICAL DATA:  Syncopal episode.  Old CVA in 2013. EXAM: CT HEAD WITHOUT CONTRAST TECHNIQUE: Contiguous axial images were obtained from the base of the skull through the vertex without intravenous contrast. COMPARISON:  06/25/2013 FINDINGS: Brain: Expected cerebral volume loss for age. Mild low density in the periventricular white matter likely related to small vessel disease. No mass lesion, hemorrhage, hydrocephalus, acute infarct, intra-axial, or extra-axial fluid collection. Vascular: No hyperdense vessel or unexpected calcification. Skull: No significant soft tissue swelling. No skull fracture. Cerumen in both external ear canals. Sinuses/Orbits: Soft tissue fullness anterior to the left maxillary sinus measures 1.8 cm on image 1/series 3 and was present back in 2004. Normal orbits and globes. Clear mastoid air cells. Minimal mucosal thickening of the left maxillary sinus. Other: None IMPRESSION: 1.  No acute intracranial abnormality. 2.  Cerebral atrophy and small vessel ischemic change. 3. Chronic soft tissue density lesion superficial the left maxillary sinus. Consider physical exam correlation. Electronically Signed   By: Abigail Miyamoto M.D.   On: 11/15/2016 12:30    Procedures Procedures (including critical care time)  Medications Ordered in ED Medications - No data to display   Initial Impression / Assessment and Plan / ED Course  I have reviewed the triage vital signs and the nursing notes.  Pertinent labs & imaging results that were available during my care of the patient were reviewed by me and considered in my medical decision making (see chart for details).  Clinical Course     Patient with syncope. Could be vagal but does have elevated creatinine. Unsure of baseline  But is reportedly elevated at baseline.  Nonspecific EKG changes. Negative troponin negative head CT. Likely observation admission. Final Clinical Impressions(s) / ED Diagnoses   Final diagnoses:  Syncope, unspecified syncope type  Renal insufficiency    New Prescriptions New Prescriptions   No medications on file     Davonna Belling, MD 11/15/16 1317

## 2016-11-15 NOTE — ED Notes (Signed)
Pt. Ambulatory with stand-by assist to restroom.

## 2016-11-15 NOTE — Progress Notes (Addendum)
At around 11pm, pt had a fainting episode in the bathroom while she was been assisted by a staff member to the restroom. Pt's heart rate was in the 140's and was unresponsive for a brief moment. Staff at side ensured pulse activity, safety, while attempting to wake pt up. Pt became responsive thereafter and she reported that she had just had a fainting episode. MD was paged. An assisted fall huddle was completed as pt was eased to the floor when she could not bear her weight up. Pt reported no pain. Pt's vitals were completed, CBG done and EKG completed. Pt's neuro status are back to baseline and pt is currently resting. MD came to assess pt. Family was notified and pt will continue to be monitored.

## 2016-11-16 ENCOUNTER — Observation Stay (HOSPITAL_BASED_OUTPATIENT_CLINIC_OR_DEPARTMENT_OTHER): Payer: Medicare Other

## 2016-11-16 DIAGNOSIS — F0390 Unspecified dementia without behavioral disturbance: Secondary | ICD-10-CM | POA: Diagnosis present

## 2016-11-16 DIAGNOSIS — R251 Tremor, unspecified: Secondary | ICD-10-CM | POA: Diagnosis present

## 2016-11-16 DIAGNOSIS — R7881 Bacteremia: Secondary | ICD-10-CM | POA: Diagnosis not present

## 2016-11-16 DIAGNOSIS — G40209 Localization-related (focal) (partial) symptomatic epilepsy and epileptic syndromes with complex partial seizures, not intractable, without status epilepticus: Secondary | ICD-10-CM | POA: Diagnosis present

## 2016-11-16 DIAGNOSIS — Z7982 Long term (current) use of aspirin: Secondary | ICD-10-CM | POA: Diagnosis not present

## 2016-11-16 DIAGNOSIS — I4510 Unspecified right bundle-branch block: Secondary | ICD-10-CM | POA: Diagnosis present

## 2016-11-16 DIAGNOSIS — N390 Urinary tract infection, site not specified: Secondary | ICD-10-CM | POA: Diagnosis present

## 2016-11-16 DIAGNOSIS — I700 Atherosclerosis of aorta: Secondary | ICD-10-CM | POA: Diagnosis present

## 2016-11-16 DIAGNOSIS — I69319 Unspecified symptoms and signs involving cognitive functions following cerebral infarction: Secondary | ICD-10-CM | POA: Diagnosis not present

## 2016-11-16 DIAGNOSIS — Z79899 Other long term (current) drug therapy: Secondary | ICD-10-CM | POA: Diagnosis not present

## 2016-11-16 DIAGNOSIS — E785 Hyperlipidemia, unspecified: Secondary | ICD-10-CM | POA: Diagnosis present

## 2016-11-16 DIAGNOSIS — I129 Hypertensive chronic kidney disease with stage 1 through stage 4 chronic kidney disease, or unspecified chronic kidney disease: Secondary | ICD-10-CM | POA: Diagnosis present

## 2016-11-16 DIAGNOSIS — Z794 Long term (current) use of insulin: Secondary | ICD-10-CM | POA: Diagnosis not present

## 2016-11-16 DIAGNOSIS — R440 Auditory hallucinations: Secondary | ICD-10-CM | POA: Diagnosis not present

## 2016-11-16 DIAGNOSIS — B9620 Unspecified Escherichia coli [E. coli] as the cause of diseases classified elsewhere: Secondary | ICD-10-CM | POA: Diagnosis not present

## 2016-11-16 DIAGNOSIS — E876 Hypokalemia: Secondary | ICD-10-CM | POA: Diagnosis not present

## 2016-11-16 DIAGNOSIS — E1122 Type 2 diabetes mellitus with diabetic chronic kidney disease: Secondary | ICD-10-CM | POA: Diagnosis present

## 2016-11-16 DIAGNOSIS — M1990 Unspecified osteoarthritis, unspecified site: Secondary | ICD-10-CM | POA: Diagnosis present

## 2016-11-16 DIAGNOSIS — I951 Orthostatic hypotension: Secondary | ICD-10-CM | POA: Diagnosis present

## 2016-11-16 DIAGNOSIS — N289 Disorder of kidney and ureter, unspecified: Secondary | ICD-10-CM | POA: Diagnosis not present

## 2016-11-16 DIAGNOSIS — R55 Syncope and collapse: Secondary | ICD-10-CM | POA: Diagnosis not present

## 2016-11-16 DIAGNOSIS — R410 Disorientation, unspecified: Secondary | ICD-10-CM | POA: Diagnosis not present

## 2016-11-16 DIAGNOSIS — K219 Gastro-esophageal reflux disease without esophagitis: Secondary | ICD-10-CM | POA: Diagnosis present

## 2016-11-16 DIAGNOSIS — F05 Delirium due to known physiological condition: Secondary | ICD-10-CM | POA: Diagnosis not present

## 2016-11-16 DIAGNOSIS — N183 Chronic kidney disease, stage 3 (moderate): Secondary | ICD-10-CM | POA: Diagnosis present

## 2016-11-16 DIAGNOSIS — A4151 Sepsis due to Escherichia coli [E. coli]: Secondary | ICD-10-CM | POA: Diagnosis present

## 2016-11-16 DIAGNOSIS — N179 Acute kidney failure, unspecified: Secondary | ICD-10-CM | POA: Diagnosis present

## 2016-11-16 DIAGNOSIS — F0391 Unspecified dementia with behavioral disturbance: Secondary | ICD-10-CM | POA: Diagnosis not present

## 2016-11-16 DIAGNOSIS — E119 Type 2 diabetes mellitus without complications: Secondary | ICD-10-CM | POA: Diagnosis not present

## 2016-11-16 DIAGNOSIS — I69351 Hemiplegia and hemiparesis following cerebral infarction affecting right dominant side: Secondary | ICD-10-CM | POA: Diagnosis not present

## 2016-11-16 DIAGNOSIS — R000 Tachycardia, unspecified: Secondary | ICD-10-CM | POA: Diagnosis present

## 2016-11-16 LAB — BLOOD CULTURE ID PANEL (REFLEXED)
Acinetobacter baumannii: NOT DETECTED
CANDIDA KRUSEI: NOT DETECTED
CANDIDA PARAPSILOSIS: NOT DETECTED
CARBAPENEM RESISTANCE: NOT DETECTED
Candida albicans: NOT DETECTED
Candida glabrata: NOT DETECTED
Candida tropicalis: NOT DETECTED
ENTEROCOCCUS SPECIES: NOT DETECTED
Enterobacter cloacae complex: NOT DETECTED
Enterobacteriaceae species: DETECTED — AB
Escherichia coli: DETECTED — AB
Haemophilus influenzae: NOT DETECTED
KLEBSIELLA OXYTOCA: NOT DETECTED
Klebsiella pneumoniae: NOT DETECTED
LISTERIA MONOCYTOGENES: NOT DETECTED
Neisseria meningitidis: NOT DETECTED
Proteus species: NOT DETECTED
Pseudomonas aeruginosa: NOT DETECTED
SERRATIA MARCESCENS: NOT DETECTED
STAPHYLOCOCCUS SPECIES: NOT DETECTED
STREPTOCOCCUS PNEUMONIAE: NOT DETECTED
Staphylococcus aureus (BCID): NOT DETECTED
Streptococcus agalactiae: NOT DETECTED
Streptococcus pyogenes: NOT DETECTED
Streptococcus species: NOT DETECTED

## 2016-11-16 LAB — COMPREHENSIVE METABOLIC PANEL
ALK PHOS: 114 U/L (ref 38–126)
ALT: 13 U/L — ABNORMAL LOW (ref 14–54)
ANION GAP: 11 (ref 5–15)
AST: 22 U/L (ref 15–41)
Albumin: 2.6 g/dL — ABNORMAL LOW (ref 3.5–5.0)
BILIRUBIN TOTAL: 1.1 mg/dL (ref 0.3–1.2)
BUN: 38 mg/dL — ABNORMAL HIGH (ref 6–20)
CALCIUM: 8.7 mg/dL — AB (ref 8.9–10.3)
CO2: 28 mmol/L (ref 22–32)
Chloride: 101 mmol/L (ref 101–111)
Creatinine, Ser: 1.84 mg/dL — ABNORMAL HIGH (ref 0.44–1.00)
GFR calc non Af Amer: 26 mL/min — ABNORMAL LOW (ref >60)
GFR, EST AFRICAN AMERICAN: 30 mL/min — AB (ref >60)
Glucose, Bld: 349 mg/dL — ABNORMAL HIGH (ref 65–99)
POTASSIUM: 4.1 mmol/L (ref 3.5–5.1)
SODIUM: 140 mmol/L (ref 135–145)
Total Protein: 6.5 g/dL (ref 6.5–8.1)

## 2016-11-16 LAB — CBC
HCT: 37.4 % (ref 36.0–46.0)
HEMOGLOBIN: 11.8 g/dL — AB (ref 12.0–15.0)
MCH: 31.5 pg (ref 26.0–34.0)
MCHC: 31.6 g/dL (ref 30.0–36.0)
MCV: 99.7 fL (ref 78.0–100.0)
Platelets: 218 10*3/uL (ref 150–400)
RBC: 3.75 MIL/uL — AB (ref 3.87–5.11)
RDW: 13.4 % (ref 11.5–15.5)
WBC: 19.7 10*3/uL — ABNORMAL HIGH (ref 4.0–10.5)

## 2016-11-16 LAB — TROPONIN I
Troponin I: <0.03 ng/mL (ref <0.03)
Troponin I: 0.03 ng/mL (ref <0.03)

## 2016-11-16 LAB — ECHOCARDIOGRAM COMPLETE
HEIGHTINCHES: 65 in
WEIGHTICAEL: 2486.4 [oz_av]

## 2016-11-16 LAB — MAGNESIUM: MAGNESIUM: 1.7 mg/dL (ref 1.7–2.4)

## 2016-11-16 LAB — GLUCOSE, CAPILLARY: Glucose-Capillary: 304 mg/dL — ABNORMAL HIGH (ref 65–99)

## 2016-11-16 LAB — PHOSPHORUS: PHOSPHORUS: 3.9 mg/dL (ref 2.5–4.6)

## 2016-11-16 MED ORDER — CEPHALEXIN 500 MG PO CAPS: 500.0000 mg | ORAL_CAPSULE | Freq: Two times a day (BID) | ORAL | 0 refills | Status: DC

## 2016-11-16 MED ORDER — DEXTROSE 5 % IV SOLN
2.0000 g | INTRAVENOUS | Status: DC
  Administered 2016-11-16 – 2016-11-17 (×2): 2 g via INTRAVENOUS
  Filled 2016-11-16 (×3): qty 2

## 2016-11-16 MED ORDER — DIVALPROEX SODIUM ER 250 MG PO TB24: 750.0000 mg | ORAL_TABLET | Freq: Every day | ORAL | 0 refills | Status: DC

## 2016-11-16 NOTE — Progress Notes (Signed)
PHARMACY - PHYSICIAN COMMUNICATION CRITICAL VALUE ALERT - BLOOD CULTURE IDENTIFICATION (BCID)  Results for orders placed or performed during the hospital encounter of 11/15/16  Blood Culture ID Panel (Reflexed) (Collected: 11/15/2016  7:37 PM)  Result Value Ref Range   Enterococcus species NOT DETECTED NOT DETECTED   Listeria monocytogenes NOT DETECTED NOT DETECTED   Staphylococcus species NOT DETECTED NOT DETECTED   Staphylococcus aureus NOT DETECTED NOT DETECTED   Streptococcus species NOT DETECTED NOT DETECTED   Streptococcus agalactiae NOT DETECTED NOT DETECTED   Streptococcus pneumoniae NOT DETECTED NOT DETECTED   Streptococcus pyogenes NOT DETECTED NOT DETECTED   Acinetobacter baumannii NOT DETECTED NOT DETECTED   Enterobacteriaceae species PENDING NOT DETECTED   Enterobacter cloacae complex NOT DETECTED NOT DETECTED   Escherichia coli DETECTED (A) NOT DETECTED   Klebsiella oxytoca NOT DETECTED NOT DETECTED   Klebsiella pneumoniae NOT DETECTED NOT DETECTED   Proteus species NOT DETECTED NOT DETECTED   Serratia marcescens NOT DETECTED NOT DETECTED   Carbapenem resistance NOT DETECTED NOT DETECTED   Haemophilus influenzae NOT DETECTED NOT DETECTED   Neisseria meningitidis NOT DETECTED NOT DETECTED   Pseudomonas aeruginosa NOT DETECTED NOT DETECTED   Candida albicans NOT DETECTED NOT DETECTED   Candida glabrata NOT DETECTED NOT DETECTED   Candida krusei NOT DETECTED NOT DETECTED   Candida parapsilosis NOT DETECTED NOT DETECTED   Candida tropicalis NOT DETECTED NOT DETECTED    Name of physician (or Provider) Contacted: Marjory Sneddon, MD  Changes to prescribed antibiotics required: Patient currently on ceftriaxone 2g IV q24h. Continue current regimen.  Dimitri Ped, PharmD, BCPS PGY-2 Infectious Diseases Pharmacy Resident Pager: [phone] 11/16/2016  1:46 PM

## 2016-11-16 NOTE — Progress Notes (Signed)
  Echocardiogram 2D Echocardiogram has been performed.  [name] [name] 11/16/2016, 11:14 AM

## 2016-11-16 NOTE — Progress Notes (Signed)
Per nursing patient had another similar episode while she was using the bathroom assisted by staff. At this time patient was tachycardic and per nursing was unresponsive for a brief moment. Patient quickly became responsive. We assessed the patient and she stated this was similar to her previous episode. She was denying chest pain or shortness of breath. She was alert and oriented 3. Her vital signs were stable. She was minimally tachycardic.  Given that this occurrence also happened around the time the patient was trying to use the restroom I am suspicious that her symptoms may be secondary to post micturition syncope. She has had similar episodes in the past.

## 2016-11-16 NOTE — Progress Notes (Addendum)
   Subjective: [name] [name] was seen and evaluated today at bedside. [name] [name] present. [name] [name] reports she is feeling better than yesterday however still complains of some weakness. Reports resolution of suprapubic discomfort. [name] [name] remembers being here at [facility] when his wife had prior episodes of similar events while using the rest-room.   Objective:  Vital signs in last 24 hours: Vitals:   11/16/16 0509 11/16/16 0512 11/16/16 0751 11/16/16 0931  BP: 118/70 (!) 125/99  (!) 101/44  Pulse: 94 (!) 102  99  Resp:    20  Temp:   (!) 102.3 F (39.1 C) 98.9 F (37.2 C)  TempSrc:   Oral Oral  SpO2: 97% 95%  95%  Weight:      Height:       General: Very pleasant chronically ill appearing woman resting comfortably in bed. [name] [name] at bedside. In no acute distress.  Hard of hearing. HENT: No conjunctival injection, icterus or ptosis. Cataracts Cardiovascular: Regular rate and rhythm. No murmur or rub appreciated. Pulmonary: CTA BL, no wheezing. Good air flow. Unlabored breathing.  Abdomen: Soft, non-tender and non-distended. +bowel sounds Skin: Warm, dry. No cyanosis. Color improved.  Psych: Mood normal and affect was mood congruent. Responds to questions appropriately.   Assessment/Plan:  Principal Problem:   Syncope Active Problems:   DM type 2 (diabetes mellitus, type 2) (HCC)   CKD (chronic kidney disease) stage 3, GFR 30-59 ml/min   Seizure disorder, grand mal ([place])   Aortic atherosclerosis (HCC)   UTI (urinary tract infection)  Situational Syncope, Seizure Disorder Orthostats negative. Presentation not consistent with seizure activity and more consistent with her established diagnosis of micturition-syncope. Pt with intermittent syncopal episodes without memory during urination, dating back since 2014 from chart review. Her valproic acid level was subtherapeutic on admission at 28 however presentation not consistent with seizure activity. Denied confusion, loss of bladder  control or tongue biting. Neurology has evaluated the patient who is loading the patient with IV Valproic acid 750 mg to achieve level close to 80, followed by increased maintenance dose at PO 750mg  daily.  -Valproate 750 mg daily -Awaiting PT/OT recs for d/c -Counseled the patient and her [name] [name] on management of situational syncope and lifestyle changes to help prevent further events -Read of ECHO pending  Urinary Tract Infection, E. Coli Bacteremia UA suggestive of infection and complained of dysuria, weakness and suprapubic pain. Was given IV rocephin upon admission and reports improvement of her symptoms. Blood cultures today returned growing E.coli. Abx have been adjusted to appropriate dose for bacteremia. Sensitivities pending and will narrow.  -IV Rocephin 2g, per pharmacy  History of Hemorrhagic CVA with residual deficits  At baseline. Continue ASA and Lipitor  Dispo: Anticipated discharge in 1-2 days.   Jovonda Selner, DO 11/16/2016, 10:33 AM Pager: [phone]

## 2016-11-16 NOTE — Progress Notes (Signed)
11/15/16 2256  What Happened  Was fall witnessed? Yes  Who witnessed fall? Lav[place] and Toyin  Patients activity before fall bathroom-assisted  Point of contact buttocks;other (comment) (eased to the floor to get help)  Was patient injured? No  Follow Up  MD notified HuGELMEYER & TAYLOR  Time MD notified [phone]  Family notified Yes-comment (Both contacts on file)  Time family notified 0200  Additional tests No  Progress note created (see row info) Yes  Adult Fall Risk Assessment  Risk Factor Category (scoring not indicated) High fall risk per protocol (document High fall risk)  Patient's Fall Risk High Fall Risk (>13 points)  Adult Fall Risk Interventions  Required Bundle Interventions *See Row Information* High fall risk - low, moderate, and high requirements implemented  Additional Interventions Individualized elimination schedule;Room near nurses station;Use of appropriate toileting equipment (bedpan, BSC, etc.)  Screening for Fall Injury Risk  Risk For Fall Injury- See Row Information  F;Nurse judgement  Vitals  BP (!) 158/68  MAP (mmHg) 89  BP Location Left Arm  BP Method Automatic  Patient Position (if appropriate) Sitting  Pulse Rate (!) 129  Pulse Rate Source Monitor  Cardiac Rhythm ST  Resp 17  Oxygen Therapy  SpO2 93 %  O2 Device Room Air  Pain Assessment  Pain Assessment No/denies pain  Pain Score 0  Neurological  Neuro (WDL) X (Hx of seizure)  Level of Consciousness Alert  Orientation Level Oriented X4  Cognition Appropriate at baseline  Speech Clear  Pupil Assessment  Yes  R Pupil Size (mm) 2  R Pupil Shape Round  R Pupil Reaction Brisk  L Pupil Size (mm) 2  L Pupil Shape Round  L Pupil Reaction Brisk  Additional Pupil Assessments No  Motor Function/Sensation Assessment Grip;Plantar flexion;Dorsiflexion;Motor strength;Pronator drift  Facial Symmetry Symmetrical  R Hand Grip Moderate  L Hand Grip Moderate   Right Pronator Drift Absent  Left  Pronator Drift Absent  R Foot Dorsiflexion Moderate  L Foot Dorsiflexion Moderate  R Foot Plantar Flexion Moderate  L Foot Plantar Flexion Moderate  RUE Motor Strength 5  LUE Motor Strength 5  RLE Motor Strength 5  LLE Motor Strength 5  Neuro Symptoms None  Neuro Additional Assessments No  Neuro Additional Assessments No  Glasgow Coma Scale  Eye Opening 4  Best Verbal Response (NON-intubated) 5  Best Motor Response 6  Glasgow Coma Scale Score 15  Musculoskeletal  Musculoskeletal (WDL) X  Assistive Device BSC  Generalized Weakness Yes  Weight Bearing Restrictions No  Integumentary  Integumentary (WDL) WDL

## 2016-11-16 NOTE — Evaluation (Signed)
Physical Therapy Evaluation Patient Details Name: [name] [name] MRN: [id number] DOB: [birth year] Today's Date: 11/16/2016   History of Present Illness  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   Clinical Impression   Pt admitted with above diagnosis. Pt currently with functional limitations due to the deficits listed below (see PT Problem List).   Presents with significantly increased fall risk as she shows signs of pre-syncope consistently after approx 3 minutes of standing; Unable to obtain a 3 minute standing BP, as she showed potential signs of passing out -- finished BP sitting, it was 105/55, HR 117 -- very possible it was lower when she was standing and reported the need to sit;   We discussed fall risk, safety, and self-monitoring for activity tolerance; Clarene Critchley, RN present as well, and answered husband's questions re: BP meds; Noted Neuro Consult, and that Parkinsonian Orthostasis is in the differential dx; She does present somewhat consistently with a CNS/Autonomic component to her orthostatic pathology; May be worth considering TED hose;   Pt will benefit from skilled PT to increase their independence and safety with mobility to allow discharge to the venue listed below.   Orthostatics were as follows:   11/16/16 1124 11/16/16 1125  Vital Signs  Patient Position (if appropriate) Orthostatic Vitals --   Orthostatic Lying   BP- Lying (!) 125/39 --   Pulse- Lying 98 --   Orthostatic Sitting  BP- Sitting 105/75 105/55  Pulse- Sitting 108 117 (After attempt at standing BP, but sympotmatic for dizziness, and requested to sit)  Orthostatic Standing at 0 minutes  BP- Standing at  0 minutes (!) 123/94 --   Pulse- Standing at 0 minutes 115 --   Orthostatic Standing at 3 minutes  BP- Standing at 3 minutes (Unable to obtain due to dizziness) --          Follow Up Recommendations Home health PT;Supervision/Assistance - 24 hour (May benefit from [facility]; does pt qualify?)    Equipment Recommendations  Wheelchair (measurements PT); bedside commode   Recommendations for Other Services OT consult     Precautions / Restrictions Precautions Precautions: Fall Precaution Comments: Fall/syncope risk is greatest after spending a few minutes standing      Mobility  Bed Mobility Overal bed mobility: Needs Assistance Bed Mobility: Supine to Sit     Supine to sit: Min guard     General bed mobility comments: [place] for safety  Transfers Overall transfer level: Needs assistance Equipment used: 1 person hand held assist Transfers: Sit to/from Stand Sit to Stand: Min guard         General transfer comment: [place]ard assist to steady; cues to acheive fully upright posture  Ambulation/Gait Ambulation/Gait assistance: Min assist Ambulation Distance (Feet): 30 Feet (with seated rest break) Assistive device: 1 person hand held assist Gait Pattern/deviations: Step-through pattern;Trunk flexed     General Gait Details: Cues to self-monitor for activity tolerance  Stairs            Wheelchair Mobility    Modified Rankin (Stroke Patients Only)       Balance  Pertinent Vitals/Pain Pain Assessment: No/denies pain    Home Living Family/patient expects to be discharged to:: Private residence Living Arrangements: Spouse/significant other Available Help at Discharge: Family;Available 24 hours/day Type of Home: House Home Access: Stairs to enter Entrance Stairs-Rails: None Entrance Stairs-Number of Steps: 1 Home Layout: One level Home Equipment: Environmental consultant - 2 wheels      Prior Function  Level of Independence: Needs assistance   Gait / Transfers Assistance Needed: Has a RW, but doesn't use it  ADL's / Homemaking Assistance Needed: Assist for ADLs        Hand Dominance   Dominant Hand: Right    Extremity/Trunk Assessment   Upper Extremity Assessment Upper Extremity Assessment: Generalized weakness    Lower Extremity Assessment Lower Extremity Assessment: Generalized weakness    Cervical / Trunk Assessment Cervical / Trunk Assessment: Kyphotic  Communication   Communication: HOH  Cognition Arousal/Alertness: Awake/alert Behavior During Therapy: WFL for tasks assessed/performed Overall Cognitive Status: Within Functional Limits for tasks assessed                      General Comments      Exercises     Assessment/Plan    PT Assessment Patient needs continued PT services  PT Problem List Decreased strength;Decreased activity tolerance;Decreased balance;Decreased mobility;Decreased knowledge of precautions;Cardiopulmonary status limiting activity;Decreased coordination;Decreased cognition;Decreased knowledge of use of DME;Decreased safety awareness          PT Treatment Interventions DME instruction;Gait training;Stair training;Functional mobility training;Therapeutic activities;Therapeutic exercise;Patient/family education    PT Goals (Current goals can be found in the Care Plan section)  Acute Rehab PT Goals Patient Stated Goal: Did not state PT Goal Formulation: With patient/family Time For Goal Achievement: 11/30/16 Potential to Achieve Goals: Good    Frequency Min 3X/week   Barriers to discharge Other (comment) Concern for likelihood of syncope/passing out if greater than 2 minutes standing    Co-evaluation               End of Session Equipment Utilized During Treatment: Gait belt Activity Tolerance: Other (comment) (Limited by near-syncope) Patient left: in chair;with call bell/phone within reach;with family/visitor  present;Other (comment) (almost upright in recliner, feet up) Nurse Communication: Mobility status (Serial BPs)    Functional Assessment Tool Used: Clinical Judgement Functional Limitation: Mobility: Walking and moving around Mobility: Walking and Moving Around Current Status [id number]): At least 20 percent but less than 40 percent impaired, limited or restricted Mobility: Walking and Moving Around Goal Status [phone]): 0 percent impaired, limited or restricted    Time: 1117-1150 PT Time Calculation (min) (ACUTE ONLY): 33 min   Charges:   PT Evaluation $PT Eval Moderate Complexity: 1 Procedure PT Treatments $Gait Training: 8-22 mins   PT G Codes:   PT G-Codes **NOT FOR INPATIENT CLASS** Functional Assessment Tool Used: Clinical Judgement Functional Limitation: Mobility: Walking and moving around Mobility: Walking and Moving Around Current Status [id number]): At least 20 percent but less than 40 percent impaired, limited or restricted Mobility: Walking and Moving Around Goal Status [phone]): 0 percent impaired, limited or restricted    Colletta Maryland 11/16/2016, 1:18 PM  Roney Marion, [place] Pager [phone] Office [phone]

## 2016-11-17 ENCOUNTER — Ambulatory Visit (HOSPITAL_COMMUNITY): Payer: Federal, State, Local not specified - PPO

## 2016-11-17 ENCOUNTER — Inpatient Hospital Stay (HOSPITAL_COMMUNITY): Payer: Medicare Other

## 2016-11-17 ENCOUNTER — Other Ambulatory Visit (HOSPITAL_COMMUNITY): Payer: Federal, State, Local not specified - PPO

## 2016-11-17 ENCOUNTER — Encounter (HOSPITAL_COMMUNITY): Payer: Self-pay | Admitting: General Practice

## 2016-11-17 DIAGNOSIS — F0391 Unspecified dementia with behavioral disturbance: Secondary | ICD-10-CM

## 2016-11-17 DIAGNOSIS — F05 Delirium due to known physiological condition: Secondary | ICD-10-CM

## 2016-11-17 DIAGNOSIS — E1165 Type 2 diabetes mellitus with hyperglycemia: Secondary | ICD-10-CM

## 2016-11-17 LAB — BASIC METABOLIC PANEL
ANION GAP: 19 — AB (ref 5–15)
Anion gap: 16 — ABNORMAL HIGH (ref 5–15)
Anion gap: 15 (ref 5–15)
Anion gap: 12 (ref 5–15)
Anion gap: 13 (ref 5–15)
BUN: 52 mg/dL — ABNORMAL HIGH (ref 6–20)
BUN: 50 mg/dL — ABNORMAL HIGH (ref 6–20)
BUN: 48 mg/dL — ABNORMAL HIGH (ref 6–20)
BUN: 46 mg/dL — AB (ref 6–20)
BUN: 40 mg/dL — AB (ref 6–20)
CALCIUM: 9.1 mg/dL (ref 8.9–10.3)
CHLORIDE: 97 mmol/L — AB (ref 101–111)
CHLORIDE: 106 mmol/L (ref 101–111)
CHLORIDE: 104 mmol/L (ref 101–111)
CO2: 24 mmol/L (ref 22–32)
CO2: 25 mmol/L (ref 22–32)
CO2: 26 mmol/L (ref 22–32)
CO2: 23 mmol/L (ref 22–32)
CO2: 24 mmol/L (ref 22–32)
CREATININE: 2.32 mg/dL — AB (ref 0.44–1.00)
CREATININE: 2.26 mg/dL — AB (ref 0.44–1.00)
CREATININE: 1.99 mg/dL — AB (ref 0.44–1.00)
CREATININE: 2.02 mg/dL — AB (ref 0.44–1.00)
Calcium: 9 mg/dL (ref 8.9–10.3)
Calcium: 8.9 mg/dL (ref 8.9–10.3)
Calcium: 8.2 mg/dL — ABNORMAL LOW (ref 8.9–10.3)
Calcium: 8.1 mg/dL — ABNORMAL LOW (ref 8.9–10.3)
Chloride: 93 mmol/L — ABNORMAL LOW (ref 101–111)
Chloride: 96 mmol/L — ABNORMAL LOW (ref 101–111)
Creatinine, Ser: 2.46 mg/dL — ABNORMAL HIGH (ref 0.44–1.00)
GFR calc Af Amer: 23 mL/min — ABNORMAL LOW (ref >60)
GFR calc Af Amer: 23 mL/min — ABNORMAL LOW (ref >60)
GFR calc Af Amer: 27 mL/min — ABNORMAL LOW (ref >60)
GFR calc Af Amer: 27 mL/min — ABNORMAL LOW (ref >60)
GFR calc non Af Amer: 23 mL/min — ABNORMAL LOW (ref >60)
GFR calc non Af Amer: 23 mL/min — ABNORMAL LOW (ref >60)
GFR, EST AFRICAN AMERICAN: 21 mL/min — AB (ref >60)
GFR, EST NON AFRICAN AMERICAN: 18 mL/min — AB (ref >60)
GFR, EST NON AFRICAN AMERICAN: 19 mL/min — AB (ref >60)
GFR, EST NON AFRICAN AMERICAN: 20 mL/min — AB (ref >60)
GLUCOSE: 489 mg/dL — AB (ref 65–99)
GLUCOSE: 160 mg/dL — AB (ref 65–99)
GLUCOSE: 119 mg/dL — AB (ref 65–99)
Glucose, Bld: 455 mg/dL — ABNORMAL HIGH (ref 65–99)
Glucose, Bld: 309 mg/dL — ABNORMAL HIGH (ref 65–99)
POTASSIUM: 4.5 mmol/L (ref 3.5–5.1)
Potassium: 4.2 mmol/L (ref 3.5–5.1)
Potassium: 3.7 mmol/L (ref 3.5–5.1)
Potassium: 4.3 mmol/L (ref 3.5–5.1)
Potassium: 3.9 mmol/L (ref 3.5–5.1)
SODIUM: 137 mmol/L (ref 135–145)
SODIUM: 138 mmol/L (ref 135–145)
SODIUM: 141 mmol/L (ref 135–145)
SODIUM: 141 mmol/L (ref 135–145)
Sodium: 136 mmol/L (ref 135–145)

## 2016-11-17 LAB — GLUCOSE, CAPILLARY
GLUCOSE-CAPILLARY: 464 mg/dL — AB (ref 65–99)
GLUCOSE-CAPILLARY: 308 mg/dL — AB (ref 65–99)
GLUCOSE-CAPILLARY: 84 mg/dL (ref 65–99)
Glucose-Capillary: 378 mg/dL — ABNORMAL HIGH (ref 65–99)
Glucose-Capillary: 289 mg/dL — ABNORMAL HIGH (ref 65–99)

## 2016-11-17 LAB — CBC
HEMATOCRIT: 38.6 % (ref 36.0–46.0)
Hemoglobin: 12.3 g/dL (ref 12.0–15.0)
MCH: 31.6 pg (ref 26.0–34.0)
MCHC: 31.9 g/dL (ref 30.0–36.0)
MCV: 99.2 fL (ref 78.0–100.0)
Platelets: 208 10*3/uL (ref 150–400)
RBC: 3.89 MIL/uL (ref 3.87–5.11)
RDW: 13.4 % (ref 11.5–15.5)
WBC: 20.3 10*3/uL — AB (ref 4.0–10.5)

## 2016-11-17 MED ORDER — SODIUM CHLORIDE 0.9 % IV SOLN: INTRAVENOUS | Status: DC

## 2016-11-17 MED ORDER — DEXTROSE-NACL 5-0.45 % IV SOLN: INTRAVENOUS | Status: DC

## 2016-11-17 MED ORDER — SODIUM CHLORIDE 0.9 % IV BOLUS (SEPSIS)
500.0000 mL | Freq: Once | INTRAVENOUS | Status: AC
  Administered 2016-11-17: 500 mL via INTRAVENOUS

## 2016-11-17 MED ORDER — SODIUM CHLORIDE 0.9 % IV SOLN
INTRAVENOUS | Status: DC
  Administered 2016-11-17 – 2016-11-18 (×3): via INTRAVENOUS

## 2016-11-17 MED ORDER — ACETAMINOPHEN 325 MG PO TABS
650.0000 mg | ORAL_TABLET | Freq: Once | ORAL | Status: AC
  Administered 2016-11-17: 650 mg via ORAL
  Filled 2016-11-17: qty 2

## 2016-11-17 MED ORDER — SODIUM CHLORIDE 0.9 % IV BOLUS (SEPSIS)
500.0000 mL | Freq: Once | INTRAVENOUS | Status: AC
Start: 2016-11-17 — End: 2016-11-17
  Administered 2016-11-17: 500 mL via INTRAVENOUS

## 2016-11-17 MED ORDER — SODIUM CHLORIDE 0.9 % IV SOLN
INTRAVENOUS | Status: DC
  Administered 2016-11-17: 4.3 [IU]/h via INTRAVENOUS
  Filled 2016-11-17: qty 2.5

## 2016-11-17 MED ORDER — INSULIN DETEMIR 100 UNIT/ML ~~LOC~~ SOLN
10.0000 [IU] | Freq: Once | SUBCUTANEOUS | Status: AC
  Administered 2016-11-17: 10 [IU] via SUBCUTANEOUS
  Filled 2016-11-17: qty 0.1

## 2016-11-17 MED ORDER — INSULIN DETEMIR 100 UNIT/ML ~~LOC~~ SOLN: 10.0000 [IU] | Freq: Every day | SUBCUTANEOUS | Status: DC

## 2016-11-17 MED ORDER — INSULIN ASPART 100 UNIT/ML ~~LOC~~ SOLN
0.0000 [IU] | Freq: Every day | SUBCUTANEOUS | Status: DC
  Administered 2016-11-20: 2 [IU] via SUBCUTANEOUS

## 2016-11-17 MED ORDER — SODIUM CHLORIDE 0.9 % IV BOLUS (SEPSIS)
1000.0000 mL | Freq: Once | INTRAVENOUS | Status: AC
  Administered 2016-11-17: 1000 mL via INTRAVENOUS

## 2016-11-17 MED ORDER — INSULIN DETEMIR 100 UNIT/ML ~~LOC~~ SOLN
10.0000 [IU] | Freq: Every day | SUBCUTANEOUS | Status: DC
  Administered 2016-11-18 – 2016-11-20 (×3): 10 [IU] via SUBCUTANEOUS
  Filled 2016-11-17 (×6): qty 0.1

## 2016-11-17 MED ORDER — INSULIN ASPART 100 UNIT/ML ~~LOC~~ SOLN
0.0000 [IU] | Freq: Three times a day (TID) | SUBCUTANEOUS | Status: DC
  Administered 2016-11-17 (×2): 5 [IU] via SUBCUTANEOUS
  Administered 2016-11-18: 3 [IU] via SUBCUTANEOUS
  Administered 2016-11-19 (×2): 1 [IU] via SUBCUTANEOUS
  Administered 2016-11-19: 3 [IU] via SUBCUTANEOUS
  Administered 2016-11-20: 1 [IU] via SUBCUTANEOUS
  Administered 2016-11-20 (×2): 2 [IU] via SUBCUTANEOUS
  Administered 2016-11-21: 3 [IU] via SUBCUTANEOUS

## 2016-11-17 MED ORDER — SODIUM CHLORIDE 0.9 % IV SOLN
INTRAVENOUS | Status: AC
  Administered 2016-11-17: 11:00:00 via INTRAVENOUS

## 2016-11-17 NOTE — Progress Notes (Signed)
   Subjective: [name] [name] was seen and evaluated today at bedside. She reports she still feels quite sick and had a hard time sleeping overnight. Reports feeling chilled and febrile.  Objective:  Vital signs in last 24 hours: Vitals:   11/16/16 2227 11/16/16 2330 11/17/16 0500 11/17/16 0638  BP: 120/63   (!) 157/54  Pulse: (!) 101   (!) 105  Resp: 18   18  Temp: (!) 101.9 F (38.8 C) 98.8 F (37.1 C)  (!) 100.5 F (38.1 C)  TempSrc: Oral Oral    SpO2: 95%   91%  Weight:   147 lb 1.6 oz (66.7 kg)   Height:       General: Chronically-ill appearing caucasian woman. Restless in bed. Trying to get up to go to the bathroom without assistance. Confused. HENT: [place]. No conjunctival injection, icterus or ptosis.  Cardiovascular: Tachycardic, otherwise regular. No murmur or rub appreciated. Pulmonary: CTA BL, no wheezing on anterior chest. Unlabored breathing. Abdomen: Soft, mild diffuse tenderness to palpation. Patient holding lower stomach on examination. +Bowel sounds. Skin: hot, dry. Pale.  Neuro: Alert but confused throughout exam, stating "I need to go to the doctors."  Assessment/Plan:  Principal Problem:   Syncope Active Problems:   DM type 2 (diabetes mellitus, type 2) (HCC)   CKD (chronic kidney disease) stage 3, GFR 30-59 ml/min   Seizure disorder, grand mal ([place])   Aortic atherosclerosis ([place])   UTI (urinary tract infection)  E.Coli Bacteremia, UTI Blood culture yesterday returned growing E.Coli and Ceftriaxone was increased to 2 g accordingly. Patient has continued to remain febrile, tachycardic and has developed worsening confusion overnight. Review of chart shows that the patient does not frequently get UTIs nor does she have history of ESBL. Cr increased this morning to 2.46 from 1.8, unknown baseline however was normal 3 years ago. Leukocytosis at 20,000. -IV Ceftriaxone 2 g -Fluid replacement with 1L ns bolus + maintenance fluids  -Tylenol for fever, encourage  nursing staff to admin with recorded fevers.  Type 2 Diabetes, On home Insulin Glucose today 489, corrected sodium 142, making anion gap of 25. Bicarb normal, not acidotic. Pt is on Levemir 10 units QHS + SSI at home however not on this regimen here. Will restart this regimen and monitor response -BMET Q4H -CBG QID -Levemir 10 units NOW then 10 units QHS -Sensitive sliding scale  Situational Syncope, Seizure Disorder Seen by neuro who increased her Valproic acid to 750 mg daily. Do not believe her presentation is consistent with seizure. More likely situational syncope, with a lower threshold due to above infectious process.  -Valproic acid PO 750 mg daily  Dispo: Anticipated discharge in approximately 2-3 day(s).   [name] Karnes, DO 11/17/2016, 7:20 AM Pager: [phone]

## 2016-11-17 NOTE — Progress Notes (Signed)
Paged by RN saying that pt had a reproted syncopal episode. Discussed with occupational therapist, RN, and husband at bedside, that OT was trying to walk the pt, and pt felt very weak, and sat down abruptly on the commode that was nearby. She was incontinent. There were no tonic clonic movements, or tongue biting to suggest a neurologic process going on  Pt denies any headache, chest pain, dyspnea, nausea, vomiting. Denies any confusion.   BP (!) 157/54 (BP Location: Right Arm)   Pulse (!) 105   Temp (!) 100.5 F (38.1 C)   Resp 18   Ht 5\' 5"  (1.651 m)   Wt 147 lb 1.6 oz (66.7 kg)   SpO2 91%   BMI 24.48 kg/m   Gen: Pt was lethargic, lying in bed, answering questions appropriately. CV: RRR, no m/r/g Neurologic exam: Strength 5/5 and intact in all UE and LE   A: She does not seem to have a seizure, and per my exam, neurologically she is intact, but she is very weak.   Plan -We will continue to hydrate her with IV fluids -She should not undergo PT evals currently

## 2016-11-17 NOTE — Progress Notes (Signed)
Patient working with PT and began to have a seizure lasted about a minute and 15 seconds. Dr. Darlyn Chamber will continue to monitor.

## 2016-11-17 NOTE — Progress Notes (Signed)
Internal Medicine Attending:   I saw and examined the patient. I reviewed the resident's note and I agree with the resident's findings and plan as documented in the resident's note.  76 year old woman was admitted yesterday for evaluation of syncope and found to have urinary tract infection complicated by Escherichia coli bacteremia. She has been febrile over the last 1 day. Her mental status is worse today than yesterday, she is now having some signs of delirium with auditory hallucinations. Renal function is also worse from a creatinine of 1.8 yesterday to 2.4 today. I think that she has developed sepsis due to the Escherichia coli urinary tract infection. Blood pressures have been appropriate. She has received 1 L of IV normal saline and is now on maintenance fluids. She is receiving ceftriaxone. She has no history of or risk factors for an ESBL producing strain, so I think it's fine to continue this antibiotic. We should have sensitivities on the Escherichia coli by tomorrow.

## 2016-11-17 NOTE — Evaluation (Signed)
Occupational Therapy Evaluation Patient Details Name: [name] [name] MRN: [id number] DOB: November 19, 1940 Today's Date: 11/17/2016    History of Present Illness 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    Clinical Impression   Per husband, pt able to bathe and dress self but required assist for tub transfers and household management PTA. Currently pt min assist for functional mobility and ADL. While assisting pt to chair, pt with syncopal episode and period of unresponsiveness lasting ~90 seconds with incontinence of bladder. Pt without c/o dizziness prior to episode. Pt assisted to sitting on BSC during unresponsive period; RN and RN tech x2 assisting with transfer back to bed once pt alert. At this time, recommending SNF for follow up due to frequency of syncopal episodes (2 in past 3 days) and pt being asymptomatic prior to episodes occurring. Pending medical improvement; pt may be able to return home with [facility] follow up (HHPT/OT/aide). Pt would benefit from continued skilled OT to address established goals.    Follow Up Recommendations  SNF;Supervision/Assistance - 24 hour    Equipment Recommendations  3 in 1 bedside commode;Tub/shower seat    Recommendations for Other Services       Precautions / Restrictions Precautions Precautions: Fall Precaution Comments: hx of syncopal episode Restrictions Weight Bearing Restrictions: No      Mobility Bed Mobility Overal bed mobility: Needs Assistance Bed Mobility: Supine to Sit;Sit to Supine     Supine to sit: Min assist;HOB elevated Sit to supine: Total assist   General bed mobility comments: Min assist for balance with supine to  sit. HOB elevated with use of bed rails. Cues for sequencing and technique. Increased time required   Transfers Overall transfer level: Needs assistance Equipment used: 1 person hand held assist Transfers: Sit to/from Stand Sit to Stand: Min assist         General transfer comment: Min assist for balance. No c/o dizziness with initial sit to stand from EOB.    Balance Overall balance assessment: Needs assistance Sitting-balance support: Feet supported;No upper extremity supported Sitting balance-Leahy Scale: Fair     Standing balance support: Bilateral upper extremity supported Standing balance-Leahy Scale: Poor                              ADL Overall ADL's : Needs assistance/impaired     Grooming: Min guard;Sitting   Upper Body Bathing: Minimal assistance;Sitting   Lower Body Bathing: Total assistance;Bed level   Upper Body Dressing : Minimal assistance;Sitting   Lower Body Dressing: Min guard Lower Body Dressing Details (indicate cue type and reason): Pt able to don socks in sitting Toilet Transfer: Minimal assistance;Stand-pivot;BSC Toilet Transfer Details (indicate cue type and reason): Simulated by sit to stand with short distance functional mobility in room         Functional mobility during ADLs: Minimal assistance General ADL Comments: While assisting pt up to chair; pt with syncopal episode and period of unresponsiveness for ~90 seconds with bladder incontinence. Pt assisted to sitting on BSC; RN and RN tech x2 present and assisting with transfer back to bed. MD notified by RN. Pt denies dizziness with sitting EOB ~  5 minutes and with sit to stand from EOB.     Vision     Perception     Praxis      Pertinent Vitals/Pain Pain Assessment: No/denies pain     Hand Dominance Right   Extremity/Trunk Assessment Upper Extremity Assessment Upper Extremity Assessment: Generalized weakness   Lower Extremity Assessment Lower Extremity  Assessment: Defer to PT evaluation   Cervical / Trunk Assessment Cervical / Trunk Assessment: Kyphotic   Communication Communication Communication: HOH   Cognition Arousal/Alertness: Awake/alert Behavior During Therapy: WFL for tasks assessed/performed Overall Cognitive Status: History of cognitive impairments - at baseline                     General Comments       Exercises       Shoulder Instructions      Home Living Family/patient expects to be discharged to:: Private residence Living Arrangements: Spouse/significant other Available Help at Discharge: Family;Available 24 hours/day Type of Home: House Home Access: Stairs to enter [employer] of Steps: 1 Entrance Stairs-Rails: None Home Layout: One level     Bathroom Shower/Tub: Teacher, early years/pre: Standard     Home Equipment: Environmental consultant - 2 wheels          Prior Functioning/Environment Level of Independence: Needs assistance  Gait / Transfers Assistance Needed: Has a RW, but doesn't use it ADL's / Homemaking Assistance Needed: Pt able to complete BADL with supervision, husband assists with getting in/out of tub, husband does household management            OT Problem List: Decreased strength;Decreased activity tolerance;Impaired balance (sitting and/or standing);Decreased cognition;Decreased safety awareness;Decreased knowledge of use of DME or AE;Decreased knowledge of precautions   OT Treatment/Interventions: Self-care/ADL training;Energy conservation;DME and/or AE instruction;Therapeutic activities;Patient/family education;Balance training    OT Goals(Current goals can be found in the care plan section) Acute Rehab OT Goals Patient Stated Goal: Did not state OT Goal Formulation: With patient/family Time For Goal Achievement: 12/01/16 Potential to Achieve Goals: Good ADL Goals Pt Will Perform Grooming: with supervision;standing Pt Will Perform Upper Body Bathing: with  supervision;sitting Pt Will Perform Lower Body Bathing: with supervision;sit to/from stand Pt Will Transfer to Toilet: with supervision;ambulating;regular height toilet Pt Will Perform Toileting - Clothing Manipulation and hygiene: with supervision;sit to/from stand  OT Frequency: Min 2X/week   Barriers to D/C:            Co-evaluation              End of Session Equipment Utilized During Treatment: Gait belt Nurse Communication: Mobility status;Other (comment) (pt with syncopal episode with unresponsivness)  Activity Tolerance: Treatment limited secondary to medical complications (Comment) (syncopal episode with unresponsiveness) Patient left: in bed;with call bell/phone within reach;with bed alarm set;with family/visitor present   Time: [id number] OT Time Calculation (min): 32 min Charges:  OT General Charges $OT Visit: 1 Procedure OT Evaluation $OT Eval Moderate Complexity: 1 Procedure OT Treatments $Self Care/Home Management : 8-22 mins G-Codes:     Binnie Kand M.S., OTR/L Pager: [phone]  11/17/2016, 1:40 PM

## 2016-11-17 NOTE — Care Management Note (Addendum)
Case Management Note  Patient Details  Name: [name] [name] MRN: [id number] Date of Birth: 09/12/1941  Subjective/Objective:                 Patent admitted from home with sepsis, + bld cx, continuing to follow, IV Abx, IVF.    Action/Plan:  Anticipate DC to SNF when medically stable, will continue to follow for DC planning/ dispo. Addendum: 11/20/16 Patient and husband would like to follow with [facility]. Referral made to clinical liaison Butch Penny with [facility].   Expected Discharge Date:                  Expected Discharge Plan:  [place]  In-House Referral:     Discharge planning Services  CM Consult  Post Acute Care Choice:    Choice offered to:     DME Arranged:    DME Agency:     HH Arranged:    [place] Agency:     Status of Service:  In process, will continue to follow  If discussed at Long Length of Stay Meetings, dates discussed:    Additional Comments:  Carles Collet, RN 11/17/2016, 3:21 PM

## 2016-11-17 NOTE — Progress Notes (Signed)
Subjective: Patient is very tired and having a hard time collecting her thoughts after waking up. Able to follow commands.   Exam: Vitals:   11/16/16 2330 11/17/16 0638  BP:  (!) 157/54  Pulse:  (!) 105  Resp:  18  Temp: 98.8 F (37.1 C) (!) 100.5 F (38.1 C)        Gen: In bed, NAD MS: Confused, Able to follow commands.  CN: 2-12 intact Motor: MAEW--some bradykinesia UE Sensory: intact   Pertinent Labs/Diagnostics: Orthostatic with lying to sitting  Etta Quill PA-C [facility] [phone]  Impression: Likely syncope in the setting of UTI.  At this time would continue VPA at current dose and get trough level in AM 0500.  Continue to treat UTI. No further recommendations. Please call with questions. Neurology will S/O     11/17/2016, 10:31 AM

## 2016-11-17 NOTE — Progress Notes (Signed)
PT Cancellation Note  Patient Details Name: [name] [name] MRN: [id number] DOB: 12-Jul-1941   Cancelled Treatment:    Reason Eval/Treat Not Completed: Medical issues which prohibited therapy (Pt had reported seizure during OT session will cancel therapy for today.  )   Patryk Conant Eli Hose 11/17/2016, 12:51 PM  Governor Rooks, PTA pager [phone]

## 2016-11-18 DIAGNOSIS — G40309 Generalized idiopathic epilepsy and epileptic syndromes, not intractable, without status epilepticus: Secondary | ICD-10-CM

## 2016-11-18 DIAGNOSIS — N183 Chronic kidney disease, stage 3 (moderate): Secondary | ICD-10-CM

## 2016-11-18 DIAGNOSIS — G40409 Other generalized epilepsy and epileptic syndromes, not intractable, without status epilepticus: Secondary | ICD-10-CM

## 2016-11-18 DIAGNOSIS — N10 Acute tubulo-interstitial nephritis: Secondary | ICD-10-CM

## 2016-11-18 DIAGNOSIS — N289 Disorder of kidney and ureter, unspecified: Secondary | ICD-10-CM

## 2016-11-18 DIAGNOSIS — B9620 Unspecified Escherichia coli [E. coli] as the cause of diseases classified elsewhere: Secondary | ICD-10-CM | POA: Diagnosis present

## 2016-11-18 DIAGNOSIS — R55 Syncope and collapse: Secondary | ICD-10-CM

## 2016-11-18 DIAGNOSIS — R7881 Bacteremia: Secondary | ICD-10-CM

## 2016-11-18 DIAGNOSIS — E119 Type 2 diabetes mellitus without complications: Secondary | ICD-10-CM

## 2016-11-18 LAB — CULTURE, BLOOD (ROUTINE X 2)

## 2016-11-18 LAB — GLUCOSE, CAPILLARY
GLUCOSE-CAPILLARY: 224 mg/dL — AB (ref 65–99)
Glucose-Capillary: 115 mg/dL — ABNORMAL HIGH (ref 65–99)
Glucose-Capillary: 203 mg/dL — ABNORMAL HIGH (ref 65–99)
Glucose-Capillary: 196 mg/dL — ABNORMAL HIGH (ref 65–99)

## 2016-11-18 LAB — URINE CULTURE

## 2016-11-18 LAB — BASIC METABOLIC PANEL
Anion gap: 15 (ref 5–15)
BUN: 37 mg/dL — ABNORMAL HIGH (ref 6–20)
CALCIUM: 8.4 mg/dL — AB (ref 8.9–10.3)
CO2: 19 mmol/L — AB (ref 22–32)
CREATININE: 1.88 mg/dL — AB (ref 0.44–1.00)
Chloride: 109 mmol/L (ref 101–111)
GFR calc Af Amer: 29 mL/min — ABNORMAL LOW (ref >60)
GFR calc non Af Amer: 25 mL/min — ABNORMAL LOW (ref >60)
Glucose, Bld: 72 mg/dL (ref 65–99)
Potassium: 4.2 mmol/L (ref 3.5–5.1)
Sodium: 143 mmol/L (ref 135–145)

## 2016-11-18 LAB — CBC
HEMATOCRIT: 38.7 % (ref 36.0–46.0)
Hemoglobin: 12.4 g/dL (ref 12.0–15.0)
MCH: 31.6 pg (ref 26.0–34.0)
MCHC: 32 g/dL (ref 30.0–36.0)
MCV: 98.7 fL (ref 78.0–100.0)
PLATELETS: 180 10*3/uL (ref 150–400)
RBC: 3.92 MIL/uL (ref 3.87–5.11)
RDW: 13.8 % (ref 11.5–15.5)
WBC: 17.4 10*3/uL — ABNORMAL HIGH (ref 4.0–10.5)

## 2016-11-18 MED ORDER — SODIUM CHLORIDE 0.9 % IV SOLN
INTRAVENOUS | Status: AC
  Administered 2016-11-18: 19:00:00 via INTRAVENOUS

## 2016-11-18 MED ORDER — SODIUM CHLORIDE 0.9 % IV SOLN
500.0000 mg | Freq: Two times a day (BID) | INTRAVENOUS | Status: DC
  Administered 2016-11-18 – 2016-11-19 (×3): 500 mg via INTRAVENOUS
  Filled 2016-11-18 (×3): qty 0.5

## 2016-11-18 NOTE — Progress Notes (Signed)
   Subjective: [name] [name] was seen and evaluated today at bedside. She reports feeling much improved from yesterday. No more fevers or chills however reports she did not sleep well overnight secondary to confusion.   Objective:  Vital signs in last 24 hours: Vitals:   11/17/16 2133 11/18/16 0056 11/18/16 0221 11/18/16 0510  BP: (!) 158/56 (!) 114/41  (!) 156/58  Pulse: (!) 116 71  (!) 106  Resp: (!) 24 (!) 22  16  Temp: (!) 102.4 F (39.1 C) 98.6 F (37 C)  100.1 F (37.8 C)  TempSrc: Oral Oral  Oral  SpO2: 98% 91%  91%  Weight:   157 lb 8 oz (71.4 kg)   Height:       General: Elderly caucasian woman resting comfortably in bed. In no acute distress. Husband present at bedside. HENT: EOMI. No conjunctival injection, icterus or ptosis.   Cardiovascular: Regular rate and rhythm. No murmur or rub appreciated. Pulmonary: CTA BL, no wheezing. Unlabored breathing.  Abdomen: Soft, non-tender and non-distended. +bowel sounds.  Skin: Warm, dry. No cyanosis. Color improved.  Psych: Mood normal and affect was mood congruent. Responds to questions appropriately. Mild confusion Assessment/Plan:  Principal Problem:   Syncope Active Problems:   DM type 2 (diabetes mellitus, type 2) (HCC)   CKD (chronic kidney disease) stage 3, GFR 30-59 ml/min   Seizure disorder, grand mal ([place])   Aortic atherosclerosis (HCC)   UTI (urinary tract infection)  Sepsis, E.Coli Bacteremia, UTI Patient continued to remain febrile and tachycardic however mental status has improved significantly. With continued fevers and worsening status antibiotics were broadened today to Meropenem. Blood culture and sensitivity returned growing pansensitive E.Coli, notably sensitive to Ceftriaxone.  -Continue Meropenem, consider narrowing back to Ceftriaxone  -Pt received IVF rehydration  Type 2 Diabetes Blood sugars significantly improved since resuming insulin. No hypoglycemia overnight.  -Continue current management with  Levemir 10 units and ssi  Situational Syncope and Seizure disorder S/p valproic acid loading dose. Now on 750 mg PO daily. Pt felt weak yesterday during PT however no seizure activity was noted.   Dispo: Anticipated discharge in approximately 2 day(s).   [name] Pavey, DO 11/18/2016, 11:36 AM Pager: [phone]

## 2016-11-18 NOTE — Progress Notes (Signed)
Pharmacy Antibiotic Note  [name] [name] is a 76 y.o. female admitted on 11/15/2016 with syncopal episode.  Pharmacy consulted for Meropenem dosing.  Ceftriaxone 1 gm IV q24hrs initially ordered for UTI coverage, but increased from 1 to 2gm IV q24hrs on 1/14 due to BCID detected E coli. Sensitivities are now reported. Not ESBL, but has remained febrile of Ceftriaxone. Has received Tylenol 650 mg x 4 ove the last 2 days, for temps >101.  Unknown baseline creatinine, but currently trending down. Hx seizure disorder. Neuro evaluated 1/13, signed off 1/15. Syncopal episodes not thought to be seizures. Depakote loading dose given IV on 1/13 and daily dose was increased, due to low level (28) on admission.   Plan:  Meropenem 500 mg IV q12hrs.  Will follow renal function for any need to adjust regimen.  Follow clinical progress.  Height: 5\' 5"  (165.1 cm) Weight: 157 lb 8 oz (71.4 kg) IBW/kg (Calculated) : 57  Temp (24hrs), Avg:99.9 F (37.7 C), Min:98.4 F (36.9 C), Max:102.4 F (39.1 C)   Recent Labs Lab 11/15/16 1044 11/16/16 0616 11/17/16 0427 11/17/16 0833 11/17/16 1046 11/17/16 1519 11/17/16 2006 11/18/16 0511  WBC 23.1* 19.7* 20.3*  --   --   --   --  17.4*  CREATININE 1.78* 1.84* 2.46* 2.32* 2.26* 1.99* 2.02* 1.88*    Estimated Creatinine Clearance: 25.6 mL/min (by C-G formula based on SCr of 1.88 mg/dL (H)).    Allergies  Allergen Reactions  . Barbiturates     Becomes restless.  Also with all "strong" medications like sleeping pills  . Latex Itching    Antimicrobials this admission:  Ceftriaxone 1/14>>1/16  Meropenem 1/16>>  Dose adjustments this admission:  Ceftriaxone dose increased 1/14  Microbiology results:  1/14 BCID - E coli species detected  1/13 blood x 2 - E coli  in 1 of 2 cultures - pansensitive, 2nd cx with no growth x 2 days so far  1/13 urine - 60K/ml E coli - pansensitive  Thank you for allowing pharmacy to be a part of this patient's  care.  Arty Baumgartner, [place] Pager: [phone] 11/18/2016 12:20 PM

## 2016-11-18 NOTE — Progress Notes (Addendum)
  Date: 11/18/2016  Patient name: [name] [name]  Medical record number: [id number]  Date of birth: 08/18/1941   I have seen and evaluated this patient and I have discussed the plan of care with the house staff. Please see Dr. Rober Minion note for complete details. I concur with her findings with the following additions/corrections:     Patient also with an AKI or CKD, unclear given last baseline we have on file is from 2014.  This could certainly be related to an acute UTI and GNR sepsis.  Cr improved to 1.88 today from 2.02, with a peak of 2.46 this hospitalization.  She received IVF during her first few days of admission which have been stopped given her co-diagnosis of heart failure (grade 1 diastolic).  She is now eating a little and should be maintaining fluid intake.  I would err on the side of giving her more fluid today given that her AKI is not resolving as would be expected.  Her BP is well maintained.  On exam, she did have some developing edema in her legs.  She was not short of breath however.    Decrease IVF to NS at 75cc/hr for 12 hours more.  Reassess renal function in the AM.  If patient becomes SOB or has other issues, will d/c fluids.     Sid Falcon, MD 11/18/2016, 3:27 PM

## 2016-11-18 NOTE — Progress Notes (Signed)
   11/18/16 1020  Clinical Encounter Type  Visited With Patient and family together  Visit Type Other (Comment) ([facility] consult)  Spiritual Encounters  Spiritual Needs Emotional  Stress Factors  Patient Stress Factors None identified  Family Stress Factors None identified  Introduction to Pt and husband. No concerns raised. Offered a blessing.

## 2016-11-18 NOTE — Progress Notes (Signed)
Physical Therapy Treatment Patient Details Name: [name] [name] MRN: [id number] DOB: Mar 23, 1941 Today's Date: 11/18/2016    History of Present Illness 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     PT Comments    Applied B ted hose and abdominal binder in supine to maintain BP due to previous syncope episodes. Orthostatics checked in R arm (see vitals tab) prior to tx. No c/o of dizziness or lightheaded during transition. Observed full body tremor when pt in static standing position but pt still cognitively stable while communicating with PTA. Pt required one rest break during ambulation due to c/o feeling light headed. BP checked in L arm: 127/54 reclined, 104/87 in sitting, 110/77 in standing with another tremor noted. BP checked again after gait training trial #2 of 30 ft at 129/96 in standing. A third tremor was observed noted that seemed to be more severe, possibly due to fatigue. Pt rolled back to room in recliner and educated on pursed lip breathing.    Follow Up Recommendations  Home health PT;Supervision/Assistance - 24 hour     Equipment Recommendations  Wheelchair (measurements PT)    Recommendations for Other Services       Precautions / Restrictions Precautions Precautions: Fall Precaution Comments: hx of syncopal episode; hard of hearing Restrictions Weight Bearing Restrictions: No    Mobility  Bed Mobility Overal bed mobility: Needs Assistance Bed Mobility: Supine to Sit;Rolling     Supine to sit: Min assist;HOB elevated     General bed mobility comments: min A + bed rails to maintain patient's position in sidelying during application of abdominal binder. min  A for trunk elevation and leg repositioning to get pt to EOB.   Transfers Overall transfer level: Needs assistance Equipment used: Rolling walker (2 wheeled) Transfers: Sit to/from Stand Sit to Stand: Min assist         General transfer comment: Min assist for safety due to hx of syncopes   Ambulation/Gait Ambulation/Gait assistance: Min assist;+2 physical assistance Ambulation Distance (Feet): 40 Feet (+ 20 ft) Assistive device: Rolling walker (2 wheeled) Gait Pattern/deviations: Step-through pattern;Trunk flexed;Narrow base of support;Decreased stride length;Drifts right/left Gait velocity: decreased, required one rest break due to fatigue.    General Gait Details: difficulty with obstacle negotiation, required tactile cues for RW correction during L drifting. V/c for upright head and trunk posture and pt education for pursed lip breathing post ambulation.   Stairs            Wheelchair Mobility    Modified Rankin (Stroke Patients Only)       Balance Overall balance assessment: Needs assistance Sitting-balance support: Feet supported;No upper extremity supported Sitting balance-Leahy Scale: Fair     Standing balance support: Bilateral upper extremity supported Standing balance-Leahy Scale: Poor Standing balance comment: RW during standing activity.                    Cognition Arousal/Alertness: Awake/alert Behavior During Therapy: WFL for tasks assessed/performed Overall Cognitive Status: History of cognitive impairments - at baseline                      Exercises      General Comments  Pertinent Vitals/Pain Pain Assessment: No/denies pain    Home Living                      Prior Function            PT Goals (current goals can now be found in the care plan section) Acute Rehab PT Goals Patient Stated Goal: doesn't want to pass out again PT Goal Formulation: With patient/family Potential to Achieve Goals:  Good Progress towards PT goals: Progressing toward goals    Frequency    Min 3X/week      PT Plan Current plan remains appropriate    Co-evaluation             End of Session Equipment Utilized During Treatment: Gait belt (abdominal binder) Activity Tolerance: Patient limited by fatigue;Patient tolerated treatment well Patient left: in chair;with call bell/phone within reach;with family/visitor present;with chair alarm set     Time: 1327-1401 PT Time Calculation (min) (ACUTE ONLY): 34 min  Charges:  $Gait Training: 8-22 mins $Therapeutic Activity: 8-22 mins                    G Codes:      [facility] 2016-12-04, 2:46 PM  Olena Leatherwood, [place] Pager [phone]

## 2016-11-19 DIAGNOSIS — G40209 Localization-related (focal) (partial) symptomatic epilepsy and epileptic syndromes with complex partial seizures, not intractable, without status epilepticus: Secondary | ICD-10-CM

## 2016-11-19 LAB — BASIC METABOLIC PANEL
Anion gap: 8 (ref 5–15)
BUN: 25 mg/dL — AB (ref 6–20)
CHLORIDE: 111 mmol/L (ref 101–111)
CO2: 25 mmol/L (ref 22–32)
Calcium: 7.3 mg/dL — ABNORMAL LOW (ref 8.9–10.3)
Creatinine, Ser: 1.6 mg/dL — ABNORMAL HIGH (ref 0.44–1.00)
GFR calc Af Amer: 35 mL/min — ABNORMAL LOW (ref >60)
GFR, EST NON AFRICAN AMERICAN: 30 mL/min — AB (ref >60)
Glucose, Bld: 125 mg/dL — ABNORMAL HIGH (ref 65–99)
POTASSIUM: 2.7 mmol/L — AB (ref 3.5–5.1)
Sodium: 144 mmol/L (ref 135–145)

## 2016-11-19 LAB — CBC
HEMATOCRIT: 31.6 % — AB (ref 36.0–46.0)
HEMOGLOBIN: 10.1 g/dL — AB (ref 12.0–15.0)
MCH: 31.3 pg (ref 26.0–34.0)
MCHC: 32 g/dL (ref 30.0–36.0)
MCV: 97.8 fL (ref 78.0–100.0)
Platelets: 141 10*3/uL — ABNORMAL LOW (ref 150–400)
RBC: 3.23 MIL/uL — ABNORMAL LOW (ref 3.87–5.11)
RDW: 13.9 % (ref 11.5–15.5)
WBC: 9.9 10*3/uL (ref 4.0–10.5)

## 2016-11-19 LAB — GLUCOSE, CAPILLARY
GLUCOSE-CAPILLARY: 133 mg/dL — AB (ref 65–99)
GLUCOSE-CAPILLARY: 184 mg/dL — AB (ref 65–99)
Glucose-Capillary: 216 mg/dL — ABNORMAL HIGH (ref 65–99)
Glucose-Capillary: 142 mg/dL — ABNORMAL HIGH (ref 65–99)

## 2016-11-19 MED ORDER — POTASSIUM CHLORIDE CRYS ER 20 MEQ PO TBCR
40.0000 meq | EXTENDED_RELEASE_TABLET | Freq: Two times a day (BID) | ORAL | Status: DC
  Administered 2016-11-19 (×2): 40 meq via ORAL
  Filled 2016-11-19 (×4): qty 2

## 2016-11-19 MED ORDER — DEXTROSE 5 % IV SOLN
2.0000 g | INTRAVENOUS | Status: DC
  Administered 2016-11-19: 2 g via INTRAVENOUS
  Filled 2016-11-19 (×2): qty 2

## 2016-11-19 MED ORDER — DEXTROSE 5 % IV SOLN
1.0000 g | INTRAVENOUS | Status: DC
  Filled 2016-11-19: qty 10

## 2016-11-19 NOTE — Progress Notes (Signed)
  Date: 11/19/2016  Patient name: [name] [name]  Medical record number: [id number]  Date of birth: 02-04-1941   I have seen and evaluated this patient and I have discussed the plan of care with the house staff. Please see Dr. Rober Minion note for complete details. I concur with her findings.   Patient is improving.  Still with high normal temperature, but much improved clinically.  Agree with transition to Rocephin.  PT rec home health.  OT rec SNF.  She is 1-2 days from discharge.  Should discuss with family and reconcile different recommendations prior to discharge.   Sid Falcon, MD 11/19/2016, 1:59 PM

## 2016-11-19 NOTE — Progress Notes (Addendum)
Pharmacy Antibiotic Note  [name] [name] is a 76 y.o. female admitted on 11/15/2016 with syncope, noted to have a hx hemorrhagic CVA with residual deficits and seizure d/o.  Pharmacy has been consulted today 11/19/16 for Ceftriaxone dosing for UTI. Patient also with an AKI or CKD, unclear given last baseline we have on file is from 2014.  MD notes that this could  be related to an acute UTI and GNR sepsis.  SCr improved to 1.6, with a peak of 2.46 this hospitalization. CrCl ~ 30 ml/min.   Plan: Ceftriaxone 2 g IV q24h  Ceftriaxone  does not require adjustments for renal impairment. Pharmacy will sign off.   Please re-consult pharmacy if further assistance needed.   Height: 5\' 5"  (165.1 cm) Weight: 154 lb 12.2 oz (70.2 kg) IBW/kg (Calculated) : 57  Temp (24hrs), Avg:99.6 F (37.6 C), Min:99.1 F (37.3 C), Max:100.5 F (38.1 C)   Recent Labs Lab 11/15/16 1044 11/16/16 0616 11/17/16 0427  11/17/16 1046 11/17/16 1519 11/17/16 2006 11/18/16 0511 11/19/16 0640  WBC 23.1* 19.7* 20.3*  --   --   --   --  17.4* 9.9  CREATININE 1.78* 1.84* 2.46*  < > 2.26* 1.99* 2.02* 1.88* 1.60*  < > = values in this interval not displayed.  Estimated Creatinine Clearance: 29.9 mL/min (by C-G formula based on SCr of 1.6 mg/dL (H)).    Allergies  Allergen Reactions  . Barbiturates     Becomes restless.  Also with all "strong" medications like sleeping pills  . Latex Itching    Antimicrobials this admission: Ceftriaxone 1/14>>1/16;  restart 1/17>>  Meropenem 1/16>>1/17  Dose adjustments this admission:   Microbiology results: 1/14 BCID - E coli species detected 1/13 blood x 2 - E coli  in 1 of 2 cultures - pansensitive, 2nd one with ng x 2 days so far 1/13 urine - 60K/ml E coli - pansensitive   Thank you for allowing pharmacy to be a part of this patient's care. Nicole Cella, RPh Clinical Pharmacist Pager: [phone] After 4PM, [phone] 11/19/2016 11:44 AM

## 2016-11-19 NOTE — Progress Notes (Addendum)
Occupational Therapy Treatment Patient Details Name: [name] [name] MRN: [id number] DOB: 06-17-1941 Today's Date: 11/19/2016    History of present illness 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    OT comments  Pt able to perform functional mobility this session with min assist +2 for safety due to hx of syncopal episodes. Pt required min assist for balance during grooming task standing at the sink. Pt reports mild dizziness throughout session. Compression stockings and abdominal binder donned prior to mobility. D/c plan remains appropriate. Will continue to follow acutely.  BP in supine 141/61 Sitting 102/74 Standing 118/90 Standing after 3 minutes 99/88 Sitting 105/53   Follow Up Recommendations  SNF;Supervision/Assistance - 24 hour    Equipment Recommendations  3 in 1 bedside commode;Tub/shower seat    Recommendations for Other Services      Precautions / Restrictions Precautions Precautions: Fall Precaution Comments: hx of syncopal episode; hard of hearing Restrictions Weight Bearing Restrictions: No       Mobility Bed Mobility Overal bed mobility: Needs Assistance Bed Mobility: Supine to Sit;Rolling;Sit to Supine Rolling: Min guard   Supine to sit: Min assist Sit to supine: Min assist   General bed mobility comments: Light min assist for trunk elevation/return to supine. HOB flat with use of bed rail.  Transfers Overall transfer level: Needs assistance Equipment used: Rolling walker (2 wheeled) Transfers: Sit to/from Stand Sit to Stand: Min assist;+2 safety/equipment         General transfer comment: Min assist for balance and safety due to hx of  syncopal episodes.    Balance Overall balance assessment: Needs assistance Sitting-balance support: Feet supported Sitting balance-Leahy Scale: Fair     Standing balance support: No upper extremity supported;During functional activity Standing balance-Leahy Scale: Poor                     ADL Overall ADL's : Needs assistance/impaired     Grooming: Minimal assistance;Standing;Oral care;Sitting;Brushing hair Grooming Details (indicate cue type and reason): Pt able to perform oral care in standing then reporting fatigue and requesting to sit down. Performed brushing hair in sitting.                  Toilet Transfer: Minimal assistance;+2 for physical assistance;Ambulation;RW Toilet Transfer Details (indicate cue type and reason): Simulated by sit to stand from EOB with functional mobility.         Functional mobility during ADLs: Minimal assistance;+2 for physical assistance;Rolling walker General ADL Comments: Abdominal binder and compression stockings donned prior to mobility. BP in supine 141/61, sitting 102/74, standing 118/90, standing after 3 minutes 99/88, sitting 105/53.      Vision                     Perception     Praxis      Cognition   Behavior During Therapy: [facility] for tasks assessed/performed Overall Cognitive Status: History of cognitive impairments - at baseline                       Extremity/Trunk Assessment               Exercises  Shoulder Instructions       General Comments      Pertinent Vitals/ Pain       Pain Assessment: No/denies pain  Home Living                                          Prior Functioning/Environment              Frequency  Min 2X/week        Progress Toward Goals  OT Goals(current goals can now be found in the care plan section)  Progress towards OT goals: Progressing toward goals  Acute Rehab OT Goals Patient Stated Goal: doesn't want to pass  out again OT Goal Formulation: With patient  Plan Discharge plan remains appropriate    Co-evaluation    PT/OT/SLP Co-Evaluation/Treatment: Yes Reason for Co-Treatment: For patient/therapist safety;To address functional/ADL transfers   OT goals addressed during session: ADL's and self-care      End of Session Equipment Utilized During Treatment: Gait belt;Rolling walker   Activity Tolerance Patient tolerated treatment well   Patient Left in chair;with call bell/phone within reach;with chair alarm set   Nurse Communication Mobility status (pt coughing with sips of water)        Time: [id number] OT Time Calculation (min): 29 min  Charges: OT General Charges $OT Visit: 1 Procedure OT Treatments $Self Care/Home Management : 8-22 mins  Binnie Kand M.S., OTR/L Pager: [phone]  11/19/2016, 1:58 PM

## 2016-11-19 NOTE — Progress Notes (Signed)
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     PT Comments    Pt seen today still wearing ted hose and abdominal binder, readjusted prior to treatment. She was able to ambulate longer today with no c/o increased pain in back; required +2 for safety due to hx of syncopal episodes. Pt reported mild dizziness from sit to stand transfers and at end of gait training. Full body tremors observed when pt in static standing; noted to subside when interacting in verbal communication with PTA. Notified nurse of nausea and left pt in chair with feet elevated.  Pt will be able to return home with 24 supervision/ assistance and HHPT. Plan to continue monitoring vitals during treatment.   Vitals today:  BP in supine 141/61 Sitting 102/74 Standing 118/90 Standing after 3 minutes 99/88 Sitting 105/53      Follow Up Recommendations  Home health PT;Supervision/Assistance - 24 hour     Equipment Recommendations  Wheelchair (measurements PT)    Recommendations for Other Services       Precautions / Restrictions Precautions Precautions: Fall Precaution Comments: hx of syncopal episode Restrictions Weight Bearing Restrictions: No    Mobility  Bed Mobility Overal bed mobility: Needs Assistance Bed Mobility: Supine to Sit;Rolling;Sit to Supine Rolling: Min guard   Supine to sit: Min assist Sit to supine: Min assist   General bed mobility comments: Min guard to maintain sidelying for abdominal binder adjustment. Light min assist + use of rail  for trunk elevation  Transfers Overall transfer level: Needs assistance Equipment used: Rolling walker (2 wheeled) Transfers: Sit to/from Stand Sit to Stand: Min assist;+2 safety/equipment         General transfer comment: Min assist for balance and safety due to hx of syncopal episodes.  Ambulation/Gait Ambulation/Gait assistance: Min assist;+2 physical assistance Ambulation Distance (Feet): 100 Feet Assistive device: Rolling walker (2 wheeled) Gait Pattern/deviations: Step-through pattern;Trunk flexed;Narrow base of support;Decreased stride length Gait velocity: decreased   General Gait Details: difficulty with obstacle negotiation, required tactile cues for RW safety. v/c for upright head and trunk posture.   Stairs            Wheelchair Mobility    Modified Rankin (Stroke Patients Only)       Balance Overall balance assessment: Needs assistance Sitting-balance support: Feet supported Sitting balance-Leahy Scale: Fair     Standing balance support: During functional activity;Bilateral upper extremity supported;Single extremity supported Standing balance-Leahy Scale: Poor Standing balance comment: one HHA during ADL's at sink with CGA, RW required with gait                    Cognition Arousal/Alertness: Awake/alert Behavior During Therapy: WFL for tasks assessed/performed Overall Cognitive Status: History of cognitive impairments - at baseline                      Exercises      General Comments        Pertinent Vitals/Pain Pain Assessment: No/denies pain    Home Living  Prior Function            PT Goals (current goals can now be found in the care plan section) Acute Rehab PT Goals Patient Stated Goal: pt wants to get better to go home PT Goal Formulation: With patient Potential to Achieve Goals: Good Progress towards PT goals: Progressing toward goals    Frequency    Min 3X/week       PT Plan Current plan remains appropriate    Co-evaluation PT/OT/SLP Co-Evaluation/Treatment: Yes Reason for Co-Treatment: For patient/therapist safety;To address functional/ADL transfers PT goals addressed during session: Mobility/safety with mobility;Balance OT goals addressed during session: ADL's and self-care     End of Session Equipment Utilized During Treatment: Gait belt (abdominal binder and ted hose) Activity Tolerance: Patient limited by fatigue;Patient tolerated treatment well Patient left: in chair;with call bell/phone within reach;with chair alarm set     Time: 1204-1228 PT Time Calculation (min) (ACUTE ONLY): 24 min  Charges:  $Gait Training: 8-22 mins                    G Codes:      [facility] 11/21/2016, 2:41 PM  Olena Leatherwood, [place] Pager [phone]

## 2016-11-19 NOTE — Progress Notes (Signed)
   Subjective: Mrs. Ruzich was seen and evaluated today. Denied any acute events overnight.   Objective:  Vital signs in last 24 hours: Vitals:   11/18/16 1415 11/18/16 2109 11/19/16 0500 11/19/16 0540  BP: (!) 119/46 (!) 138/51  (!) 153/57  Pulse: 76 91  90  Resp: 18 18  18   Temp: 99.1 F (37.3 C) (!) 100.5 F (38.1 C)  99.1 F (37.3 C)  TempSrc:  Oral    SpO2: 95% 98%  93%  Weight:   154 lb 12.2 oz (70.2 kg)   Height:       General: Elderly caucasian woman resting comfortably in bed.  In no acute distress. HENT: EOMI. No conjunctival injection, icterus or ptosis. Mucous membranes moist.  Cardiovascular: Regular rate and rhythm. No murmur or rub appreciated. Pulmonary: Faint bibasilar crackles. Unlabored breathing.  Abdomen: Soft, non-tender. No guarding or rigidity. +bowel sounds.  Skin: Warm, dry. No cyanosis. Minimal edema BL LE. Psych: Mood normal and affect was mood congruent. Responds to questions appropriately.   Assessment/Plan:  Principal Problem:   Syncope Active Problems:   DM type 2 (diabetes mellitus, type 2) (HCC)   CKD (chronic kidney disease) stage 3, GFR 30-59 ml/min   Seizure disorder, grand mal ([place])   Aortic atherosclerosis ([place])   UTI (urinary tract infection)   Renal insufficiency   Bacteremia due to Escherichia coli  Sepsis, E.coli Bacteremia, UTI Improving. Received another 1L of IVF overnight. Continues to be somewhat febrile and occasionally tachycardic however vital signs have improved. Was broadened to Meropenem yesterday however will narrow to Ceftriaxone today as culture results growing pan-sensitive ecoli.  -Meropenem --> Ceftriaxone per pharm  Hypokalemia 2.7 this morning. Repleated orally with K-dur 59mEq bid. Will monitor with bmet in AM  Situational Syncope, Seizure Disorder Continue Valproic acid 750 mg PO daily  Type 2 diabetes Well controlled on current regimen of Levemir 10 units and SSI which will be continued.   Dispo:  Anticipated discharge in approximately 1 day(s).   Jahn Franchini, DO 11/19/2016, 9:20 AM Pager: [phone]

## 2016-11-19 NOTE — Progress Notes (Signed)
CRITICAL VALUE ALERT  Critical value received:  K  2.7 Date of notification:  11/19/16  Time of notification: 0816  Critical value read back:Yes.    Nurse who received alert:  Glenford Bayley   MD notified (1st page):  Dr. Danford Bad  Time of first page: [phone]  MD notified (2nd page): Dr Tiburcio Pea   Time of second [id number]   Responding MD:  Dr. Danford Bad  Time MD responded:  [id number]

## 2016-11-20 DIAGNOSIS — N3000 Acute cystitis without hematuria: Secondary | ICD-10-CM

## 2016-11-20 LAB — CBC
HEMATOCRIT: 34.8 % — AB (ref 36.0–46.0)
HEMOGLOBIN: 11.1 g/dL — AB (ref 12.0–15.0)
MCH: 31.1 pg (ref 26.0–34.0)
MCHC: 31.9 g/dL (ref 30.0–36.0)
MCV: 97.5 fL (ref 78.0–100.0)
Platelets: 170 10*3/uL (ref 150–400)
RBC: 3.57 MIL/uL — AB (ref 3.87–5.11)
RDW: 13.9 % (ref 11.5–15.5)
WBC: 10.7 10*3/uL — AB (ref 4.0–10.5)

## 2016-11-20 LAB — CULTURE, BLOOD (ROUTINE X 2): CULTURE: NO GROWTH

## 2016-11-20 LAB — BASIC METABOLIC PANEL
ANION GAP: 8 (ref 5–15)
BUN: 22 mg/dL — ABNORMAL HIGH (ref 6–20)
CHLORIDE: 108 mmol/L (ref 101–111)
CO2: 28 mmol/L (ref 22–32)
Calcium: 8.5 mg/dL — ABNORMAL LOW (ref 8.9–10.3)
Creatinine, Ser: 1.56 mg/dL — ABNORMAL HIGH (ref 0.44–1.00)
GFR calc non Af Amer: 31 mL/min — ABNORMAL LOW (ref >60)
GFR, EST AFRICAN AMERICAN: 36 mL/min — AB (ref >60)
Glucose, Bld: 155 mg/dL — ABNORMAL HIGH (ref 65–99)
POTASSIUM: 3.8 mmol/L (ref 3.5–5.1)
SODIUM: 144 mmol/L (ref 135–145)

## 2016-11-20 LAB — GLUCOSE, CAPILLARY
GLUCOSE-CAPILLARY: 141 mg/dL — AB (ref 65–99)
GLUCOSE-CAPILLARY: 172 mg/dL — AB (ref 65–99)
GLUCOSE-CAPILLARY: 159 mg/dL — AB (ref 65–99)

## 2016-11-20 MED ORDER — POTASSIUM CHLORIDE 20 MEQ PO PACK
40.0000 meq | PACK | Freq: Two times a day (BID) | ORAL | Status: DC
  Administered 2016-11-20 (×2): 40 meq via ORAL
  Filled 2016-11-20 (×3): qty 2

## 2016-11-20 MED ORDER — CEPHALEXIN 500 MG PO CAPS
500.0000 mg | ORAL_CAPSULE | Freq: Three times a day (TID) | ORAL | Status: DC
  Administered 2016-11-20 – 2016-11-21 (×3): 500 mg via ORAL
  Filled 2016-11-20 (×3): qty 1

## 2016-11-20 NOTE — Progress Notes (Addendum)
  Date: 11/20/2016  Patient name: [name] [name]  Medical record number: [id number]  Date of birth: 1941/07/15   I have seen and evaluated this patient and I have discussed the plan of care with the house staff. Please see Dr. Rober Minion note for complete details. I concur with his findings.   She is much improved today during rounds.  She is nearing discharge.  She had sepsis which was evolving on admission and this is now improving with IV antibiotics.  She will be switched to oral abx today.   Sid Falcon, MD 11/20/2016, 12:28 PM

## 2016-11-20 NOTE — Progress Notes (Signed)
.     Subjective: [name] [name] was seen and evaluated today at bedside. Reports she is feeling better. Was in the middle of eating breakfast. Denied any more fevers or chills.   Update: returned around 12 noon to speak with patient and her husband. Both patient and husband prefer that patient go home with [facility] PT and prefer discharge tomorrow.   Objective:  Vital signs in last 24 hours: Vitals:   11/20/16 0245 11/20/16 0500 11/20/16 0513 11/20/16 0840  BP: (!) 143/59  (!) 131/56 133/63  Pulse: 94  85 82  Resp: 18     Temp: 98.4 F (36.9 C)  99.9 F (37.7 C)   TempSrc:   Oral   SpO2: 97%  92%   Weight:  146 lb 9.7 oz (66.5 kg)    Height:       General: Elderly caucasian woman sitting upright in bed. Working on breakfast, In no acute distress.  HENT: EOMI. No conjunctival injection. Oropharynx clear, mucous membranes moist.  Cardiovascular: Regular rate and rhythm. No murmur or rub appreciated. Pulmonary: CTA BL, no wheezing, crackles appreciated. Unlabored breathing.  Abdomen: Soft, non-tender and non-distended. +bowel sounds.  Skin: Warm, dry. No cyanosis.  Psych: Mood normal and affect was mood congruent. Responds to questions appropriately.   Assessment/Plan:  Principal Problem:   Syncope Active Problems:   DM type 2 (diabetes mellitus, type 2) (HCC)   CKD (chronic kidney disease) stage 3, GFR 30-59 ml/min   Seizure disorder, grand mal ([place])   Aortic atherosclerosis ([place])   UTI (urinary tract infection)   Renal insufficiency   Bacteremia due to Escherichia coli   Partial symptomatic epilepsy with complex partial seizures, not intractable, without status epilepticus ([place])  E.coli Bacteremia, UTI, AKI Clinical status continues to improve. Afebrile overnight and not tachycardic. Narrowed to IV Ceftriaxone yesterday, will transition to oral today with PO Keflex 500mg  TID per pharm recs. Nearing discharge, will d/c home with [facility] PT per patient request.  -PO Keflex 500 mg TID per  pharm -Nearing discharge, will see how patient does with oral abx -renal function continues to improve, Cr 1.5 today -- unknown baseline   Hypokalemia Resolved.  Situational Syncope, Seizure Disorder Valproic acid 750 mg PO daily, lifestyle modifications  Dispo: Anticipated discharge tomorrow.   [name] Heffler, DO 11/20/2016, 10:08 AM Pager: [phone]

## 2016-11-21 LAB — BASIC METABOLIC PANEL
ANION GAP: 8 (ref 5–15)
BUN: 19 mg/dL (ref 6–20)
CO2: 28 mmol/L (ref 22–32)
Calcium: 8.4 mg/dL — ABNORMAL LOW (ref 8.9–10.3)
Chloride: 107 mmol/L (ref 101–111)
Creatinine, Ser: 1.69 mg/dL — ABNORMAL HIGH (ref 0.44–1.00)
GFR calc Af Amer: 33 mL/min — ABNORMAL LOW (ref >60)
GFR calc non Af Amer: 28 mL/min — ABNORMAL LOW (ref >60)
Glucose, Bld: 101 mg/dL — ABNORMAL HIGH (ref 65–99)
POTASSIUM: 4.3 mmol/L (ref 3.5–5.1)
SODIUM: 143 mmol/L (ref 135–145)

## 2016-11-21 LAB — GLUCOSE, CAPILLARY
GLUCOSE-CAPILLARY: 203 mg/dL — AB (ref 65–99)
GLUCOSE-CAPILLARY: 210 mg/dL — AB (ref 65–99)
Glucose-Capillary: 100 mg/dL — ABNORMAL HIGH (ref 65–99)

## 2016-11-21 MED ORDER — CEPHALEXIN 500 MG PO CAPS: 500.0000 mg | ORAL_CAPSULE | Freq: Three times a day (TID) | ORAL | 0 refills | Status: AC

## 2016-11-21 MED ORDER — POTASSIUM CHLORIDE 20 MEQ/15ML (10%) PO SOLN
40.0000 meq | Freq: Two times a day (BID) | ORAL | Status: DC
  Administered 2016-11-21: 40 meq via ORAL
  Filled 2016-11-21 (×2): qty 30

## 2016-11-21 NOTE — Progress Notes (Signed)
  Date: 11/21/2016  Patient name: [name] [name]  Medical record number: [id number]  Date of birth: 23-Mar-1941   I have seen and evaluated this patient and I have discussed the plan of care with the house staff. Please see Dr. Rober Minion note for complete details. I concur with her findings with the following additions/corrections:   [name] [name] blood pressure is improving.  Would consider restarting lasix on discharge if this helps her blood pressure.  She will also need quick follow up with her PCP next week to evaluate her BP.  She is improving from a sepsis standpoint and should complete a course of outpatient antibiotics.    Sid Falcon, MD 11/21/2016, 2:21 PM

## 2016-11-21 NOTE — Progress Notes (Signed)
Referral made for [facility] to be delivered to room prior to DC. Patient suffers from weakness which impairs their ability to perform daily activities like walking in the home. A walker will not resolve  issue with performing activities of daily living. A wheelchair will allow patient to safely perform daily activities. Patient is not able to propel themselves in the home using a standard weight wheelchair due to weakness. Patient can self propel in the lightweight wheelchair.  Accessories: elevating leg rests (ELRs), wheel locks, extensions and anti-tippers.

## 2016-11-21 NOTE — Progress Notes (Signed)
   Subjective: [name] [name] was seen and evaluated this morning at bedside. Was resting comfortably without distress. States she feels fine and is ready for discharge today. Continues to refuse SNF. Denied any complaints. Appreciates HH PT to assist her and her husband.   Objective:  Vital signs in last 24 hours: Vitals:   11/20/16 2210 11/20/16 2343 11/21/16 0446 11/21/16 0502  BP: (!) 141/73 (!) 145/62 (!) 153/63   Pulse: 97 90 89   Resp: 18 16 16    Temp:  98.8 F (37.1 C) 100.2 F (37.9 C)   TempSrc:  Oral Oral   SpO2: 92% 93% 93%   Weight:    145 lb 8.1 oz (66 kg)  Height:       General: Very pleasant elderly caucasian woman resting comfortably in bed. In no acute distress. HENT: EOMI. No conjunctival injection. Oropharynx clear, mucous membranes moist.  Cardiovascular: Regular rate and rhythm. No murmur or rub appreciated. Pulmonary: CTA BL, no wheezing, crackles or rhonchi appreciated. Unlabored breathing.  Abdomen: Soft, non-tender and non-distended. +bowel sounds.  Skin: Warm, dry. No cyanosis.  Psych: Mood normal and affect was mood congruent. Responds to questions appropriately.   Assessment/Plan:  Principal Problem:   Syncope Active Problems:   DM type 2 (diabetes mellitus, type 2) (HCC)   CKD (chronic kidney disease) stage 3, GFR 30-59 ml/min   Seizure disorder, grand mal ([place])   Aortic atherosclerosis ([place])   UTI (urinary tract infection)   Renal insufficiency   Bacteremia due to Escherichia coli   Partial symptomatic epilepsy with complex partial seizures, not intractable, without status epilepticus ([place])  Sepsis, E.coli Bacteremia, UTI Day 6/14 of therapy for GNR bacteremia. Improving well and tolerating PO Keflex currently. Afebrile overnight, high normal temp this morning at 100. Eating, drinking and urinating well.  -Anticipated discharge today with PO Keflex and close follow-up and strict return precautions -PO Keflex 500mg  TID to complete a 14 day  course -Renal fxn stable  Situational Syncope, Seizure Disorder Valproic acid and lifestyle modifications.   Dispo: Anticipated discharge today with HHPT as pt refuses SNF.   Arieh Bogue, DO 11/21/2016, 7:30 AM Pager: [phone]

## 2016-11-21 NOTE — Care Management Important Message (Signed)
Important Message  Patient Details  Name: [name] [name] MRN: [id number] Date of Birth: Jan 26, 1941   Medicare Important Message Given:  Yes    Nathen May 11/21/2016, 5:20 PM

## 2016-11-21 NOTE — Progress Notes (Signed)
D/c instructions reviewed with pt and her husband. Copy of instructions given to pt/husband. Pt waiting for wheelchair to be delivered to room, CM has already ordered it, to arrive soon. CM has arranged for [facility] services as ordered.

## 2016-11-21 NOTE — Discharge Instructions (Signed)
Please complete your course of oral antibiotics (Keflex). You will take this medication three times a day for another week after discharge. Please contact your physician or go to ED if you develop fevers, chills or have a change in mental status. You will need a follow-up appointment with your primary care physician after discharge.    Micturition-Associated Syncope: Situational syncope (passing-out) condition.  There is no specific treatment for micturition syncope. General advice to men with micturition syncope includes: to sit while urinating  to sit on the edge of the bed for a while before getting up and going to the toilet  to avoid urinating while sleepy  to urinate before sleep  to stop urination, cross the legs, and flex them immediately upon feeling faintness  Bacteremia Introduction Bacteremia is the presence of bacteria in the blood. A small amount of bacteria may not cause any symptoms. Sometimes, the bacteria spread and cause infection in other parts of the body, such as the heart, joints, bones, or brain. Having a great amount of bacteria can cause a serious, sometimes life-threatening infection called sepsis. What are the causes? This condition is caused by bacteria that get into the blood. Bacteria can enter the blood:  During a dental or medical procedure.  After you brush your teeth so hard that the gums bleed.  Through a scrape or cut on your skin. More severe types of bacteremia can be caused by:  A bacterial infection, such as pneumonia, that spreads to the blood.  Using a dirty needle. What increases the risk? This condition is more likely to develop in:  Children and elderly adults.  People who have a long-lasting (chronic) disease or medical condition.  People who have an artificial joint or heart valve.  People who have heart valve disease.  People who have a tube, such as a catheter or IV tube, that has been inserted for a medical treatment.  People  who have a weak body defense system (immune system).  People who use IV drugs. What are the signs or symptoms? Usually, this condition does not cause symptoms when it is mild. When it is more serious, it may cause:  Fever.  Chills.  Racing heart.  Shortness of breath.  Dizziness.  Weakness.  Confusion.  Nausea or vomiting.  Diarrhea. Bacteremia that has spread to other parts of the body may cause symptoms in those areas. How is this diagnosed? This condition may be diagnosed with a physical exam and tests, such as:  A complete blood count (CBC). This test looks for signs of infection.  Blood cultures. These look for bacteria in your blood.  Tests of any IV tubes. These look for a source of infection.  Urine tests.  Imaging tests, such as an X-ray, CT scan, MRI, or heart ultrasound. How is this treated? If the condition is mild, treatment is usually not needed. Usually, the bodys immune system will remove the bacteria. If the condition is more serious, it may be treated with:  Antibiotic medicines through an IV tube. These may be given for about 2 weeks. At first, the antibiotic that is given may kill most types of blood bacteria. If your test results show that a certain kind of bacteria is causing problems, the antibiotic may be changed to kill only the bacteria that are causing problems.  Antibiotics taken by mouth.  Removing any catheter or IV tube that is a source of infection.  Blood pressure and breathing support, if needed.  Surgery to control  the source or spread of infection, if needed. Follow these instructions at home:  Take over-the-counter and prescription medicines only as told by your health care provider.  If you were prescribed an antibiotic, take it as told by your health care provider. Do not stop taking the antibiotic even if you start to feel better.  Rest at home until your condition is under control.  Drink enough fluid to keep your urine  clear or pale yellow.  Keep all follow-up visits as told by your health care provider. This is important. How is this prevented? Take these actions to help prevent future episodes of bacteremia:  Get all vaccinations as recommended by your health care provider.  Clean and cover scrapes or cuts.  Bathe regularly.  Wash your hands often.  Before any dental or surgical procedure, ask your health care provider if you should take an antibiotic. Contact a health care provider if:  Your symptoms get worse.  You continue to have symptoms after treatment.  You develop new symptoms after treatment. Get help right away if:  You have chest pain or trouble breathing.  You develop confusion, dizziness, or weakness.  You develop pale skin. This information is not intended to replace advice given to you by your health care provider. Make sure you discuss any questions you have with your health care provider. Document Released: 08/03/2006 Document Revised: 05/09/2016 Document Reviewed: 12/23/2014  2017 Elsevier

## 2016-11-21 NOTE — Progress Notes (Signed)
Pt's wheelchair finally delivered to pt's room after several calls to unit case managers and them calling advanced home care equipment delivery service. Pt d/c'd home with husband with belongings, in her wheelchair escorted by unit NT.

## 2016-11-21 NOTE — Progress Notes (Signed)
Physical Therapy Treatment Patient Details Name: [name] [name] MRN: [id number] DOB: 1941-10-02 Today's Date: 11/21/2016    History of Present Illness 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     PT Comments    Pt presented with limitations today during treatment; pt expressed feelings of nausea throughout treatment, which limited ambulation. Pt refused second trial of ambulation. PTA notified MD of concerns with d/c to home with husband due to heightened symptoms during mobility. MD reports pt with situational syncope and will require behavior modifications at home. Informed case management for need of wheelchair at d/c with safety in community for distances.   Follow Up Recommendations  Home health PT;Supervision/Assistance - 24 hour     Equipment Recommendations  Wheelchair (measurements PT);Wheelchair cushion (measurements PT) (home health aide)    Recommendations for Other Services       Precautions / Restrictions Precautions Precautions: Fall Precaution Comments: hx of syncopal episode Restrictions Weight Bearing Restrictions: No    Mobility  Bed Mobility Overal bed mobility: Needs Assistance Bed Mobility: Rolling;Supine to Sit Rolling: Min guard   Supine to sit: Min assist     General bed mobility comments: Min A with supine to sit with increased time due to trunk weakness.  Transfers Overall transfer level: Needs assistance Equipment used: Rolling walker (2 wheeled) Transfers: Sit to/from Stand Sit to Stand: Min assist         General transfer comment: Min assist for safety due to hx of syncopal episodes. Pt required v/c for hand placement during  elevation off EOB  Ambulation/Gait Ambulation/Gait assistance: Min assist Ambulation Distance (Feet): 15 Feet   Gait Pattern/deviations: Trunk flexed;Narrow base of support;Decreased stride length;Shuffle;Antalgic;Drifts right/left Gait velocity: decreased   General Gait Details: pt distracted with nausea; poor obstacle negotiation and safety awareness. V/c for upright posture due to pt allowing head to fall forward.   Stairs            Wheelchair Mobility    Modified Rankin (Stroke Patients Only)       Balance Overall balance assessment: Needs assistance Sitting-balance support: Feet supported Sitting balance-Leahy Scale: Fair     Standing balance support: During functional activity;Bilateral upper extremity supported Standing balance-Leahy Scale: Poor Standing balance comment: use of RW during static standing due to hx of syncope                    Cognition Arousal/Alertness: Awake/alert Behavior During Therapy: WFL for tasks assessed/performed Overall Cognitive Status: History of cognitive impairments - at baseline                      Exercises      General Comments        Pertinent Vitals/Pain Pain Assessment: Faces Pain Score: 4  Pain Descriptors / Indicators: Stabbing;Moaning    Home Living                      Prior Function            PT Goals (current goals can now be found in the care plan section) Acute Rehab PT Goals Patient Stated Goal: Pt/husband want to go home PT  Goal Formulation: With patient/family Potential to Achieve Goals: Good Progress towards PT goals: Progressing toward goals (slow progression; remains difficult due to pre-syncopal episodes.)    Frequency    Min 3X/week      PT Plan Current plan remains appropriate    Co-evaluation             End of Session Equipment Utilized During Treatment: Gait belt (abdominal binder and ted hose) Activity Tolerance: Patient limited by  fatigue;Patient limited by pain Patient left: in chair;with call bell/phone within reach;with chair alarm set;with family/visitor present     Time: [id number] PT Time Calculation (min) (ACUTE ONLY): 26 min  Charges:  $Gait Training: 8-22 mins $Therapeutic Activity: 8-22 mins                    G Codes:      [facility] Dec 07, 2016, 1:06 PM [place], [place] Pager [phone]

## 2016-11-21 NOTE — Progress Notes (Signed)
LM with [facility] for delivery of RW prior to DC

## 2016-11-21 NOTE — Progress Notes (Addendum)
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    OT comments  This session focused on administering weighted utensils and weighted cup for pt's use to decrease tremors during self feeding and increase independence with self-feeding. Also discussed importance of 24/7 supervision and w/c for mobility to decrease fall risk due to h/o syncope.    Follow Up Recommendations  Supervision/Assistance - 24 hour;Home health OT    Equipment Recommendations  3 in 1 bedside commode;Tub/shower seat    Recommendations for Other Services  None at this time   Precautions / Restrictions Precautions Precautions: Fall Precaution Comments: hx of syncopal episode Restrictions Weight Bearing Restrictions: No    Mobility - Per PT note today Bed Mobility Overal bed mobility: Needs Assistance Bed Mobility: Rolling;Supine to Sit Rolling: Min guard   Supine to sit: Min assist     General bed mobility comments: Min A with supine to sit with increased time due to trunk weakness.  Transfers Overall transfer level: Needs assistance Equipment used: Rolling walker (2 wheeled) Transfers: Sit to/from Stand Sit to Stand: Min assist         General transfer comment: Min assist for safety due to hx of syncopal episodes. Pt required v/c for hand placement during elevation off EOB    Balance Overall balance assessment: Needs assistance Sitting-balance support: Feet supported Sitting balance-Leahy Scale: Fair     Standing balance support: During  functional activity;Bilateral upper extremity supported Standing balance-Leahy Scale: Poor Standing balance comment: use of RW during static standing due to hx of syncope   ADL Overall ADL's : Needs assistance/impaired Eating/Feeding: Minimal assistance Eating/Feeding Details (indicate cue type and reason): due to tremors, administerred weighted utensils and weighted cup to help decrease tremors and increase self-feeding independence   General ADL Comments: Discussed need for 24/7 supervision due to patient's history of syncope. Talked with pt and spouse regarding importance of using w/c for mobility due to syncope.                 Cognition   Behavior During Therapy: WFL for tasks assessed/performed Overall Cognitive Status: History of cognitive impairments - at baseline                 Pertinent Vitals/ Pain       Pain Assessment: No/denies pain Pain Score: 4  Pain Descriptors / Indicators: Stabbing;Moaning         Frequency  Min 2X/week      Progress Toward Goals  OT Goals(current goals can now be found in the care plan section)  Progress towards OT goals: Progressing toward goals  Acute Rehab OT Goals Patient Stated Goal: Go home today OT Goal Formulation: With patient Time For Goal Achievement: 12/01/16 Potential to Achieve Goals: Good  Plan Discharge plan needs to be updated    Activity Tolerance Patient tolerated treatment well   Patient Left in chair;with call bell/phone within reach;with chair alarm set;with family/visitor present     Time: [id number] OT Time Calculation (min): 22 min  Charges: OT General Charges $OT Visit: 1 Procedure OT  Treatments $Self Care/Home Management : 8-22 mins  Chrys Racer , MS, OTR/L, [place] Pager: [phone] 11/21/2016, 4:22 PM

## 2016-11-23 NOTE — Discharge Summary (Signed)
Name: [name] [name] MRN: [id number] DOB: Mar 25, 1941 76 y.o. PCP: [name], MD  Date of Admission: 11/15/2016 10:08 AM Date of Discharge: 11/21/2016 Attending Physician: Gilles Chiquito, MD  Discharge Diagnosis: 1. Situational C Gupp[place] 2. Bacteremia due to Escherichia coli, urinary tract infection 3. Seizure disorder Principal Problem:   Syncope Active Problems:   DM type 2 (diabetes mellitus, type 2) (HCC)   CKD (chronic kidney disease) stage 3, GFR 30-59 ml/min   Seizure disorder, grand mal ([place])   Aortic atherosclerosis ([place])   UTI (urinary tract infection)   Renal insufficiency   Bacteremia due to Escherichia coli   Partial symptomatic epilepsy with complex partial seizures, not intractable, without status epilepticus ([place])   Discharge Medications: Allergies as of 11/21/2016      Reactions   Barbiturates    Becomes restless.  Also with all "strong" medications like sleeping pills   Latex Itching      Medication List    TAKE these medications   acetaminophen 325 MG tablet Commonly known as:  TYLENOL Take 650 mg by mouth every 6 (six) hours as needed for moderate pain or headache.   aspirin 81 MG tablet Take 81 mg by mouth daily.   atorvastatin 10 MG tablet Commonly known as:  LIPITOR Take 10 mg by mouth daily.   cephALEXin 500 MG capsule Commonly known as:  KEFLEX Take 1 capsule (500 mg total) by mouth 3 (three) times daily.   diltiazem 90 MG tablet Commonly known as:  CARDIZEM Take 90 mg by mouth 2 (two) times daily.   divalproex 250 MG 24 hr tablet Commonly known as:  DEPAKOTE ER Take 3 tablets (750 mg total) by mouth daily. What changed:  medication strength  how much to take   fluocinonide cream 0.05 % Commonly known as:  LIDEX Apply 1 application topically daily.   furosemide 20 MG tablet Commonly known as:  LASIX Take 20 mg by mouth daily.   hydrOXYzine 25 MG tablet Commonly known as:  ATARAX/VISTARIL TAKE 1 TABLET BY MOUTH AT BEDTIME AS  NEEDED FOR ITCHING   LEVEMIR FLEXTOUCH 100 UNIT/ML Pen Generic drug:  Insulin Detemir INJECT 10 UNITS SUBCUTANEOUSLY DAILY AT NIGHT   Magnesium 500 MG Tabs Take 1 tablet (500 mg total) by mouth 3 (three) times daily. What changed:  when to take this   metoprolol tartrate 25 MG tablet Commonly known as:  LOPRESSOR Take 25 mg by mouth 2 (two) times daily.   multivitamin capsule Take 1 capsule by mouth daily.   NOVOLOG FLEXPEN 100 UNIT/ML FlexPen Generic drug:  insulin aspart Inject 4-9 Units into the skin 3 (three) times daily with meals. Per sliding scale, 4 units if over 200 = 5 units in the morning and at lunch and 8 units if over 200 = 9 units.   omeprazole 20 MG capsule Commonly known as:  PRILOSEC Take 20 mg by mouth daily.   vitamin C 500 MG tablet Commonly known as:  ASCORBIC ACID Take 500 mg by mouth daily.       Disposition and follow-up:   Ms.Crystalmarie Pelham was discharged from [facility] in Stable condition.  At the hospital follow up visit please address:  1.  Situational syncope: Aha patient had any more syncopal episodes. Is she  adhering to lifestyle modifications to reduce her chance of further syncopal episodes UTI, Escherichia coli bacteremia: She completed her course of oral Keflex. Any more fevers, weakness or myalgias at home Seizure disorder:  adherent  to her increased dose of valproic acid (750 mg)?  2.  Labs / imaging needed at time of follow-up: None  3.  Pending labs/ test needing follow-up: None  Follow-up Appointments: Follow-up Information    LITTLE,JAMES, MD. Schedule an appointment as soon as possible for a visit in 1 week(s).   Specialty:  Family Medicine Contact information: [address] [place] [address] [phone]           Hospital Course by problem list: Principal Problem:   Syncope Active Problems:   DM type 2 (diabetes mellitus, type 2) (HCC)   CKD (chronic kidney disease) stage 3, GFR 30-59  ml/min   Seizure disorder, grand mal (HCC)   Aortic atherosclerosis (HCC)   UTI (urinary tract infection)   Renal insufficiency   Bacteremia due to Escherichia coli   Partial symptomatic epilepsy with complex partial seizures, not intractable, without status epilepticus ([place])   1. Syncope Mr. Khiya Westhoff is a 76 year old woman with medical history significant for micturition associated syncope who presents with a syncopal episode. The patient had no recollection of the event however her husband reports that he was assisting her to sit down to urinate when her eyes rolled back in her head her head dropped and she lost consciousness. He then laid her down on the floor and called EMS. Upon arrival to the emergency department vital signs are stable. EKG unchanged from priors and CT head was negative for acute process. Orthostats were negative and labs were unremarkable except for a urinalysis consistent with UTI and leukocytosis. She had 1 additional syncopal episode during hospitalization which occurred during micturition. Neurology was consulted who agreed that this is most consistent with this situational syncope and that lifestyle modifications are indicated. She was seen and evaluated by physical therapy who recommended home health PT which had been ordered on discharge.  2. Sepsis due to E.coli Bacteremia, UTI Urinalysis was suspicious for infection on admission and the patient also endorsed dysuria, weakness and suprapubic discomfort. She was given IV Rocephin upon admission and reported some improvement of her symptoms the following morning. Blood cultures obtained on admission returned growing pansensitive Escherichia coli and were adjusted to 2 g IV ceftriaxone accordingly. On this therapy, the patient continued to spike fevers and feel unwell so antibiotics were broadened to IV meropenem. Following receiving sensitivities she was transitioned back to ceftriaxone and then to oral Keflex. Remained  afebrile 24 hours prior to discharge.  3. Seizure disorder Patient's presentation not consistent with seizure. Valproic acid level was subtherapeutic at 29 despite reported compliance with her dose of 500 mg daily. She was subsequently given a loading dose with IV valproic acid 750 mg and her home dose was increased to 750 mg by mouth daily.  Discharge Vitals:   BP (!) 137/46   Pulse 84   Temp 99 F (37.2 C) (Oral)   Resp 20   Ht 5\' 5"  (1.651 m)   Wt 145 lb 8.1 oz (66 kg)   SpO2 98%   BMI 24.21 kg/m   Pertinent Labs, Studies, and Procedures:  Urinalysis: Rare bacteria, moderate leukocytes, too numerous to count white blood cells Blood cultures: Pansensitive Escherichia coli CBC: Leukocytosis Bmet hypokalemia which was replaced orally  Discharge Instructions: Please take your prescribed course of oral Keflex 3 times daily for 1 week. Please return immediately to the emergency department or contact her doctor if her symptoms should progress or worsen.  Signed: Janilah Hojnacki, DO 11/23/2016, 1:40 PM  Pager: [phone]

## 2016-12-18 ENCOUNTER — Other Ambulatory Visit: Payer: Self-pay | Admitting: Internal Medicine

## 2016-12-23 ENCOUNTER — Telehealth: Payer: Self-pay | Admitting: Neurology

## 2016-12-23 ENCOUNTER — Other Ambulatory Visit: Payer: Self-pay

## 2016-12-23 MED ORDER — DIVALPROEX SODIUM ER 250 MG PO TB24: 750.0000 mg | ORAL_TABLET | Freq: Every day | ORAL | 1 refills | Status: DC

## 2016-12-23 NOTE — Telephone Encounter (Signed)
Medication refill done for 6 months 90 day supply sent to pharmacy.

## 2016-12-23 NOTE — Telephone Encounter (Signed)
Patients husband called requesting refill for divalproex (DEPAKOTE ER) 250 MG 24 hr tablet.

## 2017-03-09 ENCOUNTER — Encounter: Payer: Self-pay | Admitting: Podiatry

## 2017-03-09 ENCOUNTER — Ambulatory Visit (INDEPENDENT_AMBULATORY_CARE_PROVIDER_SITE_OTHER): Payer: Federal, State, Local not specified - PPO | Admitting: Podiatry

## 2017-03-09 DIAGNOSIS — E0842 Diabetes mellitus due to underlying condition with diabetic polyneuropathy: Secondary | ICD-10-CM

## 2017-03-09 DIAGNOSIS — B351 Tinea unguium: Secondary | ICD-10-CM

## 2017-03-09 DIAGNOSIS — M79609 Pain in unspecified limb: Secondary | ICD-10-CM

## 2017-03-10 ENCOUNTER — Ambulatory Visit: Payer: Federal, State, Local not specified - PPO | Admitting: Podiatry

## 2017-03-11 NOTE — Progress Notes (Signed)
   SUBJECTIVE Patient with a history of diabetes mellitus presents to office today complaining of elongated, thickened nails. Pain while ambulating in shoes. Patient is unable to trim their own nails. She reports pain especially in the nail of the right great toenail. She reports associated clear drainage. She also has pain on the lateral side of the left great toenail with bloody discharge.   OBJECTIVE General Patient is awake, alert, and oriented x 3 and in no acute distress. Derm Skin is dry and supple bilateral. Negative open lesions or macerations. Remaining integument unremarkable. Nails are tender, long, thickened and dystrophic with subungual debris, consistent with onychomycosis, 1-5 bilateral. No signs of infection noted. Vasc  DP and PT pedal pulses palpable bilaterally. Temperature gradient within normal limits.  Neuro Epicritic and protective threshold sensation diminished bilaterally.  Musculoskeletal Exam No symptomatic pedal deformities noted bilateral. Muscular strength within normal limits.  ASSESSMENT 1. Diabetes Mellitus w/ peripheral neuropathy 2. Onychomycosis of nail due to dermatophyte bilateral 3. Pain in foot bilateral  PLAN OF CARE 1. Patient evaluated today. 2. Instructed to maintain good pedal hygiene and foot care. Stressed importance of controlling blood sugar.  3. Mechanical debridement of nails 1-5 bilaterally performed using a nail nipper. Filed with dremel without incident.  4. Return to clinic in 3 mos.     Edrick Kins, DPM [facility]  Dr. Edrick Kins, [address]                                        [place], [place] [address]                Office [phone]  Fax [phone]

## 2017-05-01 ENCOUNTER — Other Ambulatory Visit: Payer: Self-pay | Admitting: Nephrology

## 2017-05-01 DIAGNOSIS — N179 Acute kidney failure, unspecified: Secondary | ICD-10-CM

## 2017-05-01 DIAGNOSIS — N183 Chronic kidney disease, stage 3 (moderate): Principal | ICD-10-CM

## 2017-05-12 ENCOUNTER — Ambulatory Visit
Admission: RE | Admit: 2017-05-12 | Discharge: 2017-05-12 | Disposition: A | Discharge status: Home / Self Care | Payer: Medicare Other | Source: Ambulatory Visit | Attending: Nephrology | Admitting: Nephrology

## 2017-05-12 DIAGNOSIS — N179 Acute kidney failure, unspecified: Secondary | ICD-10-CM

## 2017-05-12 DIAGNOSIS — N183 Chronic kidney disease, stage 3 (moderate): Principal | ICD-10-CM

## 2017-05-25 ENCOUNTER — Other Ambulatory Visit: Payer: Self-pay | Admitting: Family Medicine

## 2017-05-25 DIAGNOSIS — Z1231 Encounter for screening mammogram for malignant neoplasm of breast: Secondary | ICD-10-CM

## 2017-06-01 ENCOUNTER — Ambulatory Visit
Admission: RE | Admit: 2017-06-01 | Discharge: 2017-06-01 | Disposition: A | Discharge status: Home / Self Care | Payer: Medicare Other | Source: Ambulatory Visit | Attending: Family Medicine | Admitting: Family Medicine

## 2017-06-01 DIAGNOSIS — Z1231 Encounter for screening mammogram for malignant neoplasm of breast: Secondary | ICD-10-CM

## 2017-06-03 ENCOUNTER — Other Ambulatory Visit: Payer: Self-pay | Admitting: Family Medicine

## 2017-06-03 DIAGNOSIS — R928 Other abnormal and inconclusive findings on diagnostic imaging of breast: Secondary | ICD-10-CM

## 2017-06-05 ENCOUNTER — Encounter (HOSPITAL_COMMUNITY): Payer: Self-pay | Admitting: Emergency Medicine

## 2017-06-05 ENCOUNTER — Other Ambulatory Visit: Payer: Self-pay | Admitting: Family Medicine

## 2017-06-05 ENCOUNTER — Emergency Department (HOSPITAL_COMMUNITY): Payer: Medicare Other

## 2017-06-05 ENCOUNTER — Ambulatory Visit
Admission: RE | Admit: 2017-06-05 | Discharge: 2017-06-05 | Disposition: A | Discharge status: Home / Self Care | Payer: Medicare Other | Source: Ambulatory Visit | Attending: Family Medicine | Admitting: Family Medicine

## 2017-06-05 ENCOUNTER — Inpatient Hospital Stay (HOSPITAL_COMMUNITY)
Admission: EM | Admit: 2017-06-05 | Discharge: 2017-06-11 | DRG: 871 | Disposition: A | Discharge status: Home / Self Care | Payer: Medicare Other | Attending: Internal Medicine | Admitting: Internal Medicine

## 2017-06-05 DIAGNOSIS — N179 Acute kidney failure, unspecified: Secondary | ICD-10-CM | POA: Diagnosis present

## 2017-06-05 DIAGNOSIS — Z7189 Other specified counseling: Secondary | ICD-10-CM | POA: Diagnosis not present

## 2017-06-05 DIAGNOSIS — G40409 Other generalized epilepsy and epileptic syndromes, not intractable, without status epilepticus: Secondary | ICD-10-CM | POA: Diagnosis present

## 2017-06-05 DIAGNOSIS — Z91040 Latex allergy status: Secondary | ICD-10-CM | POA: Diagnosis not present

## 2017-06-05 DIAGNOSIS — I10 Essential (primary) hypertension: Secondary | ICD-10-CM | POA: Diagnosis present

## 2017-06-05 DIAGNOSIS — E1122 Type 2 diabetes mellitus with diabetic chronic kidney disease: Secondary | ICD-10-CM | POA: Diagnosis present

## 2017-06-05 DIAGNOSIS — E1142 Type 2 diabetes mellitus with diabetic polyneuropathy: Secondary | ICD-10-CM | POA: Diagnosis present

## 2017-06-05 DIAGNOSIS — R55 Syncope and collapse: Secondary | ICD-10-CM | POA: Diagnosis not present

## 2017-06-05 DIAGNOSIS — N136 Pyonephrosis: Secondary | ICD-10-CM | POA: Diagnosis present

## 2017-06-05 DIAGNOSIS — E861 Hypovolemia: Secondary | ICD-10-CM | POA: Diagnosis present

## 2017-06-05 DIAGNOSIS — G9341 Metabolic encephalopathy: Secondary | ICD-10-CM | POA: Diagnosis present

## 2017-06-05 DIAGNOSIS — N183 Chronic kidney disease, stage 3 unspecified: Secondary | ICD-10-CM | POA: Diagnosis present

## 2017-06-05 DIAGNOSIS — J181 Lobar pneumonia, unspecified organism: Secondary | ICD-10-CM | POA: Diagnosis not present

## 2017-06-05 DIAGNOSIS — Z9842 Cataract extraction status, left eye: Secondary | ICD-10-CM

## 2017-06-05 DIAGNOSIS — R7881 Bacteremia: Secondary | ICD-10-CM | POA: Diagnosis not present

## 2017-06-05 DIAGNOSIS — E1121 Type 2 diabetes mellitus with diabetic nephropathy: Secondary | ICD-10-CM | POA: Diagnosis present

## 2017-06-05 DIAGNOSIS — Z7982 Long term (current) use of aspirin: Secondary | ICD-10-CM

## 2017-06-05 DIAGNOSIS — E785 Hyperlipidemia, unspecified: Secondary | ICD-10-CM | POA: Diagnosis present

## 2017-06-05 DIAGNOSIS — I5032 Chronic diastolic (congestive) heart failure: Secondary | ICD-10-CM | POA: Diagnosis present

## 2017-06-05 DIAGNOSIS — Z794 Long term (current) use of insulin: Secondary | ICD-10-CM

## 2017-06-05 DIAGNOSIS — I69319 Unspecified symptoms and signs involving cognitive functions following cerebral infarction: Secondary | ICD-10-CM

## 2017-06-05 DIAGNOSIS — I130 Hypertensive heart and chronic kidney disease with heart failure and stage 1 through stage 4 chronic kidney disease, or unspecified chronic kidney disease: Secondary | ICD-10-CM | POA: Diagnosis present

## 2017-06-05 DIAGNOSIS — R531 Weakness: Secondary | ICD-10-CM | POA: Diagnosis not present

## 2017-06-05 DIAGNOSIS — Z79899 Other long term (current) drug therapy: Secondary | ICD-10-CM | POA: Diagnosis not present

## 2017-06-05 DIAGNOSIS — E860 Dehydration: Secondary | ICD-10-CM | POA: Diagnosis present

## 2017-06-05 DIAGNOSIS — G9340 Encephalopathy, unspecified: Secondary | ICD-10-CM | POA: Diagnosis not present

## 2017-06-05 DIAGNOSIS — A4151 Sepsis due to Escherichia coli [E. coli]: Secondary | ICD-10-CM | POA: Diagnosis present

## 2017-06-05 DIAGNOSIS — E43 Unspecified severe protein-calorie malnutrition: Secondary | ICD-10-CM | POA: Diagnosis not present

## 2017-06-05 DIAGNOSIS — I951 Orthostatic hypotension: Secondary | ICD-10-CM | POA: Diagnosis not present

## 2017-06-05 DIAGNOSIS — A419 Sepsis, unspecified organism: Secondary | ICD-10-CM | POA: Diagnosis not present

## 2017-06-05 DIAGNOSIS — G40209 Localization-related (focal) (partial) symptomatic epilepsy and epileptic syndromes with complex partial seizures, not intractable, without status epilepticus: Secondary | ICD-10-CM | POA: Diagnosis present

## 2017-06-05 DIAGNOSIS — R928 Other abnormal and inconclusive findings on diagnostic imaging of breast: Secondary | ICD-10-CM

## 2017-06-05 DIAGNOSIS — D539 Nutritional anemia, unspecified: Secondary | ICD-10-CM

## 2017-06-05 DIAGNOSIS — K219 Gastro-esophageal reflux disease without esophagitis: Secondary | ICD-10-CM | POA: Diagnosis present

## 2017-06-05 DIAGNOSIS — N6320 Unspecified lump in the left breast, unspecified quadrant: Secondary | ICD-10-CM

## 2017-06-05 DIAGNOSIS — I69119 Unspecified symptoms and signs involving cognitive functions following nontraumatic intracerebral hemorrhage: Secondary | ICD-10-CM

## 2017-06-05 DIAGNOSIS — R4182 Altered mental status, unspecified: Secondary | ICD-10-CM

## 2017-06-05 DIAGNOSIS — Z961 Presence of intraocular lens: Secondary | ICD-10-CM | POA: Diagnosis present

## 2017-06-05 DIAGNOSIS — Z515 Encounter for palliative care: Secondary | ICD-10-CM | POA: Diagnosis not present

## 2017-06-05 DIAGNOSIS — Z9841 Cataract extraction status, right eye: Secondary | ICD-10-CM | POA: Diagnosis not present

## 2017-06-05 DIAGNOSIS — E118 Type 2 diabetes mellitus with unspecified complications: Secondary | ICD-10-CM

## 2017-06-05 DIAGNOSIS — E119 Type 2 diabetes mellitus without complications: Secondary | ICD-10-CM | POA: Diagnosis not present

## 2017-06-05 DIAGNOSIS — N189 Chronic kidney disease, unspecified: Secondary | ICD-10-CM

## 2017-06-05 DIAGNOSIS — Z885 Allergy status to narcotic agent status: Secondary | ICD-10-CM

## 2017-06-05 LAB — PROTIME-INR
INR: 0.9
PROTHROMBIN TIME: 12.2 s (ref 11.4–15.2)

## 2017-06-05 LAB — URINALYSIS, ROUTINE W REFLEX MICROSCOPIC
BACTERIA UA: NONE SEEN
Bilirubin Urine: NEGATIVE
Glucose, UA: NEGATIVE mg/dL
HGB URINE DIPSTICK: NEGATIVE
Ketones, ur: NEGATIVE mg/dL
Leukocytes, UA: NEGATIVE
NITRITE: NEGATIVE
PH: 8 (ref 5.0–8.0)
Protein, ur: 30 mg/dL — AB
SPECIFIC GRAVITY, URINE: 1.011 (ref 1.005–1.030)
SQUAMOUS EPITHELIAL / LPF: NONE SEEN

## 2017-06-05 LAB — COMPREHENSIVE METABOLIC PANEL
ALK PHOS: 116 U/L (ref 38–126)
ALT: 13 U/L — AB (ref 14–54)
AST: 27 U/L (ref 15–41)
Albumin: 3.1 g/dL — ABNORMAL LOW (ref 3.5–5.0)
Anion gap: 12 (ref 5–15)
BILIRUBIN TOTAL: 0.7 mg/dL (ref 0.3–1.2)
BUN: 41 mg/dL — AB (ref 6–20)
CO2: 27 mmol/L (ref 22–32)
CREATININE: 2.1 mg/dL — AB (ref 0.44–1.00)
Calcium: 9 mg/dL (ref 8.9–10.3)
Chloride: 97 mmol/L — ABNORMAL LOW (ref 101–111)
GFR calc Af Amer: 25 mL/min — ABNORMAL LOW (ref >60)
GFR, EST NON AFRICAN AMERICAN: 22 mL/min — AB (ref >60)
Glucose, Bld: 167 mg/dL — ABNORMAL HIGH (ref 65–99)
Potassium: 4.6 mmol/L (ref 3.5–5.1)
Sodium: 136 mmol/L (ref 135–145)
Total Protein: 6.7 g/dL (ref 6.5–8.1)

## 2017-06-05 LAB — DIFFERENTIAL
BASOS ABS: 0 10*3/uL (ref 0.0–0.1)
Basophils Relative: 0 %
Eosinophils Absolute: 0 10*3/uL (ref 0.0–0.7)
Eosinophils Relative: 0 %
LYMPHS ABS: 1.2 10*3/uL (ref 0.7–4.0)
LYMPHS PCT: 8 %
Monocytes Absolute: 2.4 10*3/uL — ABNORMAL HIGH (ref 0.1–1.0)
Monocytes Relative: 15 %
NEUTROS PCT: 77 %
Neutro Abs: 11.9 10*3/uL — ABNORMAL HIGH (ref 1.7–7.7)

## 2017-06-05 LAB — CBC
HEMATOCRIT: 34.6 % — AB (ref 36.0–46.0)
Hemoglobin: 11.2 g/dL — ABNORMAL LOW (ref 12.0–15.0)
MCH: 32.7 pg (ref 26.0–34.0)
MCHC: 32.4 g/dL (ref 30.0–36.0)
MCV: 101.2 fL — ABNORMAL HIGH (ref 78.0–100.0)
Platelets: 192 10*3/uL (ref 150–400)
RBC: 3.42 MIL/uL — AB (ref 3.87–5.11)
RDW: 13.1 % (ref 11.5–15.5)
WBC: 15.5 10*3/uL — AB (ref 4.0–10.5)

## 2017-06-05 LAB — I-STAT CG4 LACTIC ACID, ED: Lactic Acid, Venous: 2.07 mmol/L (ref 0.5–1.9)

## 2017-06-05 MED ORDER — VANCOMYCIN HCL IN DEXTROSE 1-5 GM/200ML-% IV SOLN
1000.0000 mg | Freq: Once | INTRAVENOUS | Status: AC
  Administered 2017-06-05: 1000 mg via INTRAVENOUS
  Filled 2017-06-05: qty 200

## 2017-06-05 MED ORDER — SODIUM CHLORIDE 0.9 % IV BOLUS (SEPSIS)
2000.0000 mL | Freq: Once | INTRAVENOUS | Status: AC
  Administered 2017-06-05: 2000 mL via INTRAVENOUS

## 2017-06-05 MED ORDER — SODIUM CHLORIDE 0.9 % IV BOLUS (SEPSIS): 250.0000 mL | Freq: Once | INTRAVENOUS | Status: DC

## 2017-06-05 MED ORDER — SODIUM CHLORIDE 0.9 % IV BOLUS (SEPSIS)
1000.0000 mL | Freq: Once | INTRAVENOUS | Status: AC
  Administered 2017-06-05: 1000 mL via INTRAVENOUS

## 2017-06-05 MED ORDER — PIPERACILLIN-TAZOBACTAM 3.375 G IVPB 30 MIN
3.3750 g | Freq: Once | INTRAVENOUS | Status: AC
  Administered 2017-06-05: 3.375 g via INTRAVENOUS
  Filled 2017-06-05: qty 50

## 2017-06-05 MED ORDER — ACETAMINOPHEN 650 MG RE SUPP
650.0000 mg | Freq: Once | RECTAL | Status: AC
  Administered 2017-06-05: 650 mg via RECTAL
  Filled 2017-06-05: qty 1

## 2017-06-05 NOTE — ED Notes (Signed)
Nurse starting IV and will get 2nd set of blood cultures 

## 2017-06-05 NOTE — ED Triage Notes (Signed)
Pt transported from home by EMS, husband noted change in mental status @ 2000, husband states pt was not responding normally. Pt is alert, tremors noted, per EMS these are not normal for patient. VSS,

## 2017-06-05 NOTE — ED Notes (Signed)
Pt arrived via EMS, tremulous, very warm to touch. Not answering questions appropriately.  Per husband pt had appointment at breast center today,they returned home @ 1700, pt was normal at that time. @ 2000 husband states pt began having tremors and was not answer questions appropriately . Pt was too weak to walk per husband.  Pt found to have 103 rectal temp, 2nd IV established, BC x 2 drawn, IVF started, IN/out cath for urine completed.

## 2017-06-05 NOTE — ED Provider Notes (Addendum)
[facility] DEPT Provider Note   CSN: [id number] Arrival date & time: 06/05/17  2208     History   Chief Complaint Chief Complaint  Patient presents with  . Altered Mental Status    HPI [name] [name] is a 76 y.o. female.  The history is provided by the spouse. The history is limited by the condition of the patient (Altered mental status).  Has been noted onset about 8 PM of generalized shaking followed by confusion. She was not answering his questions. He is not aware of any cough, vomiting, diarrhea. She has chronic urinary frequency which is unchanged. He relates he did have a urinary tract infection when she was in the hospital in January, but has been off antibiotics for at least 6 months.  Past Medical History:  Diagnosis Date  . Anemia   . Arthritis    "a touch in my hands" (11/17/2016)  . Chronic back pain   . Chronic kidney disease (CKD), active medical management without dialysis, stage 3 (moderate)   . Depression   . GERD (gastroesophageal reflux disease)   . High cholesterol   . Hyperlipidemia   . Hypertension   . Seizures ([place])    last seizure 2014 (11/17/2016)  . Stroke [facility]) 2013   Secondary to acute right posterior temporo-occipital intra-axial hemorrhage  . Type II diabetes mellitus [facility])     Patient Active Problem List   Diagnosis Date Noted  . Partial symptomatic epilepsy with complex partial seizures, not intractable, without status epilepticus ([place])   . Bacteremia due to Escherichia coli 11/18/2016  . Renal insufficiency   . Aortic atherosclerosis ([place]) 11/15/2016  . UTI (urinary tract infection) 11/15/2016  . Paranoia ([place]) 05/24/2015  . Tachycardia 07/02/2013  . Hypophosphatemia 07/02/2013  . Encounter for central line placement 07/02/2013  . Hypokalemia 07/01/2013  . Hypomagnesemia 07/01/2013  . Protein-calorie malnutrition, severe ([place]) 06/29/2013  . Leukocytosis, unspecified 06/29/2013  . 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 TIA (transient  ischemic attack) 12/13/2012  . 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 Syncope 09/08/2012  . Acute respiratory failure with hypoxia ([place]) 08/20/2012  . Seizure disorder, grand mal ([place]) 08/20/2012  . Seizure ([place]) 08/20/2012  . CKD (chronic kidney disease) stage 3, GFR 30-59 ml/min 08/19/2012  . Leukocytosis 08/19/2012  . Hemorrhage in the brain-13 x 22 x 14 mm right posterior temporo-occipital intra-axial acute 08/19/2012  . Left hip pain 08/19/2012  . Left hemiparesis ([place]) 08/19/2012  . Hemianopia, homonymous, left 08/19/2012  . Hemisensory deficit, left 08/19/2012  . Nausea 08/19/2012  . Headache(784.0) 08/19/2012  . Encephalopathy acute 08/19/2012  . Hemorrhage of brain, nontraumatic ([place]) 08/18/2012  . Dysphagia 08/18/2012  . HTN (hypertension) 08/18/2012  . DM type 2 (diabetes mellitus, type 2) ([place]) 07/26/2012  . Hypoglycemia due to insulin 07/26/2012  . GERD (gastroesophageal reflux disease)     Past Surgical History:  Procedure Laterality Date  . CATARACT EXTRACTION W/ INTRAOCULAR LENS IMPLANT Bilateral   . COLONOSCOPY    . DILATION AND CURETTAGE OF UTERUS  X 3  . 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 Authorizing Provider  acetaminophen (TYLENOL) 325 MG  tablet Take 650 mg by mouth every 6 (six) hours as needed for moderate pain or headache.     [provider]  aspirin 81 MG tablet Take 81 mg by mouth daily.    [provider]  atorvastatin (LIPITOR) 10 MG tablet Take 10 mg by mouth daily.  02/11/13   [provider]  diltiazem (CARDIZEM) 90 MG tablet Take 90 mg by mouth 2 (two) times daily.  03/17/15   [provider]    divalproex (DEPAKOTE ER) 250 MG 24 hr tablet Take 3 tablets (750 mg total) by mouth daily. 12/23/16   Garvin Fila, MD  fluocinonide cream (LIDEX) 6.07 % Apply 1 application topically daily. 10/30/16   [provider]  furosemide (LASIX) 20 MG tablet Take 20 mg by mouth daily.  08/06/16   [provider]  hydrOXYzine (ATARAX/VISTARIL) 25 MG tablet TAKE 1 TABLET BY MOUTH AT BEDTIME AS NEEDED FOR ITCHING 04/25/15   [provider]  LEVEMIR FLEXTOUCH 100 UNIT/ML Pen INJECT 10 UNITS SUBCUTANEOUSLY DAILY AT NIGHT 05/14/15   [provider]  Magnesium 500 MG TABS Take 1 tablet (500 mg total) by mouth 3 (three) times daily. Patient taking differently: Take 500 mg by mouth 2 (two) times daily.  07/08/13   Nita Sells, MD  metoprolol tartrate (LOPRESSOR) 25 MG tablet Take 25 mg by mouth 2 (two) times daily.    [provider]  Multiple Vitamin (MULTIVITAMIN) capsule Take 1 capsule by mouth daily.    [provider]  NOVOLOG FLEXPEN 100 UNIT/ML SOPN FlexPen Inject 4-9 Units into the skin 3 (three) times daily with meals. Per sliding scale, 4 units if over 200 = 5 units in the morning and at lunch and 8 units if over 200 = 9 units. 07/29/13   [provider]  omeprazole (PRILOSEC) 20 MG capsule Take 20 mg by mouth daily. 03/17/15   [provider]  vitamin C (ASCORBIC ACID) 500 MG tablet Take 500 mg by mouth daily.    [provider]    Family History Family History  Problem Relation Age of Onset  . Cancer Mother   . Colon cancer Mother   . Liver disease Mother   . Breast cancer Mother   . Cancer Father   . Diabetes Paternal Grandmother   . Breast cancer Maternal Aunt   . Breast cancer Maternal Aunt   . Esophageal cancer Neg Hx   . Rectal cancer Neg Hx   . Stomach cancer Neg Hx     Social History Social History  Substance Use Topics  . Smoking status: Never Smoker  . Smokeless tobacco: Never Used  . Alcohol  use No     Comment: patient drinks caffeinated drinks.      Allergies   Barbiturates and Latex   Review of Systems Review of Systems  Unable to perform ROS: Mental status change     Physical Exam Updated Vital Signs BP (!) 177/115 (BP Location: Right Arm)   Pulse (!) 115   Temp (!) 103 F (39.4 C) (Rectal)   Resp (!) 22   Ht 5\' 5"  (1.651 m)   Wt 66.7 kg (147 lb)   SpO2 96%   BMI 24.46 kg/m   Physical Exam  Nursing note and vitals reviewed.  76 year old female, resting comfortably and in no acute distress. Vital signs are significant for tachycardia, hypertension, fever, tachypnea. Oxygen saturation is 96%, which is normal. Head is normocephalic and atraumatic. PERRLA,  EOMI. Oropharynx is clear. Neck is nontender and supple without adenopathy or JVD. Back is nontender and there is no CVA tenderness. Lungs are clear without rales, wheezes, or rhonchi. Chest is nontender. Heart has regular rate and rhythm without murmur. Abdomen is soft, flat, nontender without masses or hepatosplenomegaly and peristalsis is normoactive. Extremities have no cyanosis or edema, full range of motion is present. Skin is warm and dry without rash. Neurologic: She is awake and alert but will not answer questions. She will follow some commands, cranial nerves are intact, there are no motor or sensory deficits.  ED Treatments / Results  Labs (all labs ordered are listed, but only abnormal results are displayed) Labs Reviewed  COMPREHENSIVE METABOLIC PANEL - Abnormal; Notable for the following:       Result Value   Chloride 97 (*)    Glucose, Bld 167 (*)    BUN 41 (*)    Creatinine, Ser 2.10 (*)    Albumin 3.1 (*)    ALT 13 (*)    GFR calc non Af Amer 22 (*)    GFR calc Af Amer 25 (*)    All other components within normal limits  CBC - Abnormal; Notable for the following:    WBC 15.5 (*)    RBC 3.42 (*)    Hemoglobin 11.2 (*)    HCT 34.6 (*)    MCV 101.2 (*)    All other components  within normal limits  URINALYSIS, ROUTINE W REFLEX MICROSCOPIC - Abnormal; Notable for the following:    Color, Urine STRAW (*)    Protein, ur 30 (*)    All other components within normal limits  I-STAT CG4 LACTIC ACID, ED - Abnormal; Notable for the following:    Lactic Acid, Venous 2.07 (*)    All other components within normal limits  CULTURE, BLOOD (ROUTINE X 2)  CULTURE, BLOOD (ROUTINE X 2)  URINE CULTURE  PROTIME-INR  DIFFERENTIAL  CBG MONITORING, ED  I-STAT CG4 LACTIC ACID, ED     EKG Interpretation  Date/Time:  Friday June 05 2017 22:20:47 EDT Ventricular Rate:  113 PR Interval:    QRS Duration: 125 QT Interval:  336 QTC Calculation: 461 R Axis:   80 Text Interpretation:  Sinus tachycardia Right bundle branch block When compared with ECG of 11/15/2016, No significant change was found Confirmed by Delora Fuel ([id number]) on 06/05/2017 11:53:12 PM       Radiology Dg Chest 2 View  Result Date: 06/05/2017 CLINICAL DATA:  Fever, weakness for 2 days. History of stroke, hypertension, diabetes. EXAM: CHEST  2 VIEW COMPARISON:  Chest x-ray dated 11/15/2016. FINDINGS: Heart size and mediastinal contours are within normal limits. Atherosclerotic changes noted at the aortic arch. Lungs are clear. No pleural effusion or pneumothorax seen. No acute or suspicious osseous finding. Calcifications about both shoulders suggests chronic calcific tendinopathy. IMPRESSION: No active cardiopulmonary disease. No evidence of pneumonia or pulmonary edema. Aortic atherosclerosis. Electronically Signed   By: Franki Cabot M.D.   On: 06/05/2017 23:39   Procedures Procedures (including critical care time) CRITICAL CARE Performed by: XTGGY,IRSWN Total critical care time: 45 minutes Critical care time was exclusive of separately billable procedures and treating other patients. Critical care was necessary to treat or prevent imminent or life-threatening deterioration. Critical care was time spent  personally by me on the following activities: development of treatment plan with patient and/or surrogate as well as nursing, discussions with consultants, evaluation of patient's response to treatment, examination of patient,  obtaining history from patient or surrogate, ordering and performing treatments and interventions, ordering and review of laboratory studies, ordering and review of radiographic studies, pulse oximetry and re-evaluation of patient's condition.  Medications Ordered in ED Medications  vancomycin (VANCOCIN) IVPB 1000 mg/200 mL premix (not administered)  piperacillin-tazobactam (ZOSYN) IVPB 3.375 g (3.375 g Intravenous New Bag/Given 06/05/17 2252)  acetaminophen (TYLENOL) suppository 650 mg (650 mg Rectal Given 06/05/17 2238)  sodium chloride 0.9 % bolus 2,000 mL (2,000 mLs Intravenous New Bag/Given 06/05/17 2252)     Initial Impression / Assessment and Plan / ED Course  I have reviewed the triage vital signs and the nursing notes.  Pertinent labs & imaging results that were available during my care of the patient were reviewed by me and considered in my medical decision making (see chart for details).  Fever with altered mental status and tachycardia consistent with sepsis. No hypotension, and only mild tachycardia. Old records are reviewed, and she does have prior hospitalization for altered mental status secondary to urinary tract infection. Sepsis protocol was initiated. Initial lactic acid is only minimally elevated at 2.07. However, with tachycardia and, it is elected to initiate early goal-directed fluid therapy. She started on antibiotics for sepsis of unknown source.  Urinalysis does not show clear evidence of infection-only 6-30 WBCs per high-power field, and no bacteria seen. Chest x-ray shows no clear sign of pneumonia. She does have a mild acute kidney injury which may be due to dehydration. With no clear source of infection, consider possibility of bacterial infection  with fever. However, she needs to be continued on sepsis pathway until cultures have come back. Case is discussed with Dr. Myna Hidalgo of [facility] who agrees to admit the patient.  Final Clinical Impressions(s) / ED Diagnoses   Final diagnoses:  Sepsis, due to unspecified organism ([place])  Altered mental status, unspecified altered mental status type  Acute kidney injury (nontraumatic) ([place])  Macrocytic anemia    New Prescriptions New Prescriptions   No medications on file     Delora Fuel, MD 27/03/50 0938    Delora Fuel, MD 18/29/93 2354

## 2017-06-06 ENCOUNTER — Inpatient Hospital Stay (HOSPITAL_COMMUNITY): Payer: Medicare Other

## 2017-06-06 DIAGNOSIS — I69119 Unspecified symptoms and signs involving cognitive functions following nontraumatic intracerebral hemorrhage: Secondary | ICD-10-CM

## 2017-06-06 DIAGNOSIS — A419 Sepsis, unspecified organism: Secondary | ICD-10-CM

## 2017-06-06 DIAGNOSIS — K219 Gastro-esophageal reflux disease without esophagitis: Secondary | ICD-10-CM

## 2017-06-06 DIAGNOSIS — E119 Type 2 diabetes mellitus without complications: Secondary | ICD-10-CM

## 2017-06-06 DIAGNOSIS — Z794 Long term (current) use of insulin: Secondary | ICD-10-CM

## 2017-06-06 DIAGNOSIS — G40409 Other generalized epilepsy and epileptic syndromes, not intractable, without status epilepticus: Secondary | ICD-10-CM

## 2017-06-06 LAB — GLUCOSE, CAPILLARY
GLUCOSE-CAPILLARY: 154 mg/dL — AB (ref 65–99)
GLUCOSE-CAPILLARY: 221 mg/dL — AB (ref 65–99)
GLUCOSE-CAPILLARY: 260 mg/dL — AB (ref 65–99)
Glucose-Capillary: 103 mg/dL — ABNORMAL HIGH (ref 65–99)
Glucose-Capillary: 155 mg/dL — ABNORMAL HIGH (ref 65–99)

## 2017-06-06 LAB — BLOOD CULTURE ID PANEL (REFLEXED)
Acinetobacter baumannii: NOT DETECTED
CANDIDA PARAPSILOSIS: NOT DETECTED
CARBAPENEM RESISTANCE: NOT DETECTED
Candida albicans: NOT DETECTED
Candida glabrata: NOT DETECTED
Candida krusei: NOT DETECTED
Candida tropicalis: NOT DETECTED
ENTEROBACTERIACEAE SPECIES: DETECTED — AB
ENTEROCOCCUS SPECIES: NOT DETECTED
Enterobacter cloacae complex: NOT DETECTED
Escherichia coli: DETECTED — AB
HAEMOPHILUS INFLUENZAE: NOT DETECTED
KLEBSIELLA PNEUMONIAE: NOT DETECTED
Klebsiella oxytoca: NOT DETECTED
Listeria monocytogenes: NOT DETECTED
NEISSERIA MENINGITIDIS: NOT DETECTED
PSEUDOMONAS AERUGINOSA: NOT DETECTED
Proteus species: NOT DETECTED
STAPHYLOCOCCUS AUREUS BCID: NOT DETECTED
STREPTOCOCCUS AGALACTIAE: NOT DETECTED
STREPTOCOCCUS PYOGENES: NOT DETECTED
STREPTOCOCCUS SPECIES: NOT DETECTED
Serratia marcescens: NOT DETECTED
Staphylococcus species: NOT DETECTED
Streptococcus pneumoniae: NOT DETECTED

## 2017-06-06 LAB — CBC WITH DIFFERENTIAL/PLATELET
BASOS ABS: 0 10*3/uL (ref 0.0–0.1)
BASOS PCT: 0 %
EOS ABS: 0 10*3/uL (ref 0.0–0.7)
Eosinophils Relative: 0 %
HEMATOCRIT: 32.7 % — AB (ref 36.0–46.0)
HEMOGLOBIN: 10.5 g/dL — AB (ref 12.0–15.0)
Lymphocytes Relative: 12 %
Lymphs Abs: 2.2 10*3/uL (ref 0.7–4.0)
MCH: 32.3 pg (ref 26.0–34.0)
MCHC: 32.1 g/dL (ref 30.0–36.0)
MCV: 100.6 fL — ABNORMAL HIGH (ref 78.0–100.0)
Monocytes Absolute: 3.3 10*3/uL — ABNORMAL HIGH (ref 0.1–1.0)
Monocytes Relative: 19 %
NEUTROS ABS: 12.1 10*3/uL — AB (ref 1.7–7.7)
NEUTROS PCT: 69 %
Platelets: 162 10*3/uL (ref 150–400)
RBC: 3.25 MIL/uL — AB (ref 3.87–5.11)
RDW: 12.9 % (ref 11.5–15.5)
WBC: 17.6 10*3/uL — AB (ref 4.0–10.5)

## 2017-06-06 LAB — BASIC METABOLIC PANEL
Anion gap: 9 (ref 5–15)
BUN: 37 mg/dL — ABNORMAL HIGH (ref 6–20)
CALCIUM: 7.8 mg/dL — AB (ref 8.9–10.3)
CO2: 25 mmol/L (ref 22–32)
CREATININE: 1.88 mg/dL — AB (ref 0.44–1.00)
Chloride: 105 mmol/L (ref 101–111)
GFR calc non Af Amer: 25 mL/min — ABNORMAL LOW (ref >60)
GFR, EST AFRICAN AMERICAN: 29 mL/min — AB (ref >60)
Glucose, Bld: 239 mg/dL — ABNORMAL HIGH (ref 65–99)
Potassium: 4.4 mmol/L (ref 3.5–5.1)
SODIUM: 139 mmol/L (ref 135–145)

## 2017-06-06 LAB — LACTIC ACID, PLASMA: Lactic Acid, Venous: 1.3 mmol/L (ref 0.5–1.9)

## 2017-06-06 LAB — PROCALCITONIN: PROCALCITONIN: 2.48 ng/mL

## 2017-06-06 MED ORDER — METOPROLOL TARTRATE 25 MG PO TABS: 25.0000 mg | ORAL_TABLET | Freq: Two times a day (BID) | ORAL | Status: DC

## 2017-06-06 MED ORDER — ACETAMINOPHEN 650 MG RE SUPP: 650.0000 mg | Freq: Four times a day (QID) | RECTAL | Status: DC | PRN

## 2017-06-06 MED ORDER — HYDRALAZINE HCL 20 MG/ML IJ SOLN: 10.0000 mg | INTRAMUSCULAR | Status: DC | PRN

## 2017-06-06 MED ORDER — MAGNESIUM OXIDE 400 (241.3 MG) MG PO TABS
400.0000 mg | ORAL_TABLET | Freq: Two times a day (BID) | ORAL | Status: DC
  Administered 2017-06-06 – 2017-06-11 (×11): 400 mg via ORAL
  Filled 2017-06-06 (×11): qty 1

## 2017-06-06 MED ORDER — ASPIRIN EC 81 MG PO TBEC
81.0000 mg | DELAYED_RELEASE_TABLET | Freq: Every day | ORAL | Status: DC
  Administered 2017-06-06 – 2017-06-11 (×6): 81 mg via ORAL
  Filled 2017-06-06 (×6): qty 1

## 2017-06-06 MED ORDER — SODIUM CHLORIDE 0.9 % IV SOLN
INTRAVENOUS | Status: AC
  Administered 2017-06-06: 05:00:00 via INTRAVENOUS

## 2017-06-06 MED ORDER — HEPARIN SODIUM (PORCINE) 5000 UNIT/ML IJ SOLN
5000.0000 [IU] | Freq: Three times a day (TID) | INTRAMUSCULAR | Status: DC
  Administered 2017-06-06 – 2017-06-11 (×16): 5000 [IU] via SUBCUTANEOUS
  Filled 2017-06-06 (×12): qty 1

## 2017-06-06 MED ORDER — ONDANSETRON HCL 4 MG/2ML IJ SOLN: 4.0000 mg | Freq: Four times a day (QID) | INTRAMUSCULAR | Status: DC | PRN

## 2017-06-06 MED ORDER — INSULIN ASPART 100 UNIT/ML ~~LOC~~ SOLN
0.0000 [IU] | SUBCUTANEOUS | Status: DC
  Administered 2017-06-06: 3 [IU] via SUBCUTANEOUS
  Administered 2017-06-06: 8 [IU] via SUBCUTANEOUS
  Administered 2017-06-06: 3 [IU] via SUBCUTANEOUS
  Administered 2017-06-06: 2 [IU] via SUBCUTANEOUS
  Administered 2017-06-07: 5 [IU] via SUBCUTANEOUS
  Administered 2017-06-07 (×2): 3 [IU] via SUBCUTANEOUS
  Administered 2017-06-07: 11 [IU] via SUBCUTANEOUS
  Administered 2017-06-07 – 2017-06-08 (×3): 5 [IU] via SUBCUTANEOUS
  Administered 2017-06-08: 8 [IU] via SUBCUTANEOUS
  Administered 2017-06-08: 2 [IU] via SUBCUTANEOUS
  Administered 2017-06-08: 3 [IU] via SUBCUTANEOUS
  Administered 2017-06-08: 5 [IU] via SUBCUTANEOUS
  Administered 2017-06-09: 8 [IU] via SUBCUTANEOUS
  Administered 2017-06-09: 5 [IU] via SUBCUTANEOUS
  Administered 2017-06-09: 8 [IU] via SUBCUTANEOUS
  Administered 2017-06-09: 5 [IU] via SUBCUTANEOUS
  Administered 2017-06-10: 3 [IU] via SUBCUTANEOUS
  Administered 2017-06-10: 2 [IU] via SUBCUTANEOUS
  Administered 2017-06-10: 3 [IU] via SUBCUTANEOUS
  Administered 2017-06-10: 5 [IU] via SUBCUTANEOUS

## 2017-06-06 MED ORDER — VANCOMYCIN HCL 500 MG IV SOLR: 500.0000 mg | INTRAVENOUS | Status: DC

## 2017-06-06 MED ORDER — DEXTROSE 5 % IV SOLN
2.0000 g | INTRAVENOUS | Status: DC
  Administered 2017-06-06 – 2017-06-07 (×2): 2 g via INTRAVENOUS
  Filled 2017-06-06 (×3): qty 2

## 2017-06-06 MED ORDER — DILTIAZEM HCL 90 MG PO TABS
90.0000 mg | ORAL_TABLET | Freq: Two times a day (BID) | ORAL | Status: DC
  Administered 2017-06-06 – 2017-06-09 (×7): 90 mg via ORAL
  Filled 2017-06-06: qty 1
  Filled 2017-06-06 (×4): qty 3
  Filled 2017-06-06: qty 1
  Filled 2017-06-06 (×3): qty 3
  Filled 2017-06-06 (×5): qty 1

## 2017-06-06 MED ORDER — ADULT MULTIVITAMIN W/MINERALS CH
1.0000 | ORAL_TABLET | Freq: Every day | ORAL | Status: DC
  Administered 2017-06-06 – 2017-06-11 (×6): 1 via ORAL
  Filled 2017-06-06 (×6): qty 1

## 2017-06-06 MED ORDER — MORPHINE SULFATE (PF) 4 MG/ML IV SOLN: 1.0000 mg | INTRAVENOUS | Status: DC | PRN

## 2017-06-06 MED ORDER — ATORVASTATIN CALCIUM 10 MG PO TABS
10.0000 mg | ORAL_TABLET | Freq: Every day | ORAL | Status: DC
  Administered 2017-06-06 – 2017-06-10 (×5): 10 mg via ORAL
  Filled 2017-06-06 (×5): qty 1

## 2017-06-06 MED ORDER — ONDANSETRON HCL 4 MG PO TABS: 4.0000 mg | ORAL_TABLET | Freq: Four times a day (QID) | ORAL | Status: DC | PRN

## 2017-06-06 MED ORDER — DIVALPROEX SODIUM ER 500 MG PO TB24
750.0000 mg | ORAL_TABLET | Freq: Every day | ORAL | Status: DC
  Administered 2017-06-06 – 2017-06-11 (×6): 750 mg via ORAL
  Filled 2017-06-06 (×6): qty 1

## 2017-06-06 MED ORDER — PANTOPRAZOLE SODIUM 40 MG PO TBEC
40.0000 mg | DELAYED_RELEASE_TABLET | Freq: Every day | ORAL | Status: DC
  Administered 2017-06-06 – 2017-06-11 (×6): 40 mg via ORAL
  Filled 2017-06-06 (×6): qty 1

## 2017-06-06 MED ORDER — ACETAMINOPHEN 325 MG PO TABS
650.0000 mg | ORAL_TABLET | Freq: Four times a day (QID) | ORAL | Status: DC | PRN
  Administered 2017-06-09 – 2017-06-11 (×2): 650 mg via ORAL
  Filled 2017-06-06 (×2): qty 2

## 2017-06-06 MED ORDER — METOPROLOL TARTRATE 25 MG PO TABS
25.0000 mg | ORAL_TABLET | Freq: Two times a day (BID) | ORAL | Status: DC
  Administered 2017-06-06 – 2017-06-09 (×7): 25 mg via ORAL
  Filled 2017-06-06 (×7): qty 1

## 2017-06-06 MED ORDER — PIPERACILLIN-TAZOBACTAM 3.375 G IVPB
3.3750 g | Freq: Three times a day (TID) | INTRAVENOUS | Status: DC
  Administered 2017-06-06 (×2): 3.375 g via INTRAVENOUS
  Filled 2017-06-06 (×2): qty 50

## 2017-06-06 NOTE — Progress Notes (Signed)
PHARMACY - PHYSICIAN COMMUNICATION CRITICAL VALUE ALERT - BLOOD CULTURE IDENTIFICATION (BCID)  Results for orders placed or performed during the hospital encounter of 06/05/17  Blood Culture ID Panel (Reflexed) (Collected: 06/05/2017 10:35 PM)  Result Value Ref Range   Enterococcus species NOT DETECTED NOT DETECTED   Listeria monocytogenes NOT DETECTED NOT DETECTED   Staphylococcus species NOT DETECTED NOT DETECTED   Staphylococcus aureus NOT DETECTED NOT DETECTED   Streptococcus species NOT DETECTED NOT DETECTED   Streptococcus agalactiae NOT DETECTED NOT DETECTED   Streptococcus pneumoniae NOT DETECTED NOT DETECTED   Streptococcus pyogenes NOT DETECTED NOT DETECTED   Acinetobacter baumannii NOT DETECTED NOT DETECTED   Enterobacteriaceae species DETECTED (A) NOT DETECTED   Enterobacter cloacae complex NOT DETECTED NOT DETECTED   Escherichia coli DETECTED (A) NOT DETECTED   Klebsiella oxytoca NOT DETECTED NOT DETECTED   Klebsiella pneumoniae NOT DETECTED NOT DETECTED   Proteus species NOT DETECTED NOT DETECTED   Serratia marcescens NOT DETECTED NOT DETECTED   Carbapenem resistance NOT DETECTED NOT DETECTED   Haemophilus influenzae NOT DETECTED NOT DETECTED   Neisseria meningitidis NOT DETECTED NOT DETECTED   Pseudomonas aeruginosa NOT DETECTED NOT DETECTED   Candida albicans NOT DETECTED NOT DETECTED   Candida glabrata NOT DETECTED NOT DETECTED   Candida krusei NOT DETECTED NOT DETECTED   Candida parapsilosis NOT DETECTED NOT DETECTED   Candida tropicalis NOT DETECTED NOT DETECTED    Name of physician (or Provider) Contacted: Osei-Bonsu via text page  Changes to prescribed antibiotics required: Currently on zosyn which will cover but consider narrowing to ceftriaxone 2gm IV Q24H  Coltan Spinello, Rande Lawman 06/06/2017  11:51 AM

## 2017-06-06 NOTE — H&P (Signed)
History and Physical    [name] [name] JEH:[id number] DOB: [birth year] DOA: 06/05/2017  PCP: [name], MD   Patient coming from: Home  Chief Complaint: Fevers, confusion   HPI: [name] [name] is a 76 y.o. female with medical history significant for chronic kidney disease stage III, insulin-dependent diabetes mellitus, hypertension, GERD, and history of stroke with motor and cognitive deficits, now presenting to the emergency department with chills and confusion. Patient is accompanied by her husband who assists with history. She reportedly been in her usual state until earlier this evening when, after complaining of chills, she was noted to be confused, having difficulty with basic questioning. There was no recent fall or trauma, no recent cough or dyspnea, and no abdominal pain, nausea, vomiting, or diarrhea. Patient reports chronic urinary frequency, but no dysuria. Denies flank pain. There has not been any change in vision or hearing or focal numbness or weakness.  ED Course: Upon arrival to the ED, patient is found to be febrile to 39.4 C, saturating well on room air, tachycardic in the 110s, and with vitals otherwise stable. EKG features a sinus tachycardia with rate 113 and a chronic right bundle branch block. Chest x-ray is negative for acute cardiopulmonary disease. Chemistry panel reveals a BUN of 41 and creatinine of 2.10, up from 1.7 in January. CBC is notable for a leukocytosis to 15,500 and a stable anemia with hemoglobin of 11.2. Urinalysis is unremarkable, INR is normal, and lactic acid is slightly elevated to 2.07. Blood and urine cultures were collected in the ED, 30 cc/kg bolus of NS was given, fever was treated with rectal APAP, and she was started on empiric vancomycin and Zosyn. Tachycardia improved with the IV fluids and patient's mental status also began to improve in the ED. She will be admitted to the medical-surgical unit for ongoing evaluation and management of SIRS with acute  encephalopathy and AKI.  Review of Systems:  All other systems reviewed and apart from HPI, are negative.  Past Medical History:  Diagnosis Date  . Anemia   . Arthritis    "a touch in my hands" (11/17/2016)  . Chronic back pain   . Chronic kidney disease (CKD), active medical management without dialysis, stage 3 (moderate)   . Depression   . GERD (gastroesophageal reflux disease)   . High cholesterol   . Hyperlipidemia   . Hypertension   . Seizures ([place])    last seizure 2014 (11/17/2016)  . Stroke [facility]) 2013   Secondary to acute right posterior temporo-occipital intra-axial hemorrhage  . Type II diabetes mellitus ([place])     Past Surgical History:  Procedure Laterality Date  . CATARACT EXTRACTION W/ INTRAOCULAR LENS IMPLANT Bilateral   . COLONOSCOPY    . DILATION AND CURETTAGE OF UTERUS  X 3  . 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 She has never used smokeless tobacco. She reports that she does not drink alcohol or use drugs.  Allergies  Allergen Reactions  . Barbiturates     Becomes restless.  Also with all "strong" medications like sleeping pills  . Latex Itching    Family History  Problem Relation Age of Onset  . Cancer Mother   . Colon cancer Mother   . Liver disease Mother   . Breast cancer Mother   . Cancer  Father   . Diabetes Paternal Grandmother   . Breast cancer Maternal Aunt   . Breast cancer Maternal Aunt   . Esophageal cancer Neg Hx   . Rectal cancer Neg Hx   . Stomach cancer Neg Hx      Prior to Admission medications   Medication Sig Start Date End Date Taking? Authorizing Provider  acetaminophen (TYLENOL) 325 MG tablet Take 650 mg by mouth every 6 (six) hours as needed for moderate pain or headache.    Yes [provider]  aspirin 81 MG tablet Take 81 mg by  mouth daily.   Yes [provider]  atorvastatin (LIPITOR) 10 MG tablet Take 10 mg by mouth daily.  02/11/13  Yes [provider]  diltiazem (CARDIZEM) 90 MG tablet Take 90 mg by mouth 2 (two) times daily.  03/17/15  Yes [provider]  divalproex (DEPAKOTE ER) 250 MG 24 hr tablet Take 3 tablets (750 mg total) by mouth daily. 12/23/16  Yes Garvin Fila, MD  furosemide (LASIX) 20 MG tablet Take 20 mg by mouth daily.  08/06/16  Yes [provider]  hydrOXYzine (ATARAX/VISTARIL) 25 MG tablet TAKE 1 TABLET BY MOUTH AT BEDTIME AS NEEDED FOR ITCHING 04/25/15  Yes [provider]  LEVEMIR FLEXTOUCH 100 UNIT/ML Pen INJECT 8 UNITS SUBCUTANEOUSLY DAILY AT NIGHT 05/14/15  Yes [provider]  Magnesium 500 MG TABS Take 1 tablet (500 mg total) by mouth 3 (three) times daily. Patient taking differently: Take 500 mg by mouth 2 (two) times daily.  07/08/13  Yes Nita Sells, MD  metoprolol tartrate (LOPRESSOR) 25 MG tablet Take 25 mg by mouth 2 (two) times daily.   Yes [provider]  Multiple Vitamin (MULTIVITAMIN) capsule Take 1 capsule by mouth daily.   Yes [provider]  NOVOLOG FLEXPEN 100 UNIT/ML SOPN FlexPen Inject 4-9 Units into the skin 3 (three) times daily with meals. Per sliding scale, 4 units if over 200 = 5 units in the morning and at lunch and 8 units if over 200 = 9 units. 07/29/13  Yes [provider]  omeprazole (PRILOSEC) 20 MG capsule Take 20 mg by mouth every morning.  03/17/15  Yes [provider]  vitamin C (ASCORBIC ACID) 500 MG tablet Take 500 mg by mouth daily.   Yes [provider]    Physical Exam: Vitals:   06/05/17 2232 06/05/17 2233 06/05/17 2300 06/05/17 2333  BP: (!) 177/115  134/66 (!) 128/102  Pulse: (!) 115  (!) 113 (!) 108  Resp: (!) 22  20 (!) 26  Temp: (!) 103 F (39.4 C)     TempSrc: Rectal     SpO2: 96%  95% 96%  Weight:  66.7 kg (147 lb)    Height:  5\' 5"   (1.651 m)        Constitutional: No respiratory distress, calm, no pallor, no diaphoresis.  Eyes: PERTLA, lids and conjunctivae normal ENMT: Mucous membranes are moist. Posterior pharynx clear of any exudate or lesions.   Neck: normal, supple, no masses, no thyromegaly Respiratory: clear to auscultation bilaterally, no wheezing, no crackles. Normal respiratory effort.   Cardiovascular: Rate ~110 and regular. No significant JVD. Abdomen: No distension, no tenderness, no masses palpated. Bowel sounds active.  Musculoskeletal: no clubbing / cyanosis. No joint deformity upper and lower extremities.   Skin: no significant rashes, lesions, ulcers. Warm, dry, well-perfused. Neurologic: CN 2-12 grossly intact. Sensation intact, fine resting tremor to bilateral UE's.  Psychiatric: Alert  and oriented to person and place, but not oriented to month or year. Pleasant and cooperative.     Labs on Admission: I have personally reviewed following labs and imaging studies  CBC:  Recent Labs Lab 06/05/17 2234 06/05/17 2320  WBC 15.5*  --   NEUTROABS  --  11.9*  HGB 11.2*  --   HCT 34.6*  --   MCV 101.2*  --   PLT 192  --    Basic Metabolic Panel:  Recent Labs Lab 06/05/17 2234  NA 136  K 4.6  CL 97*  CO2 27  GLUCOSE 167*  BUN 41*  CREATININE 2.10*  CALCIUM 9.0   GFR: Estimated Creatinine Clearance: 20.5 mL/min (A) (by C-G formula based on SCr of 2.1 mg/dL (H)). Liver Function Tests:  Recent Labs Lab 06/05/17 2234  AST 27  ALT 13*  ALKPHOS 116  BILITOT 0.7  PROT 6.7  ALBUMIN 3.1*   No results for input(s): LIPASE, AMYLASE in the last 168 hours. No results for input(s): AMMONIA in the last 168 hours. Coagulation Profile:  Recent Labs Lab 06/05/17 2234  INR 0.90   Cardiac Enzymes: No results for input(s): CKTOTAL, CKMB, CKMBINDEX, TROPONINI in the last 168 hours. BNP (last 3 results) No results for input(s): PROBNP in the last 8760 hours. HbA1C: No results for  input(s): HGBA1C in the last 72 hours. CBG: No results for input(s): GLUCAP in the last 168 hours. Lipid Profile: No results for input(s): CHOL, HDL, LDLCALC, TRIG, CHOLHDL, LDLDIRECT in the last 72 hours. Thyroid Function Tests: No results for input(s): TSH, T4TOTAL, FREET4, T3FREE, THYROIDAB in the last 72 hours. Anemia Panel: No results for input(s): VITAMINB12, FOLATE, FERRITIN, TIBC, IRON, RETICCTPCT in the last 72 hours. Urine analysis:    Component Value Date/Time   COLORURINE STRAW (A) 06/05/2017 2301   APPEARANCEUR CLEAR 06/05/2017 2301   LABSPEC 1.011 06/05/2017 2301   PHURINE 8.0 06/05/2017 2301   GLUCOSEU NEGATIVE 06/05/2017 2301   HGBUR NEGATIVE 06/05/2017 2301   BILIRUBINUR NEGATIVE 06/05/2017 2301   BILIRUBINUR neg 08/16/2012 1531   KETONESUR NEGATIVE 06/05/2017 2301   PROTEINUR 30 (A) 06/05/2017 2301   UROBILINOGEN 0.2 06/25/2013 1002   NITRITE NEGATIVE 06/05/2017 2301   LEUKOCYTESUR NEGATIVE 06/05/2017 2301   Sepsis Labs: @LABRCNTIP (procalcitonin:4,lacticidven:4) )No results found for this or any previous visit (from the past 240 hour(s)).   Radiological Exams on Admission: Dg Chest 2 View  Result Date: 06/05/2017 CLINICAL DATA:  Fever, weakness for 2 days. History of stroke, hypertension, diabetes. EXAM: CHEST  2 VIEW COMPARISON:  Chest x-ray dated 11/15/2016. FINDINGS: Heart size and mediastinal contours are within normal limits. Atherosclerotic changes noted at the aortic arch. Lungs are clear. No pleural effusion or pneumothorax seen. No acute or suspicious osseous finding. Calcifications about both shoulders suggests chronic calcific tendinopathy. IMPRESSION: No active cardiopulmonary disease. No evidence of pneumonia or pulmonary edema. Aortic atherosclerosis. Electronically Signed   By: Franki Cabot M.D.   On: 06/05/2017 23:39   US Breast Ltd Uni Left Inc Axilla  Result Date: 06/05/2017 CLINICAL DATA:  Recall from screening mammography with tomosynthesis,  possible mass or focal asymmetry in the upper inner subareolar left breast. EXAM: 2D DIGITAL DIAGNOSTIC LEFT MAMMOGRAM WITH ADJUNCT TOMO ULTRASOUND LEFT BREAST COMPARISON:  Mammography 06/01/2017, 05/29/2016 and earlier. Left breast ultrasound 03/24/2011 was performed in a different part of the breast. ACR Breast Density Category d: The breast tissue is extremely dense, which lowers the sensitivity of mammography. FINDINGS: Standard and tomosynthesis spot  compression CC and MLO views of the area of concern in the left breast were obtained. The spot compression MLO views demonstrates a circumscribed oval medium density mass measuring approximately 6 mm without associated architectural distortion or suspicious calcification. This is not visualized on the spot compression CC view. On physical exam, there is no palpable abnormality in the upper inner periareolar left breast. Targeted left breast ultrasound is performed, showing a possible intraductal mass at the 10 o'clock position approximately 1 cm from the nipple measuring approximately 3 x 7 x 7 mm. There is no internal power Doppler flow. Sonographic evaluation the left axilla demonstrates no pathologic lymphadenopathy. IMPRESSION: 1. Approximate 7 mm possible intraductal mass in the upper inner subareolar left breast which accounts for the area of concern on screening mammography. 2. No pathologic left axillary lymphadenopathy. RECOMMENDATION: Ultrasound-guided core needle biopsy of the possible intraductal mass in the left breast in order to exclude a high risk papilloma or DCIS. The ultrasound biopsy procedure was discussed with the patient and her questions were answered. She has agreed to proceed and the biopsy has been scheduled for Friday, August 10 at 3:45 p.m. I have discussed the findings and recommendations with the patient. Results were also provided in writing at the conclusion of the visit. BI-RADS CATEGORY  4: Suspicious. Electronically Signed   By:  Evangeline Dakin M.D.   On: 06/05/2017 16:37   Mm Diag Breast Tomo Uni Left  Result Date: 06/05/2017 CLINICAL DATA:  Recall from screening mammography with tomosynthesis, possible mass or focal asymmetry in the upper inner subareolar left breast. EXAM: 2D DIGITAL DIAGNOSTIC LEFT MAMMOGRAM WITH ADJUNCT TOMO ULTRASOUND LEFT BREAST COMPARISON:  Mammography 06/01/2017, 05/29/2016 and earlier. Left breast ultrasound 03/24/2011 was performed in a different part of the breast. ACR Breast Density Category d: The breast tissue is extremely dense, which lowers the sensitivity of mammography. FINDINGS: Standard and tomosynthesis spot compression CC and MLO views of the area of concern in the left breast were obtained. The spot compression MLO views demonstrates a circumscribed oval medium density mass measuring approximately 6 mm without associated architectural distortion or suspicious calcification. This is not visualized on the spot compression CC view. On physical exam, there is no palpable abnormality in the upper inner periareolar left breast. Targeted left breast ultrasound is performed, showing a possible intraductal mass at the 10 o'clock position approximately 1 cm from the nipple measuring approximately 3 x 7 x 7 mm. There is no internal power Doppler flow. Sonographic evaluation the left axilla demonstrates no pathologic lymphadenopathy. IMPRESSION: 1. Approximate 7 mm possible intraductal mass in the upper inner subareolar left breast which accounts for the area of concern on screening mammography. 2. No pathologic left axillary lymphadenopathy. RECOMMENDATION: Ultrasound-guided core needle biopsy of the possible intraductal mass in the left breast in order to exclude a high risk papilloma or DCIS. The ultrasound biopsy procedure was discussed with the patient and her questions were answered. She has agreed to proceed and the biopsy has been scheduled for Friday, August 10 at 3:45 p.m. I have discussed the  findings and recommendations with the patient. Results were also provided in writing at the conclusion of the visit. BI-RADS CATEGORY  4: Suspicious. Electronically Signed   By: Evangeline Dakin M.D.   On: 06/05/2017 16:37    EKG: Independently reviewed. Sinus tachycardia (rate 113), chronic RBBB.   Assessment/Plan  1. Sepsis  - Pt presents with fever, tachycardia, leukocytosis, AKI, confusion, and slight elevation in lactate  -  CXR clear, UA unremarkable, abd exam benign, no meningismus, no wounds identified  - Blood and urine cultures collected in ED, 30 cc/kg NS bolus given, and empiric vancomycin and Zosyn administered - Plan to continue empiric abx while following cultures and clinical course     2. Acute kidney injury superimposed on CKD stage III  - SCr is 2.10 on admission, up from priors in 1.6-1.7 range - Most likely prerenal in setting of acute febrile illness with tachycardia and clinical dehydration  - She has been fluid-resuscitated in ED  - Check renal US for obstruction, repeat chemistries in am   3. Acute encephalopathy  - Pt developed acute confusion at home just PTA, suspected secondary to the acute febrile illness  - She is improving in the ED as she defervesces  - No new focal neurologic deficits identified  - Anticipate resolution with treatment #1  4. Hx of CVA with residual cognitive and motor deficits - No new focal deficits identified  - Conitnue daily ASA, Lipitor  5. Hypertension - BP is elevated in ED  - Continue Lopressor and diltiazem as tolerated  - Use hydralazine IVP's prn   6. Insulin-dependent DM  - A1c was 8.6% in 2014 - Managed at home with Levemir 8 units qHS and Novolog 4-9 units TID  - Check CBG's and start a moderate-intensity sliding-scale with Novolog    7. GERD  - No EGD report on file  - Managed with daily PPI at home, will continue    8. Chronic diastolic CHF - Pt is hypovolemic on admission in setting of acute febrile illness  and was treated with 30 cc/kg NS  - TTE (11/16/16) with EF 79-03%, grade 1 diastolic dysfunction, no WMA's, mild MR, and moderate TR  - Managed at home with Lasix 20 mg qD, metoprolol  - Follow I/O's and daily wts, hold Lasix and resume as needed, continue beta-blocker as tolerated    9. Seizure hx  - Developed after her last CVA  - Continue Depakote    DVT prophylaxis: sq heparin Code Status: Full  Family Communication: Husband updated at bedside Disposition Plan: Admit to med-surg Consults called: None Admission status: Inpatient    Vianne Bulls, MD [facility] Pager [phone]  If 7PM-7AM, please contact night-coverage [email or web address] Password [email or web address]  06/06/2017, 12:03 AM

## 2017-06-06 NOTE — Progress Notes (Signed)
[facility]                                                                              Patient Demographics  [name] [name], is a 76 y.o. female, DOB - 04/05/1941, YNW:[id number]  Admit date - 06/05/2017   Admitting Physician Vianne Bulls, MD  Outpatient Primary MD for the patient is [name], MD  Outpatient specialists:   LOS - 1  days    Chief Complaint  Patient presents with  . Altered Mental Status       Brief summary  [name] [name] is a 76 y.o. female with medical history significant for chronic kidney disease stage III, insulin-dependent diabetes mellitus, hypertension, GERD, and history of stroke with motor and cognitive deficits, Who presented with one-day history of chills and confusion associated with fever and tachycardia and leukocytosis of more than 15,000. Chest x-ray was negative for acute infiltrates, twelve-lead EKG did not reveal any acute ST-T wave changes, her lactic acid level was elevated to 2.07 with negative UA.  Have creatinine had gone up from 1.7 in January to 2.10 with bun of 41.  Patient is admitted for SIRS/Sepsis after initiation of IV fluids with IV vancomycin and Zosyn in the ED.  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   AKI (acute kidney injury) ([place])  #1 Escherichia coli sepsis: Blood culture positive for Escherichia coli - final culture report pending Lactic acid level normalized Changes Zosyn to ceftriaxone, 06/06/2017 Continue IV fluids Supportive care  #2 acute on chronic kidney disease/AKI: Baseline creatinine around 1.6-1.7 Prerenal due to dehydration  Monitor renal function  with electrolytes Renal ultrasound 06/06/2017 -mild right hydronephrosis, NEW  #3 right hydronephrosis: New Follow urine culture Consider urology consult if renal function is not improved Outpatient follow-up  #4 acute encephalopathy: Due to infectious etiology with superimposed metabolic etiology Supportive care  #5 insulin-dependent diabetes mellitus: Check A1c Sliding-scale insulin  #6 hypertension: Which initially elevated in the ED Continue antihypertensive regimen When necessary IV hydralazine  #7 chronic diastolic CHF: TTE, 65/78/4696-EX 6500% with grade 1 diastolic dysfunction moderate TR, mild MR Hold Lasix for now  Monitor I's/O's with daily weights    Code Status: Full code DVT Prophylaxis:  heparin Family Communication:  Disposition Plan: To be determined  Time Spent in minutes   45 minutes  Procedures:    Consultants:     Antimicrobials:   IV vancomycin and Zosyn -discontinued 06/06/2017 IV ceftriaxone-started 06/06/2017  Medications  Scheduled Meds: . aspirin EC  81 mg Oral Daily  . atorvastatin  10 mg Oral q1800  . diltiazem  90 mg Oral BID  . divalproex  750 mg Oral Daily  . heparin  5,000 Units Subcutaneous Q8H  . insulin aspart  0-15 Units Subcutaneous Q4H  . magnesium oxide  400 mg Oral BID  . metoprolol tartrate  25 mg Oral BID  . multivitamin with minerals  1 tablet Oral Daily  . pantoprazole  40 mg Oral Daily   Continuous Infusions: . cefTRIAXone (ROCEPHIN)  IV    . sodium chloride     PRN Meds:.acetaminophen **OR** acetaminophen, hydrALAZINE, morphine injection, ondansetron **OR** ondansetron (ZOFRAN) IV   Antibiotics   Anti-infectives    Start     Dose/Rate Route Frequency Ordered Stop   06/06/17 2200  vancomycin (VANCOCIN) 500 mg in sodium chloride 0.9 % 100 mL IVPB  Status:  Discontinued     500 mg 100 mL/hr over 60 Minutes Intravenous Every 24 hours 06/06/17 0008 06/06/17 1355   06/06/17 2000  cefTRIAXone (ROCEPHIN)  2 g in dextrose 5 % 50 mL IVPB     2 g 100 mL/hr over 30 Minutes Intravenous Every 24 hours 06/06/17 1355     06/06/17 0600  piperacillin-tazobactam (ZOSYN) IVPB 3.375 g  Status:  Discontinued     3.375 g 12.5 mL/hr over 240 Minutes Intravenous Every 8 hours 06/06/17 0008 06/06/17 1355   06/05/17 2300  vancomycin (VANCOCIN) IVPB 1000 mg/200 mL premix     1,000 mg 200 mL/hr over 60 Minutes Intravenous  Once 06/05/17 2246 06/06/17 0014   06/05/17 2300  piperacillin-tazobactam (ZOSYN) IVPB 3.375 g     3.375 g 100 mL/hr over 30 Minutes Intravenous  Once 06/05/17 2246 06/05/17 2336        Subjective:   [name] [name] was seen and examined today.  Admitting H&P reviewed and noted. No acute events reported by nursing overnight. No fever or chills..    Objective:   Vitals:   06/06/17 0100 06/06/17 0244 06/06/17 0917 06/06/17 1202  BP: (!) 123/59 126/69 (!) 134/47 132/86  Pulse: 98 93 95 92  Resp: (!) 26 (!) 21 20   Temp:  98.5 F (36.9 C) 99.7 F (37.6 C)   TempSrc:  Oral Oral   SpO2: 92% 95% 99% 98%  Weight:  66.5 kg (146 lb 9.7 oz)    Height:  5\' 5"  (1.651 m)      Intake/Output Summary (Last 24 hours) at 06/06/17 1439 Last data filed at 06/06/17 1415  Gross per 24 hour  Intake             2420 ml  Output             2000 ml  Net              420 ml     Wt Readings from Last 3 Encounters:  06/06/17 66.5 kg (146 lb 9.7 oz)  11/21/16 66 kg (145 lb 8.1 oz)  08/27/16 64.6 kg (142 lb 6.4 oz)     Exam  Gen:  resting comfortably, NAD Eyes: PERTLA, lids and conjunctivae normal ENMT: Mucous membranes are moist. Posterior pharynx clear of any exudate or lesions.   Neck: normal, supple, no masses, no thyromegaly Respiratory: clear to auscultation bilaterally, no wheezing, no crackles. Normal respiratory effort.   Cardiovascular: RRR. No JVD. Abdomen: No distension, no tenderness, no masses palpated. Bowel sounds active.  Musculoskeletal: no clubbing / cyanosis. No joint  deformity upper and lower extremities.   Skin: no significant rashes, lesions, ulcers. Warm, dry, well-perfused. Neurologic: Alert and responsive obeys simple commands, oriented 2. Fine resting tremor of hands  Psychiatric:  normal affect   Data Reviewed:  I have personally reviewed following labs and imaging studies  Micro Results Recent  Results (from the past 240 hour(s))  Culture, blood (Routine x 2)     Status: None (Preliminary result)   Collection Time: 06/05/17 10:35 PM  Result Value Ref Range Status   Specimen Description BLOOD RIGHT ANTECUBITAL  Final   Special Requests   Final    BOTTLES DRAWN AEROBIC AND ANAEROBIC Blood Culture adequate volume   Culture  Setup Time   Final    GRAM NEGATIVE RODS IN BOTH AEROBIC AND ANAEROBIC BOTTLES CRITICAL RESULT CALLED TO, READ BACK BY AND VERIFIED WITH: R RUMBARGER,PHARMD AT 2951 06/06/17 BY L BENFIELD    Culture GRAM NEGATIVE RODS  Final   Report Status PENDING  Incomplete  Blood Culture ID Panel (Reflexed)     Status: Abnormal   Collection Time: 06/05/17 10:35 PM  Result Value Ref Range Status   Enterococcus species NOT DETECTED NOT DETECTED Final   Listeria monocytogenes NOT DETECTED NOT DETECTED Final   Staphylococcus species NOT DETECTED NOT DETECTED Final   Staphylococcus aureus NOT DETECTED NOT DETECTED Final   Streptococcus species NOT DETECTED NOT DETECTED Final   Streptococcus agalactiae NOT DETECTED NOT DETECTED Final   Streptococcus pneumoniae NOT DETECTED NOT DETECTED Final   Streptococcus pyogenes NOT DETECTED NOT DETECTED Final   Acinetobacter baumannii NOT DETECTED NOT DETECTED Final   Enterobacteriaceae species DETECTED (A) NOT DETECTED Final    Comment: Enterobacteriaceae represent a large family of gram-negative bacteria, not a single organism. CRITICAL RESULT CALLED TO, READ BACK BY AND VERIFIED WITH: R RUMBARGER,PHARMD AT 1149 06/06/17 BY L BENFIELD    Enterobacter cloacae complex NOT DETECTED NOT DETECTED Final     Escherichia coli DETECTED (A) NOT DETECTED Final    Comment: CRITICAL RESULT CALLED TO, READ BACK BY AND VERIFIED WITH: R RUMBARGER,PHARMD AT 1149 06/06/17 BY L BENFIELD    Klebsiella oxytoca NOT DETECTED NOT DETECTED Final   Klebsiella pneumoniae NOT DETECTED NOT DETECTED Final   Proteus species NOT DETECTED NOT DETECTED Final   Serratia marcescens NOT DETECTED NOT DETECTED Final   Carbapenem resistance NOT DETECTED NOT DETECTED Final   Haemophilus influenzae NOT DETECTED NOT DETECTED Final   Neisseria meningitidis NOT DETECTED NOT DETECTED Final   Pseudomonas aeruginosa NOT DETECTED NOT DETECTED Final   Candida albicans NOT DETECTED NOT DETECTED Final   Candida glabrata NOT DETECTED NOT DETECTED Final   Candida krusei NOT DETECTED NOT DETECTED Final   Candida parapsilosis NOT DETECTED NOT DETECTED Final   Candida tropicalis NOT DETECTED NOT DETECTED Final    Radiology Reports Dg Chest 2 View  Result Date: 06/05/2017 CLINICAL DATA:  Fever, weakness for 2 days. History of stroke, hypertension, diabetes. EXAM: CHEST  2 VIEW COMPARISON:  Chest x-ray dated 11/15/2016. FINDINGS: Heart size and mediastinal contours are within normal limits. Atherosclerotic changes noted at the aortic arch. Lungs are clear. No pleural effusion or pneumothorax seen. No acute or suspicious osseous finding. Calcifications about both shoulders suggests chronic calcific tendinopathy. IMPRESSION: No active cardiopulmonary disease. No evidence of pneumonia or pulmonary edema. Aortic atherosclerosis. Electronically Signed   By: Franki Cabot M.D.   On: 06/05/2017 23:39   US Renal  Result Date: 06/06/2017 CLINICAL DATA:  76 year old female with acute renal insufficiency, altered mental status, and fever. EXAM: RENAL / URINARY TRACT ULTRASOUND COMPLETE COMPARISON:  Ultrasound dated 05/12/2017 FINDINGS: Right Kidney: Length: 8.7 cm. There is mild increased echogenicity and parenchymal thinning. There is mild right  hydronephrosis. There is a 1.0 x 0.7 x 1.0 cm hypoechoic lesion in the upper  pole, likely a complex cyst, similar to prior ultrasound. Left Kidney: Length: 9.4 cm. Mild increased echogenicity. There is parenchymal atrophy and cortical thinning. There is a 4.2 x 3.2 x 2.7 cm inferior pole cyst. No hydronephrosis or echogenic stone. Bladder: Appears normal for degree of bladder distention. IMPRESSION: 1. Mild increased echogenicity and parenchymal atrophy. 2. Mild right hydronephrosis.  No obstructing stone. 3. Left renal cyst.  Probable right renal complex cyst. 4. Unremarkable urinary bladder. Electronically Signed   By: Anner Crete M.D.   On: 06/06/2017 01:26   US Renal  Result Date: 05/12/2017 CLINICAL DATA:  76 year old hypertensive diabetic female with chronic kidney disease. Subsequent encounter. EXAM: RENAL / URINARY TRACT ULTRASOUND COMPLETE COMPARISON:  11/17/2016 ultrasound. FINDINGS: Right Kidney: Length: 7.8 cm. No hydronephrosis. Increase echogenicity. 8 mm right upper pole cyst. Left Kidney: Length: 8.7 cm. No hydronephrosis. Increased echogenicity. Lower pole cyst measuring up to 4.5 cm. Bowel gas slightly limits evaluation. Bladder: Appears normal for degree of bladder distention. IMPRESSION: Progressive increased echogenicity of renal parenchyma consistent with medical renal disease changes. No hydronephrosis. 8 mm right upper pole renal cyst and left lower pole 4.5 cm cyst. Electronically Signed   By: Genia Del M.D.   On: 05/12/2017 15:55   US Breast Ltd Uni Left Inc Axilla  Result Date: 06/05/2017 CLINICAL DATA:  Recall from screening mammography with tomosynthesis, possible mass or focal asymmetry in the upper inner subareolar left breast. EXAM: 2D DIGITAL DIAGNOSTIC LEFT MAMMOGRAM WITH ADJUNCT TOMO ULTRASOUND LEFT BREAST COMPARISON:  Mammography 06/01/2017, 05/29/2016 and earlier. Left breast ultrasound 03/24/2011 was performed in a different part of the breast. ACR Breast Density  Category d: The breast tissue is extremely dense, which lowers the sensitivity of mammography. FINDINGS: Standard and tomosynthesis spot compression CC and MLO views of the area of concern in the left breast were obtained. The spot compression MLO views demonstrates a circumscribed oval medium density mass measuring approximately 6 mm without associated architectural distortion or suspicious calcification. This is not visualized on the spot compression CC view. On physical exam, there is no palpable abnormality in the upper inner periareolar left breast. Targeted left breast ultrasound is performed, showing a possible intraductal mass at the 10 o'clock position approximately 1 cm from the nipple measuring approximately 3 x 7 x 7 mm. There is no internal power Doppler flow. Sonographic evaluation the left axilla demonstrates no pathologic lymphadenopathy. IMPRESSION: 1. Approximate 7 mm possible intraductal mass in the upper inner subareolar left breast which accounts for the area of concern on screening mammography. 2. No pathologic left axillary lymphadenopathy. RECOMMENDATION: Ultrasound-guided core needle biopsy of the possible intraductal mass in the left breast in order to exclude a high risk papilloma or DCIS. The ultrasound biopsy procedure was discussed with the patient and her questions were answered. She has agreed to proceed and the biopsy has been scheduled for Friday, August 10 at 3:45 p.m. I have discussed the findings and recommendations with the patient. Results were also provided in writing at the conclusion of the visit. BI-RADS CATEGORY  4: Suspicious. Electronically Signed   By: Evangeline Dakin M.D.   On: 06/05/2017 16:37   Mm Diag Breast Tomo Uni Left  Result Date: 06/05/2017 CLINICAL DATA:  Recall from screening mammography with tomosynthesis, possible mass or focal asymmetry in the upper inner subareolar left breast. EXAM: 2D DIGITAL DIAGNOSTIC LEFT MAMMOGRAM WITH ADJUNCT TOMO ULTRASOUND  LEFT BREAST COMPARISON:  Mammography 06/01/2017, 05/29/2016 and earlier. Left breast ultrasound 03/24/2011 was performed  in a different part of the breast. ACR Breast Density Category d: The breast tissue is extremely dense, which lowers the sensitivity of mammography. FINDINGS: Standard and tomosynthesis spot compression CC and MLO views of the area of concern in the left breast were obtained. The spot compression MLO views demonstrates a circumscribed oval medium density mass measuring approximately 6 mm without associated architectural distortion or suspicious calcification. This is not visualized on the spot compression CC view. On physical exam, there is no palpable abnormality in the upper inner periareolar left breast. Targeted left breast ultrasound is performed, showing a possible intraductal mass at the 10 o'clock position approximately 1 cm from the nipple measuring approximately 3 x 7 x 7 mm. There is no internal power Doppler flow. Sonographic evaluation the left axilla demonstrates no pathologic lymphadenopathy. IMPRESSION: 1. Approximate 7 mm possible intraductal mass in the upper inner subareolar left breast which accounts for the area of concern on screening mammography. 2. No pathologic left axillary lymphadenopathy. RECOMMENDATION: Ultrasound-guided core needle biopsy of the possible intraductal mass in the left breast in order to exclude a high risk papilloma or DCIS. The ultrasound biopsy procedure was discussed with the patient and her questions were answered. She has agreed to proceed and the biopsy has been scheduled for Friday, August 10 at 3:45 p.m. I have discussed the findings and recommendations with the patient. Results were also provided in writing at the conclusion of the visit. BI-RADS CATEGORY  4: Suspicious. Electronically Signed   By: Evangeline Dakin M.D.   On: 06/05/2017 16:37   Mm Screening Breast Tomo Bilateral  Result Date: 06/02/2017 CLINICAL DATA:  Screening. EXAM: 2D  DIGITAL SCREENING BILATERAL MAMMOGRAM WITH CAD AND ADJUNCT TOMO COMPARISON:  Previous exam(s). ACR Breast Density Category c: The breast tissue is heterogeneously dense, which may obscure small masses. FINDINGS: In the left breast, a possible asymmetry warrants further evaluation. In the right breast, no findings suspicious for malignancy. Images were processed with CAD. IMPRESSION: Further evaluation is suggested for possible asymmetry in the left breast. RECOMMENDATION: Diagnostic mammogram and possibly ultrasound of the left breast. (Code:[id number]) The patient will be contacted regarding the findings, and additional imaging will be scheduled. BI-RADS CATEGORY  0: Incomplete. Need additional imaging evaluation and/or prior mammograms for comparison. Electronically Signed   By: Fidela Salisbury M.D.   On: 06/02/2017 11:08    Lab Data:  CBC:  Recent Labs Lab 06/05/17 2234 06/05/17 2320 06/06/17 0139  WBC 15.5*  --  17.6*  NEUTROABS  --  11.9* 12.1*  HGB 11.2*  --  10.5*  HCT 34.6*  --  32.7*  MCV 101.2*  --  100.6*  PLT 192  --  295   Basic Metabolic Panel:  Recent Labs Lab 06/05/17 2234 06/06/17 0139  NA 136 139  K 4.6 4.4  CL 97* 105  CO2 27 25  GLUCOSE 167* 239*  BUN 41* 37*  CREATININE 2.10* 1.88*  CALCIUM 9.0 7.8*   GFR: Estimated Creatinine Clearance: 22.9 mL/min (A) (by C-G formula based on SCr of 1.88 mg/dL (H)). Liver Function Tests:  Recent Labs Lab 06/05/17 2234  AST 27  ALT 13*  ALKPHOS 116  BILITOT 0.7  PROT 6.7  ALBUMIN 3.1*   No results for input(s): LIPASE, AMYLASE in the last 168 hours. No results for input(s): AMMONIA in the last 168 hours. Coagulation Profile:  Recent Labs Lab 06/05/17 2234  INR 0.90   Cardiac Enzymes: No results for input(s): CKTOTAL, CKMB, CKMBINDEX, TROPONINI  in the last 168 hours. BNP (last 3 results) No results for input(s): PROBNP in the last 8760 hours. HbA1C: No results for input(s): HGBA1C in the last 72  hours. CBG:  Recent Labs Lab 06/06/17 0449 06/06/17 0740 06/06/17 1155  GLUCAP 260* 154* 103*   Lipid Profile: No results for input(s): CHOL, HDL, LDLCALC, TRIG, CHOLHDL, LDLDIRECT in the last 72 hours. Thyroid Function Tests: No results for input(s): TSH, T4TOTAL, FREET4, T3FREE, THYROIDAB in the last 72 hours. Anemia Panel: No results for input(s): VITAMINB12, FOLATE, FERRITIN, TIBC, IRON, RETICCTPCT in the last 72 hours. Urine analysis:    Component Value Date/Time   COLORURINE STRAW (A) 06/05/2017 2301   APPEARANCEUR CLEAR 06/05/2017 2301   LABSPEC 1.011 06/05/2017 2301   PHURINE 8.0 06/05/2017 2301   GLUCOSEU NEGATIVE 06/05/2017 2301   HGBUR NEGATIVE 06/05/2017 2301   BILIRUBINUR NEGATIVE 06/05/2017 2301   BILIRUBINUR neg 08/16/2012 1531   KETONESUR NEGATIVE 06/05/2017 2301   PROTEINUR 30 (A) 06/05/2017 2301   UROBILINOGEN 0.2 06/25/2013 1002   NITRITE NEGATIVE 06/05/2017 2301   LEUKOCYTESUR NEGATIVE 06/05/2017 [place] M.D. [phone] [facility] 06/06/2017, 2:39 PM  Pager: [phone] Between 7am to 7pm - call Pager - [phone]  After 7pm go to [email or web address] - password [email or web address]  Call night coverage person covering after 7pm

## 2017-06-06 NOTE — Progress Notes (Signed)
Patient reports having difficulty swallowing. MD notified. No further orders. Will continue to monitor.

## 2017-06-06 NOTE — Evaluation (Signed)
Clinical/Bedside Swallow Evaluation Patient Details  Name: [name] [name] MRN: [id number] Date of Birth: Mar 26, 1941  Today's Date: 06/06/2017 Time: SLP Start Time (ACUTE ONLY): 0973 SLP Stop Time (ACUTE ONLY): 1735 SLP Time Calculation (min) (ACUTE ONLY): 20 min  Past Medical History:  Past Medical History:  Diagnosis Date  . Anemia   . Arthritis    "a touch in my hands" (11/17/2016)  . Chronic back pain   . Chronic kidney disease (CKD), active medical management without dialysis, stage 3 (moderate)   . Depression   . GERD (gastroesophageal reflux disease)   . High cholesterol   . Hyperlipidemia   . Hypertension   . Seizures ([place])    last seizure 2014 (11/17/2016)  . Stroke [facility]) 2013   Secondary to acute right posterior temporo-occipital intra-axial hemorrhage  . Type II diabetes mellitus ([place])    Past Surgical History:  Past Surgical History:  Procedure Laterality Date  . CATARACT EXTRACTION W/ INTRAOCULAR LENS IMPLANT Bilateral   . COLONOSCOPY    . DILATION AND CURETTAGE OF UTERUS  X 3  . 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 Chest x-ray was negative for acute infiltrates. Patient is admitted for SIRS/Sepsis. 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    Assessment / Plan /  Recommendation Clinical Impression  Patient presents with mild risk for aspiration given prior CVA with remote history of dysphagia (has been consuming regular diet with thin liquids for several years), cognitive impairment and history of GERD. Pt is alert and cooperative, oriented to self and location. She does have baseline cognitive deficits, which appear to impact her ability to self-feed. She needed mod A for initiation and bolus retrieval, however once bolus reaches oral cavity, her swallowing function appears within functional limits. No overt signs of aspiration noted despite challenging with consecutive straw sips of thin liquids in excess of 3 oz. Oral manipulation, control and clearance adequate for solids, and swallow appears timely. RN reports pt had difficulty with pills this morning, particularly larger pills. Recommend she continue current diet, medications can be given whole in puree; may break larger pills into smaller pieces should this improve pt comfort. Given her cognitive status, she will need assistance for self-feeding; please allow to her to self-feed as much as she is able. No further skilled ST needs identified at this time, SLP will s/o. SLP Visit Diagnosis: Dysphagia, unspecified (R13.10)    Aspiration Risk  Mild aspiration risk    Diet Recommendation Regular;Thin liquid   Liquid Administration via: Straw;Cup Medication Administration: Whole meds with puree (larger medications broken into smaller pieces) Supervision: Staff to assist with self feeding Compensations: Minimize environmental distractions;Slow rate;Small sips/bites Postural Changes: Seated upright at 90 degrees    Other  Recommendations Oral Care Recommendations: Oral care BID   Follow up Recommendations None      Frequency and Duration  Prognosis Prognosis for Safe Diet Advancement: Good Barriers to Reach Goals: Cognitive deficits      Swallow Study   General Date of Onset:  06/05/17 HPI: [name] [name] a 76 y.o.femalewith medical history significant for chronic kidney disease stage III, insulin-dependent diabetes mellitus, hypertension, GERD, and history of stroke with motor and cognitive deficits, who presented with one-day history of chills and confusion associated with fever and tachycardia and leukocytosis of more than 15,000. Chest x-ray was negative for acute infiltrates. Patient is admitted for SIRS/Sepsis. 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  Type of Study: Bedside Swallow Evaluation Previous Swallow Assessment: see HPI Diet Prior to this Study: Regular;Thin liquids Temperature Spikes Noted: Yes Respiratory Status: Room air History of Recent Intubation: No Behavior/Cognition: Alert;Cooperative;Confused Oral Cavity Assessment: Within Functional Limits Oral Care Completed by SLP: No Oral Cavity - Dentition: Adequate natural dentition Vision: Functional for self-feeding Self-Feeding Abilities: Needs assist Patient Positioning: Upright in bed Baseline Vocal Quality: Normal Volitional Cough: Strong Volitional Swallow: Unable to elicit    Oral/Motor/Sensory Function Overall Oral Motor/Sensory Function: Within functional limits   Ice Chips Ice chips: Not tested   Thin Liquid Thin Liquid: Within functional limits Presentation: Straw;Self Fed    Nectar Thick Nectar Thick Liquid: Not tested   Honey Thick Honey Thick Liquid: Not tested   Puree Puree: Within functional limits Presentation: Spoon (Pt requires assistance for bolus retrieval )   Solid   GO   Solid: Within functional limits Presentation: [place], [place], [place] Speech-Language Pathologist [phone]  Aliene Altes 06/06/2017,5:57 PM

## 2017-06-06 NOTE — Progress Notes (Signed)
Pharmacy Antibiotic Note  [name] [name] is a 76 y.o. female admitted on 06/05/2017 with sepsis.  Pharmacy has been consulted for Vancomycin/Zosyn dosing. No clear source of infection. WBC elevated. AKI present.   Plan: Vancomycin 1000 mg IV already given, then give 500 mg IV q24h, increase dose if/when Scr improves Zosyn 3.375G IV q8h to be infused over 4 hours Trend WBC, temp, renal function  F/U infectious work-up Drug levels as indicated   Height: 5\' 5"  (165.1 cm) Weight: 147 lb (66.7 kg) IBW/kg (Calculated) : 57  Temp (24hrs), Avg:103 F (39.4 C), Min:103 F (39.4 C), Max:103 F (39.4 C)   Recent Labs Lab 06/05/17 2234 06/05/17 2255  WBC 15.5*  --   CREATININE 2.10*  --   LATICACIDVEN  --  2.07*    Estimated Creatinine Clearance: 20.5 mL/min (A) (by C-G formula based on SCr of 2.1 mg/dL (H)).    Allergies  Allergen Reactions  . Barbiturates     Becomes restless.  Also with all "strong" medications like sleeping pills  . Latex Itching     [name] [name] 06/06/2017 12:03 AM

## 2017-06-06 NOTE — ED Notes (Signed)
Attempted to call report, RN busy at this time, will return call to ED

## 2017-06-07 LAB — HEMOGLOBIN A1C
Hgb A1c MFr Bld: 7.3 % — ABNORMAL HIGH (ref 4.8–5.6)
MEAN PLASMA GLUCOSE: 163 mg/dL

## 2017-06-07 LAB — URINE CULTURE: Culture: <10000 — AB

## 2017-06-07 LAB — BASIC METABOLIC PANEL
Anion gap: 16 — ABNORMAL HIGH (ref 5–15)
BUN: 38 mg/dL — AB (ref 6–20)
CO2: 23 mmol/L (ref 22–32)
CREATININE: 2.19 mg/dL — AB (ref 0.44–1.00)
Calcium: 8.8 mg/dL — ABNORMAL LOW (ref 8.9–10.3)
Chloride: 101 mmol/L (ref 101–111)
GFR, EST AFRICAN AMERICAN: 24 mL/min — AB (ref >60)
GFR, EST NON AFRICAN AMERICAN: 21 mL/min — AB (ref >60)
Glucose, Bld: 189 mg/dL — ABNORMAL HIGH (ref 65–99)
POTASSIUM: 3.7 mmol/L (ref 3.5–5.1)
SODIUM: 140 mmol/L (ref 135–145)

## 2017-06-07 LAB — CBC
HCT: 36.4 % (ref 36.0–46.0)
Hemoglobin: 11.3 g/dL — ABNORMAL LOW (ref 12.0–15.0)
MCH: 31.6 pg (ref 26.0–34.0)
MCHC: 31 g/dL (ref 30.0–36.0)
MCV: 101.7 fL — ABNORMAL HIGH (ref 78.0–100.0)
PLATELETS: 195 10*3/uL (ref 150–400)
RBC: 3.58 MIL/uL — ABNORMAL LOW (ref 3.87–5.11)
RDW: 13.3 % (ref 11.5–15.5)
WBC: 17.6 10*3/uL — AB (ref 4.0–10.5)

## 2017-06-07 LAB — GLUCOSE, CAPILLARY
GLUCOSE-CAPILLARY: 96 mg/dL (ref 65–99)
GLUCOSE-CAPILLARY: 172 mg/dL — AB (ref 65–99)
GLUCOSE-CAPILLARY: 323 mg/dL — AB (ref 65–99)
Glucose-Capillary: 192 mg/dL — ABNORMAL HIGH (ref 65–99)
Glucose-Capillary: 239 mg/dL — ABNORMAL HIGH (ref 65–99)

## 2017-06-07 MED ORDER — SODIUM CHLORIDE 0.45 % IV SOLN
INTRAVENOUS | Status: DC
  Administered 2017-06-07 – 2017-06-10 (×4): via INTRAVENOUS

## 2017-06-07 NOTE — Progress Notes (Signed)
[facility]                                                                              Patient Demographics  [name] [name], is a 76 y.o. female, DOB - 1941/09/05, WOE:[id number]  Admit date - 06/05/2017   Admitting Physician Vianne Bulls, MD  Outpatient Primary MD for the patient is [name], MD  Outpatient specialists:   LOS - 2  days    Chief Complaint  Patient presents with  . Altered Mental Status       Brief summary  [name] [name] is a 76 y.o. female with medical history significant for chronic kidney disease stage III, insulin-dependent diabetes mellitus, hypertension, GERD, and history of stroke with motor and cognitive deficits, Who presented with one-day history of chills and confusion associated with fever and tachycardia and leukocytosis of more than 15,000. Chest x-ray was negative for acute infiltrates, twelve-lead EKG did not reveal any acute ST-T wave changes, her lactic acid level was elevated to 2.07 with negative UA.  Have creatinine had gone up from 1.7 in January to 2.10 with bun of 41.  Patient is admitted for SIRS/Sepsis after initiation of IV fluids with IV vancomycin and Zosyn in the ED.  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   AKI (acute kidney injury) ([place])  #1 Escherichia coli sepsis: Blood culture positive for Escherichia coli - final culture report pending Lactic acid level normalized Changed Zosyn to ceftriaxone, 06/06/2017 Continue IV fluids Supportive care  #2 acute on chronic kidney disease/AKI: Baseline creatinine around 1.6-1.7 Prerenal due to dehydration  Monitor renal function  with electrolytes Renal ultrasound 06/06/2017 -mild right hydronephrosis, NEW  #3 right hydronephrosis: New Mild - but with upward Cr I called urology consult today Follow urine culture Consider urology consult if renal function is not improved Outpatient follow-up   #4 acute encephalopathy: Improving, near baseline Due to infectious etiology with superimposed metabolic etiology Supportive care  #5 insulin-dependent diabetes mellitus: Check A1c 06/05/17- 7.3% Sliding-scale insulin  #6 hypertension: Was initially elevated in the ED Continue antihypertensive regimen When necessary IV hydralazine  #7 chronic diastolic CHF: TTE, 00/37/0488-QB 6500% with grade 1 diastolic dysfunction moderate TR, mild MR Hold Lasix for now  Monitor I's/O's with daily weights    Code Status: Full code DVT Prophylaxis:  heparin Family Communication:  Disposition Plan: To be determined  Time Spent in minutes   25 minutes  Procedures:    Consultants:     Antimicrobials:   IV vancomycin and Zosyn -discontinued 06/06/2017 IV ceftriaxone-started 06/06/2017  Medications  Scheduled Meds: . aspirin EC  81 mg Oral Daily  . atorvastatin  10 mg Oral q1800  . diltiazem  90 mg Oral BID  . divalproex  750 mg Oral Daily  . heparin  5,000 Units Subcutaneous Q8H  . insulin aspart  0-15 Units Subcutaneous Q4H  . magnesium oxide  400 mg Oral BID  . metoprolol tartrate  25 mg Oral BID  . multivitamin with minerals  1 tablet Oral Daily  . pantoprazole  40 mg Oral Daily   Continuous Infusions: . sodium chloride 75 mL/hr at 06/07/17 1214  . cefTRIAXone (ROCEPHIN)  IV Stopped (06/06/17 2344)  . sodium chloride     PRN Meds:.acetaminophen **OR** acetaminophen, hydrALAZINE, morphine injection, ondansetron **OR** ondansetron (ZOFRAN) IV   Antibiotics   Anti-infectives    Start     Dose/Rate Route Frequency Ordered Stop   06/06/17 2200  vancomycin (VANCOCIN) 500 mg in sodium chloride 0.9 % 100  mL IVPB  Status:  Discontinued     500 mg 100 mL/hr over 60 Minutes Intravenous Every 24 hours 06/06/17 0008 06/06/17 1355   06/06/17 2000  cefTRIAXone (ROCEPHIN) 2 g in dextrose 5 % 50 mL IVPB     2 g 100 mL/hr over 30 Minutes Intravenous Every 24 hours 06/06/17 1355     06/06/17 0600  piperacillin-tazobactam (ZOSYN) IVPB 3.375 g  Status:  Discontinued     3.375 g 12.5 mL/hr over 240 Minutes Intravenous Every 8 hours 06/06/17 0008 06/06/17 1355   06/05/17 2300  vancomycin (VANCOCIN) IVPB 1000 mg/200 mL premix     1,000 mg 200 mL/hr over 60 Minutes Intravenous  Once 06/05/17 2246 06/06/17 0014   06/05/17 2300  piperacillin-tazobactam (ZOSYN) IVPB 3.375 g     3.375 g 100 mL/hr over 30 Minutes Intravenous  Once 06/05/17 2246 06/05/17 2336        Subjective:   [name] [name] was seen and examined today.No fever or chills, no sob  Objective:   Vitals:   06/06/17 1723 06/06/17 2004 06/07/17 0414 06/07/17 0913  BP: (!) 134/50 138/62 (!) 134/54 (!) 123/44  Pulse: 97 100 94 88  Resp: 20 19 19 18   Temp: (!) 101.8 F (38.8 C) 99.1 F (37.3 C) 99.7 F (37.6 C) 99.1 F (37.3 C)  TempSrc: Oral Oral Oral Oral  SpO2: 95% 95% 96% 98%  Weight:  67.6 kg (149 lb 0.5 oz)    Height:        Intake/Output Summary (Last 24 hours) at 06/07/17 1236 Last data filed at 06/07/17 1000  Gross per 24 hour  Intake              580 ml  Output             2150 ml  Net            -1570 ml     Wt Readings from Last 3 Encounters:  06/06/17 67.6 kg (149 lb 0.5 oz)  11/21/16 66 kg (145 lb 8.1 oz)  08/27/16 64.6 kg (142 lb 6.4 oz)     Exam  Gen:  resting comfortably, NAD Eyes: PERTLA, lids and conjunctivae normal ENMT: Mucous membranes are moist. Posterior pharynx clear of any exudate or lesions.   Neck: normal, supple, no masses, no thyromegaly Respiratory: clear to auscultation bilaterally, no wheezing, no crackles. Normal respiratory effort.   Cardiovascular: RRR. No JVD. Abdomen: No  distension, no tenderness, no masses palpated. Bowel sounds active.  Musculoskeletal: no clubbing / cyanosis. No joint deformity upper and lower extremities.   Skin: no significant rashes, lesions, ulcers. Warm, dry, well-perfused. Neurologic: Alert and responsive obeys simple commands, oriented 2. Fine resting tremor of hands  Psychiatric:  normal affect  Data Reviewed:  I have personally reviewed following labs and imaging studies  Micro Results Recent Results (from the past 240 hour(s))  Culture, blood (Routine x 2)     Status: Abnormal (Preliminary result)   Collection Time: 06/05/17 10:35 PM  Result Value Ref Range Status   Specimen Description BLOOD RIGHT ANTECUBITAL  Final   Special Requests   Final    BOTTLES DRAWN AEROBIC AND ANAEROBIC Blood Culture adequate volume   Culture  Setup Time   Final    GRAM NEGATIVE RODS IN BOTH AEROBIC AND ANAEROBIC BOTTLES CRITICAL RESULT CALLED TO, READ BACK BY AND VERIFIED WITH: R RUMBARGER,PHARMD AT 1149 06/06/17 BY L BENFIELD    Culture ESCHERICHIA COLI SUSCEPTIBILITIES TO FOLLOW  (A)  Final   Report Status PENDING  Incomplete  Blood Culture ID Panel (Reflexed)     Status: Abnormal   Collection Time: 06/05/17 10:35 PM  Result Value Ref Range Status   Enterococcus species NOT DETECTED NOT DETECTED Final   Listeria monocytogenes NOT DETECTED NOT DETECTED Final   Staphylococcus species NOT DETECTED NOT DETECTED Final   Staphylococcus aureus NOT DETECTED NOT DETECTED Final   Streptococcus species NOT DETECTED NOT DETECTED Final   Streptococcus agalactiae NOT DETECTED NOT DETECTED Final   Streptococcus pneumoniae NOT DETECTED NOT DETECTED Final   Streptococcus pyogenes NOT DETECTED NOT DETECTED Final   Acinetobacter baumannii NOT DETECTED NOT DETECTED Final   Enterobacteriaceae species DETECTED (A) NOT DETECTED Final    Comment: Enterobacteriaceae represent a large family of gram-negative bacteria, not a single organism. CRITICAL RESULT  CALLED TO, READ BACK BY AND VERIFIED WITH: R RUMBARGER,PHARMD AT 1149 06/06/17 BY L BENFIELD    Enterobacter cloacae complex NOT DETECTED NOT DETECTED Final   Escherichia coli DETECTED (A) NOT DETECTED Final    Comment: CRITICAL RESULT CALLED TO, READ BACK BY AND VERIFIED WITH: R RUMBARGER,PHARMD AT 1149 06/06/17 BY L BENFIELD    Klebsiella oxytoca NOT DETECTED NOT DETECTED Final   Klebsiella pneumoniae NOT DETECTED NOT DETECTED Final   Proteus species NOT DETECTED NOT DETECTED Final   Serratia marcescens NOT DETECTED NOT DETECTED Final   Carbapenem resistance NOT DETECTED NOT DETECTED Final   Haemophilus influenzae NOT DETECTED NOT DETECTED Final   Neisseria meningitidis NOT DETECTED NOT DETECTED Final   Pseudomonas aeruginosa NOT DETECTED NOT DETECTED Final   Candida albicans NOT DETECTED NOT DETECTED Final   Candida glabrata NOT DETECTED NOT DETECTED Final   Candida krusei NOT DETECTED NOT DETECTED Final   Candida parapsilosis NOT DETECTED NOT DETECTED Final   Candida tropicalis NOT DETECTED NOT DETECTED Final  Culture, blood (Routine x 2)     Status: None (Preliminary result)   Collection Time: 06/05/17 10:47 PM  Result Value Ref Range Status   Specimen Description BLOOD LEFT FOREARM  Final   Special Requests IN PEDIATRIC BOTTLE Blood Culture adequate volume  Final   Culture NO GROWTH < 24 HOURS  Final   Report Status PENDING  Incomplete  Urine culture     Status: Abnormal   Collection Time: 06/05/17 11:03 PM  Result Value Ref Range Status   Specimen Description URINE, CATHETERIZED  Final   Special Requests NONE  Final   Culture <10,000 COLONIES/mL INSIGNIFICANT GROWTH (A)  Final   Report Status 06/07/2017 FINAL  Final    Radiology Reports Dg Chest 2 View  Result Date: 06/05/2017 CLINICAL DATA:  Fever, weakness for 2 days. History of stroke, hypertension, diabetes. EXAM: CHEST  2 VIEW COMPARISON:  Chest x-ray dated 11/15/2016. FINDINGS: Heart size and mediastinal contours are  within normal limits. Atherosclerotic changes noted at the aortic arch. Lungs are clear. No pleural effusion or pneumothorax seen. No acute or suspicious osseous finding. Calcifications about both shoulders suggests chronic calcific tendinopathy. IMPRESSION: No active cardiopulmonary disease. No evidence of pneumonia or pulmonary edema. Aortic atherosclerosis. Electronically Signed   By: Franki Cabot M.D.   On: 06/05/2017 23:39   US Renal  Result Date: 06/06/2017 CLINICAL DATA:  76 year old female with acute renal insufficiency, altered mental status, and fever. EXAM: RENAL / URINARY TRACT ULTRASOUND COMPLETE COMPARISON:  Ultrasound dated 05/12/2017 FINDINGS: Right Kidney: Length: 8.7 cm. There is mild increased echogenicity and parenchymal thinning. There is mild right hydronephrosis. There is a 1.0 x 0.7 x 1.0 cm hypoechoic lesion in the upper pole, likely a complex cyst, similar to prior ultrasound. Left Kidney: Length: 9.4 cm. Mild increased echogenicity. There is parenchymal atrophy and cortical thinning. There is a 4.2 x 3.2 x 2.7 cm inferior pole cyst. No hydronephrosis or echogenic stone. Bladder: Appears normal for degree of bladder distention. IMPRESSION: 1. Mild increased echogenicity and parenchymal atrophy. 2. Mild right hydronephrosis.  No obstructing stone. 3. Left renal cyst.  Probable right renal complex cyst. 4. Unremarkable urinary bladder. Electronically Signed   By: Anner Crete M.D.   On: 06/06/2017 01:26   US Renal  Result Date: 05/12/2017 CLINICAL DATA:  76 year old hypertensive diabetic female with chronic kidney disease. Subsequent encounter. EXAM: RENAL / URINARY TRACT ULTRASOUND COMPLETE COMPARISON:  11/17/2016 ultrasound. FINDINGS: Right Kidney: Length: 7.8 cm. No hydronephrosis. Increase echogenicity. 8 mm right upper pole cyst. Left Kidney: Length: 8.7 cm. No hydronephrosis. Increased echogenicity. Lower pole cyst measuring up to 4.5 cm. Bowel gas slightly limits  evaluation. Bladder: Appears normal for degree of bladder distention. IMPRESSION: Progressive increased echogenicity of renal parenchyma consistent with medical renal disease changes. No hydronephrosis. 8 mm right upper pole renal cyst and left lower pole 4.5 cm cyst. Electronically Signed   By: Genia Del M.D.   On: 05/12/2017 15:55   US Breast Ltd Uni Left Inc Axilla  Result Date: 06/05/2017 CLINICAL DATA:  Recall from screening mammography with tomosynthesis, possible mass or focal asymmetry in the upper inner subareolar left breast. EXAM: 2D DIGITAL DIAGNOSTIC LEFT MAMMOGRAM WITH ADJUNCT TOMO ULTRASOUND LEFT BREAST COMPARISON:  Mammography 06/01/2017, 05/29/2016 and earlier. Left breast ultrasound 03/24/2011 was performed in a different part of the breast. ACR Breast Density Category d: The breast tissue is extremely dense, which lowers the sensitivity of mammography. FINDINGS: Standard and tomosynthesis spot compression CC and MLO views of the area of concern in the left breast were obtained. The spot compression MLO views demonstrates a circumscribed oval medium density mass measuring approximately 6 mm without associated architectural distortion or suspicious calcification. This is not visualized on the spot compression CC view. On physical exam, there is no palpable abnormality in the upper inner periareolar left breast. Targeted left breast ultrasound is performed, showing a possible intraductal mass at the 10 o'clock position approximately 1 cm from the nipple measuring approximately 3 x 7 x 7 mm. There is no internal power Doppler flow. Sonographic evaluation the left axilla demonstrates no pathologic lymphadenopathy. IMPRESSION: 1. Approximate 7 mm possible intraductal mass in the upper inner subareolar left breast which accounts for the area of concern on screening mammography. 2. No pathologic left axillary lymphadenopathy. RECOMMENDATION: Ultrasound-guided core needle biopsy of the possible  intraductal mass in the left breast in  order to exclude a high risk papilloma or DCIS. The ultrasound biopsy procedure was discussed with the patient and her questions were answered. She has agreed to proceed and the biopsy has been scheduled for Friday, August 10 at 3:45 p.m. I have discussed the findings and recommendations with the patient. Results were also provided in writing at the conclusion of the visit. BI-RADS CATEGORY  4: Suspicious. Electronically Signed   By: Evangeline Dakin M.D.   On: 06/05/2017 16:37   Mm Diag Breast Tomo Uni Left  Result Date: 06/05/2017 CLINICAL DATA:  Recall from screening mammography with tomosynthesis, possible mass or focal asymmetry in the upper inner subareolar left breast. EXAM: 2D DIGITAL DIAGNOSTIC LEFT MAMMOGRAM WITH ADJUNCT TOMO ULTRASOUND LEFT BREAST COMPARISON:  Mammography 06/01/2017, 05/29/2016 and earlier. Left breast ultrasound 03/24/2011 was performed in a different part of the breast. ACR Breast Density Category d: The breast tissue is extremely dense, which lowers the sensitivity of mammography. FINDINGS: Standard and tomosynthesis spot compression CC and MLO views of the area of concern in the left breast were obtained. The spot compression MLO views demonstrates a circumscribed oval medium density mass measuring approximately 6 mm without associated architectural distortion or suspicious calcification. This is not visualized on the spot compression CC view. On physical exam, there is no palpable abnormality in the upper inner periareolar left breast. Targeted left breast ultrasound is performed, showing a possible intraductal mass at the 10 o'clock position approximately 1 cm from the nipple measuring approximately 3 x 7 x 7 mm. There is no internal power Doppler flow. Sonographic evaluation the left axilla demonstrates no pathologic lymphadenopathy. IMPRESSION: 1. Approximate 7 mm possible intraductal mass in the upper inner subareolar left breast which  accounts for the area of concern on screening mammography. 2. No pathologic left axillary lymphadenopathy. RECOMMENDATION: Ultrasound-guided core needle biopsy of the possible intraductal mass in the left breast in order to exclude a high risk papilloma or DCIS. The ultrasound biopsy procedure was discussed with the patient and her questions were answered. She has agreed to proceed and the biopsy has been scheduled for Friday, August 10 at 3:45 p.m. I have discussed the findings and recommendations with the patient. Results were also provided in writing at the conclusion of the visit. BI-RADS CATEGORY  4: Suspicious. Electronically Signed   By: Evangeline Dakin M.D.   On: 06/05/2017 16:37   Mm Screening Breast Tomo Bilateral  Result Date: 06/02/2017 CLINICAL DATA:  Screening. EXAM: 2D DIGITAL SCREENING BILATERAL MAMMOGRAM WITH CAD AND ADJUNCT TOMO COMPARISON:  Previous exam(s). ACR Breast Density Category c: The breast tissue is heterogeneously dense, which may obscure small masses. FINDINGS: In the left breast, a possible asymmetry warrants further evaluation. In the right breast, no findings suspicious for malignancy. Images were processed with CAD. IMPRESSION: Further evaluation is suggested for possible asymmetry in the left breast. RECOMMENDATION: Diagnostic mammogram and possibly ultrasound of the left breast. (Code:[id number]) The patient will be contacted regarding the findings, and additional imaging will be scheduled. BI-RADS CATEGORY  0: Incomplete. Need additional imaging evaluation and/or prior mammograms for comparison. Electronically Signed   By: Fidela Salisbury M.D.   On: 06/02/2017 11:08    Lab Data:  CBC:  Recent Labs Lab 06/05/17 2234 06/05/17 2320 06/06/17 0139 06/07/17 0419  WBC 15.5*  --  17.6* 17.6*  NEUTROABS  --  11.9* 12.1*  --   HGB 11.2*  --  10.5* 11.3*  HCT 34.6*  --  32.7* 36.4  MCV 101.2*  --  100.6* 101.7*  PLT 192  --  162 263   Basic Metabolic  Panel:  Recent Labs Lab 06/05/17 2234 06/06/17 0139 06/07/17 0419  NA 136 139 140  K 4.6 4.4 3.7  CL 97* 105 101  CO2 27 25 23   GLUCOSE 167* 239* 189*  BUN 41* 37* 38*  CREATININE 2.10* 1.88* 2.19*  CALCIUM 9.0 7.8* 8.8*   GFR: Estimated Creatinine Clearance: 19.7 mL/min (A) (by C-G formula based on SCr of 2.19 mg/dL (H)). Liver Function Tests:  Recent Labs Lab 06/05/17 2234  AST 27  ALT 13*  ALKPHOS 116  BILITOT 0.7  PROT 6.7  ALBUMIN 3.1*   No results for input(s): LIPASE, AMYLASE in the last 168 hours. No results for input(s): AMMONIA in the last 168 hours. Coagulation Profile:  Recent Labs Lab 06/05/17 2234  INR 0.90   Cardiac Enzymes: No results for input(s): CKTOTAL, CKMB, CKMBINDEX, TROPONINI in the last 168 hours. BNP (last 3 results) No results for input(s): PROBNP in the last 8760 hours. HbA1C:  Recent Labs  06/05/17 2234  HGBA1C 7.3*   CBG:  Recent Labs Lab 06/06/17 1605 06/06/17 2357 06/07/17 0413 06/07/17 0731 06/07/17 1200  GLUCAP 155* 221* 192* 96 239*   Lipid Profile: No results for input(s): CHOL, HDL, LDLCALC, TRIG, CHOLHDL, LDLDIRECT in the last 72 hours. Thyroid Function Tests: No results for input(s): TSH, T4TOTAL, FREET4, T3FREE, THYROIDAB in the last 72 hours. Anemia Panel: No results for input(s): VITAMINB12, FOLATE, FERRITIN, TIBC, IRON, RETICCTPCT in the last 72 hours. Urine analysis:    Component Value Date/Time   COLORURINE STRAW (A) 06/05/2017 2301   APPEARANCEUR CLEAR 06/05/2017 2301   LABSPEC 1.011 06/05/2017 2301   PHURINE 8.0 06/05/2017 2301   GLUCOSEU NEGATIVE 06/05/2017 2301   HGBUR NEGATIVE 06/05/2017 2301   BILIRUBINUR NEGATIVE 06/05/2017 2301   BILIRUBINUR neg 08/16/2012 1531   KETONESUR NEGATIVE 06/05/2017 2301   PROTEINUR 30 (A) 06/05/2017 2301   UROBILINOGEN 0.2 06/25/2013 1002   NITRITE NEGATIVE 06/05/2017 2301   LEUKOCYTESUR NEGATIVE 06/05/2017 [place]  M.D. [phone] [facility] 06/07/2017, 12:36 PM  Pager: [phone] Between 7am to 7pm - call Pager - [phone]  After 7pm go to [email or web address] - password [email or web address]  Call night coverage person covering after 7pm

## 2017-06-08 LAB — CBC
HEMATOCRIT: 34.2 % — AB (ref 36.0–46.0)
Hemoglobin: 11 g/dL — ABNORMAL LOW (ref 12.0–15.0)
MCH: 31.8 pg (ref 26.0–34.0)
MCHC: 32.2 g/dL (ref 30.0–36.0)
MCV: 98.8 fL (ref 78.0–100.0)
Platelets: 217 10*3/uL (ref 150–400)
RBC: 3.46 MIL/uL — ABNORMAL LOW (ref 3.87–5.11)
RDW: 13.1 % (ref 11.5–15.5)
WBC: 15.4 10*3/uL — AB (ref 4.0–10.5)

## 2017-06-08 LAB — BASIC METABOLIC PANEL
Anion gap: 9 (ref 5–15)
BUN: 35 mg/dL — AB (ref 6–20)
CALCIUM: 8.8 mg/dL — AB (ref 8.9–10.3)
CO2: 28 mmol/L (ref 22–32)
CREATININE: 1.93 mg/dL — AB (ref 0.44–1.00)
Chloride: 102 mmol/L (ref 101–111)
GFR calc non Af Amer: 24 mL/min — ABNORMAL LOW (ref >60)
GFR, EST AFRICAN AMERICAN: 28 mL/min — AB (ref >60)
Glucose, Bld: 178 mg/dL — ABNORMAL HIGH (ref 65–99)
Potassium: 3.8 mmol/L (ref 3.5–5.1)
Sodium: 139 mmol/L (ref 135–145)

## 2017-06-08 LAB — GLUCOSE, CAPILLARY
GLUCOSE-CAPILLARY: 172 mg/dL — AB (ref 65–99)
GLUCOSE-CAPILLARY: 222 mg/dL — AB (ref 65–99)
GLUCOSE-CAPILLARY: 242 mg/dL — AB (ref 65–99)
GLUCOSE-CAPILLARY: 260 mg/dL — AB (ref 65–99)
Glucose-Capillary: 123 mg/dL — ABNORMAL HIGH (ref 65–99)
Glucose-Capillary: 148 mg/dL — ABNORMAL HIGH (ref 65–99)
Glucose-Capillary: 236 mg/dL — ABNORMAL HIGH (ref 65–99)
Glucose-Capillary: 243 mg/dL — ABNORMAL HIGH (ref 65–99)

## 2017-06-08 LAB — CULTURE, BLOOD (ROUTINE X 2): Special Requests: ADEQUATE

## 2017-06-08 LAB — HEMOGLOBIN A1C
Hgb A1c MFr Bld: 7.6 % — ABNORMAL HIGH (ref 4.8–5.6)
Mean Plasma Glucose: 171 mg/dL

## 2017-06-08 MED ORDER — CEPHALEXIN 500 MG PO CAPS
500.0000 mg | ORAL_CAPSULE | Freq: Two times a day (BID) | ORAL | Status: DC
  Administered 2017-06-08 – 2017-06-11 (×6): 500 mg via ORAL
  Filled 2017-06-08 (×6): qty 1

## 2017-06-08 MED ORDER — POLYETHYLENE GLYCOL 3350 17 G PO PACK
17.0000 g | PACK | Freq: Every day | ORAL | Status: DC
  Administered 2017-06-10: 17 g via ORAL
  Filled 2017-06-08: qty 1

## 2017-06-08 MED ORDER — INSULIN GLARGINE 100 UNIT/ML ~~LOC~~ SOLN
5.0000 [IU] | Freq: Every day | SUBCUTANEOUS | Status: DC
  Administered 2017-06-08 – 2017-06-10 (×3): 5 [IU] via SUBCUTANEOUS
  Filled 2017-06-08 (×4): qty 0.05

## 2017-06-08 NOTE — Plan of Care (Signed)
Problem: Health Behavior/Discharge Planning: Goal: Ability to manage health-related needs will improve Outcome: Progressing Patient needing help with eating due to hand tremors. Will follow up with MD about possible need for OT evaluation

## 2017-06-08 NOTE — Progress Notes (Signed)
Brief summary   76 y.o.  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   AKI (acute kidney injury) ([name] [name])  Escherichia coli sepsis: Blood culture positive for Escherichia coli - final culture report pending Lactic acid level normalized Changed Zosyn to ceftriaxone, 06/06/2017--change to PO Keflex overnight 8/6 and observe--if no fever can d/c with 14 days Rx D/c IVF 8.6 Supportive care  acute on chronic kidney disease/AKI: Baseline creatinine around 1.6-1.7 Prerenal due to dehydration  Monitor renal function with electrolytes Renal ultrasound 06/06/2017 -mild right hydronephrosis, NEW  right hydronephrosis: New Mild - but with upward Cr  Follow urine culture-neg Consider urology consult if renal function is not improved Outpatient follow-up  acute encephalopathy Due to infectious etiology with superimposed metabolic etiology Supportive care   insulin-dependent diabetes mellitus ty II  A1c 06/05/17- 7.3% Sliding-scale insulin shows sugars 148--242 Adding  lantus 5 U   hypertension: Was initially elevated in the ED Continue antihypertensive regimen-Cardizem 90 bid, Metoprolol 25 bid When necessary IV hydralazine  chronic diastolic CHF: TTE, 70/17/7939-QZ 6500% with grade 1 diastolic dysfunction moderate TR, mild MR Hold Lasix for now  Monitor I's/O's with daily weights  Decubitus ulcers Turn q 2hr    Code Status: Full code DVT Prophylaxis:  heparin Family Communication: called spouse and updated Disposition Plan: To be determined  Time Spent in minutes   25 minutes  Procedures:    Consultants:     Antimicrobials:   IV vancomycin and Zosyn -discontinued 06/06/2017 IV ceftriaxone-started 06/06/2017  Medications  Scheduled Meds: . aspirin EC  81 mg Oral Daily  . atorvastatin  10 mg Oral q1800  . cephALEXin  500 mg Oral Q12H  . diltiazem  90 mg Oral BID  . divalproex  750 mg Oral Daily  . heparin  5,000 Units Subcutaneous Q8H  . insulin aspart  0-15 Units Subcutaneous Q4H  . insulin glargine  5 Units Subcutaneous QHS  . magnesium oxide  400 mg Oral BID  . metoprolol tartrate  25 mg Oral BID  . multivitamin with minerals  1 tablet Oral Daily  . pantoprazole  40 mg Oral Daily  . polyethylene glycol  17 g Oral Daily   Continuous Infusions: . sodium chloride 75 mL/hr at 06/08/17 0123  . cefTRIAXone (ROCEPHIN)  IV Stopped (06/07/17 2123)  . sodium chloride     PRN Meds:.acetaminophen **OR** acetaminophen, hydrALAZINE, morphine injection, ondansetron **OR** ondansetron (ZOFRAN) IV   Antibiotics     Subjective:   Awake but confused Thinks this is 1950 No cp Eating only fair  Objective:  Vitals:   06/07/17 1803 06/07/17 2116 06/07/17 2300 06/08/17 0411  BP: (!) 135/50 140/77  (!) 156/79  Pulse: 89 (!) 106  (!) 111  Resp: 18 18  18   Temp: 98.7 F (37.1 C) 99.3 F (37.4 C)  99.5 F (37.5 C)  TempSrc: Oral Oral  Oral  SpO2: 98% 96%  96%  Weight:   67.4 kg (148 lb 9.4 oz)   Height:         Intake/Output Summary (Last 24 hours) at 06/08/17 0825 Last data filed at 06/08/17 0600  Gross per 24 hour  Intake           1922.5 ml  Output             2100 ml  Net           -177.5 ml   Wt Readings from Last 3 Encounters:  06/07/17 67.4 kg (148 lb 9.4 oz)  11/21/16 66 kg (145 lb 8.1 oz)  08/27/16 64.6 kg (142 lb 6.4 oz)   Exam  eomi ncat Pallor slight no jaundice cta b abd soft nt nd no rebound buttocks has some rash and mild decubitus Resting tremor  Data Reviewed:  I have personally reviewed following labs and imaging studies  Micro Results Recent Results (from the past 240 hour(s))  Culture, blood (Routine x 2)     Status: Abnormal (Preliminary result)   Collection Time: 06/05/17 10:35 PM  Result Value Ref Range Status   Specimen Description BLOOD RIGHT ANTECUBITAL  Final   Special Requests   Final    BOTTLES DRAWN AEROBIC AND ANAEROBIC Blood Culture adequate volume   Culture  Setup Time   Final    GRAM NEGATIVE RODS IN BOTH AEROBIC AND ANAEROBIC BOTTLES CRITICAL RESULT CALLED TO, READ BACK BY AND VERIFIED WITH: R RUMBARGER,PHARMD AT 1149 06/06/17 BY L BENFIELD    Culture ESCHERICHIA COLI SUSCEPTIBILITIES TO FOLLOW  (A)  Final   Report Status PENDING  Incomplete  Blood Culture ID Panel (Reflexed)     Status: Abnormal   Collection Time: 06/05/17 10:35 PM  Result Value Ref Range Status   Enterococcus species NOT DETECTED NOT DETECTED Final   Listeria monocytogenes NOT DETECTED NOT DETECTED Final   Staphylococcus species NOT DETECTED NOT DETECTED Final   Staphylococcus aureus NOT DETECTED NOT DETECTED Final   Streptococcus species NOT DETECTED NOT DETECTED Final   Streptococcus agalactiae NOT DETECTED NOT DETECTED Final   Streptococcus pneumoniae NOT DETECTED NOT DETECTED Final   Streptococcus pyogenes NOT DETECTED NOT DETECTED Final   Acinetobacter baumannii NOT DETECTED NOT DETECTED Final   Enterobacteriaceae species DETECTED (A) NOT DETECTED Final     Comment: Enterobacteriaceae represent a large family of gram-negative bacteria, not a single organism. CRITICAL RESULT CALLED TO, READ BACK BY AND VERIFIED WITH: R RUMBARGER,PHARMD AT 1149 06/06/17 BY L BENFIELD    Enterobacter cloacae complex NOT DETECTED NOT DETECTED Final   Escherichia coli DETECTED (A) NOT DETECTED Final    Comment: CRITICAL RESULT CALLED TO, READ BACK BY AND VERIFIED WITH: R RUMBARGER,PHARMD AT 1149 06/06/17 BY L BENFIELD    Klebsiella oxytoca NOT DETECTED NOT DETECTED Final   Klebsiella pneumoniae NOT DETECTED NOT DETECTED Final   Proteus species NOT DETECTED NOT DETECTED Final   Serratia marcescens NOT DETECTED NOT DETECTED Final   Carbapenem resistance NOT DETECTED NOT DETECTED Final   Haemophilus influenzae NOT DETECTED NOT DETECTED Final   Neisseria meningitidis NOT DETECTED NOT DETECTED Final   Pseudomonas aeruginosa  NOT DETECTED NOT DETECTED Final   Candida albicans NOT DETECTED NOT DETECTED Final   Candida glabrata NOT DETECTED NOT DETECTED Final   Candida krusei NOT DETECTED NOT DETECTED Final   Candida parapsilosis NOT DETECTED NOT DETECTED Final   Candida tropicalis NOT DETECTED NOT DETECTED Final  Culture, blood (Routine x 2)     Status: None (Preliminary result)   Collection Time: 06/05/17 10:47 PM  Result Value Ref Range Status   Specimen Description BLOOD LEFT FOREARM  Final   Special Requests IN PEDIATRIC BOTTLE Blood Culture adequate volume  Final   Culture NO GROWTH 2 DAYS  Final   Report Status PENDING  Incomplete  Urine culture     Status: Abnormal   Collection Time: 06/05/17 11:03 PM  Result Value Ref Range Status   Specimen Description URINE, CATHETERIZED  Final   Special Requests NONE  Final   Culture <10,000 COLONIES/mL INSIGNIFICANT GROWTH (A)  Final   Report Status 06/07/2017 FINAL  Final    Radiology Reports Dg Chest 2 View  Result Date: 06/05/2017 CLINICAL DATA:  Fever, weakness for 2 days. History of stroke, hypertension,  diabetes. EXAM: CHEST  2 VIEW COMPARISON:  Chest x-ray dated 11/15/2016. FINDINGS: Heart size and mediastinal contours are within normal limits. Atherosclerotic changes noted at the aortic arch. Lungs are clear. No pleural effusion or pneumothorax seen. No acute or suspicious osseous finding. Calcifications about both shoulders suggests chronic calcific tendinopathy. IMPRESSION: No active cardiopulmonary disease. No evidence of pneumonia or pulmonary edema. Aortic atherosclerosis. Electronically Signed   By: Franki Cabot M.D.   On: 06/05/2017 23:39   US Renal  Result Date: 06/06/2017 CLINICAL DATA:  76 year old female with acute renal insufficiency, altered mental status, and fever. EXAM: RENAL / URINARY TRACT ULTRASOUND COMPLETE COMPARISON:  Ultrasound dated 05/12/2017 FINDINGS: Right Kidney: Length: 8.7 cm. There is mild increased echogenicity and parenchymal thinning. There is mild right hydronephrosis. There is a 1.0 x 0.7 x 1.0 cm hypoechoic lesion in the upper pole, likely a complex cyst, similar to prior ultrasound. Left Kidney: Length: 9.4 cm. Mild increased echogenicity. There is parenchymal atrophy and cortical thinning. There is a 4.2 x 3.2 x 2.7 cm inferior pole cyst. No hydronephrosis or echogenic stone. Bladder: Appears normal for degree of bladder distention. IMPRESSION: 1. Mild increased echogenicity and parenchymal atrophy. 2. Mild right hydronephrosis.  No obstructing stone. 3. Left renal cyst.  Probable right renal complex cyst. 4. Unremarkable urinary bladder. Electronically Signed   By: Anner Crete M.D.   On: 06/06/2017 01:26   US Renal  Result Date: 05/12/2017 CLINICAL DATA:  76 year old hypertensive diabetic female with chronic kidney disease. Subsequent encounter. EXAM: RENAL / URINARY TRACT ULTRASOUND COMPLETE COMPARISON:  11/17/2016 ultrasound. FINDINGS: Right Kidney: Length: 7.8 cm. No hydronephrosis. Increase echogenicity. 8 mm right upper pole cyst. Left Kidney: Length: 8.7  cm. No hydronephrosis. Increased echogenicity. Lower pole cyst measuring up to 4.5 cm. Bowel gas slightly limits evaluation. Bladder: Appears normal for degree of bladder distention. IMPRESSION: Progressive increased echogenicity of renal parenchyma consistent with medical renal disease changes. No hydronephrosis. 8 mm right upper pole renal cyst and left lower pole 4.5 cm cyst. Electronically Signed   By: Genia Del M.D.   On: 05/12/2017 15:55   US Breast Ltd Uni Left Inc Axilla  Result Date: 06/05/2017 CLINICAL DATA:  Recall from screening mammography with tomosynthesis, possible mass or focal asymmetry in the upper inner subareolar left breast. EXAM: 2D DIGITAL DIAGNOSTIC LEFT MAMMOGRAM WITH ADJUNCT  TOMO ULTRASOUND LEFT BREAST COMPARISON:  Mammography 06/01/2017, 05/29/2016 and earlier. Left breast ultrasound 03/24/2011 was performed in a different part of the breast. ACR Breast Density Category d: The breast tissue is extremely dense, which lowers the sensitivity of mammography. FINDINGS: Standard and tomosynthesis spot compression CC and MLO views of the area of concern in the left breast were obtained. The spot compression MLO views demonstrates a circumscribed oval medium density mass measuring approximately 6 mm without associated architectural distortion or suspicious calcification. This is not visualized on the spot compression CC view. On physical exam, there is no palpable abnormality in the upper inner periareolar left breast. Targeted left breast ultrasound is performed, showing a possible intraductal mass at the 10 o'clock position approximately 1 cm from the nipple measuring approximately 3 x 7 x 7 mm. There is no internal power Doppler flow. Sonographic evaluation the left axilla demonstrates no pathologic lymphadenopathy. IMPRESSION: 1. Approximate 7 mm possible intraductal mass in the upper inner subareolar left breast which accounts for the area of concern on screening mammography. 2. No  pathologic left axillary lymphadenopathy. RECOMMENDATION: Ultrasound-guided core needle biopsy of the possible intraductal mass in the left breast in order to exclude a high risk papilloma or DCIS. The ultrasound biopsy procedure was discussed with the patient and her questions were answered. She has agreed to proceed and the biopsy has been scheduled for Friday, August 10 at 3:45 p.m. I have discussed the findings and recommendations with the patient. Results were also provided in writing at the conclusion of the visit. BI-RADS CATEGORY  4: Suspicious. Electronically Signed   By: Evangeline Dakin M.D.   On: 06/05/2017 16:37   Mm Diag Breast Tomo Uni Left  Result Date: 06/05/2017 CLINICAL DATA:  Recall from screening mammography with tomosynthesis, possible mass or focal asymmetry in the upper inner subareolar left breast. EXAM: 2D DIGITAL DIAGNOSTIC LEFT MAMMOGRAM WITH ADJUNCT TOMO ULTRASOUND LEFT BREAST COMPARISON:  Mammography 06/01/2017, 05/29/2016 and earlier. Left breast ultrasound 03/24/2011 was performed in a different part of the breast. ACR Breast Density Category d: The breast tissue is extremely dense, which lowers the sensitivity of mammography. FINDINGS: Standard and tomosynthesis spot compression CC and MLO views of the area of concern in the left breast were obtained. The spot compression MLO views demonstrates a circumscribed oval medium density mass measuring approximately 6 mm without associated architectural distortion or suspicious calcification. This is not visualized on the spot compression CC view. On physical exam, there is no palpable abnormality in the upper inner periareolar left breast. Targeted left breast ultrasound is performed, showing a possible intraductal mass at the 10 o'clock position approximately 1 cm from the nipple measuring approximately 3 x 7 x 7 mm. There is no internal power Doppler flow. Sonographic evaluation the left axilla demonstrates no pathologic  lymphadenopathy. IMPRESSION: 1. Approximate 7 mm possible intraductal mass in the upper inner subareolar left breast which accounts for the area of concern on screening mammography. 2. No pathologic left axillary lymphadenopathy. RECOMMENDATION: Ultrasound-guided core needle biopsy of the possible intraductal mass in the left breast in order to exclude a high risk papilloma or DCIS. The ultrasound biopsy procedure was discussed with the patient and her questions were answered. She has agreed to proceed and the biopsy has been scheduled for Friday, August 10 at 3:45 p.m. I have discussed the findings and recommendations with the patient. Results were also provided in writing at the conclusion of the visit. BI-RADS CATEGORY  4: Suspicious. Electronically Signed  By: Evangeline Dakin M.D.   On: 06/05/2017 16:37   Mm Screening Breast Tomo Bilateral  Result Date: 06/02/2017 CLINICAL DATA:  Screening. EXAM: 2D DIGITAL SCREENING BILATERAL MAMMOGRAM WITH CAD AND ADJUNCT TOMO COMPARISON:  Previous exam(s). ACR Breast Density Category c: The breast tissue is heterogeneously dense, which may obscure small masses. FINDINGS: In the left breast, a possible asymmetry warrants further evaluation. In the right breast, no findings suspicious for malignancy. Images were processed with CAD. IMPRESSION: Further evaluation is suggested for possible asymmetry in the left breast. RECOMMENDATION: Diagnostic mammogram and possibly ultrasound of the left breast. (Code:[id number]) The patient will be contacted regarding the findings, and additional imaging will be scheduled. BI-RADS CATEGORY  0: Incomplete. Need additional imaging evaluation and/or prior mammograms for comparison. Electronically Signed   By: Fidela Salisbury M.D.   On: 06/02/2017 11:08    Lab Data:  CBC:  Recent Labs Lab 06/05/17 2234 06/05/17 2320 06/06/17 0139 06/07/17 0419 06/08/17 0500  WBC 15.5*  --  17.6* 17.6* 15.4*  NEUTROABS  --  11.9* 12.1*  --    --   HGB 11.2*  --  10.5* 11.3* 11.0*  HCT 34.6*  --  32.7* 36.4 34.2*  MCV 101.2*  --  100.6* 101.7* 98.8  PLT 192  --  162 195 093   Basic Metabolic Panel:  Recent Labs Lab 06/05/17 2234 06/06/17 0139 06/07/17 0419 06/08/17 0500  NA 136 139 140 139  K 4.6 4.4 3.7 3.8  CL 97* 105 101 102  CO2 27 25 23 28   GLUCOSE 167* 239* 189* 178*  BUN 41* 37* 38* 35*  CREATININE 2.10* 1.88* 2.19* 1.93*  CALCIUM 9.0 7.8* 8.8* 8.8*   GFR: Estimated Creatinine Clearance: 22.3 mL/min (A) (by C-G formula based on SCr of 1.93 mg/dL (H)). Liver Function Tests:  Recent Labs Lab 06/05/17 2234  AST 27  ALT 13*  ALKPHOS 116  BILITOT 0.7  PROT 6.7  ALBUMIN 3.1*   No results for input(s): LIPASE, AMYLASE in the last 168 hours. No results for input(s): AMMONIA in the last 168 hours. Coagulation Profile:  Recent Labs Lab 06/05/17 2234  INR 0.90   Cardiac Enzymes: No results for input(s): CKTOTAL, CKMB, CKMBINDEX, TROPONINI in the last 168 hours. BNP (last 3 results) No results for input(s): PROBNP in the last 8760 hours. HbA1C:  Recent Labs  06/05/17 2234  HGBA1C 7.3*   CBG:  Recent Labs Lab 06/07/17 1613 06/07/17 2021 06/08/17 0015 06/08/17 0403 06/08/17 0743  GLUCAP 172* 323* 243* 172* 148*   Lipid Profile: No results for input(s): CHOL, HDL, LDLCALC, TRIG, CHOLHDL, LDLDIRECT in the last 72 hours. Thyroid Function Tests: No results for input(s): TSH, T4TOTAL, FREET4, T3FREE, THYROIDAB in the last 72 hours. Anemia Panel: No results for input(s): VITAMINB12, FOLATE, FERRITIN, TIBC, IRON, RETICCTPCT in the last 72 hours. Urine analysis:    Component Value Date/Time   COLORURINE STRAW (A) 06/05/2017 2301   APPEARANCEUR CLEAR 06/05/2017 2301   LABSPEC 1.011 06/05/2017 2301   PHURINE 8.0 06/05/2017 2301   GLUCOSEU NEGATIVE 06/05/2017 2301   HGBUR NEGATIVE 06/05/2017 2301   BILIRUBINUR NEGATIVE 06/05/2017 2301   BILIRUBINUR neg 08/16/2012 1531   KETONESUR NEGATIVE  06/05/2017 2301   PROTEINUR 30 (A) 06/05/2017 2301   UROBILINOGEN 0.2 06/25/2013 1002   NITRITE NEGATIVE 06/05/2017 2301   LEUKOCYTESUR NEGATIVE 06/05/2017 2301     Verneita Griffes, MD [facility] ([facility]) [phone]

## 2017-06-09 LAB — BASIC METABOLIC PANEL
Anion gap: 11 (ref 5–15)
BUN: 31 mg/dL — ABNORMAL HIGH (ref 6–20)
CHLORIDE: 101 mmol/L (ref 101–111)
CO2: 26 mmol/L (ref 22–32)
CREATININE: 1.69 mg/dL — AB (ref 0.44–1.00)
Calcium: 8.5 mg/dL — ABNORMAL LOW (ref 8.9–10.3)
GFR, EST AFRICAN AMERICAN: 33 mL/min — AB (ref >60)
GFR, EST NON AFRICAN AMERICAN: 28 mL/min — AB (ref >60)
Glucose, Bld: 249 mg/dL — ABNORMAL HIGH (ref 65–99)
Potassium: 4 mmol/L (ref 3.5–5.1)
Sodium: 138 mmol/L (ref 135–145)

## 2017-06-09 LAB — GLUCOSE, CAPILLARY
GLUCOSE-CAPILLARY: 107 mg/dL — AB (ref 65–99)
GLUCOSE-CAPILLARY: 289 mg/dL — AB (ref 65–99)
GLUCOSE-CAPILLARY: 232 mg/dL — AB (ref 65–99)
Glucose-Capillary: 153 mg/dL — ABNORMAL HIGH (ref 65–99)
Glucose-Capillary: 257 mg/dL — ABNORMAL HIGH (ref 65–99)
Glucose-Capillary: 81 mg/dL (ref 65–99)

## 2017-06-09 MED ORDER — DILTIAZEM HCL 60 MG PO TABS
60.0000 mg | ORAL_TABLET | Freq: Two times a day (BID) | ORAL | Status: DC
  Administered 2017-06-09 – 2017-06-11 (×4): 60 mg via ORAL
  Filled 2017-06-09 (×4): qty 1

## 2017-06-09 MED ORDER — METOPROLOL TARTRATE 12.5 MG HALF TABLET: 12.5000 mg | ORAL_TABLET | Freq: Two times a day (BID) | ORAL | Status: DC

## 2017-06-09 MED ORDER — METOPROLOL SUCCINATE ER 25 MG PO TB24
25.0000 mg | ORAL_TABLET | Freq: Every day | ORAL | Status: DC
  Administered 2017-06-10 – 2017-06-11 (×2): 25 mg via ORAL
  Filled 2017-06-09 (×3): qty 1

## 2017-06-09 NOTE — Plan of Care (Signed)
Problem: Respiratory: Goal: Ability to maintain adequate ventilation will improve Outcome: Progressing Patient on room air and denies trouble with breathing.

## 2017-06-09 NOTE — Progress Notes (Signed)
Patient orthostatics noted and discussed with nursing-although patient did not feel dizzy she looked like she was brought asked that when orthostatics were being repeated I have changed her metoprolol twice a day to XL dosing and we will keep her at a lower dose of Cardizem 60 twice a day. If she is still orthostatic would discontinue Cardizem completely in place patient on clonidine 0.1 3 times a day blood pressure systolic >943  Verneita Griffes, MD [facility] [phone]

## 2017-06-09 NOTE — Evaluation (Signed)
Occupational Therapy Evaluation Patient Details Name: [name] [name] MRN: [id number] DOB: [birth year] Today's Date: 06/09/2017    History of Present Illness [name] [name] is a 76 y.o. female with medical history significant for chronic kidney disease stage III, insulin-dependent diabetes mellitus, hypertension, GERD, and history of stroke with motor and cognitive deficits, now presenting to the emergency department with chills and confusion. She reportedly been in her usual state until earlier this evening when, after complaining of chills, she was noted to be confused, having difficulty with basic questioning. Admitted to the medical-surgical unit for ongoing evaluation and management of SIRS with acute encephalopathy and AKI.    Clinical Impression   PTA, pt reports that she lived with her husband and was able to perform her BADLs. Pt reporting inconsistent PLOF, so unsure of accuracy. Pt requiring Mod A for self feeding and Max A to adjust in bed. Feel pt would benefit from further acute OT to facilitate safe dc and address self feeding. Recommend dc to post -acute rehab due to pt poor functional performance, weakness, and confusion to increase safety and independence with safety.    Follow Up Recommendations  SNF;Supervision/Assistance - 24 hour    Equipment Recommendations  3 in 1 bedside commode    Recommendations for Other Services PT consult     Precautions / Restrictions Precautions Precautions: Fall Restrictions Weight Bearing Restrictions: No      Mobility Bed Mobility Overal bed mobility: Needs Assistance Bed Mobility: Rolling;Sidelying to Sit;Supine to Sit;Sit to Supine Rolling: Mod assist Sidelying to sit: Mod assist Supine to sit: Mod assist Sit to supine: Mod assist   General bed mobility comments: Pt requires Max A to adjust and move towards HOB. Pt encouraged to participate by using RUE on bed rail  Transfers Overall transfer level: Needs assistance Equipment used:  Rolling walker (2 wheeled) Transfers: Sit to/from Stand Sit to Stand: Mod assist;Max assist;+2 safety/equipment         General transfer comment: Not attempted - focused session on self feeding    Balance Overall balance assessment: Needs assistance Sitting-balance support: Feet supported Sitting balance-Leahy Scale: Fair Sitting balance - Comments: Pt demonstrated posteror lean and weakness. Pt experienced complete loss of balance at end of session due to syncope.  Postural control: Posterior lean Standing balance support: Bilateral upper extremity supported Standing balance-Leahy Scale: Poor Standing balance comment: Pt limited by pain and fatigue and sat unprompted.                            ADL either performed or assessed with clinical judgement   ADL Overall ADL's : Needs assistance/impaired Eating/Feeding: Moderate assistance;Minimal assistance;Cueing for sequencing;Bed level Eating/Feeding Details (indicate cue type and reason): Focused session on self-feeding. Pt required hand-over hand A throughout feeding. Pt able to stab pre-cut sausage with minimal hand over hand to decrease tremors. Pt required hand-over hand whenever using spoon to scoop. Pt maintains loose grasp on utensils. She reports that they have tried weighted utensils before at home, but unsure of accuracy due to poor historian of PLOF info.  For cup use, pt able to hold cup with straw and bring to her mouth with both hands; required minimal hand over hand to decrease tremors. Grooming: Wash/dry hands;Bed level;Brushing hair;Minimal assistance Grooming Details (indicate cue type and reason): Pt demonstrating bilateral coordiantion while opening sanitary packet by ripping top of. Required Min A to open pack and pull out sanitary wipe. Pt required significant amount  of time due to tremors. Pt brushed her hair at bed level with set up.  Upper Body Bathing: Maximal assistance;Bed level   Lower Body Bathing:  Maximal assistance;Bed level   Upper Body Dressing : Moderate assistance;Bed level   Lower Body Dressing: Maximal assistance;Bed level                 General ADL Comments: Pt demonstrating decreased functional performance due to tremors and vision impairments.     Vision Baseline Vision/History: Wears glasses Wears Glasses: At all times Patient Visual Report: No change from baseline;Other (comment) (Pt reports that she has peripheral vision lose at baseline)       Perception     Praxis Praxis Praxis tested?: Deficits Deficits: Limb apraxia Praxis-Other Comments: Pt tends to over shoot or undershoot during functional tasks.    Pertinent Vitals/Pain Pain Assessment: Faces Pain Score: 8  Faces Pain Scale: Hurts even more (with movement) Pain Location: Bil LE and L shoulder with movement Pain Descriptors / Indicators: Grimacing;Discomfort Pain Intervention(s): Monitored during session;Limited activity within patient's tolerance;Repositioned;Patient requesting pain meds-RN notified     Hand Dominance Right   Extremity/Trunk Assessment Upper Extremity Assessment Upper Extremity Assessment: RUE deficits/detail;LUE deficits/detail RUE Deficits / Details: Tremors in RUE impacting coordination and performing of ADLs RUE Coordination: decreased fine motor LUE Deficits / Details: Pt with tremors in Bil UE. limited shoulder ROM. able to flex at shoulder 0-90 with compensatory leaning of trunk. Good composite grasp strength. LUE Coordination: decreased gross motor;decreased fine motor   Lower Extremity Assessment Lower Extremity Assessment: Defer to PT evaluation RLE Deficits / Details: Tremor present at rest, and increased pain. Tremor noted upon standing from sitting. RLE: Unable to fully assess due to pain LLE Deficits / Details: Tremor present at rest and upon standing       Communication Communication Communication: HOH   Cognition Arousal/Alertness:  Awake/alert Behavior During Therapy: WFL for tasks assessed/performed Overall Cognitive Status: No family/caregiver present to determine baseline cognitive functioning Area of Impairment: Attention;Following commands;Awareness;Problem solving                   Current Attention Level: Sustained   Following Commands: Follows one step commands with increased time   Awareness: Emergent Problem Solving: Slow processing;Requires verbal cues General Comments: Pt required vcs throughout session. Demonstrating decreased problem solving to select utensile to use in self feeding and manage food selection. Pt also demonstrated increased impulsivity as seen by eating too quickly and requiring Min VCs to wait.   General Comments  Family not in room to confirm PLOF and home set up    Exercises    Shoulder Instructions      Home Living Family/patient expects to be discharged to:: Private residence Living Arrangements: Spouse/significant other;Children (husband and daughter) Available Help at Discharge: Family;Available 24 hours/day Type of Home: House Home Access: Stairs to enter [employer] of Steps: 1 Entrance Stairs-Rails: None Home Layout: One level     Bathroom Shower/Tub: Teacher, early years/pre: Standard     Home Equipment: Environmental consultant - 2 wheels   Additional Comments: Pt poor historian and answering home set up and PLOF with conflicting information      Prior Functioning/Environment Level of Independence: Needs assistance  Gait / Transfers Assistance Needed: Pt reports that she has RW but does not use it for functional mobility ADL's / Homemaking Assistance Needed: Pt reports that she is able to perform her BADLs. Husband assists with feeding due to  tremors and getting in/out of tub   Comments: Per prior note from previous admission, pt requires Max A for ADLs        OT Problem List: Decreased strength;Decreased range of motion;Decreased activity  tolerance;Impaired balance (sitting and/or standing);Impaired vision/perception;Decreased safety awareness;Decreased knowledge of use of DME or AE;Decreased cognition;Decreased knowledge of precautions;Impaired UE functional use;Pain      OT Treatment/Interventions: Self-care/ADL training;Therapeutic exercise;Energy conservation;DME and/or AE instruction;Therapeutic activities;Patient/family education    OT Goals(Current goals can be found in the care plan section) Acute Rehab OT Goals Patient Stated Goal: unstated OT Goal Formulation: With patient Time For Goal Achievement: 06/23/17 Potential to Achieve Goals: Good ADL Goals Pt Will Perform Eating: with set-up;with supervision;with adaptive utensils;bed level;with assist to don/doff brace/orthosis Pt Will Perform Grooming: with min assist;sitting Pt Will Perform Upper Body Dressing: with min assist;sitting Pt Will Transfer to Toilet: with mod assist;ambulating;bedside commode Pt Will Perform Toileting - Clothing Manipulation and hygiene: with mod assist;sit to/from stand  OT Frequency: Min 2X/week   Barriers to D/C:            Co-evaluation              AM-PAC PT "6 Clicks" Daily Activity     Outcome Measure Help from another person eating meals?: A Lot Help from another person taking care of personal grooming?: A Lot Help from another person toileting, which includes using toliet, bedpan, or urinal?: A Lot Help from another person bathing (including washing, rinsing, drying)?: A Lot Help from another person to put on and taking off regular upper body clothing?: A Lot Help from another person to put on and taking off regular lower body clothing?: A Lot 6 Click Score: 12   End of Session Nurse Communication: Mobility status;Patient requests pain meds  Activity Tolerance: Patient tolerated treatment well;Patient limited by pain Patient left: in bed;with call bell/phone within reach  OT Visit Diagnosis: Muscle weakness  (generalized) (M62.81);Pain;Low vision, both eyes (H54.2) Pain - Right/Left:  (bilateral) Pain - part of body: Leg;Shoulder (BLE and L shoulder)                Time: 9373-4287 OT Time Calculation (min): 35 min Charges:  OT General Charges $OT Visit: 1 Procedure OT Evaluation $OT Eval Moderate Complexity: 1 Procedure OT Treatments $Self Care/Home Management : 8-22 mins G-Codes:     [place], OTR/L Acute Rehab Pager: [phone] Office: [place] 06/09/2017, 1:51 PM

## 2017-06-09 NOTE — Progress Notes (Signed)
   06/09/17 0900  Clinical Encounter Type  Visited With Patient  Visit Type Initial;Spiritual support  Referral From Nurse    Patient sleeping. Attempted to wake and didn't rouse patient. Renelda Kilian L. Volanda Napoleon, MDiv

## 2017-06-09 NOTE — Progress Notes (Signed)
Patient orthostatic with PT when they sat her up. Initial BP when sitting was 92/42 and HR 101. BP after standing for a minute was 74/37 and HR 97. Pt reported dizziness and had loss of consciousness for about two seconds after being sitting back down. Laid patient on back and placed in trendelenburg and BP increased to 112/46. Pt alert and responsive. MD notified.

## 2017-06-09 NOTE — Progress Notes (Signed)
Brief summary   76 y.o.  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   AKI (acute kidney injury) ([place])  Escherichia coli sepsis: Blood culture positive for Escherichia coli -pansens Lactic acid level normalized Changed Zosyn to ceftriaxone, 06/06/2017--change to PO Keflex overnight 8/6 D/c IVF 8.6 Supportive care  Severe orthosatsis and dizzyness 8/7  hypertension: Was initially elevated in the ED adjusted antihypertensive regimen-Cardizem 90 bid-->60 bid, Metoprolol 25-->12.5 bid 8.7 D/c when necessary IV hydralazine  rechekc orthostatics at 14:00--if positive will need to adjust further  She is not ready for d/c  acute on chronic kidney disease/AKI: right hydronephrosis: Baseline creatinine around 1.6-1.7 Prerenal due to dehydration  Renal ultrasound 06/06/2017 -mild right hydronephrosis, NE Consider urology consult if renal function is not improved Outpatient follow-up  acute encephalopathy Due to infectious etiology  with superimposed metabolic etiology Supportive care   insulin-dependent diabetes mellitus ty II  A1c 06/05/17- 7.3% Sliding-scale insulin shows sugars 81--249 D/c lantus 5 U-->give only 2 U Nurse tech reports only 25% of meal eaten this am   chronic diastolic CHF: TTE, 63/84/6659-DJ 6500% with grade 1 diastolic dysfunction moderate TR, mild MR Hold Lasix for now  Monitor I's/O's with daily weights  Decubitus ulcers Turn q 2hr    Code Status: Full code DVT Prophylaxis:  heparin Family Communication: called spouse and updated Disposition Plan: To be determined  Time Spent in minutes   25 minutes  Procedures:    Consultants:     Antimicrobials:   IV vancomycin and Zosyn -discontinued 06/06/2017 IV ceftriaxone-started 06/06/2017  Medications  Scheduled Meds: . aspirin EC  81 mg Oral Daily  . atorvastatin  10 mg Oral q1800  . cephALEXin  500 mg Oral Q12H  . diltiazem  60 mg Oral BID  . divalproex  750 mg Oral Daily  . heparin  5,000 Units Subcutaneous Q8H  . insulin aspart  0-15 Units Subcutaneous Q4H  . insulin glargine  5 Units Subcutaneous QHS  . magnesium oxide  400 mg Oral BID  . metoprolol tartrate  12.5 mg Oral BID  . multivitamin with minerals  1 tablet Oral Daily  . pantoprazole  40 mg Oral Daily  . polyethylene glycol  17 g Oral Daily   Continuous Infusions: . sodium chloride 75 mL/hr at 06/08/17 0123  . sodium chloride     PRN Meds:.acetaminophen **OR** acetaminophen, ondansetron **OR** ondansetron (ZOFRAN) IV   Antibiotics     Subjective:   Slight confused orthostatic into 70's this am Was symptomatic No new  isue eatign and drinking only a little No cp no n/v   Objective:   Vitals:   06/09/17 0903 06/09/17 1104 06/09/17 1106 06/09/17 1110  BP: (!) 147/58 (!) 85/18 (!) 90/42 (!) 95/35  Pulse: (!) 115 (!) 104 99 (!) 101  Resp: 18     Temp: 99.4 F (37.4 C)     TempSrc: Oral     SpO2: 98%     Weight:      Height:         Intake/Output Summary (Last 24 hours) at 06/09/17 1225 Last data filed at 06/09/17 1013  Gross per 24 hour  Intake             2240 ml  Output             1751 ml  Net              489 ml   Wt Readings from Last 3 Encounters:  06/08/17 66.7 kg (147 lb 0.8 oz)  11/21/16 66 kg (145 lb 8.1 oz)  08/27/16 64.6 kg (142 lb 6.4 oz)   Exam  Awake alert fine tremor Chest clear no added sound abd soft nt slight distension no rebound no guard  s1 s2 no m/r/g No le edema No rash   Data Reviewed:  I have personally reviewed following labs and imaging studies  Micro Results  Radiology Reports Dg Chest 2 View  Result Date: 06/05/2017 CLINICAL DATA:  Fever, weakness for 2 days. History of stroke, hypertension, diabetes. EXAM: CHEST  2 VIEW COMPARISON:  Chest x-ray dated 11/15/2016. FINDINGS: Heart size and mediastinal contours are within normal limits. Atherosclerotic changes noted at the aortic arch. Lungs are clear. No pleural effusion or pneumothorax seen. No acute or suspicious osseous finding. Calcifications about both shoulders suggests chronic calcific tendinopathy. IMPRESSION: No active cardiopulmonary disease. No evidence of pneumonia or pulmonary edema. Aortic atherosclerosis. Electronically Signed   By: Franki Cabot M.D.   On: 06/05/2017 23:39   US Renal  Result Date: 06/06/2017 CLINICAL DATA:  76 year old female with acute renal insufficiency, altered mental status, and fever. EXAM: RENAL / URINARY TRACT ULTRASOUND COMPLETE COMPARISON:  Ultrasound dated 05/12/2017 FINDINGS: Right Kidney: Length: 8.7 cm. There is mild increased echogenicity and parenchymal thinning. There is mild right hydronephrosis. There is a 1.0 x 0.7 x 1.0 cm hypoechoic lesion in the upper pole, likely a complex cyst, similar to prior ultrasound. Left Kidney: Length: 9.4 cm. Mild increased echogenicity. There is parenchymal atrophy and cortical thinning. There is a 4.2 x 3.2 x 2.7 cm inferior pole cyst. No  hydronephrosis or echogenic stone. Bladder: Appears normal for degree of bladder distention. IMPRESSION: 1. Mild increased echogenicity and parenchymal atrophy. 2. Mild right hydronephrosis.  No obstructing stone. 3. Left renal cyst.  Probable right renal complex cyst. 4. Unremarkable urinary bladder. Electronically Signed   By: Anner Crete M.D.   On: 06/06/2017 01:26   US Renal  Result Date: 05/12/2017 CLINICAL DATA:  76 year old hypertensive diabetic female with chronic kidney disease. Subsequent encounter. EXAM: RENAL / URINARY TRACT ULTRASOUND COMPLETE COMPARISON:  11/17/2016 ultrasound. FINDINGS: Right Kidney: Length: 7.8 cm. No hydronephrosis. Increase echogenicity. 8 mm right upper pole cyst. Left Kidney: Length: 8.7 cm. No hydronephrosis. Increased echogenicity. Lower pole cyst measuring up to 4.5 cm. Bowel gas slightly limits evaluation. Bladder: Appears normal for degree of bladder distention. IMPRESSION: Progressive increased echogenicity of renal parenchyma consistent with medical renal disease changes. No hydronephrosis. 8 mm right upper pole  renal cyst and left lower pole 4.5 cm cyst. Electronically Signed   By: Genia Del M.D.   On: 05/12/2017 15:55   US Breast Ltd Uni Left Inc Axilla  Result Date: 06/05/2017 CLINICAL DATA:  Recall from screening mammography with tomosynthesis, possible mass or focal asymmetry in the upper inner subareolar left breast. EXAM: 2D DIGITAL DIAGNOSTIC LEFT MAMMOGRAM WITH ADJUNCT TOMO ULTRASOUND LEFT BREAST COMPARISON:  Mammography 06/01/2017, 05/29/2016 and earlier. Left breast ultrasound 03/24/2011 was performed in a different part of the breast. ACR Breast Density Category d: The breast tissue is extremely dense, which lowers the sensitivity of mammography. FINDINGS: Standard and tomosynthesis spot compression CC and MLO views of the area of concern in the left breast were obtained. The spot compression MLO views demonstrates a circumscribed oval medium  density mass measuring approximately 6 mm without associated architectural distortion or suspicious calcification. This is not visualized on the spot compression CC view. On physical exam, there is no palpable abnormality in the upper inner periareolar left breast. Targeted left breast ultrasound is performed, showing a possible intraductal mass at the 10 o'clock position approximately 1 cm from the nipple measuring approximately 3 x 7 x 7 mm. There is no internal power Doppler flow. Sonographic evaluation the left axilla demonstrates no pathologic lymphadenopathy. IMPRESSION: 1. Approximate 7 mm possible intraductal mass in the upper inner subareolar left breast which accounts for the area of concern on screening mammography. 2. No pathologic left axillary lymphadenopathy. RECOMMENDATION: Ultrasound-guided core needle biopsy of the possible intraductal mass in the left breast in order to exclude a high risk papilloma or DCIS. The ultrasound biopsy procedure was discussed with the patient and her questions were answered. She has agreed to proceed and the biopsy has been scheduled for Friday, August 10 at 3:45 p.m. I have discussed the findings and recommendations with the patient. Results were also provided in writing at the conclusion of the visit. BI-RADS CATEGORY  4: Suspicious. Electronically Signed   By: Evangeline Dakin M.D.   On: 06/05/2017 16:37   Mm Diag Breast Tomo Uni Left  Result Date: 06/05/2017 CLINICAL DATA:  Recall from screening mammography with tomosynthesis, possible mass or focal asymmetry in the upper inner subareolar left breast. EXAM: 2D DIGITAL DIAGNOSTIC LEFT MAMMOGRAM WITH ADJUNCT TOMO ULTRASOUND LEFT BREAST COMPARISON:  Mammography 06/01/2017, 05/29/2016 and earlier. Left breast ultrasound 03/24/2011 was performed in a different part of the breast. ACR Breast Density Category d: The breast tissue is extremely dense, which lowers the sensitivity of mammography. FINDINGS: Standard and  tomosynthesis spot compression CC and MLO views of the area of concern in the left breast were obtained. The spot compression MLO views demonstrates a circumscribed oval medium density mass measuring approximately 6 mm without associated architectural distortion or suspicious calcification. This is not visualized on the spot compression CC view. On physical exam, there is no palpable abnormality in the upper inner periareolar left breast. Targeted left breast ultrasound is performed, showing a possible intraductal mass at the 10 o'clock position approximately 1 cm from the nipple measuring approximately 3 x 7 x 7 mm. There is no internal power Doppler flow. Sonographic evaluation the left axilla demonstrates no pathologic lymphadenopathy. IMPRESSION: 1. Approximate 7 mm possible intraductal mass in the upper inner subareolar left breast which accounts for the area of concern on screening mammography. 2. No pathologic left axillary lymphadenopathy. RECOMMENDATION: Ultrasound-guided core needle biopsy of the possible intraductal mass in the left breast in order to exclude a high risk  papilloma or DCIS. The ultrasound biopsy procedure was discussed with the patient and her questions were answered. She has agreed to proceed and the biopsy has been scheduled for Friday, August 10 at 3:45 p.m. I have discussed the findings and recommendations with the patient. Results were also provided in writing at the conclusion of the visit. BI-RADS CATEGORY  4: Suspicious. Electronically Signed   By: Evangeline Dakin M.D.   On: 06/05/2017 16:37   Mm Screening Breast Tomo Bilateral  Result Date: 06/02/2017 CLINICAL DATA:  Screening. EXAM: 2D DIGITAL SCREENING BILATERAL MAMMOGRAM WITH CAD AND ADJUNCT TOMO COMPARISON:  Previous exam(s). ACR Breast Density Category c: The breast tissue is heterogeneously dense, which may obscure small masses. FINDINGS: In the left breast, a possible asymmetry warrants further evaluation. In the right  breast, no findings suspicious for malignancy. Images were processed with CAD. IMPRESSION: Further evaluation is suggested for possible asymmetry in the left breast. RECOMMENDATION: Diagnostic mammogram and possibly ultrasound of the left breast. (Code:[id number]-85M) The patient will be contacted regarding the findings, and additional imaging will be scheduled. BI-RADS CATEGORY  0: Incomplete. Need additional imaging evaluation and/or prior mammograms for comparison. Electronically Signed   By: Fidela Salisbury M.D.   On: 06/02/2017 11:08    Lab Data:  CBC:  Recent Labs Lab 06/05/17 2234 06/05/17 2320 06/06/17 0139 06/07/17 0419 06/08/17 0500  WBC 15.5*  --  17.6* 17.6* 15.4*  NEUTROABS  --  11.9* 12.1*  --   --   HGB 11.2*  --  10.5* 11.3* 11.0*  HCT 34.6*  --  32.7* 36.4 34.2*  MCV 101.2*  --  100.6* 101.7* 98.8  PLT 192  --  162 195 106   Basic Metabolic Panel:  Recent Labs Lab 06/05/17 2234 06/06/17 0139 06/07/17 0419 06/08/17 0500 06/09/17 1014  NA 136 139 140 139 138  K 4.6 4.4 3.7 3.8 4.0  CL 97* 105 101 102 101  CO2 27 25 23 28 26   GLUCOSE 167* 239* 189* 178* 249*  BUN 41* 37* 38* 35* 31*  CREATININE 2.10* 1.88* 2.19* 1.93* 1.69*  CALCIUM 9.0 7.8* 8.8* 8.8* 8.5*   GFR: Estimated Creatinine Clearance: 25.5 mL/min (A) (by C-G formula based on SCr of 1.69 mg/dL (H)). Liver Function Tests:  Recent Labs Lab 06/05/17 2234  AST 27  ALT 13*  ALKPHOS 116  BILITOT 0.7  PROT 6.7  ALBUMIN 3.1*   No results for input(s): LIPASE, AMYLASE in the last 168 hours. No results for input(s): AMMONIA in the last 168 hours. Coagulation Profile:  Recent Labs Lab 06/05/17 2234  INR 0.90   Cardiac Enzymes: No results for input(s): CKTOTAL, CKMB, CKMBINDEX, TROPONINI in the last 168 hours. BNP (last 3 results) No results for input(s): PROBNP in the last 8760 hours. HbA1C:  Recent Labs  06/07/17 0419  HGBA1C 7.6*   CBG:  Recent Labs Lab 06/08/17 2010  06/08/17 2353 06/09/17 0345 06/09/17 0734 06/09/17 1216  GLUCAP 260* 222* 107* 81 289*   Lipid Profile: No results for input(s): CHOL, HDL, LDLCALC, TRIG, CHOLHDL, LDLDIRECT in the last 72 hours. Thyroid Function Tests: No results for input(s): TSH, T4TOTAL, FREET4, T3FREE, THYROIDAB in the last 72 hours. Anemia Panel: No results for input(s): VITAMINB12, FOLATE, FERRITIN, TIBC, IRON, RETICCTPCT in the last 72 hours. Urine analysis:    Component Value Date/Time   COLORURINE STRAW (A) 06/05/2017 2301   APPEARANCEUR CLEAR 06/05/2017 2301   LABSPEC 1.011 06/05/2017 2301   PHURINE 8.0 06/05/2017 2301  GLUCOSEU NEGATIVE 06/05/2017 2301   HGBUR NEGATIVE 06/05/2017 2301   BILIRUBINUR NEGATIVE 06/05/2017 2301   BILIRUBINUR neg 08/16/2012 1531   KETONESUR NEGATIVE 06/05/2017 2301   PROTEINUR 30 (A) 06/05/2017 2301   UROBILINOGEN 0.2 06/25/2013 1002   NITRITE NEGATIVE 06/05/2017 2301   LEUKOCYTESUR NEGATIVE 06/05/2017 2301     Verneita Griffes, MD [facility] (P) [phone]

## 2017-06-09 NOTE — Care Management Important Message (Signed)
Important Message  Patient Details  Name: [name] [name] MRN: [id number] Date of Birth: Jan 01, 1941   Medicare Important Message Given:  Yes    [name] 06/09/2017, 11:51 AM

## 2017-06-09 NOTE — Progress Notes (Signed)
Physical Therapy Evaluation Note  Clinical Impression  Pt presented awake and alert in bed. Tremor noted in Bil LEs at rest. Pt communication was difficult due to impaired pt hearing, but pt husband was present to help with communication. Pt reported pain of 5 at rest in Bil LEs and increased to 8 with activity, limiting ability to bear weight. Pt Mod A for all bed mobility, required hand over hand assist to reach for rail, and use of bed pad to roll. Pt required mod assist to bring hips forward to EOB.   Pt reported dizziness upon sitting up and BP was obtained and was low at 85/18. Pt attempted to stand unprompted multiple times while obtaining BP and required verbal and tactile cues to remain seated. Pt was returned to supine and LE bed exercises were performed and then BP was 95/35.  Pt returned to sitting EOB and BP was 92/42; this improved to with 3 minutes sitting to 100/39 and standing was attempted. Pt Mod A/Max A for sit to/from stand,needing cueing for hand placement to stand using rolling walker but had difficulty following verbal and tactile cues. Pt could not grab walker at correct hand grips and grabbed bars below. Upon standing patients trunk was flexed and legs were shaking. Pt sat down unprompted due to decreased tolerance of upright standing. Pt tolerated 15 seconds of sitting and then had syncopal episode (lasting 5 seconds or less) likely caused by orthostatic hypotension and we placed patient supine (BP: 74/37) and placed bed in trendelenburg; BP increased to 112/46 after 3 minutes. RN present and paged MD regarding vitals.   Pt will benefit from skilled acute PT services to increase strength, functional mobility, and safety to facilitate discharge to next venue of care.  Our recommendation is SNF but pt states will not attend SNF. Husband states that they had a bad experience and will not go back. Husband states that he is open to trying a different SNF but that pt will not be open  to it. Neurologic consult is also recommended for pt's increasing tremors now present in all extremities which she and her husband state have been worsening recently. We encouraged pt follow up with her neurologist as an outpatient soon as well (husband tells [place] her next app is in October).   06/09/17 1100  PT Visit Information  Last PT Received On 06/09/17  Assistance Needed +2  History of Present Illness Morgin Halls is a 76 y.o. female with medical history significant for chronic kidney disease stage III, insulin-dependent diabetes mellitus, hypertension, GERD, and history of stroke with motor and cognitive deficits, now presenting to the emergency department with chills and confusion. She reportedly been in her usual state until earlier this evening when, after complaining of chills, she was noted to be confused, having difficulty with basic questioning. Admitted to the medical-surgical unit for ongoing evaluation and management of SIRS with acute encephalopathy and AKI.   Precautions  Precautions Fall  Restrictions  Weight Bearing Restrictions No  Home Living  Family/patient expects to be discharged to: Private residence  Living Arrangements Spouse/significant other;Children  Type of [place] to enter  Entrance Stairs-Number of Steps 1  [place] One level  Bathroom Shower/Tub Tub/shower unit  Tax adviser - 2 wheels;Cane - single point  Additional Comments Pt poor historian and answering home set up and PLOF with conflicting information  Prior Function  Level of [place] Needs  assistance  Gait / Transfers Assistance Needed Pt reports that she has RW but does not use it for functional mobility  ADL's / Homemaking Assistance Needed Pt reports being helped in and out of tub by husband. Pt sits down fully at base of tub floor and is lifted up by husband  Communication  Communication HOH  Pain  Assessment  Pain Assessment 0-10  Pain Score 8  Pain Location Bil LE  Pain Descriptors / Indicators Grimacing;Discomfort (Pt started at pain rating 5 and progressed to 8 at end)  Pain Intervention(s) Limited activity within patient's tolerance;Monitored during session;Patient requesting pain meds-RN notified;Repositioned  Cognition  Arousal/Alertness Awake/alert  Behavior During Therapy WFL for tasks assessed/performed  Overall Cognitive Status History of cognitive impairments - at baseline  Area of Impairment Attention;Following commands;Awareness;Problem solving  Current Attention Level Sustained  Following Commands Follows one step commands inconsistently  Awareness Emergent  Problem Solving Slow processing;Requires tactile cues;Requires verbal cues  General Comments Pt was eager to stand from sitting and atempted to stand without prompting. Pt was very hard of hearing and had trouble processing comands.   Upper Extremity Assessment  Upper Extremity Assessment Defer to OT evaluation  Lower Extremity Assessment  Lower Extremity Assessment RLE deficits/detail;LLE deficits/detail;Generalized weakness  RLE Deficits / Details Tremor present at rest, and increased pain. Tremor noted upon standing from sitting.  RLE Unable to fully assess due to pain  LLE Deficits / Details Tremor present at rest and upon standing  Bed Mobility  Overal bed mobility Needs Assistance  Bed Mobility Rolling;Sidelying to Sit;Supine to Sit;Sit to Supine  Rolling Mod assist  Sidelying to sit Mod assist  Supine to sit Mod assist  Sit to supine Mod assist  General bed mobility comments Pt required cues to reach for hand rail while rolling, but could not reach handrail and required holding a hand to help, and use of bed pad to roll. Pt required mod assist to bring hips to EOB. After sitting from standing, pt lost consciousness and went sidelying and was helped to supine position. Pt experienced orthostatic  hypotension upon sitting, that improved with 3 minutes sitting and standing was attempted.  Transfers  Overall transfer level Needs assistance  Equipment used Rolling walker (2 wheeled)  Transfers Sit to/from Stand  Sit to Stand Mod assist;Max assist;+2 safety/equipment  General transfer comment Pt needing cueing for hand placemen to stand using rolling walker. Pt had difficulty following verbal and tactile cues. Pt could not grab walker at correct hand grips and grabbed bars below. Pt sat down unprompted as she stated she could not stand any longer. Upon standing patients trunk was flexed and legs were shaking. Standing limited by fatigue and pain. Pt had episode of syncope 15 seconds  after sitting back down   Balance  Overall balance assessment Needs assistance  Sitting-balance support Feet supported  Sitting balance-Leahy Scale Fair  Sitting balance - Comments Pt demonstrated posteror lean and weakness. Pt experienced complete loss of balance at end of session due to syncope.   Postural control Posterior lean  Standing balance support Bilateral upper extremity supported  Standing balance-Leahy Scale Poor  Standing balance comment Pt limited by pain and fatigue and sat unprompted.   General Comments  General comments (skin integrity, edema, etc.) Husband states that has sores on back.  Exercises  Exercises General Lower Extremity  General Exercises - Lower Extremity  Ankle Circles/Pumps AROM;AAROM;5 reps;Strengthening;Both;Supine (Poor initiation on right side, decreased strenght on left)  Quad Sets Both;AROM;Strengthening;5  reps;Supine (Poor initiation on right side)  Hip ABduction/ADduction 5 reps;Both;Strengthening;AROM  PT - End of Session  Equipment Utilized During Treatment Gait belt  Activity Tolerance Patient limited by fatigue;Patient limited by pain;Treatment limited secondary to medical complications (Comment)  Patient left in bed;with call bell/phone within reach;with  family/visitor present  Nurse Communication Patient requests pain meds;Mobility status  PT Assessment  PT Recommendation/Assessment Patient needs continued PT services  PT Visit Diagnosis Unsteadiness on feet (R26.81);Muscle weakness (generalized) (M62.81);Other abnormalities of gait and mobility (R26.89);Dizziness and giddiness (R42)  PT Problem List Decreased strength;Decreased activity tolerance;Decreased balance;Decreased mobility;Decreased coordination;Decreased cognition;Decreased safety awareness  Barriers to Discharge Inaccessible home environment;Decreased caregiver support  Barriers to Discharge Comments Pt states will not attend SNF. Husband states that they had a bad experience and will not go back. Husband states that he is open to trying a different SNF but that pt will not be open to it.   PT Plan  PT Frequency (ACUTE ONLY) Min 3X/week  PT Treatment/Interventions (ACUTE ONLY) Functional mobility training;Therapeutic exercise;Balance training;Patient/family education;DME instruction;Gait training  AM-PAC PT "6 Clicks" Daily Activity Outcome Measure  Difficulty turning over in bed (including adjusting bedclothes, sheets and blankets)? 1  Difficulty moving from lying on back to sitting on the side of the bed?  1  Difficulty sitting down on and standing up from a chair with arms (e.g., wheelchair, bedside commode, etc,.)? 1  Help needed moving to and from a bed to chair (including a wheelchair)? 2  Help needed walking in hospital room? 1  Help needed climbing 3-5 steps with a railing?  1  6 Click Score 7  Mobility G Code  CM  PT Recommendation  Recommendations for Other Services Other (comment) (Follow up appointment with neurologist for increased tremors.)  Follow Up Recommendations SNF  PT equipment Other (comment);Wheelchair (measurements PT);Wheelchair cushion (measurements PT) Media planner, Drop arm toilet seat)  Individuals Consulted  Consulted and Agree with Results and  Recommendations Patient;Family member/caregiver  Acute Rehab PT Goals  Patient Stated Goal Return Home  PT Goal Formulation With patient/family  Time For Goal Achievement 06/16/17  Potential to Achieve Goals Poor  PT Time Calculation  PT Start Time (ACUTE ONLY) 1040  PT Stop Time (ACUTE ONLY) 1135  PT Time Calculation (min) (ACUTE ONLY) 55 min  PT General Charges  $$ ACUTE PT VISIT 1 Visit  PT Evaluation  $PT Eval Moderate Complexity 1 Mod  PT Treatments  $Therapeutic Activity 38-52 mins  Written Expression  Dominant Hand Right   Drue Stager SPT Acute Rehabilitation Services Office: [phone]

## 2017-06-10 DIAGNOSIS — I10 Essential (primary) hypertension: Secondary | ICD-10-CM

## 2017-06-10 DIAGNOSIS — R55 Syncope and collapse: Secondary | ICD-10-CM

## 2017-06-10 DIAGNOSIS — N183 Chronic kidney disease, stage 3 (moderate): Secondary | ICD-10-CM

## 2017-06-10 DIAGNOSIS — N179 Acute kidney failure, unspecified: Secondary | ICD-10-CM

## 2017-06-10 DIAGNOSIS — G9340 Encephalopathy, unspecified: Secondary | ICD-10-CM

## 2017-06-10 DIAGNOSIS — A4151 Sepsis due to Escherichia coli [E. coli]: Principal | ICD-10-CM

## 2017-06-10 LAB — CULTURE, BLOOD (ROUTINE X 2)
Culture: NO GROWTH
SPECIAL REQUESTS: ADEQUATE

## 2017-06-10 LAB — GLUCOSE, CAPILLARY
GLUCOSE-CAPILLARY: 192 mg/dL — AB (ref 65–99)
GLUCOSE-CAPILLARY: 335 mg/dL — AB (ref 65–99)
Glucose-Capillary: 104 mg/dL — ABNORMAL HIGH (ref 65–99)
Glucose-Capillary: 149 mg/dL — ABNORMAL HIGH (ref 65–99)
Glucose-Capillary: 204 mg/dL — ABNORMAL HIGH (ref 65–99)

## 2017-06-10 MED ORDER — INSULIN ASPART 100 UNIT/ML ~~LOC~~ SOLN
0.0000 [IU] | Freq: Three times a day (TID) | SUBCUTANEOUS | Status: DC
  Administered 2017-06-11 (×2): 5 [IU] via SUBCUTANEOUS

## 2017-06-10 MED ORDER — INSULIN ASPART 100 UNIT/ML ~~LOC~~ SOLN
0.0000 [IU] | Freq: Every day | SUBCUTANEOUS | Status: DC
  Administered 2017-06-10: 4 [IU] via SUBCUTANEOUS

## 2017-06-10 NOTE — Clinical Social Work Note (Signed)
CSW received consult verbally today from physical therapy recommending SNF placement. Chart reviewed and OT also recommending SNF. Chart also indicates that patient is refusing SNF due a bad experience at a facility before and husband is not willing to make patient go to a SNF.   CSW talked with [name] at the bedside regarding SNF recommendation. Patient was asleep in bed. Husband reported that he has been primary caregiver for his wife for 5 years and handles everything at home. Per [name], they have a daughter and she helps out some, but the bulk of his wife's care-giving is provided by him. Husband added that sometimes his wife will wash the dishes as she wants to feel that she is helping out with the chores at home. [name] did advise CSW of the facility patient went to and reported that they had a bad experience there, but would not go into any detail. Husband agreeable to wife going home at discharge with home health services. He indicated that they last used [facility].   CSW signing off as patient is refusing SNF and husband in agreement with her wishes. Please reconsult if SNF decision changes or if any other SW interventions needed prior to discharge.  [name] [name], MSW, LCSW Licensed Clinical Social Worker [place] [phone]

## 2017-06-10 NOTE — Progress Notes (Addendum)
PROGRESS NOTE   [name] [name]  EGB:[id number]    DOB: 1940-12-14    DOA: 06/05/2017  PCP: [name], MD   I have briefly reviewed patients previous medical records in [facility].  Brief Narrative:  76 year old married female, lives at home with spouse, PMH of stage III chronic kidney disease, GERD, HLD, HTN, seizures, IDDM, stroke with motor and cognitive deficits, presented to ED on 06/06/17 with chills and confusion.   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 Sepsis due to Escherichia coli bacteremia: One of 2 blood cultures from 8/3 confirmed pansensitive Escherichia coli. Second blood culture was negative, final report.? Source. Urine microscopy on admission was not impressive for UTI. Urine culture showed <10,000 colonies/insignificant growth. Antibiotics: IV Zosyn 3 doses, IV ceftriaxone 8/4-8/5, Keflex and 8/6 >. Sepsis resolved. Plan to complete total 10-14 days treatment. 2. Orthostatic hypotension/syncope: As per report from spouse, patient has infrequent episodes of syncope at home especially when sitting on the toilet, last episode 2 weeks ago. Medications adjusted, Cardizem and metoprolol dose reduced. Add lower extremity TED hoses when up and about. PT and OT recommend SNF which patient and spouse declined. Clinically euvolemic. 3. Essential hypertension: Has transient hypertension when standing up before orthostatic changes. Controlled. Management as above. 4. Anemia: Stable. 5. Acute on stage III chronic kidney disease/mild right hydronephrosis: Baseline creatinine 1.6-1.7. Renal ultrasound 06/06/17  shows mild right hydronephrosis. Creatinine peaked at 2.19 and has gradually improved to 1.69. Follow BMP in a.m. Consider outpatient urology consultation. 6. Type II DM/IDDM with renal complications: Mildly uncontrolled and fluctuating as below. Continue current dose of Lantus and SSI. A1c 7.3. 7. Chronic diastolic CHF: Clinically compensated. 8. Decubitus ulcers: Change position every 2 hours. At high risk for worsening or new ulcers. 9. Acute encephalopathy: As per spouse, has confusion at home but worse here than at home. Likely multifactorial due to infectious etiology and metabolic abnormalities. 10. Tremors:? Parkinson's. Spouse reports that patient sees outpatient neurology.   DVT prophylaxis: Subcutaneous heparin Code Status: Full Family Communication: Discussed in detail with patient's spouse at bedside. Disposition: DC home when medically improved, possibly in the next 1-2 days. Patient and spouse decline SNF. Even if patient lacks capacity to make medical decisions, spouse declines SNF.   Consultants:  None   Procedures:  None  Antimicrobials:  As above    Subjective: Seen this morning. Confused. "I feel bad" "there is shooting outside". No pain reported. As per spouse at bedside, more confused here than at home.   ROS: As per RN, while attempting to do orthostatics this afternoon, passed out for about 5 seconds.  Objective:  Vitals:   06/10/17 0500 06/10/17 0958 06/10/17 1300 06/10/17 1732  BP: (!) 133/51 (!) 135/54  (!) 144/59  Pulse: (!) 104 99  (!) 110  Resp: 18 18  18   Temp: 99 F (37.2 C) 98.6 F (37 C)  98.5 F (36.9 C)  TempSrc: Oral Oral  Oral  SpO2: 96% 97% 93% 96%  Weight:      Height:        Examination:  General  exam: Pleasant elderly female, lying comfortably propped up in bed. Respiratory system: Clear to auscultation. Respiratory effort normal. Cardiovascular system: S1 & S2 heard, RRR. No JVD, murmurs, rubs, gallops or clicks. No pedal  edema. Gastrointestinal system: Abdomen is nondistended, soft and nontender. No organomegaly or masses felt. Normal bowel sounds heard. Central nervous system: Alert and oriented only to self. No focal neurological deficits. Extremities: Symmetric 5 x 5 power. Resting tremors,? Pill-rolling movement of hands. Skin: No rashes, lesions or ulcers Psychiatry: Judgement and insight impaired. Mood & affect flat.     Data Reviewed: I have personally reviewed following labs and imaging studies  CBC:  Recent Labs Lab 06/05/17 2234 06/05/17 2320 06/06/17 0139 06/07/17 0419 06/08/17 0500  WBC 15.5*  --  17.6* 17.6* 15.4*  NEUTROABS  --  11.9* 12.1*  --   --   HGB 11.2*  --  10.5* 11.3* 11.0*  HCT 34.6*  --  32.7* 36.4 34.2*  MCV 101.2*  --  100.6* 101.7* 98.8  PLT 192  --  162 195 194   Basic Metabolic Panel:  Recent Labs Lab 06/05/17 2234 06/06/17 0139 06/07/17 0419 06/08/17 0500 06/09/17 1014  NA 136 139 140 139 138  K 4.6 4.4 3.7 3.8 4.0  CL 97* 105 101 102 101  CO2 27 25 23 28 26   GLUCOSE 167* 239* 189* 178* 249*  BUN 41* 37* 38* 35* 31*  CREATININE 2.10* 1.88* 2.19* 1.93* 1.69*  CALCIUM 9.0 7.8* 8.8* 8.8* 8.5*   Liver Function Tests:  Recent Labs Lab 06/05/17 2234  AST 27  ALT 13*  ALKPHOS 116  BILITOT 0.7  PROT 6.7  ALBUMIN 3.1*   Coagulation Profile:  Recent Labs Lab 06/05/17 2234  INR 0.90   Cardiac Enzymes: No results for input(s): CKTOTAL, CKMB, CKMBINDEX, TROPONINI in the last 168 hours. HbA1C: No results for input(s): HGBA1C in the last 72 hours. CBG:  Recent Labs Lab 06/09/17 2353 06/10/17 0449 06/10/17 0806 06/10/17 1210 06/10/17 1606  GLUCAP 153* 104* 149* 204* 192*    Recent Results (from the past 240 hour(s))  Culture, blood (Routine x 2)     Status: Abnormal   Collection Time: 06/05/17 10:35 PM  Result Value Ref Range Status   Specimen Description BLOOD RIGHT ANTECUBITAL  Final   Special Requests   Final    BOTTLES DRAWN  AEROBIC AND ANAEROBIC Blood Culture adequate volume   Culture  Setup Time   Final    GRAM NEGATIVE RODS IN BOTH AEROBIC AND ANAEROBIC BOTTLES CRITICAL RESULT CALLED TO, READ BACK BY AND VERIFIED WITH: R RUMBARGER,PHARMD AT 1740 06/06/17 BY L BENFIELD    Culture ESCHERICHIA COLI (A)  Final   Report Status 06/08/2017 FINAL  Final   Organism ID, Bacteria ESCHERICHIA COLI  Final      Susceptibility   Escherichia coli - MIC*    AMPICILLIN 4 SENSITIVE Sensitive     CEFAZOLIN <=4 SENSITIVE Sensitive     CEFEPIME <=1 SENSITIVE Sensitive     CEFTAZIDIME <=1 SENSITIVE Sensitive     CEFTRIAXONE <=1 SENSITIVE Sensitive     CIPROFLOXACIN <=0.25 SENSITIVE Sensitive     GENTAMICIN <=1 SENSITIVE Sensitive     IMIPENEM <=0.25 SENSITIVE Sensitive     TRIMETH/SULFA <=20 SENSITIVE Sensitive     AMPICILLIN/SULBACTAM <=2 SENSITIVE Sensitive     PIP/TAZO <=4 SENSITIVE Sensitive     Extended ESBL NEGATIVE Sensitive     * ESCHERICHIA COLI  Blood Culture ID Panel (Reflexed)  Status: Abnormal   Collection Time: 06/05/17 10:35 PM  Result Value Ref Range Status   Enterococcus species NOT DETECTED NOT DETECTED Final   Listeria monocytogenes NOT DETECTED NOT DETECTED Final   Staphylococcus species NOT DETECTED NOT DETECTED Final   Staphylococcus aureus NOT DETECTED NOT DETECTED Final   Streptococcus species NOT DETECTED NOT DETECTED Final   Streptococcus agalactiae NOT DETECTED NOT DETECTED Final   Streptococcus pneumoniae NOT DETECTED NOT DETECTED Final   Streptococcus pyogenes NOT DETECTED NOT DETECTED Final   Acinetobacter baumannii NOT DETECTED NOT DETECTED Final   Enterobacteriaceae species DETECTED (A) NOT DETECTED Final    Comment: Enterobacteriaceae represent a large family of gram-negative bacteria, not a single organism. CRITICAL RESULT CALLED TO, READ BACK BY AND VERIFIED WITH: R RUMBARGER,PHARMD AT 1149 06/06/17 BY L BENFIELD    Enterobacter cloacae complex NOT DETECTED NOT DETECTED Final    Escherichia coli DETECTED (A) NOT DETECTED Final    Comment: CRITICAL RESULT CALLED TO, READ BACK BY AND VERIFIED WITH: R RUMBARGER,PHARMD AT 1149 06/06/17 BY L BENFIELD    Klebsiella oxytoca NOT DETECTED NOT DETECTED Final   Klebsiella pneumoniae NOT DETECTED NOT DETECTED Final   Proteus species NOT DETECTED NOT DETECTED Final   Serratia marcescens NOT DETECTED NOT DETECTED Final   Carbapenem resistance NOT DETECTED NOT DETECTED Final   Haemophilus influenzae NOT DETECTED NOT DETECTED Final   Neisseria meningitidis NOT DETECTED NOT DETECTED Final   Pseudomonas aeruginosa NOT DETECTED NOT DETECTED Final   Candida albicans NOT DETECTED NOT DETECTED Final   Candida glabrata NOT DETECTED NOT DETECTED Final   Candida krusei NOT DETECTED NOT DETECTED Final   Candida parapsilosis NOT DETECTED NOT DETECTED Final   Candida tropicalis NOT DETECTED NOT DETECTED Final  Culture, blood (Routine x 2)     Status: None   Collection Time: 06/05/17 10:47 PM  Result Value Ref Range Status   Specimen Description BLOOD LEFT FOREARM  Final   Special Requests IN PEDIATRIC BOTTLE Blood Culture adequate volume  Final   Culture NO GROWTH 5 DAYS  Final   Report Status 06/10/2017 FINAL  Final  Urine culture     Status: Abnormal   Collection Time: 06/05/17 11:03 PM  Result Value Ref Range Status   Specimen Description URINE, CATHETERIZED  Final   Special Requests NONE  Final   Culture <10,000 COLONIES/mL INSIGNIFICANT GROWTH (A)  Final   Report Status 06/07/2017 FINAL  Final         Radiology Studies: No results found.      Scheduled Meds: . aspirin EC  81 mg Oral Daily  . atorvastatin  10 mg Oral q1800  . cephALEXin  500 mg Oral Q12H  . diltiazem  60 mg Oral BID  . divalproex  750 mg Oral Daily  . heparin  5,000 Units Subcutaneous Q8H  . insulin aspart  0-15 Units Subcutaneous Q4H  . insulin glargine  5 Units Subcutaneous QHS  . magnesium oxide  400 mg Oral BID  . metoprolol succinate  25 mg  Oral Daily  . multivitamin with minerals  1 tablet Oral Daily  . pantoprazole  40 mg Oral Daily  . polyethylene glycol  17 g Oral Daily   Continuous Infusions: . sodium chloride 75 mL/hr at 06/10/17 1118  . sodium chloride       LOS: 5 days     HONGALGI,ANAND, MD, FACP, FHM. [facility] Pager [phone]  If 7PM-7AM, please contact night-coverage [email or web address] Password [facility] 06/10/2017, 6:19  PM

## 2017-06-10 NOTE — Progress Notes (Signed)
Patient had a syncopal episode while performing orthostatic bloodpressures. Patient slumped over with head down for 5 seconds. After patient was laid back down in bed the patient became more aware. MD aware.  Sheliah Plane RN

## 2017-06-11 ENCOUNTER — Emergency Department (HOSPITAL_COMMUNITY): Payer: Medicare Other

## 2017-06-11 ENCOUNTER — Inpatient Hospital Stay (HOSPITAL_COMMUNITY)
Admission: EM | Admit: 2017-06-11 | Discharge: 2017-06-15 | DRG: 871 | Disposition: A | Discharge status: Skilled Nursing Facility | Payer: Medicare Other | Attending: Internal Medicine | Admitting: Internal Medicine

## 2017-06-11 ENCOUNTER — Encounter (HOSPITAL_COMMUNITY): Payer: Self-pay | Admitting: Nurse Practitioner

## 2017-06-11 DIAGNOSIS — Z91040 Latex allergy status: Secondary | ICD-10-CM

## 2017-06-11 DIAGNOSIS — Z515 Encounter for palliative care: Secondary | ICD-10-CM

## 2017-06-11 DIAGNOSIS — E118 Type 2 diabetes mellitus with unspecified complications: Secondary | ICD-10-CM

## 2017-06-11 DIAGNOSIS — N183 Chronic kidney disease, stage 3 unspecified: Secondary | ICD-10-CM | POA: Diagnosis present

## 2017-06-11 DIAGNOSIS — E785 Hyperlipidemia, unspecified: Secondary | ICD-10-CM | POA: Diagnosis present

## 2017-06-11 DIAGNOSIS — Z79899 Other long term (current) drug therapy: Secondary | ICD-10-CM

## 2017-06-11 DIAGNOSIS — B9620 Unspecified Escherichia coli [E. coli] as the cause of diseases classified elsewhere: Secondary | ICD-10-CM | POA: Diagnosis present

## 2017-06-11 DIAGNOSIS — D649 Anemia, unspecified: Secondary | ICD-10-CM | POA: Diagnosis present

## 2017-06-11 DIAGNOSIS — E876 Hypokalemia: Secondary | ICD-10-CM | POA: Diagnosis present

## 2017-06-11 DIAGNOSIS — Z888 Allergy status to other drugs, medicaments and biological substances status: Secondary | ICD-10-CM

## 2017-06-11 DIAGNOSIS — A4151 Sepsis due to Escherichia coli [E. coli]: Secondary | ICD-10-CM | POA: Diagnosis not present

## 2017-06-11 DIAGNOSIS — R55 Syncope and collapse: Secondary | ICD-10-CM

## 2017-06-11 DIAGNOSIS — J189 Pneumonia, unspecified organism: Secondary | ICD-10-CM | POA: Diagnosis present

## 2017-06-11 DIAGNOSIS — Z794 Long term (current) use of insulin: Secondary | ICD-10-CM

## 2017-06-11 DIAGNOSIS — Z833 Family history of diabetes mellitus: Secondary | ICD-10-CM

## 2017-06-11 DIAGNOSIS — E1122 Type 2 diabetes mellitus with diabetic chronic kidney disease: Secondary | ICD-10-CM | POA: Diagnosis present

## 2017-06-11 DIAGNOSIS — Z7982 Long term (current) use of aspirin: Secondary | ICD-10-CM

## 2017-06-11 DIAGNOSIS — A419 Sepsis, unspecified organism: Secondary | ICD-10-CM

## 2017-06-11 DIAGNOSIS — L8990 Pressure ulcer of unspecified site, unspecified stage: Secondary | ICD-10-CM | POA: Insufficient documentation

## 2017-06-11 DIAGNOSIS — I129 Hypertensive chronic kidney disease with stage 1 through stage 4 chronic kidney disease, or unspecified chronic kidney disease: Secondary | ICD-10-CM | POA: Diagnosis present

## 2017-06-11 DIAGNOSIS — G40209 Localization-related (focal) (partial) symptomatic epilepsy and epileptic syndromes with complex partial seizures, not intractable, without status epilepticus: Secondary | ICD-10-CM | POA: Diagnosis present

## 2017-06-11 DIAGNOSIS — K219 Gastro-esophageal reflux disease without esophagitis: Secondary | ICD-10-CM | POA: Diagnosis present

## 2017-06-11 DIAGNOSIS — I951 Orthostatic hypotension: Secondary | ICD-10-CM

## 2017-06-11 DIAGNOSIS — I6930 Unspecified sequelae of cerebral infarction: Secondary | ICD-10-CM

## 2017-06-11 DIAGNOSIS — R7881 Bacteremia: Secondary | ICD-10-CM | POA: Diagnosis present

## 2017-06-11 DIAGNOSIS — I10 Essential (primary) hypertension: Secondary | ICD-10-CM | POA: Diagnosis present

## 2017-06-11 DIAGNOSIS — E43 Unspecified severe protein-calorie malnutrition: Secondary | ICD-10-CM | POA: Diagnosis present

## 2017-06-11 DIAGNOSIS — I69319 Unspecified symptoms and signs involving cognitive functions following cerebral infarction: Secondary | ICD-10-CM

## 2017-06-11 DIAGNOSIS — G9341 Metabolic encephalopathy: Secondary | ICD-10-CM

## 2017-06-11 DIAGNOSIS — Z7189 Other specified counseling: Secondary | ICD-10-CM

## 2017-06-11 DIAGNOSIS — R531 Weakness: Secondary | ICD-10-CM

## 2017-06-11 DIAGNOSIS — E870 Hyperosmolality and hypernatremia: Secondary | ICD-10-CM | POA: Diagnosis not present

## 2017-06-11 LAB — URINALYSIS, ROUTINE W REFLEX MICROSCOPIC
BILIRUBIN URINE: NEGATIVE
KETONES UR: 5 mg/dL — AB
Nitrite: NEGATIVE
PH: 6 (ref 5.0–8.0)
Protein, ur: 30 mg/dL — AB
SQUAMOUS EPITHELIAL / LPF: NONE SEEN
Specific Gravity, Urine: 1.011 (ref 1.005–1.030)

## 2017-06-11 LAB — CBC WITH DIFFERENTIAL/PLATELET
BASOS ABS: 0 10*3/uL (ref 0.0–0.1)
BASOS PCT: 0 %
EOS ABS: 0.1 10*3/uL (ref 0.0–0.7)
EOS PCT: 1 %
HCT: 29.3 % — ABNORMAL LOW (ref 36.0–46.0)
Hemoglobin: 9.8 g/dL — ABNORMAL LOW (ref 12.0–15.0)
Lymphocytes Relative: 10 %
Lymphs Abs: 1.9 10*3/uL (ref 0.7–4.0)
MCH: 33.2 pg (ref 26.0–34.0)
MCHC: 33.4 g/dL (ref 30.0–36.0)
MCV: 99.3 fL (ref 78.0–100.0)
MONOS PCT: 15 %
Monocytes Absolute: 2.9 10*3/uL — ABNORMAL HIGH (ref 0.1–1.0)
Neutro Abs: 13.9 10*3/uL — ABNORMAL HIGH (ref 1.7–7.7)
Neutrophils Relative %: 74 %
PLATELETS: 312 10*3/uL (ref 150–400)
RBC: 2.95 MIL/uL — AB (ref 3.87–5.11)
RDW: 13.7 % (ref 11.5–15.5)
WBC: 18.8 10*3/uL — ABNORMAL HIGH (ref 4.0–10.5)

## 2017-06-11 LAB — BASIC METABOLIC PANEL WITH GFR
Anion gap: 12 (ref 5–15)
BUN: 30 mg/dL — ABNORMAL HIGH (ref 6–20)
CO2: 30 mmol/L (ref 22–32)
Calcium: 8.8 mg/dL — ABNORMAL LOW (ref 8.9–10.3)
Chloride: 98 mmol/L — ABNORMAL LOW (ref 101–111)
Creatinine, Ser: 1.64 mg/dL — ABNORMAL HIGH (ref 0.44–1.00)
GFR calc Af Amer: 34 mL/min — ABNORMAL LOW
GFR calc non Af Amer: 29 mL/min — ABNORMAL LOW
Glucose, Bld: 299 mg/dL — ABNORMAL HIGH (ref 65–99)
Potassium: 3.9 mmol/L (ref 3.5–5.1)
Sodium: 140 mmol/L (ref 135–145)

## 2017-06-11 LAB — COMPREHENSIVE METABOLIC PANEL
ALBUMIN: 2.2 g/dL — AB (ref 3.5–5.0)
ALT: 19 U/L (ref 14–54)
AST: 27 U/L (ref 15–41)
Alkaline Phosphatase: 98 U/L (ref 38–126)
Anion gap: 9 (ref 5–15)
BUN: 38 mg/dL — AB (ref 6–20)
CHLORIDE: 101 mmol/L (ref 101–111)
CO2: 30 mmol/L (ref 22–32)
CREATININE: 1.84 mg/dL — AB (ref 0.44–1.00)
Calcium: 8.6 mg/dL — ABNORMAL LOW (ref 8.9–10.3)
GFR calc Af Amer: 30 mL/min — ABNORMAL LOW (ref >60)
GFR, EST NON AFRICAN AMERICAN: 26 mL/min — AB (ref >60)
Glucose, Bld: 342 mg/dL — ABNORMAL HIGH (ref 65–99)
POTASSIUM: 4 mmol/L (ref 3.5–5.1)
SODIUM: 140 mmol/L (ref 135–145)
Total Bilirubin: 0.6 mg/dL (ref 0.3–1.2)
Total Protein: 6.3 g/dL — ABNORMAL LOW (ref 6.5–8.1)

## 2017-06-11 LAB — CBC
HCT: 31.3 % — ABNORMAL LOW (ref 36.0–46.0)
Hemoglobin: 9.9 g/dL — ABNORMAL LOW (ref 12.0–15.0)
MCH: 31.9 pg (ref 26.0–34.0)
MCHC: 31.6 g/dL (ref 30.0–36.0)
MCV: 101 fL — ABNORMAL HIGH (ref 78.0–100.0)
Platelets: 320 K/uL (ref 150–400)
RBC: 3.1 MIL/uL — ABNORMAL LOW (ref 3.87–5.11)
RDW: 13.9 % (ref 11.5–15.5)
WBC: 17.1 K/uL — ABNORMAL HIGH (ref 4.0–10.5)

## 2017-06-11 LAB — I-STAT TROPONIN, ED: TROPONIN I, POC: 0.01 ng/mL (ref 0.00–0.08)

## 2017-06-11 LAB — GLUCOSE, CAPILLARY
GLUCOSE-CAPILLARY: 270 mg/dL — AB (ref 65–99)
GLUCOSE-CAPILLARY: 278 mg/dL — AB (ref 65–99)

## 2017-06-11 LAB — I-STAT CG4 LACTIC ACID, ED: LACTIC ACID, VENOUS: 1.69 mmol/L (ref 0.5–1.9)

## 2017-06-11 MED ORDER — ACETAMINOPHEN 325 MG PO TABS: 650.0000 mg | ORAL_TABLET | Freq: Four times a day (QID) | ORAL | Status: DC | PRN

## 2017-06-11 MED ORDER — METOPROLOL SUCCINATE ER 25 MG PO TB24: 25.0000 mg | ORAL_TABLET | Freq: Every day | ORAL | 0 refills | Status: DC

## 2017-06-11 MED ORDER — CEPHALEXIN 500 MG PO CAPS: 500.0000 mg | ORAL_CAPSULE | Freq: Two times a day (BID) | ORAL | 0 refills | Status: DC

## 2017-06-11 MED ORDER — DILTIAZEM HCL 60 MG PO TABS: 60.0000 mg | ORAL_TABLET | Freq: Two times a day (BID) | ORAL | 0 refills | Status: DC

## 2017-06-11 MED ORDER — SODIUM CHLORIDE 0.9 % IV BOLUS (SEPSIS)
1000.0000 mL | Freq: Once | INTRAVENOUS | Status: AC
  Administered 2017-06-11: 1000 mL via INTRAVENOUS

## 2017-06-11 MED ORDER — INSULIN ASPART 100 UNIT/ML ~~LOC~~ SOLN
0.0000 [IU] | Freq: Three times a day (TID) | SUBCUTANEOUS | Status: DC
  Administered 2017-06-12: 3 [IU] via SUBCUTANEOUS
  Administered 2017-06-12: 5 [IU] via SUBCUTANEOUS
  Administered 2017-06-12: 8 [IU] via SUBCUTANEOUS
  Administered 2017-06-13: 11 [IU] via SUBCUTANEOUS
  Administered 2017-06-13 – 2017-06-14 (×2): 2 [IU] via SUBCUTANEOUS
  Administered 2017-06-14: 8 [IU] via SUBCUTANEOUS
  Administered 2017-06-15: 2 [IU] via SUBCUTANEOUS
  Administered 2017-06-15: 5 [IU] via SUBCUTANEOUS

## 2017-06-11 MED ORDER — POLYETHYLENE GLYCOL 3350 17 G PO PACK: 17.0000 g | PACK | Freq: Every day | ORAL | 0 refills | Status: DC | PRN

## 2017-06-11 MED ORDER — MAGNESIUM 500 MG PO TABS: 500.0000 mg | ORAL_TABLET | Freq: Two times a day (BID) | ORAL | Status: DC

## 2017-06-11 MED ORDER — DEXTROSE 5 % IV SOLN
2.0000 g | Freq: Once | INTRAVENOUS | Status: AC
  Administered 2017-06-11: 2 g via INTRAVENOUS
  Filled 2017-06-11: qty 2

## 2017-06-11 MED ORDER — INSULIN ASPART 100 UNIT/ML ~~LOC~~ SOLN
0.0000 [IU] | Freq: Every day | SUBCUTANEOUS | Status: DC
  Administered 2017-06-12: 3 [IU] via SUBCUTANEOUS
  Administered 2017-06-13: 2 [IU] via SUBCUTANEOUS
  Administered 2017-06-14: 3 [IU] via SUBCUTANEOUS

## 2017-06-11 NOTE — ED Notes (Signed)
Patient transported to CT 

## 2017-06-11 NOTE — H&P (Signed)
History and Physical    [name] [name] ZJI:[id number] DOB: 1941/08/04 DOA: 06/11/2017  PCP: [name], MD Consultants:  Leonie Man - neurology; Buddy Duty - endocrinology; Colodonado - nephrology Patient coming from:  Home - lives with husband and daughter; Donald Prose: husband, [phone]  Chief Complaint: weakness  HPI: [name] [name] is a 76 y.o. 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 She was discharged earlier today, about 4pm.  When they arrived home, it was difficult to get her out of the car - can't really move her legs, can't walk.  She fell backwards onto the concrete but did not hit her head.  Her husband couldn't get her up and so he called 911 and they brought her back in.  She was recommended to go to SNF at the time of discharge and she didn't want to go there so they decided to go home with home health.  He now realizes that this was a mistake.  Medically unchanged from the time of discharge.  Husband reports that she sleeps a lot at baseline.  Please see prior hospital records for additional information.  ED Course:  Given 2L IVF and 2 grams of Rocephin  Review of Systems: As per HPI; otherwise review of systems reviewed and negative. This is suspect because the patient was primarily sleeping during the evaluation and even when awake it was not entirely clear that she was following the conversation.  Ambulatory Status:  Currently non-ambulatory  Past Medical History:  Diagnosis Date  . Anemia   . Arthritis    "a touch in my hands" (11/17/2016)  . Chronic back pain   . Chronic kidney disease (CKD), active medical management without dialysis, stage 3 (moderate)   . Depression   . GERD  (gastroesophageal reflux disease)   . High cholesterol   . Hyperlipidemia   . Hypertension   . Seizures ([place])    last seizure 2014 (11/17/2016)  . Stroke [facility]) 2013   Secondary to acute right posterior temporo-occipital intra-axial hemorrhage  . Type II diabetes mellitus ([place])     Past Surgical History:  Procedure Laterality Date  . CATARACT EXTRACTION W/ INTRAOCULAR LENS IMPLANT Bilateral   . COLONOSCOPY    . DILATION AND CURETTAGE OF UTERUS  X 3  . 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 Marital status: Married    Spouse name: N/A  . Number of children: N/A  . Years of education: N/A   Occupational History  . Retired    Social History Main Topics  . Smoking status: Never Smoker  . Smokeless tobacco: Never Used  . Alcohol use No     Comment: patient drinks caffeinated drinks.   . Drug use: No  . Sexual activity: No   Other Topics Concern  . Not on file   Social History Narrative   Patient lives at home with her husband and daughter and has a high school education.     Allergies  Allergen Reactions  . Barbiturates     Becomes restless.  Also with all "strong"  medications like sleeping pills  . Latex Itching    Family History  Problem Relation Age of Onset  . Cancer Mother   . Colon cancer Mother   . Liver disease Mother   . Breast cancer Mother   . Cancer Father   . Diabetes Paternal Grandmother   . Breast cancer Maternal Aunt   . Breast cancer Maternal Aunt   . Esophageal cancer Neg Hx   . Rectal cancer Neg Hx   . Stomach cancer Neg Hx     Prior to Admission medications   Medication Sig Start Date End Date Taking? Authorizing Provider  acetaminophen (TYLENOL) 325 MG tablet Take 2 tablets (650 mg total) by mouth every 6 (six) hours as needed for mild pain, moderate pain,  fever or headache. 06/11/17  Yes Hongalgi, Lenis Dickinson, MD  aspirin 81 MG tablet Take 81 mg by mouth daily.   Yes [provider]  atorvastatin (LIPITOR) 10 MG tablet Take 10 mg by mouth daily.  02/11/13  Yes [provider]  divalproex (DEPAKOTE ER) 250 MG 24 hr tablet Take 3 tablets (750 mg total) by mouth daily. 12/23/16  Yes Garvin Fila, MD  LEVEMIR FLEXTOUCH 100 UNIT/ML Pen INJECT 8 UNITS SUBCUTANEOUSLY DAILY AT NIGHT 05/14/15  Yes [provider]  Magnesium 500 MG TABS Take 1 tablet (500 mg total) by mouth 2 (two) times daily. 06/11/17  Yes Hongalgi, Lenis Dickinson, MD  metoprolol tartrate (LOPRESSOR) 25 MG tablet Take 25 mg by mouth 2 (two) times daily. 06/01/17  Yes [provider]  Multiple Vitamin (MULTIVITAMIN) capsule Take 1 capsule by mouth daily.   Yes [provider]  NOVOLOG FLEXPEN 100 UNIT/ML SOPN FlexPen Inject 4-9 Units into the skin 3 (three) times daily with meals. Per sliding scale, 4 units if over 200 = 5 units in the morning and at lunch and 8 units if over 200 = 9 units. 07/29/13  Yes [provider]  omeprazole (PRILOSEC) 20 MG capsule Take 20 mg by mouth every morning.  03/17/15  Yes [provider]  vitamin C (ASCORBIC ACID) 500 MG tablet Take 500 mg by mouth daily.   Yes [provider]  cephALEXin (KEFLEX) 500 MG capsule Take 1 capsule (500 mg total) by mouth 2 (two) times daily. 06/11/17   Hongalgi, Lenis Dickinson, MD  diltiazem (CARDIZEM) 60 MG tablet Take 1 tablet (60 mg total) by mouth 2 (two) times daily. 06/11/17   Hongalgi, Lenis Dickinson, MD  hydrOXYzine (ATARAX/VISTARIL) 25 MG tablet TAKE 1 TABLET BY MOUTH AT BEDTIME AS NEEDED FOR ITCHING 04/25/15   [provider]  metoprolol succinate (TOPROL-XL) 25 MG 24 hr tablet Take 1 tablet (25 mg total) by mouth daily. 06/12/17   Hongalgi, Lenis Dickinson, MD  polyethylene glycol (MIRALAX / GLYCOLAX) packet Take 17 g by mouth daily as needed for moderate constipation. 06/11/17    Modena Jansky, MD    Physical Exam: Vitals:   06/11/17 1920 06/11/17 1921 06/11/17 1930 06/11/17 2200  BP: 130/80  (!) 135/49 135/71  Pulse: (!) 117  (!) 112 (!) 116  Resp: 20  20 20   Temp: 98.3 F (36.8 C)   98.2 F (36.8 C)  TempSrc: Rectal   Oral  SpO2: 96% 98% (!) 89% 94%  Weight:  68.5 kg (151 lb)    Height:  5\' 5"  (1.651 m)       General:  Appears calm and comfortable and is NAD Eyes:  PERRL, EOMI, normal lids,  iris ENT:  Hard of hearing, normal lips & tongue,  Dry mm Neck:  no LAD, masses or thyromegaly Cardiovascular: Tachycardia, no m/r/g. No LE edema.  Respiratory:   CTA bilaterally with no wheezes/rales/rhonchi.  Normal respiratory effort. Abdomen:  soft, NT, ND, NABS Skin:  no rash or induration seen on limited exam Musculoskeletal:  no bony abnormality Lower extremity: No LE edema.  Limited foot exam with no ulcerations.  2+ distal pulses. Psychiatric:BLunted mood and affect, speech sparse.   Neurologic:  CN 2-12 grossly intact, moves all extremities in coordinated fashion    Radiological Exams on Admission: Dg Chest 1 View  Result Date: 06/11/2017 CLINICAL DATA:  Syncope. EXAM: CHEST 1 VIEW COMPARISON:  06/05/2017 and prior chest radiographs FINDINGS: The cardiomediastinal silhouette is unremarkable. There is no evidence of focal airspace disease, pulmonary edema, suspicious pulmonary nodule/mass, pleural effusion, or pneumothorax. No acute bony abnormalities are identified. IMPRESSION: No active disease. Electronically Signed   By: Margarette Canada M.D.   On: 06/11/2017 20:32   Ct Head Wo Contrast  Result Date: 06/11/2017 CLINICAL DATA:  Syncope. EXAM: CT HEAD WITHOUT CONTRAST TECHNIQUE: Contiguous axial images were obtained from the base of the skull through the vertex without intravenous contrast. COMPARISON:  11/15/2016 FINDINGS: Brain: There is no evidence for acute hemorrhage, hydrocephalus, mass lesion, or abnormal extra-axial fluid collection. No definite CT  evidence for acute infarction. Diffuse loss of parenchymal volume is consistent with atrophy. Patchy low attenuation in the deep hemispheric and periventricular white matter is nonspecific, but likely reflects chronic microvascular ischemic demyelination. Vascular: No hyperdense vessel or unexpected calcification. Skull: No evidence for fracture. No worrisome lytic or sclerotic lesion. Sinuses/Orbits: The visualized paranasal sinuses and mastoid air cells are clear. Visualized portions of the globes and intraorbital fat are unremarkable. Other: As noted on prior studies, there is a soft tissue nodule measuring up to about 18 mm, incompletely visualize, in the posterior left nose, superficial to the medial aspect of the left maxillary sinus. IMPRESSION: 1. Stable.  No acute intracranial abnormality. 2. Atrophy with chronic small vessel white matter ischemic disease. Electronically Signed   By: Misty Stanley M.D.   On: 06/11/2017 20:25    EKG: Independently reviewed.  NSR with rate 118; RBBB, nonspecific ST changes with no evidence of acute ischemia  Labs on Admission: I have personally reviewed the available labs and imaging studies at the time of the admission.  Pertinent labs:  UA: rare bacteria, ?500 glucose, moderate Hgb, 5 ketones, large LE, 6-30 WBC Glucose 342 BUN 38/Creatinine 1.94/GFR 26 at 1948; 30/1.64/29 at 0412 Albumin 2.2 WBC 18.8; 17.1 this AM Hgb 9.8  Assessment/Plan Principal Problem:   Weakness Active Problems:   DM type 2 (diabetes mellitus, type 2) (HCC)   HTN (hypertension)   CKD (chronic kidney disease) stage 3, GFR 30-59 ml/min   Protein-calorie malnutrition, severe (HCC)   Bacteremia due to Escherichia coli   Partial symptomatic epilepsy with complex partial seizures, not intractable, without status epilepticus ([place])   Sepsis ([place])   Anemia    Weakness -Patient with prolonged hospitalization for sepsis, discharged to home today after recommendation for SNF  declined by patient/family -Upon arrival home, patient was unable to get into the house and collapsed to the ground -Husband was unable to get her into the house and called 911 -He is now in agreement with plan for placement -Social work consult requested -Palliative care consult requested -Will observe overnight  Sepsis due to E coli bacteremia -Patient with  1/2 positive blood cultures during prior hospitalization -Pansensitive E coli and she was prescribed Keflex -She did not fill the prescription and so has not received an additional dose of antibiotic today, which likely explains the mild increase in leukocytosis -UA on prior admission was unremarkable but currently is suspicious for UTI -She does have ongoing criteria for sepsis physiology including tachycardia and leukocytosis -She received 1 dose of Rocephin in the ER -Will continue Keflex for now -Blood and urine cultures are pending -Of note, the patient also had E coli bacteremia in 1/18 and so ID consultation was suggested at the time of discharge today  CKD -Patient with baseline creatinine 1.6-1.7 -Slightly worse now which is likely due to transport to/from home and repeat ER visit with little opportunity for hydration/PO intake  -Given 2L IVF in ER -Will continue with judicious hydration at 100 cc/hr and recheck in AM -She did have renal failure during prior hospitalization with creatinine peak at 2.19; renal US 8/4 with mild righthydronephrosis  HTN -Medication changes during hospitalization include reduction in Cardizem and transition from BID Lopressor to Toprol XL -Will follow  Anemia -Hgb stable from earlier today, but decreased during hospitalization -Will follow  DM -Recent A1c 7.3 -Continue Levemir and SSI  Malnutrition -Patient had SLP evaluation during hospitalization but does not appear to have had nutrition consult - will add  Seizures -Continue Depakote -Will check level   DVT prophylaxis:   Lovenox  Code Status: Full - confirmed with patient/family Family Communication: Husband present throughout evaluation Disposition Plan:  SNF once clinically improved Consults called: SW, palliative care, nutrition Admission status: It is my clinical opinion that referral for OBSERVATION is reasonable and necessary in this patient based on the above information provided. The aforementioned taken together are felt to place the patient at high risk for further clinical deterioration. However it is anticipated that the patient may be medically stable for discharge from the hospital within 24 to 48 hours.    Karmen Bongo MD [facility]  If note is complete, please contact covering daytime or nighttime physician. [email or web address] Password [email or web address]  06/11/2017, 11:16 PM

## 2017-06-11 NOTE — Progress Notes (Signed)
Orthostatics completed and done prior to medication administration.

## 2017-06-11 NOTE — Discharge Summary (Addendum)
Physician Discharge Summary  [name] [name] OVZ:[id number] DOB: [birth year]  PCP: [name], MD  Admit date: 06/05/2017 Discharge date: 06/11/2017  Recommendations for Outpatient Follow-up:  1. Dr. [name], PCP in 3 days with repeat labs (CBC with differential & BMP).  2. Dr. Antony Contras, Neurology in 1 week.  Home Health: PT, OT, RN, aide and Education officer, museum. Equipment/Devices: None    Discharge Condition: Improved and stable.  CODE STATUS: Full.  Diet recommendation: Heart healthy & diabetic diet.  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 As per spouse's report, ambulates at home without assistance, does some work i.e. does dishes at times, able to bathe with assistance. He does report a long history of chronic confusion at home. In the ED, febrile at 46F, not hypoxic, tachycardic in the 110s. EKG showed sinus tachycardia at 113 and chronic RBBB. Chest x-ray negative for acute cardiopulmonary disease. Lab work showed BUN 41, creatinine 2.1, up from 1.7 in January. WBC 15 K, hemoglobin 11.2. Urine microscopy unremarkable. Lactate 2.07. Treated for sepsis protocol with IV fluids and initiated IV Zosyn and vancomycin. Admitted for further evaluation and management.   Assessment & Plan:   1. Sepsis due to Escherichia coli bacteremia: One of 2 blood  cultures from 8/3 confirmed pansensitive Escherichia coli. Second blood culture was negative, final report.? Source. Urine microscopy on admission was not impressive for UTI. Urine culture showed <10,000 colonies/insignificant growth. Antibiotics: IV Zosyn 3 doses, IV ceftriaxone 8/4-8/5, Keflex since 8/6 >. Sepsis clinically resolved and has defervesced. Discussed with infectious disease M.D. on call who recommends total 7-10 days antibiotics. Complete Keflex course for additional 5 days. On chart review, it appears that patient had Escherichia coli bacteremia in January 2018 as well.? Urinary tract source. May consider outpatient ID consultation. Although she is on appropriate antibiotics based on sensitivities, may consider repeating blood cultures after completion of antibiotics to make sure her bacteremia has cleared. 2. Orthostatic hypotension/syncope: As per report from spouse, patient has infrequent episodes of syncope at home especially when sitting on the toilet, last episode 2 weeks ago and none prior to that for a couple of years. Medications adjusted, Cardizem reduced and metoprolol changed to XL. Added lower extremity TED hoses when up and about. PT and OT recommend SNF which patient and spouse declined. Clinically euvolemic. Lasix on hold.? 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 Recommend outpatient follow-up with neurology. Maximized home health services as indicated above. 3. Essential hypertension: Has transient hypertension when standing up before orthostatic changes. Controlled. Management as above. 4. Anemia: Slight drop in hemoglobin from 11 range to 9.9. No overt bleeding. Follow CBCs as outpatient. 5. Leukocytosis: Possibly related to problem #1. Continue antibiotic treatment as above and close follow-up of CBC as outpatient. 6. Acute on stage III chronic kidney disease/mild right hydronephrosis: Baseline creatinine 1.6-1.7. Renal ultrasound 06/06/17 shows mild right hydronephrosis.  Creatinine peaked  at 2.19 and has gradually improved and plateaued at 1.6 range. Follow BMP closely as outpatient. Continue to hold Lasix given recent acute kidney injury, fluctuating and poor oral intake and clinically euvolemic status. Discussed with urologist on call and no intervention indicated at this time. May consider outpatient urology consultation if has recurrent acute kidney injury or concern for UTIs. 7. Type II DM/IDDM with renal complications: Uncontrolled and fluctuating on reduced dose of Lantus and SSI while hospitalized. A1c 7.3. Return home on prior dose of Levemir and SSI. Close outpatient follow-up. 8. Chronic diastolic CHF: Clinically compensated. Lasix on temporary hold as indicated above. 9. MASD: Change position every 2 hours. At high risk for worsening or new ulcers. [facility] RN consulted and educated patient and family regarding care at home. 10. Acute encephalopathy: As per spouse, has confusion at home but worse here than at home. Likely multifactorial due to infectious etiology, metabolic abnormalities and location change. He feels that mental status is better today than yesterday. 11. Tremors:? Parkinson's. Spouse reports that patient sees outpatient neurology. Recommend close OP neurology follow-up. 12. Complex partial seizures: Follows with Dr. Leonie Man, neurology as outpatient. She has had prior right parieto-occipital hemorrhagic stroke in 2013. 13. GERD: PPI 14. Probable right renal complex cyst: Seen on renal ultrasound. Consider outpatient Urology consultation. 15. Left breast mass: Seems like it's being worked up as outpatient. Noted on mammography and workup ongoing/ultrasound-guided biopsy pending.   Consultants:  None   Procedures:  None   Discharge Instructions  Discharge Instructions    (HEART FAILURE PATIENTS) Call MD:  Anytime you have any of the following symptoms: 1) 3 pound weight gain in 24 hours or 5 pounds in 1 week 2) shortness of breath, with or  without a dry hacking cough 3) swelling in the hands, feet or stomach 4) if you have to sleep on extra pillows at night in order to breathe.    Complete by:  As directed    Call MD for:    Complete by:  As directed    Passing out or feel like passing out.   Call MD for:  difficulty breathing, headache or visual disturbances    Complete by:  As directed    Call MD for:  extreme fatigue    Complete by:  As directed    Call MD for:  persistant dizziness or light-headedness    Complete by:  As directed    Call MD for:  redness, tenderness, or signs of infection (pain, swelling, redness, odor or green/yellow discharge around incision site)    Complete by:  As directed    Call MD for:  severe uncontrolled pain    Complete by:  As directed    Call MD for:  temperature >100.4    Complete by:  As directed    Diet - low sodium heart healthy    Complete by:  As directed    Diet Carb Modified    Complete by:  As directed    Discharge instructions    Complete by:  As directed    Continue to use bilateral lower extremity thigh-high compression stockings especially when up and about.   Discharge wound care:    Complete by:  As directed    As per instructions provided to you by the wound care nurse from the hospital.  Cleansing with peri spray and using purple top [employer] Barrier Cream.  Insructed husband in using this barrier twice a day once home.   Increase activity  slowly    Complete by:  As directed        Medication List    STOP taking these medications   furosemide 20 MG tablet Commonly known as:  LASIX   metoprolol tartrate 25 MG tablet Commonly known as:  LOPRESSOR     TAKE these medications   acetaminophen 325 MG tablet Commonly known as:  TYLENOL Take 2 tablets (650 mg total) by mouth every 6 (six) hours as needed for mild pain, moderate pain, fever or headache. What changed:  reasons to take this   aspirin 81 MG tablet Take 81 mg by mouth daily.   atorvastatin 10 MG  tablet Commonly known as:  LIPITOR Take 10 mg by mouth daily.   cephALEXin 500 MG capsule Commonly known as:  KEFLEX Take 1 capsule (500 mg total) by mouth 2 (two) times daily.   diltiazem 60 MG tablet Commonly known as:  CARDIZEM Take 1 tablet (60 mg total) by mouth 2 (two) times daily. What changed:  medication strength  how much to take   divalproex 250 MG 24 hr tablet Commonly known as:  DEPAKOTE ER Take 3 tablets (750 mg total) by mouth daily.   hydrOXYzine 25 MG tablet Commonly known as:  ATARAX/VISTARIL TAKE 1 TABLET BY MOUTH AT BEDTIME AS NEEDED FOR ITCHING   LEVEMIR FLEXTOUCH 100 UNIT/ML Pen Generic drug:  Insulin Detemir INJECT 8 UNITS SUBCUTANEOUSLY DAILY AT NIGHT   Magnesium 500 MG Tabs Take 1 tablet (500 mg total) by mouth 2 (two) times daily.   metoprolol succinate 25 MG 24 hr tablet Commonly known as:  TOPROL-XL Take 1 tablet (25 mg total) by mouth daily.   multivitamin capsule Take 1 capsule by mouth daily.   NOVOLOG FLEXPEN 100 UNIT/ML FlexPen Generic drug:  insulin aspart Inject 4-9 Units into the skin 3 (three) times daily with meals. Per sliding scale, 4 units if over 200 = 5 units in the morning and at lunch and 8 units if over 200 = 9 units.   omeprazole 20 MG capsule Commonly known as:  PRILOSEC Take 20 mg by mouth every morning.   polyethylene glycol packet Commonly known as:  MIRALAX / GLYCOLAX Take 17 g by mouth daily as needed for moderate constipation.   vitamin C 500 MG tablet Commonly known as:  ASCORBIC ACID Take 500 mg by mouth daily.      Follow-up Information    Little, James, MD. Schedule an appointment as soon as possible for a visit in 3 day(s).   Specialty:  Family Medicine Why:  To be seen with repeat labs (CBC with differential & BMP). Contact information: [place] [address] [phone]        Garvin Fila, MD. Schedule an appointment as soon as possible for a visit in 1 week(s).    Specialties:  Neurology, Radiology Contact information: [address] [place] [address] [phone]          Allergies  Allergen Reactions  . Barbiturates     Becomes restless.  Also with all "strong" medications like sleeping pills  . Latex Itching      Procedures/Studies: Dg Chest 2 View  Result Date: 06/05/2017 CLINICAL DATA:  Fever, weakness for 2 days. History of stroke, hypertension, diabetes. EXAM: CHEST  2 VIEW COMPARISON:  Chest x-ray dated 11/15/2016. FINDINGS: Heart size and mediastinal contours are within normal limits. Atherosclerotic changes noted at the aortic arch. Lungs are clear. No pleural effusion  or pneumothorax seen. No acute or suspicious osseous finding. Calcifications about both shoulders suggests chronic calcific tendinopathy. IMPRESSION: No active cardiopulmonary disease. No evidence of pneumonia or pulmonary edema. Aortic atherosclerosis. Electronically Signed   By: Franki Cabot M.D.   On: 06/05/2017 23:39   US Renal  Result Date: 06/06/2017 CLINICAL DATA:  76 year old female with acute renal insufficiency, altered mental status, and fever. EXAM: RENAL / URINARY TRACT ULTRASOUND COMPLETE COMPARISON:  Ultrasound dated 05/12/2017 FINDINGS: Right Kidney: Length: 8.7 cm. There is mild increased echogenicity and parenchymal thinning. There is mild right hydronephrosis. There is a 1.0 x 0.7 x 1.0 cm hypoechoic lesion in the upper pole, likely a complex cyst, similar to prior ultrasound. Left Kidney: Length: 9.4 cm. Mild increased echogenicity. There is parenchymal atrophy and cortical thinning. There is a 4.2 x 3.2 x 2.7 cm inferior pole cyst. No hydronephrosis or echogenic stone. Bladder: Appears normal for degree of bladder distention. IMPRESSION: 1. Mild increased echogenicity and parenchymal atrophy. 2. Mild right hydronephrosis.  No obstructing stone. 3. Left renal cyst.  Probable right renal complex cyst. 4. Unremarkable urinary bladder.  Electronically Signed   By: Anner Crete M.D.   On: 06/06/2017 01:26   US Breast Ltd Uni Left Inc Axilla  Result Date: 06/05/2017 CLINICAL DATA:  Recall from screening mammography with tomosynthesis, possible mass or focal asymmetry in the upper inner subareolar left breast. EXAM: 2D DIGITAL DIAGNOSTIC LEFT MAMMOGRAM WITH ADJUNCT TOMO ULTRASOUND LEFT BREAST COMPARISON:  Mammography 06/01/2017, 05/29/2016 and earlier. Left breast ultrasound 03/24/2011 was performed in a different part of the breast. ACR Breast Density Category d: The breast tissue is extremely dense, which lowers the sensitivity of mammography. FINDINGS: Standard and tomosynthesis spot compression CC and MLO views of the area of concern in the left breast were obtained. The spot compression MLO views demonstrates a circumscribed oval medium density mass measuring approximately 6 mm without associated architectural distortion or suspicious calcification. This is not visualized on the spot compression CC view. On physical exam, there is no palpable abnormality in the upper inner periareolar left breast. Targeted left breast ultrasound is performed, showing a possible intraductal mass at the 10 o'clock position approximately 1 cm from the nipple measuring approximately 3 x 7 x 7 mm. There is no internal power Doppler flow. Sonographic evaluation the left axilla demonstrates no pathologic lymphadenopathy. IMPRESSION: 1. Approximate 7 mm possible intraductal mass in the upper inner subareolar left breast which accounts for the area of concern on screening mammography. 2. No pathologic left axillary lymphadenopathy. RECOMMENDATION: Ultrasound-guided core needle biopsy of the possible intraductal mass in the left breast in order to exclude a high risk papilloma or DCIS. The ultrasound biopsy procedure was discussed with the patient and her questions were answered. She has agreed to proceed and the biopsy has been scheduled for Friday, August 10 at  3:45 p.m. I have discussed the findings and recommendations with the patient. Results were also provided in writing at the conclusion of the visit. BI-RADS CATEGORY  4: Suspicious. Electronically Signed   By: Evangeline Dakin M.D.   On: 06/05/2017 16:37   Mm Diag Breast Tomo Uni Left  Result Date: 06/05/2017 CLINICAL DATA:  Recall from screening mammography with tomosynthesis, possible mass or focal asymmetry in the upper inner subareolar left breast. EXAM: 2D DIGITAL DIAGNOSTIC LEFT MAMMOGRAM WITH ADJUNCT TOMO ULTRASOUND LEFT BREAST COMPARISON:  Mammography 06/01/2017, 05/29/2016 and earlier. Left breast ultrasound 03/24/2011 was performed in a different part of the breast. ACR Breast  Density Category d: The breast tissue is extremely dense, which lowers the sensitivity of mammography. FINDINGS: Standard and tomosynthesis spot compression CC and MLO views of the area of concern in the left breast were obtained. The spot compression MLO views demonstrates a circumscribed oval medium density mass measuring approximately 6 mm without associated architectural distortion or suspicious calcification. This is not visualized on the spot compression CC view. On physical exam, there is no palpable abnormality in the upper inner periareolar left breast. Targeted left breast ultrasound is performed, showing a possible intraductal mass at the 10 o'clock position approximately 1 cm from the nipple measuring approximately 3 x 7 x 7 mm. There is no internal power Doppler flow. Sonographic evaluation the left axilla demonstrates no pathologic lymphadenopathy. IMPRESSION: 1. Approximate 7 mm possible intraductal mass in the upper inner subareolar left breast which accounts for the area of concern on screening mammography. 2. No pathologic left axillary lymphadenopathy. RECOMMENDATION: Ultrasound-guided core needle biopsy of the possible intraductal mass in the left breast in order to exclude a high risk papilloma or DCIS. The  ultrasound biopsy procedure was discussed with the patient and her questions were answered. She has agreed to proceed and the biopsy has been scheduled for Friday, August 10 at 3:45 p.m. I have discussed the findings and recommendations with the patient. Results were also provided in writing at the conclusion of the visit. BI-RADS CATEGORY  4: Suspicious. Electronically Signed   By: Evangeline Dakin M.D.   On: 06/05/2017 16:37   Mm Screening Breast Tomo Bilateral  Result Date: 06/02/2017 CLINICAL DATA:  Screening. EXAM: 2D DIGITAL SCREENING BILATERAL MAMMOGRAM WITH CAD AND ADJUNCT TOMO COMPARISON:  Previous exam(s). ACR Breast Density Category c: The breast tissue is heterogeneously dense, which may obscure small masses. FINDINGS: In the left breast, a possible asymmetry warrants further evaluation. In the right breast, no findings suspicious for malignancy. Images were processed with CAD. IMPRESSION: Further evaluation is suggested for possible asymmetry in the left breast. RECOMMENDATION: Diagnostic mammogram and possibly ultrasound of the left breast. (Code:[id number]-[id number]) The patient will be contacted regarding the findings, and additional imaging will be scheduled. BI-RADS CATEGORY  0: Incomplete. Need additional imaging evaluation and/or prior mammograms for comparison. Electronically Signed   By: Fidela Salisbury M.D.   On: 06/02/2017 11:08      Subjective: Seen this morning. Seems slightly more coherent today than yesterday. Oriented to person and partly to place. Follow simple instructions. Denies complaints. Denies pain. Spouse at bedside indicates that she seems slightly less confused than yesterday. Interviewed and examined with patient's RN in the room.  Discharge Exam:  Vitals:   06/10/17 1732 06/10/17 2105 06/11/17 0514 06/11/17 0726  BP: (!) 144/59 140/63 (!) 149/55 (!) 150/63  Pulse: (!) 110 (!) 110 (!) 116 (!) 120  Resp: 18  18 20   Temp: 98.5 F (36.9 C) 98.7 F (37.1 C) 99.9 F  (37.7 C) 97.6 F (36.4 C)  TempSrc: Oral   Oral  SpO2: 96% 96% 96% 97%  Weight:  68.5 kg (151 lb 0.2 oz)    Height:        General exam: Pleasant elderly female, lying comfortably propped up in bed.Does not look septic or toxic. Respiratory system: Clear to auscultation. Respiratory effort normal. Cardiovascular system: S1 & S2 heard, regular mild tachycardia. No JVD, murmurs, rubs, gallops or clicks. No pedal edema. Telemetry: Sinus tachycardia in the 100s noted this morning. Gastrointestinal system: Abdomen is nondistended, soft and nontender. No organomegaly or  masses felt. Normal bowel sounds heard. Central nervous system:  Mental status as indicated above. No focal neurological deficits. Extremities: Symmetric 5 x 5 power. Resting tremors,? Pill-rolling movement of hands. Tremors are much less today than yesterday. Skin: Moisture associated skin damage between her buttocks cheeks. No other acute findings. Psychiatry: Judgement and insight impaired. Mood & affect flat.     The results of significant diagnostics from this hospitalization (including imaging, microbiology, ancillary and laboratory) are listed below for reference.     Microbiology: Recent Results (from the past 240 hour(s))  Culture, blood (Routine x 2)     Status: Abnormal   Collection Time: 06/05/17 10:35 PM  Result Value Ref Range Status   Specimen Description BLOOD RIGHT ANTECUBITAL  Final   Special Requests   Final    BOTTLES DRAWN AEROBIC AND ANAEROBIC Blood Culture adequate volume   Culture  Setup Time   Final    GRAM NEGATIVE RODS IN BOTH AEROBIC AND ANAEROBIC BOTTLES CRITICAL RESULT CALLED TO, READ BACK BY AND VERIFIED WITH: R RUMBARGER,PHARMD AT 1149 06/06/17 BY L BENFIELD    Culture ESCHERICHIA COLI (A)  Final   Report Status 06/08/2017 FINAL  Final   Organism ID, Bacteria ESCHERICHIA COLI  Final      Susceptibility   Escherichia coli - MIC*    AMPICILLIN 4 SENSITIVE Sensitive     CEFAZOLIN <=4  SENSITIVE Sensitive     CEFEPIME <=1 SENSITIVE Sensitive     CEFTAZIDIME <=1 SENSITIVE Sensitive     CEFTRIAXONE <=1 SENSITIVE Sensitive     CIPROFLOXACIN <=0.25 SENSITIVE Sensitive     GENTAMICIN <=1 SENSITIVE Sensitive     IMIPENEM <=0.25 SENSITIVE Sensitive     TRIMETH/SULFA <=20 SENSITIVE Sensitive     AMPICILLIN/SULBACTAM <=2 SENSITIVE Sensitive     PIP/TAZO <=4 SENSITIVE Sensitive     Extended ESBL NEGATIVE Sensitive     * ESCHERICHIA COLI  Blood Culture ID Panel (Reflexed)     Status: Abnormal   Collection Time: 06/05/17 10:35 PM  Result Value Ref Range Status   Enterococcus species NOT DETECTED NOT DETECTED Final   Listeria monocytogenes NOT DETECTED NOT DETECTED Final   Staphylococcus species NOT DETECTED NOT DETECTED Final   Staphylococcus aureus NOT DETECTED NOT DETECTED Final   Streptococcus species NOT DETECTED NOT DETECTED Final   Streptococcus agalactiae NOT DETECTED NOT DETECTED Final   Streptococcus pneumoniae NOT DETECTED NOT DETECTED Final   Streptococcus pyogenes NOT DETECTED NOT DETECTED Final   Acinetobacter baumannii NOT DETECTED NOT DETECTED Final   Enterobacteriaceae species DETECTED (A) NOT DETECTED Final    Comment: Enterobacteriaceae represent a large family of gram-negative bacteria, not a single organism. CRITICAL RESULT CALLED TO, READ BACK BY AND VERIFIED WITH: R RUMBARGER,PHARMD AT 1149 06/06/17 BY L BENFIELD    Enterobacter cloacae complex NOT DETECTED NOT DETECTED Final   Escherichia coli DETECTED (A) NOT DETECTED Final    Comment: CRITICAL RESULT CALLED TO, READ BACK BY AND VERIFIED WITH: R RUMBARGER,PHARMD AT 1149 06/06/17 BY L BENFIELD    Klebsiella oxytoca NOT DETECTED NOT DETECTED Final   Klebsiella pneumoniae NOT DETECTED NOT DETECTED Final   Proteus species NOT DETECTED NOT DETECTED Final   Serratia marcescens NOT DETECTED NOT DETECTED Final   Carbapenem resistance NOT DETECTED NOT DETECTED Final   Haemophilus influenzae NOT DETECTED NOT  DETECTED Final   Neisseria meningitidis NOT DETECTED NOT DETECTED Final   Pseudomonas aeruginosa NOT DETECTED NOT DETECTED Final   Candida albicans NOT DETECTED NOT  DETECTED Final   Candida glabrata NOT DETECTED NOT DETECTED Final   Candida krusei NOT DETECTED NOT DETECTED Final   Candida parapsilosis NOT DETECTED NOT DETECTED Final   Candida tropicalis NOT DETECTED NOT DETECTED Final  Culture, blood (Routine x 2)     Status: None   Collection Time: 06/05/17 10:47 PM  Result Value Ref Range Status   Specimen Description BLOOD LEFT FOREARM  Final   Special Requests IN PEDIATRIC BOTTLE Blood Culture adequate volume  Final   Culture NO GROWTH 5 DAYS  Final   Report Status 06/10/2017 FINAL  Final  Urine culture     Status: Abnormal   Collection Time: 06/05/17 11:03 PM  Result Value Ref Range Status   Specimen Description URINE, CATHETERIZED  Final   Special Requests NONE  Final   Culture <10,000 COLONIES/mL INSIGNIFICANT GROWTH (A)  Final   Report Status 06/07/2017 FINAL  Final     Labs: CBC:  Recent Labs Lab 06/05/17 2234 06/05/17 2320 06/06/17 0139 06/07/17 0419 06/08/17 0500 06/11/17 0412  WBC 15.5*  --  17.6* 17.6* 15.4* 17.1*  NEUTROABS  --  11.9* 12.1*  --   --   --   HGB 11.2*  --  10.5* 11.3* 11.0* 9.9*  HCT 34.6*  --  32.7* 36.4 34.2* 31.3*  MCV 101.2*  --  100.6* 101.7* 98.8 101.0*  PLT 192  --  162 195 217 413   Basic Metabolic Panel:  Recent Labs Lab 06/06/17 0139 06/07/17 0419 06/08/17 0500 06/09/17 1014 06/11/17 0412  NA 139 140 139 138 140  K 4.4 3.7 3.8 4.0 3.9  CL 105 101 102 101 98*  CO2 25 23 28 26 30   GLUCOSE 239* 189* 178* 249* 299*  BUN 37* 38* 35* 31* 30*  CREATININE 1.88* 2.19* 1.93* 1.69* 1.64*  CALCIUM 7.8* 8.8* 8.8* 8.5* 8.8*   Liver Function Tests:  Recent Labs Lab 06/05/17 2234  AST 27  ALT 13*  ALKPHOS 116  BILITOT 0.7  PROT 6.7  ALBUMIN 3.1*   CBG:  Recent Labs Lab 06/10/17 1210 06/10/17 1606 06/10/17 2103  06/11/17 0722 06/11/17 1150  GLUCAP 204* 192* 335* 270* 278*    Urinalysis    Component Value Date/Time   COLORURINE STRAW (A) 06/05/2017 2301   APPEARANCEUR CLEAR 06/05/2017 2301   LABSPEC 1.011 06/05/2017 2301   PHURINE 8.0 06/05/2017 2301   GLUCOSEU NEGATIVE 06/05/2017 2301   HGBUR NEGATIVE 06/05/2017 2301   BILIRUBINUR NEGATIVE 06/05/2017 2301   BILIRUBINUR neg 08/16/2012 1531   KETONESUR NEGATIVE 06/05/2017 2301   PROTEINUR 30 (A) 06/05/2017 2301   UROBILINOGEN 0.2 06/25/2013 1002   NITRITE NEGATIVE 06/05/2017 2301   LEUKOCYTESUR NEGATIVE 06/05/2017 2301    Discussed in detail with patient's spouse at bedside. Updated care and answered questions. Again presented option of discharging to SNF for close monitoring, management and therapies but he declined.  Time coordinating discharge: Over 30 minutes  SIGNED:  Vernell Leep, MD, FACP, [place]. [facility] Pager [phone]  If 7PM-7AM, please contact night-coverage [email or web address] Password [email or web address] 06/11/2017, 2:30 PM

## 2017-06-11 NOTE — ED Provider Notes (Signed)
[facility] DEPT Provider Note   CSN: [id number] Arrival date & time: 06/11/17  1857     History   Chief Complaint Chief Complaint  Patient presents with  . Loss of Consciousness    HPI [name] [name] is a 76 y.o. female.  HPI   76 yo F with PMHx sepsis, renal insuffiency, HTN, HLD, recent admission for E. Coli sepsis here with LOC. Pt was just admitted for E. Coli bacteremia and was placed on keflex. She was discharged today. Per report from pt and husband, she felt generally weak when she returned home. She was unable to stand and return to her house. She was carried inside and was sitting down when she lost consciousness while trying to get up. Lasted <5 min ,no seizure like activity. She was too weak to get off the couch so EMS called. Currently pt states she "feels bad." No specific pain.  Past Medical History:  Diagnosis Date  . Anemia   . Arthritis    "a touch in my hands" (11/17/2016)  . Chronic back pain   . Chronic kidney disease (CKD), active medical management without dialysis, stage 3 (moderate)   . Depression   . GERD (gastroesophageal reflux disease)   . High cholesterol   . Hyperlipidemia   . Hypertension   . Seizures ([place])    last seizure 2014 (11/17/2016)  . Stroke [facility]) 2013   Secondary to acute right posterior temporo-occipital intra-axial hemorrhage  . Type II diabetes mellitus [facility])     Patient Active Problem List   Diagnosis Date Noted  . CAP (community acquired pneumonia) 06/12/2017  . Sepsis ([place]) 06/11/2017  . Weakness 06/11/2017  . Anemia 06/11/2017  . AKI (acute kidney injury) ([place]) 06/05/2017  . Sepsis, unspecified organism ([place]) 06/05/2017  . Partial symptomatic epilepsy with complex partial seizures, not intractable, without status epilepticus ([place])   . Bacteremia due to Escherichia coli 11/18/2016  . Renal insufficiency   . Aortic atherosclerosis ([place]) 11/15/2016  . UTI (urinary tract infection) 11/15/2016  . Paranoia ([place]) 05/24/2015   . Tachycardia 07/02/2013  . Hypophosphatemia 07/02/2013  . Encounter for central line placement 07/02/2013  . Hypokalemia 07/01/2013  . Hypomagnesemia 07/01/2013  . Protein-calorie malnutrition, severe ([place]) 06/29/2013  . Leukocytosis, unspecified 06/29/2013  . 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 TIA (transient ischemic attack) 12/13/2012  . 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 Syncope 09/08/2012  . Acute respiratory failure with hypoxia ([place]) 08/20/2012  . Seizure disorder, grand mal ([place]) 08/20/2012  . Seizure ([place]) 08/20/2012  . CKD (chronic kidney disease) stage 3, GFR 30-59 ml/min 08/19/2012  . Leukocytosis 08/19/2012  . Hemorrhage in the brain-13 x 22 x 14 mm right posterior temporo-occipital intra-axial acute 08/19/2012  . Left hip pain 08/19/2012  . Left hemiparesis ([place]) 08/19/2012  . Hemianopia, homonymous, left 08/19/2012  . Hemisensory deficit, left 08/19/2012  . Nausea 08/19/2012  . Headache(784.0) 08/19/2012  . Acute encephalopathy 08/19/2012  . Hemorrhage of brain, nontraumatic ([place]) 08/18/2012  . Dysphagia 08/18/2012  . HTN (hypertension) 08/18/2012  . DM type 2 (diabetes mellitus, type 2) ([place]) 07/26/2012  . Hypoglycemia due to insulin 07/26/2012  . GERD (gastroesophageal reflux disease)     Past Surgical History:  Procedure Laterality Date  . CATARACT EXTRACTION W/ INTRAOCULAR LENS IMPLANT Bilateral   . COLONOSCOPY    . DILATION AND CURETTAGE OF UTERUS  X 3  . DIRECT LARYNGOSCOPY Left 12/12/2012   Procedure: DIRECT LARYNGOSCOPY with excision of laryngeal  mass;  Surgeon: Ascencion Dike, MD;  Location: [facility] OR;  Service: ENT;  Laterality: Left;    OB History    No data available       Home Medications    Prior to Admission medications   Medication Sig Start Date End Date Taking? Authorizing Provider  acetaminophen (TYLENOL) 325 MG tablet Take 2 tablets (650 mg total) by mouth every 6 (six) hours as needed for mild pain,  moderate pain, fever or headache. 06/11/17  Yes Hongalgi, Lenis Dickinson, MD  aspirin 81 MG tablet Take 81 mg by mouth daily.   Yes [provider]  atorvastatin (LIPITOR) 10 MG tablet Take 10 mg by mouth daily.  02/11/13  Yes [provider]  divalproex (DEPAKOTE ER) 250 MG 24 hr tablet Take 3 tablets (750 mg total) by mouth daily. 12/23/16  Yes Garvin Fila, MD  LEVEMIR FLEXTOUCH 100 UNIT/ML Pen INJECT 8 UNITS SUBCUTANEOUSLY DAILY AT NIGHT 05/14/15  Yes [provider]  Magnesium 500 MG TABS Take 1 tablet (500 mg total) by mouth 2 (two) times daily. 06/11/17  Yes Hongalgi, Lenis Dickinson, MD  Multiple Vitamin (MULTIVITAMIN) capsule Take 1 capsule by mouth daily.   Yes [provider]  NOVOLOG FLEXPEN 100 UNIT/ML SOPN FlexPen Inject 4-9 Units into the skin 3 (three) times daily with meals. Per sliding scale, 4 units if over 200 = 5 units in the morning and at lunch and 8 units if over 200 = 9 units. 07/29/13  Yes [provider]  omeprazole (PRILOSEC) 20 MG capsule Take 20 mg by mouth every morning.  03/17/15  Yes [provider]  vitamin C (ASCORBIC ACID) 500 MG tablet Take 500 mg by mouth daily.   Yes [provider]  cephALEXin (KEFLEX) 500 MG capsule Take 1 capsule (500 mg total) by mouth 2 (two) times daily. 06/11/17   Hongalgi, Lenis Dickinson, MD  diltiazem (CARDIZEM) 60 MG tablet Take 1 tablet (60 mg total) by mouth 2 (two) times daily. 06/11/17   Hongalgi, Lenis Dickinson, MD  hydrOXYzine (ATARAX/VISTARIL) 25 MG tablet TAKE 1 TABLET BY MOUTH AT BEDTIME AS NEEDED FOR ITCHING 04/25/15   [provider]  metoprolol succinate (TOPROL-XL) 25 MG 24 hr tablet Take 1 tablet (25 mg total) by mouth daily. 06/12/17   Hongalgi, Lenis Dickinson, MD  polyethylene glycol (MIRALAX / GLYCOLAX) packet Take 17 g by mouth daily as needed for moderate constipation. 06/11/17   Hongalgi, Lenis Dickinson, MD    Family History Family History  Problem Relation Age of Onset  . Cancer Mother   . Colon  cancer Mother   . Liver disease Mother   . Breast cancer Mother   . Cancer Father   . Diabetes Paternal Grandmother   . Breast cancer Maternal Aunt   . Breast cancer Maternal Aunt   . Esophageal cancer Neg Hx   . Rectal cancer Neg Hx   . Stomach cancer Neg Hx     Social History Social History  Substance Use Topics  . Smoking status: Never Smoker  . Smokeless tobacco: Never Used  . Alcohol use No     Comment: patient drinks caffeinated drinks.      Allergies   Barbiturates and Latex   Review of Systems Review of Systems  Constitutional: Positive for chills and fatigue.  Gastrointestinal: Positive for nausea.  Musculoskeletal: Positive for gait problem.  Neurological: Positive for weakness.  All other systems reviewed and are negative.    Physical Exam  Updated Vital Signs BP (!) 151/61 (BP Location: Left Arm)   Pulse 99   Temp 98.5 F (36.9 C) (Oral)   Resp 18   Ht 5\' 5"  (1.651 m)   Wt 66 kg (145 lb 9.6 oz)   SpO2 96%   BMI 24.23 kg/m   Physical Exam  Constitutional: She is oriented to person, place, and time. She appears well-developed and well-nourished. No distress.  HENT:  Head: Normocephalic and atraumatic.  Dry mucous membranes  Eyes: Conjunctivae are normal.  Neck: Neck supple.  Cardiovascular: Regular rhythm and normal heart sounds.  Tachycardia present.  Exam reveals no friction rub.   No murmur heard. Pulmonary/Chest: Effort normal and breath sounds normal. No respiratory distress. She has no wheezes. She has no rales.  Abdominal: She exhibits no distension.  Musculoskeletal: She exhibits no edema.  Neurological: She is alert and oriented to person, place, and time. She exhibits normal muscle tone.  Skin: Skin is warm. Capillary refill takes less than 2 seconds.  Psychiatric: She has a normal mood and affect.  Nursing note and vitals reviewed.    ED Treatments / Results  Labs (all labs ordered are listed, but only abnormal results are  displayed) Labs Reviewed  CBC WITH DIFFERENTIAL/PLATELET - Abnormal; Notable for the following:       Result Value   WBC 18.8 (*)    RBC 2.95 (*)    Hemoglobin 9.8 (*)    HCT 29.3 (*)    Neutro Abs 13.9 (*)    Monocytes Absolute 2.9 (*)    All other components within normal limits  COMPREHENSIVE METABOLIC PANEL - Abnormal; Notable for the following:    Glucose, Bld 342 (*)    BUN 38 (*)    Creatinine, Ser 1.84 (*)    Calcium 8.6 (*)    Total Protein 6.3 (*)    Albumin 2.2 (*)    GFR calc non Af Amer 26 (*)    GFR calc Af Amer 30 (*)    All other components within normal limits  URINALYSIS, ROUTINE W REFLEX MICROSCOPIC - Abnormal; Notable for the following:    Glucose, UA >=500 (*)    Hgb urine dipstick MODERATE (*)    Ketones, ur 5 (*)    Protein, ur 30 (*)    Leukocytes, UA LARGE (*)    Bacteria, UA RARE (*)    All other components within normal limits  VALPROIC ACID LEVEL - Abnormal; Notable for the following:    Valproic Acid Lvl 27 (*)    All other components within normal limits  BASIC METABOLIC PANEL - Abnormal; Notable for the following:    Potassium 3.4 (*)    Glucose, Bld 244 (*)    BUN 32 (*)    Creatinine, Ser 1.41 (*)    Calcium 8.6 (*)    GFR calc non Af Amer 35 (*)    GFR calc Af Amer 41 (*)    All other components within normal limits  CBC - Abnormal; Notable for the following:    WBC 17.0 (*)    RBC 3.17 (*)    Hemoglobin 10.3 (*)    HCT 31.7 (*)    All other components within normal limits  GLUCOSE, CAPILLARY - Abnormal; Notable for the following:    Glucose-Capillary 291 (*)    All other components within normal limits  GLUCOSE, CAPILLARY - Abnormal; Notable for the following:    Glucose-Capillary 228 (*)    All other components within  normal limits  GLUCOSE, CAPILLARY - Abnormal; Notable for the following:    Glucose-Capillary 298 (*)    All other components within normal limits  GLUCOSE, CAPILLARY - Abnormal; Notable for the following:     Glucose-Capillary 189 (*)    All other components within normal limits  CULTURE, BLOOD (ROUTINE X 2)  CULTURE, BLOOD (ROUTINE X 2)  URINE CULTURE  CBC  BASIC METABOLIC PANEL  I-STAT CG4 LACTIC ACID, ED  I-STAT TROPONIN, ED  CG4 I-STAT (LACTIC ACID)    EKG  EKG Interpretation  Date/Time:  Thursday June 11 2017 19:18:26 EDT Ventricular Rate:  118 PR Interval:    QRS Duration: 121 QT Interval:  359 QTC Calculation: 503 R Axis:   36 Text Interpretation:  Right bundle branch block similar to prior Confirmed by Zenovia Jarred [phone]) on 06/12/2017 1:11:12 PM       Radiology Dg Chest 1 View  Result Date: 06/11/2017 CLINICAL DATA:  Syncope. EXAM: CHEST 1 VIEW COMPARISON:  06/05/2017 and prior chest radiographs FINDINGS: The cardiomediastinal silhouette is unremarkable. There is no evidence of focal airspace disease, pulmonary edema, suspicious pulmonary nodule/mass, pleural effusion, or pneumothorax. No acute bony abnormalities are identified. IMPRESSION: No active disease. Electronically Signed   By: Margarette Canada M.D.   On: 06/11/2017 20:32   Ct Head Wo Contrast  Result Date: 06/11/2017 CLINICAL DATA:  Syncope. EXAM: CT HEAD WITHOUT CONTRAST TECHNIQUE: Contiguous axial images were obtained from the base of the skull through the vertex without intravenous contrast. COMPARISON:  11/15/2016 FINDINGS: Brain: There is no evidence for acute hemorrhage, hydrocephalus, mass lesion, or abnormal extra-axial fluid collection. No definite CT evidence for acute infarction. Diffuse loss of parenchymal volume is consistent with atrophy. Patchy low attenuation in the deep hemispheric and periventricular white matter is nonspecific, but likely reflects chronic microvascular ischemic demyelination. Vascular: No hyperdense vessel or unexpected calcification. Skull: No evidence for fracture. No worrisome lytic or sclerotic lesion. Sinuses/Orbits: The visualized paranasal sinuses and mastoid air cells are  clear. Visualized portions of the globes and intraorbital fat are unremarkable. Other: As noted on prior studies, there is a soft tissue nodule measuring up to about 18 mm, incompletely visualize, in the posterior left nose, superficial to the medial aspect of the left maxillary sinus. IMPRESSION: 1. Stable.  No acute intracranial abnormality. 2. Atrophy with chronic small vessel white matter ischemic disease. Electronically Signed   By: Misty Stanley M.D.   On: 06/11/2017 20:25    Procedures Procedures (including critical care time)  Medications Ordered in ED Medications  cephALEXin (KEFLEX) capsule 500 mg (500 mg Oral Given 06/12/17 1011)  diltiazem (CARDIZEM) tablet 60 mg (60 mg Oral Given 06/12/17 1007)  magnesium gluconate (MAGONATE) tablet 500 mg (500 mg Oral Given 06/12/17 1008)  metoprolol succinate (TOPROL-XL) 24 hr tablet 25 mg (25 mg Oral Given 06/12/17 1009)  polyethylene glycol (MIRALAX / GLYCOLAX) packet 17 g (not administered)  divalproex (DEPAKOTE ER) 24 hr tablet 750 mg (750 mg Oral Given 06/12/17 1008)  multivitamin with minerals tablet 1 tablet (1 tablet Oral Given 06/12/17 1007)  aspirin EC tablet 81 mg (81 mg Oral Given 06/12/17 1009)  hydrOXYzine (ATARAX/VISTARIL) tablet 25 mg (not administered)  insulin detemir (LEVEMIR) injection 8 Units (not administered)  pantoprazole (PROTONIX) EC tablet 40 mg (40 mg Oral Given 06/12/17 1007)  atorvastatin (LIPITOR) tablet 10 mg (10 mg Oral Given 06/12/17 1721)  vitamin C (ASCORBIC ACID) tablet 500 mg (500 mg Oral Given 06/12/17 1009)  acetaminophen (TYLENOL)  tablet 650 mg (not administered)    Or  acetaminophen (TYLENOL) suppository 650 mg (not administered)  docusate sodium (COLACE) capsule 100 mg (100 mg Oral Given 06/12/17 1007)  ondansetron (ZOFRAN) tablet 4 mg (not administered)    Or  ondansetron (ZOFRAN) injection 4 mg (not administered)  enoxaparin (LOVENOX) injection 30 mg (30 mg Subcutaneous Given 06/12/17 1009)  sodium chloride  flush (NS) 0.9 % injection 3 mL (3 mLs Intravenous Not Given 06/12/17 1000)  insulin aspart (novoLOG) injection 0-15 Units (3 Units Subcutaneous Given 06/12/17 1721)  insulin aspart (novoLOG) injection 0-5 Units (3 Units Subcutaneous Given 06/12/17 0217)  feeding supplement (ENSURE ENLIVE) (ENSURE ENLIVE) liquid 237 mL (237 mLs Oral Given 06/12/17 1500)  0.9 %  sodium chloride infusion ( Intravenous New Bag/Given 06/12/17 1722)  sodium chloride 0.9 % bolus 1,000 mL (0 mLs Intravenous Stopped 06/12/17 0053)  sodium chloride 0.9 % bolus 1,000 mL (0 mLs Intravenous Stopped 06/12/17 0053)  cefTRIAXone (ROCEPHIN) 2 g in dextrose 5 % 50 mL IVPB (0 g Intravenous Stopped 06/11/17 2217)  docusate sodium (COLACE) 100 MG capsule (  Given 06/12/17 0215)  cephALEXin (KEFLEX) 500 MG capsule (500 mg  Given 06/12/17 0216)  diltiazem (CARDIZEM) 60 MG tablet (  Given 06/12/17 0215)  potassium chloride SA (K-DUR,KLOR-CON) CR tablet 40 mEq (40 mEq Oral Given 06/12/17 1607)     Initial Impression / Assessment and Plan / ED Course  I have reviewed the triage vital signs and the nursing notes.  Pertinent labs & imaging results that were available during my care of the patient were reviewed by me and considered in my medical decision making (see chart for details).     76 yo F with recent admission for sepsis here with tachycardia, weakness, and likely orthostatic syncope. Labs show increasing leukocytosis. Pt markedly weak on exam. Will give fluids given tachycardia and orthostatic sx, start rocephin based on BCx, and re-admit for PT/OT possible placement.  Final Clinical Impressions(s) / ED Diagnoses   Final diagnoses:  Sepsis, due to unspecified organism [facility])    New Prescriptions Current Discharge Medication List       Duffy Bruce, MD 06/12/17 1734

## 2017-06-11 NOTE — Discharge Instructions (Signed)

## 2017-06-11 NOTE — Care Management Note (Signed)
Case Management Note  Patient Details  Name: [name] [name] MRN: [id number] Date of Birth: Sep 13, 1941  Subjective/Objective:      CM following for progression and d/c planning.               Action/Plan: 06/11/2017 Met with pt and husband re [facility] services. Per Mr Debellis this pt has used [facility] in the past for [facility] services and pt agrees that she would like to use that agency again. [place] notified of need for [facility] services. Per pt husband pt has no further DME needs at this time.  Expected Discharge Date:  06/11/17               Expected Discharge Plan:  [place]  In-House Referral:  NA  Discharge planning Services  CM Consult  Post Acute Care Choice:  Home Health Choice offered to:  Patient  DME Arranged:  N/A DME Agency:  NA  HH Arranged:  RN, PT, OT, Nurse's Aide, Social Work [employer] Agency:  [place]  Status of Service:  Completed, signed off  If discussed at [employer], dates discussed:    Additional Comments:  Adron Bene, RN 06/11/2017, 2:20 PM

## 2017-06-11 NOTE — ED Notes (Signed)
Bed: GP49 Expected date:  Expected time:  Means of arrival:  Comments: EMS syncope HR 140 seen at [facility] this am

## 2017-06-11 NOTE — Consult Note (Signed)
[facility] Nurse wound consult note Reason for Consult:MASD Wound type:partial thickness, IAD Pressure Injury POA: NA Measurement: 8cm circle of IAD with 7 small 0.5cm circles - 1cm circles of partial thickness wounds.  Wound bed: pink Drainage (amount, consistency, odor) none Periwound: excoriated from urine in between buttocks Dressing procedure/placement/frequency: I have provided nurses with orders for Cleansing with peri spray and using purple top Criticaid Barrier Cream.  Insructed husband in using this barrier twice a day once home.  I cleansed her and applied with husband observing.  Pt is incontinent, elderly husband is sole caregiver by his choice. Both refusing SNF. This is not a pressure wound but did caution husband of risk of pressure injury and demonstrated easy turning techniques. We will not follow, but will remain available to this patient, to nursing, and the medical and/or surgical teams.  Please re-consult if we need to assist further.   Fara Olden, RN-C, WTA-C, [facility] Wound Treatment Associate

## 2017-06-11 NOTE — ED Notes (Signed)
Call Claiborne Billings, RN with report at [phone]

## 2017-06-11 NOTE — Progress Notes (Signed)
Inpatient Diabetes Program Recommendations  AACE/ADA: New Consensus Statement on Inpatient Glycemic Control (2015)  Target Ranges:  Prepandial:   less than 140 mg/dL      Peak postprandial:   less than 180 mg/dL (1-2 hours)      Critically ill patients:  140 - 180 mg/dL  Results for [name] [name], [name] [name] (MRN [id number]) as of 06/11/2017 12:46  Ref. Range 06/10/2017 04:49 06/10/2017 08:06 06/10/2017 12:10 06/10/2017 16:06 06/10/2017 21:03 06/11/2017 07:22 06/11/2017 11:50  Glucose-Capillary Latest Ref Range: 65 - 99 mg/dL 104 (H) 149 (H) 204 (H) 192 (H) 335 (H) 270 (H) 278 (H)    Review of Glycemic Control  Diabetes history: DM2 Outpatient Diabetes medications: Levemir 8 units QHS, Novolog 4-9 units TID Current orders for Inpatient glycemic control: Lantus 5 units QH, Novolog 0-9 units TID with meals, Novolog 0-5 units QHS  Inpatient Diabetes Program Recommendations: Insulin - Basal: Please increase Lantus to 10 units QHS. Insulin-Meal Coverage: Please consider ordering Novolog 3 units TID with meals for meal coverage if patient eats at least 50% of meals.  Thanks, Barnie Alderman, RN, MSN, CDE Diabetes Coordinator Inpatient Diabetes Program [phone] (Team Pager from 8am to 5pm)

## 2017-06-11 NOTE — Progress Notes (Signed)
Patient discharged to home with spouse. IV removed. CM saw patient for home needs. Discharge instructions, appointments, and prescriptions reviewed with spouse. All belongings with patient. Patient left unit in stable condition via wheelchair.  Sheliah Plane RN

## 2017-06-11 NOTE — Progress Notes (Signed)
Physical Therapy Treatment Patient Details Name: [name] [name] MRN: [id number] DOB: 1941/06/19 Today's Date: 06/11/2017    History of Present Illness [name] [name] is a 76 y.o. female with medical history significant for chronic kidney disease stage III, insulin-dependent diabetes mellitus, hypertension, GERD, and history of stroke with motor and cognitive deficits, now presenting to the emergency department with chills and confusion. She reportedly been in her usual state until earlier this evening when, after complaining of chills, she was noted to be confused, having difficulty with basic questioning. Admitted to the medical-surgical unit for ongoing evaluation and management of SIRS with acute encephalopathy and AKI.     PT Comments    Pt is up to chair with significant assistance and positioning due to width of chair to maintain her stability.  Husband there to observe the degree of assistance PT is giving her but is fearful of previous bad experience with SNF and will not consider this.  Will continue acutely with PT to progress her strength and tolerance for standing and mobility.   Follow Up Recommendations  SNF     Equipment Recommendations  Other (comment);Wheelchair (measurements PT);Wheelchair cushion (measurements PT)    Recommendations for Other Services Other (comment) (appt with neurologist previously suggested)     Precautions / Restrictions Precautions Precautions: Fall Restrictions Weight Bearing Restrictions: No    Mobility  Bed Mobility Overal bed mobility: Needs Assistance Bed Mobility: Supine to Sit;Rolling Rolling: Min assist   Supine to sit: Mod assist     General bed mobility comments: needs assist to move legs, assisted bed pad to finish scooting out and then EOB  Transfers Overall transfer level: Needs assistance Equipment used: 1 person hand held assist Transfers: Sit to/from [employer] Transfers Sit to Stand: Mod assist Stand pivot  transfers: Mod assist;Max assist       General transfer comment: pt was not able to use walker, too weak to get to chair without direct help  Ambulation/Gait             General Gait Details: unable   Stairs            Wheelchair Mobility    Modified Rankin (Stroke Patients Only)       Balance Overall balance assessment: Needs assistance Sitting-balance support: Feet supported Sitting balance-Leahy Scale: Fair   Postural control: Posterior lean Standing balance support: Bilateral upper extremity supported Standing balance-Leahy Scale: Poor                              Cognition Arousal/Alertness: Awake/alert Behavior During Therapy: Flat affect Overall Cognitive Status: Impaired/Different from baseline Area of Impairment: Attention;Following commands;Safety/judgement;Awareness;Problem solving                   Current Attention Level: Selective   Following Commands: Follows one step commands with increased time Safety/Judgement: Decreased awareness of deficits;Decreased awareness of safety Awareness: Intellectual Problem Solving: Slow processing;Requires verbal cues;Requires tactile cues        Exercises      General Comments General comments (skin integrity, edema, etc.): husband is not willing to consider SNF but agrees he will have difficulty with pt      Pertinent Vitals/Pain Pain Assessment: Faces Faces Pain Scale: Hurts little more Pain Location: Bil LE and L shoulder with movement Pain Descriptors / Indicators: Discomfort Pain Intervention(s): Monitored during session;Premedicated before session;Repositioned    Home Living  Prior Function            PT Goals (current goals can now be found in the care plan section) Progress towards PT goals: Progressing toward goals    Frequency    Min 3X/week      PT Plan Current plan remains appropriate    Co-evaluation               AM-PAC PT "6 Clicks" Daily Activity  Outcome Measure  Difficulty turning over in bed (including adjusting bedclothes, sheets and blankets)?: Total Difficulty moving from lying on back to sitting on the side of the bed? : Total Difficulty sitting down on and standing up from a chair with arms (e.g., wheelchair, bedside commode, etc,.)?: Total Help needed moving to and from a bed to chair (including a wheelchair)?: A Lot Help needed walking in hospital room?: Total Help needed climbing 3-5 steps with a railing? : Total 6 Click Score: 7    End of Session Equipment Utilized During Treatment: Gait belt Activity Tolerance: Patient limited by fatigue;Patient limited by lethargy Patient left: in chair;with call bell/phone within reach;with chair alarm set;with family/visitor present Nurse Communication: Need for lift equipment;Mobility status PT Visit Diagnosis: Unsteadiness on feet (R26.81);Muscle weakness (generalized) (M62.81);Other abnormalities of gait and mobility (R26.89);Dizziness and giddiness (R42)     Time: 6606-0045 PT Time Calculation (min) (ACUTE ONLY): 24 min  Charges:  $Therapeutic Activity: 23-37 mins                    G Codes:  Functional Assessment Tool Used: AM-PAC 6 Clicks Basic Mobility     Ramond Dial 06/11/2017, 3:10 PM   Mee Hives, PT MS Acute Rehab Dept. Number: [place] [facility] [place]

## 2017-06-11 NOTE — ED Triage Notes (Signed)
Pt arrived via ems from home with c/o syncope. Husband called 911 because patient sat down went pale and then passed out. Per ems fire dept. Arrived first and thought that maybe she vagal because bp was 84/52. Once A&O and aroused her bp increased to 114/52. Pt lives only with husband whom is weak and fragile himself. Per ems patient has hx of dementia however A&O to baseline now. Neuro intact and denies pain or discomfort. VS 152/70, 116HR, 18resp, 98%RA, NSR/ST with BBB on monitor, 98.3 oral. Patient was just discharged today from Sepsis. [place] and has received 269ml NS [id number]

## 2017-06-12 ENCOUNTER — Other Ambulatory Visit: Payer: Medicare Other

## 2017-06-12 DIAGNOSIS — R7881 Bacteremia: Secondary | ICD-10-CM | POA: Diagnosis not present

## 2017-06-12 DIAGNOSIS — Z79899 Other long term (current) drug therapy: Secondary | ICD-10-CM | POA: Diagnosis not present

## 2017-06-12 DIAGNOSIS — E43 Unspecified severe protein-calorie malnutrition: Secondary | ICD-10-CM | POA: Diagnosis present

## 2017-06-12 DIAGNOSIS — K219 Gastro-esophageal reflux disease without esophagitis: Secondary | ICD-10-CM | POA: Diagnosis present

## 2017-06-12 DIAGNOSIS — Z833 Family history of diabetes mellitus: Secondary | ICD-10-CM | POA: Diagnosis not present

## 2017-06-12 DIAGNOSIS — I129 Hypertensive chronic kidney disease with stage 1 through stage 4 chronic kidney disease, or unspecified chronic kidney disease: Secondary | ICD-10-CM | POA: Diagnosis present

## 2017-06-12 DIAGNOSIS — J189 Pneumonia, unspecified organism: Secondary | ICD-10-CM | POA: Diagnosis present

## 2017-06-12 DIAGNOSIS — Z515 Encounter for palliative care: Secondary | ICD-10-CM | POA: Diagnosis present

## 2017-06-12 DIAGNOSIS — Z794 Long term (current) use of insulin: Secondary | ICD-10-CM | POA: Diagnosis not present

## 2017-06-12 DIAGNOSIS — Z7982 Long term (current) use of aspirin: Secondary | ICD-10-CM | POA: Diagnosis not present

## 2017-06-12 DIAGNOSIS — N183 Chronic kidney disease, stage 3 (moderate): Secondary | ICD-10-CM | POA: Diagnosis present

## 2017-06-12 DIAGNOSIS — E876 Hypokalemia: Secondary | ICD-10-CM | POA: Diagnosis present

## 2017-06-12 DIAGNOSIS — L8990 Pressure ulcer of unspecified site, unspecified stage: Secondary | ICD-10-CM | POA: Diagnosis present

## 2017-06-12 DIAGNOSIS — G40209 Localization-related (focal) (partial) symptomatic epilepsy and epileptic syndromes with complex partial seizures, not intractable, without status epilepticus: Secondary | ICD-10-CM | POA: Diagnosis present

## 2017-06-12 DIAGNOSIS — D649 Anemia, unspecified: Secondary | ICD-10-CM | POA: Diagnosis present

## 2017-06-12 DIAGNOSIS — Z91040 Latex allergy status: Secondary | ICD-10-CM | POA: Diagnosis not present

## 2017-06-12 DIAGNOSIS — E119 Type 2 diabetes mellitus without complications: Secondary | ICD-10-CM | POA: Diagnosis not present

## 2017-06-12 DIAGNOSIS — I6930 Unspecified sequelae of cerebral infarction: Secondary | ICD-10-CM | POA: Diagnosis not present

## 2017-06-12 DIAGNOSIS — J181 Lobar pneumonia, unspecified organism: Secondary | ICD-10-CM | POA: Diagnosis not present

## 2017-06-12 DIAGNOSIS — I69319 Unspecified symptoms and signs involving cognitive functions following cerebral infarction: Secondary | ICD-10-CM | POA: Diagnosis not present

## 2017-06-12 DIAGNOSIS — R531 Weakness: Secondary | ICD-10-CM | POA: Diagnosis present

## 2017-06-12 DIAGNOSIS — Z7189 Other specified counseling: Secondary | ICD-10-CM | POA: Diagnosis not present

## 2017-06-12 DIAGNOSIS — E1122 Type 2 diabetes mellitus with diabetic chronic kidney disease: Secondary | ICD-10-CM | POA: Diagnosis present

## 2017-06-12 DIAGNOSIS — Z888 Allergy status to other drugs, medicaments and biological substances status: Secondary | ICD-10-CM | POA: Diagnosis not present

## 2017-06-12 DIAGNOSIS — A4151 Sepsis due to Escherichia coli [E. coli]: Secondary | ICD-10-CM | POA: Diagnosis present

## 2017-06-12 DIAGNOSIS — I10 Essential (primary) hypertension: Secondary | ICD-10-CM | POA: Diagnosis not present

## 2017-06-12 DIAGNOSIS — E785 Hyperlipidemia, unspecified: Secondary | ICD-10-CM | POA: Diagnosis present

## 2017-06-12 DIAGNOSIS — E870 Hyperosmolality and hypernatremia: Secondary | ICD-10-CM | POA: Diagnosis not present

## 2017-06-12 LAB — GLUCOSE, CAPILLARY
GLUCOSE-CAPILLARY: 143 mg/dL — AB (ref 65–99)
Glucose-Capillary: 291 mg/dL — ABNORMAL HIGH (ref 65–99)
Glucose-Capillary: 228 mg/dL — ABNORMAL HIGH (ref 65–99)
Glucose-Capillary: 298 mg/dL — ABNORMAL HIGH (ref 65–99)
Glucose-Capillary: 189 mg/dL — ABNORMAL HIGH (ref 65–99)

## 2017-06-12 LAB — BASIC METABOLIC PANEL
Anion gap: 13 (ref 5–15)
BUN: 32 mg/dL — ABNORMAL HIGH (ref 6–20)
CO2: 27 mmol/L (ref 22–32)
Calcium: 8.6 mg/dL — ABNORMAL LOW (ref 8.9–10.3)
Chloride: 103 mmol/L (ref 101–111)
Creatinine, Ser: 1.41 mg/dL — ABNORMAL HIGH (ref 0.44–1.00)
GFR calc Af Amer: 41 mL/min — ABNORMAL LOW (ref >60)
GFR calc non Af Amer: 35 mL/min — ABNORMAL LOW (ref >60)
Glucose, Bld: 244 mg/dL — ABNORMAL HIGH (ref 65–99)
Potassium: 3.4 mmol/L — ABNORMAL LOW (ref 3.5–5.1)
Sodium: 143 mmol/L (ref 135–145)

## 2017-06-12 LAB — CBC
HEMATOCRIT: 31.7 % — AB (ref 36.0–46.0)
Hemoglobin: 10.3 g/dL — ABNORMAL LOW (ref 12.0–15.0)
MCH: 32.5 pg (ref 26.0–34.0)
MCHC: 32.5 g/dL (ref 30.0–36.0)
MCV: 100 fL (ref 78.0–100.0)
PLATELETS: 400 10*3/uL (ref 150–400)
RBC: 3.17 MIL/uL — ABNORMAL LOW (ref 3.87–5.11)
RDW: 13.7 % (ref 11.5–15.5)
WBC: 17 10*3/uL — AB (ref 4.0–10.5)

## 2017-06-12 LAB — CG4 I-STAT (LACTIC ACID): Lactic Acid, Venous: 1.43 mmol/L (ref 0.5–1.9)

## 2017-06-12 LAB — VALPROIC ACID LEVEL: Valproic Acid Lvl: 27 ug/mL — ABNORMAL LOW (ref 50.0–100.0)

## 2017-06-12 MED ORDER — HYDROXYZINE HCL 25 MG PO TABS: 25.0000 mg | ORAL_TABLET | Freq: Every evening | ORAL | Status: DC | PRN

## 2017-06-12 MED ORDER — PANTOPRAZOLE SODIUM 40 MG PO TBEC
40.0000 mg | DELAYED_RELEASE_TABLET | Freq: Every day | ORAL | Status: DC
  Administered 2017-06-12 – 2017-06-15 (×4): 40 mg via ORAL
  Filled 2017-06-12 (×4): qty 1

## 2017-06-12 MED ORDER — DOCUSATE SODIUM 100 MG PO CAPS
ORAL_CAPSULE | ORAL | Status: AC
  Administered 2017-06-12: 02:00:00
  Filled 2017-06-12: qty 1

## 2017-06-12 MED ORDER — POLYETHYLENE GLYCOL 3350 17 G PO PACK
17.0000 g | PACK | Freq: Every day | ORAL | Status: DC | PRN
  Administered 2017-06-14: 17 g via ORAL
  Filled 2017-06-12: qty 1

## 2017-06-12 MED ORDER — ONDANSETRON HCL 4 MG PO TABS: 4.0000 mg | ORAL_TABLET | Freq: Four times a day (QID) | ORAL | Status: DC | PRN

## 2017-06-12 MED ORDER — DOCUSATE SODIUM 100 MG PO CAPS
100.0000 mg | ORAL_CAPSULE | Freq: Two times a day (BID) | ORAL | Status: DC
  Administered 2017-06-12 – 2017-06-15 (×7): 100 mg via ORAL
  Filled 2017-06-12 (×7): qty 1

## 2017-06-12 MED ORDER — DILTIAZEM HCL 60 MG PO TABS
ORAL_TABLET | ORAL | Status: AC
  Administered 2017-06-12: 02:00:00
  Filled 2017-06-12: qty 1

## 2017-06-12 MED ORDER — POTASSIUM CHLORIDE CRYS ER 20 MEQ PO TBCR
40.0000 meq | EXTENDED_RELEASE_TABLET | Freq: Once | ORAL | Status: AC
  Administered 2017-06-12: 40 meq via ORAL
  Filled 2017-06-12: qty 2

## 2017-06-12 MED ORDER — METOPROLOL SUCCINATE ER 25 MG PO TB24
25.0000 mg | ORAL_TABLET | Freq: Every day | ORAL | Status: DC
  Administered 2017-06-12 – 2017-06-15 (×4): 25 mg via ORAL
  Filled 2017-06-12 (×4): qty 1

## 2017-06-12 MED ORDER — ENOXAPARIN SODIUM 30 MG/0.3ML ~~LOC~~ SOLN
30.0000 mg | SUBCUTANEOUS | Status: DC
  Administered 2017-06-12 – 2017-06-15 (×4): 30 mg via SUBCUTANEOUS
  Filled 2017-06-12 (×4): qty 0.3

## 2017-06-12 MED ORDER — ONDANSETRON HCL 4 MG/2ML IJ SOLN: 4.0000 mg | Freq: Four times a day (QID) | INTRAMUSCULAR | Status: DC | PRN

## 2017-06-12 MED ORDER — ACETAMINOPHEN 650 MG RE SUPP: 650.0000 mg | Freq: Four times a day (QID) | RECTAL | Status: DC | PRN

## 2017-06-12 MED ORDER — MAGNESIUM GLUCONATE 500 MG PO TABS
500.0000 mg | ORAL_TABLET | Freq: Two times a day (BID) | ORAL | Status: DC
  Administered 2017-06-12 – 2017-06-15 (×7): 500 mg via ORAL
  Filled 2017-06-12 (×8): qty 1

## 2017-06-12 MED ORDER — SODIUM CHLORIDE 0.9 % IV SOLN
INTRAVENOUS | Status: DC
  Administered 2017-06-12: 02:00:00 via INTRAVENOUS

## 2017-06-12 MED ORDER — CEPHALEXIN 500 MG PO CAPS
ORAL_CAPSULE | ORAL | Status: AC
  Administered 2017-06-12: 500 mg
  Filled 2017-06-12: qty 1

## 2017-06-12 MED ORDER — ADULT MULTIVITAMIN W/MINERALS CH
1.0000 | ORAL_TABLET | Freq: Every day | ORAL | Status: DC
  Administered 2017-06-12 – 2017-06-15 (×4): 1 via ORAL
  Filled 2017-06-12 (×4): qty 1

## 2017-06-12 MED ORDER — DIVALPROEX SODIUM ER 500 MG PO TB24
750.0000 mg | ORAL_TABLET | Freq: Every day | ORAL | Status: DC
  Administered 2017-06-12 – 2017-06-15 (×4): 750 mg via ORAL
  Filled 2017-06-12 (×4): qty 1

## 2017-06-12 MED ORDER — DILTIAZEM HCL 60 MG PO TABS
60.0000 mg | ORAL_TABLET | Freq: Two times a day (BID) | ORAL | Status: DC
  Administered 2017-06-12 – 2017-06-15 (×7): 60 mg via ORAL
  Filled 2017-06-12 (×7): qty 1

## 2017-06-12 MED ORDER — ACETAMINOPHEN 325 MG PO TABS
650.0000 mg | ORAL_TABLET | Freq: Four times a day (QID) | ORAL | Status: DC | PRN
  Administered 2017-06-13 – 2017-06-14 (×3): 650 mg via ORAL
  Filled 2017-06-12 (×3): qty 2

## 2017-06-12 MED ORDER — SODIUM CHLORIDE 0.9% FLUSH
3.0000 mL | Freq: Two times a day (BID) | INTRAVENOUS | Status: DC
  Administered 2017-06-12 – 2017-06-14 (×3): 3 mL via INTRAVENOUS

## 2017-06-12 MED ORDER — VITAMIN C 500 MG PO TABS
500.0000 mg | ORAL_TABLET | Freq: Every day | ORAL | Status: DC
  Administered 2017-06-12 – 2017-06-15 (×4): 500 mg via ORAL
  Filled 2017-06-12 (×4): qty 1

## 2017-06-12 MED ORDER — ENSURE ENLIVE PO LIQD
237.0000 mL | Freq: Two times a day (BID) | ORAL | Status: DC
  Administered 2017-06-12 – 2017-06-13 (×4): 237 mL via ORAL

## 2017-06-12 MED ORDER — ATORVASTATIN CALCIUM 10 MG PO TABS
10.0000 mg | ORAL_TABLET | Freq: Every day | ORAL | Status: DC
  Administered 2017-06-12 – 2017-06-14 (×3): 10 mg via ORAL
  Filled 2017-06-12 (×3): qty 1

## 2017-06-12 MED ORDER — INSULIN DETEMIR 100 UNIT/ML ~~LOC~~ SOLN
8.0000 [IU] | Freq: Every day | SUBCUTANEOUS | Status: DC
  Administered 2017-06-12 – 2017-06-14 (×3): 8 [IU] via SUBCUTANEOUS
  Filled 2017-06-12 (×4): qty 0.08

## 2017-06-12 MED ORDER — CEPHALEXIN 500 MG PO CAPS
500.0000 mg | ORAL_CAPSULE | Freq: Two times a day (BID) | ORAL | Status: DC
  Administered 2017-06-12 (×2): 500 mg via ORAL
  Filled 2017-06-12 (×2): qty 1

## 2017-06-12 MED ORDER — ASPIRIN EC 81 MG PO TBEC
81.0000 mg | DELAYED_RELEASE_TABLET | Freq: Every day | ORAL | Status: DC
  Administered 2017-06-12 – 2017-06-15 (×4): 81 mg via ORAL
  Filled 2017-06-12 (×4): qty 1

## 2017-06-12 MED ORDER — SODIUM CHLORIDE 0.9 % IV SOLN
INTRAVENOUS | Status: DC
  Administered 2017-06-12 – 2017-06-13 (×2): via INTRAVENOUS

## 2017-06-12 NOTE — Care Management Obs Status (Signed)
[place] NOTIFICATION   Patient Details  Name: [name] [name] MRN: [id number] Date of Birth: Aug 01, 1941   Medicare Observation Status Notification Given:  Yes    MahabirJuliann Pulse, RN 06/12/2017, 11:48 AM

## 2017-06-12 NOTE — ED Notes (Signed)
ED TO INPATIENT HANDOFF REPORT  Name/Age/Gender [name] [name] 76 y.o. female  Home/SNF/Other Home  Chief Complaint syncope, sepsis  Code Status History    Date Active Date Inactive Code Status Order ID Comments User Context   06/06/2017 12:02 AM 06/11/2017  6:57 PM Full Code [id number]  Vianne Bulls, MD ED   11/15/2016  5:53 PM 11/21/2016  8:34 PM Full Code [id number]  Burgess Estelle, MD Inpatient   08/27/2012  4:19 PM 09/10/2012  2:46 PM Full Code [id number]  Elliot Cousin, RN Inpatient   08/18/2012 11:48 PM 08/27/2012  4:19 PM Full Code [id number]  Hardie Shackleton, RN Inpatient   07/26/2012  5:29 AM 07/28/2012  7:11 PM Full Code [id number]  Faucette, Josephine Cables, RN Inpatient      Level of Care/Admitting Diagnosis ED Disposition    ED Disposition Condition Comment   Admit  Hospital Area: [facility] [[id number]]  Level of Care: Telemetry [5]  Admit to tele based on following criteria: Complex arrhythmia (Bradycardia/Tachycardia)  Diagnosis: Sepsis [facility]) [[id number]]  Admitting Physician: Karmen Bongo [[id number]]  Attending Physician: Karmen Bongo [[id number]]  PT Class (Do Not Modify): Observation [104]  PT Acc Code (Do Not Modify): Observation [10022]       Medical History Past Medical History:  Diagnosis Date  . Anemia   . Arthritis    "a touch in my hands" (11/17/2016)  . Chronic back pain   . Chronic kidney disease (CKD), active medical management without dialysis, stage 3 (moderate)   . Depression   . GERD (gastroesophageal reflux disease)   . High cholesterol   . Hyperlipidemia   . Hypertension   . Seizures ([place])    last seizure 2014 (11/17/2016)  . Stroke [facility]) 2013   Secondary to acute right posterior temporo-occipital intra-axial hemorrhage  . Type II diabetes mellitus (HCC)     Allergies Allergies  Allergen Reactions  . Barbiturates     Becomes restless.  Also with all "strong" medications like sleeping pills  . Latex Itching    IV  Location/Drains/Wounds Patient Lines/Drains/Airways Status   Active Line/Drains/Airways    Name:   Placement date:   Placement time:   Site:   Days:   Peripheral IV 06/05/17 Left Hand  06/05/17    2212    Hand    7   Peripheral IV 06/11/17 Left Hand  06/11/17    1918    Hand    1   Peripheral IV 06/11/17 Right Hand  06/11/17    2014    Hand    1   Wound 08/27/12 Other (Comment)  08/27/12    1759        1750          Labs/Imaging Results for orders placed or performed during the hospital encounter of 06/11/17 (from the past 48 hour(s))  CBC with Differential     Status: Abnormal   Collection Time: 06/11/17  7:48 PM  Result Value Ref Range   WBC 18.8 (H) 4.0 - 10.5 K/uL   RBC 2.95 (L) 3.87 - 5.11 MIL/uL   Hemoglobin 9.8 (L) 12.0 - 15.0 g/dL   HCT 29.3 (L) 36.0 - 46.0 %   MCV 99.3 78.0 - 100.0 fL   MCH 33.2 26.0 - 34.0 pg   MCHC 33.4 30.0 - 36.0 g/dL   RDW 13.7 11.5 - 15.5 %   Platelets 312 150 - 400 K/uL   Neutrophils Relative % 74 %   Neutro Abs 13.9 (  H) 1.7 - 7.7 K/uL   Lymphocytes Relative 10 %   Lymphs Abs 1.9 0.7 - 4.0 K/uL   Monocytes Relative 15 %   Monocytes Absolute 2.9 (H) 0.1 - 1.0 K/uL   Eosinophils Relative 1 %   Eosinophils Absolute 0.1 0.0 - 0.7 K/uL   Basophils Relative 0 %   Basophils Absolute 0.0 0.0 - 0.1 K/uL  Comprehensive metabolic panel     Status: Abnormal   Collection Time: 06/11/17  7:48 PM  Result Value Ref Range   Sodium 140 135 - 145 mmol/L   Potassium 4.0 3.5 - 5.1 mmol/L   Chloride 101 101 - 111 mmol/L   CO2 30 22 - 32 mmol/L   Glucose, Bld 342 (H) 65 - 99 mg/dL   BUN 38 (H) 6 - 20 mg/dL   Creatinine, Ser 1.84 (H) 0.44 - 1.00 mg/dL   Calcium 8.6 (L) 8.9 - 10.3 mg/dL   Total Protein 6.3 (L) 6.5 - 8.1 g/dL   Albumin 2.2 (L) 3.5 - 5.0 g/dL   AST 27 15 - 41 U/L   ALT 19 14 - 54 U/L   Alkaline Phosphatase 98 38 - 126 U/L   Total Bilirubin 0.6 0.3 - 1.2 mg/dL   GFR calc non Af Amer 26 (L) >60 mL/min   GFR calc Af Amer 30 (L) >60 mL/min     Comment: (NOTE) The eGFR has been calculated using the CKD EPI equation. This calculation has not been validated in all clinical situations. eGFR's persistently <60 mL/min signify possible Chronic Kidney Disease.    Anion gap 9 5 - 15  I-Stat Troponin, ED (not at [facility]     Status: None   Collection Time: 06/11/17  8:00 PM  Result Value Ref Range   Troponin i, poc 0.01 0.00 - 0.08 ng/mL   Comment 3            Comment: Due to the release kinetics of cTnI, a negative result within the first hours of the onset of symptoms does not rule out myocardial infarction with certainty. If myocardial infarction is still suspected, repeat the test at appropriate intervals.   I-Stat CG4 Lactic Acid, ED     Status: None   Collection Time: 06/11/17  8:03 PM  Result Value Ref Range   Lactic Acid, Venous 1.69 0.5 - 1.9 mmol/L  Urinalysis, Routine w reflex microscopic     Status: Abnormal   Collection Time: 06/11/17 10:15 PM  Result Value Ref Range   Color, Urine YELLOW YELLOW   APPearance CLEAR CLEAR   Specific Gravity, Urine 1.011 1.005 - 1.030   pH 6.0 5.0 - 8.0   Glucose, UA >=500 (A) NEGATIVE mg/dL   Hgb urine dipstick MODERATE (A) NEGATIVE   Bilirubin Urine NEGATIVE NEGATIVE   Ketones, ur 5 (A) NEGATIVE mg/dL   Protein, ur 30 (A) NEGATIVE mg/dL   Nitrite NEGATIVE NEGATIVE   Leukocytes, UA LARGE (A) NEGATIVE   RBC / HPF 6-30 0 - 5 RBC/hpf   WBC, UA 6-30 0 - 5 WBC/hpf   Bacteria, UA RARE (A) NONE SEEN   Squamous Epithelial / LPF NONE SEEN NONE SEEN   Mucous PRESENT    Dg Chest 1 View  Result Date: 06/11/2017 CLINICAL DATA:  Syncope. EXAM: CHEST 1 VIEW COMPARISON:  06/05/2017 and prior chest radiographs FINDINGS: The cardiomediastinal silhouette is unremarkable. There is no evidence of focal airspace disease, pulmonary edema, suspicious pulmonary nodule/mass, pleural effusion, or pneumothorax. No acute bony abnormalities are  identified. IMPRESSION: No active disease. Electronically Signed    By: Margarette Canada M.D.   On: 06/11/2017 20:32   Ct Head Wo Contrast  Result Date: 06/11/2017 CLINICAL DATA:  Syncope. EXAM: CT HEAD WITHOUT CONTRAST TECHNIQUE: Contiguous axial images were obtained from the base of the skull through the vertex without intravenous contrast. COMPARISON:  11/15/2016 FINDINGS: Brain: There is no evidence for acute hemorrhage, hydrocephalus, mass lesion, or abnormal extra-axial fluid collection. No definite CT evidence for acute infarction. Diffuse loss of parenchymal volume is consistent with atrophy. Patchy low attenuation in the deep hemispheric and periventricular white matter is nonspecific, but likely reflects chronic microvascular ischemic demyelination. Vascular: No hyperdense vessel or unexpected calcification. Skull: No evidence for fracture. No worrisome lytic or sclerotic lesion. Sinuses/Orbits: The visualized paranasal sinuses and mastoid air cells are clear. Visualized portions of the globes and intraorbital fat are unremarkable. Other: As noted on prior studies, there is a soft tissue nodule measuring up to about 18 mm, incompletely visualize, in the posterior left nose, superficial to the medial aspect of the left maxillary sinus. IMPRESSION: 1. Stable.  No acute intracranial abnormality. 2. Atrophy with chronic small vessel white matter ischemic disease. Electronically Signed   By: Misty Stanley M.D.   On: 06/11/2017 20:25    Pending Labs [employer]    Start     Ordered   06/11/17 2323  Valproic acid level  Once,   R     06/11/17 2323   06/11/17 1928  Blood culture (routine x 2)  BLOOD CULTURE X 2,   STAT    Question:  Patient immune status  Answer:  Normal   06/11/17 1927   06/11/17 1928  Urine culture  STAT,   STAT    Question:  Patient immune status  Answer:  Normal   06/11/17 1927   Signed and Held  Basic metabolic panel  Tomorrow morning,   R     Signed and Held   Signed and Held  CBC  Tomorrow morning,   R     Signed and Held   Signed and  Held  Creatinine, serum  (enoxaparin (LOVENOX)    CrCl < 30 ml/min)  Weekly,   R    Comments:  while on enoxaparin therapy.    Signed and Held      Isolation Precautions No active isolations  Vitals/Pain Today's Vitals   06/11/17 1921 06/11/17 1930 06/11/17 2200 06/12/17 0042  BP:  (!) 135/49 135/71 140/70  Pulse:  (!) 112 (!) 116 (!) 117  Resp:  '20 20 18  ' Temp:   98.2 F (36.8 C)   TempSrc:   Oral   SpO2: 98% (!) 89% 94% 90%  Weight: 151 lb (68.5 kg)     Height: '5\' 5"'  (1.651 m)     PainSc: 0-No pain       Medications Medications  insulin aspart (novoLOG) injection 0-15 Units (not administered)  insulin aspart (novoLOG) injection 0-5 Units (not administered)  sodium chloride 0.9 % bolus 1,000 mL (0 mLs Intravenous Stopped 06/12/17 0053)  sodium chloride 0.9 % bolus 1,000 mL (0 mLs Intravenous Stopped 06/12/17 0053)  cefTRIAXone (ROCEPHIN) 2 g in dextrose 5 % 50 mL IVPB (0 g Intravenous Stopped 06/11/17 2217)    Mobility non-ambulatory

## 2017-06-12 NOTE — Care Management Note (Signed)
Case Management Note  Patient Details  Name: [name] [name] MRN: [id number] Date of Birth: 07/14/1941  Subjective/Objective:  76 y/o f fadmitted w/Weakness. From home. Just d/c from [facility]-spouse declined SNF,agreed to [facility] chosen. Spoke to spouse in rm about d/c plans-he would prefer SNF-[facility]. PT/TO cons -await recc. Confirmed w/billing [employer] primary insurance, medicare is secondary.CSW notified, & following for SNF.                  Action/Plan:d/c plan SNF   Expected Discharge Date:   (unknown)               Expected Discharge Plan:  [place]  In-House Referral:  Clinical Social Work  Discharge planning Services  CM Consult  Post Acute Care Choice:  Home Health ([facility]-H[facility]/PT/OT) Choice offered to:     DME Arranged:    DME Agency:     HH Arranged:    [place] Agency:     Status of Service:  In process, will continue to follow  If discussed at Long Length of Stay Meetings, dates discussed:    Additional Comments:  Dessa Phi, RN 06/12/2017, 11:45 AM

## 2017-06-12 NOTE — Progress Notes (Signed)
PROGRESS NOTE                                                                                                                                                                                                             Patient Demographics:    [name] [name], is a 76 y.o. female, DOB - 11/19/1940, OEU:[id number]  Admit date - 06/11/2017   Admitting Physician Karmen Bongo, MD  Outpatient Primary MD for the patient is [name], MD  LOS - 0   Chief Complaint  Patient presents with  . Loss of Consciousness       Brief Narrative   y.o. 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   Subjective:    [name] [name] today Lipping comfortably, denies any complaints, no significant events overnight per nursing staff.    Assessment  & Plan :    Principal Problem:   Weakness Active Problems:   DM type 2 (diabetes mellitus, type 2) (HCC)   HTN (hypertension)   CKD (chronic kidney disease) stage 3, GFR 30-59 ml/min   Protein-calorie malnutrition, severe (HCC)   Bacteremia due to Escherichia coli   Partial symptomatic epilepsy with complex partial seizures, not intractable, without status epilepticus ([place])   Sepsis ([place])   Anemia   CAP (community acquired pneumonia)   Weakness - Secondary to recent prolonged hospital stay from sepsis, condition was for SNF discharge, but patient/family has declined, patient did not do well in route to home, readmitted again, PT will follow, recommendation for SNF placement, social worker involved.  Escherichia coli bacteremia - Patient with 1/2 positive blood cultures during prior hospitalization, pansensitive, she was discharged  on Keflex , will continue during hospital stay . - Of note, the patient also had E coli bacteremia in 1/18 and so ID consultation was suggested at the time of discharge today  CKD -Patient with baseline creatinine 1.6-1.7, was 1.8 on admission, today is 1.4 as she received fluid bolus in ED -She did have renal failure during prior hospitalization with creatinine peak at 2.19; renal US 8/4 with mild righthydronephrosis  HTN -Medication changes during hospitalization include reduction in Cardizem and transition from BID Lopressor to Toprol XL -Will follow  Anemia -Hgb stable from earlier today, but decreased during hospitalization -Will follow  DM -Recent A1c 7.3 -Continue Levemir and SSI  Malnutrition -Patient had SLP evaluation during hospitalization but does not appear to have had nutrition consult - will add  Seizures -Continue Depakote, follow on level     Code Status : Full  Family Communication  : None at bedside  Disposition Plan  : We'll need SNF placement   Consults  :  none  Procedures  : none  DVT Prophylaxis  :  Lovenox   Lab Results  Component Value Date   PLT 400 06/12/2017    Antibiotics  :    Anti-infectives    Start     Dose/Rate Route Frequency Ordered Stop   06/12/17 0156  cephALEXin (KEFLEX) 500 MG capsule    Comments:  Campos-Garcia, Kelly: cabinet override      06/12/17 0156 06/12/17 0216   06/12/17 0145  cephALEXin (KEFLEX) capsule 500 mg     500 mg Oral 2 times daily 06/12/17 0132     06/11/17 2100  cefTRIAXone (ROCEPHIN) 2 g in dextrose 5 % 50 mL IVPB     2 g 100 mL/hr over 30 Minutes Intravenous  Once 06/11/17 2050 06/11/17 2217        Objective:   Vitals:   06/12/17 0144 06/12/17 0609 06/12/17 1009 06/12/17 1317  BP: (!) 152/80 (!) 150/77 (!) 152/74 (!) 151/61  Pulse: (!) 116 100 98 99  Resp: 18 18  18   Temp: 98 F (36.7 C) 98.7 F (37.1 C)  98.5 F (36.9 C)  TempSrc: Oral Oral  Oral  SpO2: 94% 98%  96%    Weight: 66 kg (145 lb 9.6 oz)     Height:        Wt Readings from Last 3 Encounters:  06/12/17 66 kg (145 lb 9.6 oz)  06/10/17 68.5 kg (151 lb 0.2 oz)  11/21/16 66 kg (145 lb 8.1 oz)     Intake/Output Summary (Last 24 hours) at 06/12/17 1522 Last data filed at 06/12/17 1300  Gross per 24 hour  Intake           966.67 ml  Output              500 ml  Net           466.67 ml     Physical Exam  Frail elderly female, laying in bed in no apparent distress Symmetrical Chest wall movement, Good air movement bilaterally, CTAB RRR,No Gallops,Rubs or new Murmurs, No Parasternal Heave +ve B.Sounds, Abd Soft, No tenderness,  No rebound - guarding or rigidity. No Cyanosis, Clubbing or edema, No new Rash or bruise      Data Review:    CBC  Recent Labs Lab 06/05/17 2320 06/06/17 0139 06/07/17 0419 06/08/17 0500 06/11/17 0412 06/11/17 1948 06/12/17 0513  WBC  --  17.6* 17.6* 15.4* 17.1* 18.8* 17.0*  HGB  --  10.5* 11.3* 11.0* 9.9* 9.8* 10.3*  HCT  --  32.7* 36.4 34.2* 31.3* 29.3* 31.7*  PLT  --  162 195 217 320 312 400  MCV  --  100.6* 101.7* 98.8 101.0* 99.3 100.0  MCH  --  32.3 31.6 31.8 31.9 33.2 32.5  MCHC  --  32.1 31.0 32.2 31.6 33.4 32.5  RDW  --  12.9 13.3 13.1 13.9 13.7 13.7  LYMPHSABS 1.2 2.2  --   --   --  1.9  --   MONOABS 2.4* 3.3*  --   --   --  2.9*  --   EOSABS 0.0 0.0  --   --   --  0.1  --   BASOSABS 0.0 0.0  --   --   --  0.0  --     Chemistries   Recent Labs Lab 06/05/17 2234  06/08/17 0500 06/09/17 1014 06/11/17 0412 06/11/17 1948 06/12/17 0513  NA 136  < > 139 138 140 140 143  K 4.6  < > 3.8 4.0 3.9 4.0 3.4*  CL 97*  < > 102 101 98* 101 103  CO2 27  < > 28 26 30 30 27   GLUCOSE 167*  < > 178* 249* 299* 342* 244*  BUN 41*  < > 35* 31* 30* 38* 32*  CREATININE 2.10*  < > 1.93* 1.69* 1.64* 1.84* 1.41*  CALCIUM 9.0  < > 8.8* 8.5* 8.8* 8.6* 8.6*  AST 27  --   --   --   --  27  --   ALT 13*  --   --   --   --  19  --   ALKPHOS 116  --   --    --   --  98  --   BILITOT 0.7  --   --   --   --  0.6  --   < > = values in this interval not displayed. ------------------------------------------------------------------------------------------------------------------ No results for input(s): CHOL, HDL, LDLCALC, TRIG, CHOLHDL, LDLDIRECT in the last 72 hours.  Lab Results  Component Value Date   HGBA1C 7.6 (H) 06/07/2017   ------------------------------------------------------------------------------------------------------------------ No results for input(s): TSH, T4TOTAL, T3FREE, THYROIDAB in the last 72 hours.  Invalid input(s): FREET3 ------------------------------------------------------------------------------------------------------------------ No results for input(s): VITAMINB12, FOLATE, FERRITIN, TIBC, IRON, RETICCTPCT in the last 72 hours.  Coagulation profile  Recent Labs Lab 06/05/17 2234  INR 0.90    No results for input(s): DDIMER in the last 72 hours.  Cardiac Enzymes No results for input(s): CKMB, TROPONINI, MYOGLOBIN in the last 168 hours.  Invalid input(s): CK ------------------------------------------------------------------------------------------------------------------ No results found for: BNP  Inpatient Medications  Scheduled Meds: . aspirin EC  81 mg Oral Daily  . atorvastatin  10 mg Oral q1800  . cephALEXin  500 mg Oral BID  . diltiazem  60 mg Oral BID  . divalproex  750 mg Oral Daily  . docusate sodium  100 mg Oral BID  . enoxaparin (LOVENOX) injection  30 mg Subcutaneous Q24H  . feeding supplement (ENSURE ENLIVE)  237 mL Oral BID BM  . insulin aspart  0-15 Units Subcutaneous TID WC  . insulin aspart  0-5 Units Subcutaneous QHS  . insulin detemir  8 Units Subcutaneous Q2200  . magnesium gluconate  500 mg Oral BID  . metoprolol succinate  25 mg Oral Daily  . multivitamin with minerals  1 tablet Oral Daily  . pantoprazole  40 mg Oral Daily  . sodium chloride flush  3 mL Intravenous Q12H    . vitamin C  500 mg Oral Daily   Continuous Infusions: . sodium chloride 100 mL/hr at 06/12/17 0214   PRN Meds:.acetaminophen **OR** acetaminophen, hydrOXYzine, ondansetron **OR** ondansetron (ZOFRAN) IV, polyethylene glycol  Micro Results Recent Results (from the past 240 hour(s))  Culture, blood (Routine x 2)     Status: Abnormal   Collection Time: 06/05/17 10:35 PM  Result Value Ref  Range Status   Specimen Description BLOOD RIGHT ANTECUBITAL  Final   Special Requests   Final    BOTTLES DRAWN AEROBIC AND ANAEROBIC Blood Culture adequate volume   Culture  Setup Time   Final    GRAM NEGATIVE RODS IN BOTH AEROBIC AND ANAEROBIC BOTTLES CRITICAL RESULT CALLED TO, READ BACK BY AND VERIFIED WITH: R RUMBARGER,PHARMD AT 9833 06/06/17 BY L BENFIELD    Culture ESCHERICHIA COLI (A)  Final   Report Status 06/08/2017 FINAL  Final   Organism ID, Bacteria ESCHERICHIA COLI  Final      Susceptibility   Escherichia coli - MIC*    AMPICILLIN 4 SENSITIVE Sensitive     CEFAZOLIN <=4 SENSITIVE Sensitive     CEFEPIME <=1 SENSITIVE Sensitive     CEFTAZIDIME <=1 SENSITIVE Sensitive     CEFTRIAXONE <=1 SENSITIVE Sensitive     CIPROFLOXACIN <=0.25 SENSITIVE Sensitive     GENTAMICIN <=1 SENSITIVE Sensitive     IMIPENEM <=0.25 SENSITIVE Sensitive     TRIMETH/SULFA <=20 SENSITIVE Sensitive     AMPICILLIN/SULBACTAM <=2 SENSITIVE Sensitive     PIP/TAZO <=4 SENSITIVE Sensitive     Extended ESBL NEGATIVE Sensitive     * ESCHERICHIA COLI  Blood Culture ID Panel (Reflexed)     Status: Abnormal   Collection Time: 06/05/17 10:35 PM  Result Value Ref Range Status   Enterococcus species NOT DETECTED NOT DETECTED Final   Listeria monocytogenes NOT DETECTED NOT DETECTED Final   Staphylococcus species NOT DETECTED NOT DETECTED Final   Staphylococcus aureus NOT DETECTED NOT DETECTED Final   Streptococcus species NOT DETECTED NOT DETECTED Final   Streptococcus agalactiae NOT DETECTED NOT DETECTED Final    Streptococcus pneumoniae NOT DETECTED NOT DETECTED Final   Streptococcus pyogenes NOT DETECTED NOT DETECTED Final   Acinetobacter baumannii NOT DETECTED NOT DETECTED Final   Enterobacteriaceae species DETECTED (A) NOT DETECTED Final    Comment: Enterobacteriaceae represent a large family of gram-negative bacteria, not a single organism. CRITICAL RESULT CALLED TO, READ BACK BY AND VERIFIED WITH: R RUMBARGER,PHARMD AT 1149 06/06/17 BY L BENFIELD    Enterobacter cloacae complex NOT DETECTED NOT DETECTED Final   Escherichia coli DETECTED (A) NOT DETECTED Final    Comment: CRITICAL RESULT CALLED TO, READ BACK BY AND VERIFIED WITH: R RUMBARGER,PHARMD AT 1149 06/06/17 BY L BENFIELD    Klebsiella oxytoca NOT DETECTED NOT DETECTED Final   Klebsiella pneumoniae NOT DETECTED NOT DETECTED Final   Proteus species NOT DETECTED NOT DETECTED Final   Serratia marcescens NOT DETECTED NOT DETECTED Final   Carbapenem resistance NOT DETECTED NOT DETECTED Final   Haemophilus influenzae NOT DETECTED NOT DETECTED Final   Neisseria meningitidis NOT DETECTED NOT DETECTED Final   Pseudomonas aeruginosa NOT DETECTED NOT DETECTED Final   Candida albicans NOT DETECTED NOT DETECTED Final   Candida glabrata NOT DETECTED NOT DETECTED Final   Candida krusei NOT DETECTED NOT DETECTED Final   Candida parapsilosis NOT DETECTED NOT DETECTED Final   Candida tropicalis NOT DETECTED NOT DETECTED Final  Culture, blood (Routine x 2)     Status: None   Collection Time: 06/05/17 10:47 PM  Result Value Ref Range Status   Specimen Description BLOOD LEFT FOREARM  Final   Special Requests IN PEDIATRIC BOTTLE Blood Culture adequate volume  Final   Culture NO GROWTH 5 DAYS  Final   Report Status 06/10/2017 FINAL  Final  Urine culture     Status: Abnormal   Collection Time: 06/05/17 11:03 PM  Result Value Ref Range Status   Specimen Description URINE, CATHETERIZED  Final   Special Requests NONE  Final   Culture <10,000 COLONIES/mL  INSIGNIFICANT GROWTH (A)  Final   Report Status 06/07/2017 FINAL  Final  Blood culture (routine x 2)     Status: None (Preliminary result)   Collection Time: 06/11/17  8:09 PM  Result Value Ref Range Status   Specimen Description BLOOD RIGHT HAND  Final   Special Requests   Final    BOTTLES DRAWN AEROBIC AND ANAEROBIC Blood Culture adequate volume   Culture   Final    NO GROWTH < 24 HOURS Performed at [facility], [place] [address] [place], [place] [address]    Report Status PENDING  Incomplete  Blood culture (routine x 2)     Status: None (Preliminary result)   Collection Time: 06/11/17  8:10 PM  Result Value Ref Range Status   Specimen Description BLOOD RIGHT ANTECUBITAL  Final   Special Requests IN PEDIATRIC BOTTLE Blood Culture adequate volume  Final   Culture   Final    NO GROWTH < 24 HOURS Performed at [facility], [place] [address] [place], [place] [address]    Report Status PENDING  Incomplete    Radiology Reports Dg Chest 1 View  Result Date: 06/11/2017 CLINICAL DATA:  Syncope. EXAM: CHEST 1 VIEW COMPARISON:  06/05/2017 and prior chest radiographs FINDINGS: The cardiomediastinal silhouette is unremarkable. There is no evidence of focal airspace disease, pulmonary edema, suspicious pulmonary nodule/mass, pleural effusion, or pneumothorax. No acute bony abnormalities are identified. IMPRESSION: No active disease. Electronically Signed   By: Margarette Canada M.D.   On: 06/11/2017 20:32   Dg Chest 2 View  Result Date: 06/05/2017 CLINICAL DATA:  Fever, weakness for 2 days. History of stroke, hypertension, diabetes. EXAM: CHEST  2 VIEW COMPARISON:  Chest x-ray dated 11/15/2016. FINDINGS: Heart size and mediastinal contours are within normal limits. Atherosclerotic changes noted at the aortic arch. Lungs are clear. No pleural effusion or pneumothorax seen. No acute or suspicious osseous finding. Calcifications about both shoulders suggests chronic calcific tendinopathy.  IMPRESSION: No active cardiopulmonary disease. No evidence of pneumonia or pulmonary edema. Aortic atherosclerosis. Electronically Signed   By: Franki Cabot M.D.   On: 06/05/2017 23:39   Ct Head Wo Contrast  Result Date: 06/11/2017 CLINICAL DATA:  Syncope. EXAM: CT HEAD WITHOUT CONTRAST TECHNIQUE: Contiguous axial images were obtained from the base of the skull through the vertex without intravenous contrast. COMPARISON:  11/15/2016 FINDINGS: Brain: There is no evidence for acute hemorrhage, hydrocephalus, mass lesion, or abnormal extra-axial fluid collection. No definite CT evidence for acute infarction. Diffuse loss of parenchymal volume is consistent with atrophy. Patchy low attenuation in the deep hemispheric and periventricular white matter is nonspecific, but likely reflects chronic microvascular ischemic demyelination. Vascular: No hyperdense vessel or unexpected calcification. Skull: No evidence for fracture. No worrisome lytic or sclerotic lesion. Sinuses/Orbits: The visualized paranasal sinuses and mastoid air cells are clear. Visualized portions of the globes and intraorbital fat are unremarkable. Other: As noted on prior studies, there is a soft tissue nodule measuring up to about 18 mm, incompletely visualize, in the posterior left nose, superficial to the medial aspect of the left maxillary sinus. IMPRESSION: 1. Stable.  No acute intracranial abnormality. 2. Atrophy with chronic small vessel white matter ischemic disease. Electronically Signed   By: Misty Stanley M.D.   On: 06/11/2017 20:25   US Renal  Result Date: 06/06/2017  CLINICAL DATA:  76 year old female with acute renal insufficiency, altered mental status, and fever. EXAM: RENAL / URINARY TRACT ULTRASOUND COMPLETE COMPARISON:  Ultrasound dated 05/12/2017 FINDINGS: Right Kidney: Length: 8.7 cm. There is mild increased echogenicity and parenchymal thinning. There is mild right hydronephrosis. There is a 1.0 x 0.7 x 1.0 cm hypoechoic lesion  in the upper pole, likely a complex cyst, similar to prior ultrasound. Left Kidney: Length: 9.4 cm. Mild increased echogenicity. There is parenchymal atrophy and cortical thinning. There is a 4.2 x 3.2 x 2.7 cm inferior pole cyst. No hydronephrosis or echogenic stone. Bladder: Appears normal for degree of bladder distention. IMPRESSION: 1. Mild increased echogenicity and parenchymal atrophy. 2. Mild right hydronephrosis.  No obstructing stone. 3. Left renal cyst.  Probable right renal complex cyst. 4. Unremarkable urinary bladder. Electronically Signed   By: Anner Crete M.D.   On: 06/06/2017 01:26   US Breast Ltd Uni Left Inc Axilla  Result Date: 06/05/2017 CLINICAL DATA:  Recall from screening mammography with tomosynthesis, possible mass or focal asymmetry in the upper inner subareolar left breast. EXAM: 2D DIGITAL DIAGNOSTIC LEFT MAMMOGRAM WITH ADJUNCT TOMO ULTRASOUND LEFT BREAST COMPARISON:  Mammography 06/01/2017, 05/29/2016 and earlier. Left breast ultrasound 03/24/2011 was performed in a different part of the breast. ACR Breast Density Category d: The breast tissue is extremely dense, which lowers the sensitivity of mammography. FINDINGS: Standard and tomosynthesis spot compression CC and MLO views of the area of concern in the left breast were obtained. The spot compression MLO views demonstrates a circumscribed oval medium density mass measuring approximately 6 mm without associated architectural distortion or suspicious calcification. This is not visualized on the spot compression CC view. On physical exam, there is no palpable abnormality in the upper inner periareolar left breast. Targeted left breast ultrasound is performed, showing a possible intraductal mass at the 10 o'clock position approximately 1 cm from the nipple measuring approximately 3 x 7 x 7 mm. There is no internal power Doppler flow. Sonographic evaluation the left axilla demonstrates no pathologic lymphadenopathy. IMPRESSION: 1.  Approximate 7 mm possible intraductal mass in the upper inner subareolar left breast which accounts for the area of concern on screening mammography. 2. No pathologic left axillary lymphadenopathy. RECOMMENDATION: Ultrasound-guided core needle biopsy of the possible intraductal mass in the left breast in order to exclude a high risk papilloma or DCIS. The ultrasound biopsy procedure was discussed with the patient and her questions were answered. She has agreed to proceed and the biopsy has been scheduled for Friday, August 10 at 3:45 p.m. I have discussed the findings and recommendations with the patient. Results were also provided in writing at the conclusion of the visit. BI-RADS CATEGORY  4: Suspicious. Electronically Signed   By: Evangeline Dakin M.D.   On: 06/05/2017 16:37   Mm Diag Breast Tomo Uni Left  Result Date: 06/05/2017 CLINICAL DATA:  Recall from screening mammography with tomosynthesis, possible mass or focal asymmetry in the upper inner subareolar left breast. EXAM: 2D DIGITAL DIAGNOSTIC LEFT MAMMOGRAM WITH ADJUNCT TOMO ULTRASOUND LEFT BREAST COMPARISON:  Mammography 06/01/2017, 05/29/2016 and earlier. Left breast ultrasound 03/24/2011 was performed in a different part of the breast. ACR Breast Density Category d: The breast tissue is extremely dense, which lowers the sensitivity of mammography. FINDINGS: Standard and tomosynthesis spot compression CC and MLO views of the area of concern in the left breast were obtained. The spot compression MLO views demonstrates a circumscribed oval medium density mass measuring approximately 6 mm without  associated architectural distortion or suspicious calcification. This is not visualized on the spot compression CC view. On physical exam, there is no palpable abnormality in the upper inner periareolar left breast. Targeted left breast ultrasound is performed, showing a possible intraductal mass at the 10 o'clock position approximately 1 cm from the nipple  measuring approximately 3 x 7 x 7 mm. There is no internal power Doppler flow. Sonographic evaluation the left axilla demonstrates no pathologic lymphadenopathy. IMPRESSION: 1. Approximate 7 mm possible intraductal mass in the upper inner subareolar left breast which accounts for the area of concern on screening mammography. 2. No pathologic left axillary lymphadenopathy. RECOMMENDATION: Ultrasound-guided core needle biopsy of the possible intraductal mass in the left breast in order to exclude a high risk papilloma or DCIS. The ultrasound biopsy procedure was discussed with the patient and her questions were answered. She has agreed to proceed and the biopsy has been scheduled for Friday, August 10 at 3:45 p.m. I have discussed the findings and recommendations with the patient. Results were also provided in writing at the conclusion of the visit. BI-RADS CATEGORY  4: Suspicious. Electronically Signed   By: Evangeline Dakin M.D.   On: 06/05/2017 16:37   Mm Screening Breast Tomo Bilateral  Result Date: 06/02/2017 CLINICAL DATA:  Screening. EXAM: 2D DIGITAL SCREENING BILATERAL MAMMOGRAM WITH CAD AND ADJUNCT TOMO COMPARISON:  Previous exam(s). ACR Breast Density Category c: The breast tissue is heterogeneously dense, which may obscure small masses. FINDINGS: In the left breast, a possible asymmetry warrants further evaluation. In the right breast, no findings suspicious for malignancy. Images were processed with CAD. IMPRESSION: Further evaluation is suggested for possible asymmetry in the left breast. RECOMMENDATION: Diagnostic mammogram and possibly ultrasound of the left breast. (Code:[id number]) The patient will be contacted regarding the findings, and additional imaging will be scheduled. BI-RADS CATEGORY  0: Incomplete. Need additional imaging evaluation and/or prior mammograms for comparison. Electronically Signed   By: Fidela Salisbury M.D.   On: 06/02/2017 11:08    Lateef Juncaj M.D on 06/12/2017 at  3:22 PM  Between 7am to 7pm - Pager - [phone]  After 7pm go to [email or web address] - password [email or web address]  [facility] -  Office  [phone]

## 2017-06-12 NOTE — Progress Notes (Signed)
Initial Nutrition Assessment  DOCUMENTATION CODES:   Not applicable  INTERVENTION:  - Continue Ensure Enlive BID, each supplement provides 350 kcal and 20 grams of protein - Continue to encourage PO intakes of meals, supplements, and beverages.   NUTRITION DIAGNOSIS:   Inadequate oral intake related to acute illness, poor appetite as evidenced by per patient/family report, meal completion < 25%.  GOAL:   Patient will meet greater than or equal to 90% of their needs  MONITOR:   PO intake, Supplement acceptance, Weight trends, Labs  REASON FOR ASSESSMENT:   Consult Assessment of nutrition requirement/status  ASSESSMENT:   76 y.o. 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 She was discharged earlier today, about 4pm.  When they arrived home, it was difficult to get her out of the car - can't really move her legs, can't walk.  She fell backwards onto the concrete but did not hit her head.  Her husband couldn't get her up and so he called 911 and they brought her back in.  She was recommended to go to SNF at the time of discharge and she didn't want to go there so they decided to go home with home health.  He now realizes that this was a mistake.  Medically unchanged from the time of discharge.  Husband reports that she sleeps a lot at baseline.  Pt seen for MST. BMI indicates overweight status, appropriate for age. Per chart review, pt consumed 10% of breakfast this AM. Husband is currently at bedside and reports that he was not present for breakfast. He was attempting to feed pt lunch: grilled cheese sandwich and tomato soup. He reports that pt usually had a good appetite with no recent changes  until 1 week ago when appetite declined. He state that pt mainly only eats bites of food at meals for the past 1 week. Husband reports that pt did drink over 1/2 of an Ensure Enlive earlier this afternoon. Husband not overly interested in talking with RD at this time as he was focused on trying to help pt eat more for lunch; pt is alert and oriented to self only. Will ask additional questions at follow-up and try to talk with husband during a non-meal time.  Physical assessment deferred until follow-up to allow pt to focus on trying to consume lunch. Per chart review, weight was stable (142-145 lb) from 02/14/16-11/21/16. Weight on 8/8 documented as 151 lb and weight today is again 145 lb. Suspect weight increase was fluid related during previous admission.   Medications reviewed; 100 mg Colace BID, sliding scale Novolog, 8 units Levemir/day, 500 mg Magonate BID, daily multivitamin with minerals, 40 mg oral Protonix/day, 500 mg ascorbic acid/day.  Labs reviewed; K: 3.4 mmol/L, BUN: 32 mg/dL, creatinine: 1.41 mg/dL, Ca: 8.6 mg/dL, GFR: 35 mL/min.  IVF: NS @ 100 mL/hr.    Diet Order:  Diet Carb Modified Fluid consistency: Thin; Room service appropriate? Yes  Skin:  Reviewed, no issues  Last BM:  8/9  Height:   Ht Readings from Last 1 Encounters:  06/11/17 5\' 5"  (1.651 m)    Weight:   Wt Readings from Last 1 Encounters:  06/12/17 145 lb 9.6 oz (66 kg)    Ideal Body Weight:  56.82 kg  BMI:  Body mass index is 24.23 kg/m.  Estimated Nutritional Needs:   Kcal:  1650-1850 (25-28 kcal/kg)  Protein:  60-70 grams  Fluid:  >/= 1.8 L/day  EDUCATION NEEDS:   No education needs identified at this time    Jarome Matin, MS, RD, LDN, CNSC Inpatient Clinical Dietitian Pager # [phone] After hours/weekend pager # [phone]

## 2017-06-12 NOTE — Care Management Note (Signed)
Case Management Note  Patient Details  Name: [name] [name] MRN: [id number] Date of Birth: 04/30/1941  Subjective/Objective:  CSW following for SNF                  Action/Plan:d/c SNF   Expected Discharge Date:   (unknown)               Expected Discharge Plan:  Skilled Nursing Facility  In-House Referral:  Clinical Social Work  Discharge planning Services  CM Consult  Post Acute Care Choice:   ([facility]HRN/PT/OT) Choice offered to:     DME Arranged:    DME Agency:     HH Arranged:    [facility] Agency:     Status of Service:  Completed, signed off  If discussed at [employer], dates discussed:    Additional Comments:  Dessa Phi, RN 06/12/2017, 2:56 PM

## 2017-06-13 DIAGNOSIS — E119 Type 2 diabetes mellitus without complications: Secondary | ICD-10-CM

## 2017-06-13 DIAGNOSIS — Z794 Long term (current) use of insulin: Secondary | ICD-10-CM

## 2017-06-13 DIAGNOSIS — Z7189 Other specified counseling: Secondary | ICD-10-CM

## 2017-06-13 DIAGNOSIS — E43 Unspecified severe protein-calorie malnutrition: Secondary | ICD-10-CM

## 2017-06-13 DIAGNOSIS — Z515 Encounter for palliative care: Secondary | ICD-10-CM

## 2017-06-13 DIAGNOSIS — I10 Essential (primary) hypertension: Secondary | ICD-10-CM

## 2017-06-13 LAB — CBC
HCT: 30.4 % — ABNORMAL LOW (ref 36.0–46.0)
Hemoglobin: 10.1 g/dL — ABNORMAL LOW (ref 12.0–15.0)
MCH: 32.8 pg (ref 26.0–34.0)
MCHC: 33.2 g/dL (ref 30.0–36.0)
MCV: 98.7 fL (ref 78.0–100.0)
Platelets: 473 10*3/uL — ABNORMAL HIGH (ref 150–400)
RBC: 3.08 MIL/uL — ABNORMAL LOW (ref 3.87–5.11)
RDW: 13.9 % (ref 11.5–15.5)
WBC: 17.1 10*3/uL — ABNORMAL HIGH (ref 4.0–10.5)

## 2017-06-13 LAB — BASIC METABOLIC PANEL WITH GFR
Anion gap: 12 (ref 5–15)
BUN: 26 mg/dL — ABNORMAL HIGH (ref 6–20)
CO2: 31 mmol/L (ref 22–32)
Calcium: 9.1 mg/dL (ref 8.9–10.3)
Chloride: 104 mmol/L (ref 101–111)
Creatinine, Ser: 1.26 mg/dL — ABNORMAL HIGH (ref 0.44–1.00)
GFR calc Af Amer: 47 mL/min — ABNORMAL LOW
GFR calc non Af Amer: 40 mL/min — ABNORMAL LOW
Glucose, Bld: 140 mg/dL — ABNORMAL HIGH (ref 65–99)
Potassium: 3.1 mmol/L — ABNORMAL LOW (ref 3.5–5.1)
Sodium: 147 mmol/L — ABNORMAL HIGH (ref 135–145)

## 2017-06-13 LAB — GLUCOSE, CAPILLARY
GLUCOSE-CAPILLARY: 117 mg/dL — AB (ref 65–99)
GLUCOSE-CAPILLARY: 248 mg/dL — AB (ref 65–99)
Glucose-Capillary: 335 mg/dL — ABNORMAL HIGH (ref 65–99)
Glucose-Capillary: 139 mg/dL — ABNORMAL HIGH (ref 65–99)

## 2017-06-13 LAB — URINE CULTURE: Culture: <10000 — AB

## 2017-06-13 MED ORDER — POTASSIUM CHLORIDE CRYS ER 20 MEQ PO TBCR
40.0000 meq | EXTENDED_RELEASE_TABLET | Freq: Four times a day (QID) | ORAL | Status: AC
  Administered 2017-06-13 (×2): 40 meq via ORAL
  Filled 2017-06-13 (×2): qty 2

## 2017-06-13 MED ORDER — DEXTROSE 5 % IV SOLN
2.0000 g | INTRAVENOUS | Status: DC
  Administered 2017-06-13 – 2017-06-15 (×3): 2 g via INTRAVENOUS
  Filled 2017-06-13 (×4): qty 2

## 2017-06-13 MED ORDER — SODIUM CHLORIDE 0.45 % IV SOLN
INTRAVENOUS | Status: DC
  Administered 2017-06-13: 16:00:00 via INTRAVENOUS

## 2017-06-13 NOTE — Progress Notes (Signed)
PROGRESS NOTE                                                                                                                                                                                                             Patient Demographics:    [name] [name], is a 76 y.o. female, DOB - [birth year], RWE:[id number]  Admit date - 06/11/2017   Admitting Physician Karmen Bongo, MD  Outpatient Primary MD for the patient is [name], MD  LOS - 1   Chief Complaint  Patient presents with  . Loss of Consciousness       Brief Narrative   y.o. 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   Subjective:    [name] [name] today Denies any complaints, no cough, no fever, no chills    Assessment  & Plan :    Principal Problem:   Weakness Active Problems:   DM type 2 (diabetes mellitus, type 2) (HCC)   HTN (hypertension)   CKD (chronic kidney disease) stage 3, GFR 30-59 ml/min   Protein-calorie malnutrition, severe (HCC)   Bacteremia due to Escherichia coli   Partial symptomatic epilepsy with complex partial seizures, not intractable, without status epilepticus ([place])   Sepsis ([place])   Anemia   CAP (community acquired pneumonia)   Weakness - Secondary to recent prolonged hospital stay from sepsis,Recommendation wear for SNF  discharge, but patient/family has declined, patient did not do well in route to home, readmitted again, PT following  recommendation for SNF placement, social worker involved.  Escherichia coli bacteremia - Patient with 1/2 positive blood cultures during prior hospitalization, pansensitive, she was discharged on Keflex , will continue with IV  Rocephin during hospital stay, to be transitioned to Keflex on discharge. - Of note, the patient also had E coli bacteremia in 1/18 and so ID consultation was suggested at the time of discharge today  CKD -Patient with baseline creatinine 1.6-1.7, was 1.8 on admission, today is 1.2 as she was continued on IV fluids as she received fluid bolus in ED -She did have renal failure during prior  hospitalization with creatinine peak at 2.19; renal US 8/4 with mild righthydronephrosis  HTN -Medication changes during hospitalization include reduction in Cardizem and transition from BID Lopressor to Toprol XL  Anemia -Hgb stable from earlier today, but decreased during hospitalization -Will follow  DM -Recent A1c 7.3 -Continue Levemir and SSI  Malnutrition -Patient had SLP evaluation during hospitalization but does not appear to have had nutrition consult - will add  Seizures -Continue Depakote, follow on level  Hypokalemia - Repleted, recheck in a.m.  Hypernatremia - We'll change normal saline to half-normal saline, recheck in a.m.   Code Status : Full  Family Communication  : None at bedside  Disposition Plan  : We'll need SNF placement   Consults  :  none  Procedures  : none  DVT Prophylaxis  :  Lovenox   Lab Results  Component Value Date   PLT 473 (H) 06/13/2017    Antibiotics  :    Anti-infectives    Start     Dose/Rate Route Frequency Ordered Stop   06/13/17 0800  cefTRIAXone (ROCEPHIN) 2 g in dextrose 5 % 50 mL IVPB     2 g 100 mL/hr over 30 Minutes Intravenous Every 24 hours 06/13/17 0756     06/12/17 0156  cephALEXin (KEFLEX) 500 MG capsule    Comments:  [name] [name], [name] [name]: cabinet override      06/12/17 0156 06/12/17 0216   06/12/17 0145  cephALEXin (KEFLEX) capsule 500 mg  Status:  Discontinued     500 mg Oral 2 times daily 06/12/17 0132 06/13/17 0756   06/11/17 2100  cefTRIAXone (ROCEPHIN) 2 g in dextrose 5 % 50 mL IVPB     2 g 100 mL/hr over  30 Minutes Intravenous  Once 06/11/17 2050 06/11/17 2217        Objective:   Vitals:   06/13/17 0443 06/13/17 0649 06/13/17 1213 06/13/17 1315  BP: (!) 164/109 (!) 170/79 139/62 (!) 141/75  Pulse: (!) 111  91 98  Resp: 20   16  Temp: 98 F (36.7 C)   98.8 F (37.1 C)  TempSrc: Oral   Oral  SpO2: 97%   98%  Weight:      Height:        Wt Readings from Last 3 Encounters:  06/12/17 66 kg (145 lb 9.6 oz)  06/10/17 68.5 kg (151 lb 0.2 oz)  11/21/16 66 kg (145 lb 8.1 oz)     Intake/Output Summary (Last 24 hours) at 06/13/17 1338 Last data filed at 06/13/17 1315  Gross per 24 hour  Intake          1111.67 ml  Output             1500 ml  Net          -388.33 ml     Physical Exam  Elderly female, sitting in bed in no apparent distress  Good air entry bilaterally, clear to auscultation, no use of accessory muscles  Regular rate and rhythm, no rubs murmurs gallops  Soft, nontender, nondistended, bowel sounds present  No Cyanosis, Clubbing or edema, No new Rash or bruise      Data Review:    CBC  Recent Labs Lab 06/08/17 0500 06/11/17 0412 06/11/17 1948 06/12/17 0513 06/13/17 0518  WBC 15.4* 17.1* 18.8* 17.0* 17.1*  HGB 11.0* 9.9* 9.8* 10.3* 10.1*  HCT 34.2* 31.3* 29.3* 31.7* 30.4*  PLT 217 320 312 400 473*  MCV 98.8 101.0* 99.3 100.0 98.7  MCH 31.8  31.9 33.2 32.5 32.8  MCHC 32.2 31.6 33.4 32.5 33.2  RDW 13.1 13.9 13.7 13.7 13.9  LYMPHSABS  --   --  1.9  --   --   MONOABS  --   --  2.9*  --   --   EOSABS  --   --  0.1  --   --   BASOSABS  --   --  0.0  --   --     Chemistries   Recent Labs Lab 06/09/17 1014 06/11/17 0412 06/11/17 1948 06/12/17 0513 06/13/17 0518  NA 138 140 140 143 147*  K 4.0 3.9 4.0 3.4* 3.1*  CL 101 98* 101 103 104  CO2 26 30 30 27 31   GLUCOSE 249* 299* 342* 244* 140*  BUN 31* 30* 38* 32* 26*  CREATININE 1.69* 1.64* 1.84* 1.41* 1.26*  CALCIUM 8.5* 8.8* 8.6* 8.6* 9.1  AST  --   --  27  --   --   ALT  --   --  19  --   --    ALKPHOS  --   --  98  --   --   BILITOT  --   --  0.6  --   --    ------------------------------------------------------------------------------------------------------------------ No results for input(s): CHOL, HDL, LDLCALC, TRIG, CHOLHDL, LDLDIRECT in the last 72 hours.  Lab Results  Component Value Date   HGBA1C 7.6 (H) 06/07/2017   ------------------------------------------------------------------------------------------------------------------ No results for input(s): TSH, T4TOTAL, T3FREE, THYROIDAB in the last 72 hours.  Invalid input(s): FREET3 ------------------------------------------------------------------------------------------------------------------ No results for input(s): VITAMINB12, FOLATE, FERRITIN, TIBC, IRON, RETICCTPCT in the last 72 hours.  Coagulation profile No results for input(s): INR, PROTIME in the last 168 hours.  No results for input(s): DDIMER in the last 72 hours.  Cardiac Enzymes No results for input(s): CKMB, TROPONINI, MYOGLOBIN in the last 168 hours.  Invalid input(s): CK ------------------------------------------------------------------------------------------------------------------ No results found for: BNP  Inpatient Medications  Scheduled Meds: . aspirin EC  81 mg Oral Daily  . atorvastatin  10 mg Oral q1800  . diltiazem  60 mg Oral BID  . divalproex  750 mg Oral Daily  . docusate sodium  100 mg Oral BID  . enoxaparin (LOVENOX) injection  30 mg Subcutaneous Q24H  . feeding supplement (ENSURE ENLIVE)  237 mL Oral BID BM  . insulin aspart  0-15 Units Subcutaneous TID WC  . insulin aspart  0-5 Units Subcutaneous QHS  . insulin detemir  8 Units Subcutaneous Q2200  . magnesium gluconate  500 mg Oral BID  . metoprolol succinate  25 mg Oral Daily  . multivitamin with minerals  1 tablet Oral Daily  . pantoprazole  40 mg Oral Daily  . potassium chloride  40 mEq Oral Q6H  . sodium chloride flush  3 mL Intravenous Q12H  . vitamin C  500  mg Oral Daily   Continuous Infusions: . sodium chloride    . cefTRIAXone (ROCEPHIN)  IV 2 g (06/13/17 0911)   PRN Meds:.acetaminophen **OR** acetaminophen, hydrOXYzine, ondansetron **OR** ondansetron (ZOFRAN) IV, polyethylene glycol  Micro Results Recent Results (from the past 240 hour(s))  Culture, blood (Routine x 2)     Status: Abnormal   Collection Time: 06/05/17 10:35 PM  Result Value Ref Range Status   Specimen Description BLOOD RIGHT ANTECUBITAL  Final   Special Requests   Final    BOTTLES DRAWN AEROBIC AND ANAEROBIC Blood Culture adequate volume   Culture  Setup Time   Final  GRAM NEGATIVE RODS IN BOTH AEROBIC AND ANAEROBIC BOTTLES CRITICAL RESULT CALLED TO, READ BACK BY AND VERIFIED WITH: R RUMBARGER,PHARMD AT 1149 06/06/17 BY L BENFIELD    Culture ESCHERICHIA COLI (A)  Final   Report Status 06/08/2017 FINAL  Final   Organism ID, Bacteria ESCHERICHIA COLI  Final      Susceptibility   Escherichia coli - MIC*    AMPICILLIN 4 SENSITIVE Sensitive     CEFAZOLIN <=4 SENSITIVE Sensitive     CEFEPIME <=1 SENSITIVE Sensitive     CEFTAZIDIME <=1 SENSITIVE Sensitive     CEFTRIAXONE <=1 SENSITIVE Sensitive     CIPROFLOXACIN <=0.25 SENSITIVE Sensitive     GENTAMICIN <=1 SENSITIVE Sensitive     IMIPENEM <=0.25 SENSITIVE Sensitive     TRIMETH/SULFA <=20 SENSITIVE Sensitive     AMPICILLIN/SULBACTAM <=2 SENSITIVE Sensitive     PIP/TAZO <=4 SENSITIVE Sensitive     Extended ESBL NEGATIVE Sensitive     * ESCHERICHIA COLI  Blood Culture ID Panel (Reflexed)     Status: Abnormal   Collection Time: 06/05/17 10:35 PM  Result Value Ref Range Status   Enterococcus species NOT DETECTED NOT DETECTED Final   Listeria monocytogenes NOT DETECTED NOT DETECTED Final   Staphylococcus species NOT DETECTED NOT DETECTED Final   Staphylococcus aureus NOT DETECTED NOT DETECTED Final   Streptococcus species NOT DETECTED NOT DETECTED Final   Streptococcus agalactiae NOT DETECTED NOT DETECTED Final    Streptococcus pneumoniae NOT DETECTED NOT DETECTED Final   Streptococcus pyogenes NOT DETECTED NOT DETECTED Final   Acinetobacter baumannii NOT DETECTED NOT DETECTED Final   Enterobacteriaceae species DETECTED (A) NOT DETECTED Final    Comment: Enterobacteriaceae represent a large family of gram-negative bacteria, not a single organism. CRITICAL RESULT CALLED TO, READ BACK BY AND VERIFIED WITH: R RUMBARGER,PHARMD AT 1149 06/06/17 BY L BENFIELD    Enterobacter cloacae complex NOT DETECTED NOT DETECTED Final   Escherichia coli DETECTED (A) NOT DETECTED Final    Comment: CRITICAL RESULT CALLED TO, READ BACK BY AND VERIFIED WITH: R RUMBARGER,PHARMD AT 1149 06/06/17 BY L BENFIELD    Klebsiella oxytoca NOT DETECTED NOT DETECTED Final   Klebsiella pneumoniae NOT DETECTED NOT DETECTED Final   Proteus species NOT DETECTED NOT DETECTED Final   Serratia marcescens NOT DETECTED NOT DETECTED Final   Carbapenem resistance NOT DETECTED NOT DETECTED Final   Haemophilus influenzae NOT DETECTED NOT DETECTED Final   Neisseria meningitidis NOT DETECTED NOT DETECTED Final   Pseudomonas aeruginosa NOT DETECTED NOT DETECTED Final   Candida albicans NOT DETECTED NOT DETECTED Final   Candida glabrata NOT DETECTED NOT DETECTED Final   Candida krusei NOT DETECTED NOT DETECTED Final   Candida parapsilosis NOT DETECTED NOT DETECTED Final   Candida tropicalis NOT DETECTED NOT DETECTED Final  Culture, blood (Routine x 2)     Status: None   Collection Time: 06/05/17 10:47 PM  Result Value Ref Range Status   Specimen Description BLOOD LEFT FOREARM  Final   Special Requests IN PEDIATRIC BOTTLE Blood Culture adequate volume  Final   Culture NO GROWTH 5 DAYS  Final   Report Status 06/10/2017 FINAL  Final  Urine culture     Status: Abnormal   Collection Time: 06/05/17 11:03 PM  Result Value Ref Range Status   Specimen Description URINE, CATHETERIZED  Final   Special Requests NONE  Final   Culture <10,000 COLONIES/mL  INSIGNIFICANT GROWTH (A)  Final   Report Status 06/07/2017 FINAL  Final  Blood culture (  routine x 2)     Status: None (Preliminary result)   Collection Time: 06/11/17  8:09 PM  Result Value Ref Range Status   Specimen Description BLOOD RIGHT HAND  Final   Special Requests   Final    BOTTLES DRAWN AEROBIC AND ANAEROBIC Blood Culture adequate volume   Culture   Final    NO GROWTH 2 DAYS Performed at [facility], [address] [place], [place] [address]    Report Status PENDING  Incomplete  Blood culture (routine x 2)     Status: None (Preliminary result)   Collection Time: 06/11/17  8:10 PM  Result Value Ref Range Status   Specimen Description BLOOD RIGHT ANTECUBITAL  Final   Special Requests IN PEDIATRIC BOTTLE Blood Culture adequate volume  Final   Culture   Final    NO GROWTH 2 DAYS Performed at [facility], [place] [address] [place], [place] [address]    Report Status PENDING  Incomplete  Urine culture     Status: Abnormal   Collection Time: 06/11/17 10:15 PM  Result Value Ref Range Status   Specimen Description URINE, CLEAN CATCH  Final   Special Requests NONE  Final   Culture (A)  Final    <10,000 COLONIES/mL INSIGNIFICANT GROWTH Performed at [facility], [place] [address] [place], [place] [address]    Report Status 06/13/2017 FINAL  Final    Radiology Reports Dg Chest 1 View  Result Date: 06/11/2017 CLINICAL DATA:  Syncope. EXAM: CHEST 1 VIEW COMPARISON:  06/05/2017 and prior chest radiographs FINDINGS: The cardiomediastinal silhouette is unremarkable. There is no evidence of focal airspace disease, pulmonary edema, suspicious pulmonary nodule/mass, pleural effusion, or pneumothorax. No acute bony abnormalities are identified. IMPRESSION: No active disease. Electronically Signed   By: Margarette Canada M.D.   On: 06/11/2017 20:32   Dg Chest 2 View  Result Date: 06/05/2017 CLINICAL DATA:  Fever, weakness for 2 days. History of stroke, hypertension, diabetes.  EXAM: CHEST  2 VIEW COMPARISON:  Chest x-ray dated 11/15/2016. FINDINGS: Heart size and mediastinal contours are within normal limits. Atherosclerotic changes noted at the aortic arch. Lungs are clear. No pleural effusion or pneumothorax seen. No acute or suspicious osseous finding. Calcifications about both shoulders suggests chronic calcific tendinopathy. IMPRESSION: No active cardiopulmonary disease. No evidence of pneumonia or pulmonary edema. Aortic atherosclerosis. Electronically Signed   By: Franki Cabot M.D.   On: 06/05/2017 23:39   Ct Head Wo Contrast  Result Date: 06/11/2017 CLINICAL DATA:  Syncope. EXAM: CT HEAD WITHOUT CONTRAST TECHNIQUE: Contiguous axial images were obtained from the base of the skull through the vertex without intravenous contrast. COMPARISON:  11/15/2016 FINDINGS: Brain: There is no evidence for acute hemorrhage, hydrocephalus, mass lesion, or abnormal extra-axial fluid collection. No definite CT evidence for acute infarction. Diffuse loss of parenchymal volume is consistent with atrophy. Patchy low attenuation in the deep hemispheric and periventricular white matter is nonspecific, but likely reflects chronic microvascular ischemic demyelination. Vascular: No hyperdense vessel or unexpected calcification. Skull: No evidence for fracture. No worrisome lytic or sclerotic lesion. Sinuses/Orbits: The visualized paranasal sinuses and mastoid air cells are clear. Visualized portions of the globes and intraorbital fat are unremarkable. Other: As noted on prior studies, there is a soft tissue nodule measuring up to about 18 mm, incompletely visualize, in the posterior left nose, superficial to the medial aspect of the left maxillary sinus. IMPRESSION: 1. Stable.  No acute intracranial abnormality. 2. Atrophy with  chronic small vessel white matter ischemic disease. Electronically Signed   By: Misty Stanley M.D.   On: 06/11/2017 20:25   US Renal  Result Date: 06/06/2017 CLINICAL DATA:   76 year old female with acute renal insufficiency, altered mental status, and fever. EXAM: RENAL / URINARY TRACT ULTRASOUND COMPLETE COMPARISON:  Ultrasound dated 05/12/2017 FINDINGS: Right Kidney: Length: 8.7 cm. There is mild increased echogenicity and parenchymal thinning. There is mild right hydronephrosis. There is a 1.0 x 0.7 x 1.0 cm hypoechoic lesion in the upper pole, likely a complex cyst, similar to prior ultrasound. Left Kidney: Length: 9.4 cm. Mild increased echogenicity. There is parenchymal atrophy and cortical thinning. There is a 4.2 x 3.2 x 2.7 cm inferior pole cyst. No hydronephrosis or echogenic stone. Bladder: Appears normal for degree of bladder distention. IMPRESSION: 1. Mild increased echogenicity and parenchymal atrophy. 2. Mild right hydronephrosis.  No obstructing stone. 3. Left renal cyst.  Probable right renal complex cyst. 4. Unremarkable urinary bladder. Electronically Signed   By: Anner Crete M.D.   On: 06/06/2017 01:26   US Breast Ltd Uni Left Inc Axilla  Result Date: 06/05/2017 CLINICAL DATA:  Recall from screening mammography with tomosynthesis, possible mass or focal asymmetry in the upper inner subareolar left breast. EXAM: 2D DIGITAL DIAGNOSTIC LEFT MAMMOGRAM WITH ADJUNCT TOMO ULTRASOUND LEFT BREAST COMPARISON:  Mammography 06/01/2017, 05/29/2016 and earlier. Left breast ultrasound 03/24/2011 was performed in a different part of the breast. ACR Breast Density Category d: The breast tissue is extremely dense, which lowers the sensitivity of mammography. FINDINGS: Standard and tomosynthesis spot compression CC and MLO views of the area of concern in the left breast were obtained. The spot compression MLO views demonstrates a circumscribed oval medium density mass measuring approximately 6 mm without associated architectural distortion or suspicious calcification. This is not visualized on the spot compression CC view. On physical exam, there is no palpable abnormality in  the upper inner periareolar left breast. Targeted left breast ultrasound is performed, showing a possible intraductal mass at the 10 o'clock position approximately 1 cm from the nipple measuring approximately 3 x 7 x 7 mm. There is no internal power Doppler flow. Sonographic evaluation the left axilla demonstrates no pathologic lymphadenopathy. IMPRESSION: 1. Approximate 7 mm possible intraductal mass in the upper inner subareolar left breast which accounts for the area of concern on screening mammography. 2. No pathologic left axillary lymphadenopathy. RECOMMENDATION: Ultrasound-guided core needle biopsy of the possible intraductal mass in the left breast in order to exclude a high risk papilloma or DCIS. The ultrasound biopsy procedure was discussed with the patient and her questions were answered. She has agreed to proceed and the biopsy has been scheduled for Friday, August 10 at 3:45 p.m. I have discussed the findings and recommendations with the patient. Results were also provided in writing at the conclusion of the visit. BI-RADS CATEGORY  4: Suspicious. Electronically Signed   By: Evangeline Dakin M.D.   On: 06/05/2017 16:37   Mm Diag Breast Tomo Uni Left  Result Date: 06/05/2017 CLINICAL DATA:  Recall from screening mammography with tomosynthesis, possible mass or focal asymmetry in the upper inner subareolar left breast. EXAM: 2D DIGITAL DIAGNOSTIC LEFT MAMMOGRAM WITH ADJUNCT TOMO ULTRASOUND LEFT BREAST COMPARISON:  Mammography 06/01/2017, 05/29/2016 and earlier. Left breast ultrasound 03/24/2011 was performed in a different part of the breast. ACR Breast Density Category d: The breast tissue is extremely dense, which lowers the sensitivity of mammography. FINDINGS: Standard and tomosynthesis spot compression CC and MLO  views of the area of concern in the left breast were obtained. The spot compression MLO views demonstrates a circumscribed oval medium density mass measuring approximately 6 mm without  associated architectural distortion or suspicious calcification. This is not visualized on the spot compression CC view. On physical exam, there is no palpable abnormality in the upper inner periareolar left breast. Targeted left breast ultrasound is performed, showing a possible intraductal mass at the 10 o'clock position approximately 1 cm from the nipple measuring approximately 3 x 7 x 7 mm. There is no internal power Doppler flow. Sonographic evaluation the left axilla demonstrates no pathologic lymphadenopathy. IMPRESSION: 1. Approximate 7 mm possible intraductal mass in the upper inner subareolar left breast which accounts for the area of concern on screening mammography. 2. No pathologic left axillary lymphadenopathy. RECOMMENDATION: Ultrasound-guided core needle biopsy of the possible intraductal mass in the left breast in order to exclude a high risk papilloma or DCIS. The ultrasound biopsy procedure was discussed with the patient and her questions were answered. She has agreed to proceed and the biopsy has been scheduled for Friday, August 10 at 3:45 p.m. I have discussed the findings and recommendations with the patient. Results were also provided in writing at the conclusion of the visit. BI-RADS CATEGORY  4: Suspicious. Electronically Signed   By: Evangeline Dakin M.D.   On: 06/05/2017 16:37   Mm Screening Breast Tomo Bilateral  Result Date: 06/02/2017 CLINICAL DATA:  Screening. EXAM: 2D DIGITAL SCREENING BILATERAL MAMMOGRAM WITH CAD AND ADJUNCT TOMO COMPARISON:  Previous exam(s). ACR Breast Density Category c: The breast tissue is heterogeneously dense, which may obscure small masses. FINDINGS: In the left breast, a possible asymmetry warrants further evaluation. In the right breast, no findings suspicious for malignancy. Images were processed with CAD. IMPRESSION: Further evaluation is suggested for possible asymmetry in the left breast. RECOMMENDATION: Diagnostic mammogram and possibly ultrasound  of the left breast. (Code:[id number]-[id number]) The patient will be contacted regarding the findings, and additional imaging will be scheduled. BI-RADS CATEGORY  0: Incomplete. Need additional imaging evaluation and/or prior mammograms for comparison. Electronically Signed   By: Fidela Salisbury M.D.   On: 06/02/2017 11:08    Sankalp Ferrell M.D on 06/13/2017 at 1:38 PM  Between 7am to 7pm - Pager - [phone]  After 7pm go to [email or web address] - password [email or web address]  [facility] -  Office  [phone]

## 2017-06-13 NOTE — Consult Note (Signed)
Consultation Note Date: 06/13/2017   Patient Name: [name] [name]  DOB: 10/10/1941  MRN: [id number]  Age / Sex: 76 y.o., female  PCP: [name], MD Referring Physician: Albertine Patricia, MD  Reason for Consultation: Establishing goals of care  HPI/Patient Profile: 76 y.o. 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  Clinical Assessment and Goals of Care:  76 year old female was recently admitted to the hospital with Escherichia coli bacteremia. She was placed on IV antibiotics then discharged on oral antibiotics. The patient went home but reportedly the patient and her husband and her daughter were not able to bring the patient inside the house due to her profound weakness. Patient is reportedly known to have suffered subacute decline. Prior to the previous hospitalization it is noted that the patient was out of bed and walking sometimes used a cane for assistance. She does have underlying history of stroke and seizures in the past. The patient has had a rather subacute kind of decline.  Patient is resting in bed. She is to participate in physical therapy. She is confused. She is not eating much. She is to go to skilled nursing facility towards the end of this hospitalization. I met with her and her husband was at the bedside. Patient was able to to me that they've been married for 56 years. She drank some ensure and took her by mouth medications but is not eating well overall. I introduced myself and palliative care as follows: Palliative medicine is specialized medical care for people living with serious illness. It focuses on  providing relief from the symptoms and stress of a serious illness. The goal is to improve quality of life for both the patient and the family.  See discussion/recommendations below. Thank you for the consult.  NEXT [facility] KIN  lives at home with husband and daughter.   SUMMARY OF RECOMMENDATIONS    Discussed about code status in detail with patient and husband. Await their decision making.  Agree with SNF rehab on D/C, recommend palliative services follow patient over there.  Monitor PO intake or lack there of, to continue to monitor overall disease trajectory.  No additional palliative recommendations at this time, thank you for the consult.   Code Status/Advance Care Planning:  Full code    Symptom Management:    as above   Palliative Prophylaxis:   Delirium Protocol  Additional Recommendations (Limitations, Scope, Preferences):  Full Scope Treatment  Psycho-social/Spiritual:   Desire for further Chaplaincy support:no  Additional Recommendations: Caregiving  Support/Resources  Prognosis:   Unable to determine  Discharge Planning: [place] for rehab with Palliative care service follow-up      Primary Diagnoses: Present on Admission: . Sepsis ([place]) . Bacteremia due to Escherichia coli . CKD (chronic kidney disease)  stage 3, GFR 30-59 ml/min . HTN (hypertension) . Partial symptomatic epilepsy with complex partial seizures, not intractable, without status epilepticus ([place]) . Protein-calorie malnutrition, severe ([place]) . Anemia . CAP (community acquired pneumonia)   I have reviewed the medical record, interviewed the patient and family, and examined the patient. The following aspects are pertinent.  Past Medical History:  Diagnosis Date  . Anemia   . Arthritis    "a touch in my hands" (11/17/2016)  . Chronic back pain   . Chronic kidney disease (CKD), active medical management without dialysis, stage 3 (moderate)   . Depression   . GERD  (gastroesophageal reflux disease)   . High cholesterol   . Hyperlipidemia   . Hypertension   . Seizures ([place])    last seizure 2014 (11/17/2016)  . Stroke [facility]) 2013   Secondary to acute right posterior temporo-occipital intra-axial hemorrhage  . Type II diabetes mellitus ([place])    Social History   Social History  . Marital status: Married    Spouse name: N/A  . Number of children: N/A  . Years of education: N/A   Occupational History  . Retired    Social History Main Topics  . Smoking status: Never Smoker  . Smokeless tobacco: Never Used  . Alcohol use No     Comment: patient drinks caffeinated drinks.   . Drug use: No  . Sexual activity: No   Other Topics Concern  . None   Social History Narrative   Patient lives at home with her husband and daughter and has a high school education.    Family History  Problem Relation Age of Onset  . Cancer Mother   . Colon cancer Mother   . Liver disease Mother   . Breast cancer Mother   . Cancer Father   . Diabetes Paternal Grandmother   . Breast cancer Maternal Aunt   . Breast cancer Maternal Aunt   . Esophageal cancer Neg Hx   . Rectal cancer Neg Hx   . Stomach cancer Neg Hx    Scheduled Meds: . aspirin EC  81 mg Oral Daily  . atorvastatin  10 mg Oral q1800  . diltiazem  60 mg Oral BID  . divalproex  750 mg Oral Daily  . docusate sodium  100 mg Oral BID  . enoxaparin (LOVENOX) injection  30 mg Subcutaneous Q24H  . feeding supplement (ENSURE ENLIVE)  237 mL Oral BID BM  . insulin aspart  0-15 Units Subcutaneous TID WC  . insulin aspart  0-5 Units Subcutaneous QHS  . insulin detemir  8 Units Subcutaneous Q2200  . magnesium gluconate  500 mg Oral BID  . metoprolol succinate  25 mg Oral Daily  . multivitamin with minerals  1 tablet Oral Daily  . pantoprazole  40 mg Oral Daily  . potassium chloride  40 mEq Oral Q6H  . sodium chloride flush  3 mL Intravenous Q12H  . vitamin C  500 mg Oral Daily   Continuous  Infusions: . sodium chloride    . cefTRIAXone (ROCEPHIN)  IV 2 g (06/13/17 0911)   PRN Meds:.acetaminophen **OR** acetaminophen, hydrOXYzine, ondansetron **OR** ondansetron (ZOFRAN) IV, polyethylene glycol Medications Prior to Admission:  Prior to Admission medications   Medication Sig Start Date End Date Taking? Authorizing Provider  acetaminophen (TYLENOL) 325 MG tablet Take 2 tablets (650 mg total) by mouth every 6 (six) hours as needed for mild pain, moderate pain, fever or headache. 06/11/17  Yes Hongalgi, Lenis Dickinson, MD  aspirin 81 MG tablet Take 81 mg by mouth daily.   Yes [provider]  atorvastatin (LIPITOR) 10 MG tablet Take 10 mg by mouth daily.  02/11/13  Yes [provider]  divalproex (DEPAKOTE ER) 250 MG 24 hr tablet Take 3 tablets (750 mg total) by mouth daily. 12/23/16  Yes Garvin Fila, MD  LEVEMIR FLEXTOUCH 100 UNIT/ML Pen INJECT 8 UNITS SUBCUTANEOUSLY DAILY AT NIGHT 05/14/15  Yes [provider]  Magnesium 500 MG TABS Take 1 tablet (500 mg total) by mouth 2 (two) times daily. 06/11/17  Yes Hongalgi, Lenis Dickinson, MD  Multiple Vitamin (MULTIVITAMIN) capsule Take 1 capsule by mouth daily.   Yes [provider]  NOVOLOG FLEXPEN 100 UNIT/ML SOPN FlexPen Inject 4-9 Units into the skin 3 (three) times daily with meals. Per sliding scale, 4 units if over 200 = 5 units in the morning and at lunch and 8 units if over 200 = 9 units. 07/29/13  Yes [provider]  omeprazole (PRILOSEC) 20 MG capsule Take 20 mg by mouth every morning.  03/17/15  Yes [provider]  vitamin C (ASCORBIC ACID) 500 MG tablet Take 500 mg by mouth daily.   Yes [provider]  cephALEXin (KEFLEX) 500 MG capsule Take 1 capsule (500 mg total) by mouth 2 (two) times daily. 06/11/17   Hongalgi, Lenis Dickinson, MD  diltiazem (CARDIZEM) 60 MG tablet Take 1 tablet (60 mg total) by mouth 2 (two) times daily. 06/11/17   Hongalgi, Lenis Dickinson, MD  hydrOXYzine (ATARAX/VISTARIL) 25 MG  tablet TAKE 1 TABLET BY MOUTH AT BEDTIME AS NEEDED FOR ITCHING 04/25/15   [provider]  metoprolol succinate (TOPROL-XL) 25 MG 24 hr tablet Take 1 tablet (25 mg total) by mouth daily. 06/12/17   Hongalgi, Lenis Dickinson, MD  polyethylene glycol (MIRALAX / GLYCOLAX) packet Take 17 g by mouth daily as needed for moderate constipation. 06/11/17   Hongalgi, Lenis Dickinson, MD   Allergies  Allergen Reactions  . Barbiturates     Becomes restless.  Also with all "strong" medications like sleeping pills  . Latex Itching   Review of Systems +generazlied weakness  Physical Exam Weak, chronically ill appearing lady resting in bed S1-S2 Lungs clear to auscultation Abdomen soft nontender No edema Flat affect  Vital Signs: BP (!) 141/75 (BP Location: Left Arm)   Pulse 98   Temp 98.8 F (37.1 C) (Oral)   Resp 16   Ht '5\' 5"'  (1.651 m)   Wt 66 kg (145 lb 9.6 oz)   SpO2 98%   BMI 24.23 kg/m  Pain Assessment: Faces   Pain Score: 0-No pain   SpO2: SpO2: 98 % O2 Device:SpO2: 98 % O2 Flow Rate: .   IO: Intake/output summary:  Intake/Output Summary (Last 24 hours) at 06/13/17 1456 Last data filed at 06/13/17 1315  Gross per 24 hour  Intake          1111.67 ml  Output             1500 ml  Net          -388.33 ml    LBM: Last BM Date: 06/11/17 Baseline Weight: Weight: 68.5 kg (151 lb) Most recent weight: Weight: 66 kg (145 lb 9.6 oz)     Palliative Assessment/Data:   Flowsheet Rows     Most Recent Value  Intake Tab  Referral Department  Hospitalist  Unit at Time of Referral  Med/Surg Unit  Palliative Care Primary Diagnosis  Sepsis/Infectious Disease  Palliative Care Type  New Palliative care  Reason for referral  Clarify Goals of Care  Date first seen by Palliative Care  06/13/17  Clinical Assessment  Palliative Performance Scale Score  30%  Pain Max last 24 hours  4  Pain Min Last 24 hours  3  Dyspnea Max Last 24 Hours  4  Dyspnea Min Last 24 hours  3  Nausea Max Last 24 Hours   4  Nausea Min Last 24 Hours  3  Anxiety Max Last 24 Hours  6  Anxiety Min Last 24 Hours  4  Psychosocial & Spiritual Assessment  Palliative Care Outcomes  Patient/Family meeting held?  Yes  Who was at the meeting?  patient and wife   Palliative Care Outcomes  Clarified goals of care      Time In:  1400 Time Out:  1500 Time Total:  60 min  Greater than 50%  of this time was spent counseling and coordinating care related to the above assessment and plan.  Signed by: Loistine Chance, MD  [phone]  Please contact Palliative Medicine Team phone at [phone] for questions and concerns.  For individual provider: See Shea Evans

## 2017-06-13 NOTE — Evaluation (Signed)
Physical Therapy Evaluation Patient Details Name: [name] [name] MRN: [id number] DOB: 06-13-1941 Today's Date: 06/13/2017   History of Present Illness  [name] [name] is a 76 y.o. 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 Pt currently with functional limitations due to the deficits listed below (see PT Problem List). Pt will benefit from skilled PT to increase their independence and safety with mobility to allow discharge to the venue listed below.   Pt lethargic on arrival but aroused fairly easily with multiple stimuli, able to maintain appropriate level of alertness once aroused; strongly recommend SNF post acute given course of events leading up to this admission/recent hospital adm;will continue to follow in acute setting     Follow Up Recommendations SNF;Supervision/Assistance - 24 hour    Equipment Recommendations  None recommended by PT    Recommendations for Other Services       Precautions / Restrictions Precautions Precautions: Fall Restrictions Weight Bearing Restrictions: No      Mobility  Bed Mobility Overal bed mobility: Needs Assistance Bed Mobility: Supine to Sit;Sit to Supine           General bed mobility comments: assist with LEs on and off bed, assist to bring trunk to upright; multi-modal cues for sequencing and self assist  Transfers Overall transfer level: Needs assistance Equipment used: Rolling walker (2 wheeled) Transfers: Sit to/from Stand Sit to Stand: Mod assist;Min assist         General transfer comment: sit to stand x2 from EOB, min/mod assist to rise and stabilize; multi-modal cues to stay on  task, for sequencing  Ambulation/Gait             General Gait Details: NT  with +1 assist  Stairs            Wheelchair Mobility    Modified Rankin (Stroke Patients Only)       Balance Overall balance assessment: Needs assistance Sitting-balance support: Feet supported Sitting balance-Leahy Scale: Fair       Standing balance-Leahy Scale: Zero                               Pertinent Vitals/Pain Pain Assessment: Faces Faces Pain Scale: Hurts a little bit Pain Location: right LE Pain Descriptors / Indicators: Discomfort;Grimacing Pain Intervention(s): Monitored during session    Home Living Family/patient expects to be discharged to:: Unsure Living Arrangements: Spouse/significant other;Children Available Help at Discharge: Family;Available 24 hours/day Type of Home: House Home Access: Stairs to enter Entrance Stairs-Rails: None Entrance Stairs-Number of Steps: 1-- tall 8' step Home Layout: One level Home Equipment: Walker - 2 wheels Additional Comments: pt unable to answer questions regarding home situation; husband gives info that partially conflicts with previous notes    Prior Function     Gait / Transfers Assistance Needed: husband reports pt IND prior to last adm  ADL's / Homemaking Assistance Needed: assist with tub transfers per pt husband        Hand Dominance        Extremity/Trunk Assessment   Upper Extremity Assessment Upper Extremity Assessment: Defer to OT evaluation RUE Deficits / Details: baseline tremors  bil UEs    Lower Extremity Assessment Lower Extremity Assessment: Generalized weakness RLE Deficits / Details: tremors at rest       Communication   Communication: HOH  Cognition Arousal/Alertness: Lethargic Behavior During Therapy: Flat affect Overall Cognitive Status: Impaired/Different from baseline Area of Impairment: Attention;Following commands;Safety/judgement;Awareness;Problem solving                    Current Attention Level: Sustained   Following Commands: Follows one step commands inconsistently Safety/Judgement: Decreased awareness of deficits;Decreased awareness of safety   Problem Solving: Slow processing;Requires verbal cues;Requires tactile cues;Difficulty sequencing        General Comments      Exercises     Assessment/Plan    PT Assessment Patient needs continued PT services  PT Problem List Decreased strength;Decreased activity tolerance;Decreased balance;Decreased mobility;Decreased cognition;Decreased safety awareness       PT Treatment Interventions Functional mobility training;Therapeutic exercise;Balance training;Patient/family education;DME instruction;Gait training;Therapeutic activities    PT Goals (Current goals can be found in the Care Plan section)  Acute Rehab PT Goals Patient Stated Goal: pt unable to state; husband states for pt to get stronger PT Goal Formulation: With family Time For Goal Achievement: 06/27/17 Potential to Achieve Goals: Fair    Frequency Min 3X/week   Barriers to discharge Decreased caregiver support      Co-evaluation               AM-PAC PT "6 Clicks" Daily Activity  Outcome Measure Difficulty turning over in bed (including adjusting bedclothes, sheets and blankets)?: Total Difficulty moving from lying on back to sitting on the side of the bed? : Total Difficulty sitting down on and standing up from a chair with arms (e.g., wheelchair, bedside commode, etc,.)?: Total Help needed moving to and from a bed to chair (including a wheelchair)?: Total Help needed walking in hospital room?: Total Help needed climbing 3-5 steps with a railing? : Total 6 Click Score: 6    End of Session Equipment Utilized During Treatment: Gait belt Activity Tolerance: Patient tolerated treatment well;Patient limited by fatigue Patient left: with call bell/phone within reach;with chair alarm set;with family/visitor  present;in bed;with bed alarm set   PT Visit Diagnosis: Unsteadiness on feet (R26.81);Muscle weakness (generalized) (M62.81)    Time: 2244-9753 PT Time Calculation (min) (ACUTE ONLY): 24 min   Charges:   PT Evaluation $PT Eval Moderate Complexity: 1 Mod PT Treatments $Therapeutic Activity: 8-22 mins   PT G CodesKenyon Ana, PT Pager: [phone] 06/13/2017   [facility] 06/13/2017, 1:15 PM

## 2017-06-14 DIAGNOSIS — L8990 Pressure ulcer of unspecified site, unspecified stage: Secondary | ICD-10-CM | POA: Insufficient documentation

## 2017-06-14 LAB — CBC
HCT: 30.8 % — ABNORMAL LOW (ref 36.0–46.0)
Hemoglobin: 10 g/dL — ABNORMAL LOW (ref 12.0–15.0)
MCH: 32.6 pg (ref 26.0–34.0)
MCHC: 32.5 g/dL (ref 30.0–36.0)
MCV: 100.3 fL — AB (ref 78.0–100.0)
PLATELETS: 507 10*3/uL — AB (ref 150–400)
RBC: 3.07 MIL/uL — AB (ref 3.87–5.11)
RDW: 14.3 % (ref 11.5–15.5)
WBC: 15.9 10*3/uL — ABNORMAL HIGH (ref 4.0–10.5)

## 2017-06-14 LAB — BASIC METABOLIC PANEL
Anion gap: 10 (ref 5–15)
BUN: 23 mg/dL — AB (ref 6–20)
CO2: 30 mmol/L (ref 22–32)
Calcium: 8.7 mg/dL — ABNORMAL LOW (ref 8.9–10.3)
Chloride: 102 mmol/L (ref 101–111)
Creatinine, Ser: 1.18 mg/dL — ABNORMAL HIGH (ref 0.44–1.00)
GFR calc Af Amer: 51 mL/min — ABNORMAL LOW (ref >60)
GFR, EST NON AFRICAN AMERICAN: 44 mL/min — AB (ref >60)
GLUCOSE: 184 mg/dL — AB (ref 65–99)
POTASSIUM: 3.8 mmol/L (ref 3.5–5.1)
Sodium: 142 mmol/L (ref 135–145)

## 2017-06-14 LAB — GLUCOSE, CAPILLARY
GLUCOSE-CAPILLARY: 125 mg/dL — AB (ref 65–99)
Glucose-Capillary: 117 mg/dL — ABNORMAL HIGH (ref 65–99)
Glucose-Capillary: 277 mg/dL — ABNORMAL HIGH (ref 65–99)
Glucose-Capillary: 263 mg/dL — ABNORMAL HIGH (ref 65–99)

## 2017-06-14 MED ORDER — PHENOL 1.4 % MT LIQD
1.0000 | OROMUCOSAL | Status: DC | PRN
  Filled 2017-06-14: qty 177

## 2017-06-14 MED ORDER — ENSURE ENLIVE PO LIQD: 237.0000 mL | Freq: Two times a day (BID) | ORAL | Status: DC

## 2017-06-14 MED ORDER — BOOST / RESOURCE BREEZE PO LIQD
1.0000 | Freq: Two times a day (BID) | ORAL | Status: DC
  Administered 2017-06-14 – 2017-06-15 (×2): 1 via ORAL

## 2017-06-14 MED ORDER — ENSURE ENLIVE PO LIQD
237.0000 mL | Freq: Three times a day (TID) | ORAL | Status: DC
  Administered 2017-06-14 – 2017-06-15 (×3): 237 mL via ORAL

## 2017-06-14 MED ORDER — MENTHOL 3 MG MT LOZG
1.0000 | LOZENGE | OROMUCOSAL | Status: DC | PRN
  Filled 2017-06-14: qty 9

## 2017-06-14 NOTE — Evaluation (Signed)
Occupational Therapy Evaluation Patient Details Name: [name] [name] MRN: [id number] DOB: 31-Mar-1941 Today's Date: 06/14/2017    History of Present Illness Xee Hollman is a 76 y.o. 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 Returned pt to supine with nursing present. Will benefit from continued OT to progress ADL independence for next venue.     Follow Up Recommendations  Supervision/Assistance - 24 hour;SNF    Equipment Recommendations   (defer to next venue)    Recommendations for Other Services       Precautions / Restrictions Precautions Precautions: Fall Restrictions Weight Bearing Restrictions: No      Mobility Bed Mobility Overal bed mobility: Needs Assistance Bed Mobility: Supine to Sit;Sit to Supine     Supine to sit: Mod assist Sit to supine: Mod assist   General bed mobility comments: assist for trunk to upright with cues. Pt able to assist with the scoot to EOB with assist. For return to supine, pt needed assist with LEs onto bed and to support trunk back to the bed.   Transfers                 General transfer comment: pt unable to stand today. Pt reports her back is hurting too much.    Balance Overall balance assessment: Needs assistance Sitting-balance support: Feet supported Sitting balance-Leahy Scale: Fair                                     ADL either performed or assessed with clinical judgement   ADL Overall ADL's : Needs assistance/impaired Eating/Feeding: Moderate assistance;Bed level   Grooming: Brushing  hair;Sitting;Moderate assistance Grooming Details (indicate cue type and reason): for thoroughness with brushing hair. Tends to brush same spots over. Upper Body Bathing: Maximal assistance;Sitting   Lower Body Bathing: Total assistance;Bed level   Upper Body Dressing : Maximal assistance;Sitting   Lower Body Dressing: Total assistance;Bed level                 General ADL Comments: Sat EOB for approximately 5-6 minutes to perform grooming task with pt noted to lean to the R and forward. Attempted to stand with walker but pt began complaining of her back hurting and started to lie down without notice. Assisted back back to supine. Pt able to help with the scooting to the EOB with verbal cues.      Vision Patient Visual Report: No change from baseline       Perception     Praxis      Pertinent Vitals/Pain Pain Assessment: 0-10 Faces Pain Scale: Hurts even more Pain Location: back  Pain Descriptors / Indicators: Grimacing;Crying Pain Intervention(s): Repositioned;Other (comment) (nurse present in room)     Hand Dominance     Extremity/Trunk Assessment Upper Extremity Assessment RUE Deficits / Details: baseline tremors bil UEs LUE Deficits / Details: Limited bilateral shoulder ROM to approximately 90 degrees. Weak grasp bilaterally.           Communication Communication Communication: HOH   Cognition Arousal/Alertness:  Lethargic Behavior During Therapy: Flat affect Overall Cognitive Status: Impaired/Different from baseline Area of Impairment: Attention;Safety/judgement;Following commands                   Current Attention Level: Sustained   Following Commands: Follows one step commands inconsistently Safety/Judgement: Decreased awareness of deficits;Decreased awareness of safety   Problem Solving: Slow processing;Requires verbal cues;Requires tactile cues     General Comments       Exercises     Shoulder Instructions      Home Living  Family/patient expects to be discharged to:: Unsure Living Arrangements: Spouse/significant other;Children Available Help at Discharge: Family;Available 24 hours/day Type of Home: House Home Access: Stairs to enter [employer] of Steps: 1-- tall 8' step Entrance Stairs-Rails: None Home Layout: One level     Bathroom Shower/Tub: Teacher, early years/pre: Standard     Home Equipment: Walker - 2 wheels   Additional Comments: some bathroom information taken from previous notes.      Prior Functioning/Environment Level of Independence: Needs assistance  Gait / Transfers Assistance Needed: husband reports pt IND prior to last adm ADL's / Homemaking Assistance Needed: assist with tub transfers per pt husband            OT Problem List: Decreased strength;Decreased knowledge of use of DME or AE;Impaired balance (sitting and/or standing)      OT Treatment/Interventions: Self-care/ADL training;Patient/family education;DME and/or AE instruction;Therapeutic activities    OT Goals(Current goals can be found in the care plan section) Acute Rehab OT Goals Patient Stated Goal: husband wants pt to get stronger. Pt unable to state. OT Goal Formulation: With patient Time For Goal Achievement: 06/28/17 Potential to Achieve Goals: Good  OT Frequency: Min 2X/week   Barriers to D/C:            Co-evaluation              AM-PAC PT "6 Clicks" Daily Activity     Outcome Measure Help from another person eating meals?: A Lot Help from another person taking care of personal grooming?: A Lot Help from another person toileting, which includes using toliet, bedpan, or urinal?: Total Help from another person bathing (including washing, rinsing, drying)?: Total Help from another person to put on and taking off regular upper body clothing?: A Lot Help from another person to put on and taking off regular lower body clothing?: Total 6 Click Score: 9   End of Session  Nurse Communication: Mobility status  Activity Tolerance: Patient limited by pain Patient left: in bed;with call bell/phone within reach;with bed alarm set  OT Visit Diagnosis: Muscle weakness (generalized) (M62.81)                Time: 5277-8242 OT Time Calculation (min): 48 min Charges:  OT General Charges $OT Visit: 1 Procedure OT Evaluation $OT Eval Moderate Complexity: 1 Procedure OT Treatments $Self Care/Home Management : 8-22 mins G-Codes:       Philippa Chester 06/14/2017, 12:21 PM

## 2017-06-14 NOTE — Clinical Social Work Note (Signed)
Clinical Social Work Assessment  Patient Details  Name: [name] [name] MRN: [id number] Date of Birth: June 28, 1941  Date of referral:  06/14/17               Reason for consult:  Facility Placement                Permission sought to share information with:  Family Supports Permission granted to share information::  Yes, Verbal Permission Granted  Name::     husband Advertising account planner::     Relationship::     Contact Information:     Housing/Transportation Living arrangements for the past 2 months:  Single Family Home Source of Information:  Patient, Spouse, Medical Team Patient Interpreter Needed:  None Criminal Activity/Legal Involvement Pertinent to Current Situation/Hospitalization:  No - Comment as needed Significant Relationships:  Adult Children, Spouse Lives with:  Spouse Do you feel safe going back to the place where you live?  Yes Need for family participation in patient care:  No (Coment)  Care giving concerns: Pt from home where she reside with husband. Pt discharged home with husband 06/11/17 after hospitalization during which pt/husband declined SNF. Husband states, "We got home and couldn't even get her in the house and came right back to the hospital. Her care is more than I can handle." Husband states pt functioning has declined "very quickly, she was not needing this much help just a couple months ago."   Facilities manager / plan:  CSW consulted for potential SNF placement. Met with pt and husband at bedside. Pt pleasant and answered when directly addresses but was largely unengaged and husband provided information. Both are agreeable to SNF referrals however CSW discussed possible barriers identified as pt's case was reviewed. Namely, that pt has not been able to participate greatly in therapy interventions and that her eating is still poor per notes, palliative involved and pt's prognosis undetermined.  Pt has [employer] as Education administrator as secondary. Husband  states that pt went to rehab 4 years ago "and Medicare covered what BCBS didn't." CSW will investigate payor options. If BCBS is primary, prior Josem Kaufmann will be needed.  Husband understanding of potential barriers and states that should SNF not be covered by insurance, theyn would want to pursue going home with "lots of home health help. Would probably need a private aide." Prefers [facility] if available.  Completed FL2 and made referrals to area SNFs. Noted that palliative care will be needed to follow at SNF.  Plan: potential for SNF at [place]. Will follow for bed offers and to monitor appropriateness as case progresses.   Employment status:  Retired Forensic scientist:  Information systems manager, Public librarian) PT Recommendations:  [address], [place] / Referral to community resources:  [place]  Patient/Family's Response to care:  Appreciative of care  Patient/Family's Understanding of and Emotional Response to Diagnosis, Current Treatment, and Prognosis:  Pt does not respond much but does demonstrate she understands that she is agreeing for CSW to pursue SNF care for her. Husband demonstrates adequate understanding of process and the potential barriers, e.i., that pt currently is not mobile therefore insurance may not authorize rehab and consider placement custodial.   Emotional Assessment Appearance:  Appears stated age Attitude/Demeanor/Rapport:   (calm, not engaged) Affect (typically observed):  Flat Orientation:  Oriented to Self, Oriented to Place, Oriented to  Time, Oriented to Situation Alcohol / Substance use:  Not Applicable Psych involvement (Current and /or in  the community):  No (Comment)  Discharge Needs  Concerns to be addressed:  Discharge Planning Concerns Readmission within the last 30 days:  Yes Current discharge risk:  Dependent with Mobility Barriers to Discharge:  Continued Medical Work up, [employer], LCSW 06/14/2017, 3:36 PM  Weekend coverage [phone]

## 2017-06-14 NOTE — Progress Notes (Signed)
PROGRESS NOTE                                                                                                                                                                                                             Patient Demographics:    [name] [name], is a 76 y.o. female, DOB - 1941-09-27, YNW:[id number]  Admit date - 06/11/2017   Admitting Physician Karmen Bongo, MD  Outpatient Primary MD for the patient is [name], MD  LOS - 2   Chief Complaint  Patient presents with  . Loss of Consciousness       Brief Narrative   76 y.o. 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   Subjective:    [name] [name] today Denies any complaints, no cough, no fever, no chills    Assessment  & Plan :    Principal Problem:   Weakness Active Problems:   DM type 2 (diabetes mellitus, type 2) (HCC)   HTN (hypertension)   CKD (chronic kidney disease) stage 3, GFR 30-59 ml/min   Protein-calorie malnutrition, severe (HCC)   Bacteremia due to Escherichia coli   Partial symptomatic epilepsy with complex partial seizures, not intractable, without status epilepticus ([place])   Sepsis ([place])   Anemia   CAP (community acquired pneumonia)   Encounter for palliative care   Goals of care, counseling/discussion   Pressure injury of skin   Weakness - Secondary to recent prolonged hospital stay from sepsis,Recommendation wear for SNF  discharge, but patient/family has declined, patient did not do well in route to home, readmitted again, PT following  recommendation for SNF placement, social worker involved.  Escherichia coli bacteremia - Patient with 1/2 positive blood  cultures during prior hospitalization, pansensitive, she was discharged on Keflex , currently on IV rocephin during hospital stay, can be discharged on oral keflex with SNF bed is available. - Of note, the patient also had E coli bacteremia in 1/18 and so ID consultation was suggested at the time of discharge today  CKD -Patient with baseline creatinine 1.6-1.7, was 1.8 on admission, continued to  trend down, it is 1.18 today, -She did have renal failure during prior hospitalization with creatinine peak at 2.19; renal US 8/4 with mild righthydronephrosis  HTN -Medication changes during hospitalization include reduction in Cardizem and transition from BID Lopressor to Toprol XL  Anemia -Hgb stable from earlier today, but decreased during hospitalization -Will follow  DM -Recent A1c 7.3 -Continue Levemir and SSI  Malnutrition -Patient had SLP evaluation during hospitalization but does not appear to have had nutrition consult - will add  Seizures -Continue Depakote, follow on level  Hypokalemia - Repleted  Hypernatremia -Volume 147 on 8/11, has improved on half-normal saline, it is 142 today, I will discontinue today as her oral intake has improved   Code Status : Full  Family Communication  : None at bedside  Disposition Plan  : We'll need SNF placement   Consults  :  Palliative medicine  Procedures  : none  DVT Prophylaxis  :  Lovenox   Lab Results  Component Value Date   PLT 507 (H) 06/14/2017    Antibiotics  :    Anti-infectives    Start     Dose/Rate Route Frequency Ordered Stop   06/13/17 0800  cefTRIAXone (ROCEPHIN) 2 g in dextrose 5 % 50 mL IVPB     2 g 100 mL/hr over 30 Minutes Intravenous Every 24 hours 06/13/17 0756     06/12/17 0156  cephALEXin (KEFLEX) 500 MG capsule    Comments:  Campos-Garcia, Kelly: cabinet override      06/12/17 0156 06/12/17 0216   06/12/17 0145  cephALEXin (KEFLEX) capsule 500 mg  Status:  Discontinued     500 mg Oral  2 times daily 06/12/17 0132 06/13/17 0756   06/11/17 2100  cefTRIAXone (ROCEPHIN) 2 g in dextrose 5 % 50 mL IVPB     2 g 100 mL/hr over 30 Minutes Intravenous  Once 06/11/17 2050 06/11/17 2217        Objective:   Vitals:   06/13/17 1315 06/13/17 2211 06/14/17 0635 06/14/17 1259  BP: (!) 141/75 (!) 164/71 (!) 150/79 121/60  Pulse: 98 (!) 101 (!) 103 92  Resp: 16 16 16 16   Temp: 98.8 F (37.1 C) 98.4 F (36.9 C) 97.8 F (36.6 C) 98.3 F (36.8 C)  TempSrc: Oral Oral Oral Axillary  SpO2: 98% 97% 95% 96%  Weight:      Height:        Wt Readings from Last 3 Encounters:  06/12/17 66 kg (145 lb 9.6 oz)  06/10/17 68.5 kg (151 lb 0.2 oz)  11/21/16 66 kg (145 lb 8.1 oz)     Intake/Output Summary (Last 24 hours) at 06/14/17 1438 Last data filed at 06/14/17 1300  Gross per 24 hour  Intake          2211.25 ml  Output             2050 ml  Net           161.25 ml     Physical Exam  Elderly female, sitting in bed in no apparent distress  Good air entry bilaterally, clear to auscultation, no use of accessory muscles  Regular rate and rhythm, no rubs murmurs gallops  Soft, nontender, nondistended, bowel sounds present  No Cyanosis, Clubbing or edema, No new Rash or bruise      Data Review:    CBC  Recent Labs Lab 06/11/17 0412 06/11/17 1948 06/12/17 0513 06/13/17 0518 06/14/17 0522  WBC 17.1* 18.8* 17.0* 17.1* 15.9*  HGB 9.9* 9.8* 10.3* 10.1* 10.0*  HCT 31.3* 29.3* 31.7* 30.4* 30.8*  PLT 320 312 400 473* 507*  MCV 101.0* 99.3 100.0 98.7 100.3*  MCH 31.9 33.2 32.5 32.8 32.6  MCHC 31.6 33.4 32.5 33.2 32.5  RDW 13.9 13.7 13.7 13.9 14.3  LYMPHSABS  --  1.9  --   --   --   MONOABS  --  2.9*  --   --   --   EOSABS  --  0.1  --   --   --   BASOSABS  --  0.0  --   --   --     Chemistries   Recent Labs Lab 06/11/17 0412 06/11/17 1948 06/12/17 0513 06/13/17 0518 06/14/17 0522  NA 140 140 143 147* 142  K 3.9 4.0 3.4* 3.1* 3.8  CL 98* 101 103 104 102  CO2 30  30 27 31 30   GLUCOSE 299* 342* 244* 140* 184*  BUN 30* 38* 32* 26* 23*  CREATININE 1.64* 1.84* 1.41* 1.26* 1.18*  CALCIUM 8.8* 8.6* 8.6* 9.1 8.7*  AST  --  27  --   --   --   ALT  --  19  --   --   --   ALKPHOS  --  98  --   --   --   BILITOT  --  0.6  --   --   --    ------------------------------------------------------------------------------------------------------------------ No results for input(s): CHOL, HDL, LDLCALC, TRIG, CHOLHDL, LDLDIRECT in the last 72 hours.  Lab Results  Component Value Date   HGBA1C 7.6 (H) 06/07/2017   ------------------------------------------------------------------------------------------------------------------ No results for input(s): TSH, T4TOTAL, T3FREE, THYROIDAB in the last 72 hours.  Invalid input(s): FREET3 ------------------------------------------------------------------------------------------------------------------ No results for input(s): VITAMINB12, FOLATE, FERRITIN, TIBC, IRON, RETICCTPCT in the last 72 hours.  Coagulation profile No results for input(s): INR, PROTIME in the last 168 hours.  No results for input(s): DDIMER in the last 72 hours.  Cardiac Enzymes No results for input(s): CKMB, TROPONINI, MYOGLOBIN in the last 168 hours.  Invalid input(s): CK ------------------------------------------------------------------------------------------------------------------ No results found for: BNP  Inpatient Medications  Scheduled Meds: . aspirin EC  81 mg Oral Daily  . atorvastatin  10 mg Oral q1800  . diltiazem  60 mg Oral BID  . divalproex  750 mg Oral Daily  . docusate sodium  100 mg Oral BID  . enoxaparin (LOVENOX) injection  30 mg Subcutaneous Q24H  . feeding supplement  1 Container Oral BID BM  . feeding supplement (ENSURE ENLIVE)  237 mL Oral TID BM  . insulin aspart  0-15 Units Subcutaneous TID WC  . insulin aspart  0-5 Units Subcutaneous QHS  . insulin detemir  8 Units Subcutaneous Q2200  . magnesium  gluconate  500 mg Oral BID  . metoprolol succinate  25 mg Oral Daily  . multivitamin with minerals  1 tablet Oral Daily  . pantoprazole  40 mg Oral Daily  . sodium chloride flush  3 mL Intravenous Q12H  . vitamin C  500 mg Oral Daily   Continuous Infusions: . sodium chloride 75 mL/hr at 06/13/17 1559  . cefTRIAXone (ROCEPHIN)  IV Stopped (06/14/17 0914)   PRN Meds:.acetaminophen **OR** acetaminophen, hydrOXYzine, ondansetron **OR** ondansetron (ZOFRAN) IV, polyethylene glycol  Micro Results Recent Results (from the past 240 hour(s))  Culture, blood (Routine x 2)     Status: Abnormal   Collection Time: 06/05/17 10:35 PM  Result Value Ref Range Status   Specimen Description BLOOD RIGHT  ANTECUBITAL  Final   Special Requests   Final    BOTTLES DRAWN AEROBIC AND ANAEROBIC Blood Culture adequate volume   Culture  Setup Time   Final    GRAM NEGATIVE RODS IN BOTH AEROBIC AND ANAEROBIC BOTTLES CRITICAL RESULT CALLED TO, READ BACK BY AND VERIFIED WITH: R RUMBARGER,PHARMD AT 1610 06/06/17 BY L BENFIELD    Culture ESCHERICHIA COLI (A)  Final   Report Status 06/08/2017 FINAL  Final   Organism ID, Bacteria ESCHERICHIA COLI  Final      Susceptibility   Escherichia coli - MIC*    AMPICILLIN 4 SENSITIVE Sensitive     CEFAZOLIN <=4 SENSITIVE Sensitive     CEFEPIME <=1 SENSITIVE Sensitive     CEFTAZIDIME <=1 SENSITIVE Sensitive     CEFTRIAXONE <=1 SENSITIVE Sensitive     CIPROFLOXACIN <=0.25 SENSITIVE Sensitive     GENTAMICIN <=1 SENSITIVE Sensitive     IMIPENEM <=0.25 SENSITIVE Sensitive     TRIMETH/SULFA <=20 SENSITIVE Sensitive     AMPICILLIN/SULBACTAM <=2 SENSITIVE Sensitive     PIP/TAZO <=4 SENSITIVE Sensitive     Extended ESBL NEGATIVE Sensitive     * ESCHERICHIA COLI  Blood Culture ID Panel (Reflexed)     Status: Abnormal   Collection Time: 06/05/17 10:35 PM  Result Value Ref Range Status   Enterococcus species NOT DETECTED NOT DETECTED Final   Listeria monocytogenes NOT DETECTED  NOT DETECTED Final   Staphylococcus species NOT DETECTED NOT DETECTED Final   Staphylococcus aureus NOT DETECTED NOT DETECTED Final   Streptococcus species NOT DETECTED NOT DETECTED Final   Streptococcus agalactiae NOT DETECTED NOT DETECTED Final   Streptococcus pneumoniae NOT DETECTED NOT DETECTED Final   Streptococcus pyogenes NOT DETECTED NOT DETECTED Final   Acinetobacter baumannii NOT DETECTED NOT DETECTED Final   Enterobacteriaceae species DETECTED (A) NOT DETECTED Final    Comment: Enterobacteriaceae represent a large family of gram-negative bacteria, not a single organism. CRITICAL RESULT CALLED TO, READ BACK BY AND VERIFIED WITH: R RUMBARGER,PHARMD AT 1149 06/06/17 BY L BENFIELD    Enterobacter cloacae complex NOT DETECTED NOT DETECTED Final   Escherichia coli DETECTED (A) NOT DETECTED Final    Comment: CRITICAL RESULT CALLED TO, READ BACK BY AND VERIFIED WITH: R RUMBARGER,PHARMD AT 1149 06/06/17 BY L BENFIELD    Klebsiella oxytoca NOT DETECTED NOT DETECTED Final   Klebsiella pneumoniae NOT DETECTED NOT DETECTED Final   Proteus species NOT DETECTED NOT DETECTED Final   Serratia marcescens NOT DETECTED NOT DETECTED Final   Carbapenem resistance NOT DETECTED NOT DETECTED Final   Haemophilus influenzae NOT DETECTED NOT DETECTED Final   Neisseria meningitidis NOT DETECTED NOT DETECTED Final   Pseudomonas aeruginosa NOT DETECTED NOT DETECTED Final   Candida albicans NOT DETECTED NOT DETECTED Final   Candida glabrata NOT DETECTED NOT DETECTED Final   Candida krusei NOT DETECTED NOT DETECTED Final   Candida parapsilosis NOT DETECTED NOT DETECTED Final   Candida tropicalis NOT DETECTED NOT DETECTED Final  Culture, blood (Routine x 2)     Status: None   Collection Time: 06/05/17 10:47 PM  Result Value Ref Range Status   Specimen Description BLOOD LEFT FOREARM  Final   Special Requests IN PEDIATRIC BOTTLE Blood Culture adequate volume  Final   Culture NO GROWTH 5 DAYS  Final   Report  Status 06/10/2017 FINAL  Final  Urine culture     Status: Abnormal   Collection Time: 06/05/17 11:03 PM  Result Value Ref Range Status  Specimen Description URINE, CATHETERIZED  Final   Special Requests NONE  Final   Culture <10,000 COLONIES/mL INSIGNIFICANT GROWTH (A)  Final   Report Status 06/07/2017 FINAL  Final  Blood culture (routine x 2)     Status: None (Preliminary result)   Collection Time: 06/11/17  8:09 PM  Result Value Ref Range Status   Specimen Description BLOOD RIGHT HAND  Final   Special Requests   Final    BOTTLES DRAWN AEROBIC AND ANAEROBIC Blood Culture adequate volume   Culture   Final    NO GROWTH 3 DAYS Performed at [facility], [address], [place], [place] [address]    Report Status PENDING  Incomplete  Blood culture (routine x 2)     Status: None (Preliminary result)   Collection Time: 06/11/17  8:10 PM  Result Value Ref Range Status   Specimen Description BLOOD RIGHT ANTECUBITAL  Final   Special Requests IN PEDIATRIC BOTTLE Blood Culture adequate volume  Final   Culture   Final    NO GROWTH 3 DAYS Performed at [facility], [place] [address], [place], [place] [address]    Report Status PENDING  Incomplete  Urine culture     Status: Abnormal   Collection Time: 06/11/17 10:15 PM  Result Value Ref Range Status   Specimen Description URINE, CLEAN CATCH  Final   Special Requests NONE  Final   Culture (A)  Final    <10,000 COLONIES/mL INSIGNIFICANT GROWTH Performed at [facility], [place] [address] [place], [place] [address]    Report Status 06/13/2017 FINAL  Final    Radiology Reports Dg Chest 1 View  Result Date: 06/11/2017 CLINICAL DATA:  Syncope. EXAM: CHEST 1 VIEW COMPARISON:  06/05/2017 and prior chest radiographs FINDINGS: The cardiomediastinal silhouette is unremarkable. There is no evidence of focal airspace disease, pulmonary edema, suspicious pulmonary nodule/mass, pleural effusion, or pneumothorax. No acute bony  abnormalities are identified. IMPRESSION: No active disease. Electronically Signed   By: Margarette Canada M.D.   On: 06/11/2017 20:32   Dg Chest 2 View  Result Date: 06/05/2017 CLINICAL DATA:  Fever, weakness for 2 days. History of stroke, hypertension, diabetes. EXAM: CHEST  2 VIEW COMPARISON:  Chest x-ray dated 11/15/2016. FINDINGS: Heart size and mediastinal contours are within normal limits. Atherosclerotic changes noted at the aortic arch. Lungs are clear. No pleural effusion or pneumothorax seen. No acute or suspicious osseous finding. Calcifications about both shoulders suggests chronic calcific tendinopathy. IMPRESSION: No active cardiopulmonary disease. No evidence of pneumonia or pulmonary edema. Aortic atherosclerosis. Electronically Signed   By: Franki Cabot M.D.   On: 06/05/2017 23:39   Ct Head Wo Contrast  Result Date: 06/11/2017 CLINICAL DATA:  Syncope. EXAM: CT HEAD WITHOUT CONTRAST TECHNIQUE: Contiguous axial images were obtained from the base of the skull through the vertex without intravenous contrast. COMPARISON:  11/15/2016 FINDINGS: Brain: There is no evidence for acute hemorrhage, hydrocephalus, mass lesion, or abnormal extra-axial fluid collection. No definite CT evidence for acute infarction. Diffuse loss of parenchymal volume is consistent with atrophy. Patchy low attenuation in the deep hemispheric and periventricular white matter is nonspecific, but likely reflects chronic microvascular ischemic demyelination. Vascular: No hyperdense vessel or unexpected calcification. Skull: No evidence for fracture. No worrisome lytic or sclerotic lesion. Sinuses/Orbits: The visualized paranasal sinuses and mastoid air cells are clear. Visualized portions of the globes and intraorbital fat are unremarkable. Other: As noted on prior studies, there is a soft tissue nodule  measuring up to about 18 mm, incompletely visualize, in the posterior left nose, superficial to the medial aspect of the left  maxillary sinus. IMPRESSION: 1. Stable.  No acute intracranial abnormality. 2. Atrophy with chronic small vessel white matter ischemic disease. Electronically Signed   By: Misty Stanley M.D.   On: 06/11/2017 20:25   US Renal  Result Date: 06/06/2017 CLINICAL DATA:  76 year old female with acute renal insufficiency, altered mental status, and fever. EXAM: RENAL / URINARY TRACT ULTRASOUND COMPLETE COMPARISON:  Ultrasound dated 05/12/2017 FINDINGS: Right Kidney: Length: 8.7 cm. There is mild increased echogenicity and parenchymal thinning. There is mild right hydronephrosis. There is a 1.0 x 0.7 x 1.0 cm hypoechoic lesion in the upper pole, likely a complex cyst, similar to prior ultrasound. Left Kidney: Length: 9.4 cm. Mild increased echogenicity. There is parenchymal atrophy and cortical thinning. There is a 4.2 x 3.2 x 2.7 cm inferior pole cyst. No hydronephrosis or echogenic stone. Bladder: Appears normal for degree of bladder distention. IMPRESSION: 1. Mild increased echogenicity and parenchymal atrophy. 2. Mild right hydronephrosis.  No obstructing stone. 3. Left renal cyst.  Probable right renal complex cyst. 4. Unremarkable urinary bladder. Electronically Signed   By: Anner Crete M.D.   On: 06/06/2017 01:26   US Breast Ltd Uni Left Inc Axilla  Result Date: 06/05/2017 CLINICAL DATA:  Recall from screening mammography with tomosynthesis, possible mass or focal asymmetry in the upper inner subareolar left breast. EXAM: 2D DIGITAL DIAGNOSTIC LEFT MAMMOGRAM WITH ADJUNCT TOMO ULTRASOUND LEFT BREAST COMPARISON:  Mammography 06/01/2017, 05/29/2016 and earlier. Left breast ultrasound 03/24/2011 was performed in a different part of the breast. ACR Breast Density Category d: The breast tissue is extremely dense, which lowers the sensitivity of mammography. FINDINGS: Standard and tomosynthesis spot compression CC and MLO views of the area of concern in the left breast were obtained. The spot compression MLO  views demonstrates a circumscribed oval medium density mass measuring approximately 6 mm without associated architectural distortion or suspicious calcification. This is not visualized on the spot compression CC view. On physical exam, there is no palpable abnormality in the upper inner periareolar left breast. Targeted left breast ultrasound is performed, showing a possible intraductal mass at the 10 o'clock position approximately 1 cm from the nipple measuring approximately 3 x 7 x 7 mm. There is no internal power Doppler flow. Sonographic evaluation the left axilla demonstrates no pathologic lymphadenopathy. IMPRESSION: 1. Approximate 7 mm possible intraductal mass in the upper inner subareolar left breast which accounts for the area of concern on screening mammography. 2. No pathologic left axillary lymphadenopathy. RECOMMENDATION: Ultrasound-guided core needle biopsy of the possible intraductal mass in the left breast in order to exclude a high risk papilloma or DCIS. The ultrasound biopsy procedure was discussed with the patient and her questions were answered. She has agreed to proceed and the biopsy has been scheduled for Friday, August 10 at 3:45 p.m. I have discussed the findings and recommendations with the patient. Results were also provided in writing at the conclusion of the visit. BI-RADS CATEGORY  4: Suspicious. Electronically Signed   By: Evangeline Dakin M.D.   On: 06/05/2017 16:37   Mm Diag Breast Tomo Uni Left  Result Date: 06/05/2017 CLINICAL DATA:  Recall from screening mammography with tomosynthesis, possible mass or focal asymmetry in the upper inner subareolar left breast. EXAM: 2D DIGITAL DIAGNOSTIC LEFT MAMMOGRAM WITH ADJUNCT TOMO ULTRASOUND LEFT BREAST COMPARISON:  Mammography 06/01/2017, 05/29/2016 and earlier. Left breast ultrasound 03/24/2011 was  performed in a different part of the breast. ACR Breast Density Category d: The breast tissue is extremely dense, which lowers the  sensitivity of mammography. FINDINGS: Standard and tomosynthesis spot compression CC and MLO views of the area of concern in the left breast were obtained. The spot compression MLO views demonstrates a circumscribed oval medium density mass measuring approximately 6 mm without associated architectural distortion or suspicious calcification. This is not visualized on the spot compression CC view. On physical exam, there is no palpable abnormality in the upper inner periareolar left breast. Targeted left breast ultrasound is performed, showing a possible intraductal mass at the 10 o'clock position approximately 1 cm from the nipple measuring approximately 3 x 7 x 7 mm. There is no internal power Doppler flow. Sonographic evaluation the left axilla demonstrates no pathologic lymphadenopathy. IMPRESSION: 1. Approximate 7 mm possible intraductal mass in the upper inner subareolar left breast which accounts for the area of concern on screening mammography. 2. No pathologic left axillary lymphadenopathy. RECOMMENDATION: Ultrasound-guided core needle biopsy of the possible intraductal mass in the left breast in order to exclude a high risk papilloma or DCIS. The ultrasound biopsy procedure was discussed with the patient and her questions were answered. She has agreed to proceed and the biopsy has been scheduled for Friday, August 10 at 3:45 p.m. I have discussed the findings and recommendations with the patient. Results were also provided in writing at the conclusion of the visit. BI-RADS CATEGORY  4: Suspicious. Electronically Signed   By: Evangeline Dakin M.D.   On: 06/05/2017 16:37   Mm Screening Breast Tomo Bilateral  Result Date: 06/02/2017 CLINICAL DATA:  Screening. EXAM: 2D DIGITAL SCREENING BILATERAL MAMMOGRAM WITH CAD AND ADJUNCT TOMO COMPARISON:  Previous exam(s). ACR Breast Density Category c: The breast tissue is heterogeneously dense, which may obscure small masses. FINDINGS: In the left breast, a possible  asymmetry warrants further evaluation. In the right breast, no findings suspicious for malignancy. Images were processed with CAD. IMPRESSION: Further evaluation is suggested for possible asymmetry in the left breast. RECOMMENDATION: Diagnostic mammogram and possibly ultrasound of the left breast. (Code:[id number]-L-45M) The patient will be contacted regarding the findings, and additional imaging will be scheduled. BI-RADS CATEGORY  0: Incomplete. Need additional imaging evaluation and/or prior mammograms for comparison. Electronically Signed   By: Fidela Salisbury M.D.   On: 06/02/2017 11:08    ELGERGAWY, DAWOOD M.D on 06/14/2017 at 2:38 PM  Between 7am to 7pm - Pager - [phone]  After 7pm go to [email or web address] - password [facility]  [facility] -  Office  [phone]

## 2017-06-14 NOTE — NC FL2 (Signed)
[place] LEVEL OF CARE SCREENING TOOL     IDENTIFICATION  Patient Name: [name] [name] Birthdate: 1941-09-11 Sex: female Admission Date (Current Location): 06/11/2017  [facility] and [place] Number:  Herbalist and Address:  [facility],  [place] [address] [place]      Provider Number: [id number]  Attending Physician Name and Address:  Elgergawy, Silver Huguenin, MD  Relative Name and Phone Number:       Current Level of Care: Hospital Recommended Level of Care: [place] Prior Approval Number:    Date Approved/Denied: 07/01/13 PASRR Number: [id number]  Discharge Plan: SNF    Current Diagnoses: Patient Active Problem List   Diagnosis Date Noted  . Pressure injury of skin 06/14/2017  . Encounter for palliative care   . Goals of care, counseling/discussion   . CAP (community acquired pneumonia) 06/12/2017  . Sepsis ([place]) 06/11/2017  . Weakness 06/11/2017  . Anemia 06/11/2017  . AKI (acute kidney injury) ([place]) 06/05/2017  . Sepsis, unspecified organism ([place]) 06/05/2017  . Partial symptomatic epilepsy with complex partial seizures, not intractable, without status epilepticus ([place])   . Bacteremia due to Escherichia coli 11/18/2016  . Renal insufficiency   . Aortic atherosclerosis ([place]) 11/15/2016  . UTI (urinary tract infection) 11/15/2016  . Paranoia ([place]) 05/24/2015  . Tachycardia 07/02/2013  . Hypophosphatemia 07/02/2013  . Encounter for central line placement 07/02/2013  . Hypokalemia 07/01/2013  . Hypomagnesemia 07/01/2013  . Protein-calorie malnutrition, severe ([place]) 06/29/2013  . Leukocytosis, unspecified 06/29/2013  . 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 TIA (transient ischemic attack) 12/13/2012  . 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 Syncope 09/08/2012  . Acute respiratory failure with hypoxia ([place]) 08/20/2012  . Seizure disorder, grand mal ([place]) 08/20/2012  . Seizure ([place])  08/20/2012  . CKD (chronic kidney disease) stage 3, GFR 30-59 ml/min 08/19/2012  . Leukocytosis 08/19/2012  . Hemorrhage in the brain-13 x 22 x 14 mm right posterior temporo-occipital intra-axial acute 08/19/2012  . Left hip pain 08/19/2012  . Left hemiparesis ([place]) 08/19/2012  . Hemianopia, homonymous, left 08/19/2012  . Hemisensory deficit, left 08/19/2012  . Nausea 08/19/2012  . Headache(784.0) 08/19/2012  . Acute encephalopathy 08/19/2012  . Hemorrhage of brain, nontraumatic ([place]) 08/18/2012  . Dysphagia 08/18/2012  . HTN (hypertension) 08/18/2012  . DM type 2 (diabetes mellitus, type 2) ([place]) 07/26/2012  . Hypoglycemia due to insulin 07/26/2012  . GERD (gastroesophageal reflux disease)     Orientation RESPIRATION BLADDER Height & Weight     Self, Time, Situation, Place  Normal Incontinent, External catheter Weight: 145 lb 9.6 oz (66 kg) Height:  5\' 5"  (165.1 cm)  BEHAVIORAL SYMPTOMS/MOOD NEUROLOGICAL BOWEL NUTRITION STATUS      Continent Diet (Diet Carb Modified Fluid consistency: Thin)  AMBULATORY STATUS COMMUNICATION OF NEEDS Skin   Extensive Assist Verbally PU Stage and Appropriate Care  8/11 pressure injury stage I, heel, foam dressing changes PRN                     Personal Care Assistance Level of Assistance  Bathing, Feeding, Dressing Bathing Assistance: Maximum assistance Feeding assistance: Limited assistance Dressing Assistance: Maximum assistance     Functional Limitations Info  Sight, Hearing, Speech Sight Info: Adequate Hearing Info: Adequate Speech Info: Adequate    SPECIAL CARE FACTORS FREQUENCY  PT (By licensed PT), OT (By licensed OT)     PT Frequency: 5x OT Frequency: 5x  Contractures      Additional Factors Info  Code Status, Allergies Code Status Info: Full Allergies Info: barbiturates, Latx           Current Medications (06/14/2017):  This is the current hospital active medication list Current  Facility-Administered Medications  Medication Dose Route Frequency Provider Last Rate Last Dose  . acetaminophen (TYLENOL) tablet 650 mg  650 mg Oral Q6H PRN Karmen Bongo, MD   650 mg at 06/14/17 1046   Or  . acetaminophen (TYLENOL) suppository 650 mg  650 mg Rectal Q6H PRN Karmen Bongo, MD      . aspirin EC tablet 81 mg  81 mg Oral Daily Karmen Bongo, MD   81 mg at 06/14/17 1047  . atorvastatin (LIPITOR) tablet 10 mg  10 mg Oral q1800 Karmen Bongo, MD   10 mg at 06/13/17 1746  . cefTRIAXone (ROCEPHIN) 2 g in dextrose 5 % 50 mL IVPB  2 g Intravenous Q24H Elgergawy, Silver Huguenin, MD   Stopped at 06/14/17 0914  . diltiazem (CARDIZEM) tablet 60 mg  60 mg Oral BID Karmen Bongo, MD   60 mg at 06/14/17 1047  . divalproex (DEPAKOTE ER) 24 hr tablet 750 mg  750 mg Oral Daily Karmen Bongo, MD   750 mg at 06/14/17 1047  . docusate sodium (COLACE) capsule 100 mg  100 mg Oral BID Karmen Bongo, MD   100 mg at 06/14/17 1047  . enoxaparin (LOVENOX) injection 30 mg  30 mg Subcutaneous Q24H Karmen Bongo, MD   30 mg at 06/14/17 1046  . feeding supplement (BOOST / RESOURCE BREEZE) liquid 1 Container  1 Container Oral BID BM Elgergawy, Silver Huguenin, MD   1 Container at 06/14/17 1051  . feeding supplement (ENSURE ENLIVE) (ENSURE ENLIVE) liquid 237 mL  237 mL Oral TID BM Elgergawy, Silver Huguenin, MD   237 mL at 06/14/17 1106  . hydrOXYzine (ATARAX/VISTARIL) tablet 25 mg  25 mg Oral QHS PRN Karmen Bongo, MD      . insulin aspart (novoLOG) injection 0-15 Units  0-15 Units Subcutaneous TID WC Karmen Bongo, MD   8 Units at 06/14/17 1152  . insulin aspart (novoLOG) injection 0-5 Units  0-5 Units Subcutaneous QHS Karmen Bongo, MD   2 Units at 06/13/17 2342  . insulin detemir (LEVEMIR) injection 8 Units  8 Units Subcutaneous Q2200 Karmen Bongo, MD   8 Units at 06/13/17 2342  . magnesium gluconate (MAGONATE) tablet 500 mg  500 mg Oral BID Karmen Bongo, MD   500 mg at 06/14/17 1047  .  menthol-cetylpyridinium (CEPACOL) lozenge 3 mg  1 lozenge Oral PRN Elgergawy, Silver Huguenin, MD      . metoprolol succinate (TOPROL-XL) 24 hr tablet 25 mg  25 mg Oral Daily Karmen Bongo, MD   25 mg at 06/14/17 1046  . multivitamin with minerals tablet 1 tablet  1 tablet Oral Daily Karmen Bongo, MD   1 tablet at 06/14/17 1047  . ondansetron (ZOFRAN) tablet 4 mg  4 mg Oral Q6H PRN Karmen Bongo, MD       Or  . ondansetron [facility]) injection 4 mg  4 mg Intravenous Q6H PRN Karmen Bongo, MD      . pantoprazole (PROTONIX) EC tablet 40 mg  40 mg Oral Daily Karmen Bongo, MD   40 mg at 06/14/17 1047  . phenol (CHLORASEPTIC) mouth spray 1 spray  1 spray Mouth/Throat PRN Elgergawy, Silver Huguenin, MD      . polyethylene glycol (MIRALAX / GLYCOLAX) packet  17 g  17 g Oral Daily PRN Karmen Bongo, MD      . sodium chloride flush (NS) 0.9 % injection 3 mL  3 mL Intravenous Q12H Karmen Bongo, MD   3 mL at 06/13/17 2343  . vitamin C (ASCORBIC ACID) tablet 500 mg  500 mg Oral Daily Karmen Bongo, MD   500 mg at 06/14/17 1047     Discharge Medications: Please see discharge summary for a list of discharge medications.  Relevant Imaging Results:  Relevant Lab Results:   Additional Information SS# [id number]. Needs palliative care to follow at facility  [facility], LCSW

## 2017-06-15 DIAGNOSIS — N183 Chronic kidney disease, stage 3 (moderate): Secondary | ICD-10-CM

## 2017-06-15 DIAGNOSIS — J181 Lobar pneumonia, unspecified organism: Secondary | ICD-10-CM

## 2017-06-15 LAB — BASIC METABOLIC PANEL
ANION GAP: 10 (ref 5–15)
BUN: 26 mg/dL — ABNORMAL HIGH (ref 6–20)
CHLORIDE: 100 mmol/L — AB (ref 101–111)
CO2: 33 mmol/L — ABNORMAL HIGH (ref 22–32)
CREATININE: 1.28 mg/dL — AB (ref 0.44–1.00)
Calcium: 8.7 mg/dL — ABNORMAL LOW (ref 8.9–10.3)
GFR calc non Af Amer: 40 mL/min — ABNORMAL LOW (ref >60)
GFR, EST AFRICAN AMERICAN: 46 mL/min — AB (ref >60)
GLUCOSE: 137 mg/dL — AB (ref 65–99)
Potassium: 3.4 mmol/L — ABNORMAL LOW (ref 3.5–5.1)
Sodium: 143 mmol/L (ref 135–145)

## 2017-06-15 LAB — CBC
HEMATOCRIT: 31.5 % — AB (ref 36.0–46.0)
HEMOGLOBIN: 10.2 g/dL — AB (ref 12.0–15.0)
MCH: 32.6 pg (ref 26.0–34.0)
MCHC: 32.4 g/dL (ref 30.0–36.0)
MCV: 100.6 fL — AB (ref 78.0–100.0)
Platelets: 617 10*3/uL — ABNORMAL HIGH (ref 150–400)
RBC: 3.13 MIL/uL — ABNORMAL LOW (ref 3.87–5.11)
RDW: 14.3 % (ref 11.5–15.5)
WBC: 14.9 10*3/uL — ABNORMAL HIGH (ref 4.0–10.5)

## 2017-06-15 LAB — GLUCOSE, CAPILLARY
GLUCOSE-CAPILLARY: 206 mg/dL — AB (ref 65–99)
Glucose-Capillary: 135 mg/dL — ABNORMAL HIGH (ref 65–99)

## 2017-06-15 MED ORDER — BISACODYL 10 MG RE SUPP: 10.0000 mg | Freq: Once | RECTAL | Status: DC

## 2017-06-15 MED ORDER — ENSURE ENLIVE PO LIQD: 237.0000 mL | Freq: Three times a day (TID) | ORAL | 12 refills | Status: DC

## 2017-06-15 MED ORDER — DOCUSATE SODIUM 100 MG PO CAPS: 100.0000 mg | ORAL_CAPSULE | Freq: Two times a day (BID) | ORAL | 0 refills | Status: DC

## 2017-06-15 MED ORDER — POTASSIUM CHLORIDE CRYS ER 20 MEQ PO TBCR
40.0000 meq | EXTENDED_RELEASE_TABLET | Freq: Once | ORAL | Status: AC
  Administered 2017-06-15: 40 meq via ORAL
  Filled 2017-06-15: qty 2

## 2017-06-15 MED ORDER — LEVEMIR FLEXTOUCH 100 UNIT/ML ~~LOC~~ SOPN: PEN_INJECTOR | SUBCUTANEOUS | 2 refills | Status: DC

## 2017-06-15 NOTE — Clinical Social Work Placement (Signed)
Patient received and accepted bed offer at [facility]. PTAR contacted, patient's family notified. Patient's RN can call report to [phone], room [place]. Packet complete.   CLINICAL SOCIAL WORK PLACEMENT  NOTE  Date:  06/15/2017  Patient Details  Name: [name] [name] MRN: [id number] Date of Birth: 05/11/1941  Clinical Social Work is seeking post-discharge placement for this patient at the [place] level of care (*CSW will initial, date and re-position this form in  chart as items are completed):  Yes   Patient/family provided with [place] Work Department's list of facilities offering this level of care within the geographic area requested by the patient (or if unable, by the patient's family).  Yes   Patient/family informed of their freedom to choose among providers that offer the needed level of care, that participate in Medicare, Medicaid or managed care program needed by the patient, have an available bed and are willing to accept the patient.  Yes   Patient/family informed of [facility]'s ownership interest in [facility] and [facility], as well as of the fact that they are under no obligation to receive care at these facilities.  PASRR submitted to EDS on       PASRR number received on       Existing PASRR number confirmed on       FL2 transmitted to all facilities in geographic area requested by pt/family on 06/14/17     FL2 transmitted to all facilities within larger geographic area on       Patient informed that his/her managed care company has contracts with or will negotiate with certain facilities, including the following:        Yes   Patient/family informed of bed offers received.  Patient chooses bed at [facility]     Physician recommends and patient chooses bed at      Patient to be transferred to [facility] on 06/15/17.  Patient to be transferred to facility by PTAR     Patient family  notified on 06/15/17 of transfer.  Name of family member notified:  Virgina Evener     PHYSICIAN       Additional Comment:    _______________________________________________ Burnis Medin, LCSW 06/15/2017, 12:46 PM

## 2017-06-15 NOTE — Care Management Note (Signed)
Case Management Note  Patient Details  Name: [name] [name] MRN: [id number] Date of Birth: 1940-11-08  Subjective/Objective: CSW managing d/c plan SNF-[place].                   Action/Plan:d/c SNF.   Expected Discharge Date:  06/15/17               Expected Discharge Plan:  Skilled Nursing Facility  In-House Referral:  Clinical Social Work  Discharge planning Services  CM Consult  Post Acute Care Choice:   ([facility]-HHRN/PT/OT) Choice offered to:     DME Arranged:    DME Agency:     HH Arranged:    [place] Agency:     Status of Service:  Completed, signed off  If discussed at [employer], dates discussed:    Additional Comments:  Dessa Phi, RN 06/15/2017, 12:17 PM

## 2017-06-15 NOTE — Progress Notes (Signed)
Report called [employer]. Stacey Drain

## 2017-06-15 NOTE — Discharge Summary (Signed)
Physician Discharge Summary  [name] [name] MRN: [id number] DOB/AGE: Oct 05, 1941 76 y.o.  PCP: [name], MD   Admit date: 06/11/2017 Discharge date: 06/15/2017  Discharge Diagnoses:    Principal Problem:   Weakness Active Problems:   DM type 2 (diabetes mellitus, type 2) (HCC)   HTN (hypertension)   CKD (chronic kidney disease) stage 3, GFR 30-59 ml/min   Protein-calorie malnutrition, severe (HCC)   Bacteremia due to Escherichia coli   Partial symptomatic epilepsy with complex partial seizures, not intractable, without status epilepticus ([place])   Sepsis ([place])   Anemia   CAP (community acquired pneumonia)   Encounter for palliative care   Goals of care, counseling/discussion   Pressure injury of skin    Follow-up recommendations Follow-up with PCP in 3-5 days , including all  additional recommended appointments as below Follow-up CBC, CMP in 3-5 days Patient is being discharged to SNF      Current Discharge Medication List    START taking these medications   Details  docusate sodium (COLACE) 100 MG capsule Take 1 capsule (100 mg total) by mouth 2 (two) times daily. Qty: 10 capsule, Refills: 0    feeding supplement, ENSURE ENLIVE, (ENSURE ENLIVE) LIQD Take 237 mLs by mouth 3 (three) times daily between meals. Qty: 237 mL, Refills: 12      CONTINUE these medications which have NOT CHANGED   Details  acetaminophen (TYLENOL) 325 MG tablet Take 2 tablets (650 mg total) by mouth every 6 (six) hours as needed for mild pain, moderate pain, fever or headache.    aspirin 81 MG tablet Take 81 mg by mouth daily.    atorvastatin (LIPITOR) 10 MG tablet Take 10 mg by mouth daily.     divalproex (DEPAKOTE ER) 250 MG 24 hr tablet Take 3 tablets (750 mg total) by mouth daily. Qty: 270 tablet, Refills: 1    LEVEMIR FLEXTOUCH 100 UNIT/ML Pen INJECT 10 UNITS SUBCUTANEOUSLY DAILY AT NIGHT Refills: 2    Magnesium 500 MG TABS Take 1 tablet (500 mg total) by mouth 2 (two) times  daily.    Multiple Vitamin (MULTIVITAMIN) capsule Take 1 capsule by mouth daily.    NOVOLOG FLEXPEN 100 UNIT/ML SOPN FlexPen Inject 4-9 Units into the skin 3 (three) times daily with meals. Per sliding scale, 4 units if over 200 = 5 units in the morning and at lunch and 8 units if over 200 = 9 units.    omeprazole (PRILOSEC) 20 MG capsule Take 20 mg by mouth every morning.  Refills: 5    vitamin C (ASCORBIC ACID) 500 MG tablet Take 500 mg by mouth daily.    cephALEXin (KEFLEX) 500 MG capsule Take 1 capsule (500 mg total) by mouth 2 (two) times daily. Qty: 10 capsule, Refills: 0    diltiazem (CARDIZEM) 60 MG tablet Take 1 tablet (60 mg total) by mouth 2 (two) times daily. Qty: 60 tablet, Refills: 0    hydrOXYzine (ATARAX/VISTARIL) 25 MG tablet TAKE 1 TABLET BY MOUTH AT BEDTIME AS NEEDED FOR ITCHING Refills: 1    metoprolol succinate (TOPROL-XL) 25 MG 24 hr tablet Take 1 tablet (25 mg total) by mouth daily. Qty: 30 tablet, Refills: 0    polyethylene glycol (MIRALAX / GLYCOLAX) packet Take 17 g by mouth daily as needed for moderate constipation. Qty: 14 each, Refills: 0         Discharge Condition: Stable Discharge Instructions Get Medicines reviewed and adjusted: Please take all your medications with you for your next visit  with your Primary MD  Please request your Primary MD to go over all hospital tests and procedure/radiological results at the follow up, please ask your Primary MD to get all Hospital records sent to his/her office.  If you experience worsening of your admission symptoms, develop shortness of breath, life threatening emergency, suicidal or homicidal thoughts you must seek medical attention immediately by calling 911 or calling your MD immediately if symptoms less severe.  You must read complete instructions/literature along with all the possible adverse reactions/side effects for all the Medicines you take and that have been prescribed to you. Take any new  Medicines after you have completely understood and accpet all the possible adverse reactions/side effects.   Do not drive when taking Pain medications.   Do not take more than prescribed Pain, Sleep and Anxiety Medications  Special Instructions: If you have smoked or chewed Tobacco in the last 2 yrs please stop smoking, stop any regular Alcohol and or any Recreational drug use.  Wear Seat belts while driving.  Please note  You were cared for by a hospitalist during your hospital stay. Once you are discharged, your primary care physician will handle any further medical issues. Please note that NO REFILLS for any discharge medications will be authorized once you are discharged, as it is imperative that you return to your primary care physician (or establish a relationship with a primary care physician if you do not have one) for your aftercare needs so that they can reassess your need for medications and monitor your lab values.  Discharge Instructions    Diet - low sodium heart healthy    Complete by:  As directed    Increase activity slowly    Complete by:  As directed        Allergies  Allergen Reactions  . Barbiturates     Becomes restless.  Also with all "strong" medications like sleeping pills  . Latex Itching      Disposition: 01-Home or Self Care   Consults:  Palliative care    Significant Diagnostic Studies:  Dg Chest 1 View  Result Date: 06/11/2017 CLINICAL DATA:  Syncope. EXAM: CHEST 1 VIEW COMPARISON:  06/05/2017 and prior chest radiographs FINDINGS: The cardiomediastinal silhouette is unremarkable. There is no evidence of focal airspace disease, pulmonary edema, suspicious pulmonary nodule/mass, pleural effusion, or pneumothorax. No acute bony abnormalities are identified. IMPRESSION: No active disease. Electronically Signed   By: Margarette Canada M.D.   On: 06/11/2017 20:32   Dg Chest 2 View  Result Date: 06/05/2017 CLINICAL DATA:  Fever, weakness for 2 days.  History of stroke, hypertension, diabetes. EXAM: CHEST  2 VIEW COMPARISON:  Chest x-ray dated 11/15/2016. FINDINGS: Heart size and mediastinal contours are within normal limits. Atherosclerotic changes noted at the aortic arch. Lungs are clear. No pleural effusion or pneumothorax seen. No acute or suspicious osseous finding. Calcifications about both shoulders suggests chronic calcific tendinopathy. IMPRESSION: No active cardiopulmonary disease. No evidence of pneumonia or pulmonary edema. Aortic atherosclerosis. Electronically Signed   By: Franki Cabot M.D.   On: 06/05/2017 23:39   Ct Head Wo Contrast  Result Date: 06/11/2017 CLINICAL DATA:  Syncope. EXAM: CT HEAD WITHOUT CONTRAST TECHNIQUE: Contiguous axial images were obtained from the base of the skull through the vertex without intravenous contrast. COMPARISON:  11/15/2016 FINDINGS: Brain: There is no evidence for acute hemorrhage, hydrocephalus, mass lesion, or abnormal extra-axial fluid collection. No definite CT evidence for acute infarction. Diffuse loss of parenchymal volume is consistent  with atrophy. Patchy low attenuation in the deep hemispheric and periventricular white matter is nonspecific, but likely reflects chronic microvascular ischemic demyelination. Vascular: No hyperdense vessel or unexpected calcification. Skull: No evidence for fracture. No worrisome lytic or sclerotic lesion. Sinuses/Orbits: The visualized paranasal sinuses and mastoid air cells are clear. Visualized portions of the globes and intraorbital fat are unremarkable. Other: As noted on prior studies, there is a soft tissue nodule measuring up to about 18 mm, incompletely visualize, in the posterior left nose, superficial to the medial aspect of the left maxillary sinus. IMPRESSION: 1. Stable.  No acute intracranial abnormality. 2. Atrophy with chronic small vessel white matter ischemic disease. Electronically Signed   By: Misty Stanley M.D.   On: 06/11/2017 20:25   US  Renal  Result Date: 06/06/2017 CLINICAL DATA:  76 year old female with acute renal insufficiency, altered mental status, and fever. EXAM: RENAL / URINARY TRACT ULTRASOUND COMPLETE COMPARISON:  Ultrasound dated 05/12/2017 FINDINGS: Right Kidney: Length: 8.7 cm. There is mild increased echogenicity and parenchymal thinning. There is mild right hydronephrosis. There is a 1.0 x 0.7 x 1.0 cm hypoechoic lesion in the upper pole, likely a complex cyst, similar to prior ultrasound. Left Kidney: Length: 9.4 cm. Mild increased echogenicity. There is parenchymal atrophy and cortical thinning. There is a 4.2 x 3.2 x 2.7 cm inferior pole cyst. No hydronephrosis or echogenic stone. Bladder: Appears normal for degree of bladder distention. IMPRESSION: 1. Mild increased echogenicity and parenchymal atrophy. 2. Mild right hydronephrosis.  No obstructing stone. 3. Left renal cyst.  Probable right renal complex cyst. 4. Unremarkable urinary bladder. Electronically Signed   By: Anner Crete M.D.   On: 06/06/2017 01:26   US Breast Ltd Uni Left Inc Axilla  Result Date: 06/05/2017 CLINICAL DATA:  Recall from screening mammography with tomosynthesis, possible mass or focal asymmetry in the upper inner subareolar left breast. EXAM: 2D DIGITAL DIAGNOSTIC LEFT MAMMOGRAM WITH ADJUNCT TOMO ULTRASOUND LEFT BREAST COMPARISON:  Mammography 06/01/2017, 05/29/2016 and earlier. Left breast ultrasound 03/24/2011 was performed in a different part of the breast. ACR Breast Density Category d: The breast tissue is extremely dense, which lowers the sensitivity of mammography. FINDINGS: Standard and tomosynthesis spot compression CC and MLO views of the area of concern in the left breast were obtained. The spot compression MLO views demonstrates a circumscribed oval medium density mass measuring approximately 6 mm without associated architectural distortion or suspicious calcification. This is not visualized on the spot compression CC view. On  physical exam, there is no palpable abnormality in the upper inner periareolar left breast. Targeted left breast ultrasound is performed, showing a possible intraductal mass at the 10 o'clock position approximately 1 cm from the nipple measuring approximately 3 x 7 x 7 mm. There is no internal power Doppler flow. Sonographic evaluation the left axilla demonstrates no pathologic lymphadenopathy. IMPRESSION: 1. Approximate 7 mm possible intraductal mass in the upper inner subareolar left breast which accounts for the area of concern on screening mammography. 2. No pathologic left axillary lymphadenopathy. RECOMMENDATION: Ultrasound-guided core needle biopsy of the possible intraductal mass in the left breast in order to exclude a high risk papilloma or DCIS. The ultrasound biopsy procedure was discussed with the patient and her questions were answered. She has agreed to proceed and the biopsy has been scheduled for Friday, August 10 at 3:45 p.m. I have discussed the findings and recommendations with the patient. Results were also provided in writing at the conclusion of the visit. BI-RADS CATEGORY  4: Suspicious. Electronically Signed   By: Evangeline Dakin M.D.   On: 06/05/2017 16:37   Mm Diag Breast Tomo Uni Left  Result Date: 06/05/2017 CLINICAL DATA:  Recall from screening mammography with tomosynthesis, possible mass or focal asymmetry in the upper inner subareolar left breast. EXAM: 2D DIGITAL DIAGNOSTIC LEFT MAMMOGRAM WITH ADJUNCT TOMO ULTRASOUND LEFT BREAST COMPARISON:  Mammography 06/01/2017, 05/29/2016 and earlier. Left breast ultrasound 03/24/2011 was performed in a different part of the breast. ACR Breast Density Category d: The breast tissue is extremely dense, which lowers the sensitivity of mammography. FINDINGS: Standard and tomosynthesis spot compression CC and MLO views of the area of concern in the left breast were obtained. The spot compression MLO views demonstrates a circumscribed oval medium  density mass measuring approximately 6 mm without associated architectural distortion or suspicious calcification. This is not visualized on the spot compression CC view. On physical exam, there is no palpable abnormality in the upper inner periareolar left breast. Targeted left breast ultrasound is performed, showing a possible intraductal mass at the 10 o'clock position approximately 1 cm from the nipple measuring approximately 3 x 7 x 7 mm. There is no internal power Doppler flow. Sonographic evaluation the left axilla demonstrates no pathologic lymphadenopathy. IMPRESSION: 1. Approximate 7 mm possible intraductal mass in the upper inner subareolar left breast which accounts for the area of concern on screening mammography. 2. No pathologic left axillary lymphadenopathy. RECOMMENDATION: Ultrasound-guided core needle biopsy of the possible intraductal mass in the left breast in order to exclude a high risk papilloma or DCIS. The ultrasound biopsy procedure was discussed with the patient and her questions were answered. She has agreed to proceed and the biopsy has been scheduled for Friday, August 10 at 3:45 p.m. I have discussed the findings and recommendations with the patient. Results were also provided in writing at the conclusion of the visit. BI-RADS CATEGORY  4: Suspicious. Electronically Signed   By: Evangeline Dakin M.D.   On: 06/05/2017 16:37   Mm Screening Breast Tomo Bilateral  Result Date: 06/02/2017 CLINICAL DATA:  Screening. EXAM: 2D DIGITAL SCREENING BILATERAL MAMMOGRAM WITH CAD AND ADJUNCT TOMO COMPARISON:  Previous exam(s). ACR Breast Density Category c: The breast tissue is heterogeneously dense, which may obscure small masses. FINDINGS: In the left breast, a possible asymmetry warrants further evaluation. In the right breast, no findings suspicious for malignancy. Images were processed with CAD. IMPRESSION: Further evaluation is suggested for possible asymmetry in the left breast.  RECOMMENDATION: Diagnostic mammogram and possibly ultrasound of the left breast. (Code:[id number]-L-64M) The patient will be contacted regarding the findings, and additional imaging will be scheduled. BI-RADS CATEGORY  0: Incomplete. Need additional imaging evaluation and/or prior mammograms for comparison. Electronically Signed   By: Fidela Salisbury M.D.   On: 06/02/2017 11:08        Filed Weights   06/11/17 1921 06/12/17 0144  Weight: 68.5 kg (151 lb) 66 kg (145 lb 9.6 oz)     Microbiology: Recent Results (from the past 240 hour(s))  Culture, blood (Routine x 2)     Status: Abnormal   Collection Time: 06/05/17 10:35 PM  Result Value Ref Range Status   Specimen Description BLOOD RIGHT ANTECUBITAL  Final   Special Requests   Final    BOTTLES DRAWN AEROBIC AND ANAEROBIC Blood Culture adequate volume   Culture  Setup Time   Final    GRAM NEGATIVE RODS IN BOTH AEROBIC AND ANAEROBIC BOTTLES CRITICAL RESULT CALLED TO, READ BACK BY AND  VERIFIED WITH: R RUMBARGER,PHARMD AT 1149 06/06/17 BY L BENFIELD    Culture ESCHERICHIA COLI (A)  Final   Report Status 06/08/2017 FINAL  Final   Organism ID, Bacteria ESCHERICHIA COLI  Final      Susceptibility   Escherichia coli - MIC*    AMPICILLIN 4 SENSITIVE Sensitive     CEFAZOLIN <=4 SENSITIVE Sensitive     CEFEPIME <=1 SENSITIVE Sensitive     CEFTAZIDIME <=1 SENSITIVE Sensitive     CEFTRIAXONE <=1 SENSITIVE Sensitive     CIPROFLOXACIN <=0.25 SENSITIVE Sensitive     GENTAMICIN <=1 SENSITIVE Sensitive     IMIPENEM <=0.25 SENSITIVE Sensitive     TRIMETH/SULFA <=20 SENSITIVE Sensitive     AMPICILLIN/SULBACTAM <=2 SENSITIVE Sensitive     PIP/TAZO <=4 SENSITIVE Sensitive     Extended ESBL NEGATIVE Sensitive     * ESCHERICHIA COLI  Blood Culture ID Panel (Reflexed)     Status: Abnormal   Collection Time: 06/05/17 10:35 PM  Result Value Ref Range Status   Enterococcus species NOT DETECTED NOT DETECTED Final   Listeria monocytogenes NOT DETECTED NOT  DETECTED Final   Staphylococcus species NOT DETECTED NOT DETECTED Final   Staphylococcus aureus NOT DETECTED NOT DETECTED Final   Streptococcus species NOT DETECTED NOT DETECTED Final   Streptococcus agalactiae NOT DETECTED NOT DETECTED Final   Streptococcus pneumoniae NOT DETECTED NOT DETECTED Final   Streptococcus pyogenes NOT DETECTED NOT DETECTED Final   Acinetobacter baumannii NOT DETECTED NOT DETECTED Final   Enterobacteriaceae species DETECTED (A) NOT DETECTED Final    Comment: Enterobacteriaceae represent a large family of gram-negative bacteria, not a single organism. CRITICAL RESULT CALLED TO, READ BACK BY AND VERIFIED WITH: R RUMBARGER,PHARMD AT 1149 06/06/17 BY L BENFIELD    Enterobacter cloacae complex NOT DETECTED NOT DETECTED Final   Escherichia coli DETECTED (A) NOT DETECTED Final    Comment: CRITICAL RESULT CALLED TO, READ BACK BY AND VERIFIED WITH: R RUMBARGER,PHARMD AT 1149 06/06/17 BY L BENFIELD    Klebsiella oxytoca NOT DETECTED NOT DETECTED Final   Klebsiella pneumoniae NOT DETECTED NOT DETECTED Final   Proteus species NOT DETECTED NOT DETECTED Final   Serratia marcescens NOT DETECTED NOT DETECTED Final   Carbapenem resistance NOT DETECTED NOT DETECTED Final   Haemophilus influenzae NOT DETECTED NOT DETECTED Final   Neisseria meningitidis NOT DETECTED NOT DETECTED Final   Pseudomonas aeruginosa NOT DETECTED NOT DETECTED Final   Candida albicans NOT DETECTED NOT DETECTED Final   Candida glabrata NOT DETECTED NOT DETECTED Final   Candida krusei NOT DETECTED NOT DETECTED Final   Candida parapsilosis NOT DETECTED NOT DETECTED Final   Candida tropicalis NOT DETECTED NOT DETECTED Final  Culture, blood (Routine x 2)     Status: None   Collection Time: 06/05/17 10:47 PM  Result Value Ref Range Status   Specimen Description BLOOD LEFT FOREARM  Final   Special Requests IN PEDIATRIC BOTTLE Blood Culture adequate volume  Final   Culture NO GROWTH 5 DAYS  Final   Report  Status 06/10/2017 FINAL  Final  Urine culture     Status: Abnormal   Collection Time: 06/05/17 11:03 PM  Result Value Ref Range Status   Specimen Description URINE, CATHETERIZED  Final   Special Requests NONE  Final   Culture <10,000 COLONIES/mL INSIGNIFICANT GROWTH (A)  Final   Report Status 06/07/2017 FINAL  Final  Blood culture (routine x 2)     Status: None (Preliminary result)   Collection Time: 06/11/17  8:09 PM  Result Value Ref Range Status   Specimen Description BLOOD RIGHT HAND  Final   Special Requests   Final    BOTTLES DRAWN AEROBIC AND ANAEROBIC Blood Culture adequate volume   Culture   Final    NO GROWTH 3 DAYS Performed at [facility], [address], [place], [place] [address]    Report Status PENDING  Incomplete  Blood culture (routine x 2)     Status: None (Preliminary result)   Collection Time: 06/11/17  8:10 PM  Result Value Ref Range Status   Specimen Description BLOOD RIGHT ANTECUBITAL  Final   Special Requests IN PEDIATRIC BOTTLE Blood Culture adequate volume  Final   Culture   Final    NO GROWTH 3 DAYS Performed at [facility], [place] [address] [place], [place] [address]    Report Status PENDING  Incomplete  Urine culture     Status: Abnormal   Collection Time: 06/11/17 10:15 PM  Result Value Ref Range Status   Specimen Description URINE, CLEAN CATCH  Final   Special Requests NONE  Final   Culture (A)  Final    <10,000 COLONIES/mL INSIGNIFICANT GROWTH Performed at [facility], [place] [address], [place], [place] [address]    Report Status 06/13/2017 FINAL  Final       Blood Culture    Component Value Date/Time   SDES URINE, CLEAN CATCH 06/11/2017 2215   SPECREQUEST NONE 06/11/2017 2215   CULT (A) 06/11/2017 2215    <10,000 COLONIES/mL INSIGNIFICANT GROWTH Performed at [place] [address] [place], [place] [address]    REPTSTATUS 06/13/2017 FINAL 06/11/2017 2215      Labs: Results for orders placed or  performed during the hospital encounter of 06/11/17 (from the past 48 hour(s))  Glucose, capillary     Status: Abnormal   Collection Time: 06/13/17  4:21 PM  Result Value Ref Range   Glucose-Capillary 139 (H) 65 - 99 mg/dL  Glucose, capillary     Status: Abnormal   Collection Time: 06/13/17 10:10 PM  Result Value Ref Range   Glucose-Capillary 248 (H) 65 - 99 mg/dL  CBC     Status: Abnormal   Collection Time: 06/14/17  5:22 AM  Result Value Ref Range   WBC 15.9 (H) 4.0 - 10.5 K/uL   RBC 3.07 (L) 3.87 - 5.11 MIL/uL   Hemoglobin 10.0 (L) 12.0 - 15.0 g/dL   HCT 30.8 (L) 36.0 - 46.0 %   MCV 100.3 (H) 78.0 - 100.0 fL   MCH 32.6 26.0 - 34.0 pg   MCHC 32.5 30.0 - 36.0 g/dL   RDW 14.3 11.5 - 15.5 %   Platelets 507 (H) 150 - 400 K/uL  Basic metabolic panel     Status: Abnormal   Collection Time: 06/14/17  5:22 AM  Result Value Ref Range   Sodium 142 135 - 145 mmol/L   Potassium 3.8 3.5 - 5.1 mmol/L    Comment: DELTA CHECK NOTED   Chloride 102 101 - 111 mmol/L   CO2 30 22 - 32 mmol/L   Glucose, Bld 184 (H) 65 - 99 mg/dL   BUN 23 (H) 6 - 20 mg/dL   Creatinine, Ser 1.18 (H) 0.44 - 1.00 mg/dL   Calcium 8.7 (L) 8.9 - 10.3 mg/dL   GFR calc non Af Amer 44 (L) >60 mL/min   GFR calc Af Amer 51 (L) >60 mL/min    Comment: (NOTE) The  eGFR has been calculated using the CKD EPI equation. This calculation has not been validated in all clinical situations. eGFR's persistently <60 mL/min signify possible Chronic Kidney Disease.    Anion gap 10 5 - 15  Glucose, capillary     Status: Abnormal   Collection Time: 06/14/17  7:24 AM  Result Value Ref Range   Glucose-Capillary 117 (H) 65 - 99 mg/dL  Glucose, capillary     Status: Abnormal   Collection Time: 06/14/17 11:32 AM  Result Value Ref Range   Glucose-Capillary 277 (H) 65 - 99 mg/dL  Glucose, capillary     Status: Abnormal   Collection Time: 06/14/17  5:25 PM  Result Value Ref Range   Glucose-Capillary 125 (H) 65 - 99 mg/dL  Glucose,  capillary     Status: Abnormal   Collection Time: 06/14/17  9:01 PM  Result Value Ref Range   Glucose-Capillary 263 (H) 65 - 99 mg/dL  CBC     Status: Abnormal   Collection Time: 06/15/17  5:39 AM  Result Value Ref Range   WBC 14.9 (H) 4.0 - 10.5 K/uL   RBC 3.13 (L) 3.87 - 5.11 MIL/uL   Hemoglobin 10.2 (L) 12.0 - 15.0 g/dL   HCT 31.5 (L) 36.0 - 46.0 %   MCV 100.6 (H) 78.0 - 100.0 fL   MCH 32.6 26.0 - 34.0 pg   MCHC 32.4 30.0 - 36.0 g/dL   RDW 14.3 11.5 - 15.5 %   Platelets 617 (H) 150 - 400 K/uL  Basic metabolic panel     Status: Abnormal   Collection Time: 06/15/17  5:39 AM  Result Value Ref Range   Sodium 143 135 - 145 mmol/L   Potassium 3.4 (L) 3.5 - 5.1 mmol/L   Chloride 100 (L) 101 - 111 mmol/L   CO2 33 (H) 22 - 32 mmol/L   Glucose, Bld 137 (H) 65 - 99 mg/dL   BUN 26 (H) 6 - 20 mg/dL   Creatinine, Ser 1.28 (H) 0.44 - 1.00 mg/dL   Calcium 8.7 (L) 8.9 - 10.3 mg/dL   GFR calc non Af Amer 40 (L) >60 mL/min   GFR calc Af Amer 46 (L) >60 mL/min    Comment: (NOTE) The eGFR has been calculated using the CKD EPI equation. This calculation has not been validated in all clinical situations. eGFR's persistently <60 mL/min signify possible Chronic Kidney Disease.    Anion gap 10 5 - 15  Glucose, capillary     Status: Abnormal   Collection Time: 06/15/17  7:31 AM  Result Value Ref Range   Glucose-Capillary 135 (H) 65 - 99 mg/dL  Glucose, capillary     Status: Abnormal   Collection Time: 06/15/17 12:05 PM  Result Value Ref Range   Glucose-Capillary 206 (H) 65 - 99 mg/dL     Lipid Panel     Component Value Date/Time   CHOL 129 06/29/2013 1419   TRIG 82 06/29/2013 1419   HDL 86 06/29/2013 1419   CHOLHDL 1.5 06/29/2013 1419   VLDL 16 06/29/2013 1419   LDLCALC 27 06/29/2013 1419     Lab Results  Component Value Date   HGBA1C 7.6 (H) 06/07/2017   HGBA1C 7.3 (H) 06/05/2017   HGBA1C 8.6 (H) 06/29/2013        HPI  76 y.o.femalewith medical history significant of  stage III chronic kidney disease, GERD, HLD, HTN, complex partial seizures, 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   HOSPITAL COURSE:   Weakness - Secondary to recent prolonged hospital stay from sepsis,Recommendation was for SNF  discharge, but patient/family had declined, patient did not do well in route to home, readmitted again, PT following  recommendation for SNF placement again and family agreeable.  Escherichia coli bacteremia, source unclear - Patient with 1/2 positive blood cultures during prior hospitalization, pansensitive, she was discharged on Keflex , currently on IV rocephin during hospital stay, can be discharged on oral keflex with SNF bed is available. - Of note, the patient also had E coli bacteremia in 1/18 and so ID consultation was suggested at the time of discharge today. Patient would benefit from outpatient ID evaluation  CKD -Patient with baseline creatinine 1.6-1.7, was 1.8 on admission, continued to trend down, it is 1.28 today, -She did have renal failure during prior hospitalization with creatinine peak at 2.19; renal US 8/4 with mild righthydronephrosis  HTN -Medication changes during hospitalization include reduction in Cardizem and transition from BID Lopressor to Toprol XL  Anemia -Hgb stable from earlier today, but decreased during hospitalization -Will follow  DM -Recent A1c 7.3 -Continue Levemir and SSI  Malnutrition -Patient had SLP evaluation during hospitalization but does not appear to have had nutrition consult - will add  Seizures -Continue Depakote, follow on level  Hypokalemia - Repleted  Hypernatremia -Volume 147 on 8/11, has improved on half-normal saline, it is 143 today,     Discharge Exam:  Blood pressure 129/73, pulse 100,  temperature 98.6 F (37 C), temperature source Oral, resp. rate 18, height '5\' 5"'  (1.651 m), weight 66 kg (145 lb 9.6 oz), SpO2 98 %.  Elderly female, sitting in bed in no apparent distress  Good air entry bilaterally, clear to auscultation, no use of accessory muscles  Regular rate and rhythm, no rubs murmurs gallops  Soft, nontender, nondistended, bowel sounds present  No Cyanosis, Clubbing or edema, No new Rash or bruise      Follow-up Information    [name], MD. Call.   Specialty:  Family Medicine Why:  Hospital follow-up in 3-5 days Contact information: [address] [place] [address] [phone]           Signed: Reyne Dumas 06/15/2017, 12:08 PM        Time spent >1 hour

## 2017-06-16 ENCOUNTER — Other Ambulatory Visit: Payer: Self-pay | Admitting: Neurology

## 2017-06-16 LAB — CULTURE, BLOOD (ROUTINE X 2)
CULTURE: NO GROWTH
Culture: NO GROWTH
Special Requests: ADEQUATE
Special Requests: ADEQUATE

## 2017-07-27 ENCOUNTER — Inpatient Hospital Stay (HOSPITAL_COMMUNITY)
Admission: EM | Admit: 2017-07-27 | Discharge: 2017-07-30 | DRG: 470 | Disposition: A | Discharge status: Skilled Nursing Facility | Payer: Medicare Other | Attending: Family Medicine | Admitting: Family Medicine

## 2017-07-27 ENCOUNTER — Emergency Department (HOSPITAL_COMMUNITY): Payer: Medicare Other

## 2017-07-27 ENCOUNTER — Encounter (HOSPITAL_COMMUNITY): Payer: Self-pay | Admitting: Emergency Medicine

## 2017-07-27 DIAGNOSIS — I129 Hypertensive chronic kidney disease with stage 1 through stage 4 chronic kidney disease, or unspecified chronic kidney disease: Secondary | ICD-10-CM | POA: Diagnosis present

## 2017-07-27 DIAGNOSIS — Z419 Encounter for procedure for purposes other than remedying health state, unspecified: Secondary | ICD-10-CM

## 2017-07-27 DIAGNOSIS — G8194 Hemiplegia, unspecified affecting left nondominant side: Secondary | ICD-10-CM | POA: Diagnosis not present

## 2017-07-27 DIAGNOSIS — E1122 Type 2 diabetes mellitus with diabetic chronic kidney disease: Secondary | ICD-10-CM | POA: Diagnosis present

## 2017-07-27 DIAGNOSIS — W19XXXA Unspecified fall, initial encounter: Secondary | ICD-10-CM

## 2017-07-27 DIAGNOSIS — N183 Chronic kidney disease, stage 3 unspecified: Secondary | ICD-10-CM | POA: Diagnosis present

## 2017-07-27 DIAGNOSIS — I10 Essential (primary) hypertension: Secondary | ICD-10-CM | POA: Diagnosis present

## 2017-07-27 DIAGNOSIS — I69954 Hemiplegia and hemiparesis following unspecified cerebrovascular disease affecting left non-dominant side: Secondary | ICD-10-CM | POA: Diagnosis not present

## 2017-07-27 DIAGNOSIS — S72001A Fracture of unspecified part of neck of right femur, initial encounter for closed fracture: Secondary | ICD-10-CM | POA: Diagnosis present

## 2017-07-27 DIAGNOSIS — W010XXA Fall on same level from slipping, tripping and stumbling without subsequent striking against object, initial encounter: Secondary | ICD-10-CM | POA: Diagnosis present

## 2017-07-27 DIAGNOSIS — M12551 Traumatic arthropathy, right hip: Secondary | ICD-10-CM | POA: Diagnosis not present

## 2017-07-27 DIAGNOSIS — D62 Acute posthemorrhagic anemia: Secondary | ICD-10-CM | POA: Diagnosis not present

## 2017-07-27 DIAGNOSIS — Z7982 Long term (current) use of aspirin: Secondary | ICD-10-CM | POA: Diagnosis not present

## 2017-07-27 DIAGNOSIS — E1169 Type 2 diabetes mellitus with other specified complication: Secondary | ICD-10-CM | POA: Diagnosis not present

## 2017-07-27 DIAGNOSIS — K219 Gastro-esophageal reflux disease without esophagitis: Secondary | ICD-10-CM | POA: Diagnosis present

## 2017-07-27 DIAGNOSIS — E785 Hyperlipidemia, unspecified: Secondary | ICD-10-CM | POA: Diagnosis present

## 2017-07-27 DIAGNOSIS — G40409 Other generalized epilepsy and epileptic syndromes, not intractable, without status epilepticus: Secondary | ICD-10-CM | POA: Diagnosis present

## 2017-07-27 DIAGNOSIS — I69919 Unspecified symptoms and signs involving cognitive functions following unspecified cerebrovascular disease: Secondary | ICD-10-CM | POA: Diagnosis present

## 2017-07-27 DIAGNOSIS — Z79899 Other long term (current) drug therapy: Secondary | ICD-10-CM | POA: Diagnosis not present

## 2017-07-27 DIAGNOSIS — E46 Unspecified protein-calorie malnutrition: Secondary | ICD-10-CM | POA: Diagnosis present

## 2017-07-27 DIAGNOSIS — Z794 Long term (current) use of insulin: Secondary | ICD-10-CM

## 2017-07-27 DIAGNOSIS — R531 Weakness: Secondary | ICD-10-CM

## 2017-07-27 DIAGNOSIS — Y92009 Unspecified place in unspecified non-institutional (private) residence as the place of occurrence of the external cause: Secondary | ICD-10-CM | POA: Diagnosis not present

## 2017-07-27 DIAGNOSIS — N289 Disorder of kidney and ureter, unspecified: Secondary | ICD-10-CM | POA: Diagnosis not present

## 2017-07-27 DIAGNOSIS — S72009A Fracture of unspecified part of neck of unspecified femur, initial encounter for closed fracture: Secondary | ICD-10-CM | POA: Diagnosis present

## 2017-07-27 DIAGNOSIS — E118 Type 2 diabetes mellitus with unspecified complications: Secondary | ICD-10-CM

## 2017-07-27 LAB — CBC WITH DIFFERENTIAL/PLATELET
Basophils Absolute: 0 10*3/uL (ref 0.0–0.1)
Basophils Relative: 0 %
Eosinophils Absolute: 0.1 10*3/uL (ref 0.0–0.7)
Eosinophils Relative: 1 %
HEMATOCRIT: 33.9 % — AB (ref 36.0–46.0)
HEMOGLOBIN: 11 g/dL — AB (ref 12.0–15.0)
LYMPHS ABS: 3.6 10*3/uL (ref 0.7–4.0)
Lymphocytes Relative: 31 %
MCH: 32.9 pg (ref 26.0–34.0)
MCHC: 32.4 g/dL (ref 30.0–36.0)
MCV: 101.5 fL — ABNORMAL HIGH (ref 78.0–100.0)
MONOS PCT: 10 %
Monocytes Absolute: 1.2 10*3/uL — ABNORMAL HIGH (ref 0.1–1.0)
NEUTROS ABS: 6.5 10*3/uL (ref 1.7–7.7)
NEUTROS PCT: 58 %
Platelets: 255 10*3/uL (ref 150–400)
RBC: 3.34 MIL/uL — AB (ref 3.87–5.11)
RDW: 14.9 % (ref 11.5–15.5)
WBC: 11.4 10*3/uL — AB (ref 4.0–10.5)

## 2017-07-27 LAB — PROTIME-INR
INR: 0.9
Prothrombin Time: 12.1 seconds (ref 11.4–15.2)

## 2017-07-27 LAB — URINALYSIS, ROUTINE W REFLEX MICROSCOPIC
BILIRUBIN URINE: NEGATIVE
GLUCOSE, UA: NEGATIVE mg/dL
HGB URINE DIPSTICK: NEGATIVE
KETONES UR: 5 mg/dL — AB
Leukocytes, UA: NEGATIVE
Nitrite: NEGATIVE
Protein, ur: NEGATIVE mg/dL
Specific Gravity, Urine: 1.009 (ref 1.005–1.030)
pH: 7 (ref 5.0–8.0)

## 2017-07-27 LAB — BASIC METABOLIC PANEL
ANION GAP: 9 (ref 5–15)
BUN: 23 mg/dL — ABNORMAL HIGH (ref 6–20)
CHLORIDE: 100 mmol/L — AB (ref 101–111)
CO2: 30 mmol/L (ref 22–32)
Calcium: 8.9 mg/dL (ref 8.9–10.3)
Creatinine, Ser: 1.41 mg/dL — ABNORMAL HIGH (ref 0.44–1.00)
GFR calc non Af Amer: 35 mL/min — ABNORMAL LOW (ref >60)
GFR, EST AFRICAN AMERICAN: 41 mL/min — AB (ref >60)
Glucose, Bld: 137 mg/dL — ABNORMAL HIGH (ref 65–99)
POTASSIUM: 4.6 mmol/L (ref 3.5–5.1)
Sodium: 139 mmol/L (ref 135–145)

## 2017-07-27 LAB — VALPROIC ACID LEVEL: VALPROIC ACID LVL: 21 ug/mL — AB (ref 50.0–100.0)

## 2017-07-27 MED ORDER — HYDROCODONE-ACETAMINOPHEN 5-325 MG PO TABS
1.0000 | ORAL_TABLET | Freq: Four times a day (QID) | ORAL | Status: DC | PRN
  Administered 2017-07-28: 2 via ORAL
  Filled 2017-07-27: qty 2

## 2017-07-27 MED ORDER — POLYETHYLENE GLYCOL 3350 17 G PO PACK: 17.0000 g | PACK | Freq: Every day | ORAL | Status: DC | PRN

## 2017-07-27 MED ORDER — ONDANSETRON HCL 4 MG/2ML IJ SOLN
4.0000 mg | Freq: Once | INTRAMUSCULAR | Status: AC
  Administered 2017-07-27: 4 mg via INTRAVENOUS
  Filled 2017-07-27: qty 2

## 2017-07-27 MED ORDER — MORPHINE SULFATE (PF) 2 MG/ML IV SOLN
0.5000 mg | INTRAVENOUS | Status: DC | PRN
  Administered 2017-07-27 (×3): 0.5 mg via INTRAVENOUS
  Filled 2017-07-27 (×3): qty 1

## 2017-07-27 MED ORDER — ENOXAPARIN SODIUM 30 MG/0.3ML ~~LOC~~ SOLN: 30.0000 mg | SUBCUTANEOUS | Status: DC

## 2017-07-27 MED ORDER — SODIUM CHLORIDE 0.9 % IV BOLUS (SEPSIS)
500.0000 mL | Freq: Once | INTRAVENOUS | Status: AC
  Administered 2017-07-27: 500 mL via INTRAVENOUS

## 2017-07-27 MED ORDER — MORPHINE SULFATE (PF) 4 MG/ML IV SOLN
4.0000 mg | Freq: Once | INTRAVENOUS | Status: AC
  Administered 2017-07-27: 4 mg via INTRAVENOUS
  Filled 2017-07-27: qty 1

## 2017-07-27 NOTE — Progress Notes (Signed)
Patient ID: [name] [name], female   DOB: 1941-09-17, 76 y.o.   MRN: [id number] I have seen the patient's hip xrays.  She has a displaced right hip femoral neck fracture.  This will require a right hip replacement.  I will need to have her transferred to [facility] for surgery due to OR availability.  She will need a right hip replacement.  I will see her later today.  She can eat today, because surgery will be planned for tomorrow 9/25.

## 2017-07-27 NOTE — ED Notes (Signed)
Bed: WL79 Expected date:  Expected time:  Means of arrival:  Comments: EMS or from [facility]

## 2017-07-27 NOTE — Consult Note (Signed)
Reason for Consult:  Right hip fracture Referring Physician: Dr. Stark Jock, EDP  [name] [name] is an 76 y.o. female.  HPI: The patient is a 76 year old elderly female who was recently discharged from the hospital due to treatment for sepsis. She sustained a minimal accidental fall landing on her right hip in the early morning hours. She is transported to the [facility] emergency room with severe hip pain on the right side with inability ambulate. She denies any loss of consciousness. In the emergency room she is found to have a displaced femoral neck fracture right hip. Orthopedic surgery was consult to address the hip fracture. She is going to be admitted to the medicine service due to her comorbidities and clearance for surgery. Her husband is at the bedside with her and she does report severe right hip pain.  Past Medical History:  Diagnosis Date  . Anemia   . Arthritis    "a touch in my hands" (11/17/2016)  . Chronic back pain   . Chronic kidney disease (CKD), active medical management without dialysis, stage 3 (moderate)   . Depression   . GERD (gastroesophageal reflux disease)   . High cholesterol   . Hyperlipidemia   . Hypertension   . Seizures ([place])    last seizure 2014 (11/17/2016)  . Stroke [facility]) 2013   Secondary to acute right posterior temporo-occipital intra-axial hemorrhage  . Type II diabetes mellitus ([employer])     Past Surgical History:  Procedure Laterality Date  . CATARACT EXTRACTION W/ INTRAOCULAR LENS IMPLANT Bilateral   . COLONOSCOPY    . DILATION AND CURETTAGE OF UTERUS  X 3  . 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 Cancer Mother   . Colon cancer Mother   . Liver disease Mother   . Breast cancer Mother   . Cancer Father   . Diabetes Paternal Grandmother   . Breast cancer  Maternal Aunt   . Breast cancer Maternal Aunt   . Esophageal cancer Neg Hx   . Rectal cancer Neg Hx   . Stomach cancer Neg Hx     Social History:  reports that she has never smoked. She has never used smokeless tobacco. She reports that she does not drink alcohol or use drugs.  Allergies:  Allergies  Allergen Reactions  . Barbiturates     Becomes restless.  Also with all "strong" medications like sleeping pills  . Latex Itching    Medications: I have reviewed the patient's current medications.  Results for orders placed or performed during the hospital encounter of 07/27/17 (from the past 48 hour(s))  Basic metabolic panel     Status: Abnormal   Collection Time: 07/27/17  5:50 AM  Result Value Ref Range   Sodium 139 135 - 145 mmol/L   Potassium 4.6 3.5 - 5.1 mmol/L   Chloride 100 (L) 101 - 111 mmol/L   CO2 30 22 - 32 mmol/L   Glucose, Bld 137 (H) 65 - 99 mg/dL   BUN 23 (H) 6 - 20 mg/dL   Creatinine, Ser 1.41 (H) 0.44 - 1.00 mg/dL   Calcium 8.9 8.9 - 10.3 mg/dL   GFR calc non Af Amer 35 (L) >60 mL/min   GFR calc Af Amer 41 (L) >60 mL/min    Comment: (NOTE) The eGFR  has been calculated using the CKD EPI equation. This calculation has not been validated in all clinical situations. eGFR's persistently <60 mL/min signify possible Chronic Kidney Disease.    Anion gap 9 5 - 15  CBC with Differential     Status: Abnormal   Collection Time: 07/27/17  5:50 AM  Result Value Ref Range   WBC 11.4 (H) 4.0 - 10.5 K/uL   RBC 3.34 (L) 3.87 - 5.11 MIL/uL   Hemoglobin 11.0 (L) 12.0 - 15.0 g/dL   HCT 33.9 (L) 36.0 - 46.0 %   MCV 101.5 (H) 78.0 - 100.0 fL   MCH 32.9 26.0 - 34.0 pg   MCHC 32.4 30.0 - 36.0 g/dL   RDW 14.9 11.5 - 15.5 %   Platelets 255 150 - 400 K/uL   Neutrophils Relative % 58 %   Neutro Abs 6.5 1.7 - 7.7 K/uL   Lymphocytes Relative 31 %   Lymphs Abs 3.6 0.7 - 4.0 K/uL   Monocytes Relative 10 %   Monocytes Absolute 1.2 (H) 0.1 - 1.0 K/uL   Eosinophils Relative 1 %    Eosinophils Absolute 0.1 0.0 - 0.7 K/uL   Basophils Relative 0 %   Basophils Absolute 0.0 0.0 - 0.1 K/uL  Protime-INR     Status: None   Collection Time: 07/27/17  5:50 AM  Result Value Ref Range   Prothrombin Time 12.1 11.4 - 15.2 seconds   INR 0.90   Urinalysis, Routine w reflex microscopic     Status: Abnormal   Collection Time: 07/27/17  8:29 AM  Result Value Ref Range   Color, Urine STRAW (A) YELLOW   APPearance CLEAR CLEAR   Specific Gravity, Urine 1.009 1.005 - 1.030   pH 7.0 5.0 - 8.0   Glucose, UA NEGATIVE NEGATIVE mg/dL   Hgb urine dipstick NEGATIVE NEGATIVE   Bilirubin Urine NEGATIVE NEGATIVE   Ketones, ur 5 (A) NEGATIVE mg/dL   Protein, ur NEGATIVE NEGATIVE mg/dL   Nitrite NEGATIVE NEGATIVE   Leukocytes, UA NEGATIVE NEGATIVE    Dg Chest 1 View  Result Date: 07/27/2017 CLINICAL DATA:  Fall tonight with right hip fracture. EXAM: CHEST 1 VIEW COMPARISON:  Radiograph 06/11/2017 FINDINGS: The cardiomediastinal contours are normal. Mild biapical pleuroparenchymal scarring. Pulmonary vasculature is normal. No consolidation, pleural effusion, or pneumothorax. No acute osseous abnormalities are seen. IMPRESSION: No acute abnormality. Electronically Signed   By: Jeb Levering M.D.   On: 07/27/2017 06:16   Dg Hip Unilat W Or Wo Pelvis 2-3 Views Right  Result Date: 07/27/2017 CLINICAL DATA:  Fall tonight with right hip pain. EXAM: DG HIP (WITH OR WITHOUT PELVIS) 2-3V RIGHT COMPARISON:  None. FINDINGS: Displaced right femoral neck fracture with minimal proximal migration of the femoral shaft. Femoral head remains seated. Remainder the bony pelvis is intact without additional fracture. Pubic symphysis and sacroiliac joints are congruent. IMPRESSION: Mildly displaced right femoral neck fracture. Electronically Signed   By: Jeb Levering M.D.   On: 07/27/2017 06:14    ROS Blood pressure (!) 141/70, pulse (!) 118, temperature 97.6 F (36.4 C), temperature source Oral, resp.  rate 20, SpO2 100 %. Physical Exam  Constitutional: She is oriented to person, place, and time. She appears well-developed and well-nourished.  HENT:  Head: Normocephalic and atraumatic.  Eyes: Pupils are equal, round, and reactive to light.  Neck: Normal range of motion.  Cardiovascular: Tachycardia present.   Respiratory: Effort normal.  GI: Soft.  Musculoskeletal:       Right hip:  She exhibits decreased range of motion, decreased strength, tenderness, bony tenderness and deformity.  Neurological: She is alert and oriented to person, place, and time.  Psychiatric: She has a normal mood and affect.    Assessment/Plan: Displaced right hip femoral neck fracture  I have talked to her and her husband in length and they understand that we are recommending a hip replacement to address her right hip fracture. They understands the goals are decreased pain, improve mobility, and improved quality of life. They understand that there is significant risk of nerve and vessel injury, fracture, infection, DVT and dislocation. Given her comorbidities all these are heightened risks. Today we will let her eat because we cannot set her up for surgery until tomorrow afternoon given OR availability. We will try to have her transferred also to [facility] due to surgery needing to be over there due to our surgery schedule.  Mcarthur Rossetti 07/27/2017, 12:59 PM

## 2017-07-27 NOTE — ED Triage Notes (Signed)
Brought in by EMS from home with c/o right hip pain after her witnessed fall this morning.  Pt was trying to use her bedside commode when she lost balance and fell on her right side.  Pt's fall was witnessed by spouse.  Pt did not hit head nor lost consciousness.  Pt was given Fentanyl 100 mcg IV on scene.  Per EMS, pt was noted to have external rotation with shortening on her right leg---- pelvic binder/splint applied by EMS.

## 2017-07-27 NOTE — ED Provider Notes (Signed)
[facility] DEPT Provider Note   CSN: [id number] Arrival date & time: 07/27/17  0509     History   Chief Complaint Chief Complaint  Patient presents with  . Fall  . Hip Injury    HPI [name] [name] is a 76 y.o. female.  Patient is a 76 year old female with history of hypertension, diabetes, chronic renal insufficiency, prior CVA, and recent admission for UTI/urosepsis. She presents by EMS for evaluation after a fall. She got up in the night to go to the bathroom where she tripped and fell, injuring her right hip. She was unable to ambulate and required antibiotics transport. She denies any other injury in the fall.   The history is provided by the patient.  Fall  This is a new problem. The current episode started less than 1 hour ago. The problem occurs constantly. The problem has not changed since onset.Pertinent negatives include no chest pain, no headaches and no shortness of breath. Exacerbated by: movement and position. Nothing relieves the symptoms. She has tried nothing for the symptoms.    Past Medical History:  Diagnosis Date  . Anemia   . Arthritis    "a touch in my hands" (11/17/2016)  . Chronic back pain   . Chronic kidney disease (CKD), active medical management without dialysis, stage 3 (moderate)   . Depression   . GERD (gastroesophageal reflux disease)   . High cholesterol   . Hyperlipidemia   . Hypertension   . Seizures ([place])    last seizure 2014 (11/17/2016)  . Stroke [facility]) 2013   Secondary to acute right posterior temporo-occipital intra-axial hemorrhage  . Type II diabetes mellitus [facility])     Patient Active Problem List   Diagnosis Date Noted  . Pressure injury of skin 06/14/2017  . Encounter for palliative care   . Goals of care, counseling/discussion   . CAP (community acquired pneumonia) 06/12/2017  . Sepsis ([place]) 06/11/2017  . Weakness 06/11/2017  . Anemia 06/11/2017  . AKI (acute kidney injury) ([place]) 06/05/2017  . Sepsis, unspecified  organism ([place]) 06/05/2017  . Partial symptomatic epilepsy with complex partial seizures, not intractable, without status epilepticus ([place])   . Bacteremia due to Escherichia coli 11/18/2016  . Renal insufficiency   . Aortic atherosclerosis ([place]) 11/15/2016  . UTI (urinary tract infection) 11/15/2016  . Paranoia ([place]) 05/24/2015  . Tachycardia 07/02/2013  . Hypophosphatemia 07/02/2013  . Encounter for central line placement 07/02/2013  . Hypokalemia 07/01/2013  . Hypomagnesemia 07/01/2013  . Protein-calorie malnutrition, severe ([place]) 06/29/2013  . Leukocytosis, unspecified 06/29/2013  . 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 TIA (transient ischemic attack) 12/13/2012  . 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 Syncope 09/08/2012  . Acute respiratory failure with hypoxia ([place]) 08/20/2012  . Seizure disorder, grand mal ([place]) 08/20/2012  . Seizure ([place]) 08/20/2012  . CKD (chronic kidney disease) stage 3, GFR 30-59 ml/min 08/19/2012  . Leukocytosis 08/19/2012  . Hemorrhage in the brain-13 x 22 x 14 mm right posterior temporo-occipital intra-axial acute 08/19/2012  . Left hip pain 08/19/2012  . Left hemiparesis ([place]) 08/19/2012  . Hemianopia, homonymous, left 08/19/2012  . Hemisensory deficit, left 08/19/2012  . Nausea 08/19/2012  . Headache(784.0) 08/19/2012  . Acute encephalopathy 08/19/2012  . Hemorrhage of brain, nontraumatic ([place]) 08/18/2012  . Dysphagia 08/18/2012  . HTN (hypertension) 08/18/2012  . DM type 2 (diabetes mellitus, type 2) ([place]) 07/26/2012  . Hypoglycemia due to insulin 07/26/2012  . GERD (gastroesophageal reflux disease)  Past Surgical History:  Procedure Laterality Date  . CATARACT EXTRACTION W/ INTRAOCULAR LENS IMPLANT Bilateral   . COLONOSCOPY    . DILATION AND CURETTAGE OF UTERUS  X 3  . 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 Authorizing Provider  acetaminophen (TYLENOL) 325 MG tablet Take 2 tablets (650 mg total) by mouth every 6 (six) hours as needed for mild pain, moderate pain, fever or headache. 06/11/17   Hongalgi, Lenis Dickinson, MD  aspirin 81 MG tablet Take 81 mg by mouth daily.    [provider]  atorvastatin (LIPITOR) 10 MG tablet Take 10 mg by mouth daily.  02/11/13   [provider]  cephALEXin (KEFLEX) 500 MG capsule Take 1 capsule (500 mg total) by mouth 2 (two) times daily. 06/11/17   Hongalgi, Lenis Dickinson, MD  diltiazem (CARDIZEM) 60 MG tablet Take 1 tablet (60 mg total) by mouth 2 (two) times daily. 06/11/17   Hongalgi, Lenis Dickinson, MD  divalproex (DEPAKOTE ER) 250 MG 24 hr tablet TAKE 3 TABLETS (750 MG TOTAL) BY MOUTH DAILY. 06/16/17   Garvin Fila, MD  docusate sodium (COLACE) 100 MG capsule Take 1 capsule (100 mg total) by mouth 2 (two) times daily. 06/15/17   Reyne Dumas, MD  feeding supplement, ENSURE ENLIVE, (ENSURE ENLIVE) LIQD Take 237 mLs by mouth 3 (three) times daily between meals. 06/15/17   Reyne Dumas, MD  hydrOXYzine (ATARAX/VISTARIL) 25 MG tablet TAKE 1 TABLET BY MOUTH AT BEDTIME AS NEEDED FOR ITCHING 04/25/15   [provider]  LEVEMIR FLEXTOUCH 100 UNIT/ML Pen INJECT 10 UNITS SUBCUTANEOUSLY DAILY AT NIGHT 06/15/17   Reyne Dumas, MD  Magnesium 500 MG TABS Take 1 tablet (500 mg total) by mouth 2 (two) times daily. 06/11/17   Hongalgi, Lenis Dickinson, MD  metoprolol succinate (TOPROL-XL) 25 MG 24 hr tablet Take 1 tablet (25 mg total) by mouth daily. 06/12/17   Hongalgi, Lenis Dickinson, MD  Multiple Vitamin (MULTIVITAMIN) capsule Take 1 capsule by mouth daily.    [provider]  NOVOLOG FLEXPEN 100 UNIT/ML SOPN FlexPen Inject 4-9 Units into the skin 3 (three) times daily with meals. Per sliding scale, 4 units  if over 200 = 5 units in the morning and at lunch and 8 units if over 200 = 9 units. 07/29/13   [provider]  omeprazole (PRILOSEC) 20 MG capsule Take 20 mg by mouth every morning.  03/17/15   [provider]  polyethylene glycol (MIRALAX / GLYCOLAX) packet Take 17 g by mouth daily as needed for moderate constipation. 06/11/17   Hongalgi, Lenis Dickinson, MD  vitamin C (ASCORBIC ACID) 500 MG tablet Take 500 mg by mouth daily.    [provider]    Family History Family History  Problem Relation Age of Onset  . Cancer Mother   . Colon cancer Mother   . Liver disease Mother   . Breast cancer Mother   . Cancer Father   . Diabetes Paternal Grandmother   . Breast cancer Maternal Aunt   . Breast cancer Maternal Aunt   . Esophageal cancer Neg Hx   . Rectal cancer Neg Hx   .  Stomach cancer Neg Hx     Social History Social History  Substance Use Topics  . Smoking status: Never Smoker  . Smokeless tobacco: Never Used  . Alcohol use No     Comment: patient drinks caffeinated drinks.      Allergies   Barbiturates and Latex   Review of Systems Review of Systems  Respiratory: Negative for shortness of breath.   Cardiovascular: Negative for chest pain.  Neurological: Negative for headaches.  All other systems reviewed and are negative.    Physical Exam Updated Vital Signs BP (!) 154/92 (BP Location: Left Arm)   Pulse (!) 110   Temp 97.6 F (36.4 C) (Oral)   Resp 20   SpO2 97%   Physical Exam  Constitutional: She is oriented to person, place, and time. She appears well-developed and well-nourished. No distress.  Patient is a 76 year old female in no acute distress. She appears somewhat tremulous and anxious.  HENT:  Head: Normocephalic and atraumatic.  Neck: Normal range of motion. Neck supple.  Cardiovascular: Normal rate and regular rhythm.  Exam reveals no gallop and no friction rub.   No murmur heard. Pulmonary/Chest: Effort normal and breath sounds  normal. No respiratory distress. She has no wheezes.  Abdominal: Soft. Bowel sounds are normal. She exhibits no distension. There is no tenderness.  Musculoskeletal: Normal range of motion.  There is tenderness to palpation over the lateral aspect of the right hip. There is pain with any range of motion. There does appear to be slight external rotation of the right leg. DP pulses are palpable bilaterally. Motor and sensation is intact to both feet.  Neurological: She is alert and oriented to person, place, and time.  Skin: Skin is warm and dry. She is not diaphoretic.  Nursing note and vitals reviewed.    ED Treatments / Results  Labs (all labs ordered are listed, but only abnormal results are displayed) Labs Reviewed  BASIC METABOLIC PANEL  CBC WITH DIFFERENTIAL/PLATELET  PROTIME-INR    EKG  EKG Interpretation  Date/Time:  Monday July 27 2017 05:21:52 EDT Ventricular Rate:  111 PR Interval:    QRS Duration: 126 QT Interval:  352 QTC Calculation: 479 R Axis:   69 Text Interpretation:  Sinus tachycardia Probable left atrial enlargement IVCD, consider atypical RBBB Baseline wander in lead(s) III no significant change from 06/11/2017 Confirmed by Veryl Speak [phone]) on 07/27/2017 5:29:49 AM       Radiology No results found.  Procedures Procedures (including critical care time)  Medications Ordered in ED Medications - No data to display   Initial Impression / Assessment and Plan / ED Course  I have reviewed the triage vital signs and the nursing notes.  Pertinent labs & imaging results that were available during my care of the patient were reviewed by me and considered in my medical decision making (see chart for details).  Patient presents with hip pain after a fall. She has a mildly displaced femoral neck fracture of the right hip. I have discussed this with Dr. Ninfa Linden from orthopedic surgery. He plans to operate this afternoon, but would like this to take place  at [facility] where he has operating time this afternoon.  Laboratory studies are otherwise unremarkable. Patient will be admitted to the hospitalist service at [facility].  Final Clinical Impressions(s) / ED Diagnoses   Final diagnoses:  None    New Prescriptions New Prescriptions   No medications on file     Veryl Speak, MD 07/27/17  0710  

## 2017-07-27 NOTE — ED Notes (Signed)
Report Given to [employer] nurse, [facility] called for transport but delay in pick up expected due to call census.

## 2017-07-27 NOTE — H&P (Addendum)
[facility] History and Physical  [name] [name] IPJ:[id number] DOB: [birth year] DOA: 07/27/2017  Referring physician:   PCP: [name], MD   Chief Complaint:   Fall   . Hip Injury     HPI:   76 y.o. 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 The patient has been home for 10 days and was doing fairly well. According to the husband she had a bedside commode. She was trying to transfer to the bedside commode and her leg caught on the edge of the commode and she fell on her right side. She did not hit her head. ED course BP (!) 154/92 (BP Location: Left Arm)   Pulse (!) 110   Temp 97.6 F (36.4 C) (Oral)   Resp 20   SpO2 97%  Creatinine 1.41, white blood cell count 11.4, hemoglobin 11.0, Patient has received morphine and is still complaining of pain EDP discussed with Dr. Ninfa Linden from orthopedic surgery. He plans to operate this afternoon, but would like this to take place at : negative for the following  Constitutional: Denies fever, chills, diaphoresis, appetite change and fatigue.  HEENT: Denies photophobia, eye pain, redness, hearing loss, ear pain, congestion, sore throat, rhinorrhea, sneezing, mouth sores, trouble swallowing, neck pain, neck stiffness and tinnitus.  Respiratory: Denies SOB, DOE, cough, chest tightness, and wheezing.  Cardiovascular: Denies chest pain, palpitations and leg swelling.  Gastrointestinal: Denies nausea, vomiting, abdominal pain, diarrhea, constipation, blood in stool and abdominal distention.   Genitourinary: Denies dysuria, urgency, frequency, hematuria, flank pain and difficulty urinating.  Musculoskeletal: Denies myalgias, back pain, positive for right hip joint swelling, arthralgias and gait problem.  Skin: Denies pallor, rash and wound.  Neurological: Denies dizziness, seizures, syncope, weakness, light-headedness, numbness and headaches.  Hematological: Denies adenopathy. Easy bruising, personal or family bleeding history  Psychiatric/Behavioral: Denies suicidal ideation, mood changes, confusion, nervousness, sleep disturbance and agitation       Past Medical History:  Diagnosis Date  . Anemia   . Arthritis    "a touch in my hands" (11/17/2016)  . Chronic back pain   . Chronic kidney disease (CKD), active medical management without dialysis, stage 3 (moderate)   . Depression   . GERD (gastroesophageal reflux disease)   . High cholesterol   . Hyperlipidemia   . Hypertension   . Seizures ([place])    last seizure 2014 (11/17/2016)  . Stroke [facility]) 2013   Secondary to acute right posterior temporo-occipital intra-axial hemorrhage  . Type II diabetes mellitus ([place])      Past Surgical History:  Procedure Laterality Date  . CATARACT EXTRACTION W/ INTRAOCULAR LENS IMPLANT Bilateral   . COLONOSCOPY    . DILATION AND CURETTAGE OF UTERUS  X 3  . 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 She has never used smokeless tobacco. She reports that she does not drink alcohol or use drugs.  Allergies  Allergen Reactions  . Barbiturates     Becomes restless.  Also with all "strong" medications like sleeping pills  . Latex Itching    Family History  Problem Relation Age of Onset  . Cancer Mother   . Colon cancer Mother   . Liver disease Mother   . Breast cancer Mother   .  Cancer Father   . Diabetes Paternal Grandmother   . Breast cancer Maternal Aunt   . Breast cancer Maternal Aunt   . Esophageal cancer Neg Hx   . Rectal cancer Neg Hx   . Stomach cancer Neg Hx        Prior to Admission medications   Medication Sig Start Date End Date Taking? Authorizing Provider  acetaminophen (TYLENOL) 325 MG tablet Take 2 tablets (650 mg total) by mouth every 6 (six) hours as needed for mild pain, moderate pain, fever or headache. 06/11/17  Yes Hongalgi, Lenis Dickinson, MD  aspirin 81 MG tablet Take 81 mg by mouth daily.   Yes [provider]  atorvastatin (LIPITOR) 10 MG tablet Take 10 mg by mouth daily.  02/11/13  Yes [provider]  divalproex (DEPAKOTE ER) 250 MG 24 hr tablet TAKE 3 TABLETS (750 MG TOTAL) BY MOUTH DAILY. 06/16/17  Yes Sethi, Lucy Antigua, MD  LEVEMIR FLEXTOUCH 100 UNIT/ML Pen INJECT 10 UNITS SUBCUTANEOUSLY DAILY AT NIGHT Patient taking differently: Inject 8 Units into the skin daily at 10 pm.  06/15/17  Yes Reyne Dumas, MD  Magnesium 500 MG TABS Take 1 tablet (500 mg total) by mouth 2 (two) times daily. 06/11/17  Yes Hongalgi, Lenis Dickinson, MD  Multiple Vitamin (MULTIVITAMIN) capsule Take 1 capsule by mouth daily.   Yes [provider]  NOVOLOG FLEXPEN 100 UNIT/ML SOPN FlexPen Inject 4-9 Units into the skin 3 (three) times daily with meals. Per sliding scale, 4 units if over 200 = 5 units in the morning and at lunch and 8 units if over 200 = 9 units. 07/29/13  Yes [provider]  omeprazole (PRILOSEC) 20 MG capsule Take 20 mg by mouth every morning.  03/17/15  Yes [provider]  vitamin C (ASCORBIC ACID) 500 MG tablet Take 500 mg by mouth daily.   Yes [provider]  cephALEXin (KEFLEX) 500 MG capsule Take 1 capsule (500 mg total) by mouth 2 (two) times daily. Patient not taking: Reported on 07/27/2017 06/11/17   Modena Jansky, MD  metoprolol succinate (TOPROL-XL) 25 MG 24 hr tablet Take 1 tablet (25 mg total) by  mouth daily. Patient not taking: Reported on 07/27/2017 06/12/17   Modena Jansky, MD     Physical Exam: Vitals:   07/27/17 0514  BP: (!) 154/92  Pulse: (!) 110  Resp: 20  Temp: 97.6 F (36.4 C)  TempSrc: Oral  SpO2: 97%        Vitals:   07/27/17 0514  BP: (!) 154/92  Pulse: (!) 110  Resp: 20  Temp: 97.6 F (36.4 C)  TempSrc: Oral  SpO2: 97%   Constitutional: NAD, calm, comfortable Eyes: PERRL, lids and conjunctivae normal ENMT: Mucous membranes are moist. Posterior pharynx clear of any exudate or lesions.Normal dentition.  Neck: normal, supple, no masses, no thyromegaly Respiratory: clear to auscultation bilaterally, no wheezing, no crackles. Normal respiratory effort. No accessory muscle use.  Cardiovascular: Regular rate and rhythm, no murmurs / rubs / gallops. No extremity edema. 2+ pedal pulses. No carotid bruits.  Abdomen: no tenderness, no masses palpated. No  hepatosplenomegaly. Bowel sounds positive.  Musculoskeletal: Normal range of motion.  There is tenderness to palpation over the lateral aspect of the right hip. There is pain with any range of motion. There does appear to be slight external rotation of the right leg. DP pulses are palpable bilaterally. Motor and sensation is intact to both feet.  Skin: no rashes, lesions, ulcers. No induration Neurologic: CN 2-12 grossly intact. Sensation intact, DTR normal. Strength 5/5 in all 4.  Psychiatric: Normal judgment and insight. Alert and oriented x 3. Normal mood.     Labs on Admission: I have personally reviewed following labs and imaging studies  CBC:  Recent Labs Lab 07/27/17 0550  WBC 11.4*  NEUTROABS 6.5  HGB 11.0*  HCT 33.9*  MCV 101.5*  PLT 852    Basic Metabolic Panel:  Recent Labs Lab 07/27/17 0550  NA 139  K 4.6  CL 100*  CO2 30  GLUCOSE 137*  BUN 23*  CREATININE 1.41*  CALCIUM 8.9    GFR: CrCl cannot be calculated (Unknown ideal weight.).  Liver Function Tests: No  results for input(s): AST, ALT, ALKPHOS, BILITOT, PROT, ALBUMIN in the last 168 hours. No results for input(s): LIPASE, AMYLASE in the last 168 hours. No results for input(s): AMMONIA in the last 168 hours.  Coagulation Profile:  Recent Labs Lab 07/27/17 0550  INR 0.90   No results for input(s): DDIMER in the last 72 hours.  Cardiac Enzymes: No results for input(s): CKTOTAL, CKMB, CKMBINDEX, TROPONINI in the last 168 hours.  BNP (last 3 results) No results for input(s): PROBNP in the last 8760 hours.  HbA1C: No results for input(s): HGBA1C in the last 72 hours. Lab Results  Component Value Date   HGBA1C 7.6 (H) 06/07/2017   HGBA1C 7.3 (H) 06/05/2017   HGBA1C 8.6 (H) 06/29/2013     CBG: No results for input(s): GLUCAP in the last 168 hours.  Lipid Profile: No results for input(s): CHOL, HDL, LDLCALC, TRIG, CHOLHDL, LDLDIRECT in the last 72 hours.  Thyroid Function Tests: No results for input(s): TSH, T4TOTAL, FREET4, T3FREE, THYROIDAB in the last 72 hours.  Anemia Panel: No results for input(s): VITAMINB12, FOLATE, FERRITIN, TIBC, IRON, RETICCTPCT in the last 72 hours.  Urine analysis:    Component Value Date/Time   COLORURINE YELLOW 06/11/2017 [place] 06/11/2017 2215   LABSPEC 1.011 06/11/2017 2215   PHURINE 6.0 06/11/2017 2215   GLUCOSEU >=500 (A) 06/11/2017 2215   HGBUR MODERATE (A) 06/11/2017 2215   BILIRUBINUR NEGATIVE 06/11/2017 2215   BILIRUBINUR neg 08/16/2012 1531   KETONESUR 5 (A) 06/11/2017 2215   PROTEINUR 30 (A) 06/11/2017 2215   UROBILINOGEN 0.2 06/25/2013 1002   NITRITE NEGATIVE 06/11/2017 2215   LEUKOCYTESUR LARGE (A) 06/11/2017 2215    Sepsis Labs: @LABRCNTIP (procalcitonin:4,lacticidven:4) )No results found for this or any previous visit (from the past 240 hour(s)).       Radiological Exams on Admission: Dg Chest 1 View  Result Date: 07/27/2017 CLINICAL DATA:  Fall tonight with right hip fracture. EXAM: CHEST 1  VIEW COMPARISON:  Radiograph 06/11/2017 FINDINGS: The cardiomediastinal contours are normal. Mild biapical pleuroparenchymal scarring. Pulmonary vasculature is normal. No consolidation, pleural effusion, or pneumothorax. No acute osseous abnormalities are seen. IMPRESSION: No acute abnormality. Electronically Signed   By: Jeb Levering M.D.   On: 07/27/2017 06:16   Dg Hip Unilat W Or Wo Pelvis 2-3 Views Right  Result Date: 07/27/2017 CLINICAL DATA:  Fall tonight with right hip pain. EXAM:  DG HIP (WITH OR WITHOUT PELVIS) 2-3V RIGHT COMPARISON:  None. FINDINGS: Displaced right femoral neck fracture with minimal proximal migration of the femoral shaft. Femoral head remains seated. Remainder the bony pelvis is intact without additional fracture. Pubic symphysis and sacroiliac joints are congruent. IMPRESSION: Mildly displaced right femoral neck fracture. Electronically Signed   By: Jeb Levering M.D.   On: 07/27/2017 06:14   Dg Chest 1 View  Result Date: 07/27/2017 CLINICAL DATA:  Fall tonight with right hip fracture. EXAM: CHEST 1 VIEW COMPARISON:  Radiograph 06/11/2017 FINDINGS: The cardiomediastinal contours are normal. Mild biapical pleuroparenchymal scarring. Pulmonary vasculature is normal. No consolidation, pleural effusion, or pneumothorax. No acute osseous abnormalities are seen. IMPRESSION: No acute abnormality. Electronically Signed   By: Jeb Levering M.D.   On: 07/27/2017 06:16   Dg Hip Unilat W Or Wo Pelvis 2-3 Views Right  Result Date: 07/27/2017 CLINICAL DATA:  Fall tonight with right hip pain. EXAM: DG HIP (WITH OR WITHOUT PELVIS) 2-3V RIGHT COMPARISON:  None. FINDINGS: Displaced right femoral neck fracture with minimal proximal migration of the femoral shaft. Femoral head remains seated. Remainder the bony pelvis is intact without additional fracture. Pubic symphysis and sacroiliac joints are congruent. IMPRESSION: Mildly displaced right femoral neck fracture. Electronically Signed    By: Jeb Levering M.D.   On: 07/27/2017 06:14      EKG: Independently reviewed. Sinus tachycardia Probable left atrial enlargement IVCD, consider atypical RBBB Baseline wander in lead(s) III no significant change from 06/11/2017   Assessment/Plan Principal Problem:   Closed right hip fracture, initial encounter [facility]) Admitted under hip fracture protocol Transferred to [place] for repair Dr. Ninfa Linden has been notified Based on risk factors, age , low to intermediate risk for operative repair DVT prophylaxis per orthopedics   CKD -Patient with baseline creatinine 1.6-1.7, creatinine is 1.4 today -She did have renal failure during prior hospitalization with creatinine peak at 2.19; renal US 8/4 with mild right hydronephrosis  HTN  Continue Cardizem, metoprolol  Anemia -Hgb stable   -Will follow  DM -Recent A1c 7.3 -Continue   SSI  Malnutrition Check vitamin D level  Seizures -Continue Depakote, follow on level   DVT prophylaxis: *Lovenox     Code Status Orders full code        consults called: Orthopedics Dr. Ninfa Linden  Family Communication: Admission, patients condition and plan of care including tests being ordered have been discussed with the patient  who indicates understanding and agree with the plan and Code Status  Admission status: inpatient    Disposition plan: Further plan will depend as patient's clinical course evolves and further radiologic and laboratory data become available. Likely home when stable   At the time of admission, it appears that the appropriate admission status for this patient is INPATIENT .Thisis judged to be reasonable and necessary in order to provide the required intensity of service to ensure the patient's safetygiven thepresenting symptoms, physical exam findings, and initial radiographic and laboratory data in the context of their chronic comorbidities.   Reyne Dumas MD [facility] Pager [phone]  If 7PM-7AM, please contact night-coverage [email or web address] Password [facility]  07/27/2017, 8:19 AM

## 2017-07-28 ENCOUNTER — Inpatient Hospital Stay (HOSPITAL_COMMUNITY): Payer: Medicare Other | Admitting: Anesthesiology

## 2017-07-28 ENCOUNTER — Encounter (HOSPITAL_COMMUNITY)
Admission: EM | Disposition: A | Discharge status: Skilled Nursing Facility | Payer: Self-pay | Source: Home / Self Care | Attending: Family Medicine

## 2017-07-28 ENCOUNTER — Inpatient Hospital Stay (HOSPITAL_COMMUNITY): Payer: Medicare Other

## 2017-07-28 ENCOUNTER — Encounter (HOSPITAL_COMMUNITY): Payer: Self-pay | Admitting: Certified Registered Nurse Anesthetist

## 2017-07-28 DIAGNOSIS — M12551 Traumatic arthropathy, right hip: Secondary | ICD-10-CM

## 2017-07-28 DIAGNOSIS — S72001A Fracture of unspecified part of neck of right femur, initial encounter for closed fracture: Secondary | ICD-10-CM

## 2017-07-28 DIAGNOSIS — N289 Disorder of kidney and ureter, unspecified: Secondary | ICD-10-CM

## 2017-07-28 DIAGNOSIS — N183 Chronic kidney disease, stage 3 (moderate): Secondary | ICD-10-CM

## 2017-07-28 DIAGNOSIS — E1169 Type 2 diabetes mellitus with other specified complication: Secondary | ICD-10-CM

## 2017-07-28 DIAGNOSIS — I10 Essential (primary) hypertension: Secondary | ICD-10-CM

## 2017-07-28 DIAGNOSIS — Z794 Long term (current) use of insulin: Secondary | ICD-10-CM

## 2017-07-28 HISTORY — PX: TOTAL HIP ARTHROPLASTY: SHX124

## 2017-07-28 LAB — CBC
HCT: 35 % — ABNORMAL LOW (ref 36.0–46.0)
Hemoglobin: 10.9 g/dL — ABNORMAL LOW (ref 12.0–15.0)
MCH: 32.2 pg (ref 26.0–34.0)
MCHC: 31.1 g/dL (ref 30.0–36.0)
MCV: 103.2 fL — AB (ref 78.0–100.0)
Platelets: 217 10*3/uL (ref 150–400)
RBC: 3.39 MIL/uL — AB (ref 3.87–5.11)
RDW: 15.3 % (ref 11.5–15.5)
WBC: 10 10*3/uL (ref 4.0–10.5)

## 2017-07-28 LAB — BASIC METABOLIC PANEL
ANION GAP: 6 (ref 5–15)
BUN: 15 mg/dL (ref 6–20)
CALCIUM: 8.8 mg/dL — AB (ref 8.9–10.3)
CO2: 31 mmol/L (ref 22–32)
Chloride: 101 mmol/L (ref 101–111)
Creatinine, Ser: 1.26 mg/dL — ABNORMAL HIGH (ref 0.44–1.00)
GFR, EST AFRICAN AMERICAN: 47 mL/min — AB (ref >60)
GFR, EST NON AFRICAN AMERICAN: 40 mL/min — AB (ref >60)
Glucose, Bld: 152 mg/dL — ABNORMAL HIGH (ref 65–99)
POTASSIUM: 4.5 mmol/L (ref 3.5–5.1)
SODIUM: 138 mmol/L (ref 135–145)

## 2017-07-28 LAB — GLUCOSE, CAPILLARY
GLUCOSE-CAPILLARY: 206 mg/dL — AB (ref 65–99)
GLUCOSE-CAPILLARY: 188 mg/dL — AB (ref 65–99)
GLUCOSE-CAPILLARY: 102 mg/dL — AB (ref 65–99)
GLUCOSE-CAPILLARY: 152 mg/dL — AB (ref 65–99)
Glucose-Capillary: 70 mg/dL (ref 65–99)
Glucose-Capillary: 112 mg/dL — ABNORMAL HIGH (ref 65–99)
Glucose-Capillary: 195 mg/dL — ABNORMAL HIGH (ref 65–99)

## 2017-07-28 LAB — VITAMIN D 25 HYDROXY (VIT D DEFICIENCY, FRACTURES): Vit D, 25-Hydroxy: 37.9 ng/mL (ref 30.0–100.0)

## 2017-07-28 LAB — SURGICAL PCR SCREEN
MRSA, PCR: NEGATIVE
Staphylococcus aureus: POSITIVE — AB

## 2017-07-28 SURGERY — ARTHROPLASTY, HIP, TOTAL, ANTERIOR APPROACH
Anesthesia: General | Site: Hip | Laterality: Right

## 2017-07-28 MED ORDER — DIVALPROEX SODIUM ER 500 MG PO TB24
750.0000 mg | ORAL_TABLET | Freq: Every day | ORAL | Status: DC
  Administered 2017-07-29 – 2017-07-30 (×2): 750 mg via ORAL
  Filled 2017-07-28 (×3): qty 1

## 2017-07-28 MED ORDER — DEXTROSE 50 % IV SOLN
INTRAVENOUS | Status: AC
  Administered 2017-07-28: 25 mL
  Filled 2017-07-28: qty 50

## 2017-07-28 MED ORDER — ONDANSETRON HCL 4 MG/2ML IJ SOLN
INTRAMUSCULAR | Status: DC | PRN
  Administered 2017-07-28: 4 mg via INTRAVENOUS

## 2017-07-28 MED ORDER — ENSURE ENLIVE PO LIQD: 237.0000 mL | Freq: Three times a day (TID) | ORAL | Status: DC

## 2017-07-28 MED ORDER — SODIUM CHLORIDE 0.9 % IV BOLUS (SEPSIS)
500.0000 mL | Freq: Once | INTRAVENOUS | Status: AC
  Administered 2017-07-28: 500 mL via INTRAVENOUS

## 2017-07-28 MED ORDER — PROPOFOL 10 MG/ML IV BOLUS
INTRAVENOUS | Status: AC
  Filled 2017-07-28: qty 20

## 2017-07-28 MED ORDER — METOPROLOL SUCCINATE ER 25 MG PO TB24
25.0000 mg | ORAL_TABLET | Freq: Every day | ORAL | Status: DC
Start: 2017-07-29 — End: 2017-07-31
  Administered 2017-07-29 – 2017-07-30 (×2): 25 mg via ORAL
  Filled 2017-07-28 (×2): qty 1

## 2017-07-28 MED ORDER — LIDOCAINE 2% (20 MG/ML) 5 ML SYRINGE
INTRAMUSCULAR | Status: AC
  Filled 2017-07-28: qty 5

## 2017-07-28 MED ORDER — INSULIN DETEMIR 100 UNIT/ML ~~LOC~~ SOLN
8.0000 [IU] | Freq: Every day | SUBCUTANEOUS | Status: DC
Start: 2017-07-28 — End: 2017-07-31
  Administered 2017-07-28 – 2017-07-29 (×3): 8 [IU] via SUBCUTANEOUS
  Filled 2017-07-28 (×5): qty 0.08

## 2017-07-28 MED ORDER — ONDANSETRON HCL 4 MG/2ML IJ SOLN
INTRAMUSCULAR | Status: AC
  Filled 2017-07-28: qty 2

## 2017-07-28 MED ORDER — PROPOFOL 10 MG/ML IV BOLUS
INTRAVENOUS | Status: DC | PRN
  Administered 2017-07-28: 80 mg via INTRAVENOUS

## 2017-07-28 MED ORDER — INSULIN ASPART 100 UNIT/ML FLEXPEN: 4.0000 [IU] | PEN_INJECTOR | Freq: Three times a day (TID) | SUBCUTANEOUS | Status: DC

## 2017-07-28 MED ORDER — ONDANSETRON HCL 4 MG PO TABS: 4.0000 mg | ORAL_TABLET | Freq: Four times a day (QID) | ORAL | Status: DC | PRN

## 2017-07-28 MED ORDER — LIDOCAINE HCL (CARDIAC) 20 MG/ML IV SOLN
INTRAVENOUS | Status: DC | PRN
  Administered 2017-07-28: 100 mg via INTRAVENOUS

## 2017-07-28 MED ORDER — ACETAMINOPHEN 325 MG PO TABS: 650.0000 mg | ORAL_TABLET | Freq: Four times a day (QID) | ORAL | Status: DC | PRN

## 2017-07-28 MED ORDER — DOCUSATE SODIUM 100 MG PO CAPS
100.0000 mg | ORAL_CAPSULE | Freq: Two times a day (BID) | ORAL | Status: DC
  Administered 2017-07-28 – 2017-07-30 (×5): 100 mg via ORAL
  Filled 2017-07-28 (×5): qty 1

## 2017-07-28 MED ORDER — POLYETHYLENE GLYCOL 3350 17 G PO PACK: 17.0000 g | PACK | Freq: Every day | ORAL | Status: DC | PRN

## 2017-07-28 MED ORDER — METHOCARBAMOL 500 MG PO TABS
500.0000 mg | ORAL_TABLET | Freq: Four times a day (QID) | ORAL | Status: DC | PRN
  Administered 2017-07-29 – 2017-07-30 (×2): 500 mg via ORAL
  Filled 2017-07-28 (×2): qty 1

## 2017-07-28 MED ORDER — PHENOL 1.4 % MT LIQD: 1.0000 | OROMUCOSAL | Status: DC | PRN

## 2017-07-28 MED ORDER — PHENYLEPHRINE HCL 10 MG/ML IJ SOLN
INTRAVENOUS | Status: DC | PRN
  Administered 2017-07-28: 40 ug/min via INTRAVENOUS

## 2017-07-28 MED ORDER — ALBUMIN HUMAN 5 % IV SOLN
INTRAVENOUS | Status: DC | PRN
  Administered 2017-07-28: 16:00:00 via INTRAVENOUS

## 2017-07-28 MED ORDER — MENTHOL 3 MG MT LOZG: 1.0000 | LOZENGE | OROMUCOSAL | Status: DC | PRN

## 2017-07-28 MED ORDER — METOPROLOL TARTRATE 5 MG/5ML IV SOLN
2.5000 mg | Freq: Once | INTRAVENOUS | Status: AC
  Administered 2017-07-28: 2.5 mg via INTRAVENOUS
  Filled 2017-07-28: qty 5

## 2017-07-28 MED ORDER — INSULIN ASPART 100 UNIT/ML ~~LOC~~ SOLN: 0.0000 [IU] | SUBCUTANEOUS | Status: DC

## 2017-07-28 MED ORDER — DEXTROSE 50 % IV SOLN: 12.5000 g | Freq: Once | INTRAVENOUS | Status: DC

## 2017-07-28 MED ORDER — ACETAMINOPHEN 650 MG RE SUPP: 650.0000 mg | Freq: Four times a day (QID) | RECTAL | Status: DC | PRN

## 2017-07-28 MED ORDER — ASPIRIN 81 MG PO CHEW: 81.0000 mg | CHEWABLE_TABLET | Freq: Every day | ORAL | Status: DC

## 2017-07-28 MED ORDER — PANTOPRAZOLE SODIUM 40 MG PO TBEC
40.0000 mg | DELAYED_RELEASE_TABLET | Freq: Every day | ORAL | Status: DC
  Administered 2017-07-29 – 2017-07-30 (×2): 40 mg via ORAL
  Filled 2017-07-28 (×2): qty 1

## 2017-07-28 MED ORDER — METOPROLOL SUCCINATE ER 25 MG PO TB24
25.0000 mg | ORAL_TABLET | Freq: Every day | ORAL | Status: DC
  Administered 2017-07-28: 25 mg via ORAL
  Filled 2017-07-28: qty 1

## 2017-07-28 MED ORDER — CHLORHEXIDINE GLUCONATE 4 % EX LIQD: 60.0000 mL | Freq: Once | CUTANEOUS | Status: DC

## 2017-07-28 MED ORDER — ONDANSETRON HCL 4 MG/2ML IJ SOLN: 4.0000 mg | Freq: Four times a day (QID) | INTRAMUSCULAR | Status: DC | PRN

## 2017-07-28 MED ORDER — LACTATED RINGERS IV SOLN
INTRAVENOUS | Status: DC
  Administered 2017-07-28 (×2): via INTRAVENOUS

## 2017-07-28 MED ORDER — MORPHINE SULFATE (PF) 4 MG/ML IV SOLN: 0.5000 mg | INTRAVENOUS | Status: DC | PRN

## 2017-07-28 MED ORDER — CEFAZOLIN SODIUM-DEXTROSE 2-4 GM/100ML-% IV SOLN
2.0000 g | INTRAVENOUS | Status: AC
  Administered 2017-07-28: 2 g via INTRAVENOUS
  Filled 2017-07-28: qty 100

## 2017-07-28 MED ORDER — MAGNESIUM OXIDE 400 (241.3 MG) MG PO TABS
400.0000 mg | ORAL_TABLET | Freq: Two times a day (BID) | ORAL | Status: DC
  Administered 2017-07-28 – 2017-07-30 (×5): 400 mg via ORAL
  Filled 2017-07-28 (×5): qty 1

## 2017-07-28 MED ORDER — MORPHINE SULFATE (PF) 4 MG/ML IV SOLN
0.5000 mg | INTRAVENOUS | Status: DC | PRN
  Administered 2017-07-28 (×2): 0.52 mg via INTRAVENOUS
  Filled 2017-07-28 (×2): qty 1

## 2017-07-28 MED ORDER — SUGAMMADEX SODIUM 200 MG/2ML IV SOLN
INTRAVENOUS | Status: AC
  Filled 2017-07-28: qty 2

## 2017-07-28 MED ORDER — 0.9 % SODIUM CHLORIDE (POUR BTL) OPTIME
TOPICAL | Status: DC | PRN
  Administered 2017-07-28: 1000 mL

## 2017-07-28 MED ORDER — ADULT MULTIVITAMIN W/MINERALS CH
1.0000 | ORAL_TABLET | Freq: Every day | ORAL | Status: DC
  Administered 2017-07-29 – 2017-07-30 (×2): 1 via ORAL
  Filled 2017-07-28 (×2): qty 1

## 2017-07-28 MED ORDER — METHOCARBAMOL 1000 MG/10ML IJ SOLN
500.0000 mg | Freq: Four times a day (QID) | INTRAVENOUS | Status: DC | PRN
  Filled 2017-07-28: qty 5

## 2017-07-28 MED ORDER — SUGAMMADEX SODIUM 200 MG/2ML IV SOLN
INTRAVENOUS | Status: DC | PRN
  Administered 2017-07-28: 150 mg via INTRAVENOUS

## 2017-07-28 MED ORDER — FENTANYL CITRATE (PF) 250 MCG/5ML IJ SOLN
INTRAMUSCULAR | Status: AC
Start: 2017-07-28 — End: 2017-07-28
  Filled 2017-07-28: qty 5

## 2017-07-28 MED ORDER — POVIDONE-IODINE 10 % EX SWAB: 2.0000 "application " | Freq: Once | CUTANEOUS | Status: DC

## 2017-07-28 MED ORDER — CEFAZOLIN SODIUM-DEXTROSE 2-4 GM/100ML-% IV SOLN
2.0000 g | Freq: Four times a day (QID) | INTRAVENOUS | Status: AC
  Administered 2017-07-28 – 2017-07-29 (×2): 2 g via INTRAVENOUS
  Filled 2017-07-28 (×3): qty 100

## 2017-07-28 MED ORDER — HYDROCODONE-ACETAMINOPHEN 5-325 MG PO TABS
1.0000 | ORAL_TABLET | Freq: Four times a day (QID) | ORAL | Status: DC | PRN
  Administered 2017-07-29 – 2017-07-30 (×2): 1 via ORAL
  Filled 2017-07-28 (×2): qty 1

## 2017-07-28 MED ORDER — METOCLOPRAMIDE HCL 5 MG/ML IJ SOLN: 5.0000 mg | Freq: Three times a day (TID) | INTRAMUSCULAR | Status: DC | PRN

## 2017-07-28 MED ORDER — ROCURONIUM BROMIDE 100 MG/10ML IV SOLN
INTRAVENOUS | Status: DC | PRN
  Administered 2017-07-28: 50 mg via INTRAVENOUS

## 2017-07-28 MED ORDER — SODIUM CHLORIDE 0.9 % IR SOLN
Status: DC | PRN
  Administered 2017-07-28: 3000 mL

## 2017-07-28 MED ORDER — FENTANYL CITRATE (PF) 100 MCG/2ML IJ SOLN
INTRAMUSCULAR | Status: DC | PRN
  Administered 2017-07-28 (×2): 50 ug via INTRAVENOUS
  Administered 2017-07-28: 100 ug via INTRAVENOUS

## 2017-07-28 MED ORDER — SODIUM CHLORIDE 0.9 % IV SOLN
INTRAVENOUS | Status: DC
  Administered 2017-07-29: 22:00:00 via INTRAVENOUS

## 2017-07-28 MED ORDER — ESMOLOL HCL 100 MG/10ML IV SOLN
INTRAVENOUS | Status: AC
  Filled 2017-07-28: qty 10

## 2017-07-28 MED ORDER — DILTIAZEM HCL 60 MG PO TABS
60.0000 mg | ORAL_TABLET | Freq: Two times a day (BID) | ORAL | Status: DC
  Administered 2017-07-28: 60 mg via ORAL
  Filled 2017-07-28 (×2): qty 1

## 2017-07-28 MED ORDER — PHENYLEPHRINE HCL 10 MG/ML IJ SOLN
INTRAMUSCULAR | Status: DC | PRN
  Administered 2017-07-28: 80 ug via INTRAVENOUS
  Administered 2017-07-28: 120 ug via INTRAVENOUS
  Administered 2017-07-28: 80 ug via INTRAVENOUS
  Administered 2017-07-28: 120 ug via INTRAVENOUS

## 2017-07-28 MED ORDER — METOCLOPRAMIDE HCL 5 MG PO TABS: 5.0000 mg | ORAL_TABLET | Freq: Three times a day (TID) | ORAL | Status: DC | PRN

## 2017-07-28 MED ORDER — ATORVASTATIN CALCIUM 10 MG PO TABS
10.0000 mg | ORAL_TABLET | Freq: Every day | ORAL | Status: DC
  Administered 2017-07-29 – 2017-07-30 (×2): 10 mg via ORAL
  Filled 2017-07-28 (×2): qty 1

## 2017-07-28 MED ORDER — ASPIRIN EC 325 MG PO TBEC
325.0000 mg | DELAYED_RELEASE_TABLET | Freq: Every day | ORAL | Status: DC
  Administered 2017-07-29 – 2017-07-30 (×2): 325 mg via ORAL
  Filled 2017-07-28 (×2): qty 1

## 2017-07-28 MED ORDER — INSULIN ASPART 100 UNIT/ML ~~LOC~~ SOLN
0.0000 [IU] | Freq: Three times a day (TID) | SUBCUTANEOUS | Status: DC
  Administered 2017-07-28 – 2017-07-29 (×2): 2 [IU] via SUBCUTANEOUS
  Administered 2017-07-29: 9 [IU] via SUBCUTANEOUS
  Administered 2017-07-29: 5 [IU] via SUBCUTANEOUS
  Administered 2017-07-30 (×2): 2 [IU] via SUBCUTANEOUS

## 2017-07-28 SURGICAL SUPPLY — 56 items
APL SKNCLS STERI-STRIP NONHPOA (GAUZE/BANDAGES/DRESSINGS) ×1
BENZOIN TINCTURE PRP APPL 2/3 (GAUZE/BANDAGES/DRESSINGS) ×3 IMPLANT
BLADE CLIPPER SURG (BLADE) IMPLANT
BLADE SAW SGTL 18X1.27X75 (BLADE) ×2 IMPLANT
BLADE SAW SGTL 18X1.27X75MM (BLADE) ×1
CAPT HIP TOTAL 2 ×2 IMPLANT
CELLS DAT CNTRL 66122 CELL SVR (MISCELLANEOUS) ×1 IMPLANT
CLOSURE WOUND 1/2 X4 (GAUZE/BANDAGES/DRESSINGS) ×2
COVER SURGICAL LIGHT HANDLE (MISCELLANEOUS) ×3 IMPLANT
DRAPE C-ARM 42X72 X-RAY (DRAPES) ×3 IMPLANT
DRAPE STERI IOBAN 125X83 (DRAPES) ×3 IMPLANT
DRAPE U-SHAPE 47X51 STRL (DRAPES) ×9 IMPLANT
DRSG AQUACEL AG ADV 3.5X10 (GAUZE/BANDAGES/DRESSINGS) ×3 IMPLANT
DURAPREP 26ML APPLICATOR (WOUND CARE) ×3 IMPLANT
ELECT BLADE 4.0 EZ CLEAN MEGAD (MISCELLANEOUS) ×3
ELECT BLADE 6.5 EXT (BLADE) IMPLANT
ELECT REM PT RETURN 9FT ADLT (ELECTROSURGICAL) ×3
ELECTRODE BLDE 4.0 EZ CLN MEGD (MISCELLANEOUS) ×1 IMPLANT
ELECTRODE REM PT RTRN 9FT ADLT (ELECTROSURGICAL) ×1 IMPLANT
FACESHIELD WRAPAROUND (MASK) ×6 IMPLANT
FACESHIELD WRAPAROUND OR TEAM (MASK) ×2 IMPLANT
GAUZE XEROFORM 1X8 LF (GAUZE/BANDAGES/DRESSINGS) ×2 IMPLANT
GLOVE BIOGEL PI IND STRL 8 (GLOVE) ×2 IMPLANT
GLOVE BIOGEL PI INDICATOR 8 (GLOVE) ×4
GLOVE ECLIPSE 8.0 STRL XLNG CF (GLOVE) ×3 IMPLANT
GLOVE ORTHO TXT STRL SZ7.5 (GLOVE) ×6 IMPLANT
GOWN STRL REUS W/ TWL LRG LVL3 (GOWN DISPOSABLE) ×2 IMPLANT
GOWN STRL REUS W/ TWL XL LVL3 (GOWN DISPOSABLE) ×2 IMPLANT
GOWN STRL REUS W/TWL LRG LVL3 (GOWN DISPOSABLE) ×6
GOWN STRL REUS W/TWL XL LVL3 (GOWN DISPOSABLE) ×6
HANDPIECE INTERPULSE COAX TIP (DISPOSABLE) ×3
KIT BASIN OR (CUSTOM PROCEDURE TRAY) ×3 IMPLANT
KIT ROOM TURNOVER OR (KITS) ×3 IMPLANT
MANIFOLD NEPTUNE II (INSTRUMENTS) ×3 IMPLANT
NS IRRIG 1000ML POUR BTL (IV SOLUTION) ×3 IMPLANT
PACK TOTAL JOINT (CUSTOM PROCEDURE TRAY) ×3 IMPLANT
PAD ARMBOARD 7.5X6 YLW CONV (MISCELLANEOUS) ×3 IMPLANT
RETRACTOR WND ALEXIS 18 MED (MISCELLANEOUS) ×1 IMPLANT
RTRCTR WOUND ALEXIS 18CM MED (MISCELLANEOUS) ×3
SET HNDPC FAN SPRY TIP SCT (DISPOSABLE) ×1 IMPLANT
STAPLER SKIN PROX WIDE 3.9 (STAPLE) ×2 IMPLANT
STAPLER VISISTAT 35W (STAPLE) IMPLANT
STRIP CLOSURE SKIN 1/2X4 (GAUZE/BANDAGES/DRESSINGS) ×4 IMPLANT
SUT ETHIBOND NAB CT1 #1 30IN (SUTURE) ×3 IMPLANT
SUT MNCRL AB 4-0 PS2 18 (SUTURE) IMPLANT
SUT VIC AB 0 CT1 27 (SUTURE) ×3
SUT VIC AB 0 CT1 27XBRD ANBCTR (SUTURE) ×1 IMPLANT
SUT VIC AB 1 CT1 27 (SUTURE) ×3
SUT VIC AB 1 CT1 27XBRD ANBCTR (SUTURE) ×1 IMPLANT
SUT VIC AB 2-0 CT1 27 (SUTURE) ×3
SUT VIC AB 2-0 CT1 TAPERPNT 27 (SUTURE) ×1 IMPLANT
TOWEL OR 17X24 6PK STRL BLUE (TOWEL DISPOSABLE) ×3 IMPLANT
TOWEL OR 17X26 10 PK STRL BLUE (TOWEL DISPOSABLE) ×3 IMPLANT
TRAY CATH 16FR W/PLASTIC CATH (SET/KITS/TRAYS/PACK) IMPLANT
TRAY FOLEY W/METER SILVER 16FR (SET/KITS/TRAYS/PACK) IMPLANT
WATER STERILE IRR 1000ML POUR (IV SOLUTION) ×6 IMPLANT

## 2017-07-28 NOTE — Anesthesia Preprocedure Evaluation (Signed)
Anesthesia Evaluation  Patient identified by MRN, date of birth, ID band Patient awake    Reviewed: Allergy & Precautions, NPO status , Patient's Chart, lab work & pertinent test results  Airway Mallampati: I  TM Distance: >3 FB Neck ROM: Full    Dental   Pulmonary    Pulmonary exam normal        Cardiovascular hypertension, Pt. on medications Normal cardiovascular exam     Neuro/Psych Depression TIACVA    GI/Hepatic GERD  Medicated and Controlled,  Endo/Other  diabetes  Renal/GU Renal InsufficiencyRenal disease     Musculoskeletal   Abdominal   Peds  Hematology   Anesthesia Other Findings   Reproductive/Obstetrics                             Anesthesia Physical Anesthesia Plan  ASA: III  Anesthesia Plan: General   Post-op Pain Management:    Induction: Intravenous  PONV Risk Score and Plan: 3 and Ondansetron and Dexamethasone  Airway Management Planned: Oral ETT  Additional Equipment:   Intra-op Plan:   Post-operative Plan: Extubation in OR  Informed Consent: I have reviewed the patients History and Physical, chart, labs and discussed the procedure including the risks, benefits and alternatives for the proposed anesthesia with the patient or authorized representative who has indicated his/her understanding and acceptance.     Plan Discussed with: CRNA and Surgeon  Anesthesia Plan Comments:         Anesthesia Quick Evaluation

## 2017-07-28 NOTE — Transfer of Care (Signed)
Immediate Anesthesia Transfer of Care Note  Patient: [name] [name]  Procedure(s) Performed: Procedure(s): TOTAL HIP ARTHROPLASTY ANTERIOR APPROACH (Right)  Patient Location: PACU  Anesthesia Type:General  Level of Consciousness: awake and responds to stimulation  Airway & Oxygen Therapy: Patient Spontanous Breathing and Patient connected to nasal cannula oxygen  Post-op Assessment: Report given to RN and Post -op Vital signs reviewed and stable  Post vital signs: Reviewed and stable  Last Vitals:  Vitals:   07/28/17 0930 07/28/17 1651  BP: (!) 146/66 (!) 120/58  Pulse: (!) 120 (!) 112  Resp:  10  Temp:  (!) 36.2 C  SpO2:  97%    Last Pain:  Vitals:   07/28/17 1651  TempSrc:   PainSc: (P) 0-No pain      Patients Stated Pain Goal: 3 (16/10/96 0454)  Complications: No apparent anesthesia complications

## 2017-07-28 NOTE — Anesthesia Postprocedure Evaluation (Addendum)
Anesthesia Post Note  Patient: [name]  Procedure(s) Performed: Procedure(s) (LRB): TOTAL HIP ARTHROPLASTY ANTERIOR APPROACH (Right)     Patient location during evaluation: PACU Anesthesia Type: General Level of consciousness: oriented and awake and alert Pain management: pain level controlled Vital Signs Assessment: post-procedure vital signs reviewed and stable Respiratory status: spontaneous breathing, respiratory function stable and patient connected to nasal cannula oxygen Cardiovascular status: blood pressure returned to baseline and stable Postop Assessment: no headache, no backache and no apparent nausea or vomiting Anesthetic complications: no    Last Vitals:  Vitals:   07/28/17 1715 07/28/17 1740  BP: 131/76 123/68  Pulse: (!) 112 (!) 112  Resp: 19 13  Temp:    SpO2: 99% 100%    Last Pain:  Vitals:   07/28/17 1715  TempSrc:   PainSc: Asleep                 Juaquin Ludington DAVID

## 2017-07-28 NOTE — Brief Op Note (Signed)
07/27/2017 - 07/28/2017  4:33 PM  PATIENT:  [name] [name]  76 y.o. female  PRE-OPERATIVE DIAGNOSIS:  right hip femoral neck fracture  POST-OPERATIVE DIAGNOSIS:  right hip femoral neck fracture  PROCEDURE:  Procedure(s): TOTAL HIP ARTHROPLASTY ANTERIOR APPROACH (Right)  SURGEON:  Surgeon(s) and Role:    Mcarthur Rossetti, MD - Primary  PHYSICIAN ASSISTANT: Benita Stabile, PA-C  ANESTHESIA:   general  EBL:  Total I/O In: 1250 [I.V.:1000; IV Piggyback:250] Out: 350 [Urine:200; Blood:150]  COUNTS:  YES  DICTATION: .Other Dictation: Dictation Number [id number]  PLAN OF CARE: Admit to inpatient   PATIENT DISPOSITION:  PACU - hemodynamically stable.   Delay start of Pharmacological VTE agent (>24hrs) due to surgical blood loss or risk of bleeding: no

## 2017-07-28 NOTE — Anesthesia Procedure Notes (Signed)
Procedure Name: Intubation Date/Time: 07/28/2017 3:15 PM Performed by: Salli Quarry Tata Timmins Pre-anesthesia Checklist: Patient identified, Emergency Drugs available, Suction available and Patient being monitored Patient Re-evaluated:Patient Re-evaluated prior to induction Oxygen Delivery Method: Circle System Utilized Preoxygenation: Pre-oxygenation with 100% oxygen Induction Type: IV induction Ventilation: Mask ventilation without difficulty Laryngoscope Size: Mac and 3 Grade View: Grade II Tube type: Oral Tube size: 7.0 mm Number of attempts: 1 Airway Equipment and Method: Stylet and Oral airway Placement Confirmation: ETT inserted through vocal cords under direct vision,  positive ETCO2 and breath sounds checked- equal and bilateral Secured at: 21 cm Tube secured with: Tape Dental Injury: Teeth and Oropharynx as per pre-operative assessment

## 2017-07-28 NOTE — Progress Notes (Signed)
Patient ID: [name] [name], female   DOB: 1941-04-05, 76 y.o.   MRN: [id number] Patient alert but seems confused.  Not sure what her baseline is. She is on the schedule for this afternoon to address her right hip fracture.  She is NPO.  I spoke to her husband yesterday about this in detail.  Will proceed to surgery today unless otherwise contraindicated.

## 2017-07-28 NOTE — Progress Notes (Signed)
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 Discharged from SNF about 10 days ago after admission for e coli sepsis. Fell at home and was brought to the ER at [facility]. Found to have a right hip femoral neck fracture. Ortho called and recommended tx to [facility] for OR today.    Subjective: Seems a bit confused. She is not talking much, does not know the year and has no complaints.  ROS: no complaints of nausea, vomiting, constipation diarrhea, cough, dyspnea or dysuria. No other complaints.   Assessment & Plan:   Principal Problem:   Closed right hip fracture, initial encounter  - Dr Ninfa Linden to repair today- in [place] now - vit D level checked on admission- normal  Active Problems: Anemia - follow for blood loss- Hb stable for now  Mild renal insufficiency with CKD 3 - hydrate through periop period and follow    GERD (gastroesophageal reflux disease) - PPI    DM type 2 (diabetes mellitus, type 2)  - Levemir, ISS    HTN (hypertension) - on Toprol per med rec- continue with holding parameters - d/c Diltiazem which I do not see on her home med rec     DVT prophylaxis: per ortho Code Status: Full code Family Communication:  Disposition Plan: to be determined based on PT eval Consultants:   Ortho Procedures:    Antimicrobials:  Anti-infectives    Start     Dose/Rate Route Frequency Ordered Stop   07/28/17 1400  ceFAZolin (ANCEF) IVPB 2g/100 mL premix     2 g 200 mL/hr over 30 Minutes Intravenous On call to O.R. 07/28/17 0032 07/29/17 0559       Objective: Vitals:   07/27/17 2330 07/28/17 0000 07/28/17 0407 07/28/17 0930  BP: (!) 143/119 (!) 135/92 (!) 148/64 (!) 146/66  Pulse: (!) 148 (!) 131 (!) 118 (!) 120  Resp: (!) 43 (!) 43 (!) 40     Temp:   99.6 F (37.6 C)   TempSrc:   Axillary   SpO2: 100% 95% 92%     Intake/Output Summary (Last 24 hours) at 07/28/17 1345 Last data filed at 07/28/17 0900  Gross per 24 hour  Intake                0 ml  Output             1750 ml  Net            -1750 ml   There were no vitals filed for this visit.  Examination: General exam: Appears comfortable  HEENT: PERRLA, oral mucosa moist, no sclera icterus or thrush Respiratory system: Clear to auscultation. Respiratory effort normal. Cardiovascular system: S1 & S2 heard, RRR.  No murmurs  Gastrointestinal system: Abdomen soft, non-tender, nondistended. Normal bowel sound. No organomegaly Central nervous system: Alert- oriented to person only today. No focal neurological deficits. Extremities: No cyanosis, clubbing or edema- left hip is tender Skin: No rashes or ulcers Psychiatry:  Mood & affect appropriate.     Data Reviewed: I have personally reviewed following labs and imaging studies  CBC:  Recent Labs Lab 07/27/17 0550 07/28/17 0845  WBC 11.4* 10.0  NEUTROABS 6.5  --   HGB 11.0* 10.9*  HCT 33.9* 35.0*  MCV 101.5*  103.2*  PLT 255 712   Basic Metabolic Panel:  Recent Labs Lab 07/27/17 0550 07/28/17 0845  NA 139 138  K 4.6 4.5  CL 100* 101  CO2 30 31  GLUCOSE 137* 152*  BUN 23* 15  CREATININE 1.41* 1.26*  CALCIUM 8.9 8.8*   GFR: CrCl cannot be calculated (Unknown ideal weight.). Liver Function Tests: No results for input(s): AST, ALT, ALKPHOS, BILITOT, PROT, ALBUMIN in the last 168 hours. No results for input(s): LIPASE, AMYLASE in the last 168 hours. No results for input(s): AMMONIA in the last 168 hours. Coagulation Profile:  Recent Labs Lab 07/27/17 0550  INR 0.90   Cardiac Enzymes: No results for input(s): CKTOTAL, CKMB, CKMBINDEX, TROPONINI in the last 168 hours. BNP (last 3 results) No results for input(s): PROBNP in the last 8760 hours. HbA1C: No results for input(s): HGBA1C in the  last 72 hours. CBG:  Recent Labs Lab 07/28/17 0103 07/28/17 0643 07/28/17 1124  GLUCAP 206* 188* 102*   Lipid Profile: No results for input(s): CHOL, HDL, LDLCALC, TRIG, CHOLHDL, LDLDIRECT in the last 72 hours. Thyroid Function Tests: No results for input(s): TSH, T4TOTAL, FREET4, T3FREE, THYROIDAB in the last 72 hours. Anemia Panel: No results for input(s): VITAMINB12, FOLATE, FERRITIN, TIBC, IRON, RETICCTPCT in the last 72 hours. Urine analysis:    Component Value Date/Time   COLORURINE STRAW (A) 07/27/2017 0829   APPEARANCEUR CLEAR 07/27/2017 0829   LABSPEC 1.009 07/27/2017 0829   PHURINE 7.0 07/27/2017 0829   GLUCOSEU NEGATIVE 07/27/2017 0829   HGBUR NEGATIVE 07/27/2017 0829   BILIRUBINUR NEGATIVE 07/27/2017 0829   BILIRUBINUR neg 08/16/2012 1531   KETONESUR 5 (A) 07/27/2017 0829   PROTEINUR NEGATIVE 07/27/2017 0829   UROBILINOGEN 0.2 06/25/2013 1002   NITRITE NEGATIVE 07/27/2017 0829   LEUKOCYTESUR NEGATIVE 07/27/2017 0829   Sepsis Labs: @LABRCNTIP (procalcitonin:4,lacticidven:4) ) Recent Results (from the past 240 hour(s))  Surgical pcr screen     Status: Abnormal   Collection Time: 07/28/17  1:17 AM  Result Value Ref Range Status   MRSA, PCR NEGATIVE NEGATIVE Final   Staphylococcus aureus POSITIVE (A) NEGATIVE Final    Comment: (NOTE) The Xpert SA Assay (FDA approved for NASAL specimens in patients 64 years of age and older), is one component of a comprehensive surveillance program. It is not intended to diagnose infection nor to guide or monitor treatment.          Radiology Studies: Dg Chest 1 View  Result Date: 07/27/2017 CLINICAL DATA:  Fall tonight with right hip fracture. EXAM: CHEST 1 VIEW COMPARISON:  Radiograph 06/11/2017 FINDINGS: The cardiomediastinal contours are normal. Mild biapical pleuroparenchymal scarring. Pulmonary vasculature is normal. No consolidation, pleural effusion, or pneumothorax. No acute osseous abnormalities are seen.  IMPRESSION: No acute abnormality. Electronically Signed   By: Jeb Levering M.D.   On: 07/27/2017 06:16   Dg Hip Unilat W Or Wo Pelvis 2-3 Views Right  Result Date: 07/27/2017 CLINICAL DATA:  Fall tonight with right hip pain. EXAM: DG HIP (WITH OR WITHOUT PELVIS) 2-3V RIGHT COMPARISON:  None. FINDINGS: Displaced right femoral neck fracture with minimal proximal migration of the femoral shaft. Femoral head remains seated. Remainder the bony pelvis is intact without additional fracture. Pubic symphysis and sacroiliac joints are congruent. IMPRESSION: Mildly displaced right femoral neck fracture. Electronically Signed   By: Jeb Levering M.D.   On: 07/27/2017 06:14      Scheduled Meds: . aspirin  81 mg Oral Daily  . atorvastatin  10 mg Oral Daily  .  chlorhexidine  60 mL Topical Once  . diltiazem  60 mg Oral BID  . divalproex  750 mg Oral Daily  . docusate sodium  100 mg Oral BID  . insulin aspart  0-9 Units Subcutaneous TID WC  . insulin detemir  8 Units Subcutaneous Q2200  . magnesium oxide  400 mg Oral BID  . metoprolol succinate  25 mg Oral Daily  . multivitamin with minerals  1 tablet Oral Daily  . pantoprazole  40 mg Oral Daily  . povidone-iodine  2 application Topical Once   Continuous Infusions: .  ceFAZolin (ANCEF) IV       LOS: 1 day    Time spent in minutes: 35    Debbe Odea, MD [facility] Pager: [email or web address] Password [email or web address] 07/28/2017, 1:45 PM

## 2017-07-28 NOTE — Progress Notes (Signed)
Dr. Conrad [place] notified of CBG orders received.

## 2017-07-29 LAB — BASIC METABOLIC PANEL
ANION GAP: 11 (ref 5–15)
BUN: 17 mg/dL (ref 6–20)
CHLORIDE: 97 mmol/L — AB (ref 101–111)
CO2: 26 mmol/L (ref 22–32)
CREATININE: 1.29 mg/dL — AB (ref 0.44–1.00)
Calcium: 8 mg/dL — ABNORMAL LOW (ref 8.9–10.3)
GFR, EST AFRICAN AMERICAN: 45 mL/min — AB (ref >60)
GFR, EST NON AFRICAN AMERICAN: 39 mL/min — AB (ref >60)
Glucose, Bld: 302 mg/dL — ABNORMAL HIGH (ref 65–99)
POTASSIUM: 4.4 mmol/L (ref 3.5–5.1)
SODIUM: 134 mmol/L — AB (ref 135–145)

## 2017-07-29 LAB — GLUCOSE, CAPILLARY
GLUCOSE-CAPILLARY: 286 mg/dL — AB (ref 65–99)
GLUCOSE-CAPILLARY: 200 mg/dL — AB (ref 65–99)
Glucose-Capillary: 386 mg/dL — ABNORMAL HIGH (ref 65–99)
Glucose-Capillary: 182 mg/dL — ABNORMAL HIGH (ref 65–99)

## 2017-07-29 LAB — CBC
HEMATOCRIT: 28.6 % — AB (ref 36.0–46.0)
HEMOGLOBIN: 9 g/dL — AB (ref 12.0–15.0)
MCH: 31.9 pg (ref 26.0–34.0)
MCHC: 31.5 g/dL (ref 30.0–36.0)
MCV: 101.4 fL — ABNORMAL HIGH (ref 78.0–100.0)
Platelets: 139 10*3/uL — ABNORMAL LOW (ref 150–400)
RBC: 2.82 MIL/uL — AB (ref 3.87–5.11)
RDW: 15 % (ref 11.5–15.5)
WBC: 12 10*3/uL — ABNORMAL HIGH (ref 4.0–10.5)

## 2017-07-29 MED ORDER — ASPIRIN 325 MG PO TBEC: 325.0000 mg | DELAYED_RELEASE_TABLET | Freq: Every day | ORAL | 0 refills | Status: DC

## 2017-07-29 MED ORDER — ACETAMINOPHEN 325 MG PO TABS: 650.0000 mg | ORAL_TABLET | Freq: Four times a day (QID) | ORAL | 0 refills | Status: DC | PRN

## 2017-07-29 NOTE — Progress Notes (Signed)
Inpatient Diabetes Program Recommendations  AACE/ADA: New Consensus Statement on Inpatient Glycemic Control (2015)  Target Ranges:  Prepandial:   less than 140 mg/dL      Peak postprandial:   less than 180 mg/dL (1-2 hours)      Critically ill patients:  140 - 180 mg/dL   Lab Results  Component Value Date   GLUCAP 286 (H) 07/29/2017   HGBA1C 7.6 (H) 06/07/2017    Review of Glycemic ControlResults for AWANDA, WILCOCK (MRN [id number]) as of 07/29/2017 09:43  Ref. Range 07/28/2017 06:43 07/28/2017 11:24 07/28/2017 14:20 07/28/2017 14:56 07/28/2017 16:52 07/28/2017 21:39 07/29/2017 06:01  Glucose-Capillary Latest Ref Range: 65 - 99 mg/dL 188 (H) 102 (H) 70 152 (H) 112 (H) 195 (H) 286 (H)   Diabetes history: Type 2 diabetes Outpatient Diabetes medications: Levemir 8 units q HS, Novolog 4-9 units tid with meals Current orders for Inpatient glycemic control:  Levemir 8 units q HS, Novolog sensitive tid with meals  Inpatient Diabetes Program Recommendations:   Fasting>goal.  May consider increasing Levemir to 10 units daily.  Will follow.  Thanks, Adah Perl, RN, BC-ADM Inpatient Diabetes Coordinator Pager [phone] (8a-5p)

## 2017-07-29 NOTE — Progress Notes (Signed)
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 Discharged from SNF about 10 days ago after admission for e coli sepsis. Fell at home and was brought to the ER at [facility]. Found to have a right hip femoral neck fracture. Ortho called and recommended tx to [facility] for [facility] today.    Subjective: Pt has no new complaints. No acute issues overnight.  Assessment & Plan:   Principal Problem:   Closed right hip fracture, initial encounter  - Dr Ninfa Linden on board and patient is s/p right hip repair - vit D level checked on admission- normal  Active Problems: Anemia - follow for blood loss- Hb stable for now  Mild renal insufficiency with CKD 3 - serum creatinine stable.    GERD (gastroesophageal reflux disease) - stable continue PPI    DM type 2 (diabetes mellitus, type 2)  - cotinue Levemir, ISS - place on diabetic diet.    HTN (hypertension) - on Toprol per med rec- continue with holding parameters - d/c Diltiazem which I do not see on her home med rec     DVT prophylaxis: per ortho Code Status: Full code Family Communication:  Disposition Plan: to be determined based on PT eval Consultants:   Ortho: Dr. Ninfa Linden  Procedures: r hip repair.   Antimicrobials:  Anti-infectives    Start     Dose/Rate Route Frequency Ordered Stop   07/28/17 2130  ceFAZolin (ANCEF) IVPB 2g/100 mL premix     2 g 200 mL/hr over 30 Minutes Intravenous Every 6 hours 07/28/17 1858 07/29/17 0337   07/28/17 1400  ceFAZolin (ANCEF) IVPB 2g/100 mL premix     2 g 200 mL/hr over 30 Minutes Intravenous On call to O.R. 07/28/17 0032 07/28/17 1525       Objective: Vitals:   07/28/17 2141 07/29/17 0450 07/29/17 1000 07/29/17 1500  BP: 135/63 (!) 129/51  (!) 119/38  Pulse: (!)  108 97  (!) 104  Resp: 16 17  16   Temp: 98.5 F (36.9 C) 98.1 F (36.7 C)  99.9 F (37.7 C)  TempSrc: Oral Oral  Oral  SpO2: 99% 99% 100% 92%    Intake/Output Summary (Last 24 hours) at 07/29/17 1835 Last data filed at 07/29/17 1700  Gross per 24 hour  Intake          1488.33 ml  Output              500 ml  Net           988.33 ml   There were no vitals filed for this visit.  Examination: General exam: Appears comfortable, in nad HEENT: PERRLA, oral mucosa moist, no sclera icterus or thrush Respiratory system: Clear to auscultation. Respiratory effort normal. Equal chest rise. Cardiovascular system: S1 & S2 heard, RRR.  No murmurs  Gastrointestinal system: Abdomen soft, non-tender, nondistended. Normal bowel sound. No organomegaly Central nervous system: Alert- and awake  No focal neurological deficits. Extremities: No cyanosis, clubbing or edema- left hip is tender Skin: No rashes or ulcers Psychiatry:  Mood & affect appropriate.     Data Reviewed: I have personally reviewed following labs and imaging studies  CBC:  Recent Labs Lab 07/27/17 0550 07/28/17 0845 07/29/17 0501  WBC 11.4* 10.0 12.0*  NEUTROABS 6.5  --   --  HGB 11.0* 10.9* 9.0*  HCT 33.9* 35.0* 28.6*  MCV 101.5* 103.2* 101.4*  PLT 255 217 294*   Basic Metabolic Panel:  Recent Labs Lab 07/27/17 0550 07/28/17 0845 07/29/17 0501  NA 139 138 134*  K 4.6 4.5 4.4  CL 100* 101 97*  CO2 30 31 26   GLUCOSE 137* 152* 302*  BUN 23* 15 17  CREATININE 1.41* 1.26* 1.29*  CALCIUM 8.9 8.8* 8.0*   GFR: CrCl cannot be calculated (Unknown ideal weight.). Liver Function Tests: No results for input(s): AST, ALT, ALKPHOS, BILITOT, PROT, ALBUMIN in the last 168 hours. No results for input(s): LIPASE, AMYLASE in the last 168 hours. No results for input(s): AMMONIA in the last 168 hours. Coagulation Profile:  Recent Labs Lab 07/27/17 0550  INR 0.90   Cardiac Enzymes: No results for input(s): CKTOTAL,  CKMB, CKMBINDEX, TROPONINI in the last 168 hours. BNP (last 3 results) No results for input(s): PROBNP in the last 8760 hours. HbA1C: No results for input(s): HGBA1C in the last 72 hours. CBG:  Recent Labs Lab 07/28/17 1652 07/28/17 2139 07/29/17 0601 07/29/17 1127 07/29/17 1708  GLUCAP 112* 195* 286* 386* 182*   Lipid Profile: No results for input(s): CHOL, HDL, LDLCALC, TRIG, CHOLHDL, LDLDIRECT in the last 72 hours. Thyroid Function Tests: No results for input(s): TSH, T4TOTAL, FREET4, T3FREE, THYROIDAB in the last 72 hours. Anemia Panel: No results for input(s): VITAMINB12, FOLATE, FERRITIN, TIBC, IRON, RETICCTPCT in the last 72 hours. Urine analysis:    Component Value Date/Time   COLORURINE STRAW (A) 07/27/2017 0829   APPEARANCEUR CLEAR 07/27/2017 0829   LABSPEC 1.009 07/27/2017 0829   PHURINE 7.0 07/27/2017 0829   GLUCOSEU NEGATIVE 07/27/2017 0829   HGBUR NEGATIVE 07/27/2017 0829   BILIRUBINUR NEGATIVE 07/27/2017 0829   BILIRUBINUR neg 08/16/2012 1531   KETONESUR 5 (A) 07/27/2017 0829   PROTEINUR NEGATIVE 07/27/2017 0829   UROBILINOGEN 0.2 06/25/2013 1002   NITRITE NEGATIVE 07/27/2017 0829   LEUKOCYTESUR NEGATIVE 07/27/2017 0829   Sepsis Labs: @LABRCNTIP (procalcitonin:4,lacticidven:4) ) Recent Results (from the past 240 hour(s))  Surgical pcr screen     Status: Abnormal   Collection Time: 07/28/17  1:17 AM  Result Value Ref Range Status   MRSA, PCR NEGATIVE NEGATIVE Final   Staphylococcus aureus POSITIVE (A) NEGATIVE Final    Comment: (NOTE) The Xpert SA Assay (FDA approved for NASAL specimens in patients 54 years of age and older), is one component of a comprehensive surveillance program. It is not intended to diagnose infection nor to guide or monitor treatment.          Radiology Studies: Dg C-arm 1-60 Min  Result Date: 07/28/2017 CLINICAL DATA:  Anterior approach right total hip arthroplasty. EXAM: DG C-ARM 61-120 MIN; OPERATIVE RIGHT HIP WITH  PELVIS COMPARISON:  Preoperative study of July 27, 2017 FINDINGS: The patient has undergone placement of a prosthetic right hip joint. Radiographic positioning of the prosthetic components is good. The interface with the native bone is normal. IMPRESSION: There is no immediate postprocedure complication following right total hip joint prosthesis placement. Electronically Signed   By: David  Martinique M.D.   On: 07/28/2017 16:40   Dg Hip Operative Unilat W Or W/o Pelvis Right  Result Date: 07/28/2017 CLINICAL DATA:  Anterior approach right total hip arthroplasty. EXAM: DG C-ARM 61-120 MIN; OPERATIVE RIGHT HIP WITH PELVIS COMPARISON:  Preoperative study of July 27, 2017 FINDINGS: The patient has undergone placement of a prosthetic right hip joint. Radiographic positioning of the prosthetic components is good.  The interface with the native bone is normal. IMPRESSION: There is no immediate postprocedure complication following right total hip joint prosthesis placement. Electronically Signed   By: David  Martinique M.D.   On: 07/28/2017 16:40      Scheduled Meds: . aspirin EC  325 mg Oral Q breakfast  . atorvastatin  10 mg Oral Daily  . dextrose  12.5 g Intravenous Once  . divalproex  750 mg Oral Daily  . docusate sodium  100 mg Oral BID  . insulin aspart  0-9 Units Subcutaneous TID WC  . insulin detemir  8 Units Subcutaneous Q2200  . magnesium oxide  400 mg Oral BID  . metoprolol succinate  25 mg Oral Daily  . multivitamin with minerals  1 tablet Oral Daily  . pantoprazole  40 mg Oral Daily   Continuous Infusions: . sodium chloride    . lactated ringers 50 mL/hr at 07/28/17 1426  . methocarbamol (ROBAXIN)  IV       LOS: 2 days    Time spent in minutes: 35    Carely Nappier, Linward Foster, MD [facility] Pager: [email or web address] Password [facility] 07/29/2017, 6:35 PM

## 2017-07-29 NOTE — Op Note (Signed)
NAMEGEAN, LAURSEN                 ACCOUNT NO.:  [id number]  MEDICAL RECORD NO.:  [id number]  LOCATION:  WA11                         FACILITY:  [facility]  PHYSICIAN:  Lind Guest. Ninfa Linden, M.D.DATE OF BIRTH:  [birth year]  DATE OF PROCEDURE:  07/28/2017 DATE OF DISCHARGE:                              OPERATIVE REPORT   PREOPERATIVE DIAGNOSIS:  Displaced right hip femoral neck fracture.  POSTOPERATIVE DIAGNOSIS:  Displaced right hip femoral neck fracture.  PROCEDURE:  Right total hip arthroplasty through direct anterior approach.  IMPLANTS:  DePuy Sector Gription acetabular component size 50, size 32 +0 polyethylene liner, size 11 Corail femoral component with standard offset, size 32 +1 metal hip ball.  SURGEON:  Lind Guest. Ninfa Linden, M.D.  ASSISTANT:  Erskine Emery, PA-C.  ANESTHESIA:  General.  ANTIBIOTICS:  2 g of IV Ancef.  BLOOD LOSS:  150 mL.  COMPLICATIONS:  None.  INDICATIONS:  [name] is a 76 year old female, who sustained a mechanical fall early yesterday morning injuring her right hip.  She was seen in the [facility] Emergency Room and found to have a displaced right hip femoral neck fracture.  She was admitted to the Medicine Service and cleared for surgery, and we have recommended a total hip arthroplasty.  She does have some slight dementia and so, we have recommended to her husband who is her healthcare power of attorney.  He understands this fully and informed consent has been given.  PROCEDURE DESCRIPTION:  After informed consent was obtained, appropriate right hip was marked.  She was brought to the operating room, general anesthesia was obtained while she was on her stretcher.  Both feet had traction boots applied to them.  Next, she was placed supine on the [place] fracture table with the perineal post in place and both legs in inline skeletal traction devices, but no traction applied.  Her right operative hip was prepped and draped with DuraPrep  and sterile drapes.  A time-out was called and she was identified as correct patient and correct right hip.  We then made an incision just inferior and posterior to the anterior superior iliac spine and carried this obliquely down the leg. We dissected down the tensor fascia lata muscle.  The tensor fascia was then divided longitudinally to proceed with a direct anterior approach to the hip.  We identified and cauterized the circumflex vessels and then identified the hip capsule.  We opened up the hip capsule in an L- type format, finding a large hematoma consistent with a femoral neck fracture and we could see she had a completely displaced femoral neck fracture.  We placed Cobra retractors around the medial and lateral femoral neck that was remnant and then removed the femoral head in its entirety.  We cleaned the acetabulum, remnants of the acetabular labrum and other debris.  I then began reaming under direct visualization from a size 42 reamer going all the way up to a size 50 with all reamers under direct visualization, the last reamer under direct fluoroscopy, so we could obtain our depth of reaming, our inclination and anteversion. Once we were pleased with this, we placed the real DePuy Sector Gription acetabular component size  50 and a 32 +0 neutral polyethylene liner for that size acetabular component.  Attention was then turned to the femur. With the leg externally rotated to 120 degrees, extended and adducted, we were to place a Mueller retractor medially and a Hohmann retractor behind the greater trochanter.  We released the lateral joint capsule and used a box cutting osteotome to enter the femoral canal and a rongeur to lateralize.  We then began broaching from a size 8 broach using the Corail broaching system going up to the size 11.  With the size 11 in place, we trialed a standard offset femoral neck and 32+ 1 hip ball.  We brought the leg back over and up with traction  and internal rotation reducing the pelvis, and we were pleased with leg length, offset, range of motion and stability.  We then dislocated the hip and removed the trial components.  We were able to place the real Corail femoral component with standard offset size 11 and the real 32 +1 metal hip ball.  We reduced this in the acetabulum and again it was stable.  We then irrigated the soft tissue with normal saline solution using pulsatile lavage.  We were able to close the joint capsule with interrupted #1 Ethibond suture followed by running #1 Vicryl in the tensor fascia, 0 Vicryl in the deep tissue, 2-0 Vicryl in the subcutaneous tissue and interrupted staples on the skin.  Xeroform and well-padded sterile dressings applied.  She was taken off the Hana table, awakened, extubated and taken to the recovery room in stable condition.  All final counts were correct.  There were no complications noted.  Of note, Erskine Emery, PA-C assisted in the entire case.  His assistance was crucial for facilitating all aspects of this case.     Lind Guest. Ninfa Linden, M.D.     CYB/MEDQ  D:  07/28/2017  T:  07/29/2017  Job:  [id number]

## 2017-07-29 NOTE — Progress Notes (Signed)
Patient ID: [name] [name], female   DOB: [birth year], 76 y.o.   MRN: [id number] Tolerated surgery on her right hip yesterday.  Vitals stable.  Acute blood loss anemia from her fracture and surgery - will follow.  Can be up with therapy with WBAT right hip and no hip precautions.  Will need skilled nursing placement following her hospital stay.

## 2017-07-29 NOTE — Discharge Instructions (Signed)

## 2017-07-29 NOTE — Therapy (Addendum)
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 Pt s/p R direct ant THA 9/25.   Clinical Impression   According to pt's husband, pt required moderate assistance with ADLs and use of a RW for functional mobility with supervision. Currently, pt requires mod assist +2 for sit to stand transfers and max assist with ADL tasks. Pt has some cognitive deficits at baseline, however, husband reports cognition and functional abilities have declined drastically since recent fall. Pt would benefit from rehab at SNF to return to PLOF. Deferring all OT needs to next venue of care.     Follow Up Recommendations  SNF;Supervision/Assistance - 24 hour    Equipment Recommendations  None recommended by OT (To be determined at next venue of care)    Recommendations for Other Services       Precautions / Restrictions Precautions Precautions: Fall Precaution Comments: no hip prec due to direct ant Restrictions Weight Bearing Restrictions: Yes RLE Weight Bearing: Weight bearing as tolerated      Mobility Bed Mobility     General bed mobility comments: Pt sitting in chair upon OT arrival.   Transfers Overall transfer level: Needs assistance Equipment used: Rolling walker (2 wheeled) Transfers: Sit to/from Stand Sit to Stand: Mod assist;+2 physical assistance         General transfer comment:     Balance  Overall balance assessment: Needs assistance Sitting-balance support: No upper extremity supported;Feet supported Sitting balance-Leahy Scale: Fair     Standing balance support: Bilateral upper extremity supported Standing balance-Leahy Scale: Poor Standing balance comment: dependent on RW and physical assist                           ADL either performed or assessed with clinical judgement   ADL Overall ADL's : Needs assistance/impaired     Grooming: Maximal assistance;Sitting;Wash/dry face;Brushing hair Grooming Details (indicate cue type and reason): With max verbal cues, pt with difficulties inititating and sequencing task.  Upper Body Bathing: Maximal assistance;Sitting   Lower Body Bathing: Maximal assistance;Sitting/lateral leans   Upper Body Dressing : Maximal assistance;Sitting   Lower Body Dressing: Maximal assistance;Sit to/from stand   Toilet Transfer: Moderate assistance;Ambulation;BSC;RW   Toileting- Clothing Manipulation and Hygiene: Maximal assistance;Sit to/from stand   Tub/ Banker: Moderate assistance;Ambulation;Rolling walker   Functional mobility during ADLs: Moderate assistance;+2 for physical assistance;Rolling walker       Vision         Perception     Praxis      Pertinent Vitals/Pain Pain Assessment: Faces Pain Score: 4  Faces Pain Scale: Hurts little more Pain Location: R hip Pain Descriptors / Indicators: Grimacing Pain Intervention(s): Monitored during session     Hand Dominance     Extremity/Trunk Assessment Upper Extremity Assessment Upper Extremity Assessment: Generalized weakness   Lower Extremity Assessment Lower Extremity Assessment: Defer to PT evaluation RLE Deficits / Details:   Cervical / Trunk Assessment Cervical / Trunk  Assessment: Kyphotic   Communication Communication Communication: HOH   Cognition Arousal/Alertness: Awake/alert Behavior During Therapy: Flat affect Overall Cognitive  Status: History of cognitive impairments - at baseline                                 General Comments: Pt presenting with confusion. Husband reports this is worse than normal. Pt unable to state name or birthday. Pt aware that she is in the hospital.   General Comments  Pt's spouse present and able to provide PLOF and medical history.     Exercises Exercises:     Shoulder Instructions      Home Living Family/patient expects to be discharged to:: Skilled nursing facility Living Arrangements: Spouse/significant other                               Additional Comments: Pt was home with spouse for 10 days but then fell.       Prior Functioning/Environment Level of Independence: Needs assistance  Gait / Transfers Assistance Needed: was using a RW ADL's / Homemaking Assistance Needed: Mod assist with bathing/dressing   Comments: pt doesn't drive or cook        OT Problem List:        OT Treatment/Interventions:      OT Goals(Current goals can be found in the care plan section) Acute Rehab OT Goals Patient Stated Goal: None stated (Spouse would like for pt to return home if possible.) OT Goal Formulation: With patient  OT Frequency:     Barriers to D/C:            Co-evaluation              AM-PAC PT "6 Clicks" Daily Activity     Outcome Measure Help from another person eating meals?: None Help from another person taking care of personal grooming?: A Lot Help from another person toileting, which includes using toliet, bedpan, or urinal?: A Lot Help from another person bathing (including washing, rinsing, drying)?: A Lot Help from another person to put on and taking off regular upper body clothing?: A Lot Help from another person to put on and taking off regular lower body clothing?: A Lot 6 Click Score: 14   End of Session Equipment Utilized During Treatment: Gait belt;Rolling walker Nurse Communication: Mobility status  Activity  Tolerance: Patient tolerated treatment well Patient left: in chair;with call bell/phone within reach;with chair alarm set;with family/visitor present  OT Visit Diagnosis: Unsteadiness on feet (R26.81);Pain Pain - Right/Left: Right Pain - part of body: Hip                Time: 0355-9741 OT Time Calculation (min): 24 min Charges:  OT General Charges $OT Visit: 1 Visit OT Evaluation $OT Eval Moderate Complexity: 1 Mod OT Treatments $Self Care/Home Management : 8-22 mins G-Codes:    Boykin Peek, OTS [phone]  Boykin Peek 07/29/2017, 1:50 PM

## 2017-07-29 NOTE — Evaluation (Signed)
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 Pt s/p R direct ant THA 9/25.  Clinical Impression  Pt admitted with above. Pt with both cognitive and functional impairments. Pt ambulation limited today by episode of passing out. RN made aware. Pt came to within 15 sec. HR 118 and SpO2 at 97% on RA. Pt was home functioning at [name] level after a 4 week stay at rehab however she now requires maxA. Recommend ST-SNF upon d/c to return to minA functional level.    Follow Up Recommendations SNF;Supervision/Assistance - 24 hour    Equipment Recommendations  None recommended by PT    Recommendations for Other Services       Precautions / Restrictions Precautions Precautions: Fall Precaution Comments: no hip prec due to direct ant Restrictions Weight Bearing Restrictions: Yes RLE Weight Bearing: Weight bearing as tolerated      Mobility  Bed Mobility Overal bed mobility: Needs Assistance Bed Mobility: Supine to Sit     Supine to sit: Mod assist;Max assist;+2 for physical assistance     General bed mobility comments: max tactile and verbal cues to complete task, pt with no initiation, assist required for trunk and LE management  Transfers Overall transfer level: Needs assistance Equipment used: Rolling walker (2 wheeled) Transfers: Sit to/from Stand Sit to Stand: Min assist;Mod assist;+2 physical  assistance         General transfer comment: max v/c's for hand placement and guidance for stance in walker. Pt initiated stepping movement, modA for walker management and to maintain steadiness  Ambulation/Gait Ambulation/Gait assistance: Min assist;+2 safety/equipment (chair follow, line management) Ambulation Distance (Feet): 5 Feet Assistive device: Rolling walker (2 wheeled) Gait Pattern/deviations: Step-to pattern;Decreased stride length;Decreased stance time - right;Decreased step length - left;Antalgic Gait velocity: slow Gait velocity interpretation: Below normal speed for age/gender General Gait Details: pt with strong R LE limp. pt then slumped and leaned into PT on the L side, pt's eyes rolled back into head, SpO2 was 97% and HR 118. pt came to in 15 sec and was laughing and smilling. Pt's spouse reports "she did this all the time at rehab"  She's been like this since her seizures. RN tech took blood surgar and it was in the 300s.  Stairs            Wheelchair Mobility    Modified Rankin (Stroke Patients Only)       Balance Overall balance assessment: Needs assistance Sitting-balance support: No upper extremity supported;Feet supported Sitting balance-Leahy Scale: Fair     Standing balance support: Bilateral upper extremity supported Standing balance-Leahy Scale: Poor Standing balance comment: dependent on RW and physical assist                             Pertinent Vitals/Pain Pain Assessment: Faces Faces Pain Scale: Hurts little more Pain Location: R hip Pain Descriptors / Indicators: Grimacing (with ROM and limping with ambulation) Pain Intervention(s):  Monitored during session    Home Living Family/patient expects to be discharged to:: Skilled nursing facility Living Arrangements: Spouse/significant other               Additional Comments: pt was home with spouse for 10 days but then fell,    Prior Function Level of Independence:  Needs assistance   Gait / Transfers Assistance Needed: was using a RW  ADL's / Homemaking Assistance Needed: assist with bathing/dressing  Comments: pt doesn't drive or cook     Hand Dominance        Extremity/Trunk Assessment   Upper Extremity Assessment Upper Extremity Assessment: Generalized weakness;Difficult to assess due to impaired cognition    Lower Extremity Assessment Lower Extremity Assessment: RLE deficits/detail;Generalized weakness;Difficult to assess due to impaired cognition RLE Deficits / Details: limited AAROM to hip due to pain, difficulty to access due to cognition    Cervical / Trunk Assessment Cervical / Trunk Assessment: Kyphotic  Communication   Communication: HOH  Cognition Arousal/Alertness: Awake/alert Behavior During Therapy: Flat affect Overall Cognitive Status: History of cognitive impairments - at baseline                                 General Comments: pt confused. husband reports this is worse than normal. Pt didnt' recognize spouse and couldn't state name but nodded no to "betty" and yes to "Darly" pt did not communicate or nod head appropriately to bith month      General Comments      Exercises     Assessment/Plan    PT Assessment Patient needs continued PT services  PT Problem List Decreased strength;Decreased range of motion;Decreased activity tolerance;Decreased balance;Decreased coordination;Decreased cognition;Decreased knowledge of use of DME;Decreased safety awareness;Decreased knowledge of precautions;Pain       PT Treatment Interventions DME instruction;Gait training;Stair training;Functional mobility training;Therapeutic activities;Therapeutic exercise;Balance training    PT Goals (Current goals can be found in the Care Plan section)  Acute Rehab PT Goals Patient Stated Goal: didn't state PT Goal Formulation: With family Time For Goal Achievement: 08/05/17 Potential to Achieve Goals: Good     Frequency Min 3X/week   Barriers to discharge Decreased caregiver support spouse unable to provide maxA    Co-evaluation               AM-PAC PT "6 Clicks" Daily Activity  Outcome Measure Difficulty turning over in bed (including adjusting bedclothes, sheets and blankets)?: Unable Difficulty moving from lying on back to sitting on the side of the bed? : Unable Difficulty sitting down on and standing up from a chair with arms (e.g., wheelchair, bedside commode, etc,.)?: A Lot Help needed moving to and from a bed to chair (including a wheelchair)?: A Lot Help needed walking in hospital room?: A Lot Help needed climbing 3-5 steps with a railing? : Total 6 Click Score: 9    End of Session Equipment Utilized During Treatment: Gait belt Activity Tolerance: Treatment limited secondary to medical complications (Comment) Patient left: in chair;with call bell/phone within reach;with nursing/sitter in room;with family/visitor present Nurse Communication: Mobility status (pt's episode of passing out) PT Visit Diagnosis: Pain;Difficulty in walking, not elsewhere classified (R26.2);History of falling (Z91.81) Pain - Right/Left: Right Pain - part of body: Hip    Time: 7782-4235 PT Time Calculation (min) (ACUTE ONLY): 18 min   Charges:   PT Evaluation $PT Eval Moderate Complexity: 1 Mod     PT G Codes:  Kittie Plater, PT, DPT Pager #: [phone] Office #: [phone]   Berline Lopes 07/29/2017, 12:49 PM

## 2017-07-29 NOTE — Progress Notes (Signed)
Orthopedic Tech Progress Note Patient Details:  [name] [name] 1941/09/05 [id number]  Patient ID: [name], female   DOB: 1941/08/20, 76 y.o.   MRN: [id number]   Hildred Priest 07/29/2017, 3:18 PM Pt unable to use trapeze bar patient helper; RN notified

## 2017-07-30 ENCOUNTER — Encounter (HOSPITAL_COMMUNITY): Payer: Self-pay | Admitting: Orthopaedic Surgery

## 2017-07-30 LAB — BASIC METABOLIC PANEL
ANION GAP: 7 (ref 5–15)
BUN: 15 mg/dL (ref 6–20)
CALCIUM: 8.3 mg/dL — AB (ref 8.9–10.3)
CO2: 30 mmol/L (ref 22–32)
CREATININE: 1.31 mg/dL — AB (ref 0.44–1.00)
Chloride: 101 mmol/L (ref 101–111)
GFR calc Af Amer: 45 mL/min — ABNORMAL LOW (ref >60)
GFR, EST NON AFRICAN AMERICAN: 38 mL/min — AB (ref >60)
GLUCOSE: 154 mg/dL — AB (ref 65–99)
Potassium: 4 mmol/L (ref 3.5–5.1)
Sodium: 138 mmol/L (ref 135–145)

## 2017-07-30 LAB — CBC
HEMATOCRIT: 26.7 % — AB (ref 36.0–46.0)
Hemoglobin: 8.4 g/dL — ABNORMAL LOW (ref 12.0–15.0)
MCH: 32.1 pg (ref 26.0–34.0)
MCHC: 31.5 g/dL (ref 30.0–36.0)
MCV: 101.9 fL — AB (ref 78.0–100.0)
PLATELETS: 149 10*3/uL — AB (ref 150–400)
RBC: 2.62 MIL/uL — ABNORMAL LOW (ref 3.87–5.11)
RDW: 15.3 % (ref 11.5–15.5)
WBC: 11.1 10*3/uL — AB (ref 4.0–10.5)

## 2017-07-30 LAB — GLUCOSE, CAPILLARY
GLUCOSE-CAPILLARY: 161 mg/dL — AB (ref 65–99)
GLUCOSE-CAPILLARY: 101 mg/dL — AB (ref 65–99)
GLUCOSE-CAPILLARY: 172 mg/dL — AB (ref 65–99)
Glucose-Capillary: 198 mg/dL — ABNORMAL HIGH (ref 65–99)

## 2017-07-30 NOTE — Progress Notes (Signed)
PTAR arrived at bedside. VS and CBG done. All belongs packed and sent with husband. Patient transported off unit

## 2017-07-30 NOTE — Clinical Social Work Placement (Signed)
   CLINICAL SOCIAL WORK PLACEMENT  NOTE  Date:  07/30/2017  Patient Details  Name: [name] [name] MRN: [id number] Date of Birth: 1941-08-05  Clinical Social Work is seeking post-discharge placement for this patient at the [place] level of care (*CSW will initial, date and re-position this form in  chart as items are completed):  Yes   Patient/family provided with [facility] Work Department's list of facilities offering this level of care within the geographic area requested by the patient (or if unable, by the patient's family).  Yes   Patient/family informed of their freedom to choose among providers that offer the needed level of care, that participate in Medicare, Medicaid or managed care program needed by the patient, have an available bed and are willing to accept the patient.  Yes   Patient/family informed of [facility]'s ownership interest in [facility] and [facility], as well as of the fact that they are under no obligation to receive care at these facilities.  PASRR submitted to EDS on 07/30/17     PASRR number received on       Existing PASRR number confirmed on 07/30/17     FL2 transmitted to all facilities in geographic area requested by pt/family on       FL2 transmitted to all facilities within larger geographic area on       Patient informed that his/her managed care company has contracts with or will negotiate with certain facilities, including the following:        Yes   Patient/family informed of bed offers received.  Patient chooses bed at [facility]     Physician recommends and patient chooses bed at      Patient to be transferred to [facility] on 07/30/17.  Patient to be transferred to facility by PTAR     Patient family notified on 07/30/17 of transfer.  Name of family member notified:  spouse at bedside, Mr. [name]     PHYSICIAN Please prepare priority discharge summary,  including medications, Please prepare prescriptions     Additional Comment:    _______________________________________________ Normajean Baxter, LCSW 07/30/2017, 4:43 PM

## 2017-07-30 NOTE — NC FL2 (Signed)
[place] LEVEL OF CARE SCREENING TOOL     IDENTIFICATION  Patient Name: [name] [name] Birthdate: 1941/04/01 Sex: female Admission Date (Current Location): 07/27/2017  [facility] and [place] Number:  Herbalist and Address:  [facility], [place] [address], [place], [place] [address]      Provider Number: [id number]  Attending Physician Name and Address:  Velvet Bathe, MD  Relative Name and Phone Number:  Mr. Tully, [place] [phone]    Current Level of Care: Hospital Recommended Level of Care: [facility] Prior Approval Number:    Date Approved/Denied: 07/30/17 PASRR Number: [id number]  Discharge Plan: SNF    Current Diagnoses: Patient Active Problem List   Diagnosis Date Noted  . Closed right hip fracture, initial encounter ([place]) 07/27/2017  . Hip fracture ([place]) 07/27/2017  . Pressure injury of skin 06/14/2017  . Encounter for palliative care   . Goals of care, counseling/discussion   . CAP (community acquired pneumonia) 06/12/2017  . Sepsis ([place]) 06/11/2017  . Weakness 06/11/2017  . Anemia 06/11/2017  . AKI (acute kidney injury) ([place]) 06/05/2017  . Sepsis, unspecified organism ([place]) 06/05/2017  . Partial symptomatic epilepsy with complex partial seizures, not intractable, without status epilepticus ([place])   . Bacteremia due to Escherichia coli 11/18/2016  . Renal insufficiency   . Aortic atherosclerosis ([place]) 11/15/2016  . UTI (urinary tract infection) 11/15/2016  . Paranoia ([place]) 05/24/2015  . Tachycardia 07/02/2013  . Hypophosphatemia 07/02/2013  . Encounter for central line placement 07/02/2013  . Hypokalemia 07/01/2013  . Hypomagnesemia 07/01/2013  . Protein-calorie malnutrition, severe ([place]) 06/29/2013  . Leukocytosis, unspecified 06/29/2013  . 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 TIA (transient ischemic attack) 12/13/2012  . 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 Syncope  09/08/2012  . Acute respiratory failure with hypoxia ([place]) 08/20/2012  . Seizure disorder, grand mal ([place]) 08/20/2012  . Seizure ([place]) 08/20/2012  . CKD (chronic kidney disease) stage 3, GFR 30-59 ml/min 08/19/2012  . Leukocytosis 08/19/2012  . Hemorrhage in the brain-13 x 22 x 14 mm right posterior temporo-occipital intra-axial acute 08/19/2012  . Left hip pain 08/19/2012  . Left hemiparesis ([place]) 08/19/2012  . Hemianopia, homonymous, left 08/19/2012  . Hemisensory deficit, left 08/19/2012  . Nausea 08/19/2012  . Headache(784.0) 08/19/2012  . Acute encephalopathy 08/19/2012  . Hemorrhage of brain, nontraumatic ([place]) 08/18/2012  . Dysphagia 08/18/2012  . HTN (hypertension) 08/18/2012  . DM type 2 (diabetes mellitus, type 2) ([place]) 07/26/2012  . Hypoglycemia due to insulin 07/26/2012  . GERD (gastroesophageal reflux disease)     Orientation RESPIRATION BLADDER Height & Weight     Self  Normal Incontinent Weight:   Height:     BEHAVIORAL SYMPTOMS/MOOD NEUROLOGICAL BOWEL NUTRITION STATUS      Continent Diet (Carb Modified, Thin)  AMBULATORY STATUS COMMUNICATION OF NEEDS Skin   Extensive Assist Non-Verbally Surgical wounds (Right Hip, Hydrocolloid Dressing)                       Personal Care Assistance Level of Assistance  Dressing, Bathing, Feeding Bathing Assistance: Maximum assistance Feeding assistance: Limited assistance Dressing Assistance: Maximum assistance     Functional Limitations Info             SPECIAL CARE FACTORS FREQUENCY  PT (By licensed PT), OT (By licensed OT)     PT Frequency: 3x week OT Frequency: 3x week  Contractures Contractures Info: Not present    Additional Factors Info  Code Status, Allergies, Insulin Sliding Scale Code Status Info: Full Code Allergies Info: BARBITURATES, LATEX    Insulin Sliding Scale Info: Insulin Daily       Current Medications (07/30/2017):  This is the current hospital active medication  list Current Facility-Administered Medications  Medication Dose Route Frequency Provider Last Rate Last Dose  . 0.9 %  sodium chloride infusion   Intravenous Continuous Mcarthur Rossetti, MD 50 mL/hr at 07/30/17 [phone]    . acetaminophen (TYLENOL) tablet 650 mg  650 mg Oral Q6H PRN Mcarthur Rossetti, MD       Or  . acetaminophen (TYLENOL) suppository 650 mg  650 mg Rectal Q6H PRN Mcarthur Rossetti, MD      . aspirin EC tablet 325 mg  325 mg Oral Q breakfast Mcarthur Rossetti, MD   325 mg at 07/30/17 1054  . atorvastatin (LIPITOR) tablet 10 mg  10 mg Oral Daily Reyne Dumas, MD   10 mg at 07/30/17 1055  . dextrose 50 % solution 12.5 g  12.5 g Intravenous Once Lillia Abed, MD      . divalproex (DEPAKOTE ER) 24 hr tablet 750 mg  750 mg Oral Daily Reyne Dumas, MD   750 mg at 07/30/17 1101  . docusate sodium (COLACE) capsule 100 mg  100 mg Oral BID Reyne Dumas, MD   100 mg at 07/30/17 1057  . HYDROcodone-acetaminophen (NORCO/VICODIN) 5-325 MG per tablet 1-2 tablet  1-2 tablet Oral Q6H PRN Mcarthur Rossetti, MD   1 tablet at 07/30/17 1055  . insulin aspart (novoLOG) injection 0-9 Units  0-9 Units Subcutaneous TID WC Schorr, Rhetta Mura, NP   2 Units at 07/30/17 0830  . insulin detemir (LEVEMIR) injection 8 Units  8 Units Subcutaneous Q2200 Reyne Dumas, MD   8 Units at 07/29/17 2108  . lactated ringers infusion   Intravenous Continuous Mcarthur Rossetti, MD 50 mL/hr at 07/28/17 1426    . magnesium oxide (MAG-OX) tablet 400 mg  400 mg Oral BID Reyne Dumas, MD   400 mg at 07/30/17 1057  . menthol-cetylpyridinium (CEPACOL) lozenge 3 mg  1 lozenge Oral PRN Mcarthur Rossetti, MD       Or  . phenol (CHLORASEPTIC) mouth spray 1 spray  1 spray Mouth/Throat PRN Mcarthur Rossetti, MD      . methocarbamol (ROBAXIN) tablet 500 mg  500 mg Oral Q6H PRN Mcarthur Rossetti, MD   500 mg at 07/30/17 1055   Or  . methocarbamol (ROBAXIN) 500 mg in dextrose 5 %  50 mL IVPB  500 mg Intravenous Q6H PRN Mcarthur Rossetti, MD      . metoCLOPramide (REGLAN) tablet 5-10 mg  5-10 mg Oral Q8H PRN Mcarthur Rossetti, MD       Or  . metoCLOPramide (REGLAN) injection 5-10 mg  5-10 mg Intravenous Q8H PRN Mcarthur Rossetti, MD      . metoprolol succinate (TOPROL-XL) 24 hr tablet 25 mg  25 mg Oral Daily Debbe Odea, MD   25 mg at 07/30/17 1055  . morphine 4 MG/ML injection 0.52 mg  0.52 mg Intravenous Q2H PRN Mcarthur Rossetti, MD      . multivitamin with minerals tablet 1 tablet  1 tablet Oral Daily Reyne Dumas, MD   1 tablet at 07/30/17 1054  . ondansetron (ZOFRAN) tablet 4 mg  4 mg Oral Q6H PRN Mcarthur Rossetti, MD  Or  . ondansetron (ZOFRAN) injection 4 mg  4 mg Intravenous Q6H PRN Mcarthur Rossetti, MD      . pantoprazole (PROTONIX) EC tablet 40 mg  40 mg Oral Daily Reyne Dumas, MD   40 mg at 07/30/17 1054  . polyethylene glycol (MIRALAX / GLYCOLAX) packet 17 g  17 g Oral Daily PRN Reyne Dumas, MD         Discharge Medications: Please see discharge summary for a list of discharge medications.  Relevant Imaging Results:  Relevant Lab Results:   Additional Information SS#: [id number]  [place], LCSW

## 2017-07-30 NOTE — Progress Notes (Signed)
Discharge instructions reviewed with pt and husband, no prescriptions, report called to [facility]. [email or web address]

## 2017-07-30 NOTE — Discharge Summary (Signed)
Physician Discharge Summary  [name] [name] WNU:[id number] DOB: 1941-01-11 DOA: 07/27/2017  PCP: [name], MD  Admit date: 07/27/2017 Discharge date: 07/30/2017  Time spent: > 35 minutes  Recommendations for Outpatient Follow-up:  1. Ensure patient follows up with Ortho   Discharge Diagnoses:  Principal Problem:   Closed right hip fracture, initial encounter ([place]) Active Problems:   GERD (gastroesophageal reflux disease)   DM type 2 (diabetes mellitus, type 2) (HCC)   HTN (hypertension)   CKD (chronic kidney disease) stage 3, GFR 30-59 ml/min   Left hemiparesis (HCC)   Seizure disorder, grand mal ([place])   Renal insufficiency   Weakness   Hip fracture ([place])   Discharge Condition: stable  Diet recommendation: Carb modified diet  There were no vitals filed for this visit.  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 Discharged from SNF about 10 days ago after admission for e coli sepsis. Fell at home and was brought to the ER at [facility]. Found to have a right hip femoral neck fracture. Ortho called and recommended tx to [facility] for [facility] today  Hospital Course:   Principal Problem:    Closed right hip fracture, initial encounter  - Dr Ninfa Linden on board and patient is s/p right hip repair - Transition to SNF for further Physical therapy  Active Problems: Anemia - follow for blood loss- Hb stable for now  Mild renal insufficiency with CKD 3 - serum creatinine stable.    GERD (gastroesophageal reflux disease) - stable continue PPI    DM type 2 (diabetes mellitus, type 2)  - cotinue Levemir -  diabetic diet.    HTN (hypertension) - continue home B blocker regimen    Procedures: 2 Days Post-Op Procedure(s) (LRB): TOTAL HIP ARTHROPLASTY ANTERIOR APPROACH (Right)  Consultations:  ortho  Discharge Exam: Vitals:   07/30/17 0613  07/30/17 1000  BP: (!) 136/49 (!) 125/50  Pulse: (!) 102 (!) 104  Resp:    Temp: 98 F (36.7 C) 98.9 F (37.2 C)  SpO2: 95% 98%    General: Pt in nad, alert and awake Cardiovascular: rrr, no rubs Respiratory: no increased wob, no wheezes  Discharge Instructions   Discharge Instructions    Call MD for:  severe uncontrolled pain    Complete by:  As directed    Call MD for:  temperature >100.4    Complete by:  As directed    Diet - low sodium heart healthy    Complete by:  As directed    Discharge instructions    Complete by:  As directed    Please ensure follow up with orthopaedic surgeon Dr. Ninfa Linden   Full weight bearing    Complete by:  As directed    Increase activity slowly    Complete by:  As directed      Current Discharge Medication List    START taking these medications   Details  aspirin EC 325 MG EC tablet Take 1 tablet (325 mg total) by mouth daily with breakfast. Qty: 30 tablet, Refills: 0      CONTINUE these medications which have CHANGED   Details  acetaminophen (TYLENOL) 325 MG tablet Take 2 tablets (650 mg total) by mouth every 6 (six) hours as needed for mild pain (or Fever >/= 101). Qty: 60 tablet, Refills: 0      CONTINUE these medications which have NOT CHANGED  Details  atorvastatin (LIPITOR) 10 MG tablet Take 10 mg by mouth daily.     divalproex (DEPAKOTE ER) 250 MG 24 hr tablet TAKE 3 TABLETS (750 MG TOTAL) BY MOUTH DAILY. Qty: 270 tablet, Refills: 0    LEVEMIR FLEXTOUCH 100 UNIT/ML Pen INJECT 10 UNITS SUBCUTANEOUSLY DAILY AT NIGHT Qty: 15 mL, Refills: 2    Magnesium 500 MG TABS Take 1 tablet (500 mg total) by mouth 2 (two) times daily.    Multiple Vitamin (MULTIVITAMIN) capsule Take 1 capsule by mouth daily.    NOVOLOG FLEXPEN 100 UNIT/ML SOPN FlexPen Inject 4-9 Units into the skin 3 (three) times daily with meals. Per sliding scale, 4 units if over 200 = 5 units in the morning and at lunch and 8 units if over 200 = 9 units.     omeprazole (PRILOSEC) 20 MG capsule Take 20 mg by mouth every morning.  Refills: 5    vitamin C (ASCORBIC ACID) 500 MG tablet Take 500 mg by mouth daily.    metoprolol succinate (TOPROL-XL) 25 MG 24 hr tablet Take 1 tablet (25 mg total) by mouth daily. Qty: 30 tablet, Refills: 0      STOP taking these medications     aspirin 81 MG tablet      cephALEXin (KEFLEX) 500 MG capsule      docusate sodium (COLACE) 100 MG capsule        Allergies  Allergen Reactions  . Barbiturates     Becomes restless.  Also with all "strong" medications like sleeping pills  . Latex Itching   Follow-up Information    Mcarthur Rossetti, MD. Schedule an appointment as soon as possible for a visit in 2 day(s).   Specialty:  Orthopedic Surgery Contact information: [address] [address] [phone]            The results of significant diagnostics from this hospitalization (including imaging, microbiology, ancillary and laboratory) are listed below for reference.    Significant Diagnostic Studies: Dg Chest 1 View  Result Date: 07/27/2017 CLINICAL DATA:  Fall tonight with right hip fracture. EXAM: CHEST 1 VIEW COMPARISON:  Radiograph 06/11/2017 FINDINGS: The cardiomediastinal contours are normal. Mild biapical pleuroparenchymal scarring. Pulmonary vasculature is normal. No consolidation, pleural effusion, or pneumothorax. No acute osseous abnormalities are seen. IMPRESSION: No acute abnormality. Electronically Signed   By: Jeb Levering M.D.   On: 07/27/2017 06:16   Dg C-arm 1-60 Min  Result Date: 07/28/2017 CLINICAL DATA:  Anterior approach right total hip arthroplasty. EXAM: DG C-ARM 61-120 MIN; OPERATIVE RIGHT HIP WITH PELVIS COMPARISON:  Preoperative study of July 27, 2017 FINDINGS: The patient has undergone placement of a prosthetic right hip joint. Radiographic positioning of the prosthetic components is good. The interface with the native bone is  normal. IMPRESSION: There is no immediate postprocedure complication following right total hip joint prosthesis placement. Electronically Signed   By: David  Martinique M.D.   On: 07/28/2017 16:40   Dg Hip Operative Unilat W Or W/o Pelvis Right  Result Date: 07/28/2017 CLINICAL DATA:  Anterior approach right total hip arthroplasty. EXAM: DG C-ARM 61-120 MIN; OPERATIVE RIGHT HIP WITH PELVIS COMPARISON:  Preoperative study of July 27, 2017 FINDINGS: The patient has undergone placement of a prosthetic right hip joint. Radiographic positioning of the prosthetic components is good. The interface with the native bone is normal. IMPRESSION: There is no immediate postprocedure complication following right total hip joint prosthesis placement. Electronically Signed   By: David  Martinique  M.D.   On: 07/28/2017 16:40   Dg Hip Unilat W Or Wo Pelvis 2-3 Views Right  Result Date: 07/27/2017 CLINICAL DATA:  Fall tonight with right hip pain. EXAM: DG HIP (WITH OR WITHOUT PELVIS) 2-3V RIGHT COMPARISON:  None. FINDINGS: Displaced right femoral neck fracture with minimal proximal migration of the femoral shaft. Femoral head remains seated. Remainder the bony pelvis is intact without additional fracture. Pubic symphysis and sacroiliac joints are congruent. IMPRESSION: Mildly displaced right femoral neck fracture. Electronically Signed   By: Jeb Levering M.D.   On: 07/27/2017 06:14    Microbiology: Recent Results (from the past 240 hour(s))  Surgical pcr screen     Status: Abnormal   Collection Time: 07/28/17  1:17 AM  Result Value Ref Range Status   MRSA, PCR NEGATIVE NEGATIVE Final   Staphylococcus aureus POSITIVE (A) NEGATIVE Final    Comment: (NOTE) The Xpert SA Assay (FDA approved for NASAL specimens in patients 69 years of age and older), is one component of a comprehensive surveillance program. It is not intended to diagnose infection nor to guide or monitor treatment.      Labs: Basic Metabolic  Panel:  Recent Labs Lab 07/27/17 0550 07/28/17 0845 07/29/17 0501 07/30/17 0604  NA 139 138 134* 138  K 4.6 4.5 4.4 4.0  CL 100* 101 97* 101  CO2 30 31 26 30   GLUCOSE 137* 152* 302* 154*  BUN 23* 15 17 15   CREATININE 1.41* 1.26* 1.29* 1.31*  CALCIUM 8.9 8.8* 8.0* 8.3*   Liver Function Tests: No results for input(s): AST, ALT, ALKPHOS, BILITOT, PROT, ALBUMIN in the last 168 hours. No results for input(s): LIPASE, AMYLASE in the last 168 hours. No results for input(s): AMMONIA in the last 168 hours. CBC:  Recent Labs Lab 07/27/17 0550 07/28/17 0845 07/29/17 0501 07/30/17 0604  WBC 11.4* 10.0 12.0* 11.1*  NEUTROABS 6.5  --   --   --   HGB 11.0* 10.9* 9.0* 8.4*  HCT 33.9* 35.0* 28.6* 26.7*  MCV 101.5* 103.2* 101.4* 101.9*  PLT 255 217 139* 149*   Cardiac Enzymes: No results for input(s): CKTOTAL, CKMB, CKMBINDEX, TROPONINI in the last 168 hours. BNP: BNP (last 3 results) No results for input(s): BNP in the last 8760 hours.  ProBNP (last 3 results) No results for input(s): PROBNP in the last 8760 hours.  CBG:  Recent Labs Lab 07/29/17 1127 07/29/17 1708 07/29/17 2119 07/30/17 0617 07/30/17 1147  GLUCAP 386* 182* 200* 161* 198*    Signed:  Velvet Bathe MD.  [facility] 07/30/2017, 1:14 PM

## 2017-07-30 NOTE — Social Work (Signed)
CSW spoke with patient and he selected [facility].  CSW will assist with discharge.  Elissa Hefty, LCSW Clinical Social Worker [phone]

## 2017-07-30 NOTE — Clinical Social Work Note (Signed)
Clinical Social Work Assessment  Patient Details  Name: [name] [name] MRN: [id number] Date of Birth: Oct 09, 1941  Date of referral:  07/30/17               Reason for consult:  Facility Placement                Permission sought to share information with:  Facility Art therapist granted to share information::  Yes, Verbal Permission Granted  Name::     Mr. Loera  Agency::  SNF  Relationship::  spouse  Contact Information:     Housing/Transportation Living arrangements for the past 2 months:  Single Family Home Source of Information:  Spouse Patient Interpreter Needed:  None Criminal Activity/Legal Involvement Pertinent to Current Situation/Hospitalization:  No - Comment as needed Significant Relationships:  Adult Children, Spouse Lives with:  Spouse Do you feel safe going back to the place where you live?  No Need for family participation in patient care:  Yes (Comment)  Care giving concerns:  Pt from home after a fall. Pt was in a SNF for about 4 weeks for sepsis and weakness and was only home two weeks before the impairment. Prior to initial hospitalization, patient needed assistance with ADL's from spouse. She didn't have a wheelchair and was less ambulatory. Spouse indicated that they may not be able to afford the co-pay days of the medicare at this time, but may have to consider if patient needs more care than he can provide.  Social Worker assessment / plan:  CSW met with patient and spouse at bedside. Patient and spouse familiar with SNF process as he in a SNF about two weeks ago. CSW validated and discussed her role. CSW discussed the insurance and spouse not sure if they can afford. CSW obtained permission to send out to SNF's any way to see if they can offer and obtain the actual amount of the co-pay per day.  FL2 completed. Offers to be sent. Passr confirmed.  Employment status:  Retired Forensic scientist:  [employer] PT Recommendations:  [place] / Referral to community resources:  [place]  Patient/Family's Response to care:  Psychologist, prison and probation services of CSW assistance with SNF. No issues or concerns identified at this time.  Patient/Family's Understanding of and Emotional Response to Diagnosis, Current Treatment, and Prognosis:  Spouse has good understanding of diagnosis, current treatment and prognosis and spouse hopeful that patient will begin to improve to come home. Spouse and patient may not be able to afford to keep patient in a SNF for long term as they will have to pay for co-pay days in SNF. CSW will continue to follow-up for bed offers and discuss with patient spouse.  Emotional Assessment Appearance:  Appears stated age Attitude/Demeanor/Rapport:  Unable to Assess, Other (Quiet) Affect (typically observed):  Accepting, Appropriate, Quiet Orientation:  Oriented to Self Alcohol / Substance use:  Not Applicable Psych involvement (Current and /or in the community):  No (Comment)  Discharge Needs  Concerns to be addressed:  Care Coordination Readmission within the last 30 days:  Yes Current discharge risk:  Physical Impairment, Dependent with Mobility Barriers to Discharge:  No Barriers Identified   Normajean Baxter, LCSW 07/30/2017, 12:28 PM

## 2017-07-30 NOTE — Social Work (Signed)
Clinical Social Worker facilitated patient discharge including contacting patient family and facility to confirm patient discharge plans.  Clinical information faxed to facility and family agreeable with plan.    CSW arranged ambulance transport via [facility] to [facility].    RN to call [phone] to give report prior to discharge.   Clinical Social Worker will sign off for now as social work intervention is no longer needed. Please consult [place] again if new need arises.  Elissa Hefty, LCSW Clinical Social Worker [phone]

## 2017-07-30 NOTE — Progress Notes (Signed)
Subjective: 2 Days Post-Op Procedure(s) (LRB): TOTAL HIP ARTHROPLASTY ANTERIOR APPROACH (Right) Patient reports pain as moderate.  Follows commands appropriately.  Objective: Vital signs in last 24 hours: Temp:  [98 F (36.7 C)-99.9 F (37.7 C)] 98 F (36.7 C) (09/27 1856) Pulse Rate:  [102-105] 102 (09/27 0613) Resp:  [16] 16 (09/26 1500) BP: (112-136)/(37-49) 136/49 (09/27 0613) SpO2:  [92 %-100 %] 95 % (09/27 3149)  Intake/Output from previous day: 09/26 0701 - 09/27 0700 In: 910 [P.O.:360; I.V.:550] Out: -  Intake/Output this shift: No intake/output data recorded.   Recent Labs  07/28/17 0845 07/29/17 0501 07/30/17 0604  HGB 10.9* 9.0* 8.4*    Recent Labs  07/29/17 0501 07/30/17 0604  WBC 12.0* 11.1*  RBC 2.82* 2.62*  HCT 28.6* 26.7*  PLT 139* 149*    Recent Labs  07/29/17 0501 07/30/17 0604  NA 134* 138  K 4.4 4.0  CL 97* 101  CO2 26 30  BUN 17 15  CREATININE 1.29* 1.31*  GLUCOSE 302* 154*  CALCIUM 8.0* 8.3*   No results for input(s): LABPT, INR in the last 72 hours.  Intact pulses distally Dorsiflexion/Plantar flexion intact Incision: scant drainage No cellulitis present Compartment soft  Assessment/Plan: 2 Days Post-Op Procedure(s) (LRB): TOTAL HIP ARTHROPLASTY ANTERIOR APPROACH (Right) Up with therapy - WBAT with assistance Can go to skilled nursing from ortho standpoint  Mcarthur Rossetti 07/30/2017, 9:36 AM

## 2017-07-31 MED FILL — Morphine Sulfate Inj 4 MG/ML: INTRAMUSCULAR | Qty: 1 | Status: AC

## 2017-08-10 ENCOUNTER — Ambulatory Visit (INDEPENDENT_AMBULATORY_CARE_PROVIDER_SITE_OTHER): Payer: Federal, State, Local not specified - PPO | Admitting: Orthopaedic Surgery

## 2017-08-10 DIAGNOSIS — Z96641 Presence of right artificial hip joint: Secondary | ICD-10-CM

## 2017-08-10 DIAGNOSIS — S72001D Fracture of unspecified part of neck of right femur, subsequent encounter for closed fracture with routine healing: Secondary | ICD-10-CM

## 2017-08-10 MED ORDER — ONDANSETRON 4 MG PO TBDP: 4.0000 mg | ORAL_TABLET | Freq: Three times a day (TID) | ORAL | 0 refills | Status: DC | PRN

## 2017-08-10 MED ORDER — TRAMADOL HCL 50 MG PO TABS: 50.0000 mg | ORAL_TABLET | Freq: Four times a day (QID) | ORAL | 0 refills | Status: DC | PRN

## 2017-08-10 NOTE — Progress Notes (Signed)
The patient is following up at 2 weeks tomorrow status post a right total hip arthroplasty to treat a right hip fracture. She stated a skilled nursing facility for only a few days and has been took her home after that. She is only on Tylenol for pain and said the pain is quite significant but she also has nausea with pain medications. She is mainly in a wheelchair.  On exam her incision on scheduler moves staples and placed Steri-Strips on the right side. Her incision looks good overall. There is no evidence infection. There is no redness and no significant seroma. Her leg lengths are equal.  At this point she'll continue increase her activities and apparently home health is coming soon to work with balance gait training and coordination. I did give her prescription for tramadol and Zofran. We'll see her back in 4 weeks to see how she is doing overall. I would like an AP pelvis at that visit.

## 2017-08-26 ENCOUNTER — Inpatient Hospital Stay (HOSPITAL_COMMUNITY)
Admission: EM | Admit: 2017-08-26 | Discharge: 2017-09-11 | DRG: 853 | Disposition: A | Discharge status: Hospice | Payer: Medicare Other | Attending: Internal Medicine | Admitting: Internal Medicine

## 2017-08-26 ENCOUNTER — Emergency Department (HOSPITAL_COMMUNITY): Payer: Medicare Other

## 2017-08-26 ENCOUNTER — Encounter (HOSPITAL_COMMUNITY): Payer: Self-pay | Admitting: Emergency Medicine

## 2017-08-26 ENCOUNTER — Inpatient Hospital Stay (HOSPITAL_COMMUNITY): Payer: Medicare Other

## 2017-08-26 DIAGNOSIS — E1121 Type 2 diabetes mellitus with diabetic nephropathy: Secondary | ICD-10-CM | POA: Diagnosis not present

## 2017-08-26 DIAGNOSIS — Z794 Long term (current) use of insulin: Secondary | ICD-10-CM

## 2017-08-26 DIAGNOSIS — E1140 Type 2 diabetes mellitus with diabetic neuropathy, unspecified: Secondary | ICD-10-CM | POA: Diagnosis not present

## 2017-08-26 DIAGNOSIS — G40909 Epilepsy, unspecified, not intractable, without status epilepticus: Secondary | ICD-10-CM | POA: Diagnosis not present

## 2017-08-26 DIAGNOSIS — Y834 Other reconstructive surgery as the cause of abnormal reaction of the patient, or of later complication, without mention of misadventure at the time of the procedure: Secondary | ICD-10-CM | POA: Diagnosis present

## 2017-08-26 DIAGNOSIS — D539 Nutritional anemia, unspecified: Secondary | ICD-10-CM | POA: Diagnosis present

## 2017-08-26 DIAGNOSIS — R569 Unspecified convulsions: Secondary | ICD-10-CM | POA: Diagnosis present

## 2017-08-26 DIAGNOSIS — G40409 Other generalized epilepsy and epileptic syndromes, not intractable, without status epilepticus: Secondary | ICD-10-CM | POA: Diagnosis not present

## 2017-08-26 DIAGNOSIS — E876 Hypokalemia: Secondary | ICD-10-CM | POA: Diagnosis not present

## 2017-08-26 DIAGNOSIS — G92 Toxic encephalopathy: Secondary | ICD-10-CM | POA: Diagnosis present

## 2017-08-26 DIAGNOSIS — T8141XA Infection following a procedure, superficial incisional surgical site, initial encounter: Secondary | ICD-10-CM | POA: Diagnosis not present

## 2017-08-26 DIAGNOSIS — N390 Urinary tract infection, site not specified: Secondary | ICD-10-CM | POA: Diagnosis present

## 2017-08-26 DIAGNOSIS — E7220 Disorder of urea cycle metabolism, unspecified: Secondary | ICD-10-CM | POA: Diagnosis present

## 2017-08-26 DIAGNOSIS — I4892 Unspecified atrial flutter: Secondary | ICD-10-CM | POA: Diagnosis present

## 2017-08-26 DIAGNOSIS — L03115 Cellulitis of right lower limb: Secondary | ICD-10-CM | POA: Diagnosis present

## 2017-08-26 DIAGNOSIS — I10 Essential (primary) hypertension: Secondary | ICD-10-CM

## 2017-08-26 DIAGNOSIS — Z7401 Bed confinement status: Secondary | ICD-10-CM

## 2017-08-26 DIAGNOSIS — N183 Chronic kidney disease, stage 3 unspecified: Secondary | ICD-10-CM | POA: Diagnosis present

## 2017-08-26 DIAGNOSIS — I4510 Unspecified right bundle-branch block: Secondary | ICD-10-CM | POA: Diagnosis present

## 2017-08-26 DIAGNOSIS — E1152 Type 2 diabetes mellitus with diabetic peripheral angiopathy with gangrene: Secondary | ICD-10-CM | POA: Diagnosis present

## 2017-08-26 DIAGNOSIS — E160 Drug-induced hypoglycemia without coma: Secondary | ICD-10-CM | POA: Diagnosis not present

## 2017-08-26 DIAGNOSIS — I4430 Unspecified atrioventricular block: Secondary | ICD-10-CM | POA: Diagnosis present

## 2017-08-26 DIAGNOSIS — T383X5A Adverse effect of insulin and oral hypoglycemic [antidiabetic] drugs, initial encounter: Secondary | ICD-10-CM

## 2017-08-26 DIAGNOSIS — I5032 Chronic diastolic (congestive) heart failure: Secondary | ICD-10-CM | POA: Diagnosis present

## 2017-08-26 DIAGNOSIS — Z7189 Other specified counseling: Secondary | ICD-10-CM | POA: Diagnosis not present

## 2017-08-26 DIAGNOSIS — M19041 Primary osteoarthritis, right hand: Secondary | ICD-10-CM | POA: Diagnosis present

## 2017-08-26 DIAGNOSIS — E1122 Type 2 diabetes mellitus with diabetic chronic kidney disease: Secondary | ICD-10-CM | POA: Diagnosis present

## 2017-08-26 DIAGNOSIS — R0989 Other specified symptoms and signs involving the circulatory and respiratory systems: Secondary | ICD-10-CM | POA: Diagnosis not present

## 2017-08-26 DIAGNOSIS — R5081 Fever presenting with conditions classified elsewhere: Secondary | ICD-10-CM | POA: Diagnosis not present

## 2017-08-26 DIAGNOSIS — G9340 Encephalopathy, unspecified: Secondary | ICD-10-CM | POA: Diagnosis not present

## 2017-08-26 DIAGNOSIS — Z6821 Body mass index (BMI) 21.0-21.9, adult: Secondary | ICD-10-CM

## 2017-08-26 DIAGNOSIS — G40901 Epilepsy, unspecified, not intractable, with status epilepticus: Secondary | ICD-10-CM | POA: Diagnosis present

## 2017-08-26 DIAGNOSIS — T8149XA Infection following a procedure, other surgical site, initial encounter: Secondary | ICD-10-CM

## 2017-08-26 DIAGNOSIS — R6520 Severe sepsis without septic shock: Secondary | ICD-10-CM | POA: Diagnosis present

## 2017-08-26 DIAGNOSIS — R509 Fever, unspecified: Secondary | ICD-10-CM | POA: Diagnosis not present

## 2017-08-26 DIAGNOSIS — I130 Hypertensive heart and chronic kidney disease with heart failure and stage 1 through stage 4 chronic kidney disease, or unspecified chronic kidney disease: Secondary | ICD-10-CM | POA: Diagnosis present

## 2017-08-26 DIAGNOSIS — R531 Weakness: Secondary | ICD-10-CM

## 2017-08-26 DIAGNOSIS — N3000 Acute cystitis without hematuria: Secondary | ICD-10-CM | POA: Diagnosis not present

## 2017-08-26 DIAGNOSIS — Z5329 Procedure and treatment not carried out because of patient's decision for other reasons: Secondary | ICD-10-CM | POA: Diagnosis not present

## 2017-08-26 DIAGNOSIS — Z96641 Presence of right artificial hip joint: Secondary | ICD-10-CM | POA: Diagnosis present

## 2017-08-26 DIAGNOSIS — E43 Unspecified severe protein-calorie malnutrition: Secondary | ICD-10-CM | POA: Diagnosis present

## 2017-08-26 DIAGNOSIS — R1310 Dysphagia, unspecified: Secondary | ICD-10-CM | POA: Diagnosis present

## 2017-08-26 DIAGNOSIS — Z515 Encounter for palliative care: Secondary | ICD-10-CM | POA: Diagnosis not present

## 2017-08-26 DIAGNOSIS — K219 Gastro-esophageal reflux disease without esophagitis: Secondary | ICD-10-CM | POA: Diagnosis present

## 2017-08-26 DIAGNOSIS — D72829 Elevated white blood cell count, unspecified: Secondary | ICD-10-CM | POA: Diagnosis not present

## 2017-08-26 DIAGNOSIS — R40242 Glasgow coma scale score 9-12, unspecified time: Secondary | ICD-10-CM | POA: Diagnosis present

## 2017-08-26 DIAGNOSIS — R4701 Aphasia: Secondary | ICD-10-CM | POA: Diagnosis present

## 2017-08-26 DIAGNOSIS — R1312 Dysphagia, oropharyngeal phase: Secondary | ICD-10-CM

## 2017-08-26 DIAGNOSIS — I2510 Atherosclerotic heart disease of native coronary artery without angina pectoris: Secondary | ICD-10-CM | POA: Diagnosis present

## 2017-08-26 DIAGNOSIS — L080 Pyoderma: Secondary | ICD-10-CM | POA: Diagnosis not present

## 2017-08-26 DIAGNOSIS — Z8673 Personal history of transient ischemic attack (TIA), and cerebral infarction without residual deficits: Secondary | ICD-10-CM

## 2017-08-26 DIAGNOSIS — A419 Sepsis, unspecified organism: Secondary | ICD-10-CM | POA: Diagnosis present

## 2017-08-26 DIAGNOSIS — M19042 Primary osteoarthritis, left hand: Secondary | ICD-10-CM | POA: Diagnosis present

## 2017-08-26 DIAGNOSIS — Z885 Allergy status to narcotic agent status: Secondary | ICD-10-CM

## 2017-08-26 DIAGNOSIS — L7634 Postprocedural seroma of skin and subcutaneous tissue following other procedure: Secondary | ICD-10-CM | POA: Diagnosis present

## 2017-08-26 DIAGNOSIS — E118 Type 2 diabetes mellitus with unspecified complications: Secondary | ICD-10-CM

## 2017-08-26 DIAGNOSIS — M549 Dorsalgia, unspecified: Secondary | ICD-10-CM | POA: Diagnosis present

## 2017-08-26 DIAGNOSIS — E11649 Type 2 diabetes mellitus with hypoglycemia without coma: Secondary | ICD-10-CM | POA: Diagnosis present

## 2017-08-26 DIAGNOSIS — I493 Ventricular premature depolarization: Secondary | ICD-10-CM | POA: Diagnosis present

## 2017-08-26 DIAGNOSIS — R51 Headache: Secondary | ICD-10-CM | POA: Diagnosis present

## 2017-08-26 DIAGNOSIS — I272 Pulmonary hypertension, unspecified: Secondary | ICD-10-CM | POA: Diagnosis not present

## 2017-08-26 DIAGNOSIS — R5381 Other malaise: Secondary | ICD-10-CM | POA: Diagnosis present

## 2017-08-26 DIAGNOSIS — Z91040 Latex allergy status: Secondary | ICD-10-CM

## 2017-08-26 DIAGNOSIS — R197 Diarrhea, unspecified: Secondary | ICD-10-CM | POA: Diagnosis not present

## 2017-08-26 DIAGNOSIS — Z7982 Long term (current) use of aspirin: Secondary | ICD-10-CM

## 2017-08-26 DIAGNOSIS — B9689 Other specified bacterial agents as the cause of diseases classified elsewhere: Secondary | ICD-10-CM | POA: Diagnosis present

## 2017-08-26 DIAGNOSIS — R000 Tachycardia, unspecified: Secondary | ICD-10-CM

## 2017-08-26 DIAGNOSIS — B9562 Methicillin resistant Staphylococcus aureus infection as the cause of diseases classified elsewhere: Secondary | ICD-10-CM | POA: Diagnosis present

## 2017-08-26 DIAGNOSIS — I739 Peripheral vascular disease, unspecified: Secondary | ICD-10-CM | POA: Diagnosis present

## 2017-08-26 DIAGNOSIS — Z66 Do not resuscitate: Secondary | ICD-10-CM | POA: Diagnosis not present

## 2017-08-26 DIAGNOSIS — E785 Hyperlipidemia, unspecified: Secondary | ICD-10-CM | POA: Diagnosis present

## 2017-08-26 DIAGNOSIS — S72001A Fracture of unspecified part of neck of right femur, initial encounter for closed fracture: Secondary | ICD-10-CM | POA: Diagnosis not present

## 2017-08-26 DIAGNOSIS — Z79899 Other long term (current) drug therapy: Secondary | ICD-10-CM

## 2017-08-26 DIAGNOSIS — Z8781 Personal history of (healed) traumatic fracture: Secondary | ICD-10-CM

## 2017-08-26 DIAGNOSIS — G8929 Other chronic pain: Secondary | ICD-10-CM | POA: Diagnosis present

## 2017-08-26 DIAGNOSIS — R2689 Other abnormalities of gait and mobility: Secondary | ICD-10-CM | POA: Diagnosis present

## 2017-08-26 LAB — CBC WITH DIFFERENTIAL/PLATELET
BASOS ABS: 0 10*3/uL (ref 0.0–0.1)
Basophils Relative: 0 %
EOS PCT: 0 %
Eosinophils Absolute: 0 10*3/uL (ref 0.0–0.7)
HEMATOCRIT: 38.6 % (ref 36.0–46.0)
HEMOGLOBIN: 12 g/dL (ref 12.0–15.0)
LYMPHS ABS: 2.6 10*3/uL (ref 0.7–4.0)
LYMPHS PCT: 13 %
MCH: 31.9 pg (ref 26.0–34.0)
MCHC: 31.1 g/dL (ref 30.0–36.0)
MCV: 102.7 fL — ABNORMAL HIGH (ref 78.0–100.0)
Monocytes Absolute: 1.4 10*3/uL — ABNORMAL HIGH (ref 0.1–1.0)
Monocytes Relative: 7 %
NEUTROS ABS: 16.5 10*3/uL — AB (ref 1.7–7.7)
Neutrophils Relative %: 80 %
Platelets: 241 10*3/uL (ref 150–400)
RBC: 3.76 MIL/uL — AB (ref 3.87–5.11)
RDW: 15.2 % (ref 11.5–15.5)
WBC: 20.5 10*3/uL — AB (ref 4.0–10.5)

## 2017-08-26 LAB — URINALYSIS, ROUTINE W REFLEX MICROSCOPIC
Bilirubin Urine: NEGATIVE
Glucose, UA: 50 mg/dL — AB
Hgb urine dipstick: NEGATIVE
Ketones, ur: 5 mg/dL — AB
Nitrite: NEGATIVE
Protein, ur: 30 mg/dL — AB
Specific Gravity, Urine: 1.015 (ref 1.005–1.030)
Squamous Epithelial / HPF: NONE SEEN
pH: 6 (ref 5.0–8.0)

## 2017-08-26 LAB — I-STAT CHEM 8, ED
BUN: 27 mg/dL — ABNORMAL HIGH (ref 6–20)
Calcium, Ion: 1.09 mmol/L — ABNORMAL LOW (ref 1.15–1.40)
Chloride: 92 mmol/L — ABNORMAL LOW (ref 101–111)
Creatinine, Ser: 1.8 mg/dL — ABNORMAL HIGH (ref 0.44–1.00)
Glucose, Bld: 268 mg/dL — ABNORMAL HIGH (ref 65–99)
HCT: 41 % (ref 36.0–46.0)
Hemoglobin: 13.9 g/dL (ref 12.0–15.0)
Potassium: 3.9 mmol/L (ref 3.5–5.1)
Sodium: 135 mmol/L (ref 135–145)
TCO2: 28 mmol/L (ref 22–32)

## 2017-08-26 LAB — CK: CK TOTAL: 204 U/L (ref 38–234)

## 2017-08-26 LAB — COMPREHENSIVE METABOLIC PANEL WITH GFR
ALT: 11 U/L — ABNORMAL LOW (ref 14–54)
AST: 30 U/L (ref 15–41)
Albumin: 3.2 g/dL — ABNORMAL LOW (ref 3.5–5.0)
Alkaline Phosphatase: 165 U/L — ABNORMAL HIGH (ref 38–126)
Anion gap: 18 — ABNORMAL HIGH (ref 5–15)
BUN: 25 mg/dL — ABNORMAL HIGH (ref 6–20)
CO2: 26 mmol/L (ref 22–32)
Calcium: 9.1 mg/dL (ref 8.9–10.3)
Chloride: 89 mmol/L — ABNORMAL LOW (ref 101–111)
Creatinine, Ser: 1.92 mg/dL — ABNORMAL HIGH (ref 0.44–1.00)
GFR calc Af Amer: 28 mL/min — ABNORMAL LOW
GFR calc non Af Amer: 24 mL/min — ABNORMAL LOW
Glucose, Bld: 273 mg/dL — ABNORMAL HIGH (ref 65–99)
Potassium: 3.8 mmol/L (ref 3.5–5.1)
Sodium: 133 mmol/L — ABNORMAL LOW (ref 135–145)
Total Bilirubin: 0.7 mg/dL (ref 0.3–1.2)
Total Protein: 7.3 g/dL (ref 6.5–8.1)

## 2017-08-26 LAB — I-STAT CG4 LACTIC ACID, ED
Lactic Acid, Venous: 5.59 mmol/L (ref 0.5–1.9)
Lactic Acid, Venous: 4.4 mmol/L (ref 0.5–1.9)

## 2017-08-26 LAB — AMMONIA: AMMONIA: 24 umol/L (ref 9–35)

## 2017-08-26 LAB — HEMOGLOBIN A1C
HEMOGLOBIN A1C: 6.5 % — AB (ref 4.8–5.6)
Mean Plasma Glucose: 139.85 mg/dL

## 2017-08-26 LAB — APTT: aPTT: 31 seconds (ref 24–36)

## 2017-08-26 LAB — PROTIME-INR
INR: 1.11
Prothrombin Time: 14.3 seconds (ref 11.4–15.2)

## 2017-08-26 LAB — INFLUENZA PANEL BY PCR (TYPE A & B)
INFLAPCR: NEGATIVE
INFLBPCR: NEGATIVE

## 2017-08-26 LAB — PHOSPHORUS: Phosphorus: 2.1 mg/dL — ABNORMAL LOW (ref 2.5–4.6)

## 2017-08-26 LAB — MAGNESIUM: Magnesium: 1.9 mg/dL (ref 1.7–2.4)

## 2017-08-26 LAB — LACTIC ACID, PLASMA
Lactic Acid, Venous: 2.9 mmol/L (ref 0.5–1.9)
Lactic Acid, Venous: 1.9 mmol/L (ref 0.5–1.9)

## 2017-08-26 LAB — TROPONIN I: Troponin I: 0.03 ng/mL (ref <0.03)

## 2017-08-26 LAB — CBG MONITORING, ED
GLUCOSE-CAPILLARY: 236 mg/dL — AB (ref 65–99)
GLUCOSE-CAPILLARY: 149 mg/dL — AB (ref 65–99)
GLUCOSE-CAPILLARY: 105 mg/dL — AB (ref 65–99)
Glucose-Capillary: 125 mg/dL — ABNORMAL HIGH (ref 65–99)

## 2017-08-26 LAB — PROCALCITONIN: Procalcitonin: 11.7 ng/mL

## 2017-08-26 LAB — VALPROIC ACID LEVEL: VALPROIC ACID LVL: 71 ug/mL (ref 50.0–100.0)

## 2017-08-26 MED ORDER — ATORVASTATIN CALCIUM 10 MG PO TABS
10.0000 mg | ORAL_TABLET | Freq: Every day | ORAL | Status: DC
  Administered 2017-09-03 – 2017-09-04 (×2): 10 mg via ORAL
  Filled 2017-08-26 (×5): qty 1

## 2017-08-26 MED ORDER — ACETAMINOPHEN 500 MG PO TABS: 1000.0000 mg | ORAL_TABLET | Freq: Four times a day (QID) | ORAL | Status: DC | PRN

## 2017-08-26 MED ORDER — SODIUM CHLORIDE 0.9 % IV BOLUS (SEPSIS)
1000.0000 mL | Freq: Once | INTRAVENOUS | Status: AC
  Administered 2017-08-26: 1000 mL via INTRAVENOUS

## 2017-08-26 MED ORDER — SODIUM CHLORIDE 0.45 % IV SOLN
INTRAVENOUS | Status: DC
  Administered 2017-08-26: 12:00:00 via INTRAVENOUS

## 2017-08-26 MED ORDER — PANTOPRAZOLE SODIUM 40 MG PO TBEC: 40.0000 mg | DELAYED_RELEASE_TABLET | Freq: Every day | ORAL | Status: DC

## 2017-08-26 MED ORDER — PIPERACILLIN-TAZOBACTAM 3.375 G IVPB 30 MIN
3.3750 g | Freq: Once | INTRAVENOUS | Status: AC
  Administered 2017-08-26: 3.375 g via INTRAVENOUS
  Filled 2017-08-26: qty 50

## 2017-08-26 MED ORDER — DEXTROSE 5 % IV SOLN
2.0000 g | Freq: Once | INTRAVENOUS | Status: AC
  Administered 2017-08-26: 2 g via INTRAVENOUS
  Filled 2017-08-26: qty 2

## 2017-08-26 MED ORDER — SODIUM CHLORIDE 0.9 % IV SOLN
600.0000 mg | Freq: Four times a day (QID) | INTRAVENOUS | Status: DC | PRN
  Filled 2017-08-26: qty 6

## 2017-08-26 MED ORDER — IBUPROFEN 600 MG PO TABS: 600.0000 mg | ORAL_TABLET | Freq: Four times a day (QID) | ORAL | Status: DC | PRN

## 2017-08-26 MED ORDER — DIVALPROEX SODIUM ER 500 MG PO TB24
750.0000 mg | ORAL_TABLET | Freq: Every day | ORAL | Status: DC
  Filled 2017-08-26 (×2): qty 1

## 2017-08-26 MED ORDER — VANCOMYCIN HCL IN DEXTROSE 1-5 GM/200ML-% IV SOLN
1000.0000 mg | Freq: Once | INTRAVENOUS | Status: AC
  Administered 2017-08-26: 1000 mg via INTRAVENOUS
  Filled 2017-08-26: qty 200

## 2017-08-26 MED ORDER — ONDANSETRON HCL 4 MG PO TABS
4.0000 mg | ORAL_TABLET | Freq: Four times a day (QID) | ORAL | Status: DC | PRN
Start: 2017-08-26 — End: 2017-09-04

## 2017-08-26 MED ORDER — LORAZEPAM 2 MG/ML IJ SOLN
INTRAMUSCULAR | Status: AC
  Administered 2017-08-26: 1 mg
  Filled 2017-08-26: qty 1

## 2017-08-26 MED ORDER — SODIUM CHLORIDE 0.9 % IV SOLN
500.0000 mg | Freq: Once | INTRAVENOUS | Status: AC
  Administered 2017-08-26: 500 mg via INTRAVENOUS
  Filled 2017-08-26: qty 5

## 2017-08-26 MED ORDER — SODIUM CHLORIDE 0.9 % IV SOLN
1000.0000 mg | Freq: Once | INTRAVENOUS | Status: AC
  Administered 2017-08-26: 1000 mg via INTRAVENOUS
  Filled 2017-08-26: qty 10

## 2017-08-26 MED ORDER — ACETAMINOPHEN 650 MG RE SUPP
650.0000 mg | Freq: Four times a day (QID) | RECTAL | Status: DC | PRN
  Administered 2017-08-26 – 2017-08-27 (×2): 650 mg via RECTAL
  Filled 2017-08-26 (×2): qty 1

## 2017-08-26 MED ORDER — SODIUM CHLORIDE 0.9 % IV SOLN: 75.0000 mL/h | INTRAVENOUS | Status: DC

## 2017-08-26 MED ORDER — ONDANSETRON HCL 4 MG/2ML IJ SOLN: 4.0000 mg | Freq: Four times a day (QID) | INTRAMUSCULAR | Status: DC | PRN

## 2017-08-26 MED ORDER — METOPROLOL TARTRATE 5 MG/5ML IV SOLN
5.0000 mg | Freq: Four times a day (QID) | INTRAVENOUS | Status: DC | PRN
  Administered 2017-08-26: 5 mg via INTRAVENOUS
  Filled 2017-08-26 (×2): qty 5

## 2017-08-26 MED ORDER — INSULIN DETEMIR 100 UNIT/ML ~~LOC~~ SOLN
8.0000 [IU] | Freq: Every day | SUBCUTANEOUS | Status: DC
  Administered 2017-08-26: 8 [IU] via SUBCUTANEOUS
  Filled 2017-08-26: qty 0.08

## 2017-08-26 MED ORDER — ACETAMINOPHEN 650 MG RE SUPP
650.0000 mg | Freq: Once | RECTAL | Status: AC
  Administered 2017-08-26: 650 mg via RECTAL
  Filled 2017-08-26: qty 1

## 2017-08-26 MED ORDER — OXYCODONE HCL 5 MG PO TABS: 5.0000 mg | ORAL_TABLET | ORAL | Status: DC | PRN

## 2017-08-26 MED ORDER — DEXTROSE 5 % IV SOLN
1.0000 g | INTRAVENOUS | Status: DC
  Administered 2017-08-27: 1 g via INTRAVENOUS
  Filled 2017-08-26: qty 10

## 2017-08-26 MED ORDER — INSULIN ASPART 100 UNIT/ML ~~LOC~~ SOLN
0.0000 [IU] | Freq: Three times a day (TID) | SUBCUTANEOUS | Status: DC
  Administered 2017-08-26: 1 [IU] via SUBCUTANEOUS
  Filled 2017-08-26: qty 1

## 2017-08-26 MED ORDER — LORAZEPAM 2 MG/ML IJ SOLN
1.0000 mg | INTRAMUSCULAR | Status: DC | PRN
  Administered 2017-08-26 – 2017-09-05 (×4): 1 mg via INTRAVENOUS
  Filled 2017-08-26 (×5): qty 1

## 2017-08-26 MED ORDER — HEPARIN SODIUM (PORCINE) 5000 UNIT/ML IJ SOLN
5000.0000 [IU] | Freq: Three times a day (TID) | INTRAMUSCULAR | Status: DC
  Administered 2017-08-26 – 2017-08-27 (×2): 5000 [IU] via SUBCUTANEOUS
  Filled 2017-08-26 (×2): qty 1

## 2017-08-26 MED ORDER — ASPIRIN EC 325 MG PO TBEC: 325.0000 mg | DELAYED_RELEASE_TABLET | Freq: Every day | ORAL | Status: DC

## 2017-08-26 NOTE — ED Notes (Signed)
Pt CBG 105

## 2017-08-26 NOTE — Consult Note (Signed)
Neurology Consultation Referring Physician: Dr Dina Rich   CC: Seizures  History is obtained from: Husband  HPI: [name] [name] is a 76 y.o. female with PMH of right parietal occipital hemorrhage, seizures,chronic kidney disease stage III, insulin-dependent diabetes mellitus, hypertension, recent hip surgery who presents to [facility] with prolonged seizure like activity. Husband states he woke up at 1.30 because he felt the patietn shaking in bed. Her eyes were closes and both hands were on her chest and shaking. Her eyes were closed, no frothing of mouth, tongue bite was noticed. He could not arouse her and after 15 min he placed her on a wheel chair and brought her to the hospital in his car. He states she would intermittently have shaking of both hands. He describes it as she was shivering.   On arrival, she received 1 mg of ativan and was no longer shaking. She slowly started to become responsive. On assessment, patient was lethargic, however would follow simple commands intermittently.   She is on Depakote 250 mg TID for seizures. She was previously on Keppra. Her huband also states she has been sleeping 16-20 hrs a day following her surgery and thinks its due to her pain medications.   ROS:  Unable to obtain due to altered mental status.   Past Medical History:  Diagnosis Date  . Anemia   . Arthritis    "a touch in my hands" (11/17/2016)  . Chronic back pain   . Chronic kidney disease (CKD), active medical management without dialysis, stage 3 (moderate) ([place])   . Depression   . GERD (gastroesophageal reflux disease)   . High cholesterol   . Hyperlipidemia   . Hypertension   . Seizures ([place])    last seizure 2014 (11/17/2016)  . Stroke [facility]) 2013   Secondary to acute right posterior temporo-occipital intra-axial hemorrhage  . Type II diabetes mellitus (HCC)      Family History  Problem Relation Age of Onset  . Cancer Mother   . Colon cancer Mother   . Liver disease Mother   . Breast  cancer Mother   . Cancer Father   . Diabetes Paternal Grandmother   . Breast cancer Maternal Aunt   . Breast cancer Maternal Aunt   . Esophageal cancer Neg Hx   . Rectal cancer Neg Hx   . Stomach cancer Neg Hx      Social History:  reports that she has never smoked. She has never used smokeless tobacco. She reports that she does not drink alcohol or use drugs.   Exam: Current vital signs: BP (!) 110/51   Pulse (!) 115   Temp 99.9 F (37.7 C) (Rectal)   Resp (!) 22   Ht 5\' 4"  (1.626 m)   Wt 59 kg (130 lb)   SpO2 99%   BMI 22.31 kg/m  Vital signs in last 24 hours: Temp:  [99.9 F (37.7 C)-104.2 F (40.1 C)] 99.9 F (37.7 C) (10/24 0620) Pulse Rate:  [107-134] 115 (10/24 0700) Resp:  [19-28] 22 (10/24 0700) BP: (95-139)/(38-86) 110/51 (10/24 0700) SpO2:  [99 %-100 %] 99 % (10/24 0700) Weight:  [59 kg (130 lb)] 59 kg (130 lb) (10/24 0341)   Physical Exam  Constitutional: Appears well-developed and well-nourished.  Psych: Affect appropriate to situation Eyes: No scleral injection HENT: No OP obstrucion Head: Normocephalic.  Cardiovascular: Normal rate and regular rhythm.  Respiratory: Effort normal and breath sounds normal to anterior ascultation GI: Soft.  No distension. There is no tenderness.  Skin:  WDI  Neuro: Mental Status: Patient is lethargic, awakens to sternal rub and follows few simple commands.  Cranial Nerves:  Visual Fields : unable to assess Pupils are equal, round, and reactive to light.   EOMI without ptosis or diploplia.  Facial sensation is symmetric to temperature Facial movement is symmetric.   tongue is midline without atrophy or fasciculations.  Motor: Tone is normal. Bulk is normal. 5/5 strength was present in all four extremities.  Sensory: Unable to assess Deep Tendon Reflexes: 2+ and symmetric in the biceps and patellae.  Plantars: Toes are downgoing bilaterally.  Cerebellar: Unable to assess     I have reviewed labs in  epic and the results pertinent to this consultation are:   I have reviewed the images obtained: CT Scan   ASSESSMENT AND PLAN  Crystalann Korf is a 77 y.o. female with PMH of right parietal occipital hemorrhage, seizures,chronic kidney disease stage III, insulin-dependent diabetes mellitus, hypertension, recent hip surgery who presents to [facility] with prolonged seizure like activity. Resolved now, follows simple commands - likely post ictal. CT head shows no acute abnormality.   Seizures/Status Epilepticus - resolved  Recommendations Load with Keppra 1.5g  Obtain VPA levels, continue home meds Check Ammonia Correct electrolyte abnormalities, evaluate for possible infection EEG if patient continues to be lethargic Minimize pain medications.

## 2017-08-26 NOTE — Progress Notes (Signed)
Pharmacy Antibiotic Note  [name] [name] is a 76 y.o. female admitted on 08/26/2017 with UTI.  Pharmacy has been consulted for ceftriaxone dosing.  Plan: -Ceftriaxone 2 gm IV once, then ceftriaxone 1 gm IV Q 24 hours -Monitor CBC, cultures and clinical progress -Pharmacy to sign off as no further dosage adjustments necessary   Height: 5\' 4"  (162.6 cm) Weight: 130 lb (59 kg) IBW/kg (Calculated) : 54.7  Temp (24hrs), Avg:102.1 F (38.9 C), Min:99.9 F (37.7 C), Max:104.2 F (40.1 C)   Recent Labs Lab 08/26/17 0345 08/26/17 0355 08/26/17 0556  WBC 20.5*  --   --   CREATININE 1.92* 1.80*  --   LATICACIDVEN  --  5.59* 4.40*    Estimated Creatinine Clearance: 23 mL/min (A) (by C-G formula based on SCr of 1.8 mg/dL (H)).    Allergies  Allergen Reactions  . Barbiturates     Becomes restless.  Also with all "strong" medications like sleeping pills  . Latex Itching    Antimicrobials this admission: Ceftriaxone 10/24 >>   Dose adjustments this admission: None   Microbiology results: 10/24 BCx:    Thank you for allowing pharmacy to be a part of this patient's care.  Albertina Parr, PharmD., BCPS Clinical Pharmacist Pager [phone]

## 2017-08-26 NOTE — ED Notes (Signed)
Awaiting call back from admitting MD to notify him of continued fever after Tylenol pr

## 2017-08-26 NOTE — Procedures (Signed)
ELECTROENCEPHALOGRAM REPORT  Date of Study: 08/26/2017  Patient's Name: [name] [name] MRN: [id number] Date of Birth: [birth year]  Referring Provider: Dr. Linna Darner  Clinical History: This is a 75 year old woman s/p seizure, unresponsive.  Medications: divalproex (DEPAKOTE ER) 250 MG 24 hr tablet  acetaminophen (TYLENOL) 325 MG tablet  aspirin EC 325 MG EC tablet atorvastatin (LIPITOR) 10 MG tablet  LEVEMIR FLEXTOUCH 100 UNIT/ML Pen Magnesium 500 MG TABS  Multiple Vitamin (MULTIVITAMIN) capsule NOVOLOG FLEXPEN 100 UNIT/ML SOPN FlexPen traMADol (ULTRAM) 50 MG tablet  vitamin C (ASCORBIC ACID) 500 MG tablet  metoprolol succinate (TOPROL-XL) 25 MG 24 hr tablet   Technical Summary: A multichannel digital EEG recording measured by the international 10-20 system with electrodes applied with paste and impedances below 5000 ohms performed as portable with EKG monitoring in an unresponsive patient.  Hyperventilation and photic stimulation were not performed.  The digital EEG was referentially recorded, reformatted, and digitally filtered in a variety of bipolar and referential montages for optimal display.   Description: The patient is unresponsive during the recording. There is no clear posterior dominant rhythm. There is a large amount of diffuse 4-5 Hz theta and 2-3 Hz delta slowing of the background, at times with triphasic-like appearance. Normal sleep architecture is not seen. Hyperventilation and photic stimulation were not performed.  There were no epileptiform discharges or electrographic seizures seen.    EKG lead showed sinus tachycardia.  Impression: This EEG is abnormal due to moderate diffuse background slowing.  Clinical Correlation of the above findings indicates diffuse cerebral dysfunction that is non-specific in etiology and can be seen with hypoxic/ischemic injury, toxic/metabolic encephalopathies, or medication effect.  The absence of epileptiform discharges does not  rule out a clinical diagnosis of epilepsy. There were no electrographic seizures in this study. Clinical correlation is advised.   Ellouise Newer, M.D.

## 2017-08-26 NOTE — ED Notes (Signed)
Pt pulled out of car by nursing staff, LKW 10p pt was found "unresponsive and shaking" in the bed this AM approx 0300. Pt husband picked her up and put in car and drove her here. Upon arrival pt appears to be seizing, unable to follow commands.

## 2017-08-26 NOTE — ED Provider Notes (Addendum)
[facility] EMERGENCY DEPARTMENT Provider Note   CSN: [id number] Arrival date & time: 08/26/17  0335     History   Chief Complaint Chief Complaint  Patient presents with  . Altered Mental Status    HPI [name] [name] is a 76 y.o. female.  HPI  This is a 76 yo female with a history of seizures, hypertension, hyperlipidemia, diabetes, hronic kidney disease who presents with altered mental status. Husband provides the history. He states that she has had recent decline in health including an admission for sepsis and a subsequent right hip fracture. They were that tonight when he woke up with her shaking. She appeared unresponsive. She shook for 30-45 minutes. He was able to get her in the car by carrying her. No recent fevers that he has noted or any other abnormalities.  Level V caveat for acuity of condition  Past Medical History:  Diagnosis Date  . Anemia   . Arthritis    "a touch in my hands" (11/17/2016)  . Chronic back pain   . Chronic kidney disease (CKD), active medical management without dialysis, stage 3 (moderate) ([facility])   . Depression   . GERD (gastroesophageal reflux disease)   . High cholesterol   . Hyperlipidemia   . Hypertension   . Seizures ([place])    last seizure 2014 (11/17/2016)  . Stroke [facility]) 2013   Secondary to acute right posterior temporo-occipital intra-axial hemorrhage  . Type II diabetes mellitus [facility])     Patient Active Problem List   Diagnosis Date Noted  . Status epilepticus ([place]) 08/26/2017  . Status post total hip replacement, right 08/10/2017  . Closed right hip fracture, initial encounter ([place]) 07/27/2017  . Hip fracture ([place]) 07/27/2017  . Pressure injury of skin 06/14/2017  . Encounter for palliative care   . Goals of care, counseling/discussion   . CAP (community acquired pneumonia) 06/12/2017  . Sepsis ([place]) 06/11/2017  . Weakness 06/11/2017  . Anemia 06/11/2017  . AKI (acute kidney injury) ([place]) 06/05/2017  .  Sepsis, unspecified organism ([place]) 06/05/2017  . Partial symptomatic epilepsy with complex partial seizures, not intractable, without status epilepticus ([place])   . Bacteremia due to Escherichia coli 11/18/2016  . Renal insufficiency   . Aortic atherosclerosis ([place]) 11/15/2016  . UTI (urinary tract infection) 11/15/2016  . Paranoia ([place]) 05/24/2015  . Tachycardia 07/02/2013  . Hypophosphatemia 07/02/2013  . Encounter for central line placement 07/02/2013  . Hypokalemia 07/01/2013  . Hypomagnesemia 07/01/2013  . Protein-calorie malnutrition, severe ([place]) 06/29/2013  . Leukocytosis, unspecified 06/29/2013  . 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 TIA (transient ischemic attack) 12/13/2012  . 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 Syncope 09/08/2012  . Acute respiratory failure with hypoxia ([place]) 08/20/2012  . Seizure disorder, grand mal ([place]) 08/20/2012  . Seizure ([place]) 08/20/2012  . CKD (chronic kidney disease) stage 3, GFR 30-59 ml/min ([facility]) 08/19/2012  . Leukocytosis 08/19/2012  . Hemorrhage in the brain-13 x 22 x 14 mm right posterior temporo-occipital intra-axial acute 08/19/2012  . Left hip pain 08/19/2012  . Left hemiparesis ([place]) 08/19/2012  . Hemianopia, homonymous, left 08/19/2012  . Hemisensory deficit, left 08/19/2012  . Nausea 08/19/2012  . Headache(784.0) 08/19/2012  . Acute encephalopathy 08/19/2012  . Hemorrhage of brain, nontraumatic ([place]) 08/18/2012  . Dysphagia 08/18/2012  . HTN (hypertension) 08/18/2012  . DM type 2 (diabetes mellitus, type 2) ([place]) 07/26/2012  . Hypoglycemia due to insulin 07/26/2012  . GERD (gastroesophageal reflux disease)  Past Surgical History:  Procedure Laterality Date  . CATARACT EXTRACTION W/ INTRAOCULAR LENS IMPLANT Bilateral   . COLONOSCOPY    . DILATION AND CURETTAGE OF UTERUS  X 3  . 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 TOTAL HIP ARTHROPLASTY Right 07/28/2017   Procedure: TOTAL HIP ARTHROPLASTY ANTERIOR APPROACH;  Surgeon: Mcarthur Rossetti, MD;  Location: [facility];  Service: Orthopedics;  Laterality: Right;    OB History    No data available       Home Medications    Prior to Admission medications   Medication Sig Start Date End Date Taking? Authorizing Provider  acetaminophen (TYLENOL) 325 MG tablet Take 2 tablets (650 mg total) by mouth every 6 (six) hours as needed for mild pain (or Fever >/= 101). 07/29/17  Yes Mcarthur Rossetti, MD  aspirin EC 325 MG EC tablet Take 1 tablet (325 mg total) by mouth daily with breakfast. 07/29/17  Yes Mcarthur Rossetti, MD  atorvastatin (LIPITOR) 10 MG tablet Take 10 mg by mouth daily.  02/11/13  Yes [provider]  divalproex (DEPAKOTE ER) 250 MG 24 hr tablet TAKE 3 TABLETS (750 MG TOTAL) BY MOUTH DAILY. 06/16/17  Yes Sethi, Lucy Antigua, MD  LEVEMIR FLEXTOUCH 100 UNIT/ML Pen INJECT 10 UNITS SUBCUTANEOUSLY DAILY AT NIGHT Patient taking differently: Inject 8 Units into the skin daily at 10 pm.  06/15/17  Yes Reyne Dumas, MD  Magnesium 500 MG TABS Take 1 tablet (500 mg total) by mouth 2 (two) times daily. 06/11/17  Yes Hongalgi, Lenis Dickinson, MD  Multiple Vitamin (MULTIVITAMIN) capsule Take 1 capsule by mouth daily.   Yes [provider]  NOVOLOG FLEXPEN 100 UNIT/ML SOPN FlexPen Inject 4-9 Units into the skin 3 (three) times daily with meals. Per sliding scale, 4 units if over 200 = 5 units in the morning and at lunch and 8 units if over 200 = 9 units. 07/29/13  Yes [provider]  omeprazole (PRILOSEC) 20 MG capsule Take 20 mg by mouth every morning.  03/17/15  Yes [provider]  ondansetron (ZOFRAN ODT) 4 MG disintegrating tablet Take 1 tablet (4 mg total) by mouth every 8 (eight) hours as needed for nausea or vomiting. 08/10/17  Yes Mcarthur Rossetti, MD    traMADol (ULTRAM) 50 MG tablet Take 1-2 tablets (50-100 mg total) by mouth every 6 (six) hours as needed. 08/10/17  Yes Mcarthur Rossetti, MD  vitamin C (ASCORBIC ACID) 500 MG tablet Take 500 mg by mouth daily.   Yes [provider]  metoprolol succinate (TOPROL-XL) 25 MG 24 hr tablet Take 1 tablet (25 mg total) by mouth daily. Patient not taking: Reported on 08/26/2017 06/12/17   Modena Jansky, MD    Family History Family History  Problem Relation Age of Onset  . Cancer Mother   . Colon cancer Mother   . Liver disease Mother   . Breast cancer Mother   . Cancer Father   . Diabetes Paternal Grandmother   . Breast cancer Maternal Aunt   . Breast cancer Maternal Aunt   . Esophageal cancer Neg Hx   . Rectal cancer Neg Hx   . Stomach cancer Neg Hx     Social History Social History  Substance Use Topics  . Smoking status: Never Smoker  . Smokeless tobacco: Never Used  .  Alcohol use No     Comment: patient drinks caffeinated drinks.      Allergies   Barbiturates and Latex   Review of Systems Review of Systems  Unable to perform ROS: Mental status change     Physical Exam Updated Vital Signs BP (!) 106/58   Pulse (!) 112   Temp 99.9 F (37.7 C) (Rectal)   Resp 20   Ht 5\' 4"  (1.626 m)   Wt 59 kg (130 lb)   SpO2 100%   BMI 22.31 kg/m   Physical Exam  Constitutional:  Ill-appearing, pale appearing, shaking, minimally responsive  HENT:  Head: Normocephalic and atraumatic.  Eyes: Pupils are equal, round, and reactive to light.  Pupils 3 millimeters and reactive bilaterally  Cardiovascular: Regular rhythm and normal heart sounds.   tachycardia  Pulmonary/Chest: Effort normal and breath sounds normal. No respiratory distress. She has no wheezes.  Abdominal: Soft. She exhibits no distension.  Neurological:  Minimally responsive but does turn her head to verbal, does not follow commands, rhythmic shaking of bilateral upper extremities, roving eye  movements  Skin: Skin is warm and dry.  Psychiatric: She has a normal mood and affect.  Nursing note and vitals reviewed.    ED Treatments / Results  Labs (all labs ordered are listed, but only abnormal results are displayed) Labs Reviewed  CBC WITH DIFFERENTIAL/PLATELET - Abnormal; Notable for the following:       Result Value   WBC 20.5 (*)    RBC 3.76 (*)    MCV 102.7 (*)    Neutro Abs 16.5 (*)    Monocytes Absolute 1.4 (*)    All other components within normal limits  COMPREHENSIVE METABOLIC PANEL - Abnormal; Notable for the following:    Sodium 133 (*)    Chloride 89 (*)    Glucose, Bld 273 (*)    BUN 25 (*)    Creatinine, Ser 1.92 (*)    Albumin 3.2 (*)    ALT 11 (*)    Alkaline Phosphatase 165 (*)    GFR calc non Af Amer 24 (*)    GFR calc Af Amer 28 (*)    Anion gap 18 (*)    All other components within normal limits  URINALYSIS, ROUTINE W REFLEX MICROSCOPIC - Abnormal; Notable for the following:    Glucose, UA 50 (*)    Ketones, ur 5 (*)    Protein, ur 30 (*)    Leukocytes, UA TRACE (*)    Bacteria, UA RARE (*)    All other components within normal limits  I-STAT CHEM 8, ED - Abnormal; Notable for the following:    Chloride 92 (*)    BUN 27 (*)    Creatinine, Ser 1.80 (*)    Glucose, Bld 268 (*)    Calcium, Ion 1.09 (*)    All other components within normal limits  I-STAT CG4 LACTIC ACID, ED - Abnormal; Notable for the following:    Lactic Acid, Venous 5.59 (*)    All other components within normal limits  CBG MONITORING, ED - Abnormal; Notable for the following:    Glucose-Capillary 236 (*)    All other components within normal limits  I-STAT CG4 LACTIC ACID, ED - Abnormal; Notable for the following:    Lactic Acid, Venous 4.40 (*)    All other components within normal limits  CULTURE, BLOOD (ROUTINE X 2)  CULTURE, BLOOD (ROUTINE X 2)  URINE CULTURE  VALPROIC ACID LEVEL  INFLUENZA PANEL BY  PCR (TYPE A & B)    EKG  EKG  Interpretation  Date/Time:  Wednesday August 26 2017 03:44:11 EDT Ventricular Rate:  133 PR Interval:    QRS Duration: 144 QT Interval:  347 QTC Calculation: 517 R Axis:   88 Text Interpretation:  Atrial flutter with predominant 2:1 AV block Ventricular premature complex Right bundle branch block Poor tracing, needs new images Confirmed by Thayer Jew [phone]) on 08/26/2017 4:45:34 AM       Radiology Ct Head Wo Contrast  Result Date: 08/26/2017 CLINICAL DATA:  Acute onset of altered level of consciousness. Initial encounter. EXAM: CT HEAD WITHOUT CONTRAST TECHNIQUE: Contiguous axial images were obtained from the base of the skull through the vertex without intravenous contrast. COMPARISON:  CT of the head performed 06/11/2017 FINDINGS: Brain: No evidence of acute infarction, hemorrhage, hydrocephalus, extra-axial collection or mass lesion/mass effect. Prominence of the ventricles and sulci reflects mild to moderate cortical volume loss. Mild periventricular and subcortical white matter change likely reflects small vessel ischemic microangiopathy. The brainstem and fourth ventricle are within normal limits. The basal ganglia are unremarkable in appearance. The cerebral hemispheres demonstrate grossly normal gray-white differentiation. No mass effect or midline shift is seen. Vascular: No hyperdense vessel or unexpected calcification. Skull: There is no evidence of fracture; visualized osseous structures are unremarkable in appearance. Sinuses/Orbits: The orbits are within normal limits. The paranasal sinuses and mastoid air cells are well-aerated. Other: A 2.4 cm soft tissue nodule is noted at the left posterior nose, superficial to the medial left maxilla. This has increased mildly in size over the last few years. Cerumen is noted at the external auditory canals bilaterally. IMPRESSION: 1. No acute intracranial pathology seen on CT. 2. Mild to moderate cortical volume loss and scattered small  vessel ischemic microangiopathy. 3. 2.4 cm soft tissue nodule at the left posterior nose, superficial to the medial left maxilla. This has increased mildly in size over the last few years. 4. Cerumen at the external auditory canals bilaterally. Electronically Signed   By: Garald Balding M.D.   On: 08/26/2017 04:52   Dg Chest Port 1 View  Result Date: 08/26/2017 CLINICAL DATA:  Acute onset of fever.  Initial encounter. EXAM: PORTABLE CHEST 1 VIEW COMPARISON:  Chest radiograph performed 07/27/2017 FINDINGS: The lungs are well-aerated and clear. There is no evidence of focal opacification, pleural effusion or pneumothorax. The cardiomediastinal silhouette is within normal limits. No acute osseous abnormalities are seen. IMPRESSION: No acute cardiopulmonary process seen. Electronically Signed   By: Garald Balding M.D.   On: 08/26/2017 04:22    Procedures Procedures (including critical care time)  CRITICAL CARE Performed by: Merryl Hacker   Total critical care time: 40 minutes  Critical care time was exclusive of separately billable procedures and treating other patients.  Critical care was necessary to treat or prevent imminent or life-threatening deterioration.  Critical care was time spent personally by me on the following activities: development of treatment plan with patient and/or surrogate as well as nursing, discussions with consultants, evaluation of patient's response to treatment, examination of patient, obtaining history from patient or surrogate, ordering and performing treatments and interventions, ordering and review of laboratory studies, ordering and review of radiographic studies, pulse oximetry and re-evaluation of patient's condition.   Medications Ordered in ED Medications  levETIRAcetam (KEPPRA) 1,000 mg in sodium chloride 0.9 % 100 mL IVPB (not administered)  LORazepam (ATIVAN) 2 MG/ML injection (1 mg  Given 08/26/17 0403)  sodium chloride 0.9 %  bolus 1,000 mL (0  mLs Intravenous Stopped 08/26/17 0452)    And  sodium chloride 0.9 % bolus 1,000 mL (0 mLs Intravenous Stopped 08/26/17 0509)  vancomycin (VANCOCIN) IVPB 1000 mg/200 mL premix (0 mg Intravenous Stopped 08/26/17 0509)  piperacillin-tazobactam (ZOSYN) IVPB 3.375 g (0 g Intravenous Stopped 08/26/17 0452)  acetaminophen (TYLENOL) suppository 650 mg (650 mg Rectal Given 08/26/17 0452)     Initial Impression / Assessment and Plan / ED Course  I have reviewed the triage vital signs and the nursing notes.  Pertinent labs & imaging results that were available during my care of the patient were reviewed by me and considered in my medical decision making (see chart for details).     Patient presents with altered mental status and shaking. Ongoing for last 30-45 minutes per her husband. She does have a history of seizures but has not had one in recent history.she takes Depakote.he appears to be actively [place] initial evaluation. Currently airway is intact. Patient was given 1 mg of Ativan. The rhythmic movement and seizure activity stopped. She is still very sleepy. Temperature noted to be 104.2. Unclear whether this is the cause of the seizure or as an effect of the seizure. Regardless, sepsis workup was initiated. Patient given fluids and broad-spectrum antibiotics. She has a lactate of 5.59. She also has a white count of 20.5. Both of these could be explained by prolonged seizure activity or infection.urinalysis and chest x-ray without obvious source. On multiple rechecks, patient remains somnolent. However, she now will speak and following simple commands.CT head is negative. Depakote level is pending. Will admit for further workup. Unclear at this time as she is truly septic or if her lab abnormalities and fever are a result of prolonged seizure activity.  6:43 AM  Patient discussed with Dr. Hal Hope.  He request neurology consult. Discussed with Dr.Aroor who recommends additionally loading the  patient with Keppra. This was ordered. Day team to admit.  Final Clinical Impressions(s) / ED Diagnoses   Final diagnoses:  Seizure ([place])  Fever, unspecified fever cause    New Prescriptions New Prescriptions   No medications on file     Merryl Hacker, MD 08/26/17 [id number]    Merryl Hacker, MD 08/26/17 [phone]

## 2017-08-26 NOTE — ED Notes (Signed)
Pt repositioned.  Pt continues to be unresponsive to verbal stimuli.  Some response to tactile stimuli.  Pupils are equal and responsive. Pt continues to feel warm, waiting for IV NSAID to come.  MD aware of her LOC.

## 2017-08-26 NOTE — Progress Notes (Signed)
Bedside EEG completed, results pending. 

## 2017-08-26 NOTE — ED Notes (Signed)
Pt CBG 149

## 2017-08-26 NOTE — H&P (Addendum)
History and Physical    [name] LPF:[id number] DOB: 17-May-1941 DOA: 08/26/2017  PCP: [name], MD Patient coming from: home  Chief Complaint: seizure  HPI: [name] [name] is a 76 y.o. female with medical history significant of anemia, CKD, depression, GERD, HLD, HTN, SZR, DM, Hip fracture.   Level 5 caveat applies. Patient unable to provide any history due to being somnolent due to being post ictal from extended seizure time. History provided by patient's husband who is her primary caregiver. Patient was in her normal state of health until onset of seizure on day of admission. Patient's husband reports waking up in bed in the middle of night to find his wife having a seizure. She was shaking all over. She was unresponsive. Reports shaking for approximately 30-45 minutes. Relieved w/ Ativan. Denies any recent change in her baseline health status including fevers, chest pain, shortness breath, palpitations, dysuria, frequency, flank pain, fevers, cough, neck stiffness, focal neurological deficits. Reports patient doing fairly well from a rehabilitation standpoint after recent hip fracture. Using tramadol for pain. She is not previously on tramadol.  ED Course: Patient brought in in status. Given Ativan and loading dose of Keppra  Review of Systems: As per HPI otherwise all other systems reviewed and are negative  Ambulatory Status: Difficulty with ambulation ever since patient sustained a right hip fracture 1 month prior to admission  Past Medical History:  Diagnosis Date  . Anemia   . Arthritis    "a touch in my hands" (11/17/2016)  . Chronic back pain   . Chronic kidney disease (CKD), active medical management without dialysis, stage 3 (moderate) ([place])   . Depression   . GERD (gastroesophageal reflux disease)   . High cholesterol   . Hyperlipidemia   . Hypertension   . Seizures ([place])    last seizure 2014 (11/17/2016)  . 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 Type II diabetes mellitus ([place])     Past Surgical History:  Procedure Laterality Date  . CATARACT EXTRACTION W/ INTRAOCULAR LENS IMPLANT Bilateral   . COLONOSCOPY    . DILATION AND CURETTAGE OF UTERUS  X 3  . 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 TOTAL HIP ARTHROPLASTY Right 07/28/2017   Procedure: TOTAL HIP ARTHROPLASTY ANTERIOR APPROACH;  Surgeon: Mcarthur Rossetti, MD;  Location: [facility];  Service: Orthopedics;  Laterality: Right;    Social History   Social History  . Marital status: Married    Spouse name: N/A  . Number of children: N/A  . Years of education: N/A   Occupational History  . Retired    Social History Main Topics  . Smoking status: Never Smoker  . Smokeless tobacco: Never Used  . Alcohol use No     Comment: patient drinks caffeinated drinks.   . Drug use: No  . Sexual activity: No   Other Topics Concern  . Not on file   Social History Narrative   Patient lives at home with her husband and daughter and has a high school education.     Allergies  Allergen Reactions  . Barbiturates     Becomes restless.  Also with all "strong" medications like sleeping pills  . Latex Itching    Family History  Problem Relation Age of Onset  . Cancer Mother   .  Colon cancer Mother   . Liver disease Mother   . Breast cancer Mother   . Cancer Father   . Diabetes Paternal Grandmother   . Breast cancer Maternal Aunt   . Breast cancer Maternal Aunt   . Esophageal cancer Neg Hx   . Rectal cancer Neg Hx   . Stomach cancer Neg Hx       Prior to Admission medications   Medication Sig Start Date End Date Taking? Authorizing Provider  acetaminophen (TYLENOL) 325 MG tablet Take 2 tablets (650 mg total) by mouth every 6 (six) hours as needed for mild pain (or Fever >/= 101). 07/29/17   Yes Mcarthur Rossetti, MD  aspirin EC 325 MG EC tablet Take 1 tablet (325 mg total) by mouth daily with breakfast. 07/29/17  Yes Mcarthur Rossetti, MD  atorvastatin (LIPITOR) 10 MG tablet Take 10 mg by mouth daily.  02/11/13  Yes [provider]  divalproex (DEPAKOTE ER) 250 MG 24 hr tablet TAKE 3 TABLETS (750 MG TOTAL) BY MOUTH DAILY. 06/16/17  Yes Sethi, Lucy Antigua, MD  LEVEMIR FLEXTOUCH 100 UNIT/ML Pen INJECT 10 UNITS SUBCUTANEOUSLY DAILY AT NIGHT Patient taking differently: Inject 8 Units into the skin daily at 10 pm.  06/15/17  Yes Reyne Dumas, MD  Magnesium 500 MG TABS Take 1 tablet (500 mg total) by mouth 2 (two) times daily. 06/11/17  Yes Hongalgi, Lenis Dickinson, MD  Multiple Vitamin (MULTIVITAMIN) capsule Take 1 capsule by mouth daily.   Yes [provider]  NOVOLOG FLEXPEN 100 UNIT/ML SOPN FlexPen Inject 4-9 Units into the skin 3 (three) times daily with meals. Per sliding scale, 4 units if over 200 = 5 units in the morning and at lunch and 8 units if over 200 = 9 units. 07/29/13  Yes [provider]  omeprazole (PRILOSEC) 20 MG capsule Take 20 mg by mouth every morning.  03/17/15  Yes [provider]  ondansetron (ZOFRAN ODT) 4 MG disintegrating tablet Take 1 tablet (4 mg total) by mouth every 8 (eight) hours as needed for nausea or vomiting. 08/10/17  Yes Mcarthur Rossetti, MD  traMADol (ULTRAM) 50 MG tablet Take 1-2 tablets (50-100 mg total) by mouth every 6 (six) hours as needed. 08/10/17  Yes Mcarthur Rossetti, MD  vitamin C (ASCORBIC ACID) 500 MG tablet Take 500 mg by mouth daily.   Yes [provider]  metoprolol succinate (TOPROL-XL) 25 MG 24 hr tablet Take 1 tablet (25 mg total) by mouth daily. Patient not taking: Reported on 08/26/2017 06/12/17   Modena Jansky, MD    Physical Exam: Vitals:   08/26/17 0620 08/26/17 0630 08/26/17 0700 08/26/17 0730  BP:  (!) 110/45 (!) 110/51 (!) 104/46  Pulse:  (!) 114 (!) 115 (!) 111    Resp:  (!) 23 (!) 22 (!) 21  Temp: 99.9 F (37.7 C)     TempSrc: Rectal     SpO2:  99% 99% 98%  Weight:      Height:         General: Sleeping in bed, no acute distress Eyes: PERRL,, normal lids, iris ENT: nml lips & tongue, mmm Neck:  no LAD, masses or thyromegaly Cardiovascular:  RRR, no m/r/g. No LE edema.  Respiratory:  CTA bilaterally, no w/r/r. Normal respiratory effort. Abdomen:  soft, ntnd, NABS Skin: no rash or induration seen on limited exam Musculoskeletal: Poor tone throughout secondary to somnolent state. No bony abnormalities appreciated. Psychiatric: Obtunded. Will grimace with painful stimulation  and moves slightly to voice but nonfocal. Neurologic: Sensation appears intact, no facial asymmetry  Labs on Admission: I have personally reviewed following labs and imaging studies  CBC:  Recent Labs Lab 08/26/17 0345 08/26/17 0355  WBC 20.5*  --   NEUTROABS 16.5*  --   HGB 12.0 13.9  HCT 38.6 41.0  MCV 102.7*  --   PLT 241  --    Basic Metabolic Panel:  Recent Labs Lab 08/26/17 0345 08/26/17 0355  NA 133* 135  K 3.8 3.9  CL 89* 92*  CO2 26  --   GLUCOSE 273* 268*  BUN 25* 27*  CREATININE 1.92* 1.80*  CALCIUM 9.1  --    GFR: Estimated Creatinine Clearance: 23 mL/min (A) (by C-G formula based on SCr of 1.8 mg/dL (H)). Liver Function Tests:  Recent Labs Lab 08/26/17 0345  AST 30  ALT 11*  ALKPHOS 165*  BILITOT 0.7  PROT 7.3  ALBUMIN 3.2*   No results for input(s): LIPASE, AMYLASE in the last 168 hours. No results for input(s): AMMONIA in the last 168 hours. Coagulation Profile: No results for input(s): INR, PROTIME in the last 168 hours. Cardiac Enzymes: No results for input(s): CKTOTAL, CKMB, CKMBINDEX, TROPONINI in the last 168 hours. BNP (last 3 results) No results for input(s): PROBNP in the last 8760 hours. HbA1C: No results for input(s): HGBA1C in the last 72 hours. CBG:  Recent Labs Lab 08/26/17 0343  GLUCAP 236*    Lipid Profile: No results for input(s): CHOL, HDL, LDLCALC, TRIG, CHOLHDL, LDLDIRECT in the last 72 hours. Thyroid Function Tests: No results for input(s): TSH, T4TOTAL, FREET4, T3FREE, THYROIDAB in the last 72 hours. Anemia Panel: No results for input(s): VITAMINB12, FOLATE, FERRITIN, TIBC, IRON, RETICCTPCT in the last 72 hours. Urine analysis:    Component Value Date/Time   COLORURINE YELLOW 08/26/2017 0345   APPEARANCEUR CLEAR 08/26/2017 0345   LABSPEC 1.015 08/26/2017 0345   PHURINE 6.0 08/26/2017 0345   GLUCOSEU 50 (A) 08/26/2017 0345   HGBUR NEGATIVE 08/26/2017 0345   BILIRUBINUR NEGATIVE 08/26/2017 0345   BILIRUBINUR neg 08/16/2012 1531   KETONESUR 5 (A) 08/26/2017 0345   PROTEINUR 30 (A) 08/26/2017 0345   UROBILINOGEN 0.2 06/25/2013 1002   NITRITE NEGATIVE 08/26/2017 0345   LEUKOCYTESUR TRACE (A) 08/26/2017 0345    Creatinine Clearance: Estimated Creatinine Clearance: 23 mL/min (A) (by C-G formula based on SCr of 1.8 mg/dL (H)).  Sepsis Labs: @LABRCNTIP (procalcitonin:4,lacticidven:4) )No results found for this or any previous visit (from the past 240 hour(s)).   Radiological Exams on Admission: Ct Head Wo Contrast  Result Date: 08/26/2017 CLINICAL DATA:  Acute onset of altered level of consciousness. Initial encounter. EXAM: CT HEAD WITHOUT CONTRAST TECHNIQUE: Contiguous axial images were obtained from the base of the skull through the vertex without intravenous contrast. COMPARISON:  CT of the head performed 06/11/2017 FINDINGS: Brain: No evidence of acute infarction, hemorrhage, hydrocephalus, extra-axial collection or mass lesion/mass effect. Prominence of the ventricles and sulci reflects mild to moderate cortical volume loss. Mild periventricular and subcortical white matter change likely reflects small vessel ischemic microangiopathy. The brainstem and fourth ventricle are within normal limits. The basal ganglia are unremarkable in appearance. The cerebral  hemispheres demonstrate grossly normal gray-white differentiation. No mass effect or midline shift is seen. Vascular: No hyperdense vessel or unexpected calcification. Skull: There is no evidence of fracture; visualized osseous structures are unremarkable in appearance. Sinuses/Orbits: The orbits are within normal limits. The paranasal sinuses and mastoid air cells are  well-aerated. Other: A 2.4 cm soft tissue nodule is noted at the left posterior nose, superficial to the medial left maxilla. This has increased mildly in size over the last few years. Cerumen is noted at the external auditory canals bilaterally. IMPRESSION: 1. No acute intracranial pathology seen on CT. 2. Mild to moderate cortical volume loss and scattered small vessel ischemic microangiopathy. 3. 2.4 cm soft tissue nodule at the left posterior nose, superficial to the medial left maxilla. This has increased mildly in size over the last few years. 4. Cerumen at the external auditory canals bilaterally. Electronically Signed   By: Garald Balding M.D.   On: 08/26/2017 04:52   Dg Chest Port 1 View  Result Date: 08/26/2017 CLINICAL DATA:  Acute onset of fever.  Initial encounter. EXAM: PORTABLE CHEST 1 VIEW COMPARISON:  Chest radiograph performed 07/27/2017 FINDINGS: The lungs are well-aerated and clear. There is no evidence of focal opacification, pleural effusion or pneumothorax. The cardiomediastinal silhouette is within normal limits. No acute osseous abnormalities are seen. IMPRESSION: No acute cardiopulmonary process seen. Electronically Signed   By: Garald Balding M.D.   On: 08/26/2017 04:22    EKG: pending    Assessment/Plan Active Problems:   GERD (gastroesophageal reflux disease)   DM type 2 (diabetes mellitus, type 2) (HCC)   HTN (hypertension)   CKD (chronic kidney disease) stage 3, GFR 30-59 ml/min (HCC)   Acute encephalopathy   Seizure disorder, grand mal ([place])   UTI (urinary tract infection)   Sepsis, unspecified  organism ([place])   Status epilepticus ([place])   S/P right hip fracture    Seizure: Known history of seizure. 30-45 min of szr prior to arrival. Depakote level normal. Likely secondary to recent initiation of tramadol for hip pain, versus infectious process lowering seizure threshold. Keppra 1.5 g IV given in ED - Continue Depakote - Seizure precautions - Repeat CK in pm - DC tramadol - EEG - Continue to follow neuro recs  Sepsis: presumed though sx may simply be due to prolonged seizure. Temp 104 on admission w/ lactic acid 5.59, WBC 20.5, tachycardia, tachypnea, acute mental status changes. Chest x-ray CT head without acute process noted. UA showing possible urinary tract infection the patient has not endorsed any symptoms prior to admission. Started on sepsis protocol in the ED with IV fluids and antibiotics. Blood cultures drawn. - Follow-up blood cultures, and urine cultures - Continue IVF - Stop vancomycin and Zosyn in favor of ceftriaxone alone for possible UTI - trend lactic acid  HTN: hypotensive on presentation.  - Hold home Metoprolol  - IVF  CKD: Cr 1.9. Baseline 1.25. Suspect from prolonged seizure. - IVF - BMP in am  Hip fracture: Underwent right total hip arthroplasty through direct anterior approach on 07/29/2017 by Dr. Ninfa Linden. Patient initially was admitted to rehabilitation facility but has been home for the last couple of weeks doing intermittent PT. This is not going well per patient's husband. - PT/OT - Ibuprofen, tylenol, oxyodone prn - Consider inpatient rehabilitation consultation.  Diabetes: - Continue Levemir - SSI  CAD: - Continue statin, ASA  DVT prophylaxis: Hep  Code Status: full  Family Communication: husband  Disposition Plan: pending improvement in mental status and PT eval  Consults called: Neuro  Admission status: Inpt    Waldemar Dickens MD [facility]  If 7PM-7AM, please contact night-coverage [email or web address] Password  [facility]  08/26/2017, 8:43 AM    Critical Care Time This patient is critically ill requiring frequent monitoring and support  due to their life/organ threatening condition. This care involves a high complexity of medical decision making. Greater than 70 minutes spent in direct patient care and coordination.

## 2017-08-26 NOTE — ED Notes (Signed)
Took over care from first shift.  Pt assessed with first shift RN.  Pt is laying calmly, just had her EEG.  Pt is tachy, hypertensive.  Not responsive to verbal stimuli at this time.  Pt does respond to touch but does not follow any commands.  First shift notes that this has been normal throughout the day.  Pt appears in no distress, has some tremor.  Gave ativan prn due to continued tremor and tachy and htn.  Husband is at the bedside and states at baseline pt has dementia and is confused but oriented to self and knows family members.  Husband states that pt is mostly bedbound and gets up with help from husband to get to bedside commode for toileting. Husband states that he feeds her also and bathes her but has some help from home health.  Husband states that he can be reached at [phone] if he is needed, he is going home now.

## 2017-08-26 NOTE — ED Notes (Signed)
Call to 6E, await call back from RN

## 2017-08-26 NOTE — ED Notes (Signed)
Call was made to 3W for report.  RN states that they are not stepdown.  Call to patient placement

## 2017-08-27 ENCOUNTER — Ambulatory Visit: Payer: Federal, State, Local not specified - PPO | Admitting: Neurology

## 2017-08-27 DIAGNOSIS — I272 Pulmonary hypertension, unspecified: Secondary | ICD-10-CM

## 2017-08-27 DIAGNOSIS — D72829 Elevated white blood cell count, unspecified: Secondary | ICD-10-CM

## 2017-08-27 DIAGNOSIS — E1121 Type 2 diabetes mellitus with diabetic nephropathy: Secondary | ICD-10-CM

## 2017-08-27 DIAGNOSIS — S72001A Fracture of unspecified part of neck of right femur, initial encounter for closed fracture: Secondary | ICD-10-CM

## 2017-08-27 DIAGNOSIS — I5032 Chronic diastolic (congestive) heart failure: Secondary | ICD-10-CM

## 2017-08-27 DIAGNOSIS — G40909 Epilepsy, unspecified, not intractable, without status epilepticus: Secondary | ICD-10-CM

## 2017-08-27 LAB — RAPID URINE DRUG SCREEN, HOSP PERFORMED
AMPHETAMINES: NOT DETECTED
BARBITURATES: NOT DETECTED
BENZODIAZEPINES: NOT DETECTED
Cocaine: NOT DETECTED
Opiates: NOT DETECTED
Tetrahydrocannabinol: NOT DETECTED

## 2017-08-27 LAB — BASIC METABOLIC PANEL
Anion gap: 12 (ref 5–15)
BUN: 14 mg/dL (ref 6–20)
CALCIUM: 6.6 mg/dL — AB (ref 8.9–10.3)
CO2: 23 mmol/L (ref 22–32)
Chloride: 100 mmol/L — ABNORMAL LOW (ref 101–111)
Creatinine, Ser: 1.16 mg/dL — ABNORMAL HIGH (ref 0.44–1.00)
GFR calc Af Amer: 52 mL/min — ABNORMAL LOW (ref >60)
GFR, EST NON AFRICAN AMERICAN: 45 mL/min — AB (ref >60)
GLUCOSE: 733 mg/dL — AB (ref 65–99)
Potassium: 2.6 mmol/L — CL (ref 3.5–5.1)
SODIUM: 135 mmol/L (ref 135–145)

## 2017-08-27 LAB — GLUCOSE, CAPILLARY
GLUCOSE-CAPILLARY: 138 mg/dL — AB (ref 65–99)
GLUCOSE-CAPILLARY: 160 mg/dL — AB (ref 65–99)
GLUCOSE-CAPILLARY: 172 mg/dL — AB (ref 65–99)
GLUCOSE-CAPILLARY: 44 mg/dL — AB (ref 65–99)
Glucose-Capillary: 85 mg/dL (ref 65–99)
Glucose-Capillary: 31 mg/dL — CL (ref 65–99)
Glucose-Capillary: 171 mg/dL — ABNORMAL HIGH (ref 65–99)

## 2017-08-27 LAB — CSF CELL COUNT WITH DIFFERENTIAL
RBC Count, CSF: 36 /mm3 — ABNORMAL HIGH
RBC Count, CSF: 67 /mm3 — ABNORMAL HIGH
Tube #: 1
Tube #: 4
WBC CSF: 2 /mm3 (ref 0–5)
WBC CSF: 2 /mm3 (ref 0–5)

## 2017-08-27 LAB — CBC
HCT: 28.5 % — ABNORMAL LOW (ref 36.0–46.0)
HEMATOCRIT: 36.7 % (ref 36.0–46.0)
Hemoglobin: 11.9 g/dL — ABNORMAL LOW (ref 12.0–15.0)
Hemoglobin: 8.9 g/dL — ABNORMAL LOW (ref 12.0–15.0)
MCH: 33.3 pg (ref 26.0–34.0)
MCH: 32.1 pg (ref 26.0–34.0)
MCHC: 32.4 g/dL (ref 30.0–36.0)
MCHC: 31.2 g/dL (ref 30.0–36.0)
MCV: 102.8 fL — ABNORMAL HIGH (ref 78.0–100.0)
MCV: 102.9 fL — ABNORMAL HIGH (ref 78.0–100.0)
PLATELETS: UNDETERMINED 10*3/uL (ref 150–400)
PLATELETS: 122 10*3/uL — AB (ref 150–400)
RBC: 3.57 MIL/uL — ABNORMAL LOW (ref 3.87–5.11)
RBC: 2.77 MIL/uL — ABNORMAL LOW (ref 3.87–5.11)
RDW: 15.5 % (ref 11.5–15.5)
RDW: 15.5 % (ref 11.5–15.5)
WBC: 31.6 10*3/uL — AB (ref 4.0–10.5)
WBC: 24.6 10*3/uL — ABNORMAL HIGH (ref 4.0–10.5)

## 2017-08-27 LAB — COMPREHENSIVE METABOLIC PANEL
ALT: 12 U/L — ABNORMAL LOW (ref 14–54)
AST: 41 U/L (ref 15–41)
Albumin: 2.6 g/dL — ABNORMAL LOW (ref 3.5–5.0)
Alkaline Phosphatase: 135 U/L — ABNORMAL HIGH (ref 38–126)
Anion gap: 11 (ref 5–15)
BILIRUBIN TOTAL: 0.4 mg/dL (ref 0.3–1.2)
BUN: 18 mg/dL (ref 6–20)
CO2: 25 mmol/L (ref 22–32)
CREATININE: 1.28 mg/dL — AB (ref 0.44–1.00)
Calcium: 8.3 mg/dL — ABNORMAL LOW (ref 8.9–10.3)
Chloride: 99 mmol/L — ABNORMAL LOW (ref 101–111)
GFR calc Af Amer: 46 mL/min — ABNORMAL LOW (ref >60)
GFR, EST NON AFRICAN AMERICAN: 40 mL/min — AB (ref >60)
Glucose, Bld: 96 mg/dL (ref 65–99)
POTASSIUM: 3.9 mmol/L (ref 3.5–5.1)
Sodium: 135 mmol/L (ref 135–145)
TOTAL PROTEIN: 6.6 g/dL (ref 6.5–8.1)

## 2017-08-27 LAB — APTT
aPTT: 47 seconds — ABNORMAL HIGH (ref 24–36)
aPTT: 42 seconds — ABNORMAL HIGH (ref 24–36)

## 2017-08-27 LAB — PROTEIN AND GLUCOSE, CSF
Glucose, CSF: 83 mg/dL — ABNORMAL HIGH (ref 40–70)
TOTAL PROTEIN, CSF: 27 mg/dL (ref 15–45)

## 2017-08-27 LAB — CRYPTOCOCCAL ANTIGEN, CSF: CRYPTO AG: NEGATIVE

## 2017-08-27 LAB — MRSA PCR SCREENING: MRSA by PCR: POSITIVE — AB

## 2017-08-27 LAB — TROPONIN I: TROPONIN I: 0.03 ng/mL — AB (ref <0.03)

## 2017-08-27 LAB — MAGNESIUM: MAGNESIUM: 1.3 mg/dL — AB (ref 1.7–2.4)

## 2017-08-27 MED ORDER — POTASSIUM CHLORIDE 10 MEQ/100ML IV SOLN
10.0000 meq | INTRAVENOUS | Status: AC
  Administered 2017-08-27 – 2017-08-28 (×2): 10 meq via INTRAVENOUS
  Filled 2017-08-27 (×2): qty 100

## 2017-08-27 MED ORDER — VANCOMYCIN HCL IN DEXTROSE 1-5 GM/200ML-% IV SOLN
1000.0000 mg | INTRAVENOUS | Status: AC
  Administered 2017-08-28 – 2017-08-31 (×4): 1000 mg via INTRAVENOUS
  Filled 2017-08-27 (×4): qty 200

## 2017-08-27 MED ORDER — DEXTROSE 50 % IV SOLN
INTRAVENOUS | Status: AC
  Administered 2017-08-27: 50 mL
  Filled 2017-08-27: qty 50

## 2017-08-27 MED ORDER — PANTOPRAZOLE SODIUM 40 MG IV SOLR
40.0000 mg | Freq: Every day | INTRAVENOUS | Status: DC
  Administered 2017-08-27 – 2017-08-28 (×2): 40 mg via INTRAVENOUS
  Filled 2017-08-27 (×2): qty 40

## 2017-08-27 MED ORDER — ASPIRIN 300 MG RE SUPP
300.0000 mg | Freq: Every day | RECTAL | Status: DC
  Administered 2017-08-27 – 2017-08-28 (×2): 300 mg via RECTAL
  Filled 2017-08-27 (×2): qty 1

## 2017-08-27 MED ORDER — MUPIROCIN 2 % EX OINT
1.0000 "application " | TOPICAL_OINTMENT | Freq: Two times a day (BID) | CUTANEOUS | Status: DC
  Administered 2017-08-27 – 2017-08-31 (×8): 1 via NASAL
  Filled 2017-08-27 (×2): qty 22

## 2017-08-27 MED ORDER — PIPERACILLIN-TAZOBACTAM 3.375 G IVPB
3.3750 g | Freq: Three times a day (TID) | INTRAVENOUS | Status: AC
  Administered 2017-08-28 – 2017-08-31 (×12): 3.375 g via INTRAVENOUS
  Filled 2017-08-27 (×13): qty 50

## 2017-08-27 MED ORDER — VALPROATE SODIUM 500 MG/5ML IV SOLN
750.0000 mg | Freq: Every day | INTRAVENOUS | Status: DC
  Administered 2017-08-27: 750 mg via INTRAVENOUS
  Filled 2017-08-27 (×3): qty 7.5

## 2017-08-27 MED ORDER — CHLORHEXIDINE GLUCONATE CLOTH 2 % EX PADS
6.0000 | MEDICATED_PAD | Freq: Every day | CUTANEOUS | Status: DC
  Administered 2017-08-28 – 2017-08-31 (×4): 6 via TOPICAL

## 2017-08-27 MED ORDER — SODIUM CHLORIDE 0.9 % IV SOLN
1250.0000 mg | Freq: Once | INTRAVENOUS | Status: AC
  Administered 2017-08-27: 1250 mg via INTRAVENOUS
  Filled 2017-08-27: qty 1250

## 2017-08-27 MED ORDER — HEPARIN SODIUM (PORCINE) 5000 UNIT/ML IJ SOLN
5000.0000 [IU] | Freq: Three times a day (TID) | INTRAMUSCULAR | Status: DC
  Administered 2017-08-27 – 2017-09-11 (×44): 5000 [IU] via SUBCUTANEOUS
  Filled 2017-08-27 (×42): qty 1

## 2017-08-27 MED ORDER — PIPERACILLIN-TAZOBACTAM 3.375 G IVPB 30 MIN
3.3750 g | Freq: Once | INTRAVENOUS | Status: AC
  Administered 2017-08-27: 3.375 g via INTRAVENOUS
  Filled 2017-08-27: qty 50

## 2017-08-27 MED ORDER — DEXTROSE-NACL 5-0.9 % IV SOLN
INTRAVENOUS | Status: DC
  Administered 2017-08-27 – 2017-08-31 (×4): via INTRAVENOUS

## 2017-08-27 NOTE — Progress Notes (Signed)
Subjective: She remains sleepy and appears to be shivering intermittently but awakes to verbal address and answers questions with one sentence responses. She had continued fever until about 10am. Her husband was at bedside this morning and we discussed her improvement this afternoon when arranging for lumbar puncture.  Exam: Vitals:   08/27/17 1112 08/27/17 1303  BP:  (!) 116/47  Pulse:    Resp:    Temp: 100.3 F (37.9 C) 98.8 F (37.1 C)  SpO2:  100%   Gen: In bed, NAD Resp: non-labored breathing, no acute distress Abd: soft, nt Neuro: MS: She awakens easily to voice and remains awake CN: II - Visual fields intact. III, IV, VI - Extraocular movements intact both sides. VII - Facial movement intact bilaterally. VIII - Hearing & vestibular intact bilaterally. XI - Chin turning & shoulder shrug intact bilaterally. XII - Tongue protrusion intact. Motor: 5/5 strength in her hands and moves toes to command, lifts arms antigravity on command, withdraws briskly from pain throughout Sensory: Withdraws from pain all extremities DTR:2+ bilateraly lower extremity  Pertinent Labs: CBG @0735 : 31 APTT: 42 WBC: 31.6 Urine toxicology: Negative  Impression: 76 y.o. female with PMH of right parietal occipital hemorrhage, seizures,chronic kidney disease stage III, insulin-dependent diabetes mellitus, hypertension, recent hip surgery who presents to [facility] with prolonged seizure like activity.  She is following simple commands and wakens to voice now greatly improved compared to yesterday. She is not in status epilepticus and may be recovering postictal or improving encephalopathy. She is being treated with ceftriaxone for UTI and is treated with VPA for seizure prophylaxis. Hypoglycemia overnight does not explain seizures with fever. Most likely clinical picture at this time is septic encephalopathy leading to breakthrough seizure.  Recommendations: 1) LP today - follow up CSF for infection in  setting of febrile seizures 2) Continue antibiotics, Urine Cx pending 3) Continue VPA for seizure prophylaxis   For full impression and recommendations see attending physician attestation.  Collier Salina, MD PGY-III Internal Medicine Resident Pager# [phone] 08/27/2017, 4:20 PM

## 2017-08-27 NOTE — Evaluation (Signed)
Occupational Therapy Evaluation Patient Details Name: [name] [name] MRN: [id number] DOB: Nov 05, 1940 Today's Date: 08/27/2017    History of Present Illness Patient is a 76 y/o female who presents with witnessed seizure and now post ictal. PMH includes anemia, CKD, depression, GERD, HLD, HTN, SZR, DM, Hip fracture.   Clinical Impression   Pt is typically dependent in all ADL. Husband reports she has a tremor that makes it difficult for her to even self feed. She was participating in [place] and making progress in mobility. Pt presents with lethargy, maintaining eyes closed despite best effort to arouse pt with multiple stimuli. She requires total assist. Recommending SNF, will follow acutely.    Follow Up Recommendations  SNF    Equipment Recommendations       Recommendations for Other Services       Precautions / Restrictions Precautions Precautions: Fall Restrictions Weight Bearing Restrictions: No      Mobility Bed Mobility Overal bed mobility: Needs Assistance Bed Mobility: Supine to Sit;Sit to Supine     Supine to sit: Total assist;+2 for physical assistance;HOB elevated Sit to supine: Total assist;+2 for physical assistance;HOB elevated   General bed mobility comments: Helicopter technique using pad to bring pt to EOB and to return to supine.   Transfers                 General transfer comment: Deferred.    Balance Overall balance assessment: Needs assistance Sitting-balance support: Feet supported;No upper extremity supported Sitting balance-Leahy Scale: Zero Sitting balance - Comments: Requires total A most of time to maintain balance. Demonstrates appropriate righting reactions towards right side and posteriorly on a few occasions. No righting reactions towards left.                                    ADL either performed or assessed with clinical judgement   ADL                                         General ADL  Comments: requires total assist, husband reports her tremor keeps her from being able to self feed at baseline     Vision   Additional Comments: unable to assess due to lethargy     Perception     Praxis      Pertinent Vitals/Pain Pain Assessment: Faces Faces Pain Scale: Hurts little more Pain Location: with noxious stimulus Pain Descriptors / Indicators: Grimacing;Moaning Pain Intervention(s): Monitored during session     Hand Dominance Right   Extremity/Trunk Assessment Upper Extremity Assessment Upper Extremity Assessment: Generalized weakness (no movement noted even with noxious stimuli)   Lower Extremity Assessment Lower Extremity Assessment: Defer to PT evaluation   Cervical / Trunk Assessment Cervical / Trunk Assessment: Kyphotic   Communication Communication Communication: HOH   Cognition Arousal/Alertness: Lethargic Behavior During Therapy: Flat affect Overall Cognitive Status: Difficult to assess                                 General Comments: Pt grimaces and moans to noxious stimulus of BUEs but does not withdraw. Withdraws to pain in BLEs with noxious stimulus. Attempted all kinds of stimulus- wet wash cloth, rhythmic trunk/UE movements, singing, verbal, sternal rubm, noxious stimulus, husband's voice- but not able  to arouse. With eyes held open, pt able to track x1 but not consistently.   General Comments  HR up to 120 bpm during session.    Exercises     Shoulder Instructions      Home Living Family/patient expects to be discharged to:: Private residence Living Arrangements: Spouse/significant other Available Help at Discharge: Family;Available 24 hours/day Type of Home: House Home Access: Stairs to enter [employer] of Steps: 1-- tall 8' step Entrance Stairs-Rails: None Home Layout: One level     Bathroom Shower/Tub: Teacher, early years/pre: Standard     Home Equipment: Environmental consultant - 2 wheels;Bedside  commode;Wheelchair - manual          Prior Functioning/Environment Level of Independence: Needs assistance  Gait / Transfers Assistance Needed: Pt getting HHPT, spouse reports pt mainly bedbound and he helps her transfer to [facility]. She is not really ambulatory. ADL's / Homemaking Assistance Needed: Spouse does all ADLs and IADLs.             OT Problem List: Decreased strength      OT Treatment/Interventions: Balance training;Patient/family education;Therapeutic activities;Cognitive remediation/compensation    OT Goals(Current goals can be found in the care plan section) Acute Rehab OT Goals Patient Stated Goal: to get her to wake up and come back home OT Goal Formulation: With family Time For Goal Achievement: 09/10/17 Potential to Achieve Goals: Fair ADL Goals Pt Will Perform Grooming: with mod assist;bed level (wash face) Pt Will Transfer to Toilet: with mod assist;stand pivot transfer;bedside commode Additional ADL Goal #1: Pt will sit EOB with min assist x 10 minutes in preparation for ADL. Additional ADL Goal #2: Pt will follow one step commands 50% of time.  OT Frequency: Min 2X/week   Barriers to D/C:            Co-evaluation PT/OT/SLP Co-Evaluation/Treatment: Yes Reason for Co-Treatment: For patient/therapist safety;Necessary to address cognition/behavior during functional activity PT goals addressed during session: Mobility/safety with mobility OT goals addressed during session: Strengthening/ROM      AM-PAC PT "6 Clicks" Daily Activity     Outcome Measure Help from another person eating meals?: Total Help from another person taking care of personal grooming?: Total Help from another person toileting, which includes using toliet, bedpan, or urinal?: Total Help from another person bathing (including washing, rinsing, drying)?: Total Help from another person to put on and taking off regular upper body clothing?: Total Help from another person to put on and  taking off regular lower body clothing?: Total 6 Click Score: 6   End of Session Equipment Utilized During Treatment: Oxygen  Activity Tolerance: Patient limited by lethargy Patient left: in bed;with call bell/phone within reach;with family/visitor present;with bed alarm set  OT Visit Diagnosis: Cognitive communication deficit (R41.841);Muscle weakness (generalized) (M62.81)                Time: 1517-6160 OT Time Calculation (min): 18 min Charges:  OT General Charges $OT Visit: 1 Visit OT Evaluation $OT Eval Moderate Complexity: 1 Mod G-Codes:     September 11, 2017 Nestor Lewandowsky, OTR/L Pager: [phone] Werner Lean, Haze Boyden 09-11-17, 11:13 AM

## 2017-08-27 NOTE — Progress Notes (Signed)
Blood sugar 44 rechecked 41-Dextrose 50% one amp given intravenously- lab came back 96 - last blood sugar at 0447 85. levemir 8 unit was given at 2200.

## 2017-08-27 NOTE — Progress Notes (Signed)
Pharmacy Antibiotic Note  [name] [name] is a 76 y.o. female on rocephin for concern of UTI to change to zosyn and vancomycin for r/o sepsis. -WBC= 31.6, tmax= 102.4, SCr= 1.28, CrCl ~ 30  Plan: -Zosyn 3.375gm IV q8h -Vancomycin 1250 mg IV x1 followed by 1000mg  IV q24h -Will follow renal function, cultures and clinical progress   Height: 5\' 4"  (162.6 cm) Weight: 139 lb 14.4 oz (63.5 kg) IBW/kg (Calculated) : 54.7  Temp (24hrs), Avg:100.2 F (37.9 C), Min:98.1 F (36.7 C), Max:102.4 F (39.1 C)   Recent Labs Lab 08/26/17 0345 08/26/17 0355 08/26/17 0556 08/26/17 0857 08/26/17 1200 08/27/17 0400  WBC 20.5*  --   --   --   --  31.6*  CREATININE 1.92* 1.80*  --   --   --  1.28*  LATICACIDVEN  --  5.59* 4.40* 2.9* 1.9  --     Estimated Creatinine Clearance: 32.3 mL/min (A) (by C-G formula based on SCr of 1.28 mg/dL (H)).    Allergies  Allergen Reactions  . Barbiturates     Becomes restless.  Also with all "strong" medications like sleeping pills  . Latex Itching    Antimicrobials this admission: 10/25 vanc 10/25 zosyn 10/24 rocephin>> 10/25   Dose adjustments this admission:  Microbiology results: 10/25 CSF 10/25 urine 10/24 blood x2 10/25 MRSA PCR +   Thank you for allowing pharmacy to be a part of this patient's care.  Hildred Laser, Pharm D 08/27/2017 7:22 PM

## 2017-08-27 NOTE — Evaluation (Signed)
Physical Therapy Evaluation Patient Details Name: [name] [name] MRN: [id number] DOB: 10/28/1941 Today's Date: 08/27/2017   History of Present Illness  Patient is a 76 y/o female who presents with witnessed seizure and now post ictal. PMH includes anemia, CKD, depression, GERD, HLD, HTN, SZR, DM, Hip fracture.  Clinical Impression  Evaluation performed with OT due to pt's decreased level of arousal. Session focused on trying to elicit any sort of arousal/alertness. Patient not able to be aroused with a variety of stimuli including wet wash cloth, spouse's voice, rhythmic trunk movement, singing or verbal or sternal rub but does withdraw to noxious stimulus in BLEs. Also moans and/or grimaces to noxious stimuli of BUEs but does not withdraw. HR up to 121 bpm during session. Spouse present and provided PLOF/history. Pt currently getting HHPT and is mainly bedbound at home since recent hip surgery. Spouse assists with all ADLs, IADLs and transfers. Will be able to make a more accurate assessment once pt's arousal improves. Spouse would like to take pt home however not able to care for her in this state. May need SNF. Will follow.    Follow Up Recommendations SNF;Supervision for mobility/OOB;Supervision/Assistance - 24 hour    Equipment Recommendations  Other (comment) (hospital bed if pt goes home)    Recommendations for Other Services OT consult     Precautions / Restrictions Precautions Precautions: Fall Restrictions Weight Bearing Restrictions: No      Mobility  Bed Mobility Overal bed mobility: Needs Assistance Bed Mobility: Supine to Sit;Sit to Supine     Supine to sit: Total assist;+2 for physical assistance;HOB elevated Sit to supine: Total assist;+2 for physical assistance;HOB elevated   General bed mobility comments: Helicopter technique using pad to bring pt to EOB and to return to supine.   Transfers                 General transfer comment:  Deferred.  Ambulation/Gait                Stairs            Wheelchair Mobility    Modified Rankin (Stroke Patients Only)       Balance Overall balance assessment: Needs assistance Sitting-balance support: Feet supported;No upper extremity supported Sitting balance-Leahy Scale: Zero Sitting balance - Comments: Requires total A most of time to maintain balance. Demonstrates appropriate righting reactions towards right side and posteriorly on a few occasions. No righting reactions towards left.                                      Pertinent Vitals/Pain Pain Assessment: Faces Faces Pain Scale: Hurts little more Pain Location: with noxious stimulus Pain Descriptors / Indicators: Grimacing;Moaning Pain Intervention(s): Monitored during session    Home Living Family/patient expects to be discharged to:: Private residence Living Arrangements: Spouse/significant other Available Help at Discharge: Family;Available 24 hours/day Type of Home: House Home Access: Stairs to enter Entrance Stairs-Rails: None Entrance Stairs-Number of Steps: 1-- tall 8' step Home Layout: One level Home Equipment: Walker - 2 wheels;Bedside commode;Wheelchair - manual      Prior Function Level of Independence: Needs assistance   Gait / Transfers Assistance Needed: Pt getting HHPT, spouse reports pt mainly bedbound and he helps her transfer to [facility]. She is not really ambulatory.  ADL's / Homemaking Assistance Needed: Spouse does all ADLs and IADLs.         Hand  Dominance   Dominant Hand: Right    Extremity/Trunk Assessment   Upper Extremity Assessment Upper Extremity Assessment: Defer to OT evaluation    Lower Extremity Assessment Lower Extremity Assessment: Difficult to assess due to impaired cognition (not formally assessed. PROM BLEs/UEs WFL.)    Cervical / Trunk Assessment Cervical / Trunk Assessment: Kyphotic  Communication   Communication:  (not able to  accurately assess)  Cognition Arousal/Alertness:  (obtunded) Behavior During Therapy: Flat affect Overall Cognitive Status: Difficult to assess                                 General Comments: Pt grimaces and moans to noxious stimulus of BUEs but does not withdraw. Withdraws to pain in BLEs with noxious stimulus. Attempted all kinds of stimulus- wet wash cloth, rhythmic trunk/UE movements, singing, verbal, sternal rubm, noxious stimulus, husband's voice- but not able to arouse. With eyes held open, pt able to track x1 but not consistently.      General Comments General comments (skin integrity, edema, etc.): HR up to 120 bpm during session.    Exercises     Assessment/Plan    PT Assessment Patient needs continued PT services  PT Problem List Decreased strength;Decreased mobility;Decreased safety awareness;Decreased range of motion;Decreased coordination;Decreased balance;Decreased activity tolerance;Decreased cognition;Cardiopulmonary status limiting activity       PT Treatment Interventions Therapeutic activities;Therapeutic exercise;Patient/family education;Balance training;Functional mobility training;Cognitive remediation;Gait training;Wheelchair mobility training;Neuromuscular re-education    PT Goals (Current goals can be found in the Care Plan section)  Acute Rehab PT Goals Patient Stated Goal: to get her to wake up and come back home PT Goal Formulation: With family Time For Goal Achievement: 09/10/17 Potential to Achieve Goals: Fair    Frequency Min 2X/week   Barriers to discharge        Co-evaluation PT/OT/SLP Co-Evaluation/Treatment: Yes Reason for Co-Treatment: For patient/therapist safety;Necessary to address cognition/behavior during functional activity;To address functional/ADL transfers PT goals addressed during session: Mobility/safety with mobility         AM-PAC PT "6 Clicks" Daily Activity  Outcome Measure Difficulty turning over in  bed (including adjusting bedclothes, sheets and blankets)?: Unable Difficulty moving from lying on back to sitting on the side of the bed? : Unable Difficulty sitting down on and standing up from a chair with arms (e.g., wheelchair, bedside commode, etc,.)?: Unable Help needed moving to and from a bed to chair (including a wheelchair)?: Total Help needed walking in hospital room?: Total Help needed climbing 3-5 steps with a railing? : Total 6 Click Score: 6    End of Session Equipment Utilized During Treatment: Oxygen Activity Tolerance: Patient limited by lethargy Patient left: in bed;with call bell/phone within reach;with family/visitor present;with bed alarm set Nurse Communication: Mobility status;Need for lift equipment PT Visit Diagnosis: Muscle weakness (generalized) (M62.81);Other symptoms and signs involving the nervous system (I62.703)    Time: 5009-3818 PT Time Calculation (min) (ACUTE ONLY): 19 min   Charges:   PT Evaluation $PT Eval Moderate Complexity: 1 Mod     PT G CodesWray Kearns, PT, DPT [phone]    Marguarite Arbour A Esteban Kobashigawa 08/27/2017, 10:56 AM

## 2017-08-27 NOTE — Progress Notes (Signed)
Pt AM CBG was 31. Gave pt an amp of 50 of dextrose. Paged Dr. Sherral Hammers to see if fluids could be changed. Received orders to change fluids and diabetic meds were d/c'd. Recheck of CBG was 138. Will continue to monitor.  Shanon Rosser, RN

## 2017-08-27 NOTE — Procedures (Signed)
Indication: Fever of unknown source and Seizure  Risks of the procedure were dicussed with the patients husband including post-LP headache, bleeding, infection, weakness/numbness of legs(radiculopathy), death.  The patient's husband agreed and written consent was obtained.   The patient was prepped and draped, and using sterile technique a 20 gauge quinke spinal needle was inserted in the L 3/4space. The opening pressure was 9 mmHg H20. Approximately 11 cc of CSF were obtained and sent for analysis. Only one attempt was made to obtain CSF. No complications were encountered.   Etta Quill PA-C [facility] [phone]  M-F  (8:30 am- 4 PM)  08/27/2017, 3:53 PM

## 2017-08-27 NOTE — Plan of Care (Signed)
Problem: Education: Goal: Knowledge of [place] [facility] Education information/materials will improve Outcome: Progressing Initiate care plan- Every intervention explain to pt prior implementation- No family at bedside - patient is obtunded.

## 2017-08-27 NOTE — Progress Notes (Addendum)
Neuro status remains the same- will continue to monitor

## 2017-08-27 NOTE — Progress Notes (Signed)
PROGRESS NOTE    [name] [name]  ZOX:[id number] DOB: 1941/09/20 DOA: 08/26/2017 PCP: [name], MD   Brief Narrative:  76 y.o. WF PMHx CVA(right posterior temporo-occipital intra-axial hemorrhage), Seizures, Depression, Anemia, Chronic back pain, CKD stage III, HLD, HTN, Diabetes type 2 controlled with complication, Hip fracture, Level 5 caveat applies.   Patient unable to provide any history due to being somnolent due to being post ictal from extended seizure time. History provided by patient's husband who is her primary caregiver. Patient was in her normal state of health until onset of seizure on day of admission. Patient's husband reports waking up in bed in the middle of night to find his wife having a seizure. She was shaking all over. She was unresponsive. Reports shaking for approximately 30-45 minutes. Relieved w/ Ativan. Denies any recent change in her baseline health status including fevers, chest pain, shortness breath, palpitations, dysuria, frequency, flank pain, fevers, cough, neck stiffness, focal neurological deficits. Reports patient doing fairly well from a rehabilitation standpoint after recent hip fracture. Using tramadol for pain. She is not previously on tramadol.   ED Course: Patient brought in in status. Given Ativan and loading dose of Keppra    Subjective: 10/25 A/O 0 (patient does know she is in the hospital), other than not only responds to painful stimuli.   Assessment & Plan:   Active Problems:   GERD (gastroesophageal reflux disease)   DM type 2 (diabetes mellitus, type 2) (HCC)   HTN (hypertension)   CKD (chronic kidney disease) stage 3, GFR 30-59 ml/min (HCC)   Acute encephalopathy   Seizure disorder, grand mal ([place])   UTI (urinary tract infection)   Sepsis, unspecified organism ([place])   Status epilepticus ([place])   S/P right hip fracture   Seizure disorder - Known history of seizure. 30-45 min of szr prior to arrival. Exline-Depakote level  normal -Depakote level normal.  -Recently started her hip pain lowers seizure threshold, documents status epilepticus would be caused by tramadol use. -Infectious process?  -Keppra 1.5 g IV given in ED - Continue Depakote 750 mg daily - Seizure precautions -- DC tramadol, DC all narcotics. - EEG: Positive see results below - Neurology following -10/25 UDS: Negative   Sepsis unspecified organism: -Upon admission meets criteria for sepsis Temperature> 30C, HR> 90, RR> 20. Altered mental status. -started on prophylactic antibiotics -D5-0.9% saline@125ml /hr -Panculture pending, including CSF - Leukocytosis -Demargination vs true infection?  Chronic Diastolic CHF -Strict in and out -Daily weight  Pulmonary HTN -See CHF   Essential HTN -Hypotensive on presentation see sepsis  -Hold all BP medication   CAD -When patient more alert statin, ASA   CKD stage III(baseline Cr~ 1.25)    Recent Labs Lab 08/26/17 0345 08/26/17 0355 08/27/17 0400  CREATININE 1.92* 1.80* 1.28*  -Resolving with hydration    RIGHT Hip Fracture:  Underwent right total hip arthroplasty through direct anterior approach on 07/29/2017 by Dr. Ninfa Linden. Patient initially was admitted to rehabilitation facility but has been home for the last couple of weeks doing intermittent PT. This is not going well per patient's husband. - PT/OT when appropriate - Ibuprofen, tylenol - Consider inpatient rehabilitation consultation.   Diabetes Type 2 controlled with Renal complications: -81/19 Hemoglobin A1c= 6.5 -Hold all diabetic medication secondary to episodes of hypoglycemia  Hypoglycemia -Per RN overnight multiple episodes of hypoglycemia hold Levemir, hold NovoLog   DVT prophylaxis: Subcutaneous heparin Code Status: Full Family Communication: None Disposition Plan: TBD   Consultants:  Neurology  Procedures/Significant Events:  11/24 FBP:ZWCHENID diffuse background slowing-indicates diffuse cerebral  dysfunction that is non-specific in etiology and can be seen with hypoxic/ischemic injury, toxic/metabolic encephalopathies, or medication effect.  The absence of epileptiform discharges does not rule out a clinical diagnosis of epilepsy 11/24 CT head WO contrast: Negative acute intracranial pathology 10/25 LP     I have personally reviewed and interpreted all radiology studies and my findings are as above.  VENTILATOR SETTINGS:   Cultures 10/24 blood pending 10/25 urine pending 10/25 MRSA by PCR positive 10/25 LP pending   Antimicrobials: Anti-infectives    Start     Stop   08/28/17 2030  vancomycin (VANCOCIN) IVPB 1000 mg/200 mL premix         08/28/17 0230  piperacillin-tazobactam (ZOSYN) IVPB 3.375 g         08/27/17 2030  piperacillin-tazobactam (ZOSYN) IVPB 3.375 g         08/27/17 2030  vancomycin (VANCOCIN) 1,250 mg in sodium chloride 0.9 % 250 mL IVPB         08/27/17 0900  cefTRIAXone (ROCEPHIN) 1 g in dextrose 5 % 50 mL IVPB  Status:  Discontinued     08/27/17 1911   08/26/17 0900  cefTRIAXone (ROCEPHIN) 2 g in dextrose 5 % 50 mL IVPB     08/26/17 1128   08/26/17 0400  vancomycin (VANCOCIN) IVPB 1000 mg/200 mL premix     08/26/17 0509   08/26/17 0400  piperacillin-tazobactam (ZOSYN) IVPB 3.375 g     08/26/17 0452       Devices    LINES / TUBES:      Continuous Infusions: . sodium chloride 100 mL/hr at 08/26/17 1157  . cefTRIAXone (ROCEPHIN)  IV       Objective: Vitals:   08/27/17 0400 08/27/17 0450 08/27/17 0500 08/27/17 0727  BP: (!) 141/63 137/70 (!) 164/100 140/75  Pulse: (!) 115 (!) 116 (!) 125   Resp: (!) 23 (!) 29 (!) 24   Temp:      TempSrc:      SpO2: 99% 96% 95% 100%  Weight:  139 lb 14.4 oz (63.5 kg)    Height:        Intake/Output Summary (Last 24 hours) at 08/27/17 0802 Last data filed at 08/27/17 0500  Gross per 24 hour  Intake             1805 ml  Output             1925 ml  Net             -120 ml   Filed Weights    08/26/17 0341 08/27/17 0450  Weight: 130 lb (59 kg) 139 lb 14.4 oz (63.5 kg)    Examination:  General: A/O 0 (does not she is in hospital), mumbles unintelligibly, No acute respiratory distress Neck:  Negative scars, masses, torticollis, lymphadenopathy, JVD Lungs: Clear to auscultation bilaterally without wheezes or crackles Cardiovascular: Tachycardic, Regular rhythm without murmur gallop or rub normal S1 and S2 Abdomen: Tachypnea, negative abdominal pain, nondistended, positive soft, bowel sounds, no rebound, no ascites, no appreciable mass Extremities: No significant cyanosis, clubbing, or edema bilateral lower extremities Skin: Negative rashes, lesions, ulcers Psychiatric:  Unable to assess secondary to altered mental status  Central nervous system:  Withdraws to painful stimuli. Unable to assess further secondary to cultural status   .     Data Reviewed: Care during the described time interval was provided by me .  I have reviewed this patient's  available data, including medical history, events of note, physical examination, and all test results as part of my evaluation.   CBC:  Recent Labs Lab 08/26/17 0345 08/26/17 0355 08/27/17 0400  WBC 20.5*  --  31.6*  NEUTROABS 16.5*  --   --   HGB 12.0 13.9 11.9*  HCT 38.6 41.0 36.7  MCV 102.7*  --  102.8*  PLT 241  --  PLATELET CLUMPS NOTED ON SMEAR, UNABLE TO ESTIMATE   Basic Metabolic Panel:  Recent Labs Lab 08/26/17 0345 08/26/17 0355 08/26/17 0857 08/27/17 0400  NA 133* 135  --  135  K 3.8 3.9  --  3.9  CL 89* 92*  --  99*  CO2 26  --   --  25  GLUCOSE 273* 268*  --  96  BUN 25* 27*  --  18  CREATININE 1.92* 1.80*  --  1.28*  CALCIUM 9.1  --   --  8.3*  MG  --   --  1.9  --   PHOS  --   --  2.1*  --    GFR: Estimated Creatinine Clearance: 32.3 mL/min (A) (by C-G formula based on SCr of 1.28 mg/dL (H)). Liver Function Tests:  Recent Labs Lab 08/26/17 0345 08/27/17 0400  AST 30 41  ALT 11* 12*    ALKPHOS 165* 135*  BILITOT 0.7 0.4  PROT 7.3 6.6  ALBUMIN 3.2* 2.6*   No results for input(s): LIPASE, AMYLASE in the last 168 hours.  Recent Labs Lab 08/26/17 0857  AMMONIA 24   Coagulation Profile:  Recent Labs Lab 08/26/17 0857  INR 1.11   Cardiac Enzymes:  Recent Labs Lab 08/26/17 0857 08/26/17 1902 08/26/17 2332  CKTOTAL  --  204  --   TROPONINI <0.03 0.03* 0.03*   BNP (last 3 results) No results for input(s): PROBNP in the last 8760 hours. HbA1C:  Recent Labs  08/26/17 0857  HGBA1C 6.5*   CBG:  Recent Labs Lab 08/26/17 1743 08/27/17 0047 08/27/17 0059 08/27/17 0447 08/27/17 0735  GLUCAP 125* 44* 41* 85 31*   Lipid Profile: No results for input(s): CHOL, HDL, LDLCALC, TRIG, CHOLHDL, LDLDIRECT in the last 72 hours. Thyroid Function Tests: No results for input(s): TSH, T4TOTAL, FREET4, T3FREE, THYROIDAB in the last 72 hours. Anemia Panel: No results for input(s): VITAMINB12, FOLATE, FERRITIN, TIBC, IRON, RETICCTPCT in the last 72 hours. Urine analysis:    Component Value Date/Time   COLORURINE YELLOW 08/26/2017 0345   APPEARANCEUR CLEAR 08/26/2017 0345   LABSPEC 1.015 08/26/2017 0345   PHURINE 6.0 08/26/2017 0345   GLUCOSEU 50 (A) 08/26/2017 0345   HGBUR NEGATIVE 08/26/2017 0345   BILIRUBINUR NEGATIVE 08/26/2017 0345   BILIRUBINUR neg 08/16/2012 1531   KETONESUR 5 (A) 08/26/2017 0345   PROTEINUR 30 (A) 08/26/2017 0345   UROBILINOGEN 0.2 06/25/2013 1002   NITRITE NEGATIVE 08/26/2017 0345   LEUKOCYTESUR TRACE (A) 08/26/2017 0345   Sepsis Labs: @LABRCNTIP (procalcitonin:4,lacticidven:4)  ) Recent Results (from the past 240 hour(s))  MRSA PCR Screening     Status: Abnormal   Collection Time: 08/27/17  3:47 AM  Result Value Ref Range Status   MRSA by PCR POSITIVE (A) NEGATIVE Final    Comment:        The GeneXpert MRSA Assay (FDA approved for NASAL specimens only), is one component of a comprehensive MRSA colonization surveillance  program. It is not intended to diagnose MRSA infection nor to guide or monitor treatment for MRSA infections. RESULT CALLED TO, READ  BACK BY AND VERIFIED WITH: R.PIVERJER RN 08/27/17 0724 L.CHAMPION          Radiology Studies: Ct Head Wo Contrast  Result Date: 08/26/2017 CLINICAL DATA:  Acute onset of altered level of consciousness. Initial encounter. EXAM: CT HEAD WITHOUT CONTRAST TECHNIQUE: Contiguous axial images were obtained from the base of the skull through the vertex without intravenous contrast. COMPARISON:  CT of the head performed 06/11/2017 FINDINGS: Brain: No evidence of acute infarction, hemorrhage, hydrocephalus, extra-axial collection or mass lesion/mass effect. Prominence of the ventricles and sulci reflects mild to moderate cortical volume loss. Mild periventricular and subcortical white matter change likely reflects small vessel ischemic microangiopathy. The brainstem and fourth ventricle are within normal limits. The basal ganglia are unremarkable in appearance. The cerebral hemispheres demonstrate grossly normal gray-white differentiation. No mass effect or midline shift is seen. Vascular: No hyperdense vessel or unexpected calcification. Skull: There is no evidence of fracture; visualized osseous structures are unremarkable in appearance. Sinuses/Orbits: The orbits are within normal limits. The paranasal sinuses and mastoid air cells are well-aerated. Other: A 2.4 cm soft tissue nodule is noted at the left posterior nose, superficial to the medial left maxilla. This has increased mildly in size over the last few years. Cerumen is noted at the external auditory canals bilaterally. IMPRESSION: 1. No acute intracranial pathology seen on CT. 2. Mild to moderate cortical volume loss and scattered small vessel ischemic microangiopathy. 3. 2.4 cm soft tissue nodule at the left posterior nose, superficial to the medial left maxilla. This has increased mildly in size over the last few  years. 4. Cerumen at the external auditory canals bilaterally. Electronically Signed   By: Garald Balding M.D.   On: 08/26/2017 04:52   Dg Chest Port 1 View  Result Date: 08/26/2017 CLINICAL DATA:  Acute onset of fever.  Initial encounter. EXAM: PORTABLE CHEST 1 VIEW COMPARISON:  Chest radiograph performed 07/27/2017 FINDINGS: The lungs are well-aerated and clear. There is no evidence of focal opacification, pleural effusion or pneumothorax. The cardiomediastinal silhouette is within normal limits. No acute osseous abnormalities are seen. IMPRESSION: No acute cardiopulmonary process seen. Electronically Signed   By: Garald Balding M.D.   On: 08/26/2017 04:22        Scheduled Meds: . aspirin  325 mg Oral Q breakfast  . atorvastatin  10 mg Oral Daily  . divalproex  750 mg Oral Daily  . heparin  5,000 Units Subcutaneous Q8H  . insulin aspart  0-9 Units Subcutaneous TID WC  . insulin detemir  8 Units Subcutaneous Q2200  . pantoprazole  40 mg Oral Daily   Continuous Infusions: . sodium chloride 100 mL/hr at 08/26/17 1157  . cefTRIAXone (ROCEPHIN)  IV       LOS: 1 day    Time spent: 40 minutes    WOODS, Geraldo Docker, MD [facility] Pager [phone]   If 7PM-7AM, please contact night-coverage [email or web address] Password [email or web address] 08/27/2017, 8:02 AM

## 2017-08-28 LAB — CBC WITH DIFFERENTIAL/PLATELET
BAND NEUTROPHILS: 16 %
BASOS ABS: 0 10*3/uL (ref 0.0–0.1)
BASOS PCT: 0 %
BLASTS: 0 %
EOS ABS: 0.2 10*3/uL (ref 0.0–0.7)
Eosinophils Relative: 1 %
HEMATOCRIT: 36.4 % (ref 36.0–46.0)
HEMOGLOBIN: 11.7 g/dL — AB (ref 12.0–15.0)
Lymphocytes Relative: 4 %
Lymphs Abs: 0.9 10*3/uL (ref 0.7–4.0)
MCH: 32.6 pg (ref 26.0–34.0)
MCHC: 32.1 g/dL (ref 30.0–36.0)
MCV: 101.4 fL — ABNORMAL HIGH (ref 78.0–100.0)
METAMYELOCYTES PCT: 0 %
MONO ABS: 0.9 10*3/uL (ref 0.1–1.0)
MYELOCYTES: 0 %
Monocytes Relative: 4 %
Neutro Abs: 21 10*3/uL — ABNORMAL HIGH (ref 1.7–7.7)
Neutrophils Relative %: 75 %
OTHER: 0 %
PROMYELOCYTES ABS: 0 %
Platelets: 151 10*3/uL (ref 150–400)
RBC: 3.59 MIL/uL — ABNORMAL LOW (ref 3.87–5.11)
RDW: 15.3 % (ref 11.5–15.5)
WBC: 23 10*3/uL — ABNORMAL HIGH (ref 4.0–10.5)
nRBC: 0 /100 WBC

## 2017-08-28 LAB — COMPREHENSIVE METABOLIC PANEL
ALBUMIN: 2.3 g/dL — AB (ref 3.5–5.0)
ALK PHOS: 155 U/L — AB (ref 38–126)
ALT: 13 U/L — ABNORMAL LOW (ref 14–54)
AST: 29 U/L (ref 15–41)
Anion gap: 10 (ref 5–15)
BILIRUBIN TOTAL: 0.5 mg/dL (ref 0.3–1.2)
BUN: 8 mg/dL (ref 6–20)
CALCIUM: 8.6 mg/dL — AB (ref 8.9–10.3)
CO2: 29 mmol/L (ref 22–32)
CREATININE: 1.16 mg/dL — AB (ref 0.44–1.00)
Chloride: 102 mmol/L (ref 101–111)
GFR calc Af Amer: 52 mL/min — ABNORMAL LOW (ref >60)
GFR, EST NON AFRICAN AMERICAN: 45 mL/min — AB (ref >60)
GLUCOSE: 123 mg/dL — AB (ref 65–99)
Potassium: 3.2 mmol/L — ABNORMAL LOW (ref 3.5–5.1)
Sodium: 141 mmol/L (ref 135–145)
TOTAL PROTEIN: 6.4 g/dL — AB (ref 6.5–8.1)

## 2017-08-28 LAB — MAGNESIUM: MAGNESIUM: 1.3 mg/dL — AB (ref 1.7–2.4)

## 2017-08-28 LAB — URINE CULTURE: Culture: NO GROWTH

## 2017-08-28 LAB — GLUCOSE, CAPILLARY
GLUCOSE-CAPILLARY: 41 mg/dL — AB (ref 65–99)
Glucose-Capillary: 131 mg/dL — ABNORMAL HIGH (ref 65–99)
Glucose-Capillary: 147 mg/dL — ABNORMAL HIGH (ref 65–99)
Glucose-Capillary: 139 mg/dL — ABNORMAL HIGH (ref 65–99)
Glucose-Capillary: 123 mg/dL — ABNORMAL HIGH (ref 65–99)

## 2017-08-28 LAB — AMMONIA: AMMONIA: 88 umol/L — AB (ref 9–35)

## 2017-08-28 LAB — LACTIC ACID, PLASMA: LACTIC ACID, VENOUS: 0.8 mmol/L (ref 0.5–1.9)

## 2017-08-28 MED ORDER — IBUPROFEN 200 MG PO TABS: 400.0000 mg | ORAL_TABLET | Freq: Four times a day (QID) | ORAL | Status: DC | PRN

## 2017-08-28 MED ORDER — POTASSIUM CHLORIDE 10 MEQ/50ML IV SOLN
10.0000 meq | INTRAVENOUS | Status: DC
  Filled 2017-08-28 (×3): qty 50

## 2017-08-28 MED ORDER — ACETAMINOPHEN 325 MG PO TABS
650.0000 mg | ORAL_TABLET | Freq: Four times a day (QID) | ORAL | Status: DC | PRN
  Administered 2017-09-02: 650 mg via ORAL
  Filled 2017-08-28: qty 2

## 2017-08-28 MED ORDER — ASPIRIN 325 MG PO TABS
325.0000 mg | ORAL_TABLET | Freq: Every day | ORAL | Status: DC
  Administered 2017-09-03 – 2017-09-04 (×2): 325 mg via ORAL
  Filled 2017-08-28 (×3): qty 1

## 2017-08-28 MED ORDER — POTASSIUM CHLORIDE 10 MEQ/100ML IV SOLN
10.0000 meq | INTRAVENOUS | Status: AC
  Administered 2017-08-28 (×2): 10 meq via INTRAVENOUS
  Filled 2017-08-28 (×2): qty 100

## 2017-08-28 MED ORDER — MAGNESIUM SULFATE 2 GM/50ML IV SOLN
2.0000 g | Freq: Once | INTRAVENOUS | Status: AC
  Administered 2017-08-28: 2 g via INTRAVENOUS
  Filled 2017-08-28: qty 50

## 2017-08-28 MED ORDER — VALPROATE SODIUM 500 MG/5ML IV SOLN
750.0000 mg | Freq: Two times a day (BID) | INTRAVENOUS | Status: DC
  Administered 2017-08-28 – 2017-09-01 (×10): 750 mg via INTRAVENOUS
  Filled 2017-08-28 (×11): qty 7.5

## 2017-08-28 MED ORDER — POTASSIUM CHLORIDE CRYS ER 20 MEQ PO TBCR: 40.0000 meq | EXTENDED_RELEASE_TABLET | Freq: Once | ORAL | Status: DC

## 2017-08-28 MED ORDER — ACETAMINOPHEN 650 MG RE SUPP
650.0000 mg | Freq: Four times a day (QID) | RECTAL | Status: DC | PRN
  Administered 2017-08-30 – 2017-08-31 (×3): 650 mg via RECTAL
  Filled 2017-08-28 (×3): qty 1

## 2017-08-28 MED ORDER — ENSURE ENLIVE PO LIQD
237.0000 mL | Freq: Two times a day (BID) | ORAL | Status: DC
Start: 2017-08-29 — End: 2017-09-01
  Administered 2017-08-29: 237 mL via ORAL

## 2017-08-28 MED ORDER — PANTOPRAZOLE SODIUM 40 MG PO TBEC: 40.0000 mg | DELAYED_RELEASE_TABLET | Freq: Every day | ORAL | Status: DC

## 2017-08-28 NOTE — Progress Notes (Signed)
Initial Nutrition Assessment  DOCUMENTATION CODES:   Severe malnutrition in context of chronic illness  INTERVENTION:   -Ensure Enlive TID; each supplement provides 350 kcals and 20 grams of protein  NUTRITION DIAGNOSIS:   Severe Malnutrition related to chronic illness, poor appetite (hip fracture recovery, poor PO intake) as evidenced by energy intake < 75% for > or equal to 1 month, percent weight loss, per patient/family report, moderate fat depletion, moderate muscle depletion.  GOAL:   Patient will meet greater than or equal to 90% of their needs  MONITOR:   PO intake, Supplement acceptance, Labs, Weight trends  REASON FOR ASSESSMENT:   Low [place]   ASSESSMENT:   Pt is a 76 year old female admitted for a seizure and AMS. PMH of anemia, CKD, HTN, HLD, DM, GERD, depression and a hip fracture.  Pt was sleeping upon visit but pts husband was available at bedside for questions. He reports that pts appetite has significantly decreased since August of this year. He feels that this is d/t her hip fracture and recovery.  He reports that he helps feed his wife as she has a tremor that makes eating difficult to do on her own. She is also bed bound.  Husband typically makes bacon and eggs for breakfast and says many days that's all she will eat. He encourages her PO and fluid intake but says that she feels tired and refuses. Pt does not drink nutritional supplements at home but husband thinks she would try it once appropriate. He states pt has not had anything to eat or drink since admission on 10/24. She is on a regular diet.  He reports that pt has lost 17-18 lbs since August 2018 and that her UBW is 145 lbs. Per weight records in chart, pt weighed 137 lbs on 08/28/17, 145 lbs on 06/12/17, and 151 lbs on 06/10/17. This indicates a 9% weight loss in 2.5 months, severe for time frame.  Medications: Protonix, Heparin  Labs: CBGs:171-131-147, K 3.2 (L), Cr 1.16 (H), Ca 8.6 (L), Mg 1.3  (L), GFR 45 (L)  NUTRITION - FOCUSED PHYSICAL EXAM:  Mild fat depletion in orbital and thoracic regions. Moderate fat depletion in buccal region. Mild muscle depletion in temple, clavicle, and acromion regions. Moderate muscle depletion in dorsal hand.   Diet Order:  Diet regular Room service appropriate? Yes; Fluid consistency: Thin  EDUCATION NEEDS:   No education needs have been identified at this time  Skin:  Skin Assessment: Reviewed RN Assessment  Last BM:  10/26  Height:   Ht Readings from Last 1 Encounters:  08/26/17 5\' 4"  (1.626 m)    Weight:   Wt Readings from Last 1 Encounters:  08/28/17 137 lb 8 oz (62.4 kg)    Ideal Body Weight:  54.5 kg  BMI:  Body mass index is 23.6 kg/m.  Estimated Nutritional Needs:   Kcal:  1300-1500 kcals  Protein:  65-75 grams   Fluid:  1.3-1.5 [place] [facility] Intern Pager: [phone] 08/28/2017 4:14 PM

## 2017-08-28 NOTE — Progress Notes (Signed)
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 It was relieved only when she was given Ativan in the ED.   Subjective: The patient is sleeping comfortably at this time.  Her nurse reports that early this morning she was quite encephalopathic but this improved significantly for a period this afternoon.  At this time there is no evidence of respiratory distress or uncontrolled pain.  Assessment & Plan:  Seizure disorder Care/medications as per neurology  Sepsis unspecified organism Fever curve is trending down - WBC on possible downward trend - continue empiric antibiotic - LP studies not suggestive of meningitis - follow LP cultures - Flu screen negative - no clear source of infection presently   Encephalopathy ?postictal versus septic encephalopathy - LP 10/25 per Neuro - ammonia normal initially but elevated at 88 today - check B12 and folate   Chronic Diastolic CHF No significant volume overload at this time  Essential HTN Blood pressure reasonably controlled at this time  CAD  CKD stage III Renal function stable  Recent Labs Lab 08/26/17 0345 08/26/17 0355 08/27/17 0400 08/27/17 1823 08/28/17 0752  CREATININE [name].92* [name].80* [name].28* [name].16* [name].16*    Hypokalemia Due to poor intake - supplement and follow  Hypomagnesemia Due to poor intake - supplement and follow  RIGHT Hip Fracture:  Underwent right total hip arthroplasty 07/29/2017 by Dr. Ninfa Linden.  DM2 w/ hypoglycemia  10/24 A1c 6.5 - CBG currently well-controlled  MRSA screen +  DVT prophylaxis: SQ heparin Code Status: FULL CODE Family Communication: no family present at  time of exam  Disposition Plan: SDU  Consultants:  Neurology   Antimicrobials:  Zosyn 10/23 > Vancomycin 10/23 > Ceftriaxone 10/24 > 10/25   Objective: Blood pressure 135/76, pulse (!) 122, temperature 98.6 F (37 C), temperature source Oral, resp. rate 20, height 5\' 4"  ([name].626 m), weight 62.4 kg (137 lb 8 oz), SpO2 100 %.  Intake/Output Summary (Last 24 hours) at 08/28/17 1709 Last data filed at 08/28/17 0714  Gross per 24 hour  Intake           2907.5 ml  Output             1475 ml  Net           1432.5 ml   Filed Weights   08/26/17 0341 08/27/17 0450 08/28/17 0403  Weight: 59 kg (130 lb) 63.5 kg (139 lb 14.4 oz) 62.4 kg (137 lb 8 oz)    Examination: General: No acute respiratory distress Lungs: Clear to auscultation bilaterally without wheezes or crackles Cardiovascular: Regular rate and rhythm without murmur gallop or rub normal S1 and S2 Abdomen: Nontender, nondistended, soft, bowel sounds positive, no rebound, no ascites, no appreciable mass Extremities: No significant cyanosis, clubbing, or edema bilateral lower extremities  CBC:  Recent Labs Lab 08/26/17 0345 08/26/17 0355 08/27/17 0400 08/27/17 1823 08/28/17 0752  WBC 20.5*  --  31.6* 24.6* 23.0*  NEUTROABS 16.5*  --   --   --  21.0*  HGB 12.0 13.9 11.9* 8.9* 11.7*  HCT 38.6 41.0 36.7 28.5* 36.4  MCV 102.7*  --  102.8* 102.9* 101.4*  PLT 241  --  PLATELET CLUMPS NOTED ON  SMEAR, UNABLE TO ESTIMATE 122* 235   Basic Metabolic Panel:  Recent Labs Lab 08/26/17 0345 08/26/17 0355 08/26/17 0857 08/27/17 0400 08/27/17 1823 08/28/17 0752  NA 133* 135  --  135 135 141  K 3.8 3.9  --  3.9 2.6* 3.2*  CL 89* 92*  --  99* 100* 102  CO2 26  --   --  25 23 29   GLUCOSE 273* 268*  --  96 733* 123*  BUN 25* 27*  --  18 14 8   CREATININE [name].92* [name].80*  --  [name].28* [name].16* [name].16*  CALCIUM 9.[name]  --   --  8.3* 6.6* 8.6*  MG  --   --  [name].9  --  [name].3* [name].3*  PHOS  --   --  2.[name]*  --   --   --    GFR: Estimated Creatinine  Clearance: 35.6 mL/min (A) (by C-G formula based on SCr of [name].16 mg/dL (H)).  Liver Function Tests:  Recent Labs Lab 08/26/17 0345 08/27/17 0400 08/28/17 0752  AST 30 41 29  ALT 11* 12* 13*  ALKPHOS 165* 135* 155*  BILITOT 0.7 0.4 0.5  PROT 7.3 6.6 6.4*  ALBUMIN 3.2* 2.6* 2.3*    Recent Labs Lab 08/26/17 0857 08/28/17 0752  AMMONIA 24 88*    Coagulation Profile:  Recent Labs Lab 08/26/17 0857  INR [name].11    Cardiac Enzymes:  Recent Labs Lab 08/26/17 0857 08/26/17 1902 08/26/17 2332  CKTOTAL  --  204  --   TROPONINI <0.03 0.03* 0.03*    HbA1C: Hgb A1c MFr Bld  Date/Time Value Ref Range Status  08/26/2017 08:57 AM 6.5 (H) 4.8 - 5.6 % Final    Comment:    (NOTE) Pre diabetes:          5.7%-6.4% Diabetes:              >6.4% Glycemic control for   <7.0% adults with diabetes   06/07/2017 04:19 AM 7.6 (H) 4.8 - 5.6 % Final    Comment:    (NOTE)         Pre-diabetes: 5.7 - 6.4         Diabetes: >6.4         Glycemic control for adults with diabetes: <7.0     CBG:  Recent Labs Lab 08/27/17 1621 08/27/17 2058 08/28/17 0754 08/28/17 1201 08/28/17 1621  GLUCAP 160* 171* 131* 147* 139*    Recent Results (from the past 240 hour(s))  Blood Culture (routine x 2)     Status: None (Preliminary result)   Collection Time: 08/26/17  3:45 AM  Result Value Ref Range Status   Specimen Description BLOOD RIGHT ANTECUBITAL  Final   Special Requests   Final    BOTTLES DRAWN AEROBIC AND ANAEROBIC Blood Culture adequate volume   Culture NO GROWTH 2 DAYS  Final   Report Status PENDING  Incomplete  Blood Culture (routine x 2)     Status: None (Preliminary result)   Collection Time: 08/26/17  4:16 AM  Result Value Ref Range Status   Specimen Description BLOOD LEFT HAND  Final   Special Requests IN PEDIATRIC BOTTLE Blood Culture adequate volume  Final   Culture NO GROWTH 2 DAYS  Final   Report Status PENDING  Incomplete  MRSA PCR Screening     Status: Abnormal    Collection Time: 08/27/17  3:47 AM  Result Value Ref Range Status   MRSA by PCR POSITIVE (A) NEGATIVE Final  Comment:        The GeneXpert MRSA Assay (FDA approved for NASAL specimens only), is one component of a comprehensive MRSA colonization surveillance program. It is not intended to diagnose MRSA infection nor to guide or monitor treatment for MRSA infections. RESULT CALLED TO, READ BACK BY AND VERIFIED WITH: R.PIVERJER RN 08/27/17 0724 L.CHAMPION   Culture, Urine     Status: None   Collection Time: 08/27/17 10:47 AM  Result Value Ref Range Status   Specimen Description URINE, CATHETERIZED  Final   Special Requests NONE  Final   Culture NO GROWTH  Final   Report Status 08/28/2017 FINAL  Final  CSF culture with Stat gram stain     Status: None (Preliminary result)   Collection Time: 08/27/17  3:44 PM  Result Value Ref Range Status   Specimen Description CSF  Final   Special Requests Normal  Final   Gram Stain   Final    WBC PRESENT, PREDOMINANTLY MONONUCLEAR NO ORGANISMS SEEN CYTOSPIN SMEAR    Culture NO GROWTH < 24 HOURS  Final   Report Status PENDING  Incomplete     Scheduled Meds: . aspirin  300 mg Rectal Daily  . atorvastatin  10 mg Oral Daily  . Chlorhexidine Gluconate Cloth  6 each Topical Q0600  . [START ON 08/29/2017] feeding supplement (ENSURE ENLIVE)  237 mL Oral BID BM  . heparin  5,000 Units Subcutaneous Q8H  . mupirocin ointment  [name] application Nasal BID  . pantoprazole (PROTONIX) IV  40 mg Intravenous Daily   Continuous Infusions: . dextrose 5 % and 0.9% NaCl 125 mL/hr at 08/27/17 1738  . piperacillin-tazobactam (ZOSYN)  IV Stopped (08/28/17 1430)  . valproate sodium Stopped (08/28/17 1200)  . vancomycin       LOS: 2 days   Cherene Altes, MD [facility] Office  [phone] Pager - Text Page per Amion as per below:  On-Call/Text Page:      [email or web address]      password [email or web address]  If 7PM-7AM, please contact  night-coverage [email or web address] Password [email or web address] 08/28/2017, 5:09 PM

## 2017-08-28 NOTE — Progress Notes (Signed)
Subjective: She is arousable but more confused and drowsy than yesterday afternoon. She remains afebrile since yesterday morning around 10am. Her husband states she looks similar this morning to what he saw yesterday morning.  Exam: Vitals:   08/28/17 0758 08/28/17 1203  BP: 115/77 (!) 153/62  Pulse:    Resp:    Temp: 97.7 F (36.5 C) 97.6 F (36.4 C)  SpO2: 100% 100%   Gen: In bed, NAD Resp: non-labored breathing, no acute distress Neuro: MS: Asleep but awakens with difficulty to voice, soft voice responds to some questions but not others CN: II - Visual fields intact. Attends to face on both sides. III, IV, VI - Extraocular movements intact both sides. VII - Face appears symmetric VIII - Hearing & vestibular intact bilaterally. Motor: She is shaking slightly and does not raise arms but squeezes hands 5/5, wiggles toes to command, upper extremities have moderate amplitude resting tremor symmetrically Sensory: Withdraws from painful stimuli throughout DTR:2+ bilaterally in lower extremities  Pertinent Labs: WBC: 24.6->23 Potassium: 2.6->3.2 CSF: colorless, rare WBCs, normal glucose, gram stain negative  Impression: She appears slightly more confused and sleepy this morning than yesterday afternoon. Vital signs and fever remain improved. This may be waxing and waning encephalopathy in the setting of infection or delirium. Cultures are negative so it is unclear what infection might be present but she has improved somewhat with empiric treatment. Less likely she suffered recurrent unwitnessed seizure overnight causing more confusion today. Her tremor on exam is most likely side effect of her valproic acid and existed before this admission. No strong indication to repeat EEG today as she does wake up and answer some questions and commands.  Recommendations: 1) Continue empiric treatment in the setting of high fever and leukocytosis, concern for septic encephalopathy 2) Continue VPA  for seizure prophylaxis  For full impression and recommendations see attending physician attestation.  Collier Salina, MD PGY-III Internal Medicine Resident Pager# [phone] 08/28/2017, 1:27 PM

## 2017-08-29 LAB — COMPREHENSIVE METABOLIC PANEL
ALK PHOS: 130 U/L — AB (ref 38–126)
ALT: 11 U/L — ABNORMAL LOW (ref 14–54)
ANION GAP: 12 (ref 5–15)
AST: 21 U/L (ref 15–41)
Albumin: 2.2 g/dL — ABNORMAL LOW (ref 3.5–5.0)
BILIRUBIN TOTAL: 0.7 mg/dL (ref 0.3–1.2)
BUN: 7 mg/dL (ref 6–20)
CALCIUM: 8.4 mg/dL — AB (ref 8.9–10.3)
CO2: 29 mmol/L (ref 22–32)
Chloride: 98 mmol/L — ABNORMAL LOW (ref 101–111)
Creatinine, Ser: 1.11 mg/dL — ABNORMAL HIGH (ref 0.44–1.00)
GFR, EST AFRICAN AMERICAN: 54 mL/min — AB (ref >60)
GFR, EST NON AFRICAN AMERICAN: 47 mL/min — AB (ref >60)
Glucose, Bld: 133 mg/dL — ABNORMAL HIGH (ref 65–99)
POTASSIUM: 3.5 mmol/L (ref 3.5–5.1)
Sodium: 139 mmol/L (ref 135–145)
TOTAL PROTEIN: 6 g/dL — AB (ref 6.5–8.1)

## 2017-08-29 LAB — CBC
HEMATOCRIT: 32.4 % — AB (ref 36.0–46.0)
HEMOGLOBIN: 10.1 g/dL — AB (ref 12.0–15.0)
MCH: 31.8 pg (ref 26.0–34.0)
MCHC: 31.2 g/dL (ref 30.0–36.0)
MCV: 101.9 fL — ABNORMAL HIGH (ref 78.0–100.0)
Platelets: 176 10*3/uL (ref 150–400)
RBC: 3.18 MIL/uL — ABNORMAL LOW (ref 3.87–5.11)
RDW: 14.7 % (ref 11.5–15.5)
WBC: 17.7 10*3/uL — AB (ref 4.0–10.5)

## 2017-08-29 LAB — GLUCOSE, CAPILLARY
GLUCOSE-CAPILLARY: 137 mg/dL — AB (ref 65–99)
GLUCOSE-CAPILLARY: 127 mg/dL — AB (ref 65–99)
GLUCOSE-CAPILLARY: 164 mg/dL — AB (ref 65–99)
Glucose-Capillary: 144 mg/dL — ABNORMAL HIGH (ref 65–99)

## 2017-08-29 LAB — FOLATE: FOLATE: 26 ng/mL (ref >5.9)

## 2017-08-29 LAB — AMMONIA: Ammonia: 36 umol/L — ABNORMAL HIGH (ref 9–35)

## 2017-08-29 LAB — VITAMIN B12: VITAMIN B 12: 1472 pg/mL — AB (ref 180–914)

## 2017-08-29 MED ORDER — POTASSIUM CHLORIDE 10 MEQ/100ML IV SOLN
10.0000 meq | INTRAVENOUS | Status: AC
  Administered 2017-08-29 (×3): 10 meq via INTRAVENOUS
  Filled 2017-08-29 (×3): qty 100

## 2017-08-29 NOTE — Progress Notes (Signed)
Subjective: Slowing more awake than yesterday morning, her husband stated that she woke up more yesterday afternoon  Exam: Vitals:   08/29/17 0806 08/29/17 1327  BP: (!) 149/67 (!) 107/52  Pulse: (!) 118 (!) 120  Resp: 18 (!) 30  Temp: 98.5 F (36.9 C) 97.6 F (36.4 C)  SpO2: 100% 100%   Gen: In bed, NAD Resp: non-labored breathing, no acute distress Abd: soft, nt  Neuro: MS: Awakens to voice, tells me her name, and still not where she is CN:vff, eomi Motor: Moves all extremity as well, resting tremor present  Impression: 76 year old female with breakthrough seizures in the setting of urinary tract infection. I suspect that this represents some degree of septic encephalopathy, but given the breakthrough seizure would favor continuing her Depakote.  Recommendations: 1) continue Depakote 750 mg twice a day  Roland Rack, MD [facility] [phone]  If 7pm- 7am, please page neurology on call as listed in [place].

## 2017-08-29 NOTE — Progress Notes (Signed)
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 It was relieved only when she was given Ativan in the ED.   Subjective: The patient is minimally interactive at the time of my visit.  Her husband states she had a period earlier today in which she was more awake and interactive.  She does not appear uncomfortable.  There is no evidence of respiratory distress.  Assessment & Plan:  Seizure disorder Care/medications as per Neurology  Sepsis unspecified organism - ?UTI Fever curve is trending down - WBC on a definite downward trend - continue empiric antibiotic - LP studies not suggestive of meningitis - follow LP cultures - Flu screen negative - no clear source of infection presently, though UA c/w possible UTI   Encephalopathy Likely both postictal and septic encephalopathy - LP 10/25 per Neuro unrevealing - ammonia normal today - B12 and folate not low - making very slow progress   Chronic Diastolic CHF No significant volume overload at this time  Essential HTN Blood pressure reasonably controlled at this time  CAD  CKD stage III Renal function stable  Recent Labs Lab 08/26/17 0355 08/27/17 0400 08/27/17 1823 08/28/17 0752 08/29/17 0408  CREATININE 1.80* 1.28* 1.16* 1.16* 1.11*    Hypokalemia Due to poor intake - cont to supplement and follow  Hypomagnesemia Due to poor intake - f/u in AM   RIGHT Hip Fracture Underwent right total hip arthroplasty 07/29/2017 by Dr. Ninfa Linden.  DM2 w/ hypoglycemia  10/24 A1c 6.5 - CBG currently controlled  MRSA screen +  DVT prophylaxis: SQ heparin Code Status: FULL CODE Family  Communication: Spoke with husband at bedside  Disposition Plan: SDU  Consultants:  Neurology   Antimicrobials:  Zosyn 10/23 > Vancomycin 10/23 > Ceftriaxone 10/24 > 10/25   Objective: Blood pressure (!) 107/52, pulse (!) 120, temperature 97.6 F (36.4 C), temperature source Axillary, resp. rate (!) 30, height 5\' 4"  (1.626 m), weight 59.6 kg (131 lb 6.4 oz), SpO2 100 %.  Intake/Output Summary (Last 24 hours) at 08/29/17 1354 Last data filed at 08/29/17 1300  Gross per 24 hour  Intake          2859.17 ml  Output             1500 ml  Net          1359.17 ml   Filed Weights   08/27/17 0450 08/28/17 0403 08/29/17 0418  Weight: 63.5 kg (139 lb 14.4 oz) 62.4 kg (137 lb 8 oz) 59.6 kg (131 lb 6.4 oz)    Examination: General: No acute respiratory distress - lethargic  Lungs: CTA B w/o wheezing or crackles  Cardiovascular: RRR w/o M  Abdomen: Nontender, nondistended, soft, bowel sounds positive, no rebound Extremities: No significant cyanosis, clubbing, or edema bilateral lower extremities  CBC:  Recent Labs Lab 08/26/17 0345 08/26/17 0355 08/27/17 0400 08/27/17 1823 08/28/17 0752 08/29/17 0408  WBC 20.5*  --  31.6* 24.6* 23.0* 17.7*  NEUTROABS 16.5*  --   --   --  21.0*  --   HGB 12.0 13.9 11.9* 8.9* 11.7* 10.1*  HCT 38.6 41.0 36.7 28.5* 36.4 32.4*  MCV 102.7*  --  102.8* 102.9* 101.4*  101.9*  PLT 241  --  PLATELET CLUMPS NOTED ON SMEAR, UNABLE TO ESTIMATE 122* 151 580   Basic Metabolic Panel:  Recent Labs Lab 08/26/17 0345 08/26/17 0355 08/26/17 0857 08/27/17 0400 08/27/17 1823 08/28/17 0752 08/29/17 0408  NA 133* 135  --  135 135 141 139  K 3.8 3.9  --  3.9 2.6* 3.2* 3.5  CL 89* 92*  --  99* 100* 102 98*  CO2 26  --   --  25 23 29 29   GLUCOSE 273* 268*  --  96 733* 123* 133*  BUN 25* 27*  --  18 14 8 7   CREATININE 1.92* 1.80*  --  1.28* 1.16* 1.16* 1.11*  CALCIUM 9.1  --   --  8.3* 6.6* 8.6* 8.4*  MG  --   --  1.9  --  1.3* 1.3*  --   PHOS  --   --  2.1*   --   --   --   --    GFR: Estimated Creatinine Clearance: 37.2 mL/min (A) (by C-G formula based on SCr of 1.11 mg/dL (H)).  Liver Function Tests:  Recent Labs Lab 08/26/17 0345 08/27/17 0400 08/28/17 0752 08/29/17 0408  AST 30 41 29 21  ALT 11* 12* 13* 11*  ALKPHOS 165* 135* 155* 130*  BILITOT 0.7 0.4 0.5 0.7  PROT 7.3 6.6 6.4* 6.0*  ALBUMIN 3.2* 2.6* 2.3* 2.2*    Recent Labs Lab 08/26/17 0857 08/28/17 0752 08/29/17 0408  AMMONIA 24 88* 36*    Coagulation Profile:  Recent Labs Lab 08/26/17 0857  INR 1.11    Cardiac Enzymes:  Recent Labs Lab 08/26/17 0857 08/26/17 1902 08/26/17 2332  CKTOTAL  --  204  --   TROPONINI <0.03 0.03* 0.03*    HbA1C: Hgb A1c MFr Bld  Date/Time Value Ref Range Status  08/26/2017 08:57 AM 6.5 (H) 4.8 - 5.6 % Final    Comment:    (NOTE) Pre diabetes:          5.7%-6.4% Diabetes:              >6.4% Glycemic control for   <7.0% adults with diabetes   06/07/2017 04:19 AM 7.6 (H) 4.8 - 5.6 % Final    Comment:    (NOTE)         Pre-diabetes: 5.7 - 6.4         Diabetes: >6.4         Glycemic control for adults with diabetes: <7.0     CBG:  Recent Labs Lab 08/28/17 1201 08/28/17 1621 08/28/17 2109 08/29/17 0747 08/29/17 1111  GLUCAP 147* 139* 123* 137* 127*    Recent Results (from the past 240 hour(s))  Blood Culture (routine x 2)     Status: None (Preliminary result)   Collection Time: 08/26/17  3:45 AM  Result Value Ref Range Status   Specimen Description BLOOD RIGHT ANTECUBITAL  Final   Special Requests   Final    BOTTLES DRAWN AEROBIC AND ANAEROBIC Blood Culture adequate volume   Culture NO GROWTH 3 DAYS  Final   Report Status PENDING  Incomplete  Blood Culture (routine x 2)     Status: None (Preliminary result)   Collection Time: 08/26/17  4:16 AM  Result Value Ref Range Status   Specimen Description BLOOD LEFT HAND  Final   Special Requests IN PEDIATRIC BOTTLE Blood Culture adequate volume  Final    Culture NO GROWTH 3 DAYS  Final   Report  Status PENDING  Incomplete  MRSA PCR Screening     Status: Abnormal   Collection Time: 08/27/17  3:47 AM  Result Value Ref Range Status   MRSA by PCR POSITIVE (A) NEGATIVE Final    Comment:        The GeneXpert MRSA Assay (FDA approved for NASAL specimens only), is one component of a comprehensive MRSA colonization surveillance program. It is not intended to diagnose MRSA infection nor to guide or monitor treatment for MRSA infections. RESULT CALLED TO, READ BACK BY AND VERIFIED WITH: R.PIVERJER RN 08/27/17 0724 L.CHAMPION   Culture, Urine     Status: None   Collection Time: 08/27/17 10:47 AM  Result Value Ref Range Status   Specimen Description URINE, CATHETERIZED  Final   Special Requests NONE  Final   Culture NO GROWTH  Final   Report Status 08/28/2017 FINAL  Final  CSF culture with Stat gram stain     Status: None (Preliminary result)   Collection Time: 08/27/17  3:44 PM  Result Value Ref Range Status   Specimen Description CSF  Final   Special Requests Normal  Final   Gram Stain   Final    WBC PRESENT, PREDOMINANTLY MONONUCLEAR NO ORGANISMS SEEN CYTOSPIN SMEAR    Culture NO GROWTH 2 DAYS  Final   Report Status PENDING  Incomplete     Scheduled Meds: . aspirin  325 mg Oral Daily  . atorvastatin  10 mg Oral Daily  . Chlorhexidine Gluconate Cloth  6 each Topical Q0600  . feeding supplement (ENSURE ENLIVE)  237 mL Oral BID BM  . heparin  5,000 Units Subcutaneous Q8H  . mupirocin ointment  1 application Nasal BID  . pantoprazole  40 mg Oral Q1200     LOS: 3 days   Cherene Altes, MD [facility] Office  [phone] Pager - Text Page per Shea Evans as per below:  On-Call/Text Page:      [email or web address]      password [email or web address]  If 7PM-7AM, please contact night-coverage [email or web address] Password [email or web address] 08/29/2017, 1:54 PM

## 2017-08-30 LAB — BASIC METABOLIC PANEL
Anion gap: 10 (ref 5–15)
BUN: 7 mg/dL (ref 6–20)
CALCIUM: 8.2 mg/dL — AB (ref 8.9–10.3)
CO2: 32 mmol/L (ref 22–32)
CREATININE: 1.17 mg/dL — AB (ref 0.44–1.00)
Chloride: 99 mmol/L — ABNORMAL LOW (ref 101–111)
GFR calc non Af Amer: 44 mL/min — ABNORMAL LOW (ref >60)
GFR, EST AFRICAN AMERICAN: 51 mL/min — AB (ref >60)
Glucose, Bld: 204 mg/dL — ABNORMAL HIGH (ref 65–99)
Potassium: 3.3 mmol/L — ABNORMAL LOW (ref 3.5–5.1)
SODIUM: 141 mmol/L (ref 135–145)

## 2017-08-30 LAB — GLUCOSE, CAPILLARY
GLUCOSE-CAPILLARY: 243 mg/dL — AB (ref 65–99)
Glucose-Capillary: 224 mg/dL — ABNORMAL HIGH (ref 65–99)
Glucose-Capillary: 228 mg/dL — ABNORMAL HIGH (ref 65–99)
Glucose-Capillary: 214 mg/dL — ABNORMAL HIGH (ref 65–99)

## 2017-08-30 LAB — CSF CULTURE W GRAM STAIN
Culture: NO GROWTH
Special Requests: NORMAL

## 2017-08-30 LAB — CBC
HCT: 31.7 % — ABNORMAL LOW (ref 36.0–46.0)
Hemoglobin: 9.7 g/dL — ABNORMAL LOW (ref 12.0–15.0)
MCH: 31.1 pg (ref 26.0–34.0)
MCHC: 30.6 g/dL (ref 30.0–36.0)
MCV: 101.6 fL — ABNORMAL HIGH (ref 78.0–100.0)
PLATELETS: 170 10*3/uL (ref 150–400)
RBC: 3.12 MIL/uL — AB (ref 3.87–5.11)
RDW: 15.1 % (ref 11.5–15.5)
WBC: 9.7 10*3/uL (ref 4.0–10.5)

## 2017-08-30 LAB — MAGNESIUM: Magnesium: 1 mg/dL — ABNORMAL LOW (ref 1.7–2.4)

## 2017-08-30 MED ORDER — MAGNESIUM SULFATE 4 GM/100ML IV SOLN
4.0000 g | Freq: Once | INTRAVENOUS | Status: AC
  Administered 2017-08-30: 4 g via INTRAVENOUS
  Filled 2017-08-30: qty 100

## 2017-08-30 MED ORDER — INSULIN ASPART 100 UNIT/ML ~~LOC~~ SOLN
0.0000 [IU] | Freq: Three times a day (TID) | SUBCUTANEOUS | Status: DC
  Administered 2017-08-31: 1 [IU] via SUBCUTANEOUS
  Administered 2017-08-31: 2 [IU] via SUBCUTANEOUS
  Administered 2017-08-31: 1 [IU] via SUBCUTANEOUS
  Administered 2017-09-02 (×2): 2 [IU] via SUBCUTANEOUS
  Administered 2017-09-03: 7 [IU] via SUBCUTANEOUS
  Administered 2017-09-03: 2 [IU] via SUBCUTANEOUS
  Administered 2017-09-03: 3 [IU] via SUBCUTANEOUS

## 2017-08-30 MED ORDER — INSULIN ASPART 100 UNIT/ML ~~LOC~~ SOLN
0.0000 [IU] | Freq: Every day | SUBCUTANEOUS | Status: DC
  Administered 2017-08-30 – 2017-09-03 (×3): 2 [IU] via SUBCUTANEOUS

## 2017-08-30 MED ORDER — POTASSIUM CHLORIDE 10 MEQ/100ML IV SOLN
10.0000 meq | INTRAVENOUS | Status: AC
  Administered 2017-08-30 – 2017-08-31 (×3): 10 meq via INTRAVENOUS
  Filled 2017-08-30 (×3): qty 100

## 2017-08-30 NOTE — Progress Notes (Signed)
Pharmacy Antibiotic Note  [name] [name] is a 76 y.o. female on vancomycin and Zosyn for sepsis, possible urinary source. All cultures have no growth so far, including urine culture. Currently afebrile, WBC down to 9.7. Scr 1.17 stable, estimated CrCl ~35 mL/min.  Plan: -Continue Zosyn 3.375gm IV q8h -Continue vancomycin 1000mg  IV q24h -Plan for VT at steady state -Monitor renal function, cultures and clinical progress   Height: 5\' 4"  (162.6 cm) Weight: 129 lb (58.5 kg) IBW/kg (Calculated) : 54.7  Temp (24hrs), Avg:97.9 F (36.6 C), Min:97.6 F (36.4 C), Max:98.4 F (36.9 C)   Recent Labs Lab 08/26/17 0355 08/26/17 0556 08/26/17 0857 08/26/17 1200 08/27/17 0400 08/27/17 1823 08/28/17 0752 08/29/17 0408 08/30/17 0450  WBC  --   --   --   --  31.6* 24.6* 23.0* 17.7* 9.7  CREATININE 1.80*  --   --   --  1.28* 1.16* 1.16* 1.11* 1.17*  LATICACIDVEN 5.59* 4.40* 2.9* 1.9  --   --  0.8  --   --     Estimated Creatinine Clearance: 35.3 mL/min (A) (by C-G formula based on SCr of 1.17 mg/dL (H)).    Allergies  Allergen Reactions  . Barbiturates     Becomes restless.  Also with all "strong" medications like sleeping pills  . Latex Itching    Antimicrobials this admission: 10/24 Rocephin >> 10/25 10/25 vancomycin >> 10/25 Zosyn >>  Dose adjustments this admission: None  Microbiology results: 10/25 CSF Cx: NGx2 10/25 UCx: neg 10/24 BCx x2: NGx3 10/25 MRSA PCR +   Thank you for allowing pharmacy to be a part of this patient's care.  Mila Merry Gerarda Fraction, PharmD PGY1 Pharmacy Resident Pager: [phone] 08/30/17 9:01 AM

## 2017-08-30 NOTE — Progress Notes (Signed)
Subjective: Currently patient appears to be uncomfortable lying supine in bed, follows no commands, withdraws from noxious stimuli briskly.  With passive range of motion appears to be in discomfort in both upper and lower extremities.  Moving all extremities spontaneously Heart rate remains elevated in the 120s  Exam: Vitals:   08/30/17 0357 08/30/17 0733  BP: (!) 135/55 140/75  Pulse: (!) 130 (!) 111  Resp: 11 (!) 21  Temp: 97.9 F (36.6 C) 97.8 F (36.6 C)  SpO2: 100% 100%    HEENT-  Normocephalic, no lesions, without obvious abnormality.  Normal external eye and conjunctiva.  Normal TM's bilaterally.  Normal auditory canals and external ears. Normal external nose, mucus membranes and septum.  Normal pharynx. Cardiovascular- S1, S2 normal, pulses palpable throughout   Lungs- chest clear, no wheezing, rales, normal symmetric air entry, Heart exam - S1, S2 normal, no murmur, no gallop, rate regular Abdomen- normal findings: bowel sounds normal    Neuro:  CN: Pupils are equal and round. They are symmetrically reactive from 3-->2 mm. EOMI without nystagmus. Facial sensation is intact to light touch. Face is symmetric at rest with normal strength and mobility. Hearing is intact to conversational voice.  Patient does not vocalize but moans, moving head left or right with no difficulty.  Appears to have no meningismus or pain with head movements.. Tongue is midline with normal bulk and mobility.  Motor:  Moving all extremities antigravity however there is a decreased tone throughout range of motion in both upper and lower extremities.  This tone increase is likely secondary to her resting tremor Sensation: Withdraws briskly from pain.    Medications:  Scheduled: . aspirin  325 mg Oral Daily  . atorvastatin  10 mg Oral Daily  . Chlorhexidine Gluconate Cloth  6 each Topical Q0600  . feeding supplement (ENSURE ENLIVE)  237 mL Oral BID BM  . heparin  5,000 Units Subcutaneous Q8H  .  mupirocin ointment  1 application Nasal BID  . pantoprazole  40 mg Oral Q1200    Pertinent Labs/Diagnostics: Sodium 141 Potassium 3.3 Glucose 204 White blood cell count has decreased from 17.7 to 9.7  No results found.   Etta Quill PA-C [facility] [phone]  On my exam, the patient is lethargic, but is able to be aroused and tells me her name.  She counts fingers in all visual fields, she follows commands.  Impression: 76 year old female with breakthrough seizure in the setting of urinary tract infection.  Currently on Depakote 750 mg IV every 12 hours.    Her encephalopathy has been waxing/waning.  My suspicion is that this is just a waxing and waning delirium, however I would favor ruling out ongoing subclinical seizures and therefore recommend an overnight EEG.  Recommendations: 1) continue Depakote 750 mg twice daily IV 2) primary team continue to treat underlying infection 3) overnight EEG with video(will be hooked up tomorrow)  Roland Rack, MD [facility] [phone]  If 7pm- 7am, please page neurology on call as listed in [place].  08/30/2017, 9:20 AM

## 2017-08-30 NOTE — Plan of Care (Signed)
Problem: Skin Integrity: Goal: Risk for impaired skin integrity will decrease Outcome: Progressing Patient repositioned every two hours.   Skin is dry and intact.

## 2017-08-30 NOTE — Progress Notes (Signed)
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 It was relieved only when she was given Ativan in the ED.   Subjective: Appears more alert, w/ eyes open, but will not attempt to communicate w/ me and does not follow simple commands.  No evidence of distress or uncontrolled pain.    Assessment & Plan:  Seizure disorder Care/medications as per Neurology  Sepsis unspecified organism - ?UTI Fever curve is trending down - WBC has normalized - continue empiric antibiotic - LP studies not suggestive of meningitis - LP cultures remain negative - Flu screen negative - no clear source of infection presently, though UA c/w possible UTI   Encephalopathy Likely both postictal and septic encephalopathy - LP 10/25 per Neuro unrevealing - ammonia normalized - B12 and folate not low - making very slow progress   Chronic Diastolic CHF No significant volume overload at this time  Essential HTN Blood pressure reasonably controlled at this time  CAD  CKD stage III Renal function stable  Recent Labs Lab 08/27/17 0400 08/27/17 1823 08/28/17 0752 08/29/17 0408 08/30/17 0450  CREATININE 1.28* 1.16* 1.16* 1.11* 1.17*    Hypokalemia Due to poor intake - cont to supplement   Hypomagnesemia Due to poor intake - increase supplementation and follow - goal is 4.0  RIGHT Hip Fracture Underwent right total hip arthroplasty 07/29/2017 by Dr. Ninfa Linden.  DM2 w/ hypoglycemia  10/24 A1c 6.5 - CBG trending upward - adjust tx and follow   MRSA screen +  DVT prophylaxis: SQ heparin Code Status: FULL CODE Family Communication: no family present today     Disposition Plan: SDU  Consultants:  Neurology   Antimicrobials:  Zosyn 10/23 > Vancomycin 10/23 > Ceftriaxone 10/24 > 10/25   Objective: Blood pressure (!) 147/56, pulse (!) 109, temperature 98.9 F (37.2 C), temperature source Axillary, resp. rate 16, height 5\' 4"  (1.626 m), weight 58.5 kg (129 lb), SpO2 97 %.  Intake/Output Summary (Last 24 hours) at 08/30/17 1649 Last data filed at 08/30/17 1427  Gross per 24 hour  Intake            632.5 ml  Output              500 ml  Net            132.5 ml   Filed Weights   08/28/17 0403 08/29/17 0418 08/30/17 0357  Weight: 62.4 kg (137 lb 8 oz) 59.6 kg (131 lb 6.4 oz) 58.5 kg (129 lb)    Examination: General: No acute respiratory distress  Lungs: CTA B - No wheezing or crackles  Cardiovascular: RRR  Abdomen: NT/ND, soft, bowel sounds positive, no rebound Extremities: No edema B LE   CBC:  Recent Labs Lab 08/26/17 0345  08/27/17 0400 08/27/17 1823 08/28/17 0752 08/29/17 0408 08/30/17 0450  WBC 20.5*  --  31.6* 24.6* 23.0* 17.7* 9.7  NEUTROABS 16.5*  --   --   --  21.0*  --   --   HGB 12.0  < > 11.9* 8.9* 11.7* 10.1* 9.7*  HCT 38.6  < > 36.7 28.5* 36.4 32.4* 31.7*  MCV 102.7*  --  102.8* 102.9* 101.4* 101.9* 101.6*  PLT 241  --  PLATELET CLUMPS NOTED ON SMEAR, UNABLE TO ESTIMATE 122* 151 176 170  < > = values in this interval not displayed. Basic Metabolic Panel:  Recent Labs Lab 08/26/17 0857 08/27/17 0400 08/27/17 1823 08/28/17 0752 08/29/17 0408 08/30/17 0450  NA  --  135 135 141 139 141  K  --  3.9 2.6* 3.2* 3.5 3.3*  CL  --  99* 100* 102 98* 99*  CO2  --  25 23 29 29  32  GLUCOSE  --  96 733* 123* 133* 204*  BUN  --  18 14 8 7 7   CREATININE  --  1.28* 1.16* 1.16* 1.11* 1.17*  CALCIUM  --  8.3* 6.6* 8.6* 8.4* 8.2*  MG 1.9  --  1.3* 1.3*  --  1.0*  PHOS 2.1*  --   --   --   --   --    GFR: Estimated Creatinine Clearance: 35.3 mL/min (A) (by C-G formula based on SCr of 1.17 mg/dL (H)).  Liver  Function Tests:  Recent Labs Lab 08/26/17 0345 08/27/17 0400 08/28/17 0752 08/29/17 0408  AST 30 41 29 21  ALT 11* 12* 13* 11*  ALKPHOS 165* 135* 155* 130*  BILITOT 0.7 0.4 0.5 0.7  PROT 7.3 6.6 6.4* 6.0*  ALBUMIN 3.2* 2.6* 2.3* 2.2*    Recent Labs Lab 08/26/17 0857 08/28/17 0752 08/29/17 0408  AMMONIA 24 88* 36*    Coagulation Profile:  Recent Labs Lab 08/26/17 0857  INR 1.11    Cardiac Enzymes:  Recent Labs Lab 08/26/17 0857 08/26/17 1902 08/26/17 2332  CKTOTAL  --  204  --   TROPONINI <0.03 0.03* 0.03*    HbA1C: Hgb A1c MFr Bld  Date/Time Value Ref Range Status  08/26/2017 08:57 AM 6.5 (H) 4.8 - 5.6 % Final    Comment:    (NOTE) Pre diabetes:          5.7%-6.4% Diabetes:              >6.4% Glycemic control for   <7.0% adults with diabetes   06/07/2017 04:19 AM 7.6 (H) 4.8 - 5.6 % Final    Comment:    (NOTE)         Pre-diabetes: 5.7 - 6.4         Diabetes: >6.4         Glycemic control for adults with diabetes: <7.0     CBG:  Recent Labs Lab 08/29/17 1648 08/29/17 2117 08/30/17 0732 08/30/17 1121 08/30/17 1613  GLUCAP 144* 164* 224* 228* 243*    Recent Results (from the past 240 hour(s))  Blood Culture (routine x 2)     Status: None (Preliminary result)   Collection Time: 08/26/17  3:45 AM  Result Value Ref Range Status   Specimen Description BLOOD RIGHT ANTECUBITAL  Final   Special Requests   Final    BOTTLES DRAWN AEROBIC AND ANAEROBIC Blood Culture adequate volume   Culture NO GROWTH 4 DAYS  Final   Report Status PENDING  Incomplete  Blood Culture (routine x 2)     Status: None (Preliminary result)   Collection Time: 08/26/17  4:16 AM  Result Value Ref Range Status   Specimen Description BLOOD LEFT HAND  Final   Special Requests IN PEDIATRIC BOTTLE Blood Culture adequate volume  Final   Culture NO GROWTH 4 DAYS  Final   Report Status PENDING  Incomplete  MRSA PCR Screening     Status: Abnormal  Collection Time:  08/27/17  3:47 AM  Result Value Ref Range Status   MRSA by PCR POSITIVE (A) NEGATIVE Final    Comment:        The GeneXpert MRSA Assay (FDA approved for NASAL specimens only), is one component of a comprehensive MRSA colonization surveillance program. It is not intended to diagnose MRSA infection nor to guide or monitor treatment for MRSA infections. RESULT CALLED TO, READ BACK BY AND VERIFIED WITH: R.PIVERJER RN 08/27/17 0724 L.CHAMPION   Culture, Urine     Status: None   Collection Time: 08/27/17 10:47 AM  Result Value Ref Range Status   Specimen Description URINE, CATHETERIZED  Final   Special Requests NONE  Final   Culture NO GROWTH  Final   Report Status 08/28/2017 FINAL  Final  CSF culture with Stat gram stain     Status: None   Collection Time: 08/27/17  3:44 PM  Result Value Ref Range Status   Specimen Description CSF  Final   Special Requests Normal  Final   Gram Stain   Final    WBC PRESENT, PREDOMINANTLY MONONUCLEAR NO ORGANISMS SEEN CYTOSPIN SMEAR    Culture NO GROWTH 3 DAYS  Final   Report Status 08/30/2017 FINAL  Final     Scheduled Meds: . aspirin  325 mg Oral Daily  . atorvastatin  10 mg Oral Daily  . Chlorhexidine Gluconate Cloth  6 each Topical Q0600  . feeding supplement (ENSURE ENLIVE)  237 mL Oral BID BM  . heparin  5,000 Units Subcutaneous Q8H  . mupirocin ointment  1 application Nasal BID  . pantoprazole  40 mg Oral Q1200     LOS: 4 days   Cherene Altes, MD [facility] Office  [phone] Pager - Text Page per Shea Evans as per below:  On-Call/Text Page:      [email or web address]      password [email or web address]  If 7PM-7AM, please contact night-coverage [email or web address] Password [email or web address] 08/30/2017, 4:49 PM

## 2017-08-31 ENCOUNTER — Inpatient Hospital Stay (HOSPITAL_COMMUNITY): Payer: Medicare Other

## 2017-08-31 LAB — GLUCOSE, CAPILLARY
GLUCOSE-CAPILLARY: 132 mg/dL — AB (ref 65–99)
Glucose-Capillary: 200 mg/dL — ABNORMAL HIGH (ref 65–99)
Glucose-Capillary: 148 mg/dL — ABNORMAL HIGH (ref 65–99)
Glucose-Capillary: 123 mg/dL — ABNORMAL HIGH (ref 65–99)

## 2017-08-31 LAB — BASIC METABOLIC PANEL
ANION GAP: 11 (ref 5–15)
BUN: <5 mg/dL — ABNORMAL LOW (ref 6–20)
CALCIUM: 8.2 mg/dL — AB (ref 8.9–10.3)
CO2: 28 mmol/L (ref 22–32)
Chloride: 101 mmol/L (ref 101–111)
Creatinine, Ser: 1.15 mg/dL — ABNORMAL HIGH (ref 0.44–1.00)
GFR, EST AFRICAN AMERICAN: 52 mL/min — AB (ref >60)
GFR, EST NON AFRICAN AMERICAN: 45 mL/min — AB (ref >60)
GLUCOSE: 194 mg/dL — AB (ref 65–99)
POTASSIUM: 3.3 mmol/L — AB (ref 3.5–5.1)
SODIUM: 140 mmol/L (ref 135–145)

## 2017-08-31 LAB — CULTURE, BLOOD (ROUTINE X 2)
Culture: NO GROWTH
Culture: NO GROWTH
SPECIAL REQUESTS: ADEQUATE
Special Requests: ADEQUATE

## 2017-08-31 LAB — MAGNESIUM: Magnesium: 2.2 mg/dL (ref 1.7–2.4)

## 2017-08-31 LAB — CBC
HEMATOCRIT: 32.5 % — AB (ref 36.0–46.0)
HEMOGLOBIN: 10 g/dL — AB (ref 12.0–15.0)
MCH: 30.8 pg (ref 26.0–34.0)
MCHC: 30.8 g/dL (ref 30.0–36.0)
MCV: 100 fL (ref 78.0–100.0)
Platelets: 170 10*3/uL (ref 150–400)
RBC: 3.25 MIL/uL — ABNORMAL LOW (ref 3.87–5.11)
RDW: 15 % (ref 11.5–15.5)
WBC: 8.7 10*3/uL (ref 4.0–10.5)

## 2017-08-31 MED ORDER — METOPROLOL TARTRATE 5 MG/5ML IV SOLN
5.0000 mg | Freq: Four times a day (QID) | INTRAVENOUS | Status: DC
  Administered 2017-08-31 – 2017-09-06 (×24): 5 mg via INTRAVENOUS
  Filled 2017-08-31 (×25): qty 5

## 2017-08-31 MED ORDER — INSULIN DETEMIR 100 UNIT/ML ~~LOC~~ SOLN
8.0000 [IU] | Freq: Every day | SUBCUTANEOUS | Status: DC
  Administered 2017-08-31: 8 [IU] via SUBCUTANEOUS
  Filled 2017-08-31 (×2): qty 0.08

## 2017-08-31 MED ORDER — POTASSIUM CHLORIDE 10 MEQ/100ML IV SOLN
10.0000 meq | INTRAVENOUS | Status: AC
  Administered 2017-08-31 (×4): 10 meq via INTRAVENOUS
  Filled 2017-08-31 (×4): qty 100

## 2017-08-31 NOTE — Progress Notes (Signed)
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 It was relieved only when she was given Ativan in the ED.   Subjective: The patient opens her eyes and appears to be looking at me.  She does not follow simple commands.  She can provide no history.  She does not appear to be in respiratory distress nor is there evidence of uncontrolled pain.  There is a rhythmic twitching motion of the left arm and left leg.  Assessment & Plan:  Seizure disorder Care/medications as per Neurology - pt currently on 24hr EEG   Sepsis unspecified organism - ?UTI Fever curve had been trending down, but has deflected upward again a few times over the last 24hrs - WBC has normalized - LP studies not suggestive of meningitis - LP cultures remain negative - Flu screen negative - UA c/w possible UTI -discontinue antibiotics after seventh day of treatment today and follow -if temperature greater than 101 will repeat cultures and UA  Encephalopathy Likely both postictal and septic encephalopathy - LP 10/25 per Neuro unrevealing - ammonia normalized - B12 and folate not low -progress appears to have arrested  Chronic Diastolic CHF No significant volume overload at this time  Essential HTN Blood pressure trending upward - adjust tx and follow   CAD  CKD stage III Renal function stable  Recent Labs Lab 08/27/17 1823 08/28/17 0752 08/29/17 0408 08/30/17 0450 08/31/17 0446  CREATININE 1.16* 1.16* 1.11* 1.17* 1.15*    Hypokalemia Persists - cont to supplement and follow   Hypomagnesemia Due to poor intake - corrected w/ supplementation   RIGHT Hip Fracture Underwent  right total hip arthroplasty 07/29/2017 by Dr. Ninfa Linden.  DM2 w/ hypoglycemia  10/24 A1c 6.5 - CBG trending upward - adjust tx again today and follow   Nutrition  Have been attempting to provide oral nutrition but intake has been essentially nil - will ask for Cortrak to be placed to begin TF and to allow enteral med dosing  MRSA screen +  DVT prophylaxis: SQ heparin Code Status: FULL CODE Family Communication: no family present today   Disposition Plan: SDU  Consultants:  Neurology   Antimicrobials:  Zosyn 10/23 > Vancomycin 10/23 > Ceftriaxone 10/24 > 10/25   Objective: Blood pressure (!) 160/58, pulse (!) 106, temperature (!) 100.6 F (38.1 C), temperature source Axillary, resp. rate 14, height 5\' 4"  (1.626 m), weight 57 kg (125 lb 9.6 oz), SpO2 98 %.  Intake/Output Summary (Last 24 hours) at 08/31/17 1532 Last data filed at 08/31/17 1400  Gross per 24 hour  Intake          2059.17 ml  Output             1290 ml  Net           769.17 ml   Filed Weights   08/29/17 0418 08/30/17 0357 08/31/17 0342  Weight: 59.6 kg (131 lb 6.4 oz) 58.5 kg (129 lb) 57 kg (125 lb 9.6 oz)    Examination: General: No acute respiratory distress - encephalopathic  Lungs: CTA B - no wheezing  Cardiovascular: RRR - no M or rub  Abdomen: NT/ND, soft, bowel sounds positive, no rebound  Extremities: No signif edema B LE   CBC:  Recent Labs Lab 08/26/17 0345  08/27/17 1823 08/28/17 0752 08/29/17 0408 08/30/17 0450 08/31/17 0446  WBC 20.5*  < > 24.6* 23.0* 17.7* 9.7 8.7  NEUTROABS 16.5*  --   --  21.0*  --   --   --   HGB 12.0  < > 8.9* 11.7* 10.1* 9.7* 10.0*  HCT 38.6  < > 28.5* 36.4 32.4* 31.7* 32.5*  MCV 102.7*  < > 102.9* 101.4* 101.9* 101.6* 100.0  PLT 241  < > 122* 151 176 170 170  < > = values in this interval not displayed. Basic Metabolic Panel:  Recent Labs Lab 08/26/17 0857  08/27/17 1823 08/28/17 0752 08/29/17 0408 08/30/17 0450 08/31/17 0446  NA  --   < > 135  141 139 141 140  K  --   < > 2.6* 3.2* 3.5 3.3* 3.3*  CL  --   < > 100* 102 98* 99* 101  CO2  --   < > 23 29 29  32 28  GLUCOSE  --   < > 733* 123* 133* 204* 194*  BUN  --   < > 14 8 7 7  <5*  CREATININE  --   < > 1.16* 1.16* 1.11* 1.17* 1.15*  CALCIUM  --   < > 6.6* 8.6* 8.4* 8.2* 8.2*  MG 1.9  --  1.3* 1.3*  --  1.0* 2.2  PHOS 2.1*  --   --   --   --   --   --   < > = values in this interval not displayed. GFR: Estimated Creatinine Clearance: 35.9 mL/min (A) (by C-G formula based on SCr of 1.15 mg/dL (H)).  Liver Function Tests:  Recent Labs Lab 08/26/17 0345 08/27/17 0400 08/28/17 0752 08/29/17 0408  AST 30 41 29 21  ALT 11* 12* 13* 11*  ALKPHOS 165* 135* 155* 130*  BILITOT 0.7 0.4 0.5 0.7  PROT 7.3 6.6 6.4* 6.0*  ALBUMIN 3.2* 2.6* 2.3* 2.2*    Recent Labs Lab 08/26/17 0857 08/28/17 0752 08/29/17 0408  AMMONIA 24 88* 36*    Coagulation Profile:  Recent Labs Lab 08/26/17 0857  INR 1.11    Cardiac Enzymes:  Recent Labs Lab 08/26/17 0857 08/26/17 1902 08/26/17 2332  CKTOTAL  --  204  --   TROPONINI <0.03 0.03* 0.03*    HbA1C: Hgb A1c MFr Bld  Date/Time Value Ref Range Status  08/26/2017 08:57 AM 6.5 (H) 4.8 - 5.6 % Final    Comment:    (NOTE) Pre diabetes:          5.7%-6.4% Diabetes:              >6.4% Glycemic control for   <7.0% adults with diabetes   06/07/2017 04:19 AM 7.6 (H) 4.8 - 5.6 % Final    Comment:    (NOTE)         Pre-diabetes: 5.7 - 6.4         Diabetes: >6.4         Glycemic control for adults with diabetes: <7.0     CBG:  Recent Labs Lab 08/30/17 1121 08/30/17 1613 08/30/17 2043 08/31/17 0814 08/31/17 1207  GLUCAP 228* 243* 214* 200* 148*    Recent Results (from the past 240 hour(s))  Blood Culture (routine x 2)     Status: None   Collection Time: 08/26/17  3:45 AM  Result Value Ref Range Status   Specimen Description  BLOOD RIGHT ANTECUBITAL  Final   Special Requests   Final    BOTTLES DRAWN AEROBIC AND  ANAEROBIC Blood Culture adequate volume   Culture NO GROWTH 5 DAYS  Final   Report Status 08/31/2017 FINAL  Final  Blood Culture (routine x 2)     Status: None   Collection Time: 08/26/17  4:16 AM  Result Value Ref Range Status   Specimen Description BLOOD LEFT HAND  Final   Special Requests IN PEDIATRIC BOTTLE Blood Culture adequate volume  Final   Culture NO GROWTH 5 DAYS  Final   Report Status 08/31/2017 FINAL  Final  MRSA PCR Screening     Status: Abnormal   Collection Time: 08/27/17  3:47 AM  Result Value Ref Range Status   MRSA by PCR POSITIVE (A) NEGATIVE Final    Comment:        The GeneXpert MRSA Assay (FDA approved for NASAL specimens only), is one component of a comprehensive MRSA colonization surveillance program. It is not intended to diagnose MRSA infection nor to guide or monitor treatment for MRSA infections. RESULT CALLED TO, READ BACK BY AND VERIFIED WITH: R.PIVERJER RN 08/27/17 0724 L.CHAMPION   Culture, Urine     Status: None   Collection Time: 08/27/17 10:47 AM  Result Value Ref Range Status   Specimen Description URINE, CATHETERIZED  Final   Special Requests NONE  Final   Culture NO GROWTH  Final   Report Status 08/28/2017 FINAL  Final  CSF culture with Stat gram stain     Status: None   Collection Time: 08/27/17  3:44 PM  Result Value Ref Range Status   Specimen Description CSF  Final   Special Requests Normal  Final   Gram Stain   Final    WBC PRESENT, PREDOMINANTLY MONONUCLEAR NO ORGANISMS SEEN CYTOSPIN SMEAR    Culture NO GROWTH 3 DAYS  Final   Report Status 08/30/2017 FINAL  Final     Scheduled Meds: . aspirin  325 mg Oral Daily  . atorvastatin  10 mg Oral Daily  . Chlorhexidine Gluconate Cloth  6 each Topical Q0600  . feeding supplement (ENSURE ENLIVE)  237 mL Oral BID BM  . heparin  5,000 Units Subcutaneous Q8H  . insulin aspart  0-5 Units Subcutaneous QHS  . insulin aspart  0-9 Units Subcutaneous TID WC  . mupirocin ointment  1  application Nasal BID  . pantoprazole  40 mg Oral Q1200     LOS: 5 days   Cherene Altes, MD [facility] Office  [phone] Pager - Text Page per Shea Evans as per below:  On-Call/Text Page:      [email or web address]      password [email or web address]  If 7PM-7AM, please contact night-coverage [email or web address] Password [email or web address] 08/31/2017, 3:32 PM

## 2017-08-31 NOTE — Plan of Care (Signed)
Problem: Safety: Goal: Ability to remain free from injury will improve Outcome: Progressing Bed alarm on, call light within reach, fall bundle utilized.

## 2017-08-31 NOTE — Progress Notes (Signed)
LTM day 1 started; Dr Doree Albee notified.

## 2017-08-31 NOTE — Clinical Social Work Note (Signed)
Clinical Social Work Assessment  Patient Details  Name: [name] [name] MRN: [id number] Date of Birth: 04-02-1941  Date of referral:  08/31/17               Reason for consult:  Facility Placement                Permission sought to share information with:  Facility Sport and exercise psychologist, Family Supports Permission granted to share information::  No (patient disoriented)  Name::     [name] [name]  Agency::  SNFs  Relationship::  spouse  Contact Information:  [phone]  Housing/Transportation Living arrangements for the past 2 months:  [place], [place] of Information:  Spouse Patient Interpreter Needed:  None Criminal Activity/Legal Involvement Pertinent to Current Situation/Hospitalization:  No - Comment as needed Significant Relationships:  Spouse Lives with:  Spouse Do you feel safe going back to the place where you live?  Yes Need for family participation in patient care:  Yes (Comment)  Care giving concerns: Patient from home with spouse. Patient disoriented. PT recommends SNF.   Social Worker assessment / plan:  CSW spoke to patient's husband, [name] [name], via phone. Husband agreeable to SNF but indicated patient has used all of her Medicare coverage days at recent SNF stays. Patient had to private pay at last SNF stay. Husband agreeable for SNF referrals again and agreeable to private pay for SNF stay for approximately a week again. Prefers facilities near [facility] or in [place], or would go to [place] again. Husband also prepared to take patient home if no bed offers. Husband provides 24/7 care for patient.  Employment status:  Retired Forensic scientist:  [employer] PT Recommendations:  [place] / Referral to community resources:  [place]  Patient/Family's Response to care: Husband appreciative of care.  Patient/Family's Understanding of and Emotional Response to Diagnosis, Current  Treatment, and Prognosis: Husband with good understanding of patient's condition and has been caring for patient since discharge from SNF. Agreeable to SNF again and can private pay for short term.   Emotional Assessment Appearance:  Appears stated age Attitude/Demeanor/Rapport:  Unable to Assess Affect (typically observed):  Unable to Assess Orientation:  Oriented to Self Alcohol / Substance use:  Not Applicable Psych involvement (Current and /or in the community):  No (Comment)  Discharge Needs  Concerns to be addressed:  Discharge Planning Concerns Readmission within the last 30 days:  Yes Current discharge risk:  Physical Impairment, Cognitively Impaired, Dependent with Mobility Barriers to Discharge:  Continued Medical Work up   [name] Emms, LCSW 08/31/2017, 4:33 PM

## 2017-08-31 NOTE — Progress Notes (Signed)
Neurology Progress Note   S:// Seen and examined. Continues to be hepatic. LTM EEG started this AM.  O:// Current vital signs: BP (!) 125/59 (BP Location: Left Arm)   Pulse (!) 111   Temp (!) 100.4 F (38 C) (Rectal)   Resp 15   Ht 5\' 4"  (1.626 m)   Wt 57 kg (125 lb 9.6 oz)   SpO2 98%   BMI 21.56 kg/m  Vital signs in last 24 hours: Temp:  [98.4 F (36.9 C)-100.4 F (38 C)] 100.4 F (38 C) (10/29 7341) Pulse Rate:  [100-115] 111 (10/29 0815) Resp:  [15-21] 15 (10/29 0815) BP: (100-147)/(51-87) 125/59 (10/29 0815) SpO2:  [94 %-99 %] 98 % (10/29 0815) Weight:  [57 kg (125 lb 9.6 oz)] 57 kg (125 lb 9.6 oz) (10/29 0342) General: Well-developed well-nourished, appears anxious and distressed HEENT: Normocephalic, normal line conjunctivo-, normal auditory canals, clear nares, clear throat Cardiovascular-S1-S2 heard regular rate rhythm Lungs clear to auscultation Abdomen: Normal bowel sounds Neurological exam Mental status: Does not follow commands.  Some verbal output but incoherent Cranial nerves: Pupils equal round reactive, extraocular movements intact, facial sensation intact, face symmetric, hard of hearing in both ears, tongue midline Motor exam: Spontaneously moves all 4 extremities, normal bulk Sensory exam: Withdraws briskly all 4 to pain Does not perform finger-nose-finger testing. Gait not tested for patient safety  Medications  Current Facility-Administered Medications:  .  acetaminophen (TYLENOL) tablet 650 mg, 650 mg, Oral, Q6H PRN **OR** acetaminophen (TYLENOL) suppository 650 mg, 650 mg, Rectal, Q6H PRN, Cherene Altes, MD, 650 mg at 08/30/17 1323 .  aspirin tablet 325 mg, 325 mg, Oral, Daily, Cherene Altes, MD, Stopped at 08/31/17 1020 .  atorvastatin (LIPITOR) tablet 10 mg, 10 mg, Oral, Daily, Waldemar Dickens, MD, Stopped at 08/31/17 1019 .  Chlorhexidine Gluconate Cloth 2 % PADS 6 each, 6 each, Topical, Q0600, Allie Bossier, MD, 6 each at  08/31/17 [phone] .  dextrose 5 %-0.9 % sodium chloride infusion, , Intravenous, Continuous, Cherene Altes, MD, Last Rate: 50 mL/hr at 08/31/17 0616 .  feeding supplement (ENSURE ENLIVE) (ENSURE ENLIVE) liquid 237 mL, 237 mL, Oral, BID BM, Cherene Altes, MD, 237 mL at 08/29/17 1153 .  heparin injection 5,000 Units, 5,000 Units, Subcutaneous, Q8H, Marliss Coots, PA-C, 5,000 Units at 08/31/17 0550 .  ibuprofen (ADVIL,MOTRIN) tablet 400 mg, 400 mg, Oral, Q6H PRN, Joette Catching T, MD .  insulin aspart (novoLOG) injection 0-5 Units, 0-5 Units, Subcutaneous, QHS, Cherene Altes, MD, 2 Units at 08/30/17 2154 .  insulin aspart (novoLOG) injection 0-9 Units, 0-9 Units, Subcutaneous, TID WC, Cherene Altes, MD, 2 Units at 08/31/17 [phone] .  LORazepam (ATIVAN) injection 1-2 mg, 1-2 mg, Intravenous, Q2H PRN, Waldemar Dickens, MD, 1 mg at 08/26/17 1541 .  metoprolol tartrate (LOPRESSOR) injection 5 mg, 5 mg, Intravenous, Q6H PRN, Waldemar Dickens, MD, 5 mg at 08/26/17 1809 .  mupirocin ointment (BACTROBAN) 2 % 1 application, 1 application, Nasal, BID, Allie Bossier, MD, 1 application at 97/35/32 [phone] .  ondansetron (ZOFRAN) tablet 4 mg, 4 mg, Oral, Q6H PRN **OR** ondansetron (ZOFRAN) injection 4 mg, 4 mg, Intravenous, Q6H PRN, Waldemar Dickens, MD .  pantoprazole (PROTONIX) EC tablet 40 mg, 40 mg, Oral, Q1200, Cherene Altes, MD .  piperacillin-tazobactam (ZOSYN) IVPB 3.375 g, 3.375 g, Intravenous, Q8H, Kris Mouton, RPH, Last Rate: 12.5 mL/hr at 08/31/17 1149, 3.375 g at 08/31/17 1149 .  valproate (DEPACON) 750  mg in dextrose 5 % 50 mL IVPB, 750 mg, Intravenous, Q12H, Greta Doom, MD, Stopped at 08/31/17 1120 .  vancomycin (VANCOCIN) IVPB 1000 mg/200 mL premix, 1,000 mg, Intravenous, Q24H, Kris Mouton, RPH, Stopped at 08/30/17 2253 Labs CBC    Component Value Date/Time   WBC 8.7 08/31/2017 0446   RBC 3.25 (L) 08/31/2017 0446   HGB 10.0 (L) 08/31/2017 0446   HCT 32.5  (L) 08/31/2017 0446   PLT 170 08/31/2017 0446   MCV 100.0 08/31/2017 0446   MCV 99.9 (A) 08/16/2012 1615   MCH 30.8 08/31/2017 0446   MCHC 30.8 08/31/2017 0446   RDW 15.0 08/31/2017 0446   LYMPHSABS 0.9 08/28/2017 0752   MONOABS 0.9 08/28/2017 0752   EOSABS 0.2 08/28/2017 0752   BASOSABS 0.0 08/28/2017 0752    CMP     Component Value Date/Time   NA 140 08/31/2017 0446   K 3.3 (L) 08/31/2017 0446   CL 101 08/31/2017 0446   CO2 28 08/31/2017 0446   GLUCOSE 194 (H) 08/31/2017 0446   BUN <5 (L) 08/31/2017 0446   CREATININE 1.15 (H) 08/31/2017 0446   CREATININE 1.34 (H) 08/16/2012 1614   CALCIUM 8.2 (L) 08/31/2017 0446   PROT 6.0 (L) 08/29/2017 0408   ALBUMIN 2.2 (L) 08/29/2017 0408   AST 21 08/29/2017 0408   ALT 11 (L) 08/29/2017 0408   ALKPHOS 130 (H) 08/29/2017 0408   BILITOT 0.7 08/29/2017 0408   GFRNONAA 45 (L) 08/31/2017 0446   GFRAA 52 (L) 08/31/2017 0446   Valproate level on 08/26/2017-71  Imaging I have reviewed images in epic and the results pertinent to this consultation are: Noncontrast CT of the head done on 08/26/2017 shows no acute intracranial abnormality.  Assessment:  76 year old woman who has a past medical history of right parieto-occipital hemorrhage, seizures, chronic kidney disease stage III, insulin-dependent diabetes, hypertension and recent hip surgery with breakthrough seizures in the setting of UTI Her mental status reportedly improved initially on treatment of the UTI but since then has had waxing and waning mental status. For me today, she did not follow any commands whereas yesterday for my colleague who examined her, she was able to follow commands and able to tell her name. Toxic metabolic encephalopathy in the setting of UTI.  Rule out seizures  Recommendations: Long-term EEG monitoring has been started today. Continue with Depakote 750 twice daily Continue to treat her underlying infections We will follow with you once the LTM EEG  results are available   Amie Portland, MD [facility] [phone] If 7pm to 7am, please call on call as listed on AMION.

## 2017-08-31 NOTE — NC FL2 (Signed)
[place] LEVEL OF CARE SCREENING TOOL     IDENTIFICATION  Patient Name: [name] [name] Birthdate: 06-23-1941 Sex: female Admission Date (Current Location): 08/26/2017  [facility] [place] Number:  Herbalist and Address:  [facility] [place], [place] [address], [place], [place] [address]      Provider Number: [id number]  Attending Physician Name and Address:  Cherene Altes, MD  Relative Name and Phone Number:  Takeesha Isley, spouse, [phone]    Current Level of Care: Hospital Recommended Level of Care: [place] Prior Approval Number:    Date Approved/Denied:   PASRR Number: [id number]  Discharge Plan: SNF    Current Diagnoses: Patient Active Problem List   Diagnosis Date Noted  . Status epilepticus ([place]) 08/26/2017  . S/P right hip fracture 08/26/2017  . Status post total hip replacement, right 08/10/2017  . Closed right hip fracture, initial encounter ([place]) 07/27/2017  . Hip fracture ([place]) 07/27/2017  . Pressure injury of skin 06/14/2017  . Encounter for palliative care   . Goals of care, counseling/discussion   . CAP (community acquired pneumonia) 06/12/2017  . Sepsis ([place]) 06/11/2017  . Weakness 06/11/2017  . Anemia 06/11/2017  . AKI (acute kidney injury) ([place]) 06/05/2017  . Sepsis, unspecified organism ([place]) 06/05/2017  . Partial symptomatic epilepsy with complex partial seizures, not intractable, without status epilepticus ([place])   . Bacteremia due to Escherichia coli 11/18/2016  . Renal insufficiency   . Aortic atherosclerosis ([place]) 11/15/2016  . UTI (urinary tract infection) 11/15/2016  . Paranoia ([place]) 05/24/2015  . Tachycardia 07/02/2013  . Hypophosphatemia 07/02/2013  . Encounter for central line placement 07/02/2013  . Hypokalemia 07/01/2013  . Hypomagnesemia 07/01/2013  . Protein-calorie malnutrition, severe ([place]) 06/29/2013  . Leukocytosis, unspecified 06/29/2013  . Polyp of vocal cord  12/13/2012  . TIA (transient ischemic attack) 12/13/2012  . 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 Syncope 09/08/2012  . Acute respiratory failure with hypoxia ([place]) 08/20/2012  . Seizure disorder, grand mal ([place]) 08/20/2012  . Seizure ([place]) 08/20/2012  . CKD (chronic kidney disease) stage 3, GFR 30-59 ml/min (HCC) 08/19/2012  . Leukocytosis 08/19/2012  . Hemorrhage in the brain-13 x 22 x 14 mm right posterior temporo-occipital intra-axial acute 08/19/2012  . Left hip pain 08/19/2012  . Left hemiparesis ([place]) 08/19/2012  . Hemianopia, homonymous, left 08/19/2012  . Hemisensory deficit, left 08/19/2012  . Nausea 08/19/2012  . Headache(784.0) 08/19/2012  . Acute encephalopathy 08/19/2012  . Hemorrhage of brain, nontraumatic ([place]) 08/18/2012  . Dysphagia 08/18/2012  . HTN (hypertension) 08/18/2012  . DM type 2 (diabetes mellitus, type 2) ([place]) 07/26/2012  . Hypoglycemia due to insulin 07/26/2012  . GERD (gastroesophageal reflux disease)     Orientation RESPIRATION BLADDER Height & Weight     Self  Normal Incontinent Weight: 125 lb 9.6 oz (57 kg) Height:  5\' 4"  (162.6 cm)  BEHAVIORAL SYMPTOMS/MOOD NEUROLOGICAL BOWEL NUTRITION STATUS      Incontinent Diet (please see DC summary)  AMBULATORY STATUS COMMUNICATION OF NEEDS Skin   Extensive Assist Verbally Normal                       Personal Care Assistance Level of Assistance  Bathing, Feeding, Dressing Bathing Assistance: Maximum assistance Feeding assistance: Limited assistance Dressing Assistance: Maximum assistance     Functional Limitations Info  Sight, Hearing, Speech Sight Info: Adequate Hearing Info: Adequate Speech Info: Impaired    SPECIAL  CARE FACTORS FREQUENCY  PT (By licensed PT)     PT Frequency: 5x/week              Contractures Contractures Info: Not present    Additional Factors Info  Code Status, Allergies, Insulin Sliding Scale Code Status Info:  Full Allergies Info: Barbiturates, Latex   Insulin Sliding Scale Info: insulin novolog 3x/day and at bedtime, insulin levemir at bedtime       Current Medications (08/31/2017):  This is the current hospital active medication list Current Facility-Administered Medications  Medication Dose Route Frequency Provider Last Rate Last Dose  . acetaminophen (TYLENOL) tablet 650 mg  650 mg Oral Q6H PRN Cherene Altes, MD       Or  . acetaminophen (TYLENOL) suppository 650 mg  650 mg Rectal Q6H PRN Cherene Altes, MD   650 mg at 08/31/17 1208  . aspirin tablet 325 mg  325 mg Oral Daily Cherene Altes, MD   Stopped at 08/31/17 1020  . atorvastatin (LIPITOR) tablet 10 mg  10 mg Oral Daily Waldemar Dickens, MD   Stopped at 08/31/17 1019  . dextrose 5 %-0.9 % sodium chloride infusion   Intravenous Continuous Cherene Altes, MD 50 mL/hr at 08/31/17 [phone]    . feeding supplement (ENSURE ENLIVE) (ENSURE ENLIVE) liquid 237 mL  237 mL Oral BID BM Cherene Altes, MD   237 mL at 08/29/17 1153  . heparin injection 5,000 Units  5,000 Units Subcutaneous Q8H Marliss Coots, PA-C   5,000 Units at 08/31/17 1440  . ibuprofen (ADVIL,MOTRIN) tablet 400 mg  400 mg Oral Q6H PRN Cherene Altes, MD      . insulin aspart (novoLOG) injection 0-5 Units  0-5 Units Subcutaneous QHS Cherene Altes, MD   2 Units at 08/30/17 2154  . insulin aspart (novoLOG) injection 0-9 Units  0-9 Units Subcutaneous TID WC Cherene Altes, MD   1 Units at 08/31/17 1223  . insulin detemir (LEVEMIR) injection 8 Units  8 Units Subcutaneous QHS Joette Catching T, MD      . LORazepam (ATIVAN) injection 1-2 mg  1-2 mg Intravenous Q2H PRN Waldemar Dickens, MD   1 mg at 08/26/17 1541  . metoprolol tartrate (LOPRESSOR) injection 5 mg  5 mg Intravenous Q6H Cherene Altes, MD      . mupirocin ointment (BACTROBAN) 2 % 1 application  1 application Nasal BID Allie Bossier, MD   1 application at 53/66/44 [phone]  . ondansetron  (ZOFRAN) tablet 4 mg  4 mg Oral Q6H PRN Waldemar Dickens, MD       Or  . ondansetron [facility]) injection 4 mg  4 mg Intravenous Q6H PRN Waldemar Dickens, MD      . pantoprazole (PROTONIX) EC tablet 40 mg  40 mg Oral Q1200 Joette Catching T, MD      . piperacillin-tazobactam (ZOSYN) IVPB 3.375 g  3.375 g Intravenous Q8H Cherene Altes, MD   Stopped at 08/31/17 1549  . potassium chloride 10 mEq in 100 mL IVPB  10 mEq Intravenous Q1 Hr x 4 Joette Catching T, MD 100 mL/hr at 08/31/17 1606 10 mEq at 08/31/17 1606  . valproate (DEPACON) 750 mg in dextrose 5 % 50 mL IVPB  750 mg Intravenous Q12H Greta Doom, MD   Stopped at 08/31/17 1120  . vancomycin (VANCOCIN) IVPB 1000 mg/200 mL premix  1,000 mg Intravenous Q24H Cherene Altes, MD   Stopped at 08/30/17  2253     Discharge Medications: Please see discharge summary for a list of discharge medications.  Relevant Imaging Results:  Relevant Lab Results:   Additional Information SSN: [id number]  Estanislado Emms, LCSW

## 2017-08-31 NOTE — Progress Notes (Signed)
Physical Therapy Treatment Patient Details Name: [name] [name] MRN: [id number] DOB: 07-13-1941 Today's Date: 08/31/2017    History of Present Illness Patient is a 76 y/o female who presents with witnessed seizure and now post ictal. Pt on continuous EEG. PMH includes anemia, CKD, depression, GERD, HLD, HTN, SZR, DM, Hip fracture.    PT Comments    Patient more alert today, with eyes open. Able to follow simple 1 step commands ~20% of the time with increased time and multimodal cues. Able to initiate movement during rolling. Minimal verbalizations today, able to state name. Easily distracted. Decreased attention and awareness. Pt on continuous EEG so deferred sitting EOB today. Will progress to EOB activities next session.    Follow Up Recommendations  SNF;Supervision for mobility/OOB;Supervision/Assistance - 24 hour     Equipment Recommendations  Other (comment) (hospital bed if she goes home)    Recommendations for Other Services       Precautions / Restrictions Precautions Precautions: Fall Precaution Comments: watch HR Restrictions Weight Bearing Restrictions: No    Mobility  Bed Mobility Overal bed mobility: Needs Assistance Bed Mobility: Rolling Rolling: Max assist;+2 for physical assistance         General bed mobility comments: Rolled to right/left with cues, able to initiate flexing knees and reaching with UEs for rail but required hand over hand placement to reach for rail.   Transfers                 General transfer comment: Deferred.  Ambulation/Gait                 Stairs            Wheelchair Mobility    Modified Rankin (Stroke Patients Only)       Balance Overall balance assessment: No apparent balance deficits (not formally assessed)                                          Cognition Arousal/Alertness: Awake/alert Behavior During Therapy: Flat affect Overall Cognitive Status: Impaired/Different from  baseline Area of Impairment: Orientation;Attention;Following commands;Awareness;Problem solving                 Orientation Level: Disoriented to;Place;Time;Situation Current Attention Level: Focused   Following Commands: Follows one step commands inconsistently (with increased time and multimodal cues)   Awareness: Intellectual Problem Solving: Slow processing;Decreased initiation;Difficulty sequencing;Requires verbal cues;Requires tactile cues General Comments: Follows 1 step commands inconsistently ~<20% of the time. ABle to initiate movements with increased time and cues. Focused attention and responds to name nbut minimal verbalizations. ABle to state name.       Exercises General Exercises - Lower Extremity Ankle Circles/Pumps: AAROM;Both;5 reps Heel Slides: AAROM;Both    General Comments General comments (skin integrity, edema, etc.): HR ranged from 113-121 bpm during session in supine.       Pertinent Vitals/Pain Pain Assessment: Faces Faces Pain Scale: Hurts little more Pain Location: grimacing at rest Pain Descriptors / Indicators: Grimacing Pain Intervention(s): Monitored during session;Repositioned    Home Living                      Prior Function            PT Goals (current goals can now be found in the care plan section) Progress towards PT goals: Progressing toward goals (slowly)    Frequency  Min 2X/week      PT Plan Current plan remains appropriate    Co-evaluation              AM-PAC PT "6 Clicks" Daily Activity  Outcome Measure  Difficulty turning over in bed (including adjusting bedclothes, sheets and blankets)?: Unable Difficulty moving from lying on back to sitting on the side of the bed? : Unable Difficulty sitting down on and standing up from a chair with arms (e.g., wheelchair, bedside commode, etc,.)?: Unable Help needed moving to and from a bed to chair (including a wheelchair)?: Total Help needed walking in  hospital room?: Total Help needed climbing 3-5 steps with a railing? : Total 6 Click Score: 6    End of Session Equipment Utilized During Treatment:  (continuous EEG) Activity Tolerance: Patient limited by lethargy (arousal; confusion) Patient left: in bed;with call bell/phone within reach;with bed alarm set Nurse Communication: Mobility status;Need for lift equipment PT Visit Diagnosis: Muscle weakness (generalized) (M62.81);Other symptoms and signs involving the nervous system (Z61.096)     Time: 0454-0981 PT Time Calculation (min) (ACUTE ONLY): 15 min  Charges:  $Therapeutic Activity: 8-22 mins                    G Codes:       Wray Kearns, PT, DPT [phone]     Marguarite Arbour A Kristell Wooding 08/31/2017, 2:42 PM

## 2017-08-31 NOTE — Progress Notes (Signed)
Inpatient Diabetes Program Recommendations  AACE/ADA: New Consensus Statement on Inpatient Glycemic Control (2015)  Target Ranges:  Prepandial:   less than 140 mg/dL      Peak postprandial:   less than 180 mg/dL (1-2 hours)      Critically ill patients:  140 - 180 mg/dL   Lab Results  Component Value Date   GLUCAP 148 (H) 08/31/2017   HGBA1C 6.5 (H) 08/26/2017    Review of Glycemic ControlResults for PHUONG, HILLARY (MRN [id number]) as of 08/31/2017 14:56  Ref. Range 08/30/2017 07:32 08/30/2017 11:21 08/30/2017 16:13 08/30/2017 20:43 08/31/2017 08:14 08/31/2017 12:07  Glucose-Capillary Latest Ref Range: 65 - 99 mg/dL 224 (H) 228 (H) 243 (H) 214 (H) 200 (H) 148 (H)   Diabetes history: Type 2 DM Outpatient Diabetes medications: Levemir 8 units daily, Novolog 4-9 units tid with meals Current orders for Inpatient glycemic control:  Novolog sensitive tid with meals and HS  Inpatient Diabetes Program Recommendations:   Please consider adding Levemir 8 units daily.   Thanks, Adah Perl, RN, BC-ADM Inpatient Diabetes Coordinator Pager [phone] (8a-5p)

## 2017-09-01 DIAGNOSIS — G40901 Epilepsy, unspecified, not intractable, with status epilepticus: Secondary | ICD-10-CM

## 2017-09-01 LAB — GLUCOSE, CAPILLARY
GLUCOSE-CAPILLARY: 49 mg/dL — AB (ref 65–99)
Glucose-Capillary: 206 mg/dL — ABNORMAL HIGH (ref 65–99)
Glucose-Capillary: 137 mg/dL — ABNORMAL HIGH (ref 65–99)
Glucose-Capillary: 109 mg/dL — ABNORMAL HIGH (ref 65–99)
Glucose-Capillary: 179 mg/dL — ABNORMAL HIGH (ref 65–99)

## 2017-09-01 LAB — BASIC METABOLIC PANEL
ANION GAP: 9 (ref 5–15)
BUN: <5 mg/dL — ABNORMAL LOW (ref 6–20)
CHLORIDE: 103 mmol/L (ref 101–111)
CO2: 32 mmol/L (ref 22–32)
Calcium: 8.6 mg/dL — ABNORMAL LOW (ref 8.9–10.3)
Creatinine, Ser: 1.01 mg/dL — ABNORMAL HIGH (ref 0.44–1.00)
GFR calc non Af Amer: 53 mL/min — ABNORMAL LOW (ref >60)
Glucose, Bld: 48 mg/dL — ABNORMAL LOW (ref 65–99)
Potassium: 2.6 mmol/L — CL (ref 3.5–5.1)
SODIUM: 144 mmol/L (ref 135–145)

## 2017-09-01 LAB — CBC
HCT: 32.9 % — ABNORMAL LOW (ref 36.0–46.0)
HEMOGLOBIN: 10.3 g/dL — AB (ref 12.0–15.0)
MCH: 31.4 pg (ref 26.0–34.0)
MCHC: 31.3 g/dL (ref 30.0–36.0)
MCV: 100.3 fL — AB (ref 78.0–100.0)
Platelets: 228 10*3/uL (ref 150–400)
RBC: 3.28 MIL/uL — AB (ref 3.87–5.11)
RDW: 14.7 % (ref 11.5–15.5)
WBC: 11.5 10*3/uL — AB (ref 4.0–10.5)

## 2017-09-01 LAB — VALPROIC ACID LEVEL: VALPROIC ACID LVL: 84 ug/mL (ref 50.0–100.0)

## 2017-09-01 LAB — AMMONIA: Ammonia: 60 umol/L — ABNORMAL HIGH (ref 9–35)

## 2017-09-01 MED ORDER — LEVETIRACETAM 500 MG/5ML IV SOLN
1000.0000 mg | INTRAVENOUS | Status: AC
  Administered 2017-09-01: 1000 mg via INTRAVENOUS
  Filled 2017-09-01: qty 10

## 2017-09-01 MED ORDER — POTASSIUM CHLORIDE 10 MEQ/100ML IV SOLN
10.0000 meq | INTRAVENOUS | Status: AC
  Administered 2017-09-01 (×4): 10 meq via INTRAVENOUS
  Filled 2017-09-01 (×4): qty 100

## 2017-09-01 MED ORDER — KCL IN DEXTROSE-NACL 40-5-0.9 MEQ/L-%-% IV SOLN
INTRAVENOUS | Status: DC
  Administered 2017-09-01 – 2017-09-03 (×3): via INTRAVENOUS
  Filled 2017-09-01 (×6): qty 1000

## 2017-09-01 MED ORDER — DEXTROSE 50 % IV SOLN
INTRAVENOUS | Status: AC
  Administered 2017-09-01: 50 mL
  Filled 2017-09-01: qty 50

## 2017-09-01 MED ORDER — LACTULOSE 10 GM/15ML PO SOLN
10.0000 g | Freq: Three times a day (TID) | ORAL | Status: DC
  Administered 2017-09-01 – 2017-09-04 (×8): 10 g via ORAL
  Filled 2017-09-01 (×9): qty 15

## 2017-09-01 MED ORDER — SODIUM CHLORIDE 0.9 % IV SOLN
500.0000 mg | Freq: Two times a day (BID) | INTRAVENOUS | Status: DC
  Administered 2017-09-01 – 2017-09-02 (×2): 500 mg via INTRAVENOUS
  Filled 2017-09-01 (×3): qty 5

## 2017-09-01 NOTE — Evaluation (Signed)
Speech Language Pathology Evaluation Patient Details Name: [name] [name] MRN: [id number] DOB: [birth year] Today's Date: 09/01/2017 Time: 6761-9509 SLP Time Calculation (min) (ACUTE ONLY): 8 min  Problem List:  Patient Active Problem List   Diagnosis Date Noted  . Status epilepticus ([place]) 08/26/2017  . S/P right hip fracture 08/26/2017  . Status post total hip replacement, right 08/10/2017  . Closed right hip fracture, initial encounter ([place]) 07/27/2017  . Hip fracture ([place]) 07/27/2017  . Pressure injury of skin 06/14/2017  . Encounter for palliative care   . Goals of care, counseling/discussion   . CAP (community acquired pneumonia) 06/12/2017  . Sepsis ([place]) 06/11/2017  . Weakness 06/11/2017  . Anemia 06/11/2017  . AKI (acute kidney injury) ([place]) 06/05/2017  . Sepsis, unspecified organism ([place]) 06/05/2017  . Partial symptomatic epilepsy with complex partial seizures, not intractable, without status epilepticus ([place])   . Bacteremia due to Escherichia coli 11/18/2016  . Renal insufficiency   . Aortic atherosclerosis ([place]) 11/15/2016  . UTI (urinary tract infection) 11/15/2016  . Paranoia ([place]) 05/24/2015  . Tachycardia 07/02/2013  . Hypophosphatemia 07/02/2013  . Encounter for central line placement 07/02/2013  . Hypokalemia 07/01/2013  . Hypomagnesemia 07/01/2013  . Protein-calorie malnutrition, severe ([place]) 06/29/2013  . Leukocytosis, unspecified 06/29/2013  . 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 TIA (transient ischemic attack) 12/13/2012  . 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 Syncope 09/08/2012  . Acute respiratory failure with hypoxia ([place]) 08/20/2012  . Seizure disorder, grand mal ([place]) 08/20/2012  . Seizure ([place]) 08/20/2012  . CKD (chronic kidney disease) stage 3, GFR 30-59 ml/min (HCC) 08/19/2012  . Leukocytosis 08/19/2012  . Hemorrhage in the brain-13 x 22 x 14 mm right posterior temporo-occipital intra-axial acute 08/19/2012  . Left hip  pain 08/19/2012  . Left hemiparesis ([place]) 08/19/2012  . Hemianopia, homonymous, left 08/19/2012  . Hemisensory deficit, left 08/19/2012  . Nausea 08/19/2012  . Headache(784.0) 08/19/2012  . Acute encephalopathy 08/19/2012  . Hemorrhage of brain, nontraumatic ([place]) 08/18/2012  . Dysphagia 08/18/2012  . HTN (hypertension) 08/18/2012  . DM type 2 (diabetes mellitus, type 2) ([place]) 07/26/2012  . Hypoglycemia due to insulin 07/26/2012  . GERD (gastroesophageal reflux disease)    Past Medical History:  Past Medical History:  Diagnosis Date  . Anemia   . Arthritis    "a touch in my hands" (11/17/2016)  . Chronic back pain   . Chronic kidney disease (CKD), active medical management without dialysis, stage 3 (moderate) ([place])   . Depression   . GERD (gastroesophageal reflux disease)   . High cholesterol   . Hyperlipidemia   . Hypertension   . Seizures ([place])    last seizure 2014 (11/17/2016)  . Stroke [facility]) 2013   Secondary to acute right posterior temporo-occipital intra-axial hemorrhage  . Type II diabetes mellitus ([place])    Past Surgical History:  Past Surgical History:  Procedure Laterality Date  . CATARACT EXTRACTION W/ INTRAOCULAR LENS IMPLANT Bilateral   . COLONOSCOPY    . DILATION AND CURETTAGE OF UTERUS  X 3  . 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 TOTAL HIP ARTHROPLASTY Right 07/28/2017   Procedure: TOTAL HIP ARTHROPLASTY ANTERIOR APPROACH;  Surgeon: Mcarthur Rossetti, MD;  Location: [place];  Service: Orthopedics;  Laterality: Right;   HPI:  62yoF w/ a Hx CVA (right posterior  temporo-occipital intra-axial hemorrhage), Seizures, Depression, Anemia, Chronic back pain, CKD stage III, HLD, HTN, DM2, GERD, and Hip fracture who suffered a seizure on the day of admission, lasting 30-45 minutes. Pt was also septic, likely due to UTI. Pt  has had multiple swallowing evaluations in the past, although most recent recommendations have been for regular diet textures and thin liquids.    Assessment / Plan / Recommendation Clinical Impression  Pt is very lethargic with inability to sustain arousal, although she will stir with maximal stimulation. She is not following commands or attempting to communicate, but she has some spontaneous, purposeful movements, like wiping her face or pursing her lips to oral stimulation. Her alertness appears to wax/wane throughout the day, although per husband she is still not interactive or communicating when alert. SLP will continue to follow to facilitate communication/mentation during acute stay, although with suspected infectious source versus seizures on top of baseline cognitive impairment, therapeutic intervention may be limited.    SLP Assessment  SLP Recommendation/Assessment: Patient needs continued Speech Lanaguage Pathology Services SLP Visit Diagnosis: Cognitive communication deficit (R41.841)    Follow Up Recommendations  Skilled Nursing facility    Frequency and Duration min 2x/week  2 weeks      SLP Evaluation Cognition  Overall Cognitive Status: Impaired/Different from baseline Arousal/Alertness: Lethargic Orientation Level: Oriented to person Attention: Focused Focused Attention: Impaired Focused Attention Impairment: Verbal basic;Functional basic Awareness: Impaired Awareness Impairment: Emergent impairment       Comprehension  Auditory Comprehension Overall Auditory Comprehension: Impaired Commands: Impaired One Step Basic Commands: 0-24% accurate    Expression Expression Primary Mode of Expression: Verbal Verbal Expression Overall Verbal Expression: Impaired Initiation: Impaired   Oral / Motor  Oral Motor/Sensory Function Overall Oral Motor/Sensory Function:  (difficult to assess) Motor Speech Overall Motor Speech: Other (comment) (UTA)   GO                     [name] 09/01/2017, 3:58 PM

## 2017-09-01 NOTE — Progress Notes (Signed)
[facility] TEAM 1 - Stepdown/ICU TEAM  [name] [name]  QQP:[id number] DOB: [birth year] DOA: 08/26/2017 PCP: [name], MD    Brief Narrative:  25yoF w/ a Hx CVA (right posterior temporo-occipital intra-axial hemorrhage), Seizures, Depression, Anemia, Chronic back pain, CKD stage III, HLD, HTN, DM2, and Hip fracture who suffered a seizure on the day of admission, lasting 30-45 minutes. It was relieved only when she was given Ativan in the ED.   Subjective: Husband refused placement of a Cortrak NG feeding tube for now and wants to wait another day or two to see if she will wake up to eat.  Her mental status reportedly continues to wax and wane, though at the time of my visit she is very lethargic, barely opening her eyes to voice.    Assessment & Plan:  Seizure disorder Care/medications as per Neurology - pt currently on continuous EEG - seizure meds being titrated   Sepsis unspecified organism - ?UTI Fever curve has been fluctuating - WBC has been fluctuationg - LP studies not suggestive of meningitis - LP cultures negative - Flu screen negative - UA c/w possible UTI - discontinued antibiotics after seventh day of treatment - if temperature greater than 101 will repeat cultures and UA  Encephalopathy Previously felt to be both postictal and septic encephalopathy - LP 10/25 per Neuro unrevealing - ammonia fluctuation, but in setting of fluctuating mental status will proceed w/ tx - B12 and folate not low -progress appears to have arrested - ?if this represents anoxic injury due to initial prolonged seizure at time of presentation - if pt does not improve signif over the next 24-48hrs will consider consulting Palliative Care   Chronic Diastolic CHF No significant volume overload at this time  Essential HTN Blood pressure controlled   CAD  CKD stage III Renal function stable  Recent Labs Lab 08/28/17 0752 08/29/17 0408 08/30/17 0450 08/31/17 0446 09/01/17 0523  CREATININE  1.16* 1.11* 1.17* 1.15* 1.01*    Hypokalemia Persists - due to lack of nutrition - cont to supplement and follow   Hypomagnesemia Due to poor intake - corrected w/ supplementation   RIGHT Hip Fracture Underwent right total hip arthroplasty 07/29/2017 by Dr. Ninfa Linden.  DM2 w/ hypoglycemia  10/24 A1c 6.5 - suffered and episode of hypoglycemia this morning - adjust tx and follow - wish to avoid hypoglycemia, and will tolerate CBG to 250  Severe malnutrition in context of chronic illness Have been attempting to provide oral nutrition but intake has been essentially nil - asked for Cortrak to be placed to begin TF and to allow enteral med dosing but husband is presently refusing - explained to him dire need for nutrition - he agrees to placement of cortrak if she does not improve to the point of consistent intake over the next 24-48hrs   MRSA screen +  DVT prophylaxis: SQ heparin Code Status: FULL CODE Family Communication: spoke w/ husband at bedside  Disposition Plan: SDU  Consultants:  Neurology   Antimicrobials:  Zosyn 10/23 > Vancomycin 10/23 > Ceftriaxone 10/24 > 10/25   Objective: Blood pressure 133/81, pulse 89, temperature (!) 97.5 F (36.4 C), temperature source Axillary, resp. rate (!) 22, height 5\' 4"  (1.626 m), weight 58.5 kg (129 lb), SpO2 100 %.  Intake/Output Summary (Last 24 hours) at 09/01/17 1419 Last data filed at 09/01/17 1054  Gross per 24 hour  Intake           1477.5 ml  Output  0 ml  Net           1477.5 ml   Filed Weights   08/30/17 0357 08/31/17 0342 09/01/17 0500  Weight: 58.5 kg (129 lb) 57 kg (125 lb 9.6 oz) 58.5 kg (129 lb)    Examination: General: No acute respiratory distress - encephalopathic w/o change  Lungs: CTA B w/o crackles or wheeze  Cardiovascular: RRR Abdomen: NT/ND, soft, bowel sounds positive, no rebound Extremities: No edema B LE   CBC:  Recent Labs Lab 08/26/17 0345  08/28/17 0752 08/29/17 0408  08/30/17 0450 08/31/17 0446 09/01/17 0523  WBC 20.5*  < > 23.0* 17.7* 9.7 8.7 11.5*  NEUTROABS 16.5*  --  21.0*  --   --   --   --   HGB 12.0  < > 11.7* 10.1* 9.7* 10.0* 10.3*  HCT 38.6  < > 36.4 32.4* 31.7* 32.5* 32.9*  MCV 102.7*  < > 101.4* 101.9* 101.6* 100.0 100.3*  PLT 241  < > 151 176 170 170 228  < > = values in this interval not displayed. Basic Metabolic Panel:  Recent Labs Lab 08/26/17 0857  08/27/17 1823 08/28/17 0752 08/29/17 0408 08/30/17 0450 08/31/17 0446 09/01/17 0523  NA  --   < > 135 141 139 141 140 144  K  --   < > 2.6* 3.2* 3.5 3.3* 3.3* 2.6*  CL  --   < > 100* 102 98* 99* 101 103  CO2  --   < > 23 29 29  32 28 32  GLUCOSE  --   < > 733* 123* 133* 204* 194* 48*  BUN  --   < > 14 8 7 7  <5* <5*  CREATININE  --   < > 1.16* 1.16* 1.11* 1.17* 1.15* 1.01*  CALCIUM  --   < > 6.6* 8.6* 8.4* 8.2* 8.2* 8.6*  MG 1.9  --  1.3* 1.3*  --  1.0* 2.2  --   PHOS 2.1*  --   --   --   --   --   --   --   < > = values in this interval not displayed. GFR: Estimated Creatinine Clearance: 40.9 mL/min (A) (by C-G formula based on SCr of 1.01 mg/dL (H)).  Liver Function Tests:  Recent Labs Lab 08/26/17 0345 08/27/17 0400 08/28/17 0752 08/29/17 0408  AST 30 41 29 21  ALT 11* 12* 13* 11*  ALKPHOS 165* 135* 155* 130*  BILITOT 0.7 0.4 0.5 0.7  PROT 7.3 6.6 6.4* 6.0*  ALBUMIN 3.2* 2.6* 2.3* 2.2*    Recent Labs Lab 08/26/17 0857 08/28/17 0752 08/29/17 0408 09/01/17 1028  AMMONIA 24 88* 36* 60*    Coagulation Profile:  Recent Labs Lab 08/26/17 0857  INR 1.11    Cardiac Enzymes:  Recent Labs Lab 08/26/17 0857 08/26/17 1902 08/26/17 2332  CKTOTAL  --  204  --   TROPONINI <0.03 0.03* 0.03*    HbA1C: Hgb A1c MFr Bld  Date/Time Value Ref Range Status  08/26/2017 08:57 AM 6.5 (H) 4.8 - 5.6 % Final    Comment:    (NOTE) Pre diabetes:          5.7%-6.4% Diabetes:              >6.4% Glycemic control for   <7.0% adults with diabetes   06/07/2017  04:19 AM 7.6 (H) 4.8 - 5.6 % Final    Comment:    (NOTE)  Pre-diabetes: 5.7 - 6.4         Diabetes: >6.4         Glycemic control for adults with diabetes: <7.0     CBG:  Recent Labs Lab 08/31/17 1637 08/31/17 2127 09/01/17 0724 09/01/17 0757 09/01/17 1154  GLUCAP 132* 123* 49* 206* 137*    Recent Results (from the past 240 hour(s))  Blood Culture (routine x 2)     Status: None   Collection Time: 08/26/17  3:45 AM  Result Value Ref Range Status   Specimen Description BLOOD RIGHT ANTECUBITAL  Final   Special Requests   Final    BOTTLES DRAWN AEROBIC AND ANAEROBIC Blood Culture adequate volume   Culture NO GROWTH 5 DAYS  Final   Report Status 08/31/2017 FINAL  Final  Blood Culture (routine x 2)     Status: None   Collection Time: 08/26/17  4:16 AM  Result Value Ref Range Status   Specimen Description BLOOD LEFT HAND  Final   Special Requests IN PEDIATRIC BOTTLE Blood Culture adequate volume  Final   Culture NO GROWTH 5 DAYS  Final   Report Status 08/31/2017 FINAL  Final  MRSA PCR Screening     Status: Abnormal   Collection Time: 08/27/17  3:47 AM  Result Value Ref Range Status   MRSA by PCR POSITIVE (A) NEGATIVE Final    Comment:        The GeneXpert MRSA Assay (FDA approved for NASAL specimens only), is one component of a comprehensive MRSA colonization surveillance program. It is not intended to diagnose MRSA infection nor to guide or monitor treatment for MRSA infections. RESULT CALLED TO, READ BACK BY AND VERIFIED WITH: R.PIVERJER RN 08/27/17 0724 L.CHAMPION   Culture, Urine     Status: None   Collection Time: 08/27/17 10:47 AM  Result Value Ref Range Status   Specimen Description URINE, CATHETERIZED  Final   Special Requests NONE  Final   Culture NO GROWTH  Final   Report Status 08/28/2017 FINAL  Final  CSF culture with Stat gram stain     Status: None   Collection Time: 08/27/17  3:44 PM  Result Value Ref Range Status   Specimen Description  CSF  Final   Special Requests Normal  Final   Gram Stain   Final    WBC PRESENT, PREDOMINANTLY MONONUCLEAR NO ORGANISMS SEEN CYTOSPIN SMEAR    Culture NO GROWTH 3 DAYS  Final   Report Status 08/30/2017 FINAL  Final     Scheduled Meds: . aspirin  325 mg Oral Daily  . atorvastatin  10 mg Oral Daily  . heparin  5,000 Units Subcutaneous Q8H  . insulin aspart  0-5 Units Subcutaneous QHS  . insulin aspart  0-9 Units Subcutaneous TID WC  . insulin detemir  8 Units Subcutaneous QHS  . metoprolol tartrate  5 mg Intravenous Q6H  . pantoprazole  40 mg Oral Q1200     LOS: 6 days   Cherene Altes, MD [facility] Office  [phone] Pager - Text Page per Amion as per below:  On-Call/Text Page:      [email or web address]      password [email or web address]  If 7PM-7AM, please contact night-coverage [email or web address] Password [email or web address] 09/01/2017, 2:19 PM

## 2017-09-01 NOTE — Procedures (Signed)
LTM-EEG Report  HISTORY: Continuous video-EEG monitoring performed for 76 year old with encephalopathy, fluctuant mental status.  ACQUISITION: International 10-20 system for electrode placement; 18 channels with additional eyes linked to ipsilateral ears and EKG. Additional T1-T2 electrodes were used. Continuous video recording obtained.   EEG NUMBER:  MEDICATIONS:  Day 1: VPA  DAY #1: from 6384 08/31/17 to 0730 09/01/17  BACKGROUND: An overall medium voltage continuous recording with some spontaneous variability and reactivity. Waking background consisted of medium voltage theta-delta activity bilaterally with sparse superimposed faster frequencies and no clear evidence of a posterior basic rhythm. State changes were captured with some sleep architecture at times (spindles).   EPILEPTIFORM/PERIODIC ACTIVITY: There were intermittent runs of generalized periodic discharges (GPDs), sharply contoured in morphology, frequency 1-1.5hz , usually present bilaterally but maximal in the left posterior head regions. These showed no clinical correlate and no ictal evolution. SEIZURES: none EVENTS: Several events of leg trembling were captured on video without EEG correlate  EKG: no significant arrhythmia  SUMMARY: This was an abnormal continuous video EEG due to diffuse slow activity with periodic discharges bilaterally but asymmetric, maximal in the left posterior head regions. This was indicative of a severe toxic-metabolic encephalopathy pattern with superimposed focal cerebral disturbance over the right posterior regions (based on previous brain hemorrhage on that side). No seizure were seen.

## 2017-09-01 NOTE — Progress Notes (Signed)
Nutrition Follow-up  DOCUMENTATION CODES:   Severe malnutrition in context of chronic illness  INTERVENTION:   - If tube feed desired, recommend: Jevity 1.2 @ 20 mL advanced by 10 mL every 6 hours to eventual goal rate of 50 mL/hour providing 1440 kcals, 67 grams of protein, and 968 mL water.  - Discontinue Ensure Enlive  - Mighty shake BID BM, each supplement provides: 500 kcals and 23 grams of protein  NUTRITION DIAGNOSIS:   Severe Malnutrition related to chronic illness, poor appetite (hip fracture recovery, poor PO intake) as evidenced by energy intake < 75% for > or equal to 1 month, percent weight loss, per patient/family report, moderate fat depletion, moderate muscle depletion. Ongoing  GOAL:   Patient will meet greater than or equal to 90% of their needs Unmet  MONITOR:   PO intake, Supplement acceptance, Labs, Weight trends    ASSESSMENT:   Pt is a 76 year old female admitted for a seizure and AMS. PMH of anemia, CKD, HTN, HLD, DM, GERD, depression and a hip fracture.  Spoke with pts husband who adamantly refuses a tube feed for his wife. He states that he knows it will cause her to gag and vomit and he doesn't want to put her through anything more. Reminded the husband that pt has not ate anything since admission on 10/24 and that nutrition would be very helpful in her healing process. Pts husband again refused. Pt has lost 6 lbs since last visit on 10/26.   Pts husband reports that wife drank a glass of orange juice this morning and drank some water yesterday. Ensures have not been given to the pt by RNs as she was unable to safely drink them.   Medications- Heparin, Novolog, Protonix  Labs- CBGs: 123-49-206, K 2.6 (L), BUN <5 (L), Cr 1.01 (H), Ca 8.6 (L)  Diet Order:  Diet regular Room service appropriate? Yes; Fluid consistency: Thin  EDUCATION NEEDS:   No education needs have been identified at this time  Skin:  Skin Assessment: Reviewed RN  Assessment  Last BM:  10/26  Height:   Ht Readings from Last 1 Encounters:  08/26/17 5\' 4"  (1.626 m)    Weight:   Wt Readings from Last 1 Encounters:  09/01/17 129 lb (58.5 kg)    Ideal Body Weight:  54.5 kg  BMI:  Body mass index is 22.14 kg/m.  Estimated Nutritional Needs:   Kcal:  1300-1500 kcals  Protein:  65-75 grams   Fluid:  1.3-1.5 [place] [facility] Intern Pager: [phone] 09/01/2017 10:38 AM

## 2017-09-01 NOTE — Progress Notes (Signed)
Patient with 24 hour EEG in place throughout evening. Patient afebrile and VS stable. Oriented to self. Follows simple commands.   While providing oral care, patients alertness increased and she asked for water. Patient sat up at a 45 degree angle and offered a small sip of water with suction set up at bedside. Patient successfully drank 1/2 cup of water without coughing or choking.   Right hip incision with slight redness to proximal end. Area hard and warm.

## 2017-09-01 NOTE — Progress Notes (Signed)
Subjective: Patient is resting comfortably in bed.  To vocal stimuli she will open her eyes and look at me but does not talk.  Per nurse who is in the room apparently earlier this morning she was more alert able to tell the nurse who her husband was and communicate.  It seems as though her mentation waxes and wanes throughout the day.  Exam: Vitals:   09/01/17 0747 09/01/17 0840  BP: (!) 151/71 (!) 151/83  Pulse: 88 89  Resp: 11 19  Temp:  (!) 96.9 F (36.1 C)  SpO2: 100% 100%    HEENT-  Normocephalic, no lesions, without obvious abnormality.  Normal external eye and conjunctiva.  Normal TM's bilaterally.  Normal auditory canals and external ears. Normal external nose, mucus membranes and septum.  Normal pharynx. Cardiovascular- S1, S2 normal, pulses palpable throughout   Lungs- chest clear, no wheezing, rales, normal symmetric air entry, Heart exam - S1, S2 normal, no murmur, no gallop, rate regular    Neuro:  CN: Pupils are equal and round. They are symmetrically reactive from 3-->2 mm. EOMI without nystagmus. Facial sensation is intact to light touch. Face is symmetric at rest with normal strength and mobility. Hearing is intact to conversational voice. Palate elevates symmetrically and uvula is midline. Voice is normal in tone, pitch and quality. Bilateral SCM and trapezii are 5/5. Tongue is midline with normal bulk and mobility.  Motor: Normal bulk, tone, and strength. 5/5 throughout. No drift.  Sensation: Intact to light touch.  DTRs: 2+, symmetric  Toes downgoing bilaterally. No pathologic reflexes.  Coordination: Finger-to-nose and heel-to-shin are without dysmetria   Medications:  Scheduled: . aspirin  325 mg Oral Daily  . atorvastatin  10 mg Oral Daily  . feeding supplement (ENSURE ENLIVE)  237 mL Oral BID BM  . heparin  5,000 Units Subcutaneous Q8H  . insulin aspart  0-5 Units Subcutaneous QHS  . insulin aspart  0-9 Units Subcutaneous TID WC  . insulin detemir  8 Units  Subcutaneous QHS  . metoprolol tartrate  5 mg Intravenous Q6H  . mupirocin ointment  1 application Nasal BID  . pantoprazole  40 mg Oral Q1200   Continuous: . dextrose 5 % and 0.9% NaCl 50 mL/hr at 08/31/17 0616  . levETIRAcetam    . levETIRAcetam    . potassium chloride 10 mEq (09/01/17 0841)  . valproate sodium Stopped (08/31/17 2305)   LSL:[id number] **OR** acetaminophen, ibuprofen, LORazepam, ondansetron **OR** ondansetron (ZOFRAN) IV  Pertinent Labs/Diagnostics: Potassium 2.6 Creatinine 1.01 Blood cell count 11.5 Glucose 48 (patient has had very unstable glucose with ranges from 206 down to 49 within a 24-hour.)  LTM---SUMMARY: This was an abnormal continuous video EEG due to diffuse slow activity with periodic discharges bilaterally but asymmetric, maximal in the left posterior head regions. This was indicative of a severe toxic-metabolic encephalopathy pattern with superimposed focal cerebral disturbance over the right posterior regions (based on previous brain hemorrhage on that side). No seizure were seen.    Impression: This is a 76 year old woman who has had a past medical history of right parietal occipital hemorrhage, seizures, chronic kidney disease.  Her mental status initially started to improve with the treatment of the UTI however since then her mental status has been waxing and waning.  Patient has recently had an LTM which did not show any epileptiform activity but did show periodic discharges bilaterally but asymmetric with maximal left posterior head regions.  This was indicative of severe toxic metabolic encephalopathy pattern with  superimposed focal cerebral disturbances again no seizures were seen.   Recommendations: 1) at this time we will add Keppra.  We will load her with 1000 mg now with a 500 mg twice daily maintenance. 2) we will continue the LTM 3) we will continue Depakote 750 mg twice daily--due to the fact that she is on Depakote I will order a  Depakote level now in addition to an ammonia level to ensure that she is not obtaining a elevated ammonia throughout her Depakote treatment.   Etta Quill PA-C [facility] [phone]  09/01/2017, 9:42 AM

## 2017-09-01 NOTE — Progress Notes (Signed)
Inpatient Diabetes Program Recommendations  AACE/ADA: New Consensus Statement on Inpatient Glycemic Control (2015)  Target Ranges:  Prepandial:   less than 140 mg/dL      Peak postprandial:   less than 180 mg/dL (1-2 hours)      Critically ill patients:  140 - 180 mg/dL   Lab Results  Component Value Date   GLUCAP 137 (H) 09/01/2017   HGBA1C 6.5 (H) 08/26/2017    Review of Glycemic ControlResults for [name], [name] [name] (MRN [id number]) as of 09/01/2017 14:38  Ref. Range 08/31/2017 16:37 08/31/2017 21:27 09/01/2017 07:24 09/01/2017 07:57 09/01/2017 11:54  Glucose-Capillary Latest Ref Range: 65 - 99 mg/dL 132 (H) 123 (H) 49 (L) 206 (H) 137 (H)   Diabetes history: Type 2 diabetes Outpatient Diabetes medications: Levemir 8 units daily, Novolog 4-9 units tid with meals Current orders for Inpatient glycemic control:  Novolog sensitive tid with meals and HS  Inpatient Diabetes Program Recommendations:   Note low blood sugar this morning. Please reduce Levemir to 4 units q HS.  Text page sent.   Thanks, Adah Perl, RN, BC-ADM Inpatient Diabetes Coordinator Pager [phone] (8a-5p)

## 2017-09-01 NOTE — Progress Notes (Signed)
Dr. Thereasa Solo paged the following at [phone]:  6E28: Pangilinan, L. Patients potassium 2.6. Pt is running D5NS at 5ml/hr

## 2017-09-01 NOTE — Evaluation (Signed)
Clinical/Bedside Swallow Evaluation Patient Details  Name: [name] [name] MRN: [id number] Date of Birth: [birth year]  Today's Date: 09/01/2017 Time: SLP Start Time (ACUTE ONLY): 4098 SLP Stop Time (ACUTE ONLY): 1313 SLP Time Calculation (min) (ACUTE ONLY): 8 min  Past Medical History:  Past Medical History:  Diagnosis Date  . Anemia   . Arthritis    "a touch in my hands" (11/17/2016)  . Chronic back pain   . Chronic kidney disease (CKD), active medical management without dialysis, stage 3 (moderate) ([place])   . Depression   . GERD (gastroesophageal reflux disease)   . High cholesterol   . Hyperlipidemia   . Hypertension   . Seizures ([place])    last seizure 2014 (11/17/2016)  . Stroke [facility]) 2013   Secondary to acute right posterior temporo-occipital intra-axial hemorrhage  . Type II diabetes mellitus ([place])    Past Surgical History:  Past Surgical History:  Procedure Laterality Date  . CATARACT EXTRACTION W/ INTRAOCULAR LENS IMPLANT Bilateral   . COLONOSCOPY    . DILATION AND CURETTAGE OF UTERUS  X 3  . 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 TOTAL HIP ARTHROPLASTY Right 07/28/2017   Procedure: TOTAL HIP ARTHROPLASTY ANTERIOR APPROACH;  Surgeon: Mcarthur Rossetti, MD;  Location: [place];  Service: Orthopedics;  Laterality: Right;   HPI:  39yoF w/ a Hx CVA (right posterior temporo-occipital intra-axial hemorrhage), Seizures, Depression, Anemia, Chronic back pain, CKD stage III, HLD, HTN, DM2, GERD, and Hip fracture who suffered a seizure on the day of admission, lasting 30-45 minutes. Pt was also septic, likely due to UTI. Pt has had multiple swallowing evaluations in the past, although most recent recommendations have been for regular diet textures and thin liquids.    Assessment / Plan / Recommendation Clinical Impression  Pt makes no  attempts at oral acceptance despite oral stimulation. She did have moderately sized particles of food adhered to her aveolar ridge/hard palate, and she did try to purse her lips tightly together when SLP was manually removing them. Otherwise, she is lethargic and shows no awareness of boluses. RN reports that earlier this morning when pt was fully alert she ate/drink without incidence. Suspect that her mentation is her biggest barrier to safe PO intake at this time. Recommend holding POs while she is lethargic, but allowing her to try intake with full supervision when fully awake. Will f/u to assess for tolerance when she is able to consume POs with SLP. SLP Visit Diagnosis: Dysphagia, unspecified (R13.10)    Aspiration Risk  Moderate aspiration risk    Diet Recommendation Regular;Thin liquid   Liquid Administration via: Cup;Straw Medication Administration: Crushed with puree Supervision: Staff to assist with self feeding;Full supervision/cueing for compensatory strategies Compensations: Minimize environmental distractions;Slow rate;Small sips/bites;Other (Comment) (must be fully alert; hold if coughing or not alert) Postural Changes: Seated upright at 90 degrees    Other  Recommendations Oral Care Recommendations: Oral care QID Other Recommendations: Have oral suction available   Follow up Recommendations  (tba)      Frequency and Duration min 2x/week  2 weeks       Prognosis Prognosis for Safe Diet Advancement: Good Barriers to Reach Goals: Cognitive deficits      Swallow Study   General HPI: 57yoF w/ a Hx CVA (right posterior temporo-occipital intra-axial hemorrhage), Seizures, Depression, Anemia, Chronic back pain, CKD stage  III, HLD, HTN, DM2, GERD, and Hip fracture who suffered a seizure on the day of admission, lasting 30-45 minutes. Pt was also septic, likely due to UTI. Pt has had multiple swallowing evaluations in the past, although most recent recommendations have been for  regular diet textures and thin liquids.  Type of Study: Bedside Swallow Evaluation Previous Swallow Assessment: see HPI Diet Prior to this Study: Regular;Thin liquids Temperature Spikes Noted: Yes (100.6) Respiratory Status: Nasal cannula History of Recent Intubation: No Behavior/Cognition: Lethargic/Drowsy;Doesn't follow directions Oral Cavity Assessment: Other (comment) (food particles) Oral Care Completed by SLP: Other (Comment) (enough to remove food debris) Oral Cavity - Dentition: Adequate natural dentition Vision:  (mostly keeps eyes closed) Self-Feeding Abilities: Total assist Patient Positioning: Upright in bed Baseline Vocal Quality: Not observed Volitional Cough: Cognitively unable to elicit Volitional Swallow: Unable to elicit    Oral/Motor/Sensory Function Overall Oral Motor/Sensory Function:  (difficult to assess)   Ice Chips Ice chips: Impaired Presentation: Spoon Oral Phase Impairments: Poor awareness of bolus;Other (comment) (no attempts at oral acceptance)   Thin Liquid Thin Liquid: Not tested    Nectar Thick Nectar Thick Liquid: Not tested   Honey Thick Honey Thick Liquid: Not tested   Puree Puree: Not tested   Solid   GO   Solid: Not tested        [name] 09/01/2017,3:22 PM  [name], M.A. CCC-SLP [phone]

## 2017-09-02 DIAGNOSIS — E43 Unspecified severe protein-calorie malnutrition: Secondary | ICD-10-CM

## 2017-09-02 LAB — COMPREHENSIVE METABOLIC PANEL
ALBUMIN: 1.8 g/dL — AB (ref 3.5–5.0)
ALK PHOS: 88 U/L (ref 38–126)
ALT: 7 U/L — AB (ref 14–54)
AST: 17 U/L (ref 15–41)
Anion gap: 5 (ref 5–15)
CALCIUM: 8.4 mg/dL — AB (ref 8.9–10.3)
CO2: 32 mmol/L (ref 22–32)
CREATININE: 1 mg/dL (ref 0.44–1.00)
Chloride: 105 mmol/L (ref 101–111)
GFR calc non Af Amer: 53 mL/min — ABNORMAL LOW (ref >60)
GLUCOSE: 193 mg/dL — AB (ref 65–99)
Potassium: 4.3 mmol/L (ref 3.5–5.1)
SODIUM: 142 mmol/L (ref 135–145)
Total Bilirubin: 0.5 mg/dL (ref 0.3–1.2)
Total Protein: 5.3 g/dL — ABNORMAL LOW (ref 6.5–8.1)

## 2017-09-02 LAB — MAGNESIUM
MAGNESIUM: 1.2 mg/dL — AB (ref 1.7–2.4)
Magnesium: 1.9 mg/dL (ref 1.7–2.4)
Magnesium: 1.7 mg/dL (ref 1.7–2.4)

## 2017-09-02 LAB — CBC
HCT: 33.4 % — ABNORMAL LOW (ref 36.0–46.0)
Hemoglobin: 10.2 g/dL — ABNORMAL LOW (ref 12.0–15.0)
MCH: 31.2 pg (ref 26.0–34.0)
MCHC: 30.5 g/dL (ref 30.0–36.0)
MCV: 102.1 fL — ABNORMAL HIGH (ref 78.0–100.0)
PLATELETS: 228 10*3/uL (ref 150–400)
RBC: 3.27 MIL/uL — ABNORMAL LOW (ref 3.87–5.11)
RDW: 15.1 % (ref 11.5–15.5)
WBC: 7.9 10*3/uL (ref 4.0–10.5)

## 2017-09-02 LAB — PHOSPHORUS
PHOSPHORUS: 2.1 mg/dL — AB (ref 2.5–4.6)
Phosphorus: 2.1 mg/dL — ABNORMAL LOW (ref 2.5–4.6)

## 2017-09-02 LAB — GLUCOSE, CAPILLARY
GLUCOSE-CAPILLARY: 166 mg/dL — AB (ref 65–99)
GLUCOSE-CAPILLARY: 106 mg/dL — AB (ref 65–99)
GLUCOSE-CAPILLARY: 228 mg/dL — AB (ref 65–99)
Glucose-Capillary: 189 mg/dL — ABNORMAL HIGH (ref 65–99)

## 2017-09-02 LAB — AMMONIA: AMMONIA: 67 umol/L — AB (ref 9–35)

## 2017-09-02 LAB — VITAMIN B12: Vitamin B-12: 1319 pg/mL — ABNORMAL HIGH (ref 180–914)

## 2017-09-02 LAB — FOLATE: Folate: 30 ng/mL (ref >5.9)

## 2017-09-02 MED ORDER — CHLORHEXIDINE GLUCONATE 0.12 % MT SOLN
15.0000 mL | Freq: Two times a day (BID) | OROMUCOSAL | Status: DC
  Administered 2017-09-02 – 2017-09-10 (×18): 15 mL via OROMUCOSAL
  Filled 2017-09-02 (×18): qty 15

## 2017-09-02 MED ORDER — LEVETIRACETAM 100 MG/ML PO SOLN
500.0000 mg | Freq: Two times a day (BID) | ORAL | Status: DC
  Administered 2017-09-02 – 2017-09-10 (×17): 500 mg
  Filled 2017-09-02 (×18): qty 5

## 2017-09-02 MED ORDER — ACETAMINOPHEN 160 MG/5ML PO SOLN
650.0000 mg | Freq: Four times a day (QID) | ORAL | Status: DC | PRN
Start: 2017-09-02 — End: 2017-09-11
  Administered 2017-09-02 – 2017-09-05 (×3): 650 mg
  Filled 2017-09-02 (×4): qty 20.3

## 2017-09-02 MED ORDER — ORAL CARE MOUTH RINSE
15.0000 mL | Freq: Two times a day (BID) | OROMUCOSAL | Status: DC
  Administered 2017-09-02 – 2017-09-09 (×12): 15 mL via OROMUCOSAL

## 2017-09-02 MED ORDER — MAGNESIUM SULFATE 2 GM/50ML IV SOLN
2.0000 g | Freq: Once | INTRAVENOUS | Status: AC
  Administered 2017-09-02: 2 g via INTRAVENOUS
  Filled 2017-09-02: qty 50

## 2017-09-02 MED ORDER — PANTOPRAZOLE SODIUM 40 MG PO PACK
40.0000 mg | PACK | Freq: Every day | ORAL | Status: DC
  Administered 2017-09-02 – 2017-09-10 (×8): 40 mg
  Filled 2017-09-02 (×8): qty 20

## 2017-09-02 MED ORDER — JEVITY 1.2 CAL PO LIQD
1000.0000 mL | ORAL | Status: DC
  Administered 2017-09-02 – 2017-09-07 (×5): 1000 mL
  Filled 2017-09-02 (×10): qty 1000

## 2017-09-02 MED ORDER — ACETAMINOPHEN 650 MG RE SUPP
650.0000 mg | Freq: Four times a day (QID) | RECTAL | Status: DC | PRN
  Administered 2017-09-10: 650 mg via RECTAL
  Filled 2017-09-02: qty 1

## 2017-09-02 NOTE — Progress Notes (Signed)
Cortrak Tube Team Note:  Consult received to place a Cortrak feeding tube.   A 10 F Cortrak tube was placed in the L nare and secured with a nasal bridle at 75 cm. Per the Cortrak monitor reading the tube is in the stomach with tip in pylorus region.  Spoke with RN who will enter RD consult for tube feeding.   No x-ray is required. RN may begin using tube.   If the tube becomes dislodged please keep the tube and contact the Cortrak team at [email or web address] (password [email or web address]) for replacement.  If after hours and replacement cannot be delayed, place a NG tube and confirm placement with an abdominal x-ray.    [place], [place], [place] Pager [phone] After Hours Pager

## 2017-09-02 NOTE — Progress Notes (Signed)
PROGRESS NOTE    [name] [name]  NAT:[id number] DOB: August 12, 1941 DOA: 08/26/2017 PCP: [name], MD   Brief Narrative:  42HC WC PMHx CVA (right posterior temporo-occipital intra-axial hemorrhage), Seizures, Depression, Anemia, Chronic back pain, CKD stage III, HLD, HTN, DM Type 2, and Hip fracture  Who suffered a seizure on the day of admission, lasting 30-45 minutes. It was relieved only when she was given Ativan in the ED.   Husband refused placement of a Cortrak NG feeding tube for now and wants to wait another day or two to see if she will wake up to eat.  Her mental status reportedly continues to wax and wane, though at the time of my visit she is very lethargic, barely opening her eyes to voice.      Subjective: 10/31 eyes open looks around room but unresponsive. Does not follow commands. Withdraws to painful stimuli.   Assessment & Plan:   Active Problems:   GERD (gastroesophageal reflux disease)   DM type 2 (diabetes mellitus, type 2) (H[facility])   HTN (hypertension)   CKD (chronic kidney disease) stage 3, GFR 30-59 ml/min (H[facility])   Acute encephalopathy   Seizure disorder, grand mal ([place])   UTI (urinary tract infection)   Sepsis, unspecified organism ([place])   Status epilepticus ([place])   S/P right hip fracture   Seizure disorder -Care for neurology. Continuous EEG ongoing. -Keppra 500 mg BID  Sepsis unspecified organism/UTI? -  afebrile last 24.  -Completed 7 day of antibiotics -Cultures to date negative -Continue off antibiotics if patient positive leukocytosis or spikes fever will panculture   Encephalopathy -Unknown cause Previously felt to be both postictal and septic encephalopathy  - LP 10/25 per Neuro unrevealing  - ammonia fluctuation, but in setting of fluctuating mental status will proceed w/ tx - B12 and folate not low  -24 hour EEG in progress - if pt does not improve signif over the next 24-48hrs will consider consulting Palliative Care    Chronic  Diastolic CHF --negative significant volume overload. -Strict in and out -Daily weight  -Metoprolol  Essential HTN -See CHF     CAD   CKD stage III  Recent Labs Lab 08/28/17 0752 08/29/17 0408 08/30/17 0450 08/31/17 0446 09/01/17 0523 09/02/17 0306  CREATININE 1.16* 1.11* 1.17* 1.15* 1.01* 1.00  -Resolved    Hypokalemia -Potassium goal> 4 -Resolved Persists   Hypomagnesemia -Magnesium goal> 2 -Magnesium IV 2 g   RIGHT Hip Fracture -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 Ninfa Linden.   Diabetes Type 2 controlled with renal complication / Hypoglycemia  -10/24 Hemoglobin A1C= 6.5 -suffered an episode of hypoglycemia during this hospitalization. Currently CBG well controlled   Severe protein calorie malnutrition in context of chronic illness -CorTrak tube placed 10/31. Tube feeds begun will slowly titrate up.      DVT prophylaxis: Subcutaneous heparin Code Status: Full Family Communication: None Disposition Plan: TBD   Consultants:  Neurology   Procedures/Significant Events:  10/24 CT head WO contrast: Negative acute infarct, mass/lesion     I have personally reviewed and interpreted all radiology studies and my findings are as above.  VENTILATOR SETTINGS:    Cultures 10/24 blood negative 10/25 MRSA by PCR positive 10/24 influenza A/B negative 10/25 cryptococcal antigen negative 10/25 urine negative 10/25 CSF NGTD    Antimicrobials: Anti-infectives    Start     Stop   08/28/17 2030  vancomycin (VANCOCIN) IVPB 1000 mg/200 mL premix     08/31/17 2153   08/28/17 0230  piperacillin-tazobactam (ZOSYN) IVPB 3.375 g     08/31/17 2305   08/27/17 2030  piperacillin-tazobactam (ZOSYN) IVPB 3.375 g     08/27/17 2137   08/27/17 2030  vancomycin (VANCOCIN) 1,250 mg in sodium chloride 0.9 % 250 mL IVPB     08/27/17 2237   08/27/17 0900  cefTRIAXone (ROCEPHIN) 1 g in dextrose 5 % 50 mL IVPB  Status:  Discontinued     08/27/17 1911    08/26/17 0900  cefTRIAXone (ROCEPHIN) 2 g in dextrose 5 % 50 mL IVPB     08/26/17 1128   08/26/17 0400  vancomycin (VANCOCIN) IVPB 1000 mg/200 mL premix     08/26/17 0509   08/26/17 0400  piperacillin-tazobactam (ZOSYN) IVPB 3.375 g     08/26/17 0452       Devices    LINES / TUBES:  CorTrack tube 10/31>>>    Continuous Infusions: . dextrose 5 % and 0.9 % NaCl with KCl 40 mEq/L 75 mL/hr at 09/01/17 1550  . levETIRAcetam Stopped (09/01/17 2215)     Objective: Vitals:   09/01/17 2346 09/02/17 0416 09/02/17 0558 09/02/17 0808  BP: (!) 136/50 137/64 138/62 (!) 146/66  Pulse: 92 87 88 95  Resp: 18 19 13 15   Temp: 98.1 F (36.7 C) 98.3 F (36.8 C)  99.8 F (37.7 C)  TempSrc:    Axillary  SpO2: 100% 100% 100% 98%  Weight:  132 lb (59.9 kg)    Height:        Intake/Output Summary (Last 24 hours) at 09/02/17 0948 Last data filed at 09/02/17 0809  Gross per 24 hour  Intake           1997.5 ml  Output              700 ml  Net           1297.5 ml   Filed Weights   08/31/17 0342 09/01/17 0500 09/02/17 0416  Weight: 125 lb 9.6 oz (57 kg) 129 lb (58.5 kg) 132 lb (59.9 kg)    Examination:  General: eyes open,does not follow commandsNo acute respiratory distress Neck:  Negative scars, masses, torticollis, lymphadenopathy, JVD Lungs: Clear to auscultation bilaterally without wheezes or crackles Cardiovascular: Regular rate and rhythm without murmur gallop or rub normal S1 and S2 Abdomen: negative abdominal pain, nondistended, positive soft, bowel sounds, no rebound, no ascites, no appreciable mass Extremities: No significant cyanosis, clubbing, or edema bilateral lower extremities Skin: Negative rashes, lesions, ulcers Psychiatric:  Unable to evaluate secondary to altered mental status Central nervous system:  Withdraws to painful stimuli. Unable to evaluate further secondary to altered mental status  .     Data Reviewed: Care during the described time interval was  provided by me .  I have reviewed this patient's available data, including medical history, events of note, physical examination, and all test results as part of my evaluation.   CBC:  Recent Labs Lab 08/28/17 0752 08/29/17 0408 08/30/17 0450 08/31/17 0446 09/01/17 0523 09/02/17 0306  WBC 23.0* 17.7* 9.7 8.7 11.5* 7.9  NEUTROABS 21.0*  --   --   --   --   --   HGB 11.7* 10.1* 9.7* 10.0* 10.3* 10.2*  HCT 36.4 32.4* 31.7* 32.5* 32.9* 33.4*  MCV 101.4* 101.9* 101.6* 100.0 100.3* 102.1*  PLT 151 176 170 170 228 924   Basic Metabolic Panel:  Recent Labs Lab 08/27/17 1823 08/28/17 0752 08/29/17 0408 08/30/17 0450 08/31/17 2683 09/01/17 4196 09/02/17 2229  NA 135 141 139 141 140 144 142  K 2.6* 3.2* 3.5 3.3* 3.3* 2.6* 4.3  CL 100* 102 98* 99* 101 103 105  CO2 23 29 29  32 28 32 32  GLUCOSE 733* 123* 133* 204* 194* 48* 193*  BUN 14 8 7 7  <5* <5* <5*  CREATININE 1.16* 1.16* 1.11* 1.17* 1.15* 1.01* 1.00  CALCIUM 6.6* 8.6* 8.4* 8.2* 8.2* 8.6* 8.4*  MG 1.3* 1.3*  --  1.0* 2.2  --  1.2*   GFR: Estimated Creatinine Clearance: 41.3 mL/min (by C-G formula based on SCr of 1 mg/dL). Liver Function Tests:  Recent Labs Lab 08/27/17 0400 08/28/17 0752 08/29/17 0408 09/02/17 0306  AST 41 29 21 17   ALT 12* 13* 11* 7*  ALKPHOS 135* 155* 130* 88  BILITOT 0.4 0.5 0.7 0.5  PROT 6.6 6.4* 6.0* 5.3*  ALBUMIN 2.6* 2.3* 2.2* 1.8*   No results for input(s): LIPASE, AMYLASE in the last 168 hours.  Recent Labs Lab 08/28/17 0752 08/29/17 0408 09/01/17 1028 09/02/17 0306  AMMONIA 88* 36* 60* 67*   Coagulation Profile: No results for input(s): INR, PROTIME in the last 168 hours. Cardiac Enzymes:  Recent Labs Lab 08/26/17 1902 08/26/17 2332  CKTOTAL 204  --   TROPONINI 0.03* 0.03*   BNP (last 3 results) No results for input(s): PROBNP in the last 8760 hours. HbA1C: No results for input(s): HGBA1C in the last 72 hours. CBG:  Recent Labs Lab 09/01/17 0757 09/01/17 1154  09/01/17 1613 09/01/17 2118 09/02/17 0808  GLUCAP 206* 137* 109* 179* 166*   Lipid Profile: No results for input(s): CHOL, HDL, LDLCALC, TRIG, CHOLHDL, LDLDIRECT in the last 72 hours. Thyroid Function Tests: No results for input(s): TSH, T4TOTAL, FREET4, T3FREE, THYROIDAB in the last 72 hours. Anemia Panel: No results for input(s): VITAMINB12, FOLATE, FERRITIN, TIBC, IRON, RETICCTPCT in the last 72 hours. Urine analysis:    Component Value Date/Time   COLORURINE YELLOW 08/26/2017 0345   APPEARANCEUR CLEAR 08/26/2017 0345   LABSPEC 1.015 08/26/2017 0345   PHURINE 6.0 08/26/2017 0345   GLUCOSEU 50 (A) 08/26/2017 0345   HGBUR NEGATIVE 08/26/2017 0345   BILIRUBINUR NEGATIVE 08/26/2017 0345   BILIRUBINUR neg 08/16/2012 1531   KETONESUR 5 (A) 08/26/2017 0345   PROTEINUR 30 (A) 08/26/2017 0345   UROBILINOGEN 0.2 06/25/2013 1002   NITRITE NEGATIVE 08/26/2017 0345   LEUKOCYTESUR TRACE (A) 08/26/2017 0345   Sepsis Labs: @LABRCNTIP (procalcitonin:4,lacticidven:4)  ) Recent Results (from the past 240 hour(s))  Blood Culture (routine x 2)     Status: None   Collection Time: 08/26/17  3:45 AM  Result Value Ref Range Status   Specimen Description BLOOD RIGHT ANTECUBITAL  Final   Special Requests   Final    BOTTLES DRAWN AEROBIC AND ANAEROBIC Blood Culture adequate volume   Culture NO GROWTH 5 DAYS  Final   Report Status 08/31/2017 FINAL  Final  Blood Culture (routine x 2)     Status: None   Collection Time: 08/26/17  4:16 AM  Result Value Ref Range Status   Specimen Description BLOOD LEFT HAND  Final   Special Requests IN PEDIATRIC BOTTLE Blood Culture adequate volume  Final   Culture NO GROWTH 5 DAYS  Final   Report Status 08/31/2017 FINAL  Final  MRSA PCR Screening     Status: Abnormal   Collection Time: 08/27/17  3:47 AM  Result Value Ref Range Status   MRSA by PCR POSITIVE (A) NEGATIVE Final    Comment:  The GeneXpert MRSA Assay (FDA approved for NASAL  specimens only), is one component of a comprehensive MRSA colonization surveillance program. It is not intended to diagnose MRSA infection nor to guide or monitor treatment for MRSA infections. RESULT CALLED TO, READ BACK BY AND VERIFIED WITH: R.PIVERJER RN 08/27/17 0724 L.CHAMPION   Culture, Urine     Status: None   Collection Time: 08/27/17 10:47 AM  Result Value Ref Range Status   Specimen Description URINE, CATHETERIZED  Final   Special Requests NONE  Final   Culture NO GROWTH  Final   Report Status 08/28/2017 FINAL  Final  CSF culture with Stat gram stain     Status: None   Collection Time: 08/27/17  3:44 PM  Result Value Ref Range Status   Specimen Description CSF  Final   Special Requests Normal  Final   Gram Stain   Final    WBC PRESENT, PREDOMINANTLY MONONUCLEAR NO ORGANISMS SEEN CYTOSPIN SMEAR    Culture NO GROWTH 3 DAYS  Final   Report Status 08/30/2017 FINAL  Final         Radiology Studies: No results found.      Scheduled Meds: . aspirin  325 mg Oral Daily  . atorvastatin  10 mg Oral Daily  . heparin  5,000 Units Subcutaneous Q8H  . insulin aspart  0-5 Units Subcutaneous QHS  . insulin aspart  0-9 Units Subcutaneous TID WC  . lactulose  10 g Oral TID  . metoprolol tartrate  5 mg Intravenous Q6H  . pantoprazole  40 mg Oral Q1200   Continuous Infusions: . dextrose 5 % and 0.9 % NaCl with KCl 40 mEq/L 75 mL/hr at 09/01/17 1550  . levETIRAcetam Stopped (09/01/17 2215)     LOS: 7 days    Time spent: 40 minutes    Kellyjo Edgren, Geraldo Docker, MD [facility] Pager [phone]   If 7PM-7AM, please contact night-coverage [email or web address] Password [email or web address] 09/02/2017, 9:48 AM

## 2017-09-02 NOTE — Progress Notes (Addendum)
0730 Bedside shift report, pt resting. Pt on continuous EEG. IV lines checked and verified. Will continue to monitor.   0800 Pt assessed, opens eyes but does not track. Does not follow any commands, moves extremities x4 spontaneously, not to command. Pt incontinent of bowel and bladder. Pt cleaned and repositioned, fall precautions in place. WCTM.   0830 Speech therapy at bedside attempting to feed pt breakfast. Pt ate about 1/2 spoon of oatmeal. ST unable to determine if food was swallowed or pocketed as pt would not follow command to open mouth. Diet changed to Dys 1, thin liquids.   1005 Pt medicated with IV meds only. Pt unable to follow commands to open mouth or swallow. Husband at bedside. RN updated with POC and discussed feeding tube placement. RN informed husband that pt needs for medications and nourishment as husband informed RN that pt has not eaten in 9 days. Dr. Sherral Hammers paged for order.   1430 Cortrack tube placed by RD. Dr. Sherral Hammers paged about tube feed orders.   1600 Pt warm to touch, temp 100.8 axillary, tylenol given.   1800 Tube feeds started, pt with no new changes in assessment. WCTM  1915 Bedside shift report to [place], Therapist, sports. Pt resting comfortably, NAD.

## 2017-09-02 NOTE — Progress Notes (Addendum)
  Speech Language Pathology Treatment: Dysphagia;Cognitive-Linquistic  Patient Details Name: [name] [name] MRN: [id number] DOB: 1941/06/17 Today's Date: 09/02/2017 Time: 2751-7001 SLP Time Calculation (min) (ACUTE ONLY): 14 min  Assessment / Plan / Recommendation Clinical Impression  Pt not following commands with max multimodal cues, decreased awareness and intermittently smiles at therapist. Accepted small sips juice via cue (unable to cognitively use straw this am) followed by 4-5 swallows indicative of either piecemeal swallows or more likely pharyngeal residue. One small bite of oatmeal consumed; SLP stopped feeding as pt not able to safely or efficiently consume po's from a cognitive standpoint this am. Uncertain of pt's baseline. RN reported family refused NGT last night. Diet texture downgraded to puree, continue thin, crush meds, eat only when alert and able with full supervision and assist. No verbalizations with max requests, unable to follow commands this morning given verbal/visual/tactile cues. Will continue ST intervention.    HPI HPI: 45yoF w/ a Hx CVA (right posterior temporo-occipital intra-axial hemorrhage), Seizures, Depression, Anemia, Chronic back pain, CKD stage III, HLD, HTN, DM2, GERD, and Hip fracture who suffered a seizure on the day of admission, lasting 30-45 minutes. Pt was also septic, likely due to UTI. Pt has had multiple swallowing evaluations in the past, although most recent recommendations have been for regular diet textures and thin liquids.       SLP Plan  Continue with current plan of care       Recommendations  Diet recommendations: Dysphagia 1 (puree);Thin liquid Liquids provided via: Cup;Straw Medication Administration: Crushed with puree Supervision: Staff to assist with self feeding;Full supervision/cueing for compensatory strategies Compensations: Minimize environmental distractions;Slow rate;Small sips/bites Postural Changes and/or Swallow  Maneuvers: Seated upright 90 degrees                Oral Care Recommendations: Oral care BID Follow up Recommendations: Skilled Nursing facility SLP Visit Diagnosis: Dysphagia, unspecified (R13.10);Cognitive communication deficit (V49.449) Plan: Continue with current plan of care       GO                [name] [name] 09/02/2017, 9:17 AM  [name] [name].Ed [employer] [phone]

## 2017-09-02 NOTE — Procedures (Signed)
LTM-EEG Report  HISTORY: Continuous video-EEG monitoring performed for 76 year old with encephalopathy, fluctuant mental status.  ACQUISITION: International 10-20 system for electrode placement; 18 channels with additional eyes linked to ipsilateral ears and EKG. Additional T1-T2 electrodes were used. Continuous video recording obtained.   EEG NUMBER:  MEDICATIONS:  Day 1: VPA  DAY #1: from 1950 08/31/17 to 0730 09/01/17  BACKGROUND: An overall medium voltage continuous recording with some spontaneous variability and reactivity. Waking background consisted of medium voltage theta-delta activity bilaterally with sparse superimposed faster frequencies and no clear evidence of a posterior basic rhythm. State changes were captured with some sleep architecture at times (spindles).   EPILEPTIFORM/PERIODIC ACTIVITY: There were intermittent runs of generalized periodic discharges (GPDs), sharply contoured in morphology, frequency 1-1.5hz , usually present bilaterally but maximal in the left posterior head regions. These showed no clinical correlate and no ictal evolution. SEIZURES: none EVENTS: Several events of leg trembling were captured on video without EEG correlate  DAY #2: from 0730 09/01/17 to 0730 09/02/17  BACKGROUND: An overall medium voltage continuous recording with some spontaneous variability and reactivity. Waking background consisted of medium voltage theta-delta activity bilaterally with sparse superimposed faster frequencies and no clear evidence of a posterior basic rhythm. State changes were captured with some sleep architecture at times (spindles).   EPILEPTIFORM/PERIODIC ACTIVITY: There were intermittent runs of generalized periodic discharges (GPDs), sharply contoured in morphology, frequency 1-1.5hz , usually present bilaterally but maximal in the left posterior head regions. These showed no clinical correlate and no ictal evolution. These were unchanged compared to the previous  day. SEIZURES: none EVENTS: none  EKG: no significant arrhythmia  SUMMARY: This was an abnormal continuous video EEG due to diffuse slow activity with periodic discharges bilaterally but asymmetric, maximal in the left posterior head regions. This was indicative of a severe toxic-metabolic encephalopathy pattern with superimposed focal cerebral disturbance over the right posterior regions (based on previous brain hemorrhage on that side). No seizure were seen. The recording was stable over the past 2 days.

## 2017-09-02 NOTE — Progress Notes (Signed)
Nutrition Follow-up  DOCUMENTATION CODES:   Severe malnutrition in context of chronic illness  INTERVENTION:   - Jevity 1.2 @ 20 mL advanced by 10 mL every 8 hours to eventual goal rate of 50 mL/hour providing 1440 kcals, 67 grams of protein, and 968 mL water.  - Monitor Mg, Phos, and K for refeeding risk. MD to replete as needed.   NUTRITION DIAGNOSIS:   Severe Malnutrition related to chronic illness, poor appetite (hip fracture recovery, poor PO intake) as evidenced by energy intake < 75% for > or equal to 1 month, percent weight loss, per patient/family report, moderate fat depletion, moderate muscle depletion. Ongoing  GOAL:   Patient will meet greater than or equal to 90% of their needs  Unmet  MONITOR:   PO intake, Supplement acceptance, Labs, Weight trends     ASSESSMENT:   Pt is a 76 year old female admitted for a seizure and AMS. PMH of anemia, CKD, HTN, HLD, DM, GERD, depression and a hip fracture.  Pts husband changed his mind about tube feed. Cortrak tube placed at 12:45pm. Tube feed recommendations above in intervention section.  Pt with continued poor appetite and minimal PO intake. Given poor intake for > 1 week, pt is at risk for refeeding syndrome.   Medications- Heparin, Novolog, Protonix  Labs- CBGs: 179- 166-106, BUN <5 (L), Ca 8.4 (L), Mg 1.2 (L)  Diet Order:  DIET - DYS 1 Room service appropriate? Yes; Fluid consistency: Thin  EDUCATION NEEDS:   No education needs have been identified at this time  Skin:  Skin Assessment: Reviewed RN Assessment  Last BM:  10/29  Height:   Ht Readings from Last 1 Encounters:  08/26/17 5\' 4"  (1.626 m)    Weight:   Wt Readings from Last 1 Encounters:  09/02/17 132 lb (59.9 kg)    Ideal Body Weight:  54.5 kg  BMI:  Body mass index is 22.66 kg/m.  Estimated Nutritional Needs:   Kcal:  1300-1500 kcals  Protein:  65-75 grams   Fluid:  1.3-1.5 [place] [facility]  Intern Pager: [phone] 09/02/2017 1:26 PM

## 2017-09-02 NOTE — NC FL2 (Signed)
[place] LEVEL OF CARE SCREENING TOOL     IDENTIFICATION  Patient Name: [name] [name] Birthdate: December 04, 1940 Sex: female Admission Date (Current Location): 08/26/2017  [facility] and [place] Number:  Herbalist and Address:  [facility] [place], [place] [address] [place], [place] [address]      Provider Number: [id number]  Attending Physician Name and Address:  Allie Bossier, MD  Relative Name and Phone Number:  Zorina Mallin, spouse, [phone]    Current Level of Care: Hospital Recommended Level of Care: [place] Prior Approval Number:    Date Approved/Denied:   PASRR Number: [id number]  Discharge Plan: SNF    Current Diagnoses: Patient Active Problem List   Diagnosis Date Noted  . Status epilepticus ([place]) 08/26/2017  . S/P right hip fracture 08/26/2017  . Status post total hip replacement, right 08/10/2017  . Closed right hip fracture, initial encounter ([place]) 07/27/2017  . Hip fracture ([place]) 07/27/2017  . Pressure injury of skin 06/14/2017  . Encounter for palliative care   . Goals of care, counseling/discussion   . CAP (community acquired pneumonia) 06/12/2017  . Sepsis ([place]) 06/11/2017  . Weakness 06/11/2017  . Anemia 06/11/2017  . AKI (acute kidney injury) ([place]) 06/05/2017  . Sepsis, unspecified organism ([place]) 06/05/2017  . Partial symptomatic epilepsy with complex partial seizures, not intractable, without status epilepticus ([place])   . Bacteremia due to Escherichia coli 11/18/2016  . Renal insufficiency   . Aortic atherosclerosis ([place]) 11/15/2016  . UTI (urinary tract infection) 11/15/2016  . Paranoia ([place]) 05/24/2015  . Tachycardia 07/02/2013  . Hypophosphatemia 07/02/2013  . Encounter for central line placement 07/02/2013  . Hypokalemia 07/01/2013  . Hypomagnesemia 07/01/2013  . Protein-calorie malnutrition, severe ([place]) 06/29/2013  . Leukocytosis, unspecified 06/29/2013  . Polyp of vocal cord  12/13/2012  . TIA (transient ischemic attack) 12/13/2012  . 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 Syncope 09/08/2012  . Acute respiratory failure with hypoxia ([place]) 08/20/2012  . Seizure disorder, grand mal ([place]) 08/20/2012  . Seizure ([place]) 08/20/2012  . CKD (chronic kidney disease) stage 3, GFR 30-59 ml/min (HCC) 08/19/2012  . Leukocytosis 08/19/2012  . Hemorrhage in the brain-13 x 22 x 14 mm right posterior temporo-occipital intra-axial acute 08/19/2012  . Left hip pain 08/19/2012  . Left hemiparesis ([place]) 08/19/2012  . Hemianopia, homonymous, left 08/19/2012  . Hemisensory deficit, left 08/19/2012  . Nausea 08/19/2012  . Headache(784.0) 08/19/2012  . Acute encephalopathy 08/19/2012  . Hemorrhage of brain, nontraumatic ([place]) 08/18/2012  . Dysphagia 08/18/2012  . HTN (hypertension) 08/18/2012  . DM type 2 (diabetes mellitus, type 2) ([place]) 07/26/2012  . Hypoglycemia due to insulin 07/26/2012  . GERD (gastroesophageal reflux disease)     Orientation RESPIRATION BLADDER Height & Weight     Self  Normal Incontinent Weight: 132 lb (59.9 kg) Height:  5\' 4"  (162.6 cm)  BEHAVIORAL SYMPTOMS/MOOD NEUROLOGICAL BOWEL NUTRITION STATUS      Incontinent Feeding tube (Cortrak)  AMBULATORY STATUS COMMUNICATION OF NEEDS Skin   Extensive Assist Verbally Normal                       Personal Care Assistance Level of Assistance  Bathing, Feeding, Dressing Bathing Assistance: Maximum assistance Feeding assistance: Limited assistance Dressing Assistance: Maximum assistance     Functional Limitations Info  Sight, Hearing, Speech Sight Info: Adequate Hearing Info: Adequate Speech Info: Impaired    SPECIAL CARE FACTORS FREQUENCY  PT (By licensed PT)     PT Frequency: 5x/week              Contractures Contractures Info: Not present    Additional Factors Info  Code Status, Allergies, Insulin Sliding Scale Code Status Info: Full Allergies Info:  Barbiturates, Latex   Insulin Sliding Scale Info: insulin novolog 3x/day and at bedtime, insulin levemir at bedtime       Current Medications (09/02/2017):  This is the current hospital active medication list Current Facility-Administered Medications  Medication Dose Route Frequency Provider Last Rate Last Dose  . acetaminophen (TYLENOL) tablet 650 mg  650 mg Oral Q6H PRN Cherene Altes, MD       Or  . acetaminophen (TYLENOL) suppository 650 mg  650 mg Rectal Q6H PRN Cherene Altes, MD   650 mg at 08/31/17 2138  . aspirin tablet 325 mg  325 mg Oral Daily Cherene Altes, MD   Stopped at 08/31/17 1020  . atorvastatin (LIPITOR) tablet 10 mg  10 mg Oral Daily Waldemar Dickens, MD   Stopped at 08/31/17 1019  . chlorhexidine (PERIDEX) 0.12 % solution 15 mL  15 mL Mouth Rinse BID Allie Bossier, MD      . dextrose 5 % and 0.9 % NaCl with KCl 40 mEq/L infusion   Intravenous Continuous Cherene Altes, MD 75 mL/hr at 09/02/17 1100    . feeding supplement (JEVITY 1.2 CAL) liquid 1,000 mL  1,000 mL Per Tube Continuous Allie Bossier, MD      . heparin injection 5,000 Units  5,000 Units Subcutaneous Q8H Marliss Coots, PA-C   5,000 Units at 09/02/17 9629  . ibuprofen (ADVIL,MOTRIN) tablet 400 mg  400 mg Oral Q6H PRN Cherene Altes, MD      . insulin aspart (novoLOG) injection 0-5 Units  0-5 Units Subcutaneous QHS Cherene Altes, MD   2 Units at 08/30/17 2154  . insulin aspart (novoLOG) injection 0-9 Units  0-9 Units Subcutaneous TID WC Cherene Altes, MD   2 Units at 09/02/17 [phone]  . lactulose (CHRONULAC) 10 GM/15ML solution 10 g  10 g Oral TID Cherene Altes, MD   10 g at 09/02/17 0141  . levETIRAcetam (KEPPRA) 500 mg in sodium chloride 0.9 % 100 mL IVPB  500 mg Intravenous Q12H Marliss Coots, PA-C   Stopped at 09/02/17 1023  . LORazepam (ATIVAN) injection 1-2 mg  1-2 mg Intravenous Q2H PRN Waldemar Dickens, MD   1 mg at 08/26/17 1541  . MEDLINE mouth rinse  15 mL Mouth  Rinse q12n4p Allie Bossier, MD   15 mL at 09/02/17 1202  . metoprolol tartrate (LOPRESSOR) injection 5 mg  5 mg Intravenous Q6H Cherene Altes, MD   5 mg at 09/02/17 1201  . ondansetron (ZOFRAN) tablet 4 mg  4 mg Oral Q6H PRN Waldemar Dickens, MD       Or  . ondansetron [facility]) injection 4 mg  4 mg Intravenous Q6H PRN Waldemar Dickens, MD      . pantoprazole (PROTONIX) EC tablet 40 mg  40 mg Oral Q1200 Cherene Altes, MD         Discharge Medications: Please see discharge summary for a list of discharge medications.  Relevant Imaging Results:  Relevant Lab Results:   Additional Information SSN: [id number]; patient has cortrak  Estanislado Emms, LCSW

## 2017-09-02 NOTE — Progress Notes (Signed)
Subjective: No significant changes overnight  Exam: Vitals:   09/02/17 0558 09/02/17 0808  BP: 138/62 (!) 146/66  Pulse: 88 95  Resp: 13 15  Temp:  99.8 F (37.7 C)  SpO2: 100% 98%    HEENT-  Normocephalic, no lesions, without obvious abnormality.  Normal external eye and conjunctiva.  Normal TM's bilaterally.  Normal auditory canals and external ears. Normal external nose, mucus membranes and septum.  Normal pharynx. Cardiovascular- S1, S2 normal, pulses palpable throughout   Lungs- chest clear, no wheezing, rales, normal symmetric air entry Abdomen- normal findings: bowel sounds normal     Neuro: TF:TDDUKG are equal and round. They are symmetrically reactive from 3-->2 mm. EOMI without nystagmus. Facial sensation is intact to light touch. Face is symmetric at rest with normal strength and mobility. Hearing is intact to conversational voice.   Motor:Normal bulk, tone, and strength. 5/5 throughout.No drift.  Sensation: Intact to light touch.  DTRs:2+, symmetric  Toes downgoing bilaterally. No pathologic reflexes.  Coordination: Finger-to-nose and heel-to-shin are without dysmetria  Medications:  Scheduled: . aspirin  325 mg Oral Daily  . atorvastatin  10 mg Oral Daily  . heparin  5,000 Units Subcutaneous Q8H  . insulin aspart  0-5 Units Subcutaneous QHS  . insulin aspart  0-9 Units Subcutaneous TID WC  . lactulose  10 g Oral TID  . metoprolol tartrate  5 mg Intravenous Q6H  . pantoprazole  40 mg Oral Q1200   Continuous: . dextrose 5 % and 0.9 % NaCl with KCl 40 mEq/L 75 mL/hr at 09/01/17 1550  . levETIRAcetam Stopped (09/01/17 2215)   URK:[id number] **OR** acetaminophen, ibuprofen, LORazepam, ondansetron **OR** ondansetron (ZOFRAN) IV  Pertinent Labs/Diagnostics: LTM showed no epileptiform activity overnight Glucose 193 AST 17 ALT 7 Ammonia 67 and climbing since yesterday.  Yesterday ammonia was 60--Depakote today has been discontinued   No results  found.   Etta Quill PA-C [facility] [phone]  Impression: 76 year old female with a history of right parietal occipital hemorrhage, seizures, chronic kidney disease.  As stated prior her mental status initially started to improve over the treatment of UTI but since then has waxed and waned and over the recent days patient has remained somnolent and nonresponsive.  L TM has been obtained over the last 2 days L TM is not shown any epileptiform activity.  Patient's ammonia has been climbing over the last few days and Depakote has been stopped today.  Over the last few days patient's blood glucose has been unstable however overnight patient's glucose has ranged between 102 100.  Of note primary team has been trying to get nutrition into patient as this been an issue.  Husband has been refusing NG-tube.  If mentation does not improve primary team is considering palliative care consult.   Recommendations: 1) continue Keppra at 750 mg twice daily. 2) Depakote has been D/C'd 3) we will continue with LTM    09/02/2017, 9:27 AM

## 2017-09-02 NOTE — Progress Notes (Signed)
Patient with continued waxing and waning alertness. Unable to administer lactulose at beginning of shift due to risk for aspiration. Post oral care around 1am patient more alert. Was offered water and took a few sips; managed swallowing fine. Able to take lactulose at this time as well as about half a cup of water.   Patient turned q2hrs, vitals stable. NSR on tele. No episodes of seizures. Afebrile throughout evening. No BMs as of this evening.

## 2017-09-02 NOTE — Progress Notes (Addendum)
CSW noted placement of cortrak. CSW faxed out updated SNF referral to facilities that accept patients with cortrak, [facility] and [facility]. CSW discussed placement options with cortrak with husband. CSW to follow for disposition planning.  [name] [name], [place]

## 2017-09-03 LAB — BASIC METABOLIC PANEL
Anion gap: 8 (ref 5–15)
CALCIUM: 8.4 mg/dL — AB (ref 8.9–10.3)
CHLORIDE: 110 mmol/L (ref 101–111)
CO2: 26 mmol/L (ref 22–32)
CREATININE: 1.06 mg/dL — AB (ref 0.44–1.00)
GFR calc non Af Amer: 50 mL/min — ABNORMAL LOW (ref >60)
GFR, EST AFRICAN AMERICAN: 58 mL/min — AB (ref >60)
Glucose, Bld: 232 mg/dL — ABNORMAL HIGH (ref 65–99)
Potassium: 4.4 mmol/L (ref 3.5–5.1)
Sodium: 144 mmol/L (ref 135–145)

## 2017-09-03 LAB — MAGNESIUM: Magnesium: 1.5 mg/dL — ABNORMAL LOW (ref 1.7–2.4)

## 2017-09-03 LAB — PHOSPHORUS: PHOSPHORUS: 2.3 mg/dL — AB (ref 2.5–4.6)

## 2017-09-03 LAB — RPR: RPR Ser Ql: NONREACTIVE

## 2017-09-03 LAB — GLUCOSE, CAPILLARY
GLUCOSE-CAPILLARY: 238 mg/dL — AB (ref 65–99)
GLUCOSE-CAPILLARY: 244 mg/dL — AB (ref 65–99)
Glucose-Capillary: 305 mg/dL — ABNORMAL HIGH (ref 65–99)
Glucose-Capillary: 188 mg/dL — ABNORMAL HIGH (ref 65–99)

## 2017-09-03 NOTE — Progress Notes (Signed)
PROGRESS NOTE  [name] [name] DXA:[id number] DOB: [birth year] DOA: 08/26/2017 PCP: [name], MD  HPI/Recap of past 24 hours: Pt seen and examined with her husband at bedside. Patient opens her eyes but does not communicate. She does not appear to be in acute distress.  Assessment/Plan: Active Problems:   GERD (gastroesophageal reflux disease)   DM type 2 (diabetes mellitus, type 2) (HCC)   HTN (hypertension)   CKD (chronic kidney disease) stage 3, GFR 30-59 ml/min (HCC)   Acute encephalopathy   Seizure disorder, grand mal ([place])   UTI (urinary tract infection)   Sepsis, unspecified organism ([place])   Status epilepticus ([place])   S/P right hip fracture   Code Status: Full  Family Communication: With husband. All questions answered to his satisfaction.  Disposition Plan: Will stay another midnight to continue current treatment.   Consultants:  Neurology  Procedures:  Continuous video-EEG monitoring  Antimicrobials:  None  DVT prophylaxis:  Hep sq 5000 U TID   Objective: Vitals:   09/03/17 0053 09/03/17 0320 09/03/17 0421 09/03/17 0744  BP: (!) 141/56 135/70  (!) 152/53  Pulse: 98 97    Resp: 20 16    Temp: 98.7 F (37.1 C) 98.2 F (36.8 C)  99.4 F (37.4 C)  TempSrc: Oral   Axillary  SpO2: 96% 94%    Weight:   60.4 kg (133 lb 1.6 oz)   Height:        Intake/Output Summary (Last 24 hours) at 09/03/17 1214 Last data filed at 09/03/17 0600  Gross per 24 hour  Intake           1776.5 ml  Output                0 ml  Net           1776.5 ml   Filed Weights   09/01/17 0500 09/02/17 0416 09/03/17 0421  Weight: 58.5 kg (129 lb) 59.9 kg (132 lb) 60.4 kg (133 lb 1.6 oz)    Exam:  General: eyes open, does not follow commands No acute distress Neck:  Negative scars, lymphadenopathy, JVD Lungs: Clear to auscultation bilaterally without wheezes or crackles Cardiovascular: Regular rate and rhythm without murmur gallop or rub normal S1 and S2 Abdomen:  negative abdominal pain, nondistended, positive soft, bowel sounds, no rebound, no ascites, no appreciable mass Extremities: No significant cyanosis, clubbing, or edema bilateral lower extremities Skin: Negative rashes, lesions, ulcers Psychiatric: Unable to evaluate secondary to altered mental status Central nervous system: Unable to do a neurological exam as the pt does not follow any commands.   Data Reviewed: CBC:  Recent Labs Lab 08/28/17 0752 08/29/17 0408 08/30/17 0450 08/31/17 0446 09/01/17 0523 09/02/17 0306  WBC 23.0* 17.7* 9.7 8.7 11.5* 7.9  NEUTROABS 21.0*  --   --   --   --   --   HGB 11.7* 10.1* 9.7* 10.0* 10.3* 10.2*  HCT 36.4 32.4* 31.7* 32.5* 32.9* 33.4*  MCV 101.4* 101.9* 101.6* 100.0 100.3* 102.1*  PLT 151 176 170 170 228 209   Basic Metabolic Panel:  Recent Labs Lab 08/30/17 0450 08/31/17 0446 09/01/17 0523 09/02/17 0306 09/02/17 1433 09/02/17 1642 09/03/17 0333  NA 141 140 144 142  --   --  144  K 3.3* 3.3* 2.6* 4.3  --   --  4.4  CL 99* 101 103 105  --   --  110  CO2 32 28 32 32  --   --  26  GLUCOSE 204* 194* 48* 193*  --   --  232*  BUN 7 <5* <5* <5*  --   --  <5*  CREATININE 1.17* 1.15* 1.01* 1.00  --   --  1.06*  CALCIUM 8.2* 8.2* 8.6* 8.4*  --   --  8.4*  MG 1.0* 2.2  --  1.2* 1.9 1.7 1.5*  PHOS  --   --   --   --  2.1* 2.1* 2.3*   GFR: Estimated Creatinine Clearance: 39 mL/min (A) (by C-G formula based on SCr of 1.06 mg/dL (H)). Liver Function Tests:  Recent Labs Lab 08/28/17 0752 08/29/17 0408 09/02/17 0306  AST 29 21 17   ALT 13* 11* 7*  ALKPHOS 155* 130* 88  BILITOT 0.5 0.7 0.5  PROT 6.4* 6.0* 5.3*  ALBUMIN 2.3* 2.2* 1.8*   No results for input(s): LIPASE, AMYLASE in the last 168 hours.  Recent Labs Lab 08/28/17 0752 08/29/17 0408 09/01/17 1028 09/02/17 0306  AMMONIA 88* 36* 60* 67*   Coagulation Profile: No results for input(s): INR, PROTIME in the last 168 hours. Cardiac Enzymes: No results for input(s):  CKTOTAL, CKMB, CKMBINDEX, TROPONINI in the last 168 hours. BNP (last 3 results) No results for input(s): PROBNP in the last 8760 hours. HbA1C: No results for input(s): HGBA1C in the last 72 hours. CBG:  Recent Labs Lab 09/02/17 0808 09/02/17 1046 09/02/17 1613 09/02/17 2229 09/03/17 0742  GLUCAP 166* 106* 189* 228* 305*   Lipid Profile: No results for input(s): CHOL, HDL, LDLCALC, TRIG, CHOLHDL, LDLDIRECT in the last 72 hours. Thyroid Function Tests: No results for input(s): TSH, T4TOTAL, FREET4, T3FREE, THYROIDAB in the last 72 hours. Anemia Panel:  Recent Labs  09/02/17 1143  VITAMINB12 1,319*  FOLATE 30.0   Urine analysis:    Component Value Date/Time   COLORURINE YELLOW 08/26/2017 0345   APPEARANCEUR CLEAR 08/26/2017 0345   LABSPEC 1.015 08/26/2017 0345   PHURINE 6.0 08/26/2017 0345   GLUCOSEU 50 (A) 08/26/2017 0345   HGBUR NEGATIVE 08/26/2017 0345   BILIRUBINUR NEGATIVE 08/26/2017 0345   BILIRUBINUR neg 08/16/2012 1531   KETONESUR 5 (A) 08/26/2017 0345   PROTEINUR 30 (A) 08/26/2017 0345   UROBILINOGEN 0.2 06/25/2013 1002   NITRITE NEGATIVE 08/26/2017 0345   LEUKOCYTESUR TRACE (A) 08/26/2017 0345   Sepsis Labs: @LABRCNTIP (procalcitonin:4,lacticidven:4)  ) Recent Results (from the past 240 hour(s))  Blood Culture (routine x 2)     Status: None   Collection Time: 08/26/17  3:45 AM  Result Value Ref Range Status   Specimen Description BLOOD RIGHT ANTECUBITAL  Final   Special Requests   Final    BOTTLES DRAWN AEROBIC AND ANAEROBIC Blood Culture adequate volume   Culture NO GROWTH 5 DAYS  Final   Report Status 08/31/2017 FINAL  Final  Blood Culture (routine x 2)     Status: None   Collection Time: 08/26/17  4:16 AM  Result Value Ref Range Status   Specimen Description BLOOD LEFT HAND  Final   Special Requests IN PEDIATRIC BOTTLE Blood Culture adequate volume  Final   Culture NO GROWTH 5 DAYS  Final   Report Status 08/31/2017 FINAL  Final  MRSA PCR  Screening     Status: Abnormal   Collection Time: 08/27/17  3:47 AM  Result Value Ref Range Status   MRSA by PCR POSITIVE (A) NEGATIVE Final    Comment:        The GeneXpert MRSA Assay (FDA approved for NASAL specimens only), is one component  of a comprehensive MRSA colonization surveillance program. It is not intended to diagnose MRSA infection nor to guide or monitor treatment for MRSA infections. RESULT CALLED TO, READ BACK BY AND VERIFIED WITH: R.PIVERJER RN 08/27/17 0724 L.CHAMPION   Culture, Urine     Status: None   Collection Time: 08/27/17 10:47 AM  Result Value Ref Range Status   Specimen Description URINE, CATHETERIZED  Final   Special Requests NONE  Final   Culture NO GROWTH  Final   Report Status 08/28/2017 FINAL  Final  CSF culture with Stat gram stain     Status: None   Collection Time: 08/27/17  3:44 PM  Result Value Ref Range Status   Specimen Description CSF  Final   Special Requests Normal  Final   Gram Stain   Final    WBC PRESENT, PREDOMINANTLY MONONUCLEAR NO ORGANISMS SEEN CYTOSPIN SMEAR    Culture NO GROWTH 3 DAYS  Final   Report Status 08/30/2017 FINAL  Final      Studies: No results found.  Scheduled Meds: . aspirin  325 mg Oral Daily  . atorvastatin  10 mg Oral Daily  . chlorhexidine  15 mL Mouth Rinse BID  . heparin  5,000 Units Subcutaneous Q8H  . insulin aspart  0-5 Units Subcutaneous QHS  . insulin aspart  0-9 Units Subcutaneous TID WC  . lactulose  10 g Oral TID  . levETIRAcetam  500 mg Per Tube BID  . mouth rinse  15 mL Mouth Rinse q12n4p  . metoprolol tartrate  5 mg Intravenous Q6H  . pantoprazole sodium  40 mg Per Tube Daily    Continuous Infusions: . dextrose 5 % and 0.9 % NaCl with KCl 40 mEq/L 75 mL/hr at 09/02/17 2307  . feeding supplement (JEVITY 1.2 CAL) 1,000 mL (09/03/17 1100)     LOS: 8 days   Assessment and Plan:  Seizure disorder -Neurology following -Continuous EEG ongoing. -Keppra 500 mg BID via NG  tube  Sepsis with unclear source -afebrile -Completed 7 day of antibiotics -Cultures to date negative -Continue off antibiotics if patient positive leukocytosis or spikes fever will panculture  Encephalopathy, most likely multifactorial -hyperammonemia; on lactulose - LP 10/25 per Neuro unrevealing  -24 hour EEG in progress - if pt does not improve signif over the next 24-48hrs will consider consulting Palliative Care   Chronic Diastolic CHF --negative significant volume overload. -Strict in and out -Daily weight -Metoprolol  Essential HTN -See CHF    CAD -ASA, lipitor, lopressor  CKD stage III  -stable -Avoid nephrotoxic meds -Hydration       Hypokalemia -Potassium goal> 4 -Resolved Persists  Hypomagnesemia -Magnesium goal> 2 -Magnesium IV 2 g  RIGHT Hip Fracture -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 Ninfa Linden.  Diabetes Type 2 controlled with renal complication / Hypoglycemia  A1C= 6.5 (08/26/17) -suffered an episode of hypoglycemia during this hospitalization. Currently CBG well controlled  Severe protein calorie malnutrition in context of chronic illness -CorTrak tube placed 10/31. Tube feeds begun will slowly titrate up.    Kayleen Memos, MD [facility] Pager [phone]  If 7PM-7AM, please contact night-coverage [email or web address] Password [email or web address] 09/03/2017, 12:14 PM

## 2017-09-03 NOTE — Progress Notes (Signed)
Occupational Therapy Treatment Patient Details Name: [name] [name] MRN: [id number] DOB: Sep 09, 1941 Today's Date: 09/03/2017    History of present illness Patient is a 76 y/o female who presents with witnessed seizure and now post ictal. Pt on continuous EEG. PMH includes anemia, CKD, depression, GERD, HLD, HTN, SZR, DM, Hip fracture.   OT comments  Pt with PT for therapeutic activities. Pt more alert, maintaining eyes open throughout session. Improved ability to hold up her head and with facilitation her trunk posture improved. Followed one step commands inconsistently. Pt responded favorable to use of music to maintain alertness. Husband participating in session. Will continue to follow.  Follow Up Recommendations  SNF    Equipment Recommendations       Recommendations for Other Services      Precautions / Restrictions Precautions Precautions: Fall Precaution Comments: watch HR; continuous EEG Restrictions Weight Bearing Restrictions: No       Mobility Bed Mobility Overal bed mobility: Needs Assistance Bed Mobility: Rolling;Sidelying to Sit;Sit to Supine Rolling: +2 for physical assistance;Total assist Sidelying to sit: Total assist;+2 for physical assistance;HOB elevated   Sit to supine: Total assist;+2 for physical assistance;HOB elevated   General bed mobility comments: Total A for all bed mobility; rolling for pericare and to get into/out of bed.   Transfers Overall transfer level: Needs assistance Equipment used: 2 person hand held assist Transfers: Sit to/from Stand Sit to Stand: +2 physical assistance;Max assist         General transfer comment: Able to partially stand with assist of 2 with cues for hip/back extension and upright.     Balance Overall balance assessment: Needs assistance Sitting-balance support: Feet supported;No upper extremity supported Sitting balance-Leahy Scale: Poor Sitting balance - Comments: Requires Max A most of time to maintain  balance; demonstrates appropriate righting reactions towards left side and posteriorly on a few occasions inconsistently. Able to initiate thoracic and lumbar extension for upright posture with facilitation.   Standing balance support: During functional activity Standing balance-Leahy Scale: Zero Standing balance comment: Requires assist of 2 to partially stand                           ADL either performed or assessed with clinical judgement   ADL                                         General ADL Comments: continues to require total assist, pt unable to perform hand to face when requested     Vision       Perception     Praxis      Cognition Arousal/Alertness: Awake/alert Behavior During Therapy: Flat affect Overall Cognitive Status: Impaired/Different from baseline Area of Impairment: Attention;Following commands;Problem solving                 Orientation Level: Disoriented to;Place;Time;Situation Current Attention Level: Focused   Following Commands: Follows one step commands inconsistently;Follows one step commands with increased time (multimodal cues)     Problem Solving: Slow processing;Decreased initiation;Difficulty sequencing;Requires verbal cues;Requires tactile cues General Comments: no verbalizations, but appeared to nod x 1        Exercises General Exercises - Lower Extremity Long Arc Quad: PROM;10 reps;Seated   Shoulder Instructions       General Comments      Pertinent Vitals/ Pain  Pain Assessment: Faces Faces Pain Scale: Hurts little more Pain Location: grimacing  Pain Descriptors / Indicators: Grimacing Pain Intervention(s): Monitored during session;Repositioned;Limited activity within patient's tolerance  Home Living                                          Prior Functioning/Environment              Frequency  Min 2X/week        Progress Toward Goals  OT  Goals(current goals can now be found in the care plan section)  Progress towards OT goals: Progressing toward goals  Acute Rehab OT Goals Patient Stated Goal: to get her to wake up and come back home OT Goal Formulation: With family Time For Goal Achievement: 09/10/17 Potential to Achieve Goals: [place] Discharge plan remains appropriate    Co-evaluation    PT/OT/SLP Co-Evaluation/Treatment: Yes Reason for Co-Treatment: For patient/therapist safety;Complexity of the patient's impairments (multi-system involvement) PT goals addressed during session: Mobility/safety with mobility OT goals addressed during session: Strengthening/ROM      AM-PAC PT "6 Clicks" Daily Activity     Outcome Measure   Help from another person eating meals?: Total Help from another person taking care of personal grooming?: Total Help from another person toileting, which includes using toliet, bedpan, or urinal?: Total Help from another person bathing (including washing, rinsing, drying)?: Total Help from another person to put on and taking off regular upper body clothing?: Total Help from another person to put on and taking off regular lower body clothing?: Total 6 Click Score: 6    End of Session    OT Visit Diagnosis: Cognitive communication deficit (R41.841);Muscle weakness (generalized) (M62.81)   Activity Tolerance Patient tolerated treatment well   Patient Left in bed;with call bell/phone within reach;with family/visitor present;with bed alarm set   Nurse Communication  (needs pure wick replaced)        Time: 3818-2993 OT Time Calculation (min): 23 min  Charges: OT General Charges $OT Visit: 1 Visit OT Treatments $Therapeutic Activity: 8-22 mins  09/03/2017 Nestor Lewandowsky, OTR/L Pager: [phone]   Werner Lean, Haze Boyden 09/03/2017, 1:50 PM

## 2017-09-03 NOTE — Progress Notes (Signed)
Inpatient Diabetes Program Recommendations  AACE/ADA: New Consensus Statement on Inpatient Glycemic Control (2015)  Target Ranges:  Prepandial:   less than 140 mg/dL      Peak postprandial:   less than 180 mg/dL (1-2 hours)      Critically ill patients:  140 - 180 mg/dL   Results for [name] [name], [name] [name] (MRN [id number]) as of 09/03/2017 10:23  Ref. Range 09/02/2017 08:08 09/02/2017 10:46 09/02/2017 16:13 09/02/2017 22:29  Glucose-Capillary Latest Ref Range: 65 - 99 mg/dL 166 (H) 106 (H) 189 (H) 228 (H)   Results for [name] [name], [name] [name] (MRN [id number]) as of 09/03/2017 10:23  Ref. Range 09/03/2017 07:42  Glucose-Capillary Latest Ref Range: 65 - 99 mg/dL 305 (H)    Home DM Meds: Levemir 8 units QHS       Novolog 4-9 units TID per SSI  Current Insulin Orders: Novolog Sensitive Correction Scale/ SSI (0-9 units) TID AC + HS       MD- Note patient had Hypoglycemic event on the AM of 10/30.  Levemir was stopped at that time.  CBG this AM elevated to 305 mg/dl.  Please consider restarting Levemir insulin.  Could start with Levemir 4 units daily (50% home dose).      --Will follow patient during hospitalization--  Wyn Quaker RN, MSN, CDE Diabetes Coordinator Inpatient Glycemic Control Team Team Pager: [phone] (8a-5p)

## 2017-09-03 NOTE — Procedures (Signed)
LTM-EEG Report  HISTORY: Continuous video-EEG monitoring performed for 76year old with encephalopathy, fluctuant mental status.  ACQUISITION: International 10-20 system for electrode placement; 18 channels with additional eyes linked to ipsilateral ears and EKG. Additional T1-T2 electrodes were used. Continuous video recording obtained.   EEG NUMBER:  MEDICATIONS:  Day 1: VPA Day 2: LEV Day 3: LEV  DAY #1: from 095910/29/18 to 0730 09/01/17  BACKGROUND: An overall medium voltage continuous recording withsomespontaneous variability and reactivity. Waking background consisted of medium voltagetheta-delta activity bilaterally with sparse superimposed faster frequencies and no clear evidence of a posterior basicrhythm. State changes were captured with some sleep architecture at times (spindles).  EPILEPTIFORM/PERIODIC ACTIVITY: There were intermittent runs of generalized periodic discharges (GPDs), sharply contoured in morphology, frequency 1-1.5hz , usually present bilaterally but maximal in the left posterior head regions. These showed no clinical correlate and no ictal evolution. SEIZURES: none EVENTS: Several events of leg trembling were captured on video without EEG correlate  DAY #2: from 073010/30/18 to 0730 09/02/17  BACKGROUND: An overall medium voltage continuous recording withsomespontaneous variability and reactivity. Waking background consisted of medium voltagetheta-delta activity bilaterally with sparse superimposed faster frequencies and no clear evidence of a posterior basicrhythm. State changes were captured with some sleep architecture at times (spindles).  EPILEPTIFORM/PERIODIC ACTIVITY: There were intermittent runs of generalized periodic discharges (GPDs), sharply contoured in morphology, frequency 1-1.5hz , usually present bilaterally but maximal in the left posterior head regions. These showed no clinical correlate and no ictal evolution. These were unchanged  compared to the previous day. SEIZURES: none EVENTS: none  DAY #3: from 073010/31/18 to 0730 09/03/17  BACKGROUND: An overall medium voltage continuous recording withsomespontaneous variability and reactivity. Waking background consisted of medium voltagetheta-delta activity bilaterally with sparse superimposed faster frequencies and no clear evidence of a posterior basicrhythm. State changes were captured with some sleep architecture at times (spindles).  EPILEPTIFORM/PERIODIC ACTIVITY: There were intermittent runs of generalized periodic discharges (GPDs), sharply contoured in morphology, frequency 1-1.5hz , usually present bilaterally but maximal in the left posterior head regions. These were predominantly present with arousal and stimulation (SIRPIDs, indicating an encephalopathy pattern). These showed no clinical correlate and no ictal evolution. These were unchanged compared to the previous day. SEIZURES: none EVENTS: none  EKG: no significant arrhythmia  SUMMARY: This was an abnormal continuous video EEG due to diffuse slow activity with stimulus-induced periodic discharges bilaterally but asymmetric, maximal in the left posterior head regions. This was indicative of a severe toxic-metabolic encephalopathy pattern with superimposed focal cerebral disturbance over the right posterior regions (based on previous brain hemorrhage on that side). No seizure were seen. The recording was stable over the past 3 days.

## 2017-09-03 NOTE — Progress Notes (Signed)
Physical Therapy Treatment Patient Details Name: [name] [name] MRN: [id number] DOB: 08-05-1941 Today's Date: 09/03/2017    History of Present Illness Patient is a 76 y/o female who presents with witnessed seizure and now post ictal. Pt on continuous EEG. PMH includes anemia, CKD, depression, GERD, HLD, HTN, SZR, DM, Hip fracture.    PT Comments    Patient alert and awake today. Pt seen with OT, used music to help with engagement and participation. Pt tapping foot to music. Able to initiate thoracic/lumbar extension sitting EOB with cues but fatigues quickly. No verbalizations today. Able to follow a few simple 1 step commands but focused attention noted. Pt performed partial stand today with assist of 2. Will continue to follow.  Follow Up Recommendations  SNF;Supervision for mobility/OOB;Supervision/Assistance - 24 hour     Equipment Recommendations  Other (comment) (hospital bed if she goes home)    Recommendations for Other Services       Precautions / Restrictions Precautions Precautions: Fall Precaution Comments: watch HR; continuous EEG Restrictions Weight Bearing Restrictions: No    Mobility  Bed Mobility Overal bed mobility: Needs Assistance Bed Mobility: Rolling;Sidelying to Sit;Sit to Supine Rolling: +2 for physical assistance;Total assist Sidelying to sit: Total assist;+2 for physical assistance;HOB elevated   Sit to supine: Total assist;+2 for physical assistance;HOB elevated   General bed mobility comments: Total A for all bed mobility; rolling for pericare and to get into/out of bed.   Transfers Overall transfer level: Needs assistance Equipment used: 2 person hand held assist Transfers: Sit to/from Stand Sit to Stand: +2 physical assistance;Max assist         General transfer comment: Able to partially stand with assist of 2 with cues for hip/back extension and upright.   Ambulation/Gait             General Gait Details: Deferred   Stairs            Wheelchair Mobility    Modified Rankin (Stroke Patients Only)       Balance Overall balance assessment: Needs assistance Sitting-balance support: Feet supported;No upper extremity supported Sitting balance-Leahy Scale: Poor Sitting balance - Comments: Requires Max A most of time to maintain balance; demonstrates appropriate righting reactions towards left side and posteriorly on a few occasions inconsistently. Able to initiate thoracic and lumbar extension for upright posture.   Standing balance support: During functional activity Standing balance-Leahy Scale: Zero Standing balance comment: Requires assist of 2 to partially stand                            Cognition Arousal/Alertness: Awake/alert Behavior During Therapy: Flat affect Overall Cognitive Status: Impaired/Different from baseline                   Orientation Level: Disoriented to;Place;Time;Situation Current Attention Level: Focused   Following Commands: Follows one step commands inconsistently (with increased time and multimodal cues)     Problem Solving: Slow processing;Decreased initiation;Difficulty sequencing;Requires verbal cues;Requires tactile cues General Comments: Follows 1 step commands inconsistently ~<20% of the time. Able to initiate some movements with increased time and cues. Able to tap to the music playing. No verbalizations today.      Exercises General Exercises - Lower Extremity Long Arc Quad: PROM;10 reps;Seated    General Comments        Pertinent Vitals/Pain Pain Assessment: Faces Faces Pain Scale: Hurts little more Pain Location: grimacing  Pain Descriptors / Indicators: Grimacing Pain Intervention(s):  Monitored during session;Repositioned;Limited activity within patient's tolerance    Home Living                      Prior Function            PT Goals (current goals can now be found in the care plan section) Progress towards PT goals:  Progressing toward goals (slowly)    Frequency    Min 2X/week      PT Plan Current plan remains appropriate    Co-evaluation PT/OT/SLP Co-Evaluation/Treatment: Yes Reason for Co-Treatment: For patient/therapist safety;Necessary to address cognition/behavior during functional activity PT goals addressed during session: Mobility/safety with mobility        AM-PAC PT "6 Clicks" Daily Activity  Outcome Measure  Difficulty turning over in bed (including adjusting bedclothes, sheets and blankets)?: Unable Difficulty moving from lying on back to sitting on the side of the bed? : Unable Difficulty sitting down on and standing up from a chair with arms (e.g., wheelchair, bedside commode, etc,.)?: Unable Help needed moving to and from a bed to chair (including a wheelchair)?: Total Help needed walking in hospital room?: Total Help needed climbing 3-5 steps with a railing? : Total 6 Click Score: 6    End of Session Equipment Utilized During Treatment: Other (comment) (continuous EEG) Activity Tolerance: Patient tolerated treatment well Patient left: in bed;with call bell/phone within reach;with family/visitor present;with bed alarm set Nurse Communication: Mobility status;Need for lift equipment PT Visit Diagnosis: Muscle weakness (generalized) (M62.81);Other symptoms and signs involving the nervous system (R29.898)     Time: 1610-9604 PT Time Calculation (min) (ACUTE ONLY): 23 min  Charges:  $Therapeutic Activity: 8-22 mins                    G Codes:       Wray Kearns, PT, DPT [phone]     [name] [name] 09/03/2017, 1:36 PM

## 2017-09-03 NOTE — Progress Notes (Signed)
Subjective: Patient currently very lethargic in bed.  Opens eyes to voice.  Does not follow commands.  Blinks to threat.  Looks around the room and tracks me in the room--is only brief when I keep her stimulated otherwise she closes her eyes and falls asleep.  Currently she does have an NG tube placed.  Patient is still linked to long-term monitoring for EEG.  Over the last few days no epileptiform activity has been noted.  She is doing well on Keppra with no side effects.  Exam: Vitals:   09/03/17 0320 09/03/17 0744  BP: 135/70 (!) 152/53  Pulse: 97   Resp: 16   Temp: 98.2 F (36.8 C) 99.4 F (37.4 C)  SpO2: 94%     HEENT-  Normocephalic, no lesions, without obvious abnormality.  Normal external eye and conjunctiva.  Normal TM's bilaterally.  Normal auditory canals and external ears. Normal external nose, mucus membranes and septum.  Normal pharynx. Cardiovascular- S1, S2 normal, pulses palpable throughout   Lungs- chest clear, no wheezing, rales, normal symmetric air entry Abdomen- normal findings: bowel sounds normal    Neuro:  CN: Pupils are equal and round. They are symmetrically reactive from 3-->2 mm. EOMI without nystagmus. Facial sensation is intact to light touch. Face is symmetric at rest with normal strength and mobility. Hearing is intact .  Voice-non-vocal. Bilateral SCM and trapezii are 5/5. Tongue is midline with normal bulk and mobility.  Motor:  Flaccid throughout Sensation: Intact to light touch.  DTRs: 1+, symmetric  Toes downgoing bilaterally. No pathologic reflexes.    Medications:  Scheduled: . aspirin  325 mg Oral Daily  . atorvastatin  10 mg Oral Daily  . chlorhexidine  15 mL Mouth Rinse BID  . heparin  5,000 Units Subcutaneous Q8H  . insulin aspart  0-5 Units Subcutaneous QHS  . insulin aspart  0-9 Units Subcutaneous TID WC  . lactulose  10 g Oral TID  . levETIRAcetam  500 mg Per Tube BID  . mouth rinse  15 mL Mouth Rinse q12n4p  . metoprolol tartrate   5 mg Intravenous Q6H  . pantoprazole sodium  40 mg Per Tube Daily   Continuous: . dextrose 5 % and 0.9 % NaCl with KCl 40 mEq/L 75 mL/hr at 09/02/17 2307  . feeding supplement (JEVITY 1.2 CAL) 1,000 mL (09/03/17 0323)   ZJQ:BHALPFXTKWIOX (TYLENOL) oral liquid 160 mg/5 mL **OR** acetaminophen, ibuprofen, LORazepam, ondansetron **OR** ondansetron (ZOFRAN) IV  Pertinent Labs/Diagnostics: LTM reading for today is pending.  Previous L TM readings have shown no epileptiform activity Glucose again very unstable.  2 days ago 48, yesterday at 63, today 42.   Etta Quill PA-C [facility] [phone]  Impression: 76 year old female with a history of right parietal occipital hemorrhage, seizures, chronic kidney disease.  As stated prior her mental status initially started to improve over the treatment of UTI but since then has waxed and waned and over the recent days patient has remained somnolent and nonresponsive.  L TM has been obtained over the last 2 days L TM is not shown any epileptiform activity.  Patient's ammonia has been climbing over the last few days and Depakote has been stopped today.    At this point patient does have an NG tube placed.  Per primary note if patient does not improve in the next 24 hours consideration of palliative care will be made.  I have discussed with Dr. [place] Crane at this time we have no further recommendations.  She will contact  us if she needs [place] for any further recommendations.  Likely will discontinue LTM today.  At this time continue Keppra at current dose.  No further recommendations.  If okay with primary team will sign off.  Awaiting for primary team to contact us      09/03/2017, 9:05 AM

## 2017-09-03 NOTE — Progress Notes (Signed)
LTM EEG completed, results pending.

## 2017-09-04 LAB — GLUCOSE, CAPILLARY
GLUCOSE-CAPILLARY: 328 mg/dL — AB (ref 65–99)
GLUCOSE-CAPILLARY: 372 mg/dL — AB (ref 65–99)
Glucose-Capillary: 305 mg/dL — ABNORMAL HIGH (ref 65–99)
Glucose-Capillary: 110 mg/dL — ABNORMAL HIGH (ref 65–99)
Glucose-Capillary: 280 mg/dL — ABNORMAL HIGH (ref 65–99)
Glucose-Capillary: 236 mg/dL — ABNORMAL HIGH (ref 65–99)

## 2017-09-04 LAB — PHOSPHORUS: Phosphorus: 2.2 mg/dL — ABNORMAL LOW (ref 2.5–4.6)

## 2017-09-04 LAB — BASIC METABOLIC PANEL
ANION GAP: 11 (ref 5–15)
BUN: 8 mg/dL (ref 6–20)
CALCIUM: 8.2 mg/dL — AB (ref 8.9–10.3)
CO2: 24 mmol/L (ref 22–32)
CREATININE: 1.21 mg/dL — AB (ref 0.44–1.00)
Chloride: 108 mmol/L (ref 101–111)
GFR calc Af Amer: 49 mL/min — ABNORMAL LOW (ref >60)
GFR, EST NON AFRICAN AMERICAN: 42 mL/min — AB (ref >60)
GLUCOSE: 421 mg/dL — AB (ref 65–99)
Potassium: 4.9 mmol/L (ref 3.5–5.1)
Sodium: 143 mmol/L (ref 135–145)

## 2017-09-04 LAB — CBC
HCT: 29.9 % — ABNORMAL LOW (ref 36.0–46.0)
Hemoglobin: 9.1 g/dL — ABNORMAL LOW (ref 12.0–15.0)
MCH: 31.4 pg (ref 26.0–34.0)
MCHC: 30.4 g/dL (ref 30.0–36.0)
MCV: 103.1 fL — ABNORMAL HIGH (ref 78.0–100.0)
Platelets: 370 10*3/uL (ref 150–400)
RBC: 2.9 MIL/uL — ABNORMAL LOW (ref 3.87–5.11)
RDW: 16.3 % — ABNORMAL HIGH (ref 11.5–15.5)
WBC: 17.6 10*3/uL — AB (ref 4.0–10.5)

## 2017-09-04 LAB — MAGNESIUM: Magnesium: 1.3 mg/dL — ABNORMAL LOW (ref 1.7–2.4)

## 2017-09-04 LAB — AMMONIA: Ammonia: 44 umol/L — ABNORMAL HIGH (ref 9–35)

## 2017-09-04 MED ORDER — ONDANSETRON HCL 4 MG PO TABS: 4.0000 mg | ORAL_TABLET | Freq: Four times a day (QID) | ORAL | Status: DC | PRN

## 2017-09-04 MED ORDER — INSULIN ASPART 100 UNIT/ML ~~LOC~~ SOLN
0.0000 [IU] | SUBCUTANEOUS | Status: DC
  Administered 2017-09-04: 3 [IU] via SUBCUTANEOUS
  Administered 2017-09-04: 5 [IU] via SUBCUTANEOUS
  Administered 2017-09-04: 6 [IU] via SUBCUTANEOUS
  Administered 2017-09-04: 7 [IU] via SUBCUTANEOUS
  Administered 2017-09-05: 3 [IU] via SUBCUTANEOUS
  Administered 2017-09-05: 5 [IU] via SUBCUTANEOUS
  Administered 2017-09-05: 3 [IU] via SUBCUTANEOUS
  Administered 2017-09-05: 7 [IU] via SUBCUTANEOUS
  Administered 2017-09-05 (×2): 5 [IU] via SUBCUTANEOUS
  Administered 2017-09-06: 2 [IU] via SUBCUTANEOUS
  Administered 2017-09-06: 5 [IU] via SUBCUTANEOUS
  Administered 2017-09-06: 2 [IU] via SUBCUTANEOUS
  Administered 2017-09-06: 5 [IU] via SUBCUTANEOUS
  Administered 2017-09-06: 2 [IU] via SUBCUTANEOUS
  Administered 2017-09-07 (×2): 3 [IU] via SUBCUTANEOUS
  Administered 2017-09-07: 5 [IU] via SUBCUTANEOUS

## 2017-09-04 MED ORDER — ASPIRIN 325 MG PO TABS
325.0000 mg | ORAL_TABLET | Freq: Every day | ORAL | Status: DC
  Administered 2017-09-05 – 2017-09-10 (×5): 325 mg
  Filled 2017-09-04 (×6): qty 1

## 2017-09-04 MED ORDER — ATORVASTATIN CALCIUM 10 MG PO TABS
10.0000 mg | ORAL_TABLET | Freq: Every day | ORAL | Status: DC
  Administered 2017-09-05 – 2017-09-10 (×5): 10 mg
  Filled 2017-09-04 (×6): qty 1

## 2017-09-04 MED ORDER — IBUPROFEN 200 MG PO TABS
400.0000 mg | ORAL_TABLET | Freq: Four times a day (QID) | ORAL | Status: DC | PRN
  Administered 2017-09-06: 400 mg
  Filled 2017-09-04: qty 2

## 2017-09-04 MED ORDER — LACTULOSE 10 GM/15ML PO SOLN
10.0000 g | Freq: Three times a day (TID) | ORAL | Status: DC
  Administered 2017-09-04 – 2017-09-07 (×5): 10 g
  Filled 2017-09-04 (×6): qty 15

## 2017-09-04 MED ORDER — ONDANSETRON HCL 4 MG/2ML IJ SOLN: 4.0000 mg | Freq: Four times a day (QID) | INTRAMUSCULAR | Status: DC | PRN

## 2017-09-04 MED ORDER — POTASSIUM CHLORIDE IN NACL 40-0.9 MEQ/L-% IV SOLN
INTRAVENOUS | Status: DC
  Administered 2017-09-04 – 2017-09-06 (×4): 75 mL/h via INTRAVENOUS
  Filled 2017-09-04 (×6): qty 1000

## 2017-09-04 NOTE — Progress Notes (Addendum)
  Speech Language Pathology Treatment: Dysphagia  Patient Details Name: [name] [name] MRN: [id number] DOB: Mar 24, 1941 Today's Date: 09/04/2017 Time: 7989-2119 SLP Time Calculation (min) (ACUTE ONLY): 19 min  Assessment / Plan / Recommendation Clinical Impression  Since previous ST session pt received a Cortrak feeding tube due to inability (cognitive) to achieve adequate po consumption. She continues to exhibit a primarily cognitive based swallowing impairment. Pt looked at SLP, smiled and did not follow any commands. Unable to get toothbrush past dentition during oral care and was not accepting of pudding. She received one small sip water with suspected premature spill (no cough). Educated pt's husband throughout session. Recommend she continue nutrition via NGT with ST continuing to try po's. Noted Palliative care may be consulted if no improvements.    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 Pt was also septic, likely due to UTI. Pt has had multiple swallowing evaluations in the past, although most recent recommendations have been for regular diet textures and thin liquids.       SLP Plan  Continue with current plan of care       Recommendations  Diet recommendations: NPO Medication Administration: Via alternative means                Oral Care Recommendations: Oral care BID Follow up Recommendations: Skilled Nursing facility SLP Visit Diagnosis: Dysphagia, unspecified (R13.10);Cognitive communication deficit (E17.408) Plan: Continue with current plan of care       GO                [name] 09/04/2017, 3:09 PM  [name].Ed [employer] [phone]

## 2017-09-04 NOTE — Plan of Care (Signed)
Problem: Nutrition: Goal: Adequate nutrition will be maintained Outcome: Progressing TF Jevity infusing at 50 ml/hr, goal rate for patient

## 2017-09-04 NOTE — Progress Notes (Addendum)
PROGRESS NOTE  [name] [name] IFO:[id number] DOB: 19-Jan-1941 DOA: 08/26/2017 PCP: [name], MD  HPI/Recap of past 24 hours: [name] [name] was seen and examined at her bedside. No family members at her side. Very tender upon palpation of her upper extremities bilaterally. Non verbal therefore unable to obtain a review of systems.  Assessment/Plan: Active Problems:   GERD (gastroesophageal reflux disease)   DM type 2 (diabetes mellitus, type 2) (HCC)   HTN (hypertension)   CKD (chronic kidney disease) stage 3, GFR 30-59 ml/min (HCC)   Acute encephalopathy   Seizure disorder, grand mal ([place])   UTI (urinary tract infection)   Sepsis, unspecified organism ([place])   Status epilepticus ([place])   S/P right hip fracture  Code Status: Full  Family Communication: No family members at her bedside.   Disposition Plan: Will stay another midnight to continue current treatment.   Consultants:  Neurology  Procedures:  Continuous video-EEG monitoring  Antimicrobials:  None   DVT prophylaxis:  Hep sq 5000 U TID    Objective: Vitals:   09/03/17 2002 09/04/17 0014 09/04/17 0336 09/04/17 0700  BP: 134/82 (!) 172/96 (!) 149/57   Pulse: (!) 108 (!) 127 97   Resp: 16 15 18    Temp: 98.3 F (36.8 C) 99.3 F (37.4 C) 99.9 F (37.7 C) 98.9 F (37.2 C)  TempSrc: Oral Axillary Axillary Axillary  SpO2: 97% 95% 95%   Weight:   59.9 kg (132 lb)   Height:        Intake/Output Summary (Last 24 hours) at 09/04/17 0842 Last data filed at 09/04/17 0700  Gross per 24 hour  Intake          3028.74 ml  Output             1700 ml  Net          1328.74 ml   Filed Weights   09/02/17 0416 09/03/17 0421 09/04/17 0336  Weight: 59.9 kg (132 lb) 60.4 kg (133 lb 1.6 oz) 59.9 kg (132 lb)    Exam:  General:eyes open, but minimally follow commands. No acute distress Neck: Negative scars, lymphadenopathy, JVD Lungs:Clear to auscultation bilaterally without wheezes or  crackles Cardiovascular:Regular rate and rhythm without murmur gallop or rub normal S1 and S2 Abdomen: negative abdominal pain, nondistended, positive soft, bowel sounds, no rebound, no ascites, no appreciable mass Extremities:No significant cyanosis, clubbing, or edema bilateral lower extremities Skin:Negative rashes, lesions, ulcers Psychiatric:Unable to evaluate secondary to altered mental status Central nervous system: Unable to do a neurological exam as the pt does not follow any commands.  Data Reviewed: CBC:  Recent Labs Lab 08/29/17 0408 08/30/17 0450 08/31/17 0446 09/01/17 0523 09/02/17 0306  WBC 17.7* 9.7 8.7 11.5* 7.9  HGB 10.1* 9.7* 10.0* 10.3* 10.2*  HCT 32.4* 31.7* 32.5* 32.9* 33.4*  MCV 101.9* 101.6* 100.0 100.3* 102.1*  PLT 176 170 170 228 676   Basic Metabolic Panel:  Recent Labs Lab 08/31/17 0446 09/01/17 0523 09/02/17 0306 09/02/17 1433 09/02/17 1642 09/03/17 0333 09/04/17 0405  NA 140 144 142  --   --  144 143  K 3.3* 2.6* 4.3  --   --  4.4 4.9  CL 101 103 105  --   --  110 108  CO2 28 32 32  --   --  26 24  GLUCOSE 194* 48* 193*  --   --  232* 421*  BUN <5* <5* <5*  --   --  <5* 8  CREATININE  1.15* 1.01* 1.00  --   --  1.06* 1.21*  CALCIUM 8.2* 8.6* 8.4*  --   --  8.4* 8.2*  MG 2.2  --  1.2* 1.9 1.7 1.5* 1.3*  PHOS  --   --   --  2.1* 2.1* 2.3* 2.2*   GFR: Estimated Creatinine Clearance: 34.2 mL/min (A) (by C-G formula based on SCr of 1.21 mg/dL (H)). Liver Function Tests:  Recent Labs Lab 08/29/17 0408 09/02/17 0306  AST 21 17  ALT 11* 7*  ALKPHOS 130* 88  BILITOT 0.7 0.5  PROT 6.0* 5.3*  ALBUMIN 2.2* 1.8*   No results for input(s): LIPASE, AMYLASE in the last 168 hours.  Recent Labs Lab 08/29/17 0408 09/01/17 1028 09/02/17 0306  AMMONIA 36* 60* 67*   Coagulation Profile: No results for input(s): INR, PROTIME in the last 168 hours. Cardiac Enzymes: No results for input(s): CKTOTAL, CKMB, CKMBINDEX, TROPONINI in the  last 168 hours. BNP (last 3 results) No results for input(s): PROBNP in the last 8760 hours. HbA1C: No results for input(s): HGBA1C in the last 72 hours. CBG:  Recent Labs Lab 09/03/17 1631 09/03/17 2009 09/04/17 0013 09/04/17 0345 09/04/17 0755  GLUCAP 238* 244* 328* 372* 305*   Lipid Profile: No results for input(s): CHOL, HDL, LDLCALC, TRIG, CHOLHDL, LDLDIRECT in the last 72 hours. Thyroid Function Tests: No results for input(s): TSH, T4TOTAL, FREET4, T3FREE, THYROIDAB in the last 72 hours. Anemia Panel:  Recent Labs  09/02/17 1143  VITAMINB12 1,319*  FOLATE 30.0   Urine analysis:    Component Value Date/Time   COLORURINE YELLOW 08/26/2017 0345   APPEARANCEUR CLEAR 08/26/2017 0345   LABSPEC 1.015 08/26/2017 0345   PHURINE 6.0 08/26/2017 0345   GLUCOSEU 50 (A) 08/26/2017 0345   HGBUR NEGATIVE 08/26/2017 0345   BILIRUBINUR NEGATIVE 08/26/2017 0345   BILIRUBINUR neg 08/16/2012 1531   KETONESUR 5 (A) 08/26/2017 0345   PROTEINUR 30 (A) 08/26/2017 0345   UROBILINOGEN 0.2 06/25/2013 1002   NITRITE NEGATIVE 08/26/2017 0345   LEUKOCYTESUR TRACE (A) 08/26/2017 0345   Sepsis Labs: @LABRCNTIP (procalcitonin:4,lacticidven:4)  ) Recent Results (from the past 240 hour(s))  Blood Culture (routine x 2)     Status: None   Collection Time: 08/26/17  3:45 AM  Result Value Ref Range Status   Specimen Description BLOOD RIGHT ANTECUBITAL  Final   Special Requests   Final    BOTTLES DRAWN AEROBIC AND ANAEROBIC Blood Culture adequate volume   Culture NO GROWTH 5 DAYS  Final   Report Status 08/31/2017 FINAL  Final  Blood Culture (routine x 2)     Status: None   Collection Time: 08/26/17  4:16 AM  Result Value Ref Range Status   Specimen Description BLOOD LEFT HAND  Final   Special Requests IN PEDIATRIC BOTTLE Blood Culture adequate volume  Final   Culture NO GROWTH 5 DAYS  Final   Report Status 08/31/2017 FINAL  Final  MRSA PCR Screening     Status: Abnormal   Collection  Time: 08/27/17  3:47 AM  Result Value Ref Range Status   MRSA by PCR POSITIVE (A) NEGATIVE Final    Comment:        The GeneXpert MRSA Assay (FDA approved for NASAL specimens only), is one component of a comprehensive MRSA colonization surveillance program. It is not intended to diagnose MRSA infection nor to guide or monitor treatment for MRSA infections. RESULT CALLED TO, READ BACK BY AND VERIFIED WITH: R.PIVERJER RN 08/27/17 0724 L.CHAMPION  Culture, Urine     Status: None   Collection Time: 08/27/17 10:47 AM  Result Value Ref Range Status   Specimen Description URINE, CATHETERIZED  Final   Special Requests NONE  Final   Culture NO GROWTH  Final   Report Status 08/28/2017 FINAL  Final  CSF culture with Stat gram stain     Status: None   Collection Time: 08/27/17  3:44 PM  Result Value Ref Range Status   Specimen Description CSF  Final   Special Requests Normal  Final   Gram Stain   Final    WBC PRESENT, PREDOMINANTLY MONONUCLEAR NO ORGANISMS SEEN CYTOSPIN SMEAR    Culture NO GROWTH 3 DAYS  Final   Report Status 08/30/2017 FINAL  Final      Studies: No results found.  Scheduled Meds: . aspirin  325 mg Oral Daily  . atorvastatin  10 mg Oral Daily  . chlorhexidine  15 mL Mouth Rinse BID  . heparin  5,000 Units Subcutaneous Q8H  . insulin aspart  0-9 Units Subcutaneous Q4H  . lactulose  10 g Oral TID  . levETIRAcetam  500 mg Per Tube BID  . mouth rinse  15 mL Mouth Rinse q12n4p  . metoprolol tartrate  5 mg Intravenous Q6H  . pantoprazole sodium  40 mg Per Tube Daily    Continuous Infusions: . 0.9 % NaCl with KCl 40 mEq / L 75 mL/hr (09/04/17 0503)  . feeding supplement (JEVITY 1.2 CAL) 1,000 mL (09/03/17 1915)     LOS: 9 days   Assessment and Plan:  Seizure disorder -Neurology following -Continuous EEG ongoing. -Keppra 500 mg BID via NG tube  SIRS with no clear source of infection -wbc 17k Hr 103 -afebrile -Completed 7 day of  antibiotics -Cultures to date negative -Reculture -Procalcitonin, lactic acid -Repeat labs in the am.  Encephalopathy, most likely multifactorial -hyperammonemia; on lactulose - LP 10/25 per Neuro unrevealing  -24 hour EEG in progress - consider consulting Palliative Care   Chronic Diastolic CHF --negative significant volume overload. 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 Ninfa Linden.  Diabetes Type 2 controlled with renal complication / Hypoglycemia A1C=6.5 (08/26/17) -suffered an episode of hypoglycemia during this hospitalization. Currently CBG well controlled  Severe protein calorie malnutrition in context of chronic illness -CorTrak tube placed 10/31. Tube feeds begun will slowly titrate up.   Kayleen Memos, MD [facility] Pager [phone]  If 7PM-7AM, please contact night-coverage [email or web address] Password [facility] 09/04/2017, 8:42 AM

## 2017-09-04 NOTE — Progress Notes (Signed)
Inpatient Diabetes Program Recommendations  AACE/ADA: New Consensus Statement on Inpatient Glycemic Control (2015)  Target Ranges:  Prepandial:   less than 140 mg/dL      Peak postprandial:   less than 180 mg/dL (1-2 hours)      Critically ill patients:  140 - 180 mg/dL   Lab Results  Component Value Date   GLUCAP 305 (H) 09/04/2017   HGBA1C 6.5 (H) 08/26/2017    Review of Glycemic ControlResults for [name] [name], [name] [name] (MRN [id number]) as of 09/04/2017 09:56  Ref. Range 09/03/2017 16:31 09/03/2017 20:09 09/04/2017 00:13 09/04/2017 03:45 09/04/2017 07:55  Glucose-Capillary Latest Ref Range: 65 - 99 mg/dL 238 (H) 244 (H) 328 (H) 372 (H) 305 (H)   Diabetes history: Type 2 DM Outpatient Diabetes medications: Novolog flexpen 4-9 units tid with meals, Levemir 8 units q HS Current orders for Inpatient glycemic control: Novolog sensitive q 4 hours   Inpatient Diabetes Program Recommendations:    Consider adding Levemir 4 units bid while patient in on tube feeds.   Thanks, Adah Perl, RN, BC-ADM Inpatient Diabetes Coordinator Pager [phone] (8a-5p)

## 2017-09-04 NOTE — Progress Notes (Signed)
CSW continuing to follow for disposition plan. Faxed out for SNFs. Note possible plan for palliative depending on improvement. CSW to follow.  [name] [name], [place]

## 2017-09-04 NOTE — Care Management Note (Signed)
Case Management Note  Patient Details  Name: [name] [name] MRN: [id number] Date of Birth: [birth year]  Subjective/Objective:  Pt presented for AMS and Seizure- hx of Seizures and CKD. Continuous EEG- IV Keppra changed via the Cortrak. Pt is from home with Support of Husband. PT/OT recommendations for SNF. Pt will benefit from Palliative Care Consult: Goals of Care - Question need of Peg Placement.               Action/Plan: CSW and CM will continue to monitor for disposition needs.   Expected Discharge Date:  08/31/17               Expected Discharge Plan:  Skilled Nursing Facility  In-House Referral:  Clinical Social Work  Discharge planning Services  CM Consult  Post Acute Care Choice:    Choice offered to:     DME Arranged:    DME Agency:     HH Arranged:    [place] Agency:     Status of Service:  In process, will continue to follow  If discussed at Long Length of Stay Meetings, dates discussed:    Additional Comments:  Bethena Roys, RN 09/04/2017, 12:16 PM

## 2017-09-05 ENCOUNTER — Inpatient Hospital Stay (HOSPITAL_COMMUNITY): Payer: Medicare Other

## 2017-09-05 DIAGNOSIS — L080 Pyoderma: Secondary | ICD-10-CM

## 2017-09-05 DIAGNOSIS — T8149XA Infection following a procedure, other surgical site, initial encounter: Secondary | ICD-10-CM

## 2017-09-05 LAB — PROCALCITONIN: Procalcitonin: 0.22 ng/mL

## 2017-09-05 LAB — LACTIC ACID, PLASMA
LACTIC ACID, VENOUS: 1.7 mmol/L (ref 0.5–1.9)
Lactic Acid, Venous: 1.6 mmol/L (ref 0.5–1.9)

## 2017-09-05 LAB — GLUCOSE, CAPILLARY
GLUCOSE-CAPILLARY: 249 mg/dL — AB (ref 65–99)
GLUCOSE-CAPILLARY: 266 mg/dL — AB (ref 65–99)
GLUCOSE-CAPILLARY: 316 mg/dL — AB (ref 65–99)
Glucose-Capillary: 253 mg/dL — ABNORMAL HIGH (ref 65–99)
Glucose-Capillary: 265 mg/dL — ABNORMAL HIGH (ref 65–99)
Glucose-Capillary: 280 mg/dL — ABNORMAL HIGH (ref 65–99)
Glucose-Capillary: 290 mg/dL — ABNORMAL HIGH (ref 65–99)

## 2017-09-05 LAB — CBC
HEMATOCRIT: 29 % — AB (ref 36.0–46.0)
HEMOGLOBIN: 8.7 g/dL — AB (ref 12.0–15.0)
MCH: 30.9 pg (ref 26.0–34.0)
MCHC: 30 g/dL (ref 30.0–36.0)
MCV: 102.8 fL — AB (ref 78.0–100.0)
Platelets: 392 10*3/uL (ref 150–400)
RBC: 2.82 MIL/uL — ABNORMAL LOW (ref 3.87–5.11)
RDW: 15.7 % — ABNORMAL HIGH (ref 11.5–15.5)
WBC: 18.4 10*3/uL — AB (ref 4.0–10.5)

## 2017-09-05 LAB — BASIC METABOLIC PANEL
ANION GAP: 7 (ref 5–15)
BUN: 8 mg/dL (ref 6–20)
CHLORIDE: 107 mmol/L (ref 101–111)
CO2: 28 mmol/L (ref 22–32)
Calcium: 8.3 mg/dL — ABNORMAL LOW (ref 8.9–10.3)
Creatinine, Ser: 1.1 mg/dL — ABNORMAL HIGH (ref 0.44–1.00)
GFR calc non Af Amer: 48 mL/min — ABNORMAL LOW (ref >60)
GFR, EST AFRICAN AMERICAN: 55 mL/min — AB (ref >60)
GLUCOSE: 312 mg/dL — AB (ref 65–99)
Potassium: 5 mmol/L (ref 3.5–5.1)
Sodium: 142 mmol/L (ref 135–145)

## 2017-09-05 LAB — MAGNESIUM: Magnesium: 1 mg/dL — ABNORMAL LOW (ref 1.7–2.4)

## 2017-09-05 LAB — PHOSPHORUS: PHOSPHORUS: 2.4 mg/dL — AB (ref 2.5–4.6)

## 2017-09-05 LAB — AMMONIA: AMMONIA: 25 umol/L (ref 9–35)

## 2017-09-05 MED ORDER — VANCOMYCIN HCL 500 MG IV SOLR: 500.0000 mg | Freq: Three times a day (TID) | INTRAVENOUS | Status: DC

## 2017-09-05 MED ORDER — SODIUM CHLORIDE 0.9% FLUSH: 10.0000 mL | INTRAVENOUS | Status: DC | PRN

## 2017-09-05 MED ORDER — MAGNESIUM SULFATE 2 GM/50ML IV SOLN
2.0000 g | Freq: Once | INTRAVENOUS | Status: AC
  Administered 2017-09-05: 2 g via INTRAVENOUS
  Filled 2017-09-05: qty 50

## 2017-09-05 MED ORDER — VANCOMYCIN HCL 10 G IV SOLR
1200.0000 mg | Freq: Once | INTRAVENOUS | Status: DC
  Filled 2017-09-05: qty 1200

## 2017-09-05 MED ORDER — VANCOMYCIN HCL 10 G IV SOLR
1250.0000 mg | Freq: Once | INTRAVENOUS | Status: AC
  Administered 2017-09-05: 1250 mg via INTRAVENOUS
  Filled 2017-09-05: qty 1250

## 2017-09-05 MED ORDER — PIPERACILLIN-TAZOBACTAM 3.375 G IVPB
3.3750 g | Freq: Three times a day (TID) | INTRAVENOUS | Status: DC
  Administered 2017-09-05 – 2017-09-09 (×12): 3.375 g via INTRAVENOUS
  Filled 2017-09-05 (×15): qty 50

## 2017-09-05 MED ORDER — VANCOMYCIN HCL IN DEXTROSE 750-5 MG/150ML-% IV SOLN
750.0000 mg | INTRAVENOUS | Status: DC
  Administered 2017-09-06 – 2017-09-10 (×5): 750 mg via INTRAVENOUS
  Filled 2017-09-05 (×5): qty 150

## 2017-09-05 MED ORDER — PIPERACILLIN-TAZOBACTAM 3.375 G IVPB 30 MIN: 3.3750 g | Freq: Three times a day (TID) | INTRAVENOUS | Status: DC

## 2017-09-05 NOTE — Consult Note (Signed)
Reason for Consult: right hip infection Referring Physician: Dr. Nevada Crane, [facility]  [name] [name] is an 76 y.o. female.  HPI: The patient is a 76 year old female who is been in the hospital for about 11 days now after potentially a seizure.  She has some with multiple medical problems who I actually performed a right total hip arthroplasty on between 5 and 6 weeks ago  to treat a right femoral neck fracture.  I had actually seen her back postoperative and she was doing well.  I was unaware of this most recent admission but apparently she is been in the hospital according to her husband for the last 11 days due to being sickly and a potential seizure.  Apparently she has been treated off and on for urinary tract infection as well.  She is a diabetic.  I was consulted today due to purulent drainage that was noted coming from her right hip incision.  There was concern about her becoming septic and the primary service is started vancomycin and Zosyn.  She is also on tube feeds.  Her husband is at the bedside right now.  Past Medical History:  Diagnosis Date  . Anemia   . Arthritis    "a touch in my hands" (11/17/2016)  . Chronic back pain   . Chronic kidney disease (CKD), active medical management without dialysis, stage 3 (moderate) ([place])   . Depression   . GERD (gastroesophageal reflux disease)   . High cholesterol   . Hyperlipidemia   . Hypertension   . Seizures ([place])    last seizure 2014 (11/17/2016)  . Stroke [facility]) 2013   Secondary to acute right posterior temporo-occipital intra-axial hemorrhage  . Type II diabetes mellitus ([place])     Past Surgical History:  Procedure Laterality Date  . CATARACT EXTRACTION W/ INTRAOCULAR LENS IMPLANT Bilateral   . COLONOSCOPY    . DILATION AND CURETTAGE OF UTERUS  X 3  . 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 TOTAL HIP ARTHROPLASTY Right 07/28/2017   Procedure: TOTAL HIP ARTHROPLASTY ANTERIOR APPROACH;  Surgeon: Mcarthur Rossetti, MD;  Location: [place];  Service: Orthopedics;  Laterality: Right;    Family History  Problem Relation Age of Onset  . Cancer Mother   . Colon cancer Mother   . Liver disease Mother   . Breast cancer Mother   . Cancer Father   . Diabetes Paternal Grandmother   . Breast cancer Maternal Aunt   . Breast cancer Maternal Aunt   . Esophageal cancer Neg Hx   . Rectal cancer Neg Hx   . Stomach cancer Neg Hx     Social History:  reports that she has never smoked. She has never used smokeless tobacco. She reports that she does not drink alcohol or use drugs.  Allergies:  Allergies  Allergen Reactions  . Barbiturates     Becomes restless.  Also with all "strong" medications like sleeping pills  . Latex Itching    Medications: I have reviewed the patient's current medications.  Results for orders placed or performed during the hospital encounter of 08/26/17 (from the past 48 hour(s))  Glucose, capillary     Status: Abnormal   Collection Time: 09/03/17 12:57 PM  Result Value Ref Range   Glucose-Capillary 188 (H) 65 - 99 mg/dL  Glucose, capillary     Status:  Abnormal   Collection Time: 09/03/17  4:31 PM  Result Value Ref Range   Glucose-Capillary 238 (H) 65 - 99 mg/dL  Glucose, capillary     Status: Abnormal   Collection Time: 09/03/17  8:09 PM  Result Value Ref Range   Glucose-Capillary 244 (H) 65 - 99 mg/dL  Glucose, capillary     Status: Abnormal   Collection Time: 09/04/17 12:13 AM  Result Value Ref Range   Glucose-Capillary 328 (H) 65 - 99 mg/dL  Glucose, capillary     Status: Abnormal   Collection Time: 09/04/17  3:45 AM  Result Value Ref Range   Glucose-Capillary 372 (H) 65 - 99 mg/dL  Basic metabolic panel     Status: Abnormal   Collection Time: 09/04/17  4:05 AM  Result Value Ref Range   Sodium 143 135 - 145 mmol/L   Potassium 4.9 3.5 -  5.1 mmol/L   Chloride 108 101 - 111 mmol/L   CO2 24 22 - 32 mmol/L   Glucose, Bld 421 (H) 65 - 99 mg/dL   BUN 8 6 - 20 mg/dL   Creatinine, Ser 1.21 (H) 0.44 - 1.00 mg/dL   Calcium 8.2 (L) 8.9 - 10.3 mg/dL   GFR calc non Af Amer 42 (L) >60 mL/min   GFR calc Af Amer 49 (L) >60 mL/min    Comment: (NOTE) The eGFR has been calculated using the CKD EPI equation. This calculation has not been validated in all clinical situations. eGFR's persistently <60 mL/min signify possible Chronic Kidney Disease.    Anion gap 11 5 - 15  Magnesium     Status: Abnormal   Collection Time: 09/04/17  4:05 AM  Result Value Ref Range   Magnesium 1.3 (L) 1.7 - 2.4 mg/dL  Phosphorus     Status: Abnormal   Collection Time: 09/04/17  4:05 AM  Result Value Ref Range   Phosphorus 2.2 (L) 2.5 - 4.6 mg/dL  Glucose, capillary     Status: Abnormal   Collection Time: 09/04/17  7:55 AM  Result Value Ref Range   Glucose-Capillary 305 (H) 65 - 99 mg/dL  CBC     Status: Abnormal   Collection Time: 09/04/17 10:10 AM  Result Value Ref Range   WBC 17.6 (H) 4.0 - 10.5 K/uL   RBC 2.90 (L) 3.87 - 5.11 MIL/uL   Hemoglobin 9.1 (L) 12.0 - 15.0 g/dL   HCT 29.9 (L) 36.0 - 46.0 %   MCV 103.1 (H) 78.0 - 100.0 fL   MCH 31.4 26.0 - 34.0 pg   MCHC 30.4 30.0 - 36.0 g/dL   RDW 16.3 (H) 11.5 - 15.5 %   Platelets 370 150 - 400 K/uL  Glucose, capillary     Status: Abnormal   Collection Time: 09/04/17 11:50 AM  Result Value Ref Range   Glucose-Capillary 110 (H) 65 - 99 mg/dL  Glucose, capillary     Status: Abnormal   Collection Time: 09/04/17  4:24 PM  Result Value Ref Range   Glucose-Capillary 280 (H) 65 - 99 mg/dL  Ammonia     Status: Abnormal   Collection Time: 09/04/17  4:25 PM  Result Value Ref Range   Ammonia 44 (H) 9 - 35 umol/L  Glucose, capillary     Status: Abnormal   Collection Time: 09/04/17  9:09 PM  Result Value Ref Range   Glucose-Capillary 236 (H) 65 - 99 mg/dL  Glucose, capillary     Status: Abnormal    Collection Time: 09/05/17 12:47 AM  Result Value Ref Range   Glucose-Capillary 253 (H) 65 - 99 mg/dL  Lactic acid, plasma     Status: None   Collection Time: 09/05/17  2:04 AM  Result Value Ref Range   Lactic Acid, Venous 1.6 0.5 - 1.9 mmol/L  Ammonia     Status: None   Collection Time: 09/05/17  2:05 AM  Result Value Ref Range   Ammonia 25 9 - 35 umol/L  Glucose, capillary     Status: Abnormal   Collection Time: 09/05/17  4:58 AM  Result Value Ref Range   Glucose-Capillary 280 (H) 65 - 99 mg/dL  Lactic acid, plasma     Status: None   Collection Time: 09/05/17  5:33 AM  Result Value Ref Range   Lactic Acid, Venous 1.7 0.5 - 1.9 mmol/L  Basic metabolic panel     Status: Abnormal   Collection Time: 09/05/17  5:34 AM  Result Value Ref Range   Sodium 142 135 - 145 mmol/L   Potassium 5.0 3.5 - 5.1 mmol/L   Chloride 107 101 - 111 mmol/L   CO2 28 22 - 32 mmol/L   Glucose, Bld 312 (H) 65 - 99 mg/dL   BUN 8 6 - 20 mg/dL   Creatinine, Ser 1.10 (H) 0.44 - 1.00 mg/dL   Calcium 8.3 (L) 8.9 - 10.3 mg/dL   GFR calc non Af Amer 48 (L) >60 mL/min   GFR calc Af Amer 55 (L) >60 mL/min    Comment: (NOTE) The eGFR has been calculated using the CKD EPI equation. This calculation has not been validated in all clinical situations. eGFR's persistently <60 mL/min signify possible Chronic Kidney Disease.    Anion gap 7 5 - 15  Magnesium     Status: Abnormal   Collection Time: 09/05/17  5:34 AM  Result Value Ref Range   Magnesium 1.0 (L) 1.7 - 2.4 mg/dL  Phosphorus     Status: Abnormal   Collection Time: 09/05/17  5:34 AM  Result Value Ref Range   Phosphorus 2.4 (L) 2.5 - 4.6 mg/dL  CBC     Status: Abnormal   Collection Time: 09/05/17  5:34 AM  Result Value Ref Range   WBC 18.4 (H) 4.0 - 10.5 K/uL   RBC 2.82 (L) 3.87 - 5.11 MIL/uL   Hemoglobin 8.7 (L) 12.0 - 15.0 g/dL   HCT 29.0 (L) 36.0 - 46.0 %   MCV 102.8 (H) 78.0 - 100.0 fL   MCH 30.9 26.0 - 34.0 pg   MCHC 30.0 30.0 - 36.0 g/dL   RDW  15.7 (H) 11.5 - 15.5 %   Platelets 392 150 - 400 K/uL  Procalcitonin - Baseline     Status: None   Collection Time: 09/05/17  5:34 AM  Result Value Ref Range   Procalcitonin 0.22 ng/mL    Comment:        Interpretation: PCT (Procalcitonin) <= 0.5 ng/mL: Systemic infection (sepsis) is not likely. Local bacterial infection is possible. (NOTE)         ICU PCT Algorithm               Non ICU PCT Algorithm    ----------------------------     ------------------------------         PCT < 0.25 ng/mL                 PCT < 0.1 ng/mL     Stopping of antibiotics  Stopping of antibiotics       strongly encouraged.               strongly encouraged.    ----------------------------     ------------------------------       PCT level decrease by               PCT < 0.25 ng/mL       >= 80% from peak PCT       OR PCT 0.25 - 0.5 ng/mL          Stopping of antibiotics                                             encouraged.     Stopping of antibiotics           encouraged.    ----------------------------     ------------------------------       PCT level decrease by              PCT >= 0.25 ng/mL       < 80% from peak PCT        AND PCT >= 0.5 ng/mL            Continuin g antibiotics                                              encouraged.       Continuing antibiotics            encouraged.    ----------------------------     ------------------------------     PCT level increase compared          PCT > 0.5 ng/mL         with peak PCT AND          PCT >= 0.5 ng/mL             Escalation of antibiotics                                          strongly encouraged.      Escalation of antibiotics        strongly encouraged.   Glucose, capillary     Status: Abnormal   Collection Time: 09/05/17  7:47 AM  Result Value Ref Range   Glucose-Capillary 249 (H) 65 - 99 mg/dL   Comment 1 Notify RN   Glucose, capillary     Status: Abnormal   Collection Time: 09/05/17  9:45 AM  Result Value Ref  Range   Glucose-Capillary 265 (H) 65 - 99 mg/dL  Glucose, capillary     Status: Abnormal   Collection Time: 09/05/17 11:21 AM  Result Value Ref Range   Glucose-Capillary 290 (H) 65 - 99 mg/dL   Comment 1 Notify RN     Dg Chest Port 1 View  Result Date: 09/05/2017 CLINICAL DATA:  76 year old female with a history of respiratory failure EXAM: PORTABLE CHEST 1 VIEW COMPARISON:  08/26/2017 FINDINGS: Cardiomediastinal silhouette unchanged. Compare to the prior there is mild increase prominence of the interstitial markings with interlobular septal thickening. No confluent airspace disease. No pneumothorax or pleural effusion.  Persistent pleuroparenchymal scarring at the lung apices. Enteric feeding tube terminates out of the field of view. No displaced fracture. IMPRESSION: Coarsened interstitial markings with mild interlobular septal thickening may represent chronic changes, however, early pulmonary edema not excluded. No evidence of multifocal pneumonia. Enteric tube terminates out of the field of view. Electronically Signed   By: Corrie Mckusick D.O.   On: 09/05/2017 09:32   Dg Hip Port Unilat With Pelvis 1v Right  Result Date: 09/05/2017 CLINICAL DATA:  Right hip drainage. Status post right hip replacement on 07/28/2017. Clinical diagnosis of purulent dermatitis. EXAM: DG HIP (WITH OR WITHOUT PELVIS) 1V PORT RIGHT COMPARISON:  07/28/2017 and 07/27/2017. FINDINGS: Right hip bipolar prosthesis in satisfactory position and alignment. No bone destruction, periosteal reaction or periprosthetic lucency. No soft tissue gas. Mild left hip degenerative changes. IMPRESSION: No acute abnormality. Electronically Signed   By: Claudie Revering M.D.   On: 09/05/2017 08:52    ROS Blood pressure 138/84, pulse (!) 108, temperature 98.7 F (37.1 C), temperature source Axillary, resp. rate 17, height '5\' 4"'  (1.626 m), weight 132 lb (59.9 kg), SpO2 97 %. Physical Exam  Musculoskeletal:       Right hip: She exhibits  tenderness.  On inspection of her right hip incision there is a small area there is open at the most superior aspect of the incision with some slight rim of redness.  I can express grossly purulent material from the incision.  Assessment/Plan: Right hip infection  Given the purulent drainage coming from the top of her incision I do feel that she does need an irrigation and debridement of her hip prosthesis in attempt to salvage the prosthesis.  Given the acute nature of this hopefully the IV antibiotics and a washout will help improve her medical status.  She is currently on tube feeds.  I have communicated with the primary service and will plan on surgery first thing tomorrow morning.  I talked her husband in length about this as well.  All questions and concerns were answered and addressed.  Mcarthur Rossetti 09/05/2017, 11:38 AM

## 2017-09-05 NOTE — Progress Notes (Signed)
Per Dr. Ninfa Linden, there is no need to culture the incision site today.  Will hold of on Aerobic/Anaerobic wound culture for right now.

## 2017-09-05 NOTE — Progress Notes (Signed)
PROGRESS NOTE  [name] [name] HUD:[id number] DOB: April 15, 1941 DOA: 08/26/2017 PCP: [name], MD  HPI/Recap of past 24 hours: Purulent discharge noted this morning from patient's right hip. IV zosyn and IV vancomycin started empirically. Cultures drawn. Dr. Ninfa Linden consulted and will see the patient. Patient continues to be non verbal. Goals of care discussed with the husband who would like everything done in order to bring his wife home safely. All questions answered to his satisfaction.  Assessment/Plan: Active Problems:   GERD (gastroesophageal reflux disease)   DM type 2 (diabetes mellitus, type 2) (HCC)   HTN (hypertension)   CKD (chronic kidney disease) stage 3, GFR 30-59 ml/min (HCC)   Acute encephalopathy   Seizure disorder, grand mal ([place])   UTI (urinary tract infection)   Sepsis, unspecified organism ([place])   Status epilepticus ([place])   S/P right hip fracture  Code Status:Full  Family Communication:No family members at her bedside.   Disposition Plan:Will stay another midnight to continue current treatment.   Consultants:  Neurology  Procedures:  Continuous video-EEG monitoring  Antimicrobials:  None   DVT prophylaxis: Hep sq 5000 U TID  Objective: Vitals:   09/05/17 0009 09/05/17 0148 09/05/17 0604 09/05/17 0744  BP: (!) 161/103 (!) 160/107 (!) 144/116 (!) 145/103  Pulse: (!) 108 (!) 110 (!) 105 (!) 110  Resp: 17  (!) 21 18  Temp: 98 F (36.7 C)   99.5 F (37.5 C)  TempSrc: Axillary   Axillary  SpO2: 97%  97% 97%  Weight:      Height:        Intake/Output Summary (Last 24 hours) at 09/05/17 0745 Last data filed at 09/05/17 0439  Gross per 24 hour  Intake          1084.17 ml  Output             2450 ml  Net         -1365.83 ml   Filed Weights   09/02/17 0416 09/03/17 0421 09/04/17 0336  Weight: 59.9 kg (132 lb) 60.4 kg (133 lb 1.6 oz) 59.9 kg (132 lb)    Exam:  General:eyes open, but minimally follow commands. Appears  uncomfortable when her right hip is touched. Neck: Negative scars,lymphadenopathy, JVD Lungs:Clear to auscultation bilaterally without wheezes or crackles Cardiovascular:Regular rate and rhythm without murmur gallop or rub normal S1 and S2 Abdomen: negative abdominal pain, nondistended, positive soft, bowel sounds, no rebound, no ascites, no appreciable mass Extremities:Right hip at site of incision has purulent discharge Skin:Negative rashes, pus from right hip Psychiatric:Unable to evaluate secondary to altered mental status Central nervous system: Unable to do a neurological exam as the pt does not follow commands.  Data Reviewed: CBC:  Recent Labs Lab 08/31/17 0446 09/01/17 0523 09/02/17 0306 09/04/17 1010 09/05/17 0534  WBC 8.7 11.5* 7.9 17.6* 18.4*  HGB 10.0* 10.3* 10.2* 9.1* 8.7*  HCT 32.5* 32.9* 33.4* 29.9* 29.0*  MCV 100.0 100.3* 102.1* 103.1* 102.8*  PLT 170 228 228 370 858   Basic Metabolic Panel:  Recent Labs Lab 09/01/17 0523 09/02/17 0306 09/02/17 1433 09/02/17 1642 09/03/17 0333 09/04/17 0405 09/05/17 0534  NA 144 142  --   --  144 143 142  K 2.6* 4.3  --   --  4.4 4.9 5.0  CL 103 105  --   --  110 108 107  CO2 32 32  --   --  26 24 28   GLUCOSE 48* 193*  --   --  232* 421* 312*  BUN <5* <5*  --   --  <5* 8 8  CREATININE 1.01* 1.00  --   --  1.06* 1.21* 1.10*  CALCIUM 8.6* 8.4*  --   --  8.4* 8.2* 8.3*  MG  --  1.2* 1.9 1.7 1.5* 1.3* 1.0*  PHOS  --   --  2.1* 2.1* 2.3* 2.2* 2.4*   GFR: Estimated Creatinine Clearance: 37.6 mL/min (A) (by C-G formula based on SCr of 1.1 mg/dL (H)). Liver Function Tests:  Recent Labs Lab 09/02/17 0306  AST 17  ALT 7*  ALKPHOS 88  BILITOT 0.5  PROT 5.3*  ALBUMIN 1.8*   No results for input(s): LIPASE, AMYLASE in the last 168 hours.  Recent Labs Lab 09/01/17 1028 09/02/17 0306 09/04/17 1625 09/05/17 0205  AMMONIA 60* 67* 44* 25   Coagulation Profile: No results for input(s): INR, PROTIME in the  last 168 hours. Cardiac Enzymes: No results for input(s): CKTOTAL, CKMB, CKMBINDEX, TROPONINI in the last 168 hours. BNP (last 3 results) No results for input(s): PROBNP in the last 8760 hours. HbA1C: No results for input(s): HGBA1C in the last 72 hours. CBG:  Recent Labs Lab 09/04/17 1150 09/04/17 1624 09/04/17 2109 09/05/17 0047 09/05/17 0458  GLUCAP 110* 280* 236* 253* 280*   Lipid Profile: No results for input(s): CHOL, HDL, LDLCALC, TRIG, CHOLHDL, LDLDIRECT in the last 72 hours. Thyroid Function Tests: No results for input(s): TSH, T4TOTAL, FREET4, T3FREE, THYROIDAB in the last 72 hours. Anemia Panel:  Recent Labs  09/02/17 1143  VITAMINB12 1,319*  FOLATE 30.0   Urine analysis:    Component Value Date/Time   COLORURINE YELLOW 08/26/2017 0345   APPEARANCEUR CLEAR 08/26/2017 0345   LABSPEC 1.015 08/26/2017 0345   PHURINE 6.0 08/26/2017 0345   GLUCOSEU 50 (A) 08/26/2017 0345   HGBUR NEGATIVE 08/26/2017 0345   BILIRUBINUR NEGATIVE 08/26/2017 0345   BILIRUBINUR neg 08/16/2012 1531   KETONESUR 5 (A) 08/26/2017 0345   PROTEINUR 30 (A) 08/26/2017 0345   UROBILINOGEN 0.2 06/25/2013 1002   NITRITE NEGATIVE 08/26/2017 0345   LEUKOCYTESUR TRACE (A) 08/26/2017 0345   Sepsis Labs: @LABRCNTIP (procalcitonin:4,lacticidven:4)  ) Recent Results (from the past 240 hour(s))  MRSA PCR Screening     Status: Abnormal   Collection Time: 08/27/17  3:47 AM  Result Value Ref Range Status   MRSA by PCR POSITIVE (A) NEGATIVE Final    Comment:        The GeneXpert MRSA Assay (FDA approved for NASAL specimens only), is one component of a comprehensive MRSA colonization surveillance program. It is not intended to diagnose MRSA infection nor to guide or monitor treatment for MRSA infections. RESULT CALLED TO, READ BACK BY AND VERIFIED WITH: R.PIVERJER RN 08/27/17 0724 L.CHAMPION   Culture, Urine     Status: None   Collection Time: 08/27/17 10:47 AM  Result Value Ref Range  Status   Specimen Description URINE, CATHETERIZED  Final   Special Requests NONE  Final   Culture NO GROWTH  Final   Report Status 08/28/2017 FINAL  Final  CSF culture with Stat gram stain     Status: None   Collection Time: 08/27/17  3:44 PM  Result Value Ref Range Status   Specimen Description CSF  Final   Special Requests Normal  Final   Gram Stain   Final    WBC PRESENT, PREDOMINANTLY MONONUCLEAR NO ORGANISMS SEEN CYTOSPIN SMEAR    Culture NO GROWTH 3 DAYS  Final   Report Status  08/30/2017 FINAL  Final      Studies: No results found.  Scheduled Meds: . aspirin  325 mg Per Tube Daily  . atorvastatin  10 mg Per Tube Daily  . chlorhexidine  15 mL Mouth Rinse BID  . heparin  5,000 Units Subcutaneous Q8H  . insulin aspart  0-9 Units Subcutaneous Q4H  . lactulose  10 g Per Tube TID  . levETIRAcetam  500 mg Per Tube BID  . mouth rinse  15 mL Mouth Rinse q12n4p  . metoprolol tartrate  5 mg Intravenous Q6H  . pantoprazole sodium  40 mg Per Tube Daily    Continuous Infusions: . 0.9 % NaCl with KCl 40 mEq / L 75 mL/hr (09/05/17 0222)  . feeding supplement (JEVITY 1.2 CAL) 1,000 mL (09/04/17 2113)  . piperacillin-tazobactam    . 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 Ninfa Linden.  Diabetes Type 2 controlled with renal complication / Hypoglycemia A1C=6.5 (08/26/17) -suffered an episode of hypoglycemia during this hospitalization. Currently CBG well controlled  Severe protein calorie malnutrition in context of chronic illness -CorTrak tube placed 10/31. Tube feeds begun will slowly titrate up. -GI consulted for possible peg tube placement -Keep head of bed above 35 degrees angle -aspiration precaution  Severe deconditioning/weakness -Nutrition and PT/OT when more stable    Kayleen Memos, MD [facility] Pager [phone]  If 7PM-7AM, please contact night-coverage [email or web address] Password [email or web address] 09/05/2017, 7:45 AM

## 2017-09-05 NOTE — Progress Notes (Addendum)
Patient had pus coming out of right hip , s/p patient had surgery done in sept 2018. Text paged Jeannette Corpus. Awaiting response.  Sterile 4x4 gauze applied

## 2017-09-05 NOTE — Progress Notes (Signed)
Text paged Dr C. Hall for right hip pus. Informed oncoming RN Jiles Garter to follow up with MD.

## 2017-09-05 NOTE — Consult Note (Signed)
Consult for [facility]  Reason for Consult: Feeding difficulties Referring Physician: [facility]  [name] HPI: This is a 76 year old female with a PMH of right posterior temporo occipital hemorrhage, history of seizures, DM, HTN, CKD III, and hyperlipidemia originally admitted on 08/26/2017 for prolonged seizure.  Through her hospitalization was was determined that her seizure was most likely secondary to her UTI.  Currently she exhibits severe toxic-metabolic encephalopathy that waxes and wanes.  She was evaluated by Speech Pathology and the patient was not able to cooperate with the examination.  She did not exhibit cognitive ability to perform any swallowing tasks.  Her husband desires all interventions to be pursued.  He was initially resistant to an NG tube, but the then allowed this intervention.  Past Medical History:  Diagnosis Date  . Anemia   . Arthritis    "a touch in my hands" (11/17/2016)  . Chronic back pain   . Chronic kidney disease (CKD), active medical management without dialysis, stage 3 (moderate) ([place])   . Depression   . GERD (gastroesophageal reflux disease)   . High cholesterol   . Hyperlipidemia   . Hypertension   . Seizures ([place])    last seizure 2014 (11/17/2016)  . Stroke [facility]) 2013   Secondary to acute right posterior temporo-occipital intra-axial hemorrhage  . Type II diabetes mellitus ([place])     Past Surgical History:  Procedure Laterality Date  . CATARACT EXTRACTION W/ INTRAOCULAR LENS IMPLANT Bilateral   . COLONOSCOPY    . DILATION AND CURETTAGE OF UTERUS  X 3  . 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 TOTAL HIP ARTHROPLASTY Right 07/28/2017   Procedure: TOTAL HIP ARTHROPLASTY ANTERIOR APPROACH;  Surgeon: Mcarthur Rossetti, MD;  Location: [facility];  Service: Orthopedics;  Laterality: Right;    Family  History  Problem Relation Age of Onset  . Cancer Mother   . Colon cancer Mother   . Liver disease Mother   . Breast cancer Mother   . Cancer Father   . Diabetes Paternal Grandmother   . Breast cancer Maternal Aunt   . Breast cancer Maternal Aunt   . Esophageal cancer Neg Hx   . Rectal cancer Neg Hx   . Stomach cancer Neg Hx     Social History:  reports that she has never smoked. She has never used smokeless tobacco. She reports that she does not drink alcohol or use drugs.  Allergies:  Allergies  Allergen Reactions  . Barbiturates     Becomes restless.  Also with all "strong" medications like sleeping pills  . Latex Itching    Medications:  Scheduled: . aspirin  325 mg Per Tube Daily  . atorvastatin  10 mg Per Tube Daily  . chlorhexidine  15 mL Mouth Rinse BID  . heparin  5,000 Units Subcutaneous Q8H  . insulin aspart  0-9 Units Subcutaneous Q4H  . lactulose  10 g Per Tube TID  . levETIRAcetam  500 mg Per Tube BID  . mouth rinse  15 mL Mouth Rinse q12n4p  . metoprolol tartrate  5 mg Intravenous Q6H  . pantoprazole sodium  40 mg Per Tube Daily   Continuous: . 0.9 % NaCl with KCl 40 mEq / L 75 mL/hr (09/05/17 0222)  . feeding supplement (JEVITY 1.2 CAL) 1,000 mL (09/04/17 2113)  . magnesium sulfate  1 - 4 g bolus IVPB    . piperacillin-tazobactam (ZOSYN)  IV    . [START ON 09/06/2017] vancomycin      Results for orders placed or performed during the hospital encounter of 08/26/17 (from the past 24 hour(s))  Glucose, capillary     Status: Abnormal   Collection Time: 09/04/17 11:50 AM  Result Value Ref Range   Glucose-Capillary 110 (H) 65 - 99 mg/dL  Glucose, capillary     Status: Abnormal   Collection Time: 09/04/17  4:24 PM  Result Value Ref Range   Glucose-Capillary 280 (H) 65 - 99 mg/dL  Ammonia     Status: Abnormal   Collection Time: 09/04/17  4:25 PM  Result Value Ref Range   Ammonia 44 (H) 9 - 35 umol/L  Glucose, capillary     Status: Abnormal   Collection  Time: 09/04/17  9:09 PM  Result Value Ref Range   Glucose-Capillary 236 (H) 65 - 99 mg/dL  Glucose, capillary     Status: Abnormal   Collection Time: 09/05/17 12:47 AM  Result Value Ref Range   Glucose-Capillary 253 (H) 65 - 99 mg/dL  Lactic acid, plasma     Status: None   Collection Time: 09/05/17  2:04 AM  Result Value Ref Range   Lactic Acid, Venous 1.6 0.5 - 1.9 mmol/L  Ammonia     Status: None   Collection Time: 09/05/17  2:05 AM  Result Value Ref Range   Ammonia 25 9 - 35 umol/L  Glucose, capillary     Status: Abnormal   Collection Time: 09/05/17  4:58 AM  Result Value Ref Range   Glucose-Capillary 280 (H) 65 - 99 mg/dL  Lactic acid, plasma     Status: None   Collection Time: 09/05/17  5:33 AM  Result Value Ref Range   Lactic Acid, Venous 1.7 0.5 - 1.9 mmol/L  Basic metabolic panel     Status: Abnormal   Collection Time: 09/05/17  5:34 AM  Result Value Ref Range   Sodium 142 135 - 145 mmol/L   Potassium 5.0 3.5 - 5.1 mmol/L   Chloride 107 101 - 111 mmol/L   CO2 28 22 - 32 mmol/L   Glucose, Bld 312 (H) 65 - 99 mg/dL   BUN 8 6 - 20 mg/dL   Creatinine, Ser 1.10 (H) 0.44 - 1.00 mg/dL   Calcium 8.3 (L) 8.9 - 10.3 mg/dL   GFR calc non Af Amer 48 (L) >60 mL/min   GFR calc Af Amer 55 (L) >60 mL/min   Anion gap 7 5 - 15  Magnesium     Status: Abnormal   Collection Time: 09/05/17  5:34 AM  Result Value Ref Range   Magnesium 1.0 (L) 1.7 - 2.4 mg/dL  Phosphorus     Status: Abnormal   Collection Time: 09/05/17  5:34 AM  Result Value Ref Range   Phosphorus 2.4 (L) 2.5 - 4.6 mg/dL  CBC     Status: Abnormal   Collection Time: 09/05/17  5:34 AM  Result Value Ref Range   WBC 18.4 (H) 4.0 - 10.5 K/uL   RBC 2.82 (L) 3.87 - 5.11 MIL/uL   Hemoglobin 8.7 (L) 12.0 - 15.0 g/dL   HCT 29.0 (L) 36.0 - 46.0 %   MCV 102.8 (H) 78.0 - 100.0 fL   MCH 30.9 26.0 - 34.0 pg   MCHC 30.0 30.0 - 36.0 g/dL   RDW 15.7 (H) 11.5 - 15.5 %   Platelets 392 150 -  400 K/uL  Procalcitonin - Baseline      Status: None   Collection Time: 09/05/17  5:34 AM  Result Value Ref Range   Procalcitonin 0.22 ng/mL  Glucose, capillary     Status: Abnormal   Collection Time: 09/05/17  7:47 AM  Result Value Ref Range   Glucose-Capillary 249 (H) 65 - 99 mg/dL   Comment 1 Notify RN   Glucose, capillary     Status: Abnormal   Collection Time: 09/05/17  9:45 AM  Result Value Ref Range   Glucose-Capillary 265 (H) 65 - 99 mg/dL     Dg Chest Port 1 View  Result Date: 09/05/2017 CLINICAL DATA:  76 year old female with a history of respiratory failure EXAM: PORTABLE CHEST 1 VIEW COMPARISON:  08/26/2017 FINDINGS: Cardiomediastinal silhouette unchanged. Compare to the prior there is mild increase prominence of the interstitial markings with interlobular septal thickening. No confluent airspace disease. No pneumothorax or pleural effusion. Persistent pleuroparenchymal scarring at the lung apices. Enteric feeding tube terminates out of the field of view. No displaced fracture. IMPRESSION: Coarsened interstitial markings with mild interlobular septal thickening may represent chronic changes, however, early pulmonary edema not excluded. No evidence of multifocal pneumonia. Enteric tube terminates out of the field of view. Electronically Signed   By: Corrie Mckusick D.O.   On: 09/05/2017 09:32   Dg Hip Port Unilat With Pelvis 1v Right  Result Date: 09/05/2017 CLINICAL DATA:  Right hip drainage. Status post right hip replacement on 07/28/2017. Clinical diagnosis of purulent dermatitis. EXAM: DG HIP (WITH OR WITHOUT PELVIS) 1V PORT RIGHT COMPARISON:  07/28/2017 and 07/27/2017. FINDINGS: Right hip bipolar prosthesis in satisfactory position and alignment. No bone destruction, periosteal reaction or periprosthetic lucency. No soft tissue gas. Mild left hip degenerative changes. IMPRESSION: No acute abnormality. Electronically Signed   By: Claudie Revering M.D.   On: 09/05/2017 08:52    ROS:  As stated above in the HPI otherwise  negative.  Blood pressure (!) 165/82, pulse 98, temperature 98.9 F (37.2 C), temperature source Axillary, resp. rate 20, height 5\' 4"  (1.626 m), weight 59.9 kg (132 lb), SpO2 98 %.    PE: Gen: Agitated. HEENT:  [place]/AT, EOMI Lungs: CTA Bilaterally CV: RRR without M/G/R ABM: Soft, NTND, +BS Ext: No C/C/E  Assessment/Plan: 1) Feeding difficulties. 2) CVA. 3) Right hip wound infection.   I had a long discussion with the patient's husband.  The patient will be best served with a PEG tube.  She is not able to safely tolerate any PO intake at this time.  I did tell the husband that a PEG tube can be temporary and if she recovers her mentation the tube can be removed.  There is no rush to perform the PEG tube at this time.  This will allow the husband to think about this intervention.  She is to have surgical treatment of her wound infection tomorrow.  Makyle Eslick D 09/05/2017, 11:17 AM

## 2017-09-05 NOTE — Progress Notes (Addendum)
Pharmacy Antibiotic Note  [name] [name] is a 76 y.o. female admitted on 08/26/2017 with prosthetic joint infection.  Pharmacy has been consulted for vancomycin and zosyn dosing.  Per MD, pus is oozing from the right hip concerning for prosthetic joint infection (hip surgery 07/2017). Blood cultures have been obtained. Patient is afebrile, WBC are elevated at 18.4, PCT 0.22, LA 1.7. I&D planned for 11/4.  Was on rocephin/Vanc/zosyn for sepsis r/o recently. Last dose of Rocephin was on 10/25, and  vancomycin and Zosyn was on 10/29.   Scr is 1.10, CrCl ~35-40 ml/min.   Plan: Vancomycin 1250 mg IV bolus x 1 Vancomycin 750 mg Iv Q24h Zosyn 3.375 mg IV Q8h Monitor clinical progression, Cx, LOT, and de-escalation   Height: 5\' 4"  (162.6 cm) Weight: 132 lb (59.9 kg) IBW/kg (Calculated) : 54.7  Temp (24hrs), Avg:98.8 F (37.1 C), Min:98 F (36.7 C), Max:99.6 F (37.6 C)   Recent Labs Lab 08/31/17 0446 09/01/17 0523 09/02/17 0306 09/03/17 0333 09/04/17 0405 09/04/17 1010 09/05/17 0204 09/05/17 0533 09/05/17 0534  WBC 8.7 11.5* 7.9  --   --  17.6*  --   --  18.4*  CREATININE 1.15* 1.01* 1.00 1.06* 1.21*  --   --   --  1.10*  LATICACIDVEN  --   --   --   --   --   --  1.6 1.7  --     Estimated Creatinine Clearance: 37.6 mL/min (A) (by C-G formula based on SCr of 1.1 mg/dL (H)).    Allergies  Allergen Reactions  . Barbiturates     Becomes restless.  Also with all "strong" medications like sleeping pills  . Latex Itching    Antimicrobials this admission: 10/24 rocephin >> 10/25 10/25 vanc >>10/29; 11/3 10/25 zosyn >>10/29; 11/3  Dose adjustments this admission:   Microbiology results: 10/25 CSF >> NGx2 10/25 urine >> neg 10/24 blood x2 >> NGx3 10/25 MRSA PCR +  11/3 BCx x 2>>   [name], PharmD Pharmacy Resident Pager: [phone]

## 2017-09-06 ENCOUNTER — Encounter (HOSPITAL_COMMUNITY): Payer: Self-pay | Admitting: Certified Registered Nurse Anesthetist

## 2017-09-06 ENCOUNTER — Inpatient Hospital Stay (HOSPITAL_COMMUNITY): Payer: Medicare Other

## 2017-09-06 ENCOUNTER — Encounter (HOSPITAL_COMMUNITY)
Admission: EM | Disposition: A | Discharge status: Hospice | Payer: Self-pay | Source: Home / Self Care | Attending: Internal Medicine

## 2017-09-06 DIAGNOSIS — R0989 Other specified symptoms and signs involving the circulatory and respiratory systems: Secondary | ICD-10-CM

## 2017-09-06 DIAGNOSIS — T8149XA Infection following a procedure, other surgical site, initial encounter: Secondary | ICD-10-CM

## 2017-09-06 HISTORY — PX: I & D EXTREMITY: SHX5045

## 2017-09-06 LAB — BASIC METABOLIC PANEL
ANION GAP: 9 (ref 5–15)
BUN: 13 mg/dL (ref 6–20)
CALCIUM: 8.7 mg/dL — AB (ref 8.9–10.3)
CHLORIDE: 108 mmol/L (ref 101–111)
CO2: 26 mmol/L (ref 22–32)
CREATININE: 1.38 mg/dL — AB (ref 0.44–1.00)
GFR calc non Af Amer: 36 mL/min — ABNORMAL LOW (ref >60)
GFR, EST AFRICAN AMERICAN: 42 mL/min — AB (ref >60)
Glucose, Bld: 47 mg/dL — ABNORMAL LOW (ref 65–99)
Potassium: 4.1 mmol/L (ref 3.5–5.1)
SODIUM: 143 mmol/L (ref 135–145)

## 2017-09-06 LAB — CBC
HEMATOCRIT: 29.7 % — AB (ref 36.0–46.0)
Hemoglobin: 9.1 g/dL — ABNORMAL LOW (ref 12.0–15.0)
MCH: 31.5 pg (ref 26.0–34.0)
MCHC: 30.6 g/dL (ref 30.0–36.0)
MCV: 102.8 fL — ABNORMAL HIGH (ref 78.0–100.0)
PLATELETS: 545 10*3/uL — AB (ref 150–400)
RBC: 2.89 MIL/uL — ABNORMAL LOW (ref 3.87–5.11)
RDW: 15.9 % — AB (ref 11.5–15.5)
WBC: 25.5 10*3/uL — AB (ref 4.0–10.5)

## 2017-09-06 LAB — GLUCOSE, CAPILLARY
GLUCOSE-CAPILLARY: 156 mg/dL — AB (ref 65–99)
GLUCOSE-CAPILLARY: 200 mg/dL — AB (ref 65–99)
GLUCOSE-CAPILLARY: 288 mg/dL — AB (ref 65–99)
Glucose-Capillary: 360 mg/dL — ABNORMAL HIGH (ref 65–99)
Glucose-Capillary: 120 mg/dL — ABNORMAL HIGH (ref 65–99)
Glucose-Capillary: 120 mg/dL — ABNORMAL HIGH (ref 65–99)
Glucose-Capillary: 268 mg/dL — ABNORMAL HIGH (ref 65–99)
Glucose-Capillary: 197 mg/dL — ABNORMAL HIGH (ref 65–99)

## 2017-09-06 LAB — MAGNESIUM: MAGNESIUM: 1.5 mg/dL — AB (ref 1.7–2.4)

## 2017-09-06 LAB — PHOSPHORUS: PHOSPHORUS: 3.3 mg/dL (ref 2.5–4.6)

## 2017-09-06 LAB — PROCALCITONIN: Procalcitonin: 0.14 ng/mL

## 2017-09-06 SURGERY — IRRIGATION AND DEBRIDEMENT EXTREMITY
Anesthesia: General | Site: Hip | Laterality: Right

## 2017-09-06 MED ORDER — ONDANSETRON HCL 4 MG/2ML IJ SOLN
INTRAMUSCULAR | Status: DC | PRN
  Administered 2017-09-06: 4 mg via INTRAVENOUS

## 2017-09-06 MED ORDER — LIDOCAINE HCL (CARDIAC) 20 MG/ML IV SOLN
INTRAVENOUS | Status: DC | PRN
  Administered 2017-09-06: 50 mg via INTRAVENOUS

## 2017-09-06 MED ORDER — PROPOFOL 10 MG/ML IV BOLUS
INTRAVENOUS | Status: AC
  Filled 2017-09-06: qty 20

## 2017-09-06 MED ORDER — SODIUM CHLORIDE 0.9 % IR SOLN
Status: DC | PRN
  Administered 2017-09-06: 3000 mL

## 2017-09-06 MED ORDER — FENTANYL CITRATE (PF) 250 MCG/5ML IJ SOLN
INTRAMUSCULAR | Status: AC
  Filled 2017-09-06: qty 5

## 2017-09-06 MED ORDER — FENTANYL CITRATE (PF) 100 MCG/2ML IJ SOLN
INTRAMUSCULAR | Status: DC | PRN
  Administered 2017-09-06: 50 ug via INTRAVENOUS
  Administered 2017-09-06: 25 ug via INTRAVENOUS

## 2017-09-06 MED ORDER — ROCURONIUM BROMIDE 100 MG/10ML IV SOLN
INTRAVENOUS | Status: DC | PRN
  Administered 2017-09-06: 40 mg via INTRAVENOUS

## 2017-09-06 MED ORDER — SUGAMMADEX SODIUM 200 MG/2ML IV SOLN
INTRAVENOUS | Status: DC | PRN
  Administered 2017-09-06: 120.4 mg via INTRAVENOUS

## 2017-09-06 MED ORDER — LACTATED RINGERS IV SOLN
INTRAVENOUS | Status: DC | PRN
  Administered 2017-09-06 (×2): via INTRAVENOUS

## 2017-09-06 MED ORDER — PROPOFOL 10 MG/ML IV BOLUS
INTRAVENOUS | Status: DC | PRN
  Administered 2017-09-06: 20 mg via INTRAVENOUS
  Administered 2017-09-06: 70 mg via INTRAVENOUS

## 2017-09-06 MED ORDER — FREE WATER
30.0000 mL | Status: DC
  Administered 2017-09-06 – 2017-09-08 (×11): 30 mL

## 2017-09-06 MED ORDER — PHENYLEPHRINE 40 MCG/ML (10ML) SYRINGE FOR IV PUSH (FOR BLOOD PRESSURE SUPPORT)
PREFILLED_SYRINGE | INTRAVENOUS | Status: AC
  Filled 2017-09-06: qty 40

## 2017-09-06 MED ORDER — SODIUM CHLORIDE 0.9 % IV SOLN
INTRAVENOUS | Status: DC
  Administered 2017-09-06 – 2017-09-10 (×6): via INTRAVENOUS

## 2017-09-06 MED ORDER — PHENYLEPHRINE HCL 10 MG/ML IJ SOLN
INTRAMUSCULAR | Status: DC | PRN
  Administered 2017-09-06: 120 ug via INTRAVENOUS
  Administered 2017-09-06: 80 ug via INTRAVENOUS
  Administered 2017-09-06 (×2): 120 ug via INTRAVENOUS

## 2017-09-06 MED ORDER — MAGNESIUM SULFATE 2 GM/50ML IV SOLN
2.0000 g | Freq: Once | INTRAVENOUS | Status: AC
  Administered 2017-09-06: 2 g via INTRAVENOUS
  Filled 2017-09-06: qty 50

## 2017-09-06 MED ORDER — PHENYLEPHRINE HCL 10 MG/ML IJ SOLN
INTRAMUSCULAR | Status: DC | PRN
  Administered 2017-09-06: 75 ug/min via INTRAVENOUS

## 2017-09-06 SURGICAL SUPPLY — 51 items
BANDAGE ACE 3X5.8 VEL STRL LF (GAUZE/BANDAGES/DRESSINGS) IMPLANT
BLADE SURG 10 STRL SS (BLADE) ×3 IMPLANT
BNDG COHESIVE 4X5 TAN STRL (GAUZE/BANDAGES/DRESSINGS) ×1 IMPLANT
BNDG COHESIVE 6X5 TAN STRL LF (GAUZE/BANDAGES/DRESSINGS) ×2 IMPLANT
COVER SURGICAL LIGHT HANDLE (MISCELLANEOUS) ×3 IMPLANT
CUFF TOURNIQUET SINGLE 18IN (TOURNIQUET CUFF) ×1 IMPLANT
CUFF TOURNIQUET SINGLE 34IN LL (TOURNIQUET CUFF) IMPLANT
DRAPE ORTHO SPLIT 77X108 STRL (DRAPES) ×6
DRAPE SURG 17X23 STRL (DRAPES) IMPLANT
DRAPE SURG ORHT 6 SPLT 77X108 (DRAPES) ×2 IMPLANT
DRAPE U-SHAPE 47X51 STRL (DRAPES) ×3 IMPLANT
DRSG AQUACEL AG ADV 3.5X 6 (GAUZE/BANDAGES/DRESSINGS) ×2 IMPLANT
DRSG TEGADERM 4X4.75 (GAUZE/BANDAGES/DRESSINGS) ×4 IMPLANT
DURAPREP 26ML APPLICATOR (WOUND CARE) ×3 IMPLANT
ELECT REM PT RETURN 9FT ADLT (ELECTROSURGICAL) ×3
ELECTRODE REM PT RTRN 9FT ADLT (ELECTROSURGICAL) IMPLANT
GAUZE SPONGE 4X4 12PLY STRL (GAUZE/BANDAGES/DRESSINGS) ×3 IMPLANT
GAUZE XEROFORM 1X8 LF (GAUZE/BANDAGES/DRESSINGS) ×3 IMPLANT
GLOVE BIO SURGEON STRL SZ8 (GLOVE) ×3 IMPLANT
GLOVE BIOGEL PI IND STRL 8 (GLOVE) ×2 IMPLANT
GLOVE BIOGEL PI INDICATOR 8 (GLOVE) ×6
GLOVE ORTHO TXT STRL SZ7.5 (GLOVE) ×7 IMPLANT
GOWN STRL REUS W/ TWL LRG LVL3 (GOWN DISPOSABLE) ×1 IMPLANT
GOWN STRL REUS W/ TWL XL LVL3 (GOWN DISPOSABLE) ×2 IMPLANT
GOWN STRL REUS W/TWL LRG LVL3 (GOWN DISPOSABLE) ×3
GOWN STRL REUS W/TWL XL LVL3 (GOWN DISPOSABLE) ×6
HANDPIECE INTERPULSE COAX TIP (DISPOSABLE) ×3
KIT BASIN OR (CUSTOM PROCEDURE TRAY) ×3 IMPLANT
KIT ROOM TURNOVER OR (KITS) ×3 IMPLANT
MANIFOLD NEPTUNE II (INSTRUMENTS) ×3 IMPLANT
NS IRRIG 1000ML POUR BTL (IV SOLUTION) ×3 IMPLANT
PACK ORTHO EXTREMITY (CUSTOM PROCEDURE TRAY) ×3 IMPLANT
PAD ARMBOARD 7.5X6 YLW CONV (MISCELLANEOUS) ×6 IMPLANT
PADDING CAST ABS 4INX4YD NS (CAST SUPPLIES) ×4
PADDING CAST ABS COTTON 4X4 ST (CAST SUPPLIES) ×2 IMPLANT
PADDING CAST COTTON 6X4 STRL (CAST SUPPLIES) ×3 IMPLANT
SET HNDPC FAN SPRY TIP SCT (DISPOSABLE) IMPLANT
SPONGE LAP 18X18 X RAY DECT (DISPOSABLE) ×3 IMPLANT
STOCKINETTE IMPERVIOUS 9X36 MD (GAUZE/BANDAGES/DRESSINGS) ×1 IMPLANT
SUT ETHILON 2 0 FS 18 (SUTURE) ×4 IMPLANT
SUT ETHILON 3 0 PS 1 (SUTURE) IMPLANT
SUT VIC AB 0 CT1 27 (SUTURE) ×3
SUT VIC AB 0 CT1 27XBRD ANBCTR (SUTURE) IMPLANT
SUT VIC AB 2-0 FS1 27 (SUTURE) ×2 IMPLANT
SWAB CULTURE ESWAB REG 1ML (MISCELLANEOUS) ×2 IMPLANT
TOWEL OR 17X24 6PK STRL BLUE (TOWEL DISPOSABLE) ×3 IMPLANT
TOWEL OR 17X26 10 PK STRL BLUE (TOWEL DISPOSABLE) ×3 IMPLANT
TUBE CONNECTING 12'X1/4 (SUCTIONS) ×1
TUBE CONNECTING 12X1/4 (SUCTIONS) ×2 IMPLANT
UNDERPAD 30X30 (UNDERPADS AND DIAPERS) ×1 IMPLANT
YANKAUER SUCT BULB TIP NO VENT (SUCTIONS) ×5 IMPLANT

## 2017-09-06 NOTE — Anesthesia Postprocedure Evaluation (Signed)
Anesthesia Post Note  Patient: [name]  Procedure(s) Performed: IRRIGATION AND DEBRIDEMENT HIP (Right Hip)     Patient location during evaluation: PACU Anesthesia Type: General Level of consciousness: awake and confused Pain management: pain level controlled Vital Signs Assessment: post-procedure vital signs reviewed and stable Respiratory status: spontaneous breathing, nonlabored ventilation, respiratory function stable and patient connected to nasal cannula oxygen Cardiovascular status: blood pressure returned to baseline and stable Postop Assessment: no apparent nausea or vomiting Anesthetic complications: no    Last Vitals:  Vitals:   09/06/17 1743 09/06/17 1959  BP: (!) 117/55 (!) 110/51  Pulse: 87 87  Resp: 18 18  Temp:  37 C  SpO2: 99% 98%    Last Pain:  Vitals:   09/06/17 1959  TempSrc: Oral  PainSc:                  Kenyata Napier

## 2017-09-06 NOTE — Brief Op Note (Signed)
09/06/2017  8:34 AM  PATIENT:  [name] [name]  76 y.o. female  PRE-OPERATIVE DIAGNOSIS:  left hip infection  POST-OPERATIVE DIAGNOSIS:  left hip infection  PROCEDURE:  Procedure(s): IRRIGATION AND DEBRIDEMENT HIP (Right)  SURGEON:  Surgeon(s) and Role:    Mcarthur Rossetti, MD - Primary  PHYSICIAN ASSISTANT: Benita Stabile, PA-C  ANESTHESIA:   general  EBL:  Less than 100 cc  COUNTS:  YES  DICTATION: .Other Dictation: Dictation Number [id number]  PLAN OF CARE: Admit to inpatient   PATIENT DISPOSITION:  PACU - guarded condition.   Delay start of Pharmacological VTE agent (>24hrs) due to surgical blood loss or risk of bleeding: no

## 2017-09-06 NOTE — Progress Notes (Signed)
Patient ID: [name] [name], female   DOB: 23-Mar-1941, 76 y.o.   MRN: [id number] I have spoken with her husband and he understands fully the need to proceed to surgery this morning to wash out her right hip.  The risks and benefits of surgery have been discussed in detail and informed consent is obtained.

## 2017-09-06 NOTE — Progress Notes (Signed)
Report given to OR nurse on phone and at bedisde. BG checked prior to transport and was 156. 2 units given per sliding scale. Pt remains NPO and tube feed off since midnight. Husband in room and will transport with pt.

## 2017-09-06 NOTE — Anesthesia Procedure Notes (Signed)
Procedure Name: Intubation Date/Time: 09/06/2017 7:43 AM Performed by: Clearnce Sorrel, CRNA Pre-anesthesia Checklist: Patient identified, Emergency Drugs available, Suction available, Patient being monitored and Timeout performed Patient Re-evaluated:Patient Re-evaluated prior to induction Oxygen Delivery Method: Circle system utilized Preoxygenation: Pre-oxygenation with 100% oxygen Induction Type: IV induction Ventilation: Mask ventilation without difficulty Laryngoscope Size: Mac and 3 Grade View: Grade II Tube type: Oral Tube size: 7.0 mm Number of attempts: 1 Airway Equipment and Method: Stylet Placement Confirmation: ETT inserted through vocal cords under direct vision,  positive ETCO2 and breath sounds checked- equal and bilateral Secured at: 22 cm Tube secured with: Tape Dental Injury: Teeth and Oropharynx as per pre-operative assessment

## 2017-09-06 NOTE — Plan of Care (Signed)
  Safety: Ability to remain free from injury will improve. Fall risk bundle in pace. No falls or other injuries this shift.  09/06/2017 0802 - Progressing by Mellissa Kohut, RN   Skin Integrity: Risk for impaired skin integrity will decrease. No new signs of skin breakdown this shift. Pt repositioned every 2 hours. Allevyn foam pad place on sacrum and bilateral heels. Bed rails padded for seizure precaution.  09/06/2017 2336 - Progressing by Mellissa Kohut, RN

## 2017-09-06 NOTE — Transfer of Care (Signed)
Immediate Anesthesia Transfer of Care Note  Patient: [name] [name]  Procedure(s) Performed: IRRIGATION AND DEBRIDEMENT HIP (Right Hip)  Patient Location: PACU  Anesthesia Type:General  Level of Consciousness: awake  Airway & Oxygen Therapy: Patient Spontanous Breathing  Post-op Assessment: Report given to RN and Post -op Vital signs reviewed and stable  Post vital signs: Reviewed and stable  Last Vitals:  Vitals:   09/06/17 0858 09/06/17 0859  BP: 114/76   Pulse: 98 98  Resp:  15  Temp:    SpO2: 97% 97%    Last Pain:  Vitals:   09/06/17 0416  TempSrc: Axillary  PainSc:          Complications: No apparent anesthesia complications

## 2017-09-06 NOTE — Progress Notes (Signed)
PROGRESS NOTE  [name] [name] MVH:[id number] DOB: [birth year] DOA: 08/26/2017 PCP: [name], MD  HPI/Recap of past 24 hours: Pt had irrigation and debridement by gen surgery this morning. Incision site looks clean with bandage covering and pig tail drain in place. Pt is somnolent. Does not follow commands. Pupils are round and reactive to light. Does not appear in distress.  Assessment/Plan: Active Problems:   GERD (gastroesophageal reflux disease)   DM type 2 (diabetes mellitus, type 2) (HCC)   HTN (hypertension)   CKD (chronic kidney disease) stage 3, GFR 30-59 ml/min (HCC)   Acute encephalopathy   Seizure disorder, grand mal ([place])   UTI (urinary tract infection)   Sepsis, unspecified organism ([place])   Status epilepticus ([place])   S/P right hip fracture   Postoperative wound infection of right hip  Code Status:Full  Family Communication:No family members at her bedside.   Disposition Plan:Will stay another midnight to continue current treatment.   Consultants:  Neurology  Procedures:  Continuous video-EEG monitoring  Antimicrobials:  None   DVT prophylaxis: Hep sq 5000 U TID   Objective: Vitals:   09/06/17 0912 09/06/17 0927 09/06/17 0930 09/06/17 0942  BP: 114/75 130/60  (!) 128/43  Pulse: (!) 104 96 95 (!) 102  Resp: (!) 28 18 18 16   Temp:    (!) 97.5 F (36.4 C)  TempSrc:      SpO2: 97% 100% 100% 100%  Weight:      Height:        Intake/Output Summary (Last 24 hours) at 09/06/2017 1059 Last data filed at 09/06/2017 0928 Gross per 24 hour  Intake 3047.08 ml  Output 1200 ml  Net 1847.08 ml   Filed Weights   09/03/17 0421 09/04/17 0336 09/06/17 0416  Weight: 60.4 kg (133 lb 1.6 oz) 59.9 kg (132 lb) 60.2 kg (132 lb 12.8 oz)    Exam:  General:eyes open, but minimally follows commands.somnolent Neck: No lymphadenopathy or JVD Lungs: Mild rhonchi at bases Cardiovascular:Regular rate and rhythm without murmur gallop or rub normal  S1 and S2 Abdomen: negative abdominal pain, nondistended, positive bowel sounds x4 Extremities:Right hip at site of incision has purulent discharge Skin:No acute lesions Psychiatric:Unable to evaluate secondary to altered mental status Central nervous system: Unable to do a neurological exam as the pt does not follow commands.    Data Reviewed: CBC: Recent Labs  Lab 09/01/17 0523 09/02/17 0306 09/04/17 1010 09/05/17 0534 09/06/17 0237  WBC 11.5* 7.9 17.6* 18.4* 25.5*  HGB 10.3* 10.2* 9.1* 8.7* 9.1*  HCT 32.9* 33.4* 29.9* 29.0* 29.7*  MCV 100.3* 102.1* 103.1* 102.8* 102.8*  PLT 228 228 370 392 841*   Basic Metabolic Panel: Recent Labs  Lab 09/02/17 0306  09/02/17 1642 09/03/17 0333 09/04/17 0405 09/05/17 0534 09/06/17 0237  NA 142  --   --  144 143 142 143  K 4.3  --   --  4.4 4.9 5.0 4.1  CL 105  --   --  110 108 107 108  CO2 32  --   --  26 24 28 26   GLUCOSE 193*  --   --  232* 421* 312* 47*  BUN <5*  --   --  <5* 8 8 13   CREATININE 1.00  --   --  1.06* 1.21* 1.10* 1.38*  CALCIUM 8.4*  --   --  8.4* 8.2* 8.3* 8.7*  MG 1.2*   < > 1.7 1.5* 1.3* 1.0* 1.5*  PHOS  --    < >  2.1* 2.3* 2.2* 2.4* 3.3   < > = values in this interval not displayed.   GFR: Estimated Creatinine Clearance: 29.9 mL/min (A) (by C-G formula based on SCr of 1.38 mg/dL (H)). Liver Function Tests: Recent Labs  Lab 09/02/17 0306  AST 17  ALT 7*  ALKPHOS 88  BILITOT 0.5  PROT 5.3*  ALBUMIN 1.8*   No results for input(s): LIPASE, AMYLASE in the last 168 hours. Recent Labs  Lab 09/01/17 1028 09/02/17 0306 09/04/17 1625 09/05/17 0205  AMMONIA 60* 67* 44* 25   Coagulation Profile: No results for input(s): INR, PROTIME in the last 168 hours. Cardiac Enzymes: No results for input(s): CKTOTAL, CKMB, CKMBINDEX, TROPONINI in the last 168 hours. BNP (last 3 results) No results for input(s): PROBNP in the last 8760 hours. HbA1C: No results for input(s): HGBA1C in the last 72  hours. CBG: Recent Labs  Lab 09/05/17 2112 09/05/17 2205 09/06/17 0027 09/06/17 0548 09/06/17 0859  GLUCAP 316* 360* 288* 156* 120*   Lipid Profile: No results for input(s): CHOL, HDL, LDLCALC, TRIG, CHOLHDL, LDLDIRECT in the last 72 hours. Thyroid Function Tests: No results for input(s): TSH, T4TOTAL, FREET4, T3FREE, THYROIDAB in the last 72 hours. Anemia Panel: No results for input(s): VITAMINB12, FOLATE, FERRITIN, TIBC, IRON, RETICCTPCT in the last 72 hours. Urine analysis:    Component Value Date/Time   COLORURINE YELLOW 08/26/2017 0345   APPEARANCEUR CLEAR 08/26/2017 0345   LABSPEC 1.015 08/26/2017 0345   PHURINE 6.0 08/26/2017 0345   GLUCOSEU 50 (A) 08/26/2017 0345   HGBUR NEGATIVE 08/26/2017 0345   BILIRUBINUR NEGATIVE 08/26/2017 0345   BILIRUBINUR neg 08/16/2012 1531   KETONESUR 5 (A) 08/26/2017 0345   PROTEINUR 30 (A) 08/26/2017 0345   UROBILINOGEN 0.2 06/25/2013 1002   NITRITE NEGATIVE 08/26/2017 0345   LEUKOCYTESUR TRACE (A) 08/26/2017 0345   Sepsis Labs: @LABRCNTIP (procalcitonin:4,lacticidven:4)  ) Recent Results (from the past 240 hour(s))  CSF culture with Stat gram stain     Status: None   Collection Time: 08/27/17  3:44 PM  Result Value Ref Range Status   Specimen Description CSF  Final   Special Requests Normal  Final   Gram Stain   Final    WBC PRESENT, PREDOMINANTLY MONONUCLEAR NO ORGANISMS SEEN CYTOSPIN SMEAR    Culture NO GROWTH 3 DAYS  Final   Report Status 08/30/2017 FINAL  Final  Aerobic/Anaerobic Culture (surgical/deep wound)     Status: None (Preliminary result)   Collection Time: 09/06/17  8:07 AM  Result Value Ref Range Status   Specimen Description TISSUE HIP RIGHT  Final   Special Requests SPEC A ON SWABS  Final   Gram Stain   Final    FEW WBC PRESENT,BOTH PMN AND MONONUCLEAR RARE GRAM POSITIVE COCCI IN PAIRS IN CLUSTERS    Culture PENDING  Incomplete   Report Status PENDING  Incomplete      Studies: No results  found.  Scheduled Meds: . aspirin  325 mg Per Tube Daily  . atorvastatin  10 mg Per Tube Daily  . chlorhexidine  15 mL Mouth Rinse BID  . heparin  5,000 Units Subcutaneous Q8H  . insulin aspart  0-9 Units Subcutaneous Q4H  . lactulose  10 g Per Tube TID  . levETIRAcetam  500 mg Per Tube BID  . mouth rinse  15 mL Mouth Rinse q12n4p  . metoprolol tartrate  5 mg Intravenous Q6H  . pantoprazole sodium  40 mg Per Tube Daily    Continuous Infusions: . 0.9 %  NaCl with KCl 40 mEq / L 75 mL/hr (09/06/17 0130)  . feeding supplement (JEVITY 1.2 CAL) Stopped (09/06/17 0000)  . magnesium sulfate 1 - 4 g bolus IVPB    . piperacillin-tazobactam (ZOSYN)  IV 3.375 g (09/06/17 7824)  . vancomycin       LOS: 11 days   Seizure disorder -Neurology following -No seizures on continuous EEG -Keppra 500 mg BIDvia NG tube  Sepsis 2/2 to suspected prosthetic joint infection vs others -wbc 25 from 18; possibly a component of reactive leukocytosis and infective process;  -procalcitonin trending down to 0.14 from 0.22 -IV van and IV zosyn day#1 -wound culture, blood culture x2 -mrsa negative 07/28/17  Encephalopathy, most likely multifactorial -hyperammonemia; on lactulose vs sepsis - LP 10/25 per Neuro unrevealing  -24 hour EEG with no recurrent seizures  Chronic Diastolic CHF -Strict in and out -Daily weight -Metoprolol  Essential HTN -Metoprolol  CAD -ASA, lipitor, lopressor  CKD stage III -stable -Avoid nephrotoxic meds -Hydration  Hypokalemia -Potassium goal>4 -Resolved  Hypomagnesemia -Magnesium goal> 2 -Magnesium IV 2 g   RIGHT Hip Fracture -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  Diabetes Type 2 controlled with renal complication / Hypoglycemia A1C=6.5 (08/26/17) -suffered an episode of hypoglycemia during this hospitalization. Currently CBG well controlled  Severe protein calorie malnutrition in context of chronic illness -CorTrak tube  placed 10/31. Tube feeds begun will slowly titrate up. -GI consulted for possible peg tube placement -Keep head of bed above 35 degrees angle -aspiration precaution  Severe deconditioning/weakness -Nutrition and PT/OT when more stable     Kayleen Memos, MD [facility] Pager [phone]  If 7PM-7AM, please contact night-coverage [email or web address] Password [email or web address] 09/06/2017, 10:59 AM

## 2017-09-06 NOTE — Anesthesia Preprocedure Evaluation (Addendum)
Anesthesia Evaluation  Patient identified by MRN, date of birth, ID band Patient confused    Reviewed: Allergy & Precautions, NPO status , Patient's Chart, lab work & pertinent test results, reviewed documented beta blocker date and time , Unable to perform ROS - Chart review only  History of Anesthesia Complications Negative for: history of anesthetic complications  Airway Mallampati: III  TM Distance: >3 FB Neck ROM: Full    Dental  (+) Dental Advisory Given   Pulmonary    breath sounds clear to auscultation       Cardiovascular hypertension, Pt. on medications and Pt. on home beta blockers (-) angina+ Peripheral Vascular Disease  (-) Past MI and (-) CHF  Rhythm:Regular Rate:Tachycardia     Neuro/Psych  Headaches, Seizures -,  PSYCHIATRIC DISORDERS Depression TIACVA    GI/Hepatic Neg liver ROS, GERD  ,  Endo/Other  diabetes  Renal/GU CRFRenal disease     Musculoskeletal  (+) Arthritis ,   Abdominal   Peds  Hematology  (+) anemia ,   Anesthesia Other Findings   Reproductive/Obstetrics                             Anesthesia Physical Anesthesia Plan  ASA: IV  Anesthesia Plan: General   Post-op Pain Management:    Induction:   PONV Risk Score and Plan: 3 and Treatment may vary due to age or medical condition, Ondansetron and Dexamethasone  Airway Management Planned: Oral ETT  Additional Equipment: None  Intra-op Plan: Utilization of Controlled Hypotension per surrgeon request  Post-operative Plan: Extubation in OR and Possible Post-op intubation/ventilation  Informed Consent: I have reviewed the patients History and Physical, chart, labs and discussed the procedure including the risks, benefits and alternatives for the proposed anesthesia with the patient or authorized representative who has indicated his/her understanding and acceptance.   Dental advisory given and Consent  reviewed with POA  Plan Discussed with: CRNA and Surgeon  Anesthesia Plan Comments:        Anesthesia Quick Evaluation

## 2017-09-06 NOTE — Op Note (Signed)
NAMEJERINE, [name] [name]                 ACCOUNT NO.:  [id number]  MEDICAL RECORD NO.:  [id number]  LOCATION:  [facility]                         FACILITY:  [facility]  PHYSICIAN:  Lind Guest. Ninfa Linden, M.D.DATE OF BIRTH:  19-Nov-1940  DATE OF PROCEDURE:  09/06/2017 DATE OF DISCHARGE:                              OPERATIVE REPORT   POSTOPERATIVE DIAGNOSIS:  Right hip wound infection.  POSTOPERATIVE DIAGNOSIS:  Right hip wound infection.  PROCEDURE:  Irrigation and debridement of right hip superficial and deep tissues with sharp excisional debridement of a small area of necrotic skin, fascia, and adipose tissue.  FINDINGS:  Evidence of a large seroma and adipose tissue and necrosis with no significant purulence and no penetration deep to the joint.  SURGEON:  Lind Guest. Ninfa Linden, M.D.  ASSISTANT:  Erskine Emery, PA-C.  ANESTHESIA:  General.  BLOOD LOSS:  Less than 100 mL.  COMPLICATIONS:  None.  INDICATIONS:  Ms. Word is a 76 year old, sickly individual, who in late September of this year, had a mechanical fall and sustained a right hip fracture.  She was taken to the operating room and underwent a right total hip arthroplasty.  This was uncomplicated.  She then went to a nursing home.  I did see her 2 weeks postoperatively and her wound looked normal.  There was just a slight seroma, which I was able to drain easily, but there was no evidence of infection at all.  There is no redness and we removed her sutures easily and placed Steri-Strips. Apparently, she was admitted to the hospital a month later on August 26, 2017, with seizure activity.  She has been in the hospital now for 11 days and I was consulted yesterday due to a small opening at the top of her right hip incision that had some drainage.  I was able to express what I felt potentially was purulent material, but it was a lot of serous drainage from this area.  Given that, we talked to her husband about proceeding to the  operating room for open irrigation debridement to assess the joint to see if there was a deeper infection.  The risks and benefits were explained to her and her husband in detail and he did provide informed consent.  PROCEDURE DESCRIPTION:  After informed consent was obtained, appropriate right hip was marked.  She was brought to the operating room where general anesthesia was obtained while she was on her stretcher.  She was then placed supine on the Hana fracture table with the perineal post in place and both legs in InLine skeletal traction devices, but no traction applied.  We did pre scrub around her right hip and then the right hip was prepped and draped with DuraPrep and sterile drapes.  Time-out was called.  She was identified as correct patient and correct right hip.  I then used a #10 blade.  I opened up her incision just at the proximal aspect of it and was able to sharply excise the scar and a small opening with fibrous tissue and evidence of seroma.  We suctioned out the seroma.  We did send off cultures and gram stain.  There was no gross purulence and  no significant necrotic tissue.  I was able to sharply excise some necrotic adipose tissue.  There was no penetration to the deep tissues at all.  I then irrigated 3 L normal saline solution through the soft tissue that was proximal to the deep tensor fascia. Once I cleaned that out completely, I was able to place a large spinal needle down into the hip joint and got no fluid out of the hip joint, so I decided not to open the joint at all.  The fascia tissue had healed over completely since it has been 6 weeks since her initial hip replacement.  I then placed a medium Hemovac deeply and closed the deep tissue with 0 Vicryl followed by 2 Vicryl subcutaneous tissue and interrupted nylon on the skin.  Xeroform and Aquacel dressings were applied.  She was awakened, extubated, and taken to the recovery room in guarded condition.   All final counts were correct.  There were no complications noted.  Postoperatively, she is already on antibiotics and we will follow these cultures to see if she needs more long-term antibiotics or not, but I was confident there was not a deep infection going on in her right hip.     Lind Guest. Ninfa Linden, M.D.     CYB/MEDQ  D:  09/06/2017  T:  09/06/2017  Job:  [id number]

## 2017-09-07 ENCOUNTER — Encounter (HOSPITAL_COMMUNITY): Payer: Self-pay | Admitting: Orthopaedic Surgery

## 2017-09-07 ENCOUNTER — Other Ambulatory Visit: Payer: Self-pay

## 2017-09-07 ENCOUNTER — Ambulatory Visit (INDEPENDENT_AMBULATORY_CARE_PROVIDER_SITE_OTHER): Payer: Federal, State, Local not specified - PPO | Admitting: Orthopaedic Surgery

## 2017-09-07 DIAGNOSIS — R197 Diarrhea, unspecified: Secondary | ICD-10-CM

## 2017-09-07 DIAGNOSIS — R1310 Dysphagia, unspecified: Secondary | ICD-10-CM

## 2017-09-07 DIAGNOSIS — D539 Nutritional anemia, unspecified: Secondary | ICD-10-CM

## 2017-09-07 LAB — GLUCOSE, CAPILLARY
GLUCOSE-CAPILLARY: 242 mg/dL — AB (ref 65–99)
GLUCOSE-CAPILLARY: 232 mg/dL — AB (ref 65–99)
Glucose-Capillary: 233 mg/dL — ABNORMAL HIGH (ref 65–99)
Glucose-Capillary: 278 mg/dL — ABNORMAL HIGH (ref 65–99)
Glucose-Capillary: 290 mg/dL — ABNORMAL HIGH (ref 65–99)

## 2017-09-07 LAB — BASIC METABOLIC PANEL
Anion gap: 9 (ref 5–15)
BUN: 17 mg/dL (ref 6–20)
CHLORIDE: 105 mmol/L (ref 101–111)
CO2: 23 mmol/L (ref 22–32)
CREATININE: 1.4 mg/dL — AB (ref 0.44–1.00)
Calcium: 7.8 mg/dL — ABNORMAL LOW (ref 8.9–10.3)
GFR calc Af Amer: 41 mL/min — ABNORMAL LOW (ref >60)
GFR calc non Af Amer: 35 mL/min — ABNORMAL LOW (ref >60)
GLUCOSE: 251 mg/dL — AB (ref 65–99)
POTASSIUM: 4.1 mmol/L (ref 3.5–5.1)
SODIUM: 137 mmol/L (ref 135–145)

## 2017-09-07 LAB — CBC
HEMATOCRIT: 24 % — AB (ref 36.0–46.0)
HEMOGLOBIN: 7.4 g/dL — AB (ref 12.0–15.0)
MCH: 31.4 pg (ref 26.0–34.0)
MCHC: 30.8 g/dL (ref 30.0–36.0)
MCV: 101.7 fL — AB (ref 78.0–100.0)
Platelets: 491 10*3/uL — ABNORMAL HIGH (ref 150–400)
RBC: 2.36 MIL/uL — AB (ref 3.87–5.11)
RDW: 16 % — ABNORMAL HIGH (ref 11.5–15.5)
WBC: 10.2 10*3/uL (ref 4.0–10.5)

## 2017-09-07 LAB — PROCALCITONIN: Procalcitonin: <0.1 ng/mL

## 2017-09-07 MED ORDER — LACTULOSE 10 GM/15ML PO SOLN
10.0000 g | Freq: Every day | ORAL | Status: DC
  Administered 2017-09-08: 10 g
  Filled 2017-09-07 (×3): qty 15

## 2017-09-07 MED ORDER — METOPROLOL TARTRATE 25 MG/10 ML ORAL SUSPENSION
25.0000 mg | Freq: Two times a day (BID) | ORAL | Status: DC
  Administered 2017-09-07 – 2017-09-09 (×5): 25 mg
  Filled 2017-09-07 (×6): qty 10

## 2017-09-07 MED ORDER — SODIUM CHLORIDE 0.9% FLUSH: 10.0000 mL | INTRAVENOUS | Status: DC | PRN

## 2017-09-07 MED ORDER — INSULIN ASPART 100 UNIT/ML ~~LOC~~ SOLN
0.0000 [IU] | SUBCUTANEOUS | Status: DC
Start: 2017-09-07 — End: 2017-09-11
  Administered 2017-09-07: 3 [IU] via SUBCUTANEOUS
  Administered 2017-09-07 – 2017-09-08 (×2): 5 [IU] via SUBCUTANEOUS
  Administered 2017-09-08 (×2): 3 [IU] via SUBCUTANEOUS
  Administered 2017-09-08: 8 [IU] via SUBCUTANEOUS
  Administered 2017-09-08: 5 [IU] via SUBCUTANEOUS
  Administered 2017-09-09 (×3): 3 [IU] via SUBCUTANEOUS
  Administered 2017-09-10: 2 [IU] via SUBCUTANEOUS
  Administered 2017-09-10 (×4): 3 [IU] via SUBCUTANEOUS
  Administered 2017-09-10: 2 [IU] via SUBCUTANEOUS
  Administered 2017-09-11 (×2): 3 [IU] via SUBCUTANEOUS
  Administered 2017-09-11: 2 [IU] via SUBCUTANEOUS

## 2017-09-07 NOTE — Progress Notes (Signed)
Occupational Therapy Treatment Patient Details Name: [name] [name] MRN: [id number] DOB: 11/13/1940 Today's Date: 09/07/2017    History of present illness Patient is a 76 y/o female who presents with witnessed seizure and now post ictal. Pt on continuous EEG. PMH includes anemia, CKD, depression, GERD, HLD, HTN, SZR, DM, Hip fracture.   OT comments  Pt appears to be more alert and participatory today.  She will follow one step commands ~25% of the time with a delay. She is able to tell me her name and identify her spouse.  She was incontinent of stool and urine - assisted with peri care.  She requires mod A for bed mobility today.     Follow Up Recommendations  SNF    Equipment Recommendations       Recommendations for Other Services      Precautions / Restrictions Precautions Precautions: Fall Precaution Comments: watch HR; continuous EEG       Mobility Bed Mobility Overal bed mobility: Needs Assistance Bed Mobility: Rolling Rolling: Mod assist         General bed mobility comments: Rolls to Lt with mod A, and to right with min A    Transfers                      Balance                                           ADL either performed or assessed with clinical judgement   ADL Overall ADL's : Needs assistance/impaired Eating/Feeding: NPO   Grooming: Wash/dry face;Wash/dry hands;Brushing hair;Maximal assistance;Bed level Grooming Details (indicate cue type and reason): Pt requires hand over hand assist to initiate.  She will then take over task momentarily before becoming distracted and stopping                     Toileting- Water quality scientist and Hygiene: Total assistance;Bed level Toileting - Clothing Manipulation Details (indicate cue type and reason): Pt incontinent of stool and urine.  Assisted with peri care in supine      Functional mobility during ADLs: Moderate assistance(bed mobility )       Vision        Perception     Praxis      Cognition Arousal/Alertness: Awake/alert Behavior During Therapy: Flat affect Overall Cognitive Status: Impaired/Different from baseline Area of Impairment: Attention;Orientation;Following commands                 Orientation Level: Disoriented to;Time;Situation;Place Current Attention Level: Focused   Following Commands: Follows one step commands inconsistently;Follows one step commands with increased time     Problem Solving: Slow processing;Decreased initiation;Difficulty sequencing;Requires verbal cues;Requires tactile cues General Comments: Pt able to tell me her first name and identify her spouse today.  She folllows one step commands with cues ~25% of time.          Exercises     Shoulder Instructions       General Comments      Pertinent Vitals/ Pain       Pain Assessment: Faces Faces Pain Scale: No hurt  Home Living  Prior Functioning/Environment              Frequency  Min 2X/week        Progress Toward Goals  OT Goals(current goals can now be found in the care plan section)  Progress towards OT goals: Progressing toward goals     Plan Discharge plan remains appropriate    Co-evaluation                 AM-PAC PT "6 Clicks" Daily Activity     Outcome Measure   Help from another person eating meals?: Total Help from another person taking care of personal grooming?: A Lot Help from another person toileting, which includes using toliet, bedpan, or urinal?: Total Help from another person bathing (including washing, rinsing, drying)?: Total Help from another person to put on and taking off regular upper body clothing?: Total Help from another person to put on and taking off regular lower body clothing?: Total 6 Click Score: 7    End of Session Equipment Utilized During Treatment: Oxygen  OT Visit Diagnosis: Cognitive communication deficit  (R41.841);Muscle weakness (generalized) (M62.81)   Activity Tolerance Patient tolerated treatment well   Patient Left in bed;with call bell/phone within reach   Nurse Communication Mobility status        Time: 6568-1275 OT Time Calculation (min): 24 min  Charges: OT General Charges $OT Visit: 1 Visit OT Treatments $Self Care/Home Management : 23-37 mins  [facility], OTR/L [phone]    Lucille Passy M 09/07/2017, 3:27 PM

## 2017-09-07 NOTE — Progress Notes (Signed)
CSW following for disposition. Indication is for SNF, though patient is out of Medicare coverage days and feeding is through [employer], which will limit facility choices. CSW to discuss with patient's spouse regarding long term care planning. CSW to follow.  Estanislado Emms, [place]

## 2017-09-07 NOTE — Progress Notes (Signed)
Pt hypotensive with MAP in 50's after 3 checks and repositioning. On call hospitalist paged.

## 2017-09-07 NOTE — Progress Notes (Signed)
Peripherally Inserted Central Catheter/Midline Placement  The IV Nurse has discussed with the patient and/or persons authorized to consent for the patient, the purpose of this procedure and the potential benefits and risks involved with this procedure.  The benefits include less needle sticks, lab draws from the catheter, and the patient may be discharged home with the catheter. Risks include, but not limited to, infection, bleeding, blood clot (thrombus formation), and puncture of an artery; nerve damage and irregular heartbeat and possibility to perform a PICC exchange if needed/ordered by physician.  Alternatives to this procedure were also discussed.  Bard Power PICC patient education guide, fact sheet on infection prevention and patient information card has been provided to patient /or left at bedside.    PICC/Midline Placement Documentation     Consent obtained by husband at bedside.   Holley Bouche Renee 09/07/2017, 12:38 PM

## 2017-09-07 NOTE — Progress Notes (Signed)
SLP Cancellation Note  Patient Details Name: [name] [name] MRN: [id number] DOB: Mar 11, 1941   Cancelled treatment:        Pt having procedure earlier this morning when SLP checked. Will continue efforts.   [name] 09/07/2017, 2:10 PM   [name].Ed [employer] [phone]

## 2017-09-07 NOTE — Progress Notes (Addendum)
Pt received fluids throughout the night and bp now 122/61 MAP 77 all other vs wnl. Pt was afebrile and was alert to self. She could only recall her first name. Pt repositioned q2h. No new signs of skin break down or other injuries this shift.

## 2017-09-07 NOTE — Progress Notes (Signed)
Patient ID: [name] [name], female   DOB: [birth year], 76 y.o.   MRN: [id number] Patient stable following surgery to I&D her right hip soft-tissue.  Found superficial infection only (at least not down to the hip prosthesis).  This did show gram pos. Cocci.  Will need likely up to 6 weeks of IV antibiotics once an organism and sensitivities are identified.  Will leave in the hemovac drain today.

## 2017-09-07 NOTE — Progress Notes (Signed)
Daily Rounding Note  09/07/2017, 10:20 AM  LOS: 12 days   SUBJECTIVE:   Chief complaint: trouble swallowing.  Remains cognitively impaired and not safe to swallow.  Tolerating tube feeds and po meds via tube.  Stools are loose with TID lactulose in place,  All doses yesterday were held due to diarrhea.  Received dose this AM.       OBJECTIVE:         Vital signs in last 24 hours:    Temp:  [97.8 F (36.6 C)-98.6 F (37 C)] 97.8 F (36.6 C) (11/05 0700) Pulse Rate:  [87-108] 99 (11/05 0700) Resp:  [16-28] 17 (11/05 0700) BP: (100-132)/(37-67) 110/45 (11/05 0700) SpO2:  [92 %-100 %] 98 % (11/05 0700) Weight:  [58.6 kg (129 lb 3.2 oz)] 58.6 kg (129 lb 3.2 oz) (11/05 0640) Last BM Date: 09/06/17 Filed Weights   09/04/17 0336 09/06/17 0416 09/07/17 0640  Weight: 59.9 kg (132 lb) 60.2 kg (132 lb 12.8 oz) 58.6 kg (129 lb 3.2 oz)   General: pale, chronically ill looking, weak and frail   Heart: RRR Chest: clear bil.  No labored resps Abdomen: soft, active BS.  NT, ND  Rectal:  Staff cleaning pt of diarrhea, had another episode of seeping, brown/clear diarrhea after they rolled her onto clean bed pad. Peri rectal skin is erythematous but no skin breakdown yet.    Extremities: no CCE Neuro/Psych:  Smiles, not verbal.  No tremors.  Moves all 4 limbs.  Not-following commands.    Intake/Output from previous day: 11/04 0701 - 11/05 0700 In: 1546.3 [I.V.:1346.3; IV Piggyback:200] Out: 1900 [Urine:1850; Blood:50]  Intake/Output this shift: No intake/output data recorded.  Lab Results: Recent Labs    09/05/17 0534 09/06/17 0237  WBC 18.4* 25.5*  HGB 8.7* 9.1*  HCT 29.0* 29.7*  PLT 392 545*   BMET Recent Labs    09/05/17 0534 09/06/17 0237 09/07/17 0429  NA 142 143 137  K 5.0 4.1 4.1  CL 107 108 105  CO2 28 26 23   GLUCOSE 312* 47* 251*  BUN 8 13 17   CREATININE 1.10* 1.38* 1.40*  CALCIUM 8.3* 8.7* 7.8*    LFT No results for input(s): PROT, ALBUMIN, AST, ALT, ALKPHOS, BILITOT, BILIDIR, IBILI in the last 72 hours. PT/INR No results for input(s): LABPROT, INR in the last 72 hours. Hepatitis Panel No results for input(s): HEPBSAG, HCVAB, HEPAIGM, HEPBIGM in the last 72 hours.  Studies/Results: No results found.   Scheduled Meds: . aspirin  325 mg Per Tube Daily  . atorvastatin  10 mg Per Tube Daily  . chlorhexidine  15 mL Mouth Rinse BID  . free water  30 mL Per Tube Q4H  . heparin  5,000 Units Subcutaneous Q8H  . insulin aspart  0-9 Units Subcutaneous Q4H  . lactulose  10 g Per Tube TID  . levETIRAcetam  500 mg Per Tube BID  . mouth rinse  15 mL Mouth Rinse q12n4p  . metoprolol tartrate  5 mg Intravenous Q6H  . pantoprazole sodium  40 mg Per Tube Daily   Continuous Infusions: . sodium chloride 75 mL/hr at 09/06/17 1223  . feeding supplement (JEVITY 1.2 CAL) 1,000 mL (09/06/17 1430)  . piperacillin-tazobactam (ZOSYN)  IV 3.375 g (09/07/17 0651)  . vancomycin 750 mg (09/07/17 0830)   PRN Meds:.acetaminophen (TYLENOL) oral liquid 160 mg/5 mL **OR** acetaminophen, ibuprofen, LORazepam, ondansetron **OR** ondansetron (ZOFRAN) IV, sodium chloride flush   ASSESMENT:   *  Dysphagia in pt post 45 minute seizure occurring on day of admission.  Previous CVA and seizure disorder.   SLP swallow eval with cognitive-based swallowing impairment.  Multiple swallow evals, had been ok'd for Purees and thin liquids.   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   *  Hip fracture.  9/25 Right THR.  09/06/17 I and D of right hip "large seroma and adipose tissue and necrosis with no significant purulence and no penetration deep to the joint" Zosyn and Vanc in place  *  06/2015 screening colonoscopy.  Sigmoid diverticulosis and 40mm ascending polyp (path: lymphoid aggregates).    *  Macrocytic anemia.    *  CKD stage 3.    *  Diarrhea with erythema of perirectum.  Lactulose initiated for elevated ammonia level  88 >> 25.  No hx liver disease.     PLAN   *  Palliative care goals of care?  Should family want to continue full support, then IR can place gastrostomy tube.    *  Lowered Lactulose to once daily dosing.     GI will sign off.        Azucena Freed  09/07/2017, 10:20 AM Pager: [phone]

## 2017-09-07 NOTE — Progress Notes (Addendum)
PROGRESS NOTE  [name] [name] KCM:[id number] DOB: 1941-07-31 DOA: 08/26/2017 PCP: [name], MD  HPI/Recap of past 24 hours: Pt seen at her bedside. She is more alert and talking but minimally. Answers simple questions. Denies pain or trouble breathing.  Assessment/Plan: Active Problems:   GERD (gastroesophageal reflux disease)   DM type 2 (diabetes mellitus, type 2) (HCC)   HTN (hypertension)   CKD (chronic kidney disease) stage 3, GFR 30-59 ml/min (HCC)   Acute encephalopathy   Seizure disorder, grand mal ([place])   UTI (urinary tract infection)   Sepsis, unspecified organism ([place])   Status epilepticus ([place])   S/P right hip fracture   Postoperative wound infection of right hip   Code Status:Full  Family Communication:No family members at her bedside.   Disposition Plan:Will stay another midnight to continue current treatment.   Consultants:  Neurology  Procedures:  Continuous video-EEG monitoring  Antimicrobials:  None   DVT prophylaxis: Hep sq 5000 U TID   Objective: Vitals:   09/07/17 0056 09/07/17 0100 09/07/17 0640 09/07/17 0700  BP:   122/61 (!) 110/45  Pulse: 99 (!) 102 97 99  Resp: (!) 25 (!) 22 16 17   Temp:   98.3 F (36.8 C) 97.8 F (36.6 C)  TempSrc:    Axillary  SpO2: 92% 98% 98% 98%  Weight:   58.6 kg (129 lb 3.2 oz)   Height:        Intake/Output Summary (Last 24 hours) at 09/07/2017 0930 Last data filed at 09/06/2017 2028 Gross per 24 hour  Intake 546.25 ml  Output 1650 ml  Net -1103.75 ml   Filed Weights   09/04/17 0336 09/06/17 0416 09/07/17 0640  Weight: 59.9 kg (132 lb) 60.2 kg (132 lb 12.8 oz) 58.6 kg (129 lb 3.2 oz)    Exam:  General:More alert today. Only follows some commands. Talking although minimally.  Neck: No lymphadenopathy or JVD Lungs: Mild rhonchi at bases Cardiovascular:Regular rate and rhythm without murmur gallop or rub normal S1 and S2 Abdomen: negative abdominal pain, nondistended,  positive bowel sounds x4 Extremities:Right hip at site of incision is covered with surgical dressing Skin:No open lesions Psychiatric:Unable to evaluate secondary to altered mental status Central nervous system: Unable to do a complete neurological exam as the pt does not fully follow commands.   Data Reviewed: CBC: Recent Labs  Lab 09/01/17 0523 09/02/17 0306 09/04/17 1010 09/05/17 0534 09/06/17 0237  WBC 11.5* 7.9 17.6* 18.4* 25.5*  HGB 10.3* 10.2* 9.1* 8.7* 9.1*  HCT 32.9* 33.4* 29.9* 29.0* 29.7*  MCV 100.3* 102.1* 103.1* 102.8* 102.8*  PLT 228 228 370 392 056*   Basic Metabolic Panel: Recent Labs  Lab 09/02/17 1642 09/03/17 0333 09/04/17 0405 09/05/17 0534 09/06/17 0237 09/07/17 0429  NA  --  144 143 142 143 137  K  --  4.4 4.9 5.0 4.1 4.1  CL  --  110 108 107 108 105  CO2  --  26 24 28 26 23   GLUCOSE  --  232* 421* 312* 47* 251*  BUN  --  <5* 8 8 13 17   CREATININE  --  1.06* 1.21* 1.10* 1.38* 1.40*  CALCIUM  --  8.4* 8.2* 8.3* 8.7* 7.8*  MG 1.7 1.5* 1.3* 1.0* 1.5*  --   PHOS 2.1* 2.3* 2.2* 2.4* 3.3  --    GFR: Estimated Creatinine Clearance: 29.5 mL/min (A) (by C-G formula based on SCr of 1.4 mg/dL (H)). Liver Function Tests: Recent Labs  Lab 09/02/17 [phone]  AST 17  ALT 7*  ALKPHOS 88  BILITOT 0.5  PROT 5.3*  ALBUMIN 1.8*   No results for input(s): LIPASE, AMYLASE in the last 168 hours. Recent Labs  Lab 09/01/17 1028 09/02/17 0306 09/04/17 1625 09/05/17 0205  AMMONIA 60* 67* 44* 25   Coagulation Profile: No results for input(s): INR, PROTIME in the last 168 hours. Cardiac Enzymes: No results for input(s): CKTOTAL, CKMB, CKMBINDEX, TROPONINI in the last 168 hours. BNP (last 3 results) No results for input(s): PROBNP in the last 8760 hours. HbA1C: No results for input(s): HGBA1C in the last 72 hours. CBG: Recent Labs  Lab 09/06/17 1725 09/06/17 2024 09/07/17 0053 09/07/17 0630 09/07/17 0751  GLUCAP 268* 197* 233* 278* 290*    Lipid Profile: No results for input(s): CHOL, HDL, LDLCALC, TRIG, CHOLHDL, LDLDIRECT in the last 72 hours. Thyroid Function Tests: No results for input(s): TSH, T4TOTAL, FREET4, T3FREE, THYROIDAB in the last 72 hours. Anemia Panel: No results for input(s): VITAMINB12, FOLATE, FERRITIN, TIBC, IRON, RETICCTPCT in the last 72 hours. Urine analysis:    Component Value Date/Time   COLORURINE YELLOW 08/26/2017 0345   APPEARANCEUR CLEAR 08/26/2017 0345   LABSPEC 1.015 08/26/2017 0345   PHURINE 6.0 08/26/2017 0345   GLUCOSEU 50 (A) 08/26/2017 0345   HGBUR NEGATIVE 08/26/2017 0345   BILIRUBINUR NEGATIVE 08/26/2017 0345   BILIRUBINUR neg 08/16/2012 1531   KETONESUR 5 (A) 08/26/2017 0345   PROTEINUR 30 (A) 08/26/2017 0345   UROBILINOGEN 0.2 06/25/2013 1002   NITRITE NEGATIVE 08/26/2017 0345   LEUKOCYTESUR TRACE (A) 08/26/2017 0345   Sepsis Labs: @LABRCNTIP (procalcitonin:4,lacticidven:4)  ) Recent Results (from the past 240 hour(s))  Culture, blood (routine x 2)     Status: None (Preliminary result)   Collection Time: 09/05/17  1:52 AM  Result Value Ref Range Status   Specimen Description BLOOD LEFT HAND  Final   Special Requests   Final    BOTTLES DRAWN AEROBIC AND ANAEROBIC Blood Culture adequate volume   Culture NO GROWTH 1 DAY  Final   Report Status PENDING  Incomplete  Culture, blood (routine x 2)     Status: None (Preliminary result)   Collection Time: 09/05/17  1:52 AM  Result Value Ref Range Status   Specimen Description BLOOD RIGHT ANTECUBITAL  Final   Special Requests   Final    BOTTLES DRAWN AEROBIC AND ANAEROBIC Blood Culture adequate volume   Culture NO GROWTH 1 DAY  Final   Report Status PENDING  Incomplete  Aerobic/Anaerobic Culture (surgical/deep wound)     Status: None (Preliminary result)   Collection Time: 09/06/17  8:07 AM  Result Value Ref Range Status   Specimen Description TISSUE HIP RIGHT  Final   Special Requests SPEC A ON SWABS  Final   Gram Stain    Final    FEW WBC PRESENT,BOTH PMN AND MONONUCLEAR RARE GRAM POSITIVE COCCI IN PAIRS IN CLUSTERS    Culture PENDING  Incomplete   Report Status PENDING  Incomplete      Studies: No results found.  Scheduled Meds: . aspirin  325 mg Per Tube Daily  . atorvastatin  10 mg Per Tube Daily  . chlorhexidine  15 mL Mouth Rinse BID  . free water  30 mL Per Tube Q4H  . heparin  5,000 Units Subcutaneous Q8H  . insulin aspart  0-9 Units Subcutaneous Q4H  . lactulose  10 g Per Tube TID  . levETIRAcetam  500 mg Per Tube BID  . mouth rinse  15 mL Mouth Rinse q12n4p  . metoprolol tartrate  5 mg Intravenous Q6H  . pantoprazole sodium  40 mg Per Tube Daily    Continuous Infusions: . sodium chloride 75 mL/hr at 09/06/17 1223  . feeding supplement (JEVITY 1.2 CAL) 1,000 mL (09/06/17 1430)  . piperacillin-tazobactam (ZOSYN)  IV 3.375 g (09/07/17 0651)  . vancomycin 750 mg (09/07/17 0830)     LOS: 12 days        Altered mental status, most likely multifactorial 2/2 to seizure disorder vs sepsis vs hyperammonemia vs others         -Improving      -Pt able to talk today  Seizure disorder -Neurology following -No seizures on continuous EEG -Keppra 500 mg BIDvia NG tube  Sepsis 2/2 to superficial right hip infection vs others -IV van and IV zosyn  -wound culture, blood culture x2 -mrsa negative 07/28/17  Hyperammonemia -lactulose  -repeat ammonia level - LP 10/25 per Neuro unrevealing  -24 hour EEGwith no recurrent seizures  Chronic Diastolic CHF -Strict in and out -Daily weight -Metoprolol  Essential HTN -Metoprolol peg tube  CAD -ASA, lipitor, lopressor  CKD stage III -stable -Avoid nephrotoxic meds -Hydration  Hypokalemia -Potassium goal>4 -Resolved  Hypomagnesemia -Magnesium goal> 2 -Magnesium IV 2 g   RIGHT Hip Fracture -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  Diabetes Type 2 controlled with renal complication / Hypoglycemia A1C=6.5  (08/26/17) -suffered an episode of hypoglycemia during this hospitalization. Currently CBG well controlled  Severe protein calorie malnutrition in context of chronic illness -CorTrak tube placed 10/31. Tube feeds begun will slowly titrate up. -GI consulted for possible peg tube placement -Keep head of bed above 35 degrees angle -aspiration precaution  Severe deconditioning/weakness -Nutrition and PT/OT when more stable   Kayleen Memos, MD [facility] Pager [phone]  If 7PM-7AM, please contact night-coverage [email or web address] Password [email or web address] 09/07/2017, 9:30 AM

## 2017-09-07 NOTE — Progress Notes (Signed)
PT Cancellation Note  Patient Details Name: [name] [name] MRN: [id number] DOB: Feb 11, 1941   Cancelled Treatment:    Reason Eval/Treat Not Completed: Patient at procedure or test/unavailable Pt undergoing PICC placement. PT to return as able to complete treatment.   Shimon Trowbridge M Caige Almeda 09/07/2017, 11:56 AM   Kittie Plater, PT, DPT Pager #: [phone] Office #: [phone]

## 2017-09-07 NOTE — Progress Notes (Signed)
Inpatient Diabetes Program Recommendations  AACE/ADA: New Consensus Statement on Inpatient Glycemic Control (2015)  Target Ranges:  Prepandial:   less than 140 mg/dL      Peak postprandial:   less than 180 mg/dL (1-2 hours)      Critically ill patients:  140 - 180 mg/dL   Lab Results  Component Value Date   GLUCAP 290 (H) 09/07/2017   HGBA1C 6.5 (H) 08/26/2017    Review of Glycemic Control Results for [name] [name], [name] [name] (MRN [id number]) as of 09/07/2017 11:16  Ref. Range 09/06/2017 17:25 09/06/2017 20:24 09/07/2017 00:53 09/07/2017 06:30 09/07/2017 07:51  Glucose-Capillary Latest Ref Range: 65 - 99 mg/dL 268 (H) 197 (H) 233 (H) 278 (H) 290 (H)   Diabetes history: Type 2 DM Outpatient Diabetes medications: Novolog flexpen 4-9 units tid with meals, Levemir 8 units q HS Current orders for Inpatient glycemic control: Novolog sensitive q 4 hours   Inpatient Diabetes Program Recommendations:  Please consider Levemir 4 units bid and adjust as needed.  Thank you, [name] [name]. [name] Byron, RN, MSN, CDE  Diabetes Coordinator Inpatient Glycemic Control Team Team Pager [phone] (8am-5pm) 09/07/2017 11:18 AM

## 2017-09-08 DIAGNOSIS — Z7189 Other specified counseling: Secondary | ICD-10-CM

## 2017-09-08 DIAGNOSIS — R569 Unspecified convulsions: Secondary | ICD-10-CM

## 2017-09-08 DIAGNOSIS — G40409 Other generalized epilepsy and epileptic syndromes, not intractable, without status epilepticus: Secondary | ICD-10-CM

## 2017-09-08 DIAGNOSIS — Z515 Encounter for palliative care: Secondary | ICD-10-CM

## 2017-09-08 LAB — COMPREHENSIVE METABOLIC PANEL
ALK PHOS: 139 U/L — AB (ref 38–126)
ALT: 10 U/L — AB (ref 14–54)
AST: 22 U/L (ref 15–41)
Albumin: 1.8 g/dL — ABNORMAL LOW (ref 3.5–5.0)
Anion gap: 7 (ref 5–15)
BUN: 14 mg/dL (ref 6–20)
CHLORIDE: 103 mmol/L (ref 101–111)
CO2: 30 mmol/L (ref 22–32)
CREATININE: 1.3 mg/dL — AB (ref 0.44–1.00)
Calcium: 8.1 mg/dL — ABNORMAL LOW (ref 8.9–10.3)
GFR, EST AFRICAN AMERICAN: 45 mL/min — AB (ref >60)
GFR, EST NON AFRICAN AMERICAN: 39 mL/min — AB (ref >60)
Glucose, Bld: 178 mg/dL — ABNORMAL HIGH (ref 65–99)
Potassium: 3.6 mmol/L (ref 3.5–5.1)
SODIUM: 140 mmol/L (ref 135–145)
Total Bilirubin: 0.4 mg/dL (ref 0.3–1.2)
Total Protein: 5.5 g/dL — ABNORMAL LOW (ref 6.5–8.1)

## 2017-09-08 LAB — GLUCOSE, CAPILLARY
GLUCOSE-CAPILLARY: 119 mg/dL — AB (ref 65–99)
GLUCOSE-CAPILLARY: 230 mg/dL — AB (ref 65–99)
Glucose-Capillary: 177 mg/dL — ABNORMAL HIGH (ref 65–99)
Glucose-Capillary: 215 mg/dL — ABNORMAL HIGH (ref 65–99)
Glucose-Capillary: 188 mg/dL — ABNORMAL HIGH (ref 65–99)
Glucose-Capillary: 272 mg/dL — ABNORMAL HIGH (ref 65–99)
Glucose-Capillary: 177 mg/dL — ABNORMAL HIGH (ref 65–99)

## 2017-09-08 LAB — CBC
HCT: 23.9 % — ABNORMAL LOW (ref 36.0–46.0)
HEMOGLOBIN: 7.3 g/dL — AB (ref 12.0–15.0)
MCH: 31.3 pg (ref 26.0–34.0)
MCHC: 30.5 g/dL (ref 30.0–36.0)
MCV: 102.6 fL — AB (ref 78.0–100.0)
Platelets: 509 10*3/uL — ABNORMAL HIGH (ref 150–400)
RBC: 2.33 MIL/uL — AB (ref 3.87–5.11)
RDW: 15.8 % — ABNORMAL HIGH (ref 11.5–15.5)
WBC: 9.4 10*3/uL (ref 4.0–10.5)

## 2017-09-08 LAB — AMMONIA: Ammonia: 21 umol/L (ref 9–35)

## 2017-09-08 NOTE — Progress Notes (Signed)
Patient ID: [name] [name], female   DOB: [birth year], 76 y.o.   MRN: [id number] The hemovac drain was removed this morning due to minimal output.  Her right hip incision is clean and dry with no redness. Given that the infection involved the soft-tissue only and not the joint or the prosthesis, should have a better chance at treating the infection overall.  Can be up from an ortho standpoint with full weight as tolerated -certainly cautious and with assistance given her current medical condition.

## 2017-09-08 NOTE — Progress Notes (Signed)
CSW met with patient's husband, [name], at bedside. Patient disoriented. Husband confirmed conversation with palliative about goals of care. Husband indicated plan is to take patient home, as they will be unable to afford care at a SNF. Husband indicated he is having a hard time making a decision about hospice care, as he doesn't want to think about patient's end of life. CSW validated husband's feelings and offered empathetic and reflective listening. CSW explained that RNCM would support with transition home with services. CSW referred to [facility]. Please re-consult if disposition plan changes. CSW signing off.  Estanislado Emms, [place]

## 2017-09-08 NOTE — Consult Note (Signed)
Consultation Note Date: 09/08/2017   Patient Name: [name] [name]  DOB: November 17, 1940  MRN: [id number]  Age / Sex: 76 y.o., female  PCP: [name] Roers, MD Referring Physician: Kayleen Memos, DO  Reason for Consultation: Establishing goals of care  HPI/Patient Profile: 76 y.o. female  with past medical history of CVA, seizures, confusion, recent hip fracture who was admitted on 08/26/2017 with seizure and sepsis.  She was found to have an infection at the site of her recent hip repair.  She has been followed by Ortho and underwent I & D on 11/4.   Clinical Assessment and Goals of Care:  I have reviewed medical records including EPIC notes, labs and imaging, received report from bedside RN, assessed the patient and then met at the bedside along with her husband  to discuss diagnosis prognosis, GOC, EOL wishes, disposition and options.  I introduced Palliative Medicine as specialized medical care for people living with serious illness. It focuses on providing relief from the symptoms and stress of a serious illness. The goal is to improve quality of life for both the patient and the family.  We discussed a brief life review of the patient. She was a full time mother from [place].  She is [employer] who enjoyed hockey and beading.   Per her husband she has been confused since having a series of seizures approximately 4 years ago.  She had sepsis from UTI in August and really has not be able to ambulate since.  She has not felt like eating or drinking at home despite a lot of encouragement from her family.  Her adult daughter lives at home with the patient and her husband.  We discussed their current illness and what it means in the larger context of their on-going co-morbidities. Specifically we discussed the desire not to eat/drink near end of life.  We discussed aspiration risk and feeding tubes.  [name] [name] answered that his wife  would not want a feeding tube and he did not believe he would want her to have to have one.  We discussed careful hand feeding and risk of aspiration.  [name] [name] understood and would like to have the cor=trak removed today.  We discussed code status.  At this point [name] [name] feels he would not like for [name] [name] to ever be intubated, but he would want a code blue and he would want CPR if it would be helpful.  He expressed several times that "if there was no chance, then I would not want her to go thru it".    Hospice and Palliative Care services outpatient were explained and offered.  [name] [name] is considering Hospice services in the home.   At this point [name] [name] seemed a bit overwhelmed with information.  So a Hard Choices booklet left for review. The family was encouraged to call with questions or concerns and PMT will plan to follow up again 11/7.       Primary Decision Maker:  NEXT OF KIN Husband, [name] [name]    SUMMARY OF RECOMMENDATIONS   Remove  Cor Trak tube.  Family desires careful hand feeding.  Risk of aspiration explained. No PEG tube. Please remove mitts if possible. Will re-discuss [place] and hospice services at home on 11/7.  Code Status/Advance Care Planning:  Partial code.   DNI.  Husband would want Bipap and CPR   Symptom Management:   Please remove mitts if possible.   Patient seems comfortable.   Psycho-social/Spiritual:   Desire for further Chaplaincy support: yes requested.   Prognosis: less than 6 months.  Patient is bed bound.  Poor PO intake.  Altered mental status.  Recurrent sepsis and hospitalization.  Discharge Planning: To Be Determined  Likely home with hospice.      Primary Diagnoses: Present on Admission: . Status epilepticus ([place]) . Acute encephalopathy . CKD (chronic kidney disease) stage 3, GFR 30-59 ml/min (HCC) . GERD (gastroesophageal reflux disease) . HTN (hypertension) . Seizure disorder, grand mal ([place]) . Sepsis, unspecified organism ([place]) . UTI  (urinary tract infection)   I have reviewed the medical record, interviewed the patient and family, and examined the patient. The following aspects are pertinent.  Past Medical History:  Diagnosis Date  . Anemia   . Arthritis    "a touch in my hands" (11/17/2016)  . Chronic back pain   . Chronic kidney disease (CKD), active medical management without dialysis, stage 3 (moderate) ([place])   . Depression   . GERD (gastroesophageal reflux disease)   . High cholesterol   . Hyperlipidemia   . Hypertension   . Seizures ([place])    last seizure 2014 (11/17/2016)  . Stroke [facility]) 2013   Secondary to acute right posterior temporo-occipital intra-axial hemorrhage  . Type II diabetes mellitus ([place])    Social History   Socioeconomic History  . Marital status: Married    Spouse name: None  . Number of children: None  . Years of education: None  . Highest education level: None  Social Needs  . Financial resource strain: None  . Food insecurity - worry: None  . Food insecurity - inability: None  . Transportation needs - medical: None  . Transportation needs - non-medical: None  Occupational History  . Occupation: Retired  Tobacco Use  . Smoking status: Never Smoker  . Smokeless tobacco: Never Used  Substance and Sexual Activity  . Alcohol use: No    Comment: patient drinks caffeinated drinks.   . Drug use: No  . Sexual activity: No  Other Topics Concern  . None  Social History Narrative   Patient lives at home with her husband and daughter and has a high school education.    Family History  Problem Relation Age of Onset  . Cancer Mother   . Colon cancer Mother   . Liver disease Mother   . Breast cancer Mother   . Cancer Father   . Diabetes Paternal Grandmother   . Breast cancer Maternal Aunt   . Breast cancer Maternal Aunt   . Esophageal cancer Neg Hx   . Rectal cancer Neg Hx   . Stomach cancer Neg Hx    Scheduled Meds: . aspirin  325 mg Per Tube Daily  . atorvastatin  10  mg Per Tube Daily  . chlorhexidine  15 mL Mouth Rinse BID  . free water  30 mL Per Tube Q4H  . heparin  5,000 Units Subcutaneous Q8H  . insulin aspart  0-15 Units Subcutaneous Q4H  . lactulose  10 g Per Tube Daily  . levETIRAcetam  500 mg Per Tube  BID  . mouth rinse  15 mL Mouth Rinse q12n4p  . metoprolol tartrate  25 mg Per Tube BID  . pantoprazole sodium  40 mg Per Tube Daily   Continuous Infusions: . sodium chloride 75 mL/hr at 09/08/17 0541  . feeding supplement (JEVITY 1.2 CAL) 1,000 mL (09/07/17 1131)  . piperacillin-tazobactam (ZOSYN)  IV 3.375 g (09/08/17 0541)  . vancomycin Stopped (09/07/17 0930)   PRN Meds:.acetaminophen (TYLENOL) oral liquid 160 mg/5 mL **OR** acetaminophen, ibuprofen, LORazepam, ondansetron **OR** ondansetron (ZOFRAN) IV, sodium chloride flush, sodium chloride flush Allergies  Allergen Reactions  . Barbiturates     Becomes restless.  Also with all "strong" medications like sleeping pills  . Latex Itching   Review of Systems patient unable  Physical Exam  Pleasantly confused female with N/G in place and mitts on CV tachy Resp no distress.  Mildly coarse breath sounds.  Patient does not follow instruction to cough Ext 1+ edema.   Neuro - Uncertain the patient understands verbal commands.  She will copy me if I raise my arm, but will not follow a verbal command to raise her arm.   Does not speak to me.  She will smile.  Vital Signs: BP 126/72 (BP Location: Left Arm)   Pulse (P) 100   Temp (P) 97.9 F (36.6 C) (Axillary)   Resp (P) 19   Ht '5\' 4"'  (1.626 m)   Wt 58.5 kg (129 lb)   SpO2 (P) 99%   BMI 22.14 kg/m  Pain Assessment: No/denies pain POSS *See Group Information*: 2-Acceptable,Slightly drowsy, easily aroused Pain Score: 0-No pain   SpO2: SpO2: (P) 99 % O2 Device:SpO2: (P) 99 % O2 Flow Rate: .O2 Flow Rate (L/min): 2 L/min  IO: Intake/output summary:   Intake/Output Summary (Last 24 hours) at 09/08/2017 1003 Last data filed at  09/08/2017 0600 Gross per 24 hour  Intake 480 ml  Output 2225 ml  Net -1745 ml    LBM: Last BM Date: 09/07/17 Baseline Weight: Weight: 59 kg (130 lb) Most recent weight: Weight: 58.5 kg (129 lb)     Palliative Assessment/Data:20%     Time In: 9:00 Time Out: 10:15 Time Total: 75 min. Greater than 50%  of this time was spent counseling and coordinating care related to the above assessment and plan.  Signed by: Florentina Jenny, PA-C Palliative Medicine Pager: [phone]  Please contact Palliative Medicine Team phone at [phone] for questions and concerns.  For individual provider: See Shea Evans

## 2017-09-08 NOTE — Progress Notes (Signed)
Pharmacy Antibiotic Note  [name] [name] is a 76 y.o. female admitted on 08/26/2017 with prosthetic joint infection.  Pharmacy has been consulted for vancomycin and zosyn dosing. Pt is afebrile and WBC is WNL. SCr is stable.   Plan: Continue vanc 750mg  IV Q24H Continue zosyn 3.375gm IV Q8h (4 hr inf) F/u renal fxn, C&S, clinical status and trough at [facility] MD - Consider de-escalate antibiotics, consider DC vanc   Height: 5\' 4"  (162.6 cm) Weight: 129 lb (58.5 kg) IBW/kg (Calculated) : 54.7  Temp (24hrs), Avg:97.8 F (36.6 C), Min:97.6 F (36.4 C), Max:97.9 F (36.6 C)  Recent Labs  Lab 09/04/17 0405 09/04/17 1010 09/05/17 0204 09/05/17 0533 09/05/17 0534 09/06/17 0237 09/07/17 0429 09/07/17 1019 09/08/17 0630  WBC  --  17.6*  --   --  18.4* 25.5*  --  10.2 9.4  CREATININE 1.21*  --   --   --  1.10* 1.38* 1.40*  --  1.30*  LATICACIDVEN  --   --  1.6 1.7  --   --   --   --   --     Estimated Creatinine Clearance: 31.8 mL/min (A) (by C-G formula based on SCr of 1.3 mg/dL (H)).    Allergies  Allergen Reactions  . Barbiturates     Becomes restless.  Also with all "strong" medications like sleeping pills  . Latex Itching    Antimicrobials this admission: 10/24 rocephin >> 10/25 10/25 vanc >>10/29; 11/3>> 10/25 zosyn >>10/29; 11/3>>  Microbiology results: 10/25 CSF >> NGx2 10/25 urine >> neg 10/24 blood x2 >> NGx3 10/25 MRSA PCR +  10/25 LP not suggestive of meningitis, cx NGx2d 11/3 BCx: ngtd 11/3 right hip tissue: rare GPC pairs/clusters  Salome Arnt, PharmD, BCPS Phone #: [phone] until 3:30pm All other times, call [place] x [phone] 09/08/2017 10:01 AM

## 2017-09-08 NOTE — Progress Notes (Addendum)
PROGRESS NOTE  [name] [name] ZOX:[id number] DOB: 1941/03/17 DOA: 08/26/2017 PCP: [name], MD  HPI/Recap of past 24 hours: No acute events reported overnight. Minimal output from drain. Surgery removed drain. Can ambulate with assistance. Afebrile with no leukocytosis.  The patient's husband changed code status to DO NoT Intubate. He spoke with the palliative team and requested to discontinue NG tube feeding and mittens. He declined peg tube placement and is aware that she may not be able to sustain much longer without feeding.  Assessment/Plan: Active Problems:   GERD (gastroesophageal reflux disease)   DM type 2 (diabetes mellitus, type 2) (HCC)   HTN (hypertension)   CKD (chronic kidney disease) stage 3, GFR 30-59 ml/min (HCC)   Acute encephalopathy   Seizure disorder, grand mal ([place])   UTI (urinary tract infection)   Sepsis, unspecified organism ([place])   Status epilepticus ([place])   S/P right hip fracture   Postoperative wound infection of right hip  Code Status:Do not intubate  Family Communication:Spoke with husband in the room. All questions answered to his satisfaction.  Disposition Plan:Will stay another midnight to continue current treatment.   Consultants:  Neurology  Procedures:  Continuous video-EEG monitoring  Antimicrobials:  None   DVT prophylaxis: Hep sq 5000 U TID     Objective: Vitals:   09/07/17 2300 09/08/17 0338 09/08/17 0429 09/08/17 0700  BP: (!) 122/94  126/72   Pulse: (!) 101 93 95 (P) 100  Resp: 19 14 18  (P) 19  Temp: 97.9 F (36.6 C)   (P) 97.9 F (36.6 C)  TempSrc: Axillary   (P) Axillary  SpO2: 99% 97% 99% (P) 99%  Weight:   58.5 kg (129 lb)   Height:        Intake/Output Summary (Last 24 hours) at 09/08/2017 0859 Last data filed at 09/08/2017 0600 Gross per 24 hour  Intake 2185 ml  Output 2825 ml  Net -640 ml   Filed Weights   09/06/17 0416 09/07/17 0640 09/08/17 0429  Weight: 60.2 kg (132 lb 12.8 oz)  58.6 kg (129 lb 3.2 oz) 58.5 kg (129 lb)    Exam:  General:More alert today. Follows commands. Talking although minimally.  Neck: NolymphadenopathyorJVD Lungs:Mild rhonchi at bases Cardiovascular:Regular rate and rhythm without murmur gallop or rub normal S1 and S2 Abdomen: negative abdominal pain, nondistended, positive bowel soundsx4 Extremities:Right hip at site of incision is covered with surgical dressing Skin:some bruising at needle sites Psychiatric:Mood is appropriate for condition and setting Central nervous system: left lower extremity 5/5 and right lower extremity 4/5. Pupils are reactive to light and accomodate.    Data Reviewed: CBC: Recent Labs  Lab 09/04/17 1010 09/05/17 0534 09/06/17 0237 09/07/17 1019 09/08/17 0630  WBC 17.6* 18.4* 25.5* 10.2 9.4  HGB 9.1* 8.7* 9.1* 7.4* 7.3*  HCT 29.9* 29.0* 29.7* 24.0* 23.9*  MCV 103.1* 102.8* 102.8* 101.7* 102.6*  PLT 370 392 545* 491* 811*   Basic Metabolic Panel: Recent Labs  Lab 09/02/17 1642 09/03/17 0333 09/04/17 0405 09/05/17 0534 09/06/17 0237 09/07/17 0429 09/08/17 0630  NA  --  144 143 142 143 137 140  K  --  4.4 4.9 5.0 4.1 4.1 3.6  CL  --  110 108 107 108 105 103  CO2  --  26 24 28 26 23 30   GLUCOSE  --  232* 421* 312* 47* 251* 178*  BUN  --  <5* 8 8 13 17 14   CREATININE  --  1.06* 1.21* 1.10* 1.38* 1.40* 1.30*  CALCIUM  --  8.4* 8.2* 8.3* 8.7* 7.8* 8.1*  MG 1.7 1.5* 1.3* 1.0* 1.5*  --   --   PHOS 2.1* 2.3* 2.2* 2.4* 3.3  --   --    GFR: Estimated Creatinine Clearance: 31.8 mL/min (A) (by C-G formula based on SCr of 1.3 mg/dL (H)). Liver Function Tests: Recent Labs  Lab 09/02/17 0306 09/08/17 0630  AST 17 22  ALT 7* 10*  ALKPHOS 88 139*  BILITOT 0.5 0.4  PROT 5.3* 5.5*  ALBUMIN 1.8* 1.8*   No results for input(s): LIPASE, AMYLASE in the last 168 hours. Recent Labs  Lab 09/01/17 1028 09/02/17 0306 09/04/17 1625 09/05/17 0205 09/08/17 0630  AMMONIA 60* 67* 44* 25 21    Coagulation Profile: No results for input(s): INR, PROTIME in the last 168 hours. Cardiac Enzymes: No results for input(s): CKTOTAL, CKMB, CKMBINDEX, TROPONINI in the last 168 hours. BNP (last 3 results) No results for input(s): PROBNP in the last 8760 hours. HbA1C: No results for input(s): HGBA1C in the last 72 hours. CBG: Recent Labs  Lab 09/07/17 1547 09/07/17 2114 09/07/17 2359 09/08/17 0416 09/08/17 0802  GLUCAP 232* 177* 230* 215* 188*   Lipid Profile: No results for input(s): CHOL, HDL, LDLCALC, TRIG, CHOLHDL, LDLDIRECT in the last 72 hours. Thyroid Function Tests: No results for input(s): TSH, T4TOTAL, FREET4, T3FREE, THYROIDAB in the last 72 hours. Anemia Panel: No results for input(s): VITAMINB12, FOLATE, FERRITIN, TIBC, IRON, RETICCTPCT in the last 72 hours. Urine analysis:    Component Value Date/Time   COLORURINE YELLOW 08/26/2017 0345   APPEARANCEUR CLEAR 08/26/2017 0345   LABSPEC 1.015 08/26/2017 0345   PHURINE 6.0 08/26/2017 0345   GLUCOSEU 50 (A) 08/26/2017 0345   HGBUR NEGATIVE 08/26/2017 0345   BILIRUBINUR NEGATIVE 08/26/2017 0345   BILIRUBINUR neg 08/16/2012 1531   KETONESUR 5 (A) 08/26/2017 0345   PROTEINUR 30 (A) 08/26/2017 0345   UROBILINOGEN 0.2 06/25/2013 1002   NITRITE NEGATIVE 08/26/2017 0345   LEUKOCYTESUR TRACE (A) 08/26/2017 0345   Sepsis Labs: @LABRCNTIP (procalcitonin:4,lacticidven:4)  ) Recent Results (from the past 240 hour(s))  Culture, blood (routine x 2)     Status: None (Preliminary result)   Collection Time: 09/05/17  1:52 AM  Result Value Ref Range Status   Specimen Description BLOOD LEFT HAND  Final   Special Requests   Final    BOTTLES DRAWN AEROBIC AND ANAEROBIC Blood Culture adequate volume   Culture NO GROWTH 2 DAYS  Final   Report Status PENDING  Incomplete  Culture, blood (routine x 2)     Status: None (Preliminary result)   Collection Time: 09/05/17  1:52 AM  Result Value Ref Range Status   Specimen  Description BLOOD RIGHT ANTECUBITAL  Final   Special Requests   Final    BOTTLES DRAWN AEROBIC AND ANAEROBIC Blood Culture adequate volume   Culture NO GROWTH 2 DAYS  Final   Report Status PENDING  Incomplete  Aerobic/Anaerobic Culture (surgical/deep wound)     Status: None (Preliminary result)   Collection Time: 09/06/17  8:07 AM  Result Value Ref Range Status   Specimen Description TISSUE HIP RIGHT  Final   Special Requests SPEC A ON SWABS  Final   Gram Stain   Final    FEW WBC PRESENT,BOTH PMN AND MONONUCLEAR RARE GRAM POSITIVE COCCI IN PAIRS IN CLUSTERS    Culture NO GROWTH 1 DAY  Final   Report Status PENDING  Incomplete      Studies: No results found.  Scheduled Meds: . aspirin  325 mg Per Tube Daily  . atorvastatin  10 mg Per Tube Daily  . chlorhexidine  15 mL Mouth Rinse BID  . free water  30 mL Per Tube Q4H  . heparin  5,000 Units Subcutaneous Q8H  . insulin aspart  0-15 Units Subcutaneous Q4H  . lactulose  10 g Per Tube Daily  . levETIRAcetam  500 mg Per Tube BID  . mouth rinse  15 mL Mouth Rinse q12n4p  . metoprolol tartrate  25 mg Per Tube BID  . pantoprazole sodium  40 mg Per Tube Daily    Continuous Infusions: . sodium chloride 75 mL/hr at 09/08/17 0541  . feeding supplement (JEVITY 1.2 CAL) 1,000 mL (09/07/17 1131)  . piperacillin-tazobactam (ZOSYN)  IV 3.375 g (09/08/17 0541)  . vancomycin Stopped (09/07/17 0930)     LOS: 13 days        Altered mental status, most likely multifactorial 2/2 to seizure disorder vs      sepsis vs hyperammonemia vs others         -Improving      -Talks minimally and moves limbs and follows simple commands         Dysphagia        -remove NG tube today 09/08/17 per husband request         -Husband also declines peg tube placement         -speech to reevaluate  Seizure disorder -Neurology signed off -No seizures on continuous EEG -Keppra 500 mg BIDpo if able to swallow, otherwise will do IV  Sepsis 2/2 to  superficial right hip infectionvs others -continue IV van and IV zosyn  -blood culture x2 no growth x 3 days; urine cx no growth x 3 days -mrsa + 08/27/17  Hyperammonemia -lactulose -ammonia level 21 (09/08/17) - LP 10/25 per Neuro unrevealing  -24 hour EEGwith no recurrent seizures  Chronic Diastolic CHF -Strict in and out -Daily weight -Metoprolol in can take po  Essential HTN -Metoprolol  -IV hydralazine 10 mg prn for SBP>180 or dBP >105  CAD -ASA, lipitor, lopressor if can tolerate po  CKD stage III -stable -Avoid nephrotoxic meds -Hydration  Hypokalemia -Potassium goal>4 -Resolved  Hypomagnesemia -Magnesium goal> 2 -Magnesium IV 2 g   RIGHT Hip Fracture -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  Diabetes Type 2 controlled with renal complication / Hypoglycemia A1C=6.5 (08/26/17) -suffered an episode of hypoglycemia during this hospitalization. Currently CBG well controlled  Severe protein calorie malnutrition in context of chronic illness -CorTrak tube placed 10/31. Remove tube feed -Husband declines peg tube placement -aspiration precaution  Severe deconditioning/weakness -Nutrition and PT/OT when more stable  Ambulatory dysfunction -management as stated above -PT /OT evaluate and treat   Kayleen Memos, MD [facility] Pager [phone]  If 7PM-7AM, please contact night-coverage [email or web address] Password [email or web address] 09/08/2017, 8:59 AM

## 2017-09-08 NOTE — Progress Notes (Signed)
Physical Therapy Treatment Patient Details Name: [name] [name] MRN: [id number] DOB: [birth year] Today's Date: 09/08/2017    History of Present Illness Patient is a 76 y/o female who presents with witnessed seizure and now post ictal. Pt on continuous EEG. PMH includes anemia, CKD, depression, GERD, HLD, HTN, SZR, DM, Hip fracture.    PT Comments    Pt started out to be able to participate better, she sat EOB with min guard assist and stood x3, but then pt had a 2nd and 3rd bout of watery stools that necessitated a rechange of her sheets and stopping further treatment.   Follow Up Recommendations  SNF;Supervision for mobility/OOB;Supervision/Assistance - 24 hour     Equipment Recommendations  Other (comment)    Recommendations for Other Services       Precautions / Restrictions Precautions Precautions: Fall Precaution Comments: watch HR; continuous EEG Restrictions Weight Bearing Restrictions: No    Mobility  Bed Mobility Overal bed mobility: Needs Assistance Bed Mobility: Rolling;Sidelying to Sit;Sit to Sidelying Rolling: Mod assist;Max assist Sidelying to sit: Max assist;Total assist;+2 for safety/equipment     Sit to sidelying: Total assist;+2 for physical assistance General bed mobility comments: initially helping minimally, but as she fatigued and was less focused she did not assist  Transfers Overall transfer level: Needs assistance   Transfers: Sit to/from Stand Sit to Stand: Mod assist(x3)         General transfer comment: face to face assist holding both arms x2 then full assist on trunk x1  Ambulation/Gait                 Stairs            Wheelchair Mobility    Modified Rankin (Stroke Patients Only)       Balance Overall balance assessment: Needs assistance   Sitting balance-Leahy Scale: Fair(to poor) Sitting balance - Comments: when alert initially at EOB for approx 5 min, pt sat without assist, but with no challenge.   Standing  balance support: During functional activity Standing balance-Leahy Scale: Poor Standing balance comment: pt stood with light moderate assist initially, worked on w/shifting, then had her second of three loose watery stools, so mobility was abort and pt transitioned back to supine.                            Cognition Arousal/Alertness: Awake/alert Behavior During Therapy: Flat affect Overall Cognitive Status: Impaired/Different from baseline Area of Impairment: Attention;Orientation;Following commands                 Orientation Level: Disoriented to;Time;Situation;Place Current Attention Level: Focused   Following Commands: Follows one step commands inconsistently;Follows one step commands with increased time   Awareness: Intellectual Problem Solving: Slow processing;Decreased initiation;Difficulty sequencing;Requires verbal cues;Requires tactile cues        Exercises      General Comments General comments (skin integrity, edema, etc.): pt's HR maintained around 92 to low 100's, sats 98-100%,  BP did drop, though unable to get a reading.  Pt appeared to lose consciousness in standing along with 2nd of three bowel movements      Pertinent Vitals/Pain Pain Assessment: Faces Faces Pain Scale: No hurt    Home Living                      Prior Function            PT Goals (current goals can now be  found in the care plan section) Acute Rehab PT Goals Patient Stated Goal: to get her to wake up and come back home PT Goal Formulation: With family Time For Goal Achievement: 09/10/17 Potential to Achieve Goals: Fair Progress towards PT goals: Progressing toward goals    Frequency    Min 2X/week      PT Plan Current plan remains appropriate    Co-evaluation              AM-PAC PT "6 Clicks" Daily Activity  Outcome Measure  Difficulty turning over in bed (including adjusting bedclothes, sheets and blankets)?: Unable Difficulty moving  from lying on back to sitting on the side of the bed? : Unable Difficulty sitting down on and standing up from a chair with arms (e.g., wheelchair, bedside commode, etc,.)?: Unable Help needed moving to and from a bed to chair (including a wheelchair)?: A Lot Help needed walking in hospital room?: Total Help needed climbing 3-5 steps with a railing? : Total 6 Click Score: 7    End of Session   Activity Tolerance: Patient tolerated treatment well Patient left: in bed;with call bell/phone within reach;with bed alarm set Nurse Communication: Mobility status PT Visit Diagnosis: Muscle weakness (generalized) (M62.81);Other symptoms and signs involving the nervous system (R29.898)     Time: 9030-0923 PT Time Calculation (min) (ACUTE ONLY): 45 min  Charges:  $Therapeutic Activity: 8-22 mins $Self Care/Home Management: 23-37                    G Codes:       10/07/2017  Donnella Sham, PT [phone]  (pager)   Tessie Fass Amalie Koran 10/07/17, 3:41 PM

## 2017-09-08 NOTE — Plan of Care (Signed)
  Progressing Safety: Ability to remain free from injury will improve. Safety bundle implemented. 09/08/2017 0444 - Progressing by Mellissa Kohut, RN Health Behavior/Discharge Planning: Ability to manage health-related needs will improve 09/08/2017 0444 - Progressing by Mellissa Kohut, RN Pain Managment: General experience of comfort will improve 09/08/2017 0444 - Progressing by Mellissa Kohut, RN Skin Integrity: Risk for impaired skin integrity will decrease Pt repositioned q2h. No skin breakdown, falls or other injuries this shift. Peri care performed. External catheter equipment changed.  09/08/2017 0444 - Progressing by Mellissa Kohut, RN

## 2017-09-09 DIAGNOSIS — R0989 Other specified symptoms and signs involving the circulatory and respiratory systems: Secondary | ICD-10-CM

## 2017-09-09 DIAGNOSIS — L080 Pyoderma: Secondary | ICD-10-CM

## 2017-09-09 DIAGNOSIS — N183 Chronic kidney disease, stage 3 (moderate): Secondary | ICD-10-CM

## 2017-09-09 DIAGNOSIS — R509 Fever, unspecified: Secondary | ICD-10-CM

## 2017-09-09 DIAGNOSIS — G9340 Encephalopathy, unspecified: Secondary | ICD-10-CM

## 2017-09-09 LAB — CBC
HCT: 26.1 % — ABNORMAL LOW (ref 36.0–46.0)
Hemoglobin: 8.1 g/dL — ABNORMAL LOW (ref 12.0–15.0)
MCH: 31.2 pg (ref 26.0–34.0)
MCHC: 31 g/dL (ref 30.0–36.0)
MCV: 100.4 fL — AB (ref 78.0–100.0)
PLATELETS: 615 10*3/uL — AB (ref 150–400)
RBC: 2.6 MIL/uL — AB (ref 3.87–5.11)
RDW: 15.4 % (ref 11.5–15.5)
WBC: 11.8 10*3/uL — AB (ref 4.0–10.5)

## 2017-09-09 LAB — BASIC METABOLIC PANEL
ANION GAP: 10 (ref 5–15)
BUN: 11 mg/dL (ref 6–20)
CALCIUM: 8.5 mg/dL — AB (ref 8.9–10.3)
CO2: 30 mmol/L (ref 22–32)
Chloride: 100 mmol/L — ABNORMAL LOW (ref 101–111)
Creatinine, Ser: 1.33 mg/dL — ABNORMAL HIGH (ref 0.44–1.00)
GFR calc Af Amer: 44 mL/min — ABNORMAL LOW (ref >60)
GFR, EST NON AFRICAN AMERICAN: 38 mL/min — AB (ref >60)
GLUCOSE: 163 mg/dL — AB (ref 65–99)
Potassium: 3.4 mmol/L — ABNORMAL LOW (ref 3.5–5.1)
SODIUM: 140 mmol/L (ref 135–145)

## 2017-09-09 LAB — GLUCOSE, CAPILLARY
GLUCOSE-CAPILLARY: 188 mg/dL — AB (ref 65–99)
GLUCOSE-CAPILLARY: 85 mg/dL (ref 65–99)
Glucose-Capillary: 135 mg/dL — ABNORMAL HIGH (ref 65–99)
Glucose-Capillary: 166 mg/dL — ABNORMAL HIGH (ref 65–99)
Glucose-Capillary: 169 mg/dL — ABNORMAL HIGH (ref 65–99)
Glucose-Capillary: 76 mg/dL (ref 65–99)
Glucose-Capillary: 90 mg/dL (ref 65–99)

## 2017-09-09 MED ORDER — POTASSIUM CHLORIDE 10 MEQ/100ML IV SOLN
10.0000 meq | Freq: Once | INTRAVENOUS | Status: AC
  Administered 2017-09-09: 10 meq via INTRAVENOUS
  Filled 2017-09-09: qty 100

## 2017-09-09 MED ORDER — POTASSIUM CHLORIDE 10 MEQ/100ML IV SOLN
10.0000 meq | INTRAVENOUS | Status: AC
  Administered 2017-09-09 (×2): 10 meq via INTRAVENOUS
  Filled 2017-09-09 (×2): qty 100

## 2017-09-09 NOTE — Progress Notes (Signed)
  Speech Language Pathology Treatment: Dysphagia  Patient Details Name: [name] [name] MRN: [id number] DOB: 07-09-1941 Today's Date: 09/09/2017 Time: 0810-0827 SLP Time Calculation (min) (ACUTE ONLY): 17 min  Assessment / Plan / Recommendation Clinical Impression  Noted from Palliative Care note that husband has chosen comfort feeds accepting aspiration risk (husband not present this session). Pt behavior similar to last week, looking at therapist, smiled x 1. Did not follow directions but made her needs known by shaking head no when asked if she wanted more water. Cognitive impairments impacts ability to sequence cup or straw drinking. Used tip of straw to drip small amount onto lips to receive minimal amount. She did open oral cavity x 2 and accepted 1/2 teaspoon amount water with anterior spillage. Lips pursed, would not open or respond when chocolate pudding on lip. At this point would not recommend ordering a pureed diet to be sent 3 times a day when pt is either not eating or eating one bite. Recommend offering pt purees (applesauce, pudding, yogurt, ice cream) that pt likes and thin liquid/water via spoon intermittently throughout the day when pt awake/alert (SLP will write order). SLP will attempt to educate husband re: session and recommendations (in hospital room or phone).      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 Pt was also septic, likely due to UTI. Pt has had multiple swallowing evaluations in the past, although most recent recommendations have been for regular diet textures and thin liquids.       SLP Plan  Continue with current plan of care       Recommendations  Diet recommendations: Dysphagia 1 (puree);Thin liquid Liquids provided via: Cup;Straw Medication  Administration: Crushed with puree Supervision: Full supervision/cueing for compensatory strategies;Staff to assist with self feeding Compensations: Minimize environmental distractions;Slow rate;Small sips/bites Postural Changes and/or Swallow Maneuvers: Seated upright 90 degrees                Oral Care Recommendations: Oral care BID Follow up Recommendations: Skilled Nursing facility(home with hospice possibly (?)) SLP Visit Diagnosis: Dysphagia, unspecified (R13.10) Plan: Continue with current plan of care       GO                [name] 09/09/2017, 10:26 AM  [name].Ed [employer] [phone]

## 2017-09-09 NOTE — Progress Notes (Signed)
Physical Therapy Treatment Patient Details Name: [name] [name] MRN: [id number] DOB: June 21, 1941 Today's Date: 09/09/2017    History of Present Illness Patient is a 76 y/o female who presents with witnessed seizure and now post ictal. Pt on continuous EEG. PMH includes anemia, CKD, depression, GERD, HLD, HTN, SZR, DM, Hip fracture.    PT Comments    Limited by copious watery stools and relative unresponsiveness to verbal instruction.  Pt can be directed with physical cues.  Emphasis on transition to EOB, sitting balance and standing.  Much of the time limited to bed mobility while completing pericare x2 for loose stools.    Follow Up Recommendations  SNF;Supervision for mobility/OOB;Supervision/Assistance - 24 hour     Equipment Recommendations  Other (comment)(TBA)    Recommendations for Other Services       Precautions / Restrictions Precautions Precautions: Fall    Mobility  Bed Mobility Overal bed mobility: Needs Assistance Bed Mobility: Rolling;Sidelying to Sit;Sit to Sidelying Rolling: Max assist Sidelying to sit: Total assist;+2 for safety/equipment     Sit to sidelying: Total assist;+2 for physical assistance General bed mobility comments: pt will assist minimally if given hand over hand guiding cues  Transfers Overall transfer level: Needs assistance Equipment used: 1 person hand held assist Transfers: Sit to/from Stand Sit to Stand: Mod assist;+2 safety/equipment         General transfer comment: face to face assist holding both arms x2 then full assist on trunk x1  Ambulation/Gait             General Gait Details: Deferred   Stairs            Wheelchair Mobility    Modified Rankin (Stroke Patients Only)       Balance Overall balance assessment: Needs assistance   Sitting balance-Leahy Scale: Fair Sitting balance - Comments: sat 4-5 minutes withoutUE assist,  BP 147/87   Standing balance support: During functional activity Standing  balance-Leahy Scale: Poor Standing balance comment: moderate assist with posterior lean.  Limited by 2nd episode of incontinence (stool)                            Cognition Arousal/Alertness: Awake/alert Behavior During Therapy: Flat affect(smiles when caregiver smiles) Overall Cognitive Status: Impaired/Different from baseline                                        Exercises      General Comments General comments (skin integrity, edema, etc.): HR in the upper 80's,  90's and 100's bpm, sats 98%      Pertinent Vitals/Pain Pain Assessment: Faces Faces Pain Scale: Hurts little more Pain Location: unsure, ? right leg Pain Descriptors / Indicators: Grimacing    Home Living                      Prior Function            PT Goals (current goals can now be found in the care plan section) Acute Rehab PT Goals Patient Stated Goal: to get her to wake up and come back home PT Goal Formulation: With family Time For Goal Achievement: 09/10/17 Progress towards PT goals: Progressing toward goals(copious stools limiting therapy)    Frequency    Min 2X/week      PT Plan Current plan remains appropriate  Co-evaluation              AM-PAC PT "6 Clicks" Daily Activity  Outcome Measure  Difficulty turning over in bed (including adjusting bedclothes, sheets and blankets)?: Unable Difficulty moving from lying on back to sitting on the side of the bed? : Unable Difficulty sitting down on and standing up from a chair with arms (e.g., wheelchair, bedside commode, etc,.)?: Unable Help needed moving to and from a bed to chair (including a wheelchair)?: A Lot Help needed walking in hospital room?: A Lot Help needed climbing 3-5 steps with a railing? : Total 6 Click Score: 8    End of Session   Activity Tolerance: Patient tolerated treatment well Patient left: in bed;with call bell/phone within reach;with bed alarm set;with SCD's  reapplied Nurse Communication: Mobility status PT Visit Diagnosis: Muscle weakness (generalized) (M62.81);Other symptoms and signs involving the nervous system (R29.898)     Time: 1531-1601 PT Time Calculation (min) (ACUTE ONLY): 30 min  Charges:  $Therapeutic Activity: 23-37 mins                    G Codes:       Sep 16, 2017  Donnella Sham, PT [phone]  (pager)   Tessie Fass Arretta Toenjes 09/16/17, 4:15 PM

## 2017-09-09 NOTE — Care Management (Signed)
    Durable Medical Equipment  (From admission, onward)        Start     Ordered   09/09/17 1416  For home use only DME Hospital bed  Once    Question Answer Comment  The above medical condition requires: Patient requires the ability to reposition frequently   Head must be elevated greater than: 45 degrees   Bed type Semi-electric   [employer] Yes   [place] Yes      09/09/17 1416

## 2017-09-09 NOTE — Progress Notes (Signed)
PROGRESS NOTE  [name] [name] KPT:[id number] DOB: 1941/09/15 DOA: 08/26/2017 PCP: [name], MD  HPI/Recap of past 24 hours: [name] was seen and examined at her bedside. She is alert but less interactive today. Denies pain, dyspnea or chest pain. She is in the room accompanied by her husband. Husband would like to bring patient home and has been in contact with hospice.  Assessment/Plan: Active Problems:   GERD (gastroesophageal reflux disease)   DM type 2 (diabetes mellitus, type 2) (HCC)   HTN (hypertension)   CKD (chronic kidney disease) stage 3, GFR 30-59 ml/min (HCC)   Acute encephalopathy   Seizure disorder, grand mal ([place])   UTI (urinary tract infection)   Sepsis, unspecified organism ([place])   Status epilepticus ([place])   S/P right hip fracture   Postoperative wound infection of right hip   Palliative care encounter   Encounter for hospice care discussion   DNR (do not resuscitate) discussion  Code Status:Do not intubate  Family Communication: Husband in the room  Disposition Plan:Will stay another midnight to continue current treatment. Case manager is working on obtaining hospital bed for home. Discharge possibly tomorrow with home health and hospice care.   Consultants:  Neurology  Procedures:  Continuous video-EEG monitoring  Antimicrobials:  None   DVT prophylaxis: Hep sq 5000 U TID   Objective: Vitals:   09/09/17 0156 09/09/17 0635 09/09/17 0700 09/09/17 1055  BP: 129/81 (!) 149/68 (!) 166/84 138/71  Pulse: (!) 102 (!) 107 99 97  Resp: (!) 21 17 15    Temp: 98 F (36.7 C) 97.9 F (36.6 C) 97.6 F (36.4 C)   TempSrc: Oral Oral Axillary   SpO2: 97% 100% 99%   Weight: 58.5 kg (129 lb)     Height:        Intake/Output Summary (Last 24 hours) at 09/09/2017 1119 Last data filed at 09/09/2017 1020 Gross per 24 hour  Intake 950 ml  Output 1200 ml  Net -250 ml   Filed Weights   09/07/17 0640 09/08/17 0429 09/09/17 0156  Weight:  58.6 kg (129 lb 3.2 oz) 58.5 kg (129 lb) 58.5 kg (129 lb)    Exam:  General:Less interactive today. Minimally follows commands although not even at times Neck: NolymphadenopathyorJVD Lungs:Mild rhonchi at bases Cardiovascular:Regular rate and rhythm without murmur gallop or rub normal S1 and S2 Abdomen: negative abdominal pain, nondistended, positive bowel soundsx4 Extremities:Right hip at site of incision is covered with surgical dressing Skin:some bruising at needle sites Psychiatric:less interactive today Central nervous system: Pupils are reactive to light and accomodate.    Data Reviewed: CBC: Recent Labs  Lab 09/05/17 0534 09/06/17 0237 09/07/17 1019 09/08/17 0630 09/09/17 0340  WBC 18.4* 25.5* 10.2 9.4 11.8*  HGB 8.7* 9.1* 7.4* 7.3* 8.1*  HCT 29.0* 29.7* 24.0* 23.9* 26.1*  MCV 102.8* 102.8* 101.7* 102.6* 100.4*  PLT 392 545* 491* 509* 170*   Basic Metabolic Panel: Recent Labs  Lab 09/02/17 1642  09/03/17 0333 09/04/17 0405 09/05/17 0534 09/06/17 0237 09/07/17 0429 09/08/17 0630 09/09/17 0340  NA  --    < > 144 143 142 143 137 140 140  K  --    < > 4.4 4.9 5.0 4.1 4.1 3.6 3.4*  CL  --    < > 110 108 107 108 105 103 100*  CO2  --    < > 26 24 28 26 23 30 30   GLUCOSE  --    < > 232* 421* 312* 47* 251*  178* 163*  BUN  --    < > <5* 8 8 13 17 14 11   CREATININE  --    < > 1.06* 1.21* 1.10* 1.38* 1.40* 1.30* 1.33*  CALCIUM  --    < > 8.4* 8.2* 8.3* 8.7* 7.8* 8.1* 8.5*  MG 1.7  --  1.5* 1.3* 1.0* 1.5*  --   --   --   PHOS 2.1*  --  2.3* 2.2* 2.4* 3.3  --   --   --    < > = values in this interval not displayed.   GFR: Estimated Creatinine Clearance: 31.1 mL/min (A) (by C-G formula based on SCr of 1.33 mg/dL (H)). Liver Function Tests: Recent Labs  Lab 09/08/17 0630  AST 22  ALT 10*  ALKPHOS 139*  BILITOT 0.4  PROT 5.5*  ALBUMIN 1.8*   No results for input(s): LIPASE, AMYLASE in the last 168 hours. Recent Labs  Lab 09/04/17 1625  09/05/17 0205 09/08/17 0630  AMMONIA 44* 25 21   Coagulation Profile: No results for input(s): INR, PROTIME in the last 168 hours. Cardiac Enzymes: No results for input(s): CKTOTAL, CKMB, CKMBINDEX, TROPONINI in the last 168 hours. BNP (last 3 results) No results for input(s): PROBNP in the last 8760 hours. HbA1C: No results for input(s): HGBA1C in the last 72 hours. CBG: Recent Labs  Lab 09/08/17 2020 09/09/17 0038 09/09/17 0438 09/09/17 0800 09/09/17 1107  GLUCAP 177* 85 169* 76 166*   Lipid Profile: No results for input(s): CHOL, HDL, LDLCALC, TRIG, CHOLHDL, LDLDIRECT in the last 72 hours. Thyroid Function Tests: No results for input(s): TSH, T4TOTAL, FREET4, T3FREE, THYROIDAB in the last 72 hours. Anemia Panel: No results for input(s): VITAMINB12, FOLATE, FERRITIN, TIBC, IRON, RETICCTPCT in the last 72 hours. Urine analysis:    Component Value Date/Time   COLORURINE YELLOW 08/26/2017 0345   APPEARANCEUR CLEAR 08/26/2017 0345   LABSPEC 1.015 08/26/2017 0345   PHURINE 6.0 08/26/2017 0345   GLUCOSEU 50 (A) 08/26/2017 0345   HGBUR NEGATIVE 08/26/2017 0345   BILIRUBINUR NEGATIVE 08/26/2017 0345   BILIRUBINUR neg 08/16/2012 1531   KETONESUR 5 (A) 08/26/2017 0345   PROTEINUR 30 (A) 08/26/2017 0345   UROBILINOGEN 0.2 06/25/2013 1002   NITRITE NEGATIVE 08/26/2017 0345   LEUKOCYTESUR TRACE (A) 08/26/2017 0345   Sepsis Labs: @LABRCNTIP (procalcitonin:4,lacticidven:4)  ) Recent Results (from the past 240 hour(s))  Culture, blood (routine x 2)     Status: None (Preliminary result)   Collection Time: 09/05/17  1:52 AM  Result Value Ref Range Status   Specimen Description BLOOD LEFT HAND  Final   Special Requests   Final    BOTTLES DRAWN AEROBIC AND ANAEROBIC Blood Culture adequate volume   Culture NO GROWTH 3 DAYS  Final   Report Status PENDING  Incomplete  Culture, blood (routine x 2)     Status: None (Preliminary result)   Collection Time: 09/05/17  1:52 AM  Result  Value Ref Range Status   Specimen Description BLOOD RIGHT ANTECUBITAL  Final   Special Requests   Final    BOTTLES DRAWN AEROBIC AND ANAEROBIC Blood Culture adequate volume   Culture NO GROWTH 3 DAYS  Final   Report Status PENDING  Incomplete  Aerobic/Anaerobic Culture (surgical/deep wound)     Status: None (Preliminary result)   Collection Time: 09/06/17  8:07 AM  Result Value Ref Range Status   Specimen Description TISSUE HIP RIGHT  Final   Special Requests SPEC A ON SWABS  Final  Gram Stain   Final    FEW WBC PRESENT,BOTH PMN AND MONONUCLEAR RARE GRAM POSITIVE COCCI IN PAIRS IN CLUSTERS    Culture NO GROWTH 2 DAYS  Final   Report Status PENDING  Incomplete      Studies: No results found.  Scheduled Meds: . aspirin  325 mg Per Tube Daily  . atorvastatin  10 mg Per Tube Daily  . chlorhexidine  15 mL Mouth Rinse BID  . heparin  5,000 Units Subcutaneous Q8H  . insulin aspart  0-15 Units Subcutaneous Q4H  . lactulose  10 g Per Tube Daily  . levETIRAcetam  500 mg Per Tube BID  . mouth rinse  15 mL Mouth Rinse q12n4p  . metoprolol tartrate  25 mg Per Tube BID  . pantoprazole sodium  40 mg Per Tube Daily    Continuous Infusions: . sodium chloride 75 mL/hr at 09/09/17 0248  . piperacillin-tazobactam (ZOSYN)  IV Stopped (09/09/17 0901)  . vancomycin 750 mg (09/09/17 0911)     LOS: 14 days   Assessment and Plan:  Altered mental status, most likely multifactorial 2/2 to seizure disorder vs sepsis vs hyperammonemia vs others  -waxes and wanes -Talks minimally and moves limbs and follows simple commands    Dysphagia        -remove NG tube 09/08/17 per husband request         -Husband also declines peg tube placement         -speech therapist recommends puree thin liquids  Seizure disorder -No recurrent witnessed seizures -Neurology signed off -No seizures on continuous EEG -Keppra 500 mg BIDpo if able to swallow, otherwise will do IV  Sepsis 2/2 to  superficial right hipinfectionvs others -continue IV vanc; stop IV zosyn  -blood culture x2 no growth x 4 days; urine cx no growth x 4 days -mrsa + 08/27/17  Hyperammonemia -lactulose -ammonia level 21 (09/08/17) - LP 10/25 per Neuro unrevealing  -24 hour EEGwith no recurrent seizures  Chronic Diastolic CHF -Strict in and out -Daily weight -Metoprolol in can take po  Essential HTN -Metoprolol -IV hydralazine 10 mg prn for SBP>180 or dBP >105  CAD -ASA, lipitor, lopressor if can tolerate po  CKD stage III -stable -Avoid nephrotoxic meds -Hydration  Hypokalemia -Potassium goal>4 -Resolved  Hypomagnesemia -Magnesium goal> 2 -Magnesium IV 2 g   RIGHT Hip Fracture -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  Diabetes Type 2 controlled with renal complication / Hypoglycemia A1C=6.5 (08/26/17) -suffered an episode of hypoglycemia during this hospitalization. Currently CBG well controlled  Severe protein calorie malnutrition in context of chronic illness -CorTrak tube placed 10/31. Removed 09/08/17 -Husband declines peg tube placement -aspiration precaution  Severe deconditioning/weakness -Nutrition and PT/OT when more stable  Ambulatory dysfunction -management as stated above -PT /OT evaluate and treat    Kayleen Memos, MD [facility] Pager [phone]  If 7PM-7AM, please contact night-coverage [email or web address] Password [email or web address] 09/09/2017, 11:19 AM

## 2017-09-09 NOTE — Progress Notes (Signed)
Nutrition Follow-up  DOCUMENTATION CODES:   Severe malnutrition in context of chronic illness  INTERVENTION:   No nutrition intervention indicated at this time.   NUTRITION DIAGNOSIS:   Severe Malnutrition related to chronic illness, poor appetite(hip fracture recovery, poor PO intake) as evidenced by energy intake < 75% for > or equal to 1 month, percent weight loss, per patient/family report, moderate fat depletion, moderate muscle depletion.  Ongoing  GOAL:   Patient will meet greater than or equal to 90% of their needs  Unmet  MONITOR:   PO intake, Supplement acceptance, Labs, Weight trends  ASSESSMENT:   Pt is a 76 year old female admitted for a seizure and AMS. PMH of anemia, CKD, HTN, HLD, DM, GERD, depression and a hip fracture.  Patient's husband decided to have the Cortrak feeding tube removed. Plans for comfort feedings accepting aspiration risk. She is currently on a dysphagia 1 (pureed) diet with thin liquids; only consuming bites and sips of meals. SLP following. Palliative Care team is following. Plans for patient to d/c home with home health. Noted poor prognosis. No nutrition interventions indicated at this time.  Diet Order:  DIET - DYS 1 Room service appropriate? Yes; Fluid consistency: Thin  EDUCATION NEEDS:   No education needs have been identified at this time  Skin:  Skin Assessment: Skin Integrity Issues: Skin Integrity Issues:: Incisions, Other (Comment) Incisions: thigh Other: MASD to buttocks/perineum   Last BM:  11/6 (type 7)  Height:   Ht Readings from Last 1 Encounters:  08/26/17 5\' 4"  (1.626 m)    Weight:   Wt Readings from Last 1 Encounters:  09/09/17 129 lb (58.5 kg)    Ideal Body Weight:  54.5 kg  BMI:  Body mass index is 22.14 kg/m.  Estimated Nutritional Needs:   Kcal:  1300-1500 kcals  Protein:  65-75 grams   Fluid:  1.3-1.5 L   Molli Barrows, RD, LDN, CNSC Pager [phone] After Hours Pager [phone]

## 2017-09-09 NOTE — Care Management Note (Addendum)
Case Management Note  Patient Details  Name: [name] [name] MRN: [id number] Date of Birth: November 19, 1940  Subjective/Objective:  Pt presented for AMS and Seizure- hx of Seizures and CKD. Continuous EEG- IV Keppra changed via the Cortrak. Pt is from home with Support of Husband. PT/OT recommendations for SNF. Pt will benefit from Palliative Care Consult: Goals of Care - Question need of Peg Placement                    Action/Plan: Palliative Care Consulted and plan will be for home with [facility] with Palliative to follow. CM did speak with husband Agency List Provided and the plan is to use [facility] for Services: RN, PT, OT, Aide, Education officer, museum and Speech Therapy. Referral made to [facility] with [place] to begin within 24-48 hours post d/c. Referral made to [facility] for Palliative Services- Office to call the patient for services. CM just informed that CM will need to get an order from the PCP for Palliative Services. CM did call the PCP office several times busy signal. DME Referral for Hospital Bed sent to [facility] and they are processing the order.   Expected Discharge Date           Expected Discharge Plan:  Skilled Nursing Facility  In-House Referral:  Clinical Social Work  Discharge planning Services  CM Consult  Post Acute Care Choice:  Durable Medical Equipment, Home Health Choice offered to:  Spouse  DME Arranged:  Hospital bed DME Agency:  [place] Arranged:  RN, Disease Management, PT, Nurse's Aide, Speech Therapy, Social Work, OT [employer] Agency:  [place]  Status of Service:  Completed, signed off  If discussed at [employer], dates discussed:    Additional Comments: 1125 09-11-17 Jacqlyn Krauss, RN,BSN [phone] CM had a long discussion with pt's husband in regards to plan of care. Husband still feels like he will be able to take care of his wife at home. He is still not agreeable to [facility]. Per husband  the daughter will be at home to assist with care. Pt states he wants to do everything he can for her. MD is checking to see if pt will continue to need the IV antibiotics at home. CM will be in close contact with [facility] in regards to infusion care. Ambulance transport for home. CM will continue to monitor.  Bethena Roys, RN 09/09/2017, 2:33 PM

## 2017-09-09 NOTE — Plan of Care (Signed)
Hand mitts placed on patient for the night after patient pulled out purewick.

## 2017-09-09 NOTE — Progress Notes (Addendum)
Daily Progress Note   Patient Name: [name] [name]       Date: 09/09/2017 DOB: 17-Mar-1941  Age: 76 y.o. MRN#: [id number] Attending Physician: Kayleen Memos, DO Primary Care Physician: [name], MD Admit Date: 08/26/2017  Reason for Consultation/Follow-up: Establishing goals of care  Subjective: Talked with husband at bedside. He is happy the N/G is out and the hand mitts are off.   He recognizes that she is declining.  Discussed the probability of recurrent dehydration as a likely sign of needing Hospice services and being near EOL.  Husband just not ready for hospice.  He feels he and his daughter can care for her at home with the help of home health services and Palliative Care (Palliative only visits approx once q 6 weeks).  I mentioned that the patient appeared to faint yesterday with PT.  Mr. Frith commented "oh she does that all the time, but it is usually only for a few seconds".   Assessment: 73 yof very weak and tremulous.  Poor PO intake, appears to have some degree of aphasia.  With recent sepsis from UTI, then hip fracture and now wound infection - she appears to be declining significantly.  Will likely dehydrate quickly once IVF are discontinued.  Patient Profile/HPI:  76 y.o. female  with past medical history of CVA, seizures, confusion, recent hip fracture who was admitted on 08/26/2017 with seizure and sepsis.  She was found to have an infection at the site of her recent hip repair.  She has been followed by Ortho and underwent I & D on 11/4.   Length of Stay: 14  Current Medications: Scheduled Meds:  . aspirin  325 mg Per Tube Daily  . atorvastatin  10 mg Per Tube Daily  . chlorhexidine  15 mL Mouth Rinse BID  . heparin  5,000 Units Subcutaneous Q8H  . insulin aspart   0-15 Units Subcutaneous Q4H  . lactulose  10 g Per Tube Daily  . levETIRAcetam  500 mg Per Tube BID  . mouth rinse  15 mL Mouth Rinse q12n4p  . metoprolol tartrate  25 mg Per Tube BID  . pantoprazole sodium  40 mg Per Tube Daily    Continuous Infusions: . sodium chloride 75 mL/hr at 09/09/17 0248  . piperacillin-tazobactam (ZOSYN)  IV Stopped (09/09/17 0901)  .  vancomycin 750 mg (09/09/17 0911)    PRN Meds: acetaminophen (TYLENOL) oral liquid 160 mg/5 mL **OR** acetaminophen, ibuprofen, LORazepam, ondansetron **OR** ondansetron (ZOFRAN) IV, sodium chloride flush, sodium chloride flush  Physical Exam       Well developed elderly female, NAD, tremulous arms and legs.  Swollen area around left nare. cv rrr resp no distress Abdomen soft, nt, nd Neuro patient occasionally follows commands, she did not use words with me but did grunt in response to me asking if she was comfortable.   Vital Signs: BP (!) 166/84 (BP Location: Left Leg)   Pulse 99   Temp 97.6 F (36.4 C) (Axillary)   Resp 15   Ht 5\' 4"  (1.626 m)   Wt 58.5 kg (129 lb)   SpO2 99%   BMI 22.14 kg/m  SpO2: SpO2: 99 % O2 Device: O2 Device: Not Delivered O2 Flow Rate: O2 Flow Rate (L/min): 2 L/min  Intake/output summary:   Intake/Output Summary (Last 24 hours) at 09/09/2017 1026 Last data filed at 09/09/2017 0600 Gross per 24 hour  Intake 950 ml  Output 800 ml  Net 150 ml   LBM: Last BM Date: 09/08/17 Baseline Weight: Weight: 59 kg (130 lb) Most recent weight: Weight: 58.5 kg (129 lb)       Palliative Assessment/Data: 20%      Patient Active Problem List   Diagnosis Date Noted  . Palliative care encounter   . Encounter for hospice care discussion   . DNR (do not resuscitate) discussion   . Postoperative wound infection of right hip   . Status epilepticus ([place]) 08/26/2017  . S/P right hip fracture 08/26/2017  . Status post total hip replacement, right 08/10/2017  . Closed right hip fracture, initial  encounter ([place]) 07/27/2017  . Hip fracture ([place]) 07/27/2017  . Pressure injury of skin 06/14/2017  . Encounter for palliative care   . Goals of care, counseling/discussion   . CAP (community acquired pneumonia) 06/12/2017  . Sepsis ([place]) 06/11/2017  . Weakness 06/11/2017  . Anemia 06/11/2017  . AKI (acute kidney injury) ([place]) 06/05/2017  . Sepsis, unspecified organism ([place]) 06/05/2017  . Partial symptomatic epilepsy with complex partial seizures, not intractable, without status epilepticus ([place])   . Bacteremia due to Escherichia coli 11/18/2016  . Renal insufficiency   . Aortic atherosclerosis ([place]) 11/15/2016  . UTI (urinary tract infection) 11/15/2016  . Paranoia ([place]) 05/24/2015  . Tachycardia 07/02/2013  . Hypophosphatemia 07/02/2013  . Encounter for central line placement 07/02/2013  . Hypokalemia 07/01/2013  . Hypomagnesemia 07/01/2013  . Protein-calorie malnutrition, severe ([place]) 06/29/2013  . Leukocytosis, unspecified 06/29/2013  . 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 TIA (transient ischemic attack) 12/13/2012  . 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 Syncope 09/08/2012  . Acute respiratory failure with hypoxia ([place]) 08/20/2012  . Seizure disorder, grand mal ([place]) 08/20/2012  . Seizure ([place]) 08/20/2012  . CKD (chronic kidney disease) stage 3, GFR 30-59 ml/min (HCC) 08/19/2012  . Leukocytosis 08/19/2012  . Hemorrhage in the brain-13 x 22 x 14 mm right posterior temporo-occipital intra-axial acute 08/19/2012  . Left hip pain 08/19/2012  . Left hemiparesis ([place]) 08/19/2012  . Hemianopia, homonymous, left 08/19/2012  . Hemisensory deficit, left 08/19/2012  . Nausea 08/19/2012  . Headache(784.0) 08/19/2012  . Acute encephalopathy 08/19/2012  . Hemorrhage of brain, nontraumatic ([place]) 08/18/2012  . Dysphagia 08/18/2012  . HTN (hypertension) 08/18/2012  . DM type 2 (diabetes mellitus, type 2) ([place]) 07/26/2012  . Hypoglycemia  due to insulin  07/26/2012  . GERD (gastroesophageal reflux disease)     Palliative Care Plan    Recommendations/Plan:  Palliative Care to follow outpatient please.  D/C IVF to determine if she can eat/drink enough to support her needs.  If she can not - I need to have another conversation with her husband.  Family wants NO PEG  Goals of Care and Additional Recommendations:  Limitations on Scope of Treatment: Full Scope Treatment  Code Status:  Limited code DNI  Prognosis:   < 3 months   Discharge Planning:  Home with Home Health and Palliative care.  Care plan was discussed with husband and case Freight forwarder.  Thank you for allowing the Palliative Medicine Team to assist in the care of this patient.  Total time spent:  25 min.     Greater than 50%  of this time was spent counseling and coordinating care related to the above assessment and plan.  Florentina Jenny, PA-C Palliative Medicine  Please contact Palliative MedicineTeam phone at [phone] for questions and concerns between 7 am - 7 pm.   Please see AMION for individual provider pager numbers.

## 2017-09-10 ENCOUNTER — Inpatient Hospital Stay (HOSPITAL_COMMUNITY): Payer: Medicare Other

## 2017-09-10 DIAGNOSIS — T383X5A Adverse effect of insulin and oral hypoglycemic [antidiabetic] drugs, initial encounter: Secondary | ICD-10-CM

## 2017-09-10 DIAGNOSIS — E160 Drug-induced hypoglycemia without coma: Secondary | ICD-10-CM

## 2017-09-10 DIAGNOSIS — R5081 Fever presenting with conditions classified elsewhere: Secondary | ICD-10-CM

## 2017-09-10 DIAGNOSIS — R000 Tachycardia, unspecified: Secondary | ICD-10-CM

## 2017-09-10 DIAGNOSIS — R531 Weakness: Secondary | ICD-10-CM

## 2017-09-10 DIAGNOSIS — E876 Hypokalemia: Secondary | ICD-10-CM

## 2017-09-10 DIAGNOSIS — R1312 Dysphagia, oropharyngeal phase: Secondary | ICD-10-CM

## 2017-09-10 DIAGNOSIS — Z96641 Presence of right artificial hip joint: Secondary | ICD-10-CM

## 2017-09-10 LAB — GLUCOSE, CAPILLARY
GLUCOSE-CAPILLARY: 155 mg/dL — AB (ref 65–99)
GLUCOSE-CAPILLARY: 168 mg/dL — AB (ref 65–99)
GLUCOSE-CAPILLARY: 170 mg/dL — AB (ref 65–99)
Glucose-Capillary: 122 mg/dL — ABNORMAL HIGH (ref 65–99)
Glucose-Capillary: 174 mg/dL — ABNORMAL HIGH (ref 65–99)
Glucose-Capillary: 115 mg/dL — ABNORMAL HIGH (ref 65–99)

## 2017-09-10 LAB — CBC
HEMATOCRIT: 28.1 % — AB (ref 36.0–46.0)
Hemoglobin: 8.8 g/dL — ABNORMAL LOW (ref 12.0–15.0)
MCH: 32.1 pg (ref 26.0–34.0)
MCHC: 31.3 g/dL (ref 30.0–36.0)
MCV: 102.6 fL — ABNORMAL HIGH (ref 78.0–100.0)
Platelets: 720 10*3/uL — ABNORMAL HIGH (ref 150–400)
RBC: 2.74 MIL/uL — ABNORMAL LOW (ref 3.87–5.11)
RDW: 16.5 % — AB (ref 11.5–15.5)
WBC: 13.1 10*3/uL — ABNORMAL HIGH (ref 4.0–10.5)

## 2017-09-10 LAB — CULTURE, BLOOD (ROUTINE X 2)
CULTURE: NO GROWTH
Culture: NO GROWTH
SPECIAL REQUESTS: ADEQUATE
Special Requests: ADEQUATE

## 2017-09-10 LAB — BASIC METABOLIC PANEL
Anion gap: 9 (ref 5–15)
BUN: 8 mg/dL (ref 6–20)
CALCIUM: 8.3 mg/dL — AB (ref 8.9–10.3)
CO2: 26 mmol/L (ref 22–32)
CREATININE: 1.18 mg/dL — AB (ref 0.44–1.00)
Chloride: 102 mmol/L (ref 101–111)
GFR calc non Af Amer: 44 mL/min — ABNORMAL LOW (ref >60)
GFR, EST AFRICAN AMERICAN: 51 mL/min — AB (ref >60)
Glucose, Bld: 152 mg/dL — ABNORMAL HIGH (ref 65–99)
Potassium: 3.6 mmol/L (ref 3.5–5.1)
SODIUM: 137 mmol/L (ref 135–145)

## 2017-09-10 LAB — PROCALCITONIN

## 2017-09-10 LAB — LACTIC ACID, PLASMA: Lactic Acid, Venous: 1 mmol/L (ref 0.5–1.9)

## 2017-09-10 MED ORDER — PIPERACILLIN-TAZOBACTAM 3.375 G IVPB
3.3750 g | Freq: Three times a day (TID) | INTRAVENOUS | Status: DC
  Filled 2017-09-10 (×4): qty 50

## 2017-09-10 MED ORDER — VANCOMYCIN HCL 500 MG IV SOLR
500.0000 mg | Freq: Two times a day (BID) | INTRAVENOUS | Status: DC
  Administered 2017-09-10: 500 mg via INTRAVENOUS
  Filled 2017-09-10 (×2): qty 500

## 2017-09-10 MED ORDER — METOPROLOL TARTRATE 25 MG/10 ML ORAL SUSPENSION
25.0000 mg | Freq: Two times a day (BID) | ORAL | Status: DC
  Administered 2017-09-10 – 2017-09-11 (×3): 25 mg via ORAL
  Filled 2017-09-10 (×4): qty 10

## 2017-09-10 NOTE — Progress Notes (Signed)
  Speech Language Pathology Treatment: Dysphagia  Patient Details Name: [name] [name] MRN: [id number] DOB: 04/13/1941 Today's Date: 09/10/2017 Time: 4403-4742 SLP Time Calculation (min) (ACUTE ONLY): 19 min  Assessment / Plan / Recommendation Clinical Impression  Reviewed/discussed and educated husband on swallow status past few days. [name] [name] has begun to accept small bites/sips of puree and thin liquid yesterday with this therapist, last evening with husband and during today's session.  She required "warm up" period to cognitively and motoricaly sequence oral motor to use straw effectively. Consumed several sips thin juice via spoon and straw, bites pudding and grits without evidence of airway intrusion however cannot detect fully at bedside. Plans are home with Palliative care today. Continue thin liquids and puree foods. Do not project she will be able to advance much higher than ground/finely chopped foods which she is at risk for holding and pocketing. Would continue the puree at home with attempts at slightly more solid textures IF able. No further ST needed.    HPI HPI: 49yoF w/ a Hx CVA (right posterior temporo-occipital intra-axial hemorrhage), Seizures, Depression, Anemia, Chronic back pain, CKD stage III, HLD, HTN, DM2, GERD, and Hip fracture who suffered a seizure on the day of admission, lasting 30-45 minutes. Pt was also septic, likely due to UTI. Pt has had multiple swallowing evaluations in the past, although most recent recommendations have been for regular diet textures and thin liquids.       SLP Plan  Continue with current plan of care       Recommendations  Diet recommendations: Thin liquid;Dysphagia 1 (puree) Liquids provided via: Cup;Straw;Teaspoon Medication Administration: Crushed with puree Supervision: Full supervision/cueing for compensatory strategies;Staff to assist with self feeding Compensations: Minimize environmental distractions;Slow rate;Small  sips/bites Postural Changes and/or Swallow Maneuvers: Seated upright 90 degrees                Oral Care Recommendations: Oral care BID Follow up Recommendations: None SLP Visit Diagnosis: Dysphagia, unspecified (R13.10) Plan: Continue with current plan of care       GO                [name] [name] 09/10/2017, 10:07 AM   [name].Ed [employer] [phone]

## 2017-09-10 NOTE — Progress Notes (Signed)
Occupational Therapy Treatment Patient Details Name: [name] [name] MRN: [id number] DOB: 09-25-1941 Today's Date: 09/10/2017    History of present illness Patient is a 76 y/o female who presents with witnessed seizure and now post ictal. Pt on continuous EEG. PMH includes anemia, CKD, depression, GERD, HLD, HTN, SZR, DM, Hip fracture.   OT comments  Pt alert, responds by looking at therapist when name is called or asked if she was hungry. Flat affect, continues to have a very short attention span. Requiring total assist for bed level mobility and grooming tasks.   Follow Up Recommendations  SNF    Equipment Recommendations       Recommendations for Other Services      Precautions / Restrictions Precautions Precautions: Fall       Mobility Bed Mobility Overal bed mobility: Needs Assistance             General bed mobility comments: +2 total assist to position upright in bed  Transfers                      Balance                                           ADL either performed or assessed with clinical judgement   ADL   Eating/Feeding: Total assistance;Bed level   Grooming: Brushing hair;Wash/dry hands;Bed level;Total assistance Grooming Details (indicate cue type and reason): no attempt to initiate or continue activity once started                                     Vision       Perception     Praxis      Cognition Arousal/Alertness: Awake/alert Behavior During Therapy: Flat affect Overall Cognitive Status: Impaired/Different from baseline Area of Impairment: Attention;Following commands                   Current Attention Level: Focused   Following Commands: (not following commands)       General Comments: Responding to her name and when asked if she was hungry by lifting her head and looking at therapist. Also looked toward husband.        Exercises     Shoulder Instructions       General  Comments      Pertinent Vitals/ Pain       Pain Assessment: Faces Faces Pain Scale: No hurt  Home Living                                          Prior Functioning/Environment              Frequency  Min 2X/week        Progress Toward Goals  OT Goals(current goals can now be found in the care plan section)  Progress towards OT goals: Progressing toward goals  Acute Rehab OT Goals Patient Stated Goal: to get her to wake up and come back home OT Goal Formulation: With family Time For Goal Achievement: 09/10/17 Potential to Achieve Goals: [place] Discharge plan remains appropriate    Co-evaluation  AM-PAC PT "6 Clicks" Daily Activity     Outcome Measure   Help from another person eating meals?: Total Help from another person taking care of personal grooming?: Total Help from another person toileting, which includes using toliet, bedpan, or urinal?: Total Help from another person bathing (including washing, rinsing, drying)?: Total Help from another person to put on and taking off regular upper body clothing?: Total Help from another person to put on and taking off regular lower body clothing?: Total 6 Click Score: 6    End of Session    OT Visit Diagnosis: Cognitive communication deficit (R41.841);Muscle weakness (generalized) (M62.81)   Activity Tolerance Patient tolerated treatment well   Patient Left in bed;with call bell/phone within reach;with bed alarm set;with family/visitor present(ST in room)   Nurse Communication          Time: 8677-3736 OT Time Calculation (min): 13 min  Charges: OT General Charges $OT Visit: 1 Visit OT Treatments $Self Care/Home Management : 8-22 mins  09/10/2017 Nestor Lewandowsky, OTR/L Pager: [phone] Werner Lean Haze [place] 09/10/2017, 9:36 AM

## 2017-09-10 NOTE — Progress Notes (Signed)
Pharmacy Antibiotic Note  [name] [name] is a 76 y.o. female admitted on 08/26/2017 with prosthetic joint infection.  Pharmacy managing vancomycin dosing. Pt is now febrile and WBC is trending up. SCr has trended down to 1.18.  Plan: -Increase vanc to 500 mg IV Q12H per discussion with MD given improved renal fx and new fevers. -F/u renal fxn, C&S, clinical status and trough at SS   Height: 5\' 4"  (162.6 cm) Weight: 126 lb (57.2 kg) IBW/kg (Calculated) : 54.7  Temp (24hrs), Avg:99.4 F (37.4 C), Min:97.4 F (36.3 C), Max:101.7 F (38.7 C)  Recent Labs  Lab 09/05/17 0204 09/05/17 0533  09/06/17 0237 09/07/17 0429 09/07/17 1019 09/08/17 0630 09/09/17 0340 09/10/17 0547 09/10/17 0730  WBC  --   --    < > 25.5*  --  10.2 9.4 11.8* 13.1*  --   CREATININE  --   --    < > 1.38* 1.40*  --  1.30* 1.33* 1.18*  --   LATICACIDVEN 1.6 1.7  --   --   --   --   --   --   --  1.0   < > = values in this interval not displayed.    Estimated Creatinine Clearance: 35 mL/min (A) (by C-G formula based on SCr of 1.18 mg/dL (H)).    Allergies  Allergen Reactions  . Barbiturates     Becomes restless.  Also with all "strong" medications like sleeping pills  . Latex Itching    Antimicrobials this admission: 10/24 rocephin >> 10/25 10/25 vanc >>10/29; 11/3>> 10/25 zosyn >>10/29; 11/3>>  Microbiology results: 10/25 CSF >> NGx2 10/25 urine >> neg 10/24 blood x2 >> NGx3 10/25 MRSA PCR +  10/25 LP not suggestive of meningitis, cx NGx2d 11/3 BCx: ngtd 11/3 right hip tissue: rare GPC pairs/clusters  Albertina Parr, PharmD., BCPS Clinical Pharmacist Pager [phone]

## 2017-09-10 NOTE — Progress Notes (Signed)
Pharmacy Antibiotic Note  [name] [name] is a 76 y.o. female admitted on 08/26/2017 with seizures who has been receiving antibiotics for a superficial R-hip infection however now there is concern for aspiration PNA and pharmacy has been consulted to restart Zosyn.  Plan: 1. Start Zosyn 3.375g IV every 8 hours 2. Will continue to follow renal function, culture results, LOT, and antibiotic de-escalation plans   Height: 5\' 4"  (162.6 cm) Weight: 126 lb (57.2 kg) IBW/kg (Calculated) : 54.7  Temp (24hrs), Avg:99.7 F (37.6 C), Min:98.3 F (36.8 C), Max:101.7 F (38.7 C)  Recent Labs  Lab 09/05/17 0204 09/05/17 0533  09/06/17 0237 09/07/17 0429 09/07/17 1019 09/08/17 0630 09/09/17 0340 09/10/17 0547 09/10/17 0730  WBC  --   --    < > 25.5*  --  10.2 9.4 11.8* 13.1*  --   CREATININE  --   --    < > 1.38* 1.40*  --  1.30* 1.33* 1.18*  --   LATICACIDVEN 1.6 1.7  --   --   --   --   --   --   --  1.0   < > = values in this interval not displayed.    Estimated Creatinine Clearance: 35 mL/min (A) (by C-G formula based on SCr of 1.18 mg/dL (H)).    Allergies  Allergen Reactions  . Barbiturates     Becomes restless.  Also with all "strong" medications like sleeping pills  . Latex Itching    Antimicrobials this admission: Vanc 11/3 >> Zosyn 11/3 >> 11/7; restart 11/8  Dose adjustments this admission: n/a  Microbiology results: 10/24 BCx >> NGf 10/25 MRSA PCR >> positive 10/25 UCx >> NGf 10/25 CSF cx >> NGf 11/3 BCx >> NGf 11/4 Surgical/deep wound cx >> 11/8 BCx >> 11/8 RCx >>  Thank you for allowing pharmacy to be a part of this patient's care.  Alycia Rossetti, PharmD, BCPS Clinical Pharmacist Pager: [phone] If after 3:30p, please call main pharmacy at: [phone] 09/10/2017 3:15 PM

## 2017-09-10 NOTE — Progress Notes (Signed)
PROGRESS NOTE  [name] [name] JKD:[id number] DOB: [birth year] DOA: 08/26/2017 PCP: [name], MD  HPI/Recap of past 24 hours: [name] was seen and examined with her husband at her bedside. She is eating some bites with the speech therapist. She is alert but still confused. She denies any pain, chest pain or dyspnea. She follows simple commands.  Assessment/Plan: Active Problems:   GERD (gastroesophageal reflux disease)   DM type 2 (diabetes mellitus, type 2) (HCC)   HTN (hypertension)   CKD (chronic kidney disease) stage 3, GFR 30-59 ml/min (HCC)   Acute encephalopathy   Seizure disorder, grand mal ([place])   UTI (urinary tract infection)   Sepsis, unspecified organism ([place])   Status epilepticus ([place])   S/P right hip fracture   Postoperative wound infection of right hip   Palliative care encounter   Encounter for hospice care discussion   DNR (do not resuscitate) discussion   Fever   Purulent dermatitis   Rhonchi  Code Status:Do not intubate  Family Communication: Husband in the room  Disposition Plan:Will stay another midnight to continue current treatment. Case manager is working on obtaining hospital bed for home. Discharge possibly tomorrow with home health and hospice care.   Consultants:  Neurology  Procedures:  Continuous video-EEG monitoring  Antimicrobials:  None   DVT prophylaxis: Hep sq 5000 U TID   Objective: Vitals:   09/09/17 2341 09/10/17 0300 09/10/17 0435 09/10/17 0758  BP: 136/60  132/73 (!) 174/59  Pulse: 98  (!) 102   Resp: (!) 24  16   Temp:   98.6 F (37 C) 100 F (37.8 C)  TempSrc:   Oral Axillary  SpO2: 97%  100% 98%  Weight:  57.2 kg (126 lb)    Height:        Intake/Output Summary (Last 24 hours) at 09/10/2017 0946 Last data filed at 09/10/2017 0435 Gross per 24 hour  Intake 340 ml  Output 2050 ml  Net -1710 ml   Filed Weights   09/08/17 0429 09/09/17 0156 09/10/17 0300  Weight: 58.5 kg (129 lb) 58.5 kg  (129 lb) 57.2 kg (126 lb)    Exam:  General: A little more interactive today.Follows simple commands. Neck: NolymphadenopathyorJVD Lungs:Mild rhonchi at bases Cardiovascular:Regular rate and rhythm without murmur gallop or rub normal S1 and S2 Abdomen: negative abdominal pain, nondistended, positive bowel soundsx4 Extremities:Right hip at site of incisionis covered with surgical dressing Skin:some bruising at needle sites Psychiatric:Mood is appropriate for condition and setting Central nervous system:Pupils are round and reactive to light  Data Reviewed: CBC: Recent Labs  Lab 09/06/17 0237 09/07/17 1019 09/08/17 0630 09/09/17 0340 09/10/17 0547  WBC 25.5* 10.2 9.4 11.8* 13.1*  HGB 9.1* 7.4* 7.3* 8.1* 8.8*  HCT 29.7* 24.0* 23.9* 26.1* 28.1*  MCV 102.8* 101.7* 102.6* 100.4* 102.6*  PLT 545* 491* 509* 615* 099*   Basic Metabolic Panel: Recent Labs  Lab 09/04/17 0405 09/05/17 0534 09/06/17 0237 09/07/17 0429 09/08/17 0630 09/09/17 0340 09/10/17 0547  NA 143 142 143 137 140 140 137  K 4.9 5.0 4.1 4.1 3.6 3.4* 3.6  CL 108 107 108 105 103 100* 102  CO2 24 28 26 23 30 30 26   GLUCOSE 421* 312* 47* 251* 178* 163* 152*  BUN 8 8 13 17 14 11 8   CREATININE 1.21* 1.10* 1.38* 1.40* 1.30* 1.33* 1.18*  CALCIUM 8.2* 8.3* 8.7* 7.8* 8.1* 8.5* 8.3*  MG 1.3* 1.0* 1.5*  --   --   --   --  PHOS 2.2* 2.4* 3.3  --   --   --   --    GFR: Estimated Creatinine Clearance: 35 mL/min (A) (by C-G formula based on SCr of 1.18 mg/dL (H)). Liver Function Tests: Recent Labs  Lab 09/08/17 0630  AST 22  ALT 10*  ALKPHOS 139*  BILITOT 0.4  PROT 5.5*  ALBUMIN 1.8*   No results for input(s): LIPASE, AMYLASE in the last 168 hours. Recent Labs  Lab 09/04/17 1625 09/05/17 0205 09/08/17 0630  AMMONIA 44* 25 21   Coagulation Profile: No results for input(s): INR, PROTIME in the last 168 hours. Cardiac Enzymes: No results for input(s): CKTOTAL, CKMB, CKMBINDEX, TROPONINI in  the last 168 hours. BNP (last 3 results) No results for input(s): PROBNP in the last 8760 hours. HbA1C: No results for input(s): HGBA1C in the last 72 hours. CBG: Recent Labs  Lab 09/09/17 1503 09/09/17 2037 09/09/17 2339 09/10/17 0433 09/10/17 0754  GLUCAP 90 188* 135* 168* 122*   Lipid Profile: No results for input(s): CHOL, HDL, LDLCALC, TRIG, CHOLHDL, LDLDIRECT in the last 72 hours. Thyroid Function Tests: No results for input(s): TSH, T4TOTAL, FREET4, T3FREE, THYROIDAB in the last 72 hours. Anemia Panel: No results for input(s): VITAMINB12, FOLATE, FERRITIN, TIBC, IRON, RETICCTPCT in the last 72 hours. Urine analysis:    Component Value Date/Time   COLORURINE YELLOW 08/26/2017 0345   APPEARANCEUR CLEAR 08/26/2017 0345   LABSPEC 1.015 08/26/2017 0345   PHURINE 6.0 08/26/2017 0345   GLUCOSEU 50 (A) 08/26/2017 0345   HGBUR NEGATIVE 08/26/2017 0345   BILIRUBINUR NEGATIVE 08/26/2017 0345   BILIRUBINUR neg 08/16/2012 1531   KETONESUR 5 (A) 08/26/2017 0345   PROTEINUR 30 (A) 08/26/2017 0345   UROBILINOGEN 0.2 06/25/2013 1002   NITRITE NEGATIVE 08/26/2017 0345   LEUKOCYTESUR TRACE (A) 08/26/2017 0345   Sepsis Labs: @LABRCNTIP (procalcitonin:4,lacticidven:4)  ) Recent Results (from the past 240 hour(s))  Culture, blood (routine x 2)     Status: None (Preliminary result)   Collection Time: 09/05/17  1:52 AM  Result Value Ref Range Status   Specimen Description BLOOD LEFT HAND  Final   Special Requests   Final    BOTTLES DRAWN AEROBIC AND ANAEROBIC Blood Culture adequate volume   Culture NO GROWTH 4 DAYS  Final   Report Status PENDING  Incomplete  Culture, blood (routine x 2)     Status: None (Preliminary result)   Collection Time: 09/05/17  1:52 AM  Result Value Ref Range Status   Specimen Description BLOOD RIGHT ANTECUBITAL  Final   Special Requests   Final    BOTTLES DRAWN AEROBIC AND ANAEROBIC Blood Culture adequate volume   Culture NO GROWTH 4 DAYS  Final    Report Status PENDING  Incomplete  Aerobic/Anaerobic Culture (surgical/deep wound)     Status: None (Preliminary result)   Collection Time: 09/06/17  8:07 AM  Result Value Ref Range Status   Specimen Description TISSUE HIP RIGHT  Final   Special Requests SPEC A ON SWABS  Final   Gram Stain   Final    FEW WBC PRESENT,BOTH PMN AND MONONUCLEAR RARE GRAM POSITIVE COCCI IN PAIRS IN CLUSTERS    Culture   Final    NO GROWTH 3 DAYS NO ANAEROBES ISOLATED; CULTURE IN PROGRESS FOR 5 DAYS   Report Status PENDING  Incomplete      Studies: No results found.  Scheduled Meds: . aspirin  325 mg Per Tube Daily  . atorvastatin  10 mg Per Tube Daily  .  chlorhexidine  15 mL Mouth Rinse BID  . heparin  5,000 Units Subcutaneous Q8H  . insulin aspart  0-15 Units Subcutaneous Q4H  . lactulose  10 g Per Tube Daily  . levETIRAcetam  500 mg Per Tube BID  . mouth rinse  15 mL Mouth Rinse q12n4p  . metoprolol tartrate  25 mg Per Tube BID  . pantoprazole sodium  40 mg Per Tube Daily    Continuous Infusions: . sodium chloride 75 mL/hr at 09/09/17 2022  . vancomycin Stopped (09/09/17 1011)     LOS: 15 days   Assessment and Plan:  Altered mental status, most likely multifactorial 2/2 to seizure disorder vs sepsis vs hyperammonemia vs others  -waxes and wanes -Talks minimally, moves limbs and follows simple commands  SIRS with unclear source of infection -Tmax 101.3 -blood culture x2, sputum culture and chest xray -on IV vancomycin -Add zosyn until can rule out aspiration pneumonia  Dysphagia - NG tube removed on 09/08/17 per husband request -Husband also declines peg tube placement -speech therapist recommends puree thin liquids  Seizure disorder -No recurrent witnessed seizures -Neurologysigned off -No seizures on continuous EEG -Keppra 500 mg BIDpo if able to swallow, otherwise will do IV  Sepsis 2/2 to superficial right hipinfectionvs  others -continueIV vanc; restart IV zosyn  -blood cx drawn today 09/10/17 due to fever with Tmax 101.3 -mrsa+ 08/27/17  Hyperammonemia -ammonia level21 (09/08/17) - LP 10/25 per Neuro unrevealing  -24 hour EEGwith no recurrent seizures  Chronic Diastolic CHF -I&O's -Daily weight -Metoprololin can take po  Essential HTN -Metoprolol -IV hydralazine 10 mg prn for SBP>180 or dBP >105  CAD -ASA, lipitor, lopressorif can tolerate po  CKD stage III -stable -Avoid nephrotoxic meds -Hydration  Hypokalemia -Potassium goal>4 -Resolved  Hypomagnesemia -Magnesium goal> 2 -Magnesium IV 2 g   RIGHT Hip Fracture -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  Diabetes Type 2 controlled with renal complication / Hypoglycemia A1C=6.5 (08/26/17) -suffered an episode of hypoglycemia during this hospitalization. Currently CBG well controlled  Severe protein calorie malnutrition in context of chronic illness -CorTrak tube placed 10/31.Removed 09/08/17 -Husband declines peg tube placement -aspiration precaution  Severe deconditioning/weakness -Nutrition and PT/OT when more stable  Ambulatory dysfunction -management as stated above -PT /OT evaluate and treat    Kayleen Memos, MD [facility] Pager [phone]  If 7PM-7AM, please contact night-coverage [email or web address] Password [facility] 09/10/2017, 9:46 AM

## 2017-09-11 LAB — AEROBIC/ANAEROBIC CULTURE W GRAM STAIN (SURGICAL/DEEP WOUND)

## 2017-09-11 LAB — GLUCOSE, CAPILLARY
GLUCOSE-CAPILLARY: 164 mg/dL — AB (ref 65–99)
GLUCOSE-CAPILLARY: 149 mg/dL — AB (ref 65–99)
Glucose-Capillary: 85 mg/dL (ref 65–99)
Glucose-Capillary: 163 mg/dL — ABNORMAL HIGH (ref 65–99)

## 2017-09-11 LAB — CBC WITH DIFFERENTIAL/PLATELET
Basophils Absolute: 0.1 10*3/uL (ref 0.0–0.1)
Basophils Relative: 1 %
EOS PCT: 1 %
Eosinophils Absolute: 0.1 10*3/uL (ref 0.0–0.7)
HEMATOCRIT: 25.3 % — AB (ref 36.0–46.0)
Hemoglobin: 7.7 g/dL — ABNORMAL LOW (ref 12.0–15.0)
LYMPHS ABS: 3.9 10*3/uL (ref 0.7–4.0)
Lymphocytes Relative: 30 %
MCH: 31 pg (ref 26.0–34.0)
MCHC: 30.4 g/dL (ref 30.0–36.0)
MCV: 102 fL — AB (ref 78.0–100.0)
MONO ABS: 2 10*3/uL — AB (ref 0.1–1.0)
Monocytes Relative: 15 %
Neutro Abs: 6.9 10*3/uL (ref 1.7–7.7)
Neutrophils Relative %: 53 %
Platelets: 662 10*3/uL — ABNORMAL HIGH (ref 150–400)
RBC: 2.48 MIL/uL — AB (ref 3.87–5.11)
RDW: 16.8 % — AB (ref 11.5–15.5)
WBC: 13 10*3/uL — AB (ref 4.0–10.5)

## 2017-09-11 LAB — AEROBIC/ANAEROBIC CULTURE (SURGICAL/DEEP WOUND): CULTURE: NO GROWTH

## 2017-09-11 LAB — BASIC METABOLIC PANEL
Anion gap: 10 (ref 5–15)
BUN: 8 mg/dL (ref 6–20)
CALCIUM: 8 mg/dL — AB (ref 8.9–10.3)
CO2: 25 mmol/L (ref 22–32)
CREATININE: 1.17 mg/dL — AB (ref 0.44–1.00)
Chloride: 104 mmol/L (ref 101–111)
GFR calc Af Amer: 51 mL/min — ABNORMAL LOW (ref >60)
GFR calc non Af Amer: 44 mL/min — ABNORMAL LOW (ref >60)
GLUCOSE: 129 mg/dL — AB (ref 65–99)
Potassium: 3.4 mmol/L — ABNORMAL LOW (ref 3.5–5.1)
Sodium: 139 mmol/L (ref 135–145)

## 2017-09-11 LAB — PREPARE RBC (CROSSMATCH)

## 2017-09-11 LAB — PROCALCITONIN: Procalcitonin: <0.1 ng/mL

## 2017-09-11 LAB — ABO/RH: ABO/RH(D): O NEG

## 2017-09-11 MED ORDER — ACETAMINOPHEN 160 MG/5ML PO SOLN
650.0000 mg | Freq: Four times a day (QID) | ORAL | Status: DC | PRN
  Administered 2017-09-11: 650 mg via ORAL
  Filled 2017-09-11: qty 20.3

## 2017-09-11 MED ORDER — ONDANSETRON HCL 4 MG/2ML IJ SOLN
4.0000 mg | Freq: Four times a day (QID) | INTRAMUSCULAR | Status: DC | PRN
Start: 2017-09-11 — End: 2017-09-11

## 2017-09-11 MED ORDER — LEVETIRACETAM 100 MG/ML PO SOLN
500.0000 mg | Freq: Two times a day (BID) | ORAL | Status: DC
  Administered 2017-09-11: 500 mg via ORAL
  Filled 2017-09-11 (×2): qty 5

## 2017-09-11 MED ORDER — POTASSIUM CHLORIDE CRYS ER 20 MEQ PO TBCR
40.0000 meq | EXTENDED_RELEASE_TABLET | Freq: Once | ORAL | Status: AC
  Administered 2017-09-11: 40 meq via ORAL
  Filled 2017-09-11: qty 2

## 2017-09-11 MED ORDER — PANTOPRAZOLE SODIUM 40 MG PO PACK
40.0000 mg | PACK | Freq: Every day | ORAL | Status: DC
  Administered 2017-09-11: 40 mg via ORAL
  Filled 2017-09-11: qty 20

## 2017-09-11 MED ORDER — SODIUM CHLORIDE 0.9 % IV SOLN: Freq: Once | INTRAVENOUS | Status: AC

## 2017-09-11 MED ORDER — SULFAMETHOXAZOLE-TRIMETHOPRIM 200-40 MG/5ML PO SUSP
10.0000 mL | Freq: Two times a day (BID) | ORAL | Status: DC
  Administered 2017-09-11: 10 mL via ORAL
  Filled 2017-09-11 (×2): qty 10

## 2017-09-11 MED ORDER — IBUPROFEN 200 MG PO TABS: 400.0000 mg | ORAL_TABLET | Freq: Four times a day (QID) | ORAL | Status: DC | PRN

## 2017-09-11 MED ORDER — SULFAMETHOXAZOLE-TRIMETHOPRIM 200-40 MG/5ML PO SUSP: 10.0000 mL | Freq: Two times a day (BID) | ORAL | 0 refills | Status: AC

## 2017-09-11 MED ORDER — SULFAMETHOXAZOLE-TRIMETHOPRIM 400-80 MG PO TABS
1.0000 | ORAL_TABLET | Freq: Two times a day (BID) | ORAL | Status: DC
  Filled 2017-09-11: qty 1

## 2017-09-11 MED ORDER — ACETAMINOPHEN 650 MG RE SUPP: 650.0000 mg | Freq: Four times a day (QID) | RECTAL | Status: DC | PRN

## 2017-09-11 MED ORDER — LEVETIRACETAM 100 MG/ML PO SOLN: 500.0000 mg | Freq: Two times a day (BID) | ORAL | 0 refills | Status: AC

## 2017-09-11 MED ORDER — ASPIRIN 325 MG PO TABS
325.0000 mg | ORAL_TABLET | Freq: Every day | ORAL | Status: DC
  Administered 2017-09-11: 325 mg via ORAL
  Filled 2017-09-11: qty 1

## 2017-09-11 MED ORDER — LACTULOSE 10 GM/15ML PO SOLN
10.0000 g | Freq: Every day | ORAL | Status: DC
  Administered 2017-09-11: 10 g via ORAL
  Filled 2017-09-11: qty 15

## 2017-09-11 MED ORDER — ONDANSETRON HCL 4 MG PO TABS: 4.0000 mg | ORAL_TABLET | Freq: Four times a day (QID) | ORAL | Status: DC | PRN

## 2017-09-11 MED ORDER — CHLORHEXIDINE GLUCONATE 0.12 % MT SOLN
15.0000 mL | Freq: Two times a day (BID) | OROMUCOSAL | Status: DC
  Administered 2017-09-11: 15 mL via OROMUCOSAL
  Filled 2017-09-11: qty 15

## 2017-09-11 MED ORDER — ATORVASTATIN CALCIUM 10 MG PO TABS
10.0000 mg | ORAL_TABLET | Freq: Every day | ORAL | Status: DC
  Administered 2017-09-11: 10 mg via ORAL
  Filled 2017-09-11: qty 1

## 2017-09-11 NOTE — Progress Notes (Signed)
Physical Therapy Treatment Patient Details Name: [name] [name] MRN: [id number] DOB: 1941/03/21 Today's Date: 09/11/2017    History of Present Illness Patient is a 76 y/o female who presents with witnessed seizure and now post ictal. Pt on continuous EEG. PMH includes anemia, CKD, depression, GERD, HLD, HTN, SZR, DM, Hip fracture.    PT Comments    Still limited by falling BP, frequent uncontrolled watery stools and inability to follow command, needing physical direction.  Emphasis continues to be transitions, sitting balance, standing and finally transfer today    Follow Up Recommendations  Home health PT;Supervision/Assistance - 24 hour(pt's husband declinaed)     Equipment Recommendations  Hospital bed    Recommendations for Other Services       Precautions / Restrictions Precautions Precautions: Fall Precaution Comments: wary of falling BP's    Mobility  Bed Mobility Overal bed mobility: Needs Assistance Bed Mobility: Rolling;Sidelying to Sit;Sit to Supine Rolling: Mod assist;Max assist Sidelying to sit: Max assist;+2 for safety/equipment     Sit to sidelying: +2 for physical assistance;Total assist General bed mobility comments: If cued enough and taken slow enough, but tries to help, but minimally.  Laying down has always taken more assist due to pt usually has alot of stool on her.  Transfers Overall transfer level: Needs assistance Equipment used: Rolling walker (2 wheeled) Transfers: Sit to/from Stand Sit to Stand: Mod assist;+2 safety/equipment         General transfer comment: pt passed out both times she was up.  Standing at EOB and squat pivot to the chair  Ambulation/Gait                 Stairs            Wheelchair Mobility    Modified Rankin (Stroke Patients Only)       Balance Overall balance assessment: Needs assistance Sitting-balance support: Feet supported;No upper extremity supported Sitting balance-Leahy Scale:  Fair Sitting balance - Comments: fatigued in sitting more than previous times.  pt could maintain sitting, but started listing Left with fatigue.     Standing balance-Leahy Scale: Poor Standing balance comment: needing moderate assist for stability                            Cognition Arousal/Alertness: Awake/alert Behavior During Therapy: Flat affect Overall Cognitive Status: Impaired/Different from baseline Area of Impairment: Attention;Following commands                 Orientation Level: Disoriented to;Time;Situation;Place Current Attention Level: Focused   Following Commands: Follows one step commands inconsistently;Follows one step commands with increased time   Awareness: Intellectual Problem Solving: Slow processing;Decreased initiation;Difficulty sequencing;Requires verbal cues;Requires tactile cues        Exercises      General Comments General comments (skin integrity, edema, etc.): HR maintained in the 110's or less.      Pertinent Vitals/Pain Pain Assessment: Faces Faces Pain Scale: Hurts even more Pain Location: right leg movement Pain Descriptors / Indicators: Grimacing Pain Intervention(s): Monitored during session    Home Living                      Prior Function            PT Goals (current goals can now be found in the care plan section) Acute Rehab PT Goals Patient Stated Goal: to get her to wake up and come back home PT Goal  Formulation: With family Time For Goal Achievement: 09/10/17 Potential to Achieve Goals: Fair Progress towards PT goals: Progressing toward goals    Frequency    Min 2X/week      PT Plan Discharge plan needs to be updated    Co-evaluation              AM-PAC PT "6 Clicks" Daily Activity  Outcome Measure  Difficulty turning over in bed (including adjusting bedclothes, sheets and blankets)?: Unable Difficulty moving from lying on back to sitting on the side of the bed? :  Unable Difficulty sitting down on and standing up from a chair with arms (e.g., wheelchair, bedside commode, etc,.)?: Unable Help needed moving to and from a bed to chair (including a wheelchair)?: A Lot Help needed walking in hospital room?: A Lot Help needed climbing 3-5 steps with a railing? : Total 6 Click Score: 8    End of Session   Activity Tolerance: Patient tolerated treatment well Patient left: in chair;with call bell/phone within reach   PT Visit Diagnosis: Unsteadiness on feet (R26.81);Other abnormalities of gait and mobility (R26.89);Other (comment)(repeat BP falling)     Time: 0923-3007 PT Time Calculation (min) (ACUTE ONLY): 43 min  Charges:  $Therapeutic Activity: 23-37 mins $Self Care/Home Management: 8-22                    G Codes:       09/25/2017  Donnella Sham, PT [phone]  (pager)   Tessie Fass Aeneas [place] 09/08/2017, 1:30 PM

## 2017-09-11 NOTE — Progress Notes (Signed)
No charge note.  Patient very appropriate for hospice services at home.  Husband is simply not ready for these services.  He has accepted Palliative care to follow at home. (once q 6 weeks).   Unfortunately,  I anticipate she will return to the hospital relatively soon after discharge due to dehydration.  PMT will sign off.    If patient declines further during the hospitalization or if husband has any concerns please do not hesitate to call us back.  Patient's family has our contact information.   Florentina Jenny, PA-C Palliative Medicine Office [phone] (7a - 7p)

## 2017-09-11 NOTE — Discharge Summary (Addendum)
Discharge Summary  [name] [name] QVZ:[id number] DOB: 17-Jan-1941  PCP: [name], MD  Admit date: 08/26/2017 Discharge date: 09/11/2017  Time spent: 25 minutes  Recommendations for Outpatient Follow-up:  1. Follow up with your PCP within a week 2. Follow up with orthopedic surgery Dr. Ninfa Linden within a week 3. Follow up with neurology within a week 4. Take your medications as prescribed 5. Repeat CBC on Monday 09/14/17 to monitor hemoglobin  Discharge Diagnoses:  Active Hospital Problems   Diagnosis Date Noted  . Fever   . Purulent dermatitis   . Rhonchi   . Palliative care encounter   . Encounter for hospice care discussion   . DNR (do not resuscitate) discussion   . Postoperative wound infection of right hip   . Status epilepticus ([place]) 08/26/2017  . S/P right hip fracture 08/26/2017  . Sepsis, unspecified organism ([place]) 06/05/2017  . UTI (urinary tract infection) 11/15/2016  . Seizure disorder, grand mal ([place]) 08/20/2012  . Acute encephalopathy 08/19/2012  . CKD (chronic kidney disease) stage 3, GFR 30-59 ml/min (HCC) 08/19/2012  . HTN (hypertension) 08/18/2012  . DM type 2 (diabetes mellitus, type 2) ([place]) 07/26/2012  . GERD (gastroesophageal reflux disease)     Resolved Hospital Problems  No resolved problems to display.    Discharge Condition: Stable  Diet recommendation: Puree and thin liquids  Vitals:   09/11/17 1556 09/11/17 1637  BP: (!) 132/93   Pulse: (!) 121 (!) 128  Resp: (!) 22   Temp: 99.1 F (37.3 C) 100 F (37.8 C)  SpO2: 100%     History of present illness:  is a 76 y.o. female with medical history significant of anemia, CKD, depression, GERD, HLD, HTN, SZR, DM, Hip fracture.   Level 5 caveat applies. Patient unable to provide any history due to being somnolent due to being post ictal from extended seizure time. History provided by patient's husband who is her primary caregiver. Patient was in her normal state of health until onset of  seizure on day of admission. Patient's husband reports waking up in bed in the middle of night to find his wife having a seizure. She was shaking all over. She was unresponsive. Reports shaking for approximately 30-45 minutes. Relieved w/ Ativan. Denies any recent change in her baseline health status including fevers, chest pain, shortness breath, palpitations, dysuria, frequency, flank pain, fevers, cough, neck stiffness, focal neurological deficits. Reports patient doing fairly well from a rehabilitation standpoint after recent hip fracture. Using tramadol for pain. She is not previously on tramadol.  Patient brought in status epilepticus. Given Ativan and loading dose of Keppra. Neurology consulted and followed the patient. She had continuous EEG for 3-4 days with no sign of recurrent seizures. She continues to do well on Keppra 500 mg BID.  Altered mental state was persistent up until 4 days ago when she became more alert, and interactive.  Superficial wound with cellulitis at site of surgical incision at right hip. Treated with IV vancomycin and IV zosyn, then zosyn was stopped and she continued with IV zosyn with improvement of symptoms.  Had a coretrack for about a week. Patient's husband declined peg tube placement. Passed swallow evaluation by speech therapist with recommendation for puree consistency and thin liquids.  On the day of discharge the patient was hemodynamically stable. She was alert and oriented to self, place, and circumstances. She will need to follow up with her PCP, ortho surgery, and neurology within a week. Also will need extensive rehabilitation with  PT, OT, speech, and nursing assistance.   Hospital Course:  Active Problems:   GERD (gastroesophageal reflux disease)   DM type 2 (diabetes mellitus, type 2) (HCC)   HTN (hypertension)   CKD (chronic kidney disease) stage 3, GFR 30-59 ml/min (HCC)   Acute encephalopathy   Seizure disorder, grand mal ([place])   UTI  (urinary tract infection)   Sepsis, unspecified organism ([place])   Status epilepticus ([place])   S/P right hip fracture   Postoperative wound infection of right hip   Palliative care encounter   Encounter for hospice care discussion   DNR (do not resuscitate) discussion   Fever   Purulent dermatitis   Rhonchi  Altered mental status, most likely multifactorial 2/2 to seizure disorder vs sepsis vs hyperammonemia vs others  -waxes and wanes -More talkative and interactive, moves limbs and follows simple commands  SIRS with unclear source of infection -Resolved, most likely reactive  Dysphagia - NG tube removed on 09/08/17 per husband request -Husband also declines peg tube placement -speech therapist recommends puree thin liquids  Seizure disorder -No recurrent witnessed seizures -Follow up with neurology as outpatient -No seizures on continuous EEG -Keppra 500 mg BIDpo   Macrocytic anemia -Hemoglobin 7.7 (09/11/17) -1 U PRBC transfused 09/11/17 -repeat CBC on Monday 09/14/17  Sepsis 2/2 to superficial right hipinfectionvs others -resolved -mrsa+ 08/27/17 -bactrim 1 dose BID x 7 days (suspension)  Hyperammonemia -ammonia level21 (09/08/17) - LP 10/25 per Neuro unrevealing  -24 hour EEGwith no recurrent seizures  Chronic Diastolic CHF -I&O's -Daily weight -Metoprolol  Essential HTN -stable -Metoprolol  CAD -ASA, lipitor, lopressor  CKD stage III -Cr 1.17 -Avoid nephrotoxic meds -Hydration  Hypokalemia -Potassium goal 4 -Resolved  Hypomagnesemia -Magnesium goal 2 - Resolved  RIGHT Hip Fracture -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  Diabetes Type 2 controlled with renal complication / Hypoglycemia A1C=6.5 (08/26/17) -suffered an episode of hypoglycemia during this hospitalization.  Severe protein calorie malnutrition in context of chronic illness -CorTrak tube placed 10/31.Removed  09/08/17 -Husband declines peg tube placement -aspiration precaution  Severe deconditioning/weakness -continue PT/OT and increase protein calorie intake  Ambulatory dysfunction -PT /OT    Procedures:  Right hip I&D from superficial cellulitis  Consultations:  Ortho, GI, neuro  Discharge Exam: BP (!) 132/93   Pulse (!) 128   Temp 100 F (37.8 C) (Oral)   Resp (!) 22   Ht 5\' 4"  (1.626 m)   Wt 58.1 kg (128 lb)   SpO2 100%   BMI 21.97 kg/m   General: 76 yo CF WD WN NAD  Cardiovascular: RRR with no murmurs rubs or gallops Respiratory: CTA with no wheezes or rales  Discharge Instructions You were cared for by a hospitalist during your hospital stay. If you have any questions about your discharge medications or the care you received while you were in the hospital after you are discharged, you can call the unit and asked to speak with the hospitalist on call if the hospitalist that took care of you is not available. Once you are discharged, your primary care physician will handle any further medical issues. Please note that NO REFILLS for any discharge medications will be authorized once you are discharged, as it is imperative that you return to your primary care physician (or establish a relationship with a primary care physician if you do not have one) for your aftercare needs so that they can reassess your need for medications and monitor your lab values.  Discharge Instructions  Face-to-face encounter (required for Medicare/Medicaid patients)   Complete by:  As directed    I Kayleen Memos certify that this patient is under my care and that I, or a nurse practitioner or physician's assistant working with me, had a face-to-face encounter that meets the physician face-to-face encounter requirements with this patient on 09/10/2017. The encounter with the patient was in whole, or in part for the following medical condition(s) which is the primary reason for home health care (List  medical condition): Ambulatory dysfunction, severe physical debilitation, dysphagia, seizures, history of stroke, confusion.   The encounter with the patient was in whole, or in part, for the following medical condition, which is the primary reason for home health care:  ambulatory dysfunction, severe physical debilitation   I certify that, based on my findings, the following services are medically necessary home health services:   Nursing Physical therapy Speech language pathology     Reason for Medically Necessary Home Health Services:  Skilled Nursing- Change/Decline in Patient Status   My clinical findings support the need for the above services:  Cognitive impairments, dementia, or mental confusion  that make it unsafe to leave home   Further, I certify that my clinical findings support that this patient is homebound due to:  Unable to leave home safely without assistance   Home Health   Complete by:  As directed    To provide the following care/treatments:   PT OT Villalba RN Social work       Allergies as of 09/11/2017      Reactions   Barbiturates    Becomes restless.  Also with all "strong" medications like sleeping pills   Latex Itching      Medication List    STOP taking these medications   acetaminophen 325 MG tablet Commonly known as:  TYLENOL   divalproex 250 MG 24 hr tablet Commonly known as:  DEPAKOTE ER     TAKE these medications   aspirin 325 MG EC tablet Take 1 tablet (325 mg total) by mouth daily with breakfast.   atorvastatin 10 MG tablet Commonly known as:  LIPITOR Take 10 mg by mouth daily.   LEVEMIR FLEXTOUCH 100 UNIT/ML Pen Generic drug:  Insulin Detemir INJECT 10 UNITS SUBCUTANEOUSLY DAILY AT NIGHT What changed:    how much to take  how to take this  when to take this  additional instructions   levETIRAcetam 100 MG/ML solution Commonly known as:  KEPPRA Take 5 mLs (500 mg total) 2 (two) times daily by mouth.     Magnesium 500 MG Tabs Take 1 tablet (500 mg total) by mouth 2 (two) times daily.   metoprolol succinate 25 MG 24 hr tablet Commonly known as:  TOPROL-XL Take 1 tablet (25 mg total) by mouth daily.   multivitamin capsule Take 1 capsule by mouth daily.   NOVOLOG FLEXPEN 100 UNIT/ML FlexPen Generic drug:  insulin aspart Inject 4-9 Units into the skin 3 (three) times daily with meals. Per sliding scale, 4 units if over 200 = 5 units in the morning and at lunch and 8 units if over 200 = 9 units.   omeprazole 20 MG capsule Commonly known as:  PRILOSEC Take 20 mg by mouth every morning.   ondansetron 4 MG disintegrating tablet Commonly known as:  ZOFRAN ODT Take 1 tablet (4 mg total) by mouth every 8 (eight) hours as needed for nausea or vomiting.   sulfamethoxazole-trimethoprim 200-40 MG/5ML suspension Commonly known as:  BACTRIM,SEPTRA Take 10 mLs every 12 (twelve) hours for 7 days by mouth.   vitamin C 500 MG tablet Commonly known as:  ASCORBIC ACID Take 500 mg by mouth daily.            Durable Medical Equipment  (From admission, onward)        Start     Ordered   09/09/17 1416  For home use only DME Hospital bed  Once    Question Answer Comment  The above medical condition requires: Patient requires the ability to reposition frequently   Head must be elevated greater than: 45 degrees   Bed type Semi-electric   [employer] Yes   [place] Yes      09/09/17 1416     Allergies  Allergen Reactions  . Barbiturates     Becomes restless.  Also with all "strong" medications like sleeping pills  . Latex Itching   Follow-up Information    [facility] Follow up.   Specialty:  Home Health Services Why:  Registered Nurse, Physical/Occupational Therapy, Speech Therapy, Aide, Social Worker.  Contact information: [place] [address] [phone]        [facility] Follow up.   Specialty:  Hospice and  Palliative Medicine Why:  Palliative Care Services from this location to call you at home to discuss services.  Contact information: [place] [id number] [facility].   Why:  Hospital Bed Contact information: [address] [place] [address] [phone]            The results of significant diagnostics from this hospitalization (including imaging, microbiology, ancillary and laboratory) are listed below for reference.    Significant Diagnostic Studies: Ct Head Wo Contrast  Result Date: 08/26/2017 CLINICAL DATA:  Acute onset of altered level of consciousness. Initial encounter. EXAM: CT HEAD WITHOUT CONTRAST TECHNIQUE: Contiguous axial images were obtained from the base of the skull through the vertex without intravenous contrast. COMPARISON:  CT of the head performed 06/11/2017 FINDINGS: Brain: No evidence of acute infarction, hemorrhage, hydrocephalus, extra-axial collection or mass lesion/mass effect. Prominence of the ventricles and sulci reflects mild to moderate cortical volume loss. Mild periventricular and subcortical white matter change likely reflects small vessel ischemic microangiopathy. The brainstem and fourth ventricle are within normal limits. The basal ganglia are unremarkable in appearance. The cerebral hemispheres demonstrate grossly normal gray-white differentiation. No mass effect or midline shift is seen. Vascular: No hyperdense vessel or unexpected calcification. Skull: There is no evidence of fracture; visualized osseous structures are unremarkable in appearance. Sinuses/Orbits: The orbits are within normal limits. The paranasal sinuses and mastoid air cells are well-aerated. Other: A 2.4 cm soft tissue nodule is noted at the left posterior nose, superficial to the medial left maxilla. This has increased mildly in size over the last few years. Cerumen is noted at the external auditory  canals bilaterally. IMPRESSION: 1. No acute intracranial pathology seen on CT. 2. Mild to moderate cortical volume loss and scattered small vessel ischemic microangiopathy. 3. 2.4 cm soft tissue nodule at the left posterior nose, superficial to the medial left maxilla. This has increased mildly in size over the last few years. 4. Cerumen at the external auditory canals bilaterally. Electronically Signed   By: Garald Balding M.D.   On: 08/26/2017 04:52   Dg Chest Port 1 View  Result Date:  09/10/2017 CLINICAL DATA:  New onset fever. EXAM: PORTABLE CHEST 1 VIEW COMPARISON:  Chest x-ray dated 09/05/2017. FINDINGS: Heart size and mediastinal contours are within normal limits. Atherosclerotic changes noted at the aortic arch. Left-sided PICC line in place with tip at the level of the upper SVC. Lungs are clear. No pleural effusion or pneumothorax seen. No acute or suspicious osseous finding. IMPRESSION: 1. No active disease.  No evidence of pneumonia or pulmonary edema. 2. Left-sided PICC line in place with tip at the level of the upper SVC. Consider advancing approximately 5 cm for more optimal radiographic positioning. 3. Aortic atherosclerosis. These results will be called to the ordering clinician or representative by the Radiologist Assistant, and communication documented in the PACS or zVision Dashboard. Electronically Signed   By: Franki Cabot M.D.   On: 09/10/2017 15:48   Dg Chest Port 1 View  Result Date: 09/05/2017 CLINICAL DATA:  76 year old female with a history of respiratory failure EXAM: PORTABLE CHEST 1 VIEW COMPARISON:  08/26/2017 FINDINGS: Cardiomediastinal silhouette unchanged. Compare to the prior there is mild increase prominence of the interstitial markings with interlobular septal thickening. No confluent airspace disease. No pneumothorax or pleural effusion. Persistent pleuroparenchymal scarring at the lung apices. Enteric feeding tube terminates out of the field of view. No displaced  fracture. IMPRESSION: Coarsened interstitial markings with mild interlobular septal thickening may represent chronic changes, however, early pulmonary edema not excluded. No evidence of multifocal pneumonia. Enteric tube terminates out of the field of view. Electronically Signed   By: Corrie Mckusick D.O.   On: 09/05/2017 09:32   Dg Chest Port 1 View  Result Date: 08/26/2017 CLINICAL DATA:  Acute onset of fever.  Initial encounter. EXAM: PORTABLE CHEST 1 VIEW COMPARISON:  Chest radiograph performed 07/27/2017 FINDINGS: The lungs are well-aerated and clear. There is no evidence of focal opacification, pleural effusion or pneumothorax. The cardiomediastinal silhouette is within normal limits. No acute osseous abnormalities are seen. IMPRESSION: No acute cardiopulmonary process seen. Electronically Signed   By: Garald Balding M.D.   On: 08/26/2017 04:22   Dg Hip Port Unilat With Pelvis 1v Right  Result Date: 09/05/2017 CLINICAL DATA:  Right hip drainage. Status post right hip replacement on 07/28/2017. Clinical diagnosis of purulent dermatitis. EXAM: DG HIP (WITH OR WITHOUT PELVIS) 1V PORT RIGHT COMPARISON:  07/28/2017 and 07/27/2017. FINDINGS: Right hip bipolar prosthesis in satisfactory position and alignment. No bone destruction, periosteal reaction or periprosthetic lucency. No soft tissue gas. Mild left hip degenerative changes. IMPRESSION: No acute abnormality. Electronically Signed   By: Claudie Revering M.D.   On: 09/05/2017 08:52    Microbiology: Recent Results (from the past 240 hour(s))  Culture, blood (routine x 2)     Status: None   Collection Time: 09/05/17  1:52 AM  Result Value Ref Range Status   Specimen Description BLOOD LEFT HAND  Final   Special Requests   Final    BOTTLES DRAWN AEROBIC AND ANAEROBIC Blood Culture adequate volume   Culture NO GROWTH 5 DAYS  Final   Report Status 09/10/2017 FINAL  Final  Culture, blood (routine x 2)     Status: None   Collection Time: 09/05/17  1:52  AM  Result Value Ref Range Status   Specimen Description BLOOD RIGHT ANTECUBITAL  Final   Special Requests   Final    BOTTLES DRAWN AEROBIC AND ANAEROBIC Blood Culture adequate volume   Culture NO GROWTH 5 DAYS  Final   Report Status 09/10/2017 FINAL  Final  Aerobic/Anaerobic Culture (surgical/deep wound)     Status: None   Collection Time: 09/06/17  8:07 AM  Result Value Ref Range Status   Specimen Description TISSUE HIP RIGHT  Final   Special Requests SPEC A ON SWABS  Final   Gram Stain   Final    FEW WBC PRESENT,BOTH PMN AND MONONUCLEAR RARE GRAM POSITIVE COCCI IN PAIRS IN CLUSTERS    Culture No growth aerobically or anaerobically.  Final   Report Status 09/11/2017 FINAL  Final  Culture, blood (routine x 2)     Status: None (Preliminary result)   Collection Time: 09/10/17  3:41 PM  Result Value Ref Range Status   Specimen Description BLOOD RIGHT WRIST  Final   Special Requests IN PEDIATRIC BOTTLE Blood Culture adequate volume  Final   Culture NO GROWTH < 24 HOURS  Final   Report Status PENDING  Incomplete  Culture, blood (routine x 2)     Status: None (Preliminary result)   Collection Time: 09/10/17  3:53 PM  Result Value Ref Range Status   Specimen Description BLOOD RIGHT ARM  Final   Special Requests IN PEDIATRIC BOTTLE Blood Culture adequate volume  Final   Culture NO GROWTH < 24 HOURS  Final   Report Status PENDING  Incomplete     Labs: Basic Metabolic Panel: Recent Labs  Lab 09/05/17 0534 09/06/17 0237 09/07/17 0429 09/08/17 0630 09/09/17 0340 09/10/17 0547 09/11/17 0543  NA 142 143 137 140 140 137 139  K 5.0 4.1 4.1 3.6 3.4* 3.6 3.4*  CL 107 108 105 103 100* 102 104  CO2 28 26 23 30 30 26 25   GLUCOSE 312* 47* 251* 178* 163* 152* 129*  BUN 8 13 17 14 11 8 8   CREATININE 1.10* 1.38* 1.40* 1.30* 1.33* 1.18* 1.17*  CALCIUM 8.3* 8.7* 7.8* 8.1* 8.5* 8.3* 8.0*  MG 1.0* 1.5*  --   --   --   --   --   PHOS 2.4* 3.3  --   --   --   --   --    Liver Function  Tests: Recent Labs  Lab 09/08/17 0630  AST 22  ALT 10*  ALKPHOS 139*  BILITOT 0.4  PROT 5.5*  ALBUMIN 1.8*   No results for input(s): LIPASE, AMYLASE in the last 168 hours. Recent Labs  Lab 09/05/17 0205 09/08/17 0630  AMMONIA 25 21   CBC: Recent Labs  Lab 09/07/17 1019 09/08/17 0630 09/09/17 0340 09/10/17 0547 09/11/17 0543  WBC 10.2 9.4 11.8* 13.1* 13.0*  NEUTROABS  --   --   --   --  6.9  HGB 7.4* 7.3* 8.1* 8.8* 7.7*  HCT 24.0* 23.9* 26.1* 28.1* 25.3*  MCV 101.7* 102.6* 100.4* 102.6* 102.0*  PLT 491* 509* 615* 720* 662*   Cardiac Enzymes: No results for input(s): CKTOTAL, CKMB, CKMBINDEX, TROPONINI in the last 168 hours. BNP: BNP (last 3 results) No results for input(s): BNP in the last 8760 hours.  ProBNP (last 3 results) No results for input(s): PROBNP in the last 8760 hours.  CBG: Recent Labs  Lab 09/10/17 2345 09/11/17 0416 09/11/17 0740 09/11/17 1147 09/11/17 1636  GLUCAP 155* 85 163* 164* 149*       Signed:  Kayleen Memos, MD [facility] 09/11/2017, 4:49 PM

## 2017-09-12 LAB — BPAM RBC
BLOOD PRODUCT EXPIRATION DATE: 201811192359
ISSUE DATE / TIME: 201811091420
Unit Type and Rh: 9500

## 2017-09-12 LAB — TYPE AND SCREEN
ABO/RH(D): O NEG
ANTIBODY SCREEN: NEGATIVE
Unit division: 0

## 2017-09-15 LAB — CULTURE, BLOOD (ROUTINE X 2)
CULTURE: NO GROWTH
CULTURE: NO GROWTH
SPECIAL REQUESTS: ADEQUATE
Special Requests: ADEQUATE

## 2017-09-22 ENCOUNTER — Telehealth (INDEPENDENT_AMBULATORY_CARE_PROVIDER_SITE_OTHER): Payer: Self-pay | Admitting: Orthopaedic Surgery

## 2017-09-22 ENCOUNTER — Inpatient Hospital Stay (INDEPENDENT_AMBULATORY_CARE_PROVIDER_SITE_OTHER): Payer: Federal, State, Local not specified - PPO | Admitting: Orthopaedic Surgery

## 2017-09-22 NOTE — Telephone Encounter (Signed)
Ok

## 2017-09-22 NOTE — Telephone Encounter (Signed)
Shanon Brow from [facility] called stating that the patient's is pretty weak and will probably refuse transfer to get to the doctor's appointments.  He is wondering if they could get an order to remove the staples at home through home health.  He will be off tomorrow and requests for you to call the Clinical Manager Leda Gauze at [phone].  Thank you.

## 2017-09-22 NOTE — Telephone Encounter (Signed)
It will be ok for them to remove her sutures/staples

## 2017-09-23 ENCOUNTER — Encounter (HOSPITAL_COMMUNITY): Payer: Self-pay | Admitting: Emergency Medicine

## 2017-09-23 ENCOUNTER — Emergency Department (HOSPITAL_COMMUNITY): Payer: Medicare Other

## 2017-09-23 ENCOUNTER — Inpatient Hospital Stay (HOSPITAL_COMMUNITY)
Admission: EM | Admit: 2017-09-23 | Discharge: 2017-09-27 | DRG: 100 | Disposition: A | Discharge status: Hospice | Payer: Medicare Other | Attending: Family Medicine | Admitting: Family Medicine

## 2017-09-23 DIAGNOSIS — G40309 Generalized idiopathic epilepsy and epileptic syndromes, not intractable, without status epilepticus: Secondary | ICD-10-CM

## 2017-09-23 DIAGNOSIS — Z800 Family history of malignant neoplasm of digestive organs: Secondary | ICD-10-CM

## 2017-09-23 DIAGNOSIS — S37009A Unspecified injury of unspecified kidney, initial encounter: Secondary | ICD-10-CM

## 2017-09-23 DIAGNOSIS — R4702 Dysphasia: Secondary | ICD-10-CM | POA: Diagnosis present

## 2017-09-23 DIAGNOSIS — E1140 Type 2 diabetes mellitus with diabetic neuropathy, unspecified: Secondary | ICD-10-CM

## 2017-09-23 DIAGNOSIS — I5032 Chronic diastolic (congestive) heart failure: Secondary | ICD-10-CM | POA: Diagnosis present

## 2017-09-23 DIAGNOSIS — Z794 Long term (current) use of insulin: Secondary | ICD-10-CM | POA: Diagnosis not present

## 2017-09-23 DIAGNOSIS — L080 Pyoderma: Secondary | ICD-10-CM | POA: Diagnosis present

## 2017-09-23 DIAGNOSIS — G9340 Encephalopathy, unspecified: Secondary | ICD-10-CM | POA: Diagnosis not present

## 2017-09-23 DIAGNOSIS — R55 Syncope and collapse: Secondary | ICD-10-CM | POA: Diagnosis not present

## 2017-09-23 DIAGNOSIS — Z96641 Presence of right artificial hip joint: Secondary | ICD-10-CM | POA: Diagnosis present

## 2017-09-23 DIAGNOSIS — N179 Acute kidney failure, unspecified: Secondary | ICD-10-CM | POA: Diagnosis not present

## 2017-09-23 DIAGNOSIS — F418 Other specified anxiety disorders: Secondary | ICD-10-CM | POA: Diagnosis present

## 2017-09-23 DIAGNOSIS — I959 Hypotension, unspecified: Secondary | ICD-10-CM | POA: Diagnosis not present

## 2017-09-23 DIAGNOSIS — Z803 Family history of malignant neoplasm of breast: Secondary | ICD-10-CM

## 2017-09-23 DIAGNOSIS — R4020 Unspecified coma: Secondary | ICD-10-CM | POA: Diagnosis present

## 2017-09-23 DIAGNOSIS — N189 Chronic kidney disease, unspecified: Secondary | ICD-10-CM | POA: Diagnosis not present

## 2017-09-23 DIAGNOSIS — E1142 Type 2 diabetes mellitus with diabetic polyneuropathy: Secondary | ICD-10-CM

## 2017-09-23 DIAGNOSIS — R092 Respiratory arrest: Secondary | ICD-10-CM | POA: Diagnosis present

## 2017-09-23 DIAGNOSIS — R404 Transient alteration of awareness: Secondary | ICD-10-CM

## 2017-09-23 DIAGNOSIS — G40409 Other generalized epilepsy and epileptic syndromes, not intractable, without status epilepticus: Principal | ICD-10-CM | POA: Diagnosis present

## 2017-09-23 DIAGNOSIS — E7800 Pure hypercholesterolemia, unspecified: Secondary | ICD-10-CM | POA: Diagnosis present

## 2017-09-23 DIAGNOSIS — I7389 Other specified peripheral vascular diseases: Secondary | ICD-10-CM | POA: Diagnosis present

## 2017-09-23 DIAGNOSIS — E118 Type 2 diabetes mellitus with unspecified complications: Secondary | ICD-10-CM

## 2017-09-23 DIAGNOSIS — Z9841 Cataract extraction status, right eye: Secondary | ICD-10-CM

## 2017-09-23 DIAGNOSIS — E785 Hyperlipidemia, unspecified: Secondary | ICD-10-CM | POA: Diagnosis present

## 2017-09-23 DIAGNOSIS — I2510 Atherosclerotic heart disease of native coronary artery without angina pectoris: Secondary | ICD-10-CM | POA: Diagnosis present

## 2017-09-23 DIAGNOSIS — R6510 Systemic inflammatory response syndrome (SIRS) of non-infectious origin without acute organ dysfunction: Secondary | ICD-10-CM

## 2017-09-23 DIAGNOSIS — G9341 Metabolic encephalopathy: Secondary | ICD-10-CM | POA: Diagnosis present

## 2017-09-23 DIAGNOSIS — E1122 Type 2 diabetes mellitus with diabetic chronic kidney disease: Secondary | ICD-10-CM | POA: Diagnosis present

## 2017-09-23 DIAGNOSIS — E872 Acidosis: Secondary | ICD-10-CM | POA: Diagnosis present

## 2017-09-23 DIAGNOSIS — K219 Gastro-esophageal reflux disease without esophagitis: Secondary | ICD-10-CM | POA: Diagnosis not present

## 2017-09-23 DIAGNOSIS — Z961 Presence of intraocular lens: Secondary | ICD-10-CM | POA: Diagnosis present

## 2017-09-23 DIAGNOSIS — Z833 Family history of diabetes mellitus: Secondary | ICD-10-CM

## 2017-09-23 DIAGNOSIS — Z7982 Long term (current) use of aspirin: Secondary | ICD-10-CM

## 2017-09-23 DIAGNOSIS — Z8673 Personal history of transient ischemic attack (TIA), and cerebral infarction without residual deficits: Secondary | ICD-10-CM

## 2017-09-23 DIAGNOSIS — E860 Dehydration: Secondary | ICD-10-CM | POA: Diagnosis present

## 2017-09-23 DIAGNOSIS — E1149 Type 2 diabetes mellitus with other diabetic neurological complication: Secondary | ICD-10-CM | POA: Diagnosis not present

## 2017-09-23 DIAGNOSIS — N183 Chronic kidney disease, stage 3 (moderate): Secondary | ICD-10-CM | POA: Diagnosis present

## 2017-09-23 DIAGNOSIS — I700 Atherosclerosis of aorta: Secondary | ICD-10-CM | POA: Diagnosis present

## 2017-09-23 DIAGNOSIS — I130 Hypertensive heart and chronic kidney disease with heart failure and stage 1 through stage 4 chronic kidney disease, or unspecified chronic kidney disease: Secondary | ICD-10-CM | POA: Diagnosis present

## 2017-09-23 DIAGNOSIS — E2740 Unspecified adrenocortical insufficiency: Secondary | ICD-10-CM | POA: Diagnosis present

## 2017-09-23 DIAGNOSIS — E876 Hypokalemia: Secondary | ICD-10-CM | POA: Diagnosis present

## 2017-09-23 DIAGNOSIS — E11649 Type 2 diabetes mellitus with hypoglycemia without coma: Secondary | ICD-10-CM | POA: Diagnosis present

## 2017-09-23 DIAGNOSIS — Z91040 Latex allergy status: Secondary | ICD-10-CM

## 2017-09-23 DIAGNOSIS — Z9842 Cataract extraction status, left eye: Secondary | ICD-10-CM

## 2017-09-23 DIAGNOSIS — Z888 Allergy status to other drugs, medicaments and biological substances status: Secondary | ICD-10-CM

## 2017-09-23 LAB — I-STAT ARTERIAL BLOOD GAS, ED
ACID-BASE EXCESS: 1 mmol/L (ref 0.0–2.0)
BICARBONATE: 26.6 mmol/L (ref 20.0–28.0)
O2 SAT: 100 %
PO2 ART: 317 mmHg — AB (ref 83.0–108.0)
Patient temperature: 97.4
TCO2: 28 mmol/L (ref 22–32)
pCO2 arterial: 41.9 mmHg (ref 32.0–48.0)
pH, Arterial: 7.407 (ref 7.350–7.450)

## 2017-09-23 LAB — URINALYSIS, ROUTINE W REFLEX MICROSCOPIC
BILIRUBIN URINE: NEGATIVE
GLUCOSE, UA: 50 mg/dL — AB
HGB URINE DIPSTICK: NEGATIVE
Ketones, ur: NEGATIVE mg/dL
Leukocytes, UA: NEGATIVE
Nitrite: NEGATIVE
PROTEIN: NEGATIVE mg/dL
Specific Gravity, Urine: 1.014 (ref 1.005–1.030)
pH: 5 (ref 5.0–8.0)

## 2017-09-23 LAB — COMPREHENSIVE METABOLIC PANEL
ALT: 22 U/L (ref 14–54)
AST: 55 U/L — ABNORMAL HIGH (ref 15–41)
Albumin: 2.5 g/dL — ABNORMAL LOW (ref 3.5–5.0)
Alkaline Phosphatase: 164 U/L — ABNORMAL HIGH (ref 38–126)
Anion gap: 17 — ABNORMAL HIGH (ref 5–15)
BILIRUBIN TOTAL: 0.6 mg/dL (ref 0.3–1.2)
BUN: 21 mg/dL — AB (ref 6–20)
CHLORIDE: 96 mmol/L — AB (ref 101–111)
CO2: 20 mmol/L — ABNORMAL LOW (ref 22–32)
CREATININE: 1.97 mg/dL — AB (ref 0.44–1.00)
Calcium: 9.2 mg/dL (ref 8.9–10.3)
GFR, EST AFRICAN AMERICAN: 27 mL/min — AB (ref >60)
GFR, EST NON AFRICAN AMERICAN: 23 mL/min — AB (ref >60)
GLUCOSE: 360 mg/dL — AB (ref 65–99)
POTASSIUM: 4.1 mmol/L (ref 3.5–5.1)
Sodium: 133 mmol/L — ABNORMAL LOW (ref 135–145)
Total Protein: 6.9 g/dL (ref 6.5–8.1)

## 2017-09-23 LAB — CBC WITH DIFFERENTIAL/PLATELET
Basophils Absolute: 0 10*3/uL (ref 0.0–0.1)
Basophils Relative: 0 %
EOS ABS: 0.1 10*3/uL (ref 0.0–0.7)
EOS PCT: 1 %
HCT: 38.4 % (ref 36.0–46.0)
Hemoglobin: 12.2 g/dL (ref 12.0–15.0)
LYMPHS PCT: 25 %
Lymphs Abs: 2.5 10*3/uL (ref 0.7–4.0)
MCH: 31.9 pg (ref 26.0–34.0)
MCHC: 31.8 g/dL (ref 30.0–36.0)
MCV: 100.5 fL — AB (ref 78.0–100.0)
MONO ABS: 0.6 10*3/uL (ref 0.1–1.0)
Monocytes Relative: 6 %
Neutro Abs: 6.8 10*3/uL (ref 1.7–7.7)
Neutrophils Relative %: 68 %
PLATELETS: 251 10*3/uL (ref 150–400)
RBC: 3.82 MIL/uL — ABNORMAL LOW (ref 3.87–5.11)
RDW: 16.1 % — AB (ref 11.5–15.5)
WBC: 9.9 10*3/uL (ref 4.0–10.5)

## 2017-09-23 LAB — I-STAT CG4 LACTIC ACID, ED
LACTIC ACID, VENOUS: 7.88 mmol/L — AB (ref 0.5–1.9)
Lactic Acid, Venous: 1.42 mmol/L (ref 0.5–1.9)

## 2017-09-23 LAB — I-STAT CHEM 8, ED
BUN: 23 mg/dL — AB (ref 6–20)
CREATININE: 1.9 mg/dL — AB (ref 0.44–1.00)
Calcium, Ion: 1.2 mmol/L (ref 1.15–1.40)
Chloride: 101 mmol/L (ref 101–111)
GLUCOSE: 350 mg/dL — AB (ref 65–99)
HCT: 43 % (ref 36.0–46.0)
HEMOGLOBIN: 14.6 g/dL (ref 12.0–15.0)
POTASSIUM: 4.2 mmol/L (ref 3.5–5.1)
Sodium: 137 mmol/L (ref 135–145)
TCO2: 23 mmol/L (ref 22–32)

## 2017-09-23 LAB — I-STAT TROPONIN, ED: Troponin i, poc: 0.02 ng/mL (ref 0.00–0.08)

## 2017-09-23 MED ORDER — ZINC OXIDE 40 % EX OINT
1.0000 "application " | TOPICAL_OINTMENT | Freq: Three times a day (TID) | CUTANEOUS | Status: DC | PRN
  Filled 2017-09-23: qty 114

## 2017-09-23 MED ORDER — PIPERACILLIN-TAZOBACTAM 3.375 G IVPB 30 MIN
3.3750 g | Freq: Once | INTRAVENOUS | Status: AC
  Administered 2017-09-23: 3.375 g via INTRAVENOUS
  Filled 2017-09-23: qty 50

## 2017-09-23 MED ORDER — SODIUM CHLORIDE 0.9 % IV SOLN
INTRAVENOUS | Status: DC
  Administered 2017-09-24: 02:00:00 via INTRAVENOUS

## 2017-09-23 MED ORDER — VANCOMYCIN HCL 500 MG IV SOLR
500.0000 mg | INTRAVENOUS | Status: DC
  Administered 2017-09-24: 500 mg via INTRAVENOUS
  Filled 2017-09-23 (×2): qty 500

## 2017-09-23 MED ORDER — FLUDROCORTISONE ACETATE 0.1 MG PO TABS
0.1000 mg | ORAL_TABLET | Freq: Every day | ORAL | Status: DC
  Administered 2017-09-25 – 2017-09-27 (×3): 0.1 mg via ORAL
  Filled 2017-09-23 (×4): qty 1

## 2017-09-23 MED ORDER — SODIUM CHLORIDE 0.9 % IV BOLUS (SEPSIS)
1000.0000 mL | Freq: Once | INTRAVENOUS | Status: AC
  Administered 2017-09-23: 1000 mL via INTRAVENOUS

## 2017-09-23 MED ORDER — INSULIN DETEMIR 100 UNIT/ML ~~LOC~~ SOLN
8.0000 [IU] | Freq: Every evening | SUBCUTANEOUS | Status: DC | PRN
  Filled 2017-09-23: qty 0.08

## 2017-09-23 MED ORDER — IPRATROPIUM-ALBUTEROL 0.5-2.5 (3) MG/3ML IN SOLN: 3.0000 mL | RESPIRATORY_TRACT | Status: DC | PRN

## 2017-09-23 MED ORDER — ACETAMINOPHEN 325 MG PO TABS
650.0000 mg | ORAL_TABLET | Freq: Four times a day (QID) | ORAL | Status: DC | PRN
Start: 2017-09-23 — End: 2017-09-27
  Administered 2017-09-25: 650 mg via ORAL
  Filled 2017-09-23: qty 2

## 2017-09-23 MED ORDER — SENNOSIDES-DOCUSATE SODIUM 8.6-50 MG PO TABS
1.0000 | ORAL_TABLET | Freq: Every evening | ORAL | Status: DC | PRN
Start: 2017-09-23 — End: 2017-09-27

## 2017-09-23 MED ORDER — ASPIRIN EC 325 MG PO TBEC
325.0000 mg | DELAYED_RELEASE_TABLET | Freq: Every day | ORAL | Status: DC
  Administered 2017-09-25 – 2017-09-27 (×3): 325 mg via ORAL
  Filled 2017-09-23 (×4): qty 1

## 2017-09-23 MED ORDER — LEVETIRACETAM 100 MG/ML PO SOLN
500.0000 mg | Freq: Two times a day (BID) | ORAL | Status: DC
  Filled 2017-09-23 (×2): qty 5

## 2017-09-23 MED ORDER — MAGNESIUM OXIDE 400 (241.3 MG) MG PO TABS
400.0000 mg | ORAL_TABLET | Freq: Two times a day (BID) | ORAL | Status: DC
  Administered 2017-09-25 – 2017-09-27 (×5): 400 mg via ORAL
  Filled 2017-09-23 (×5): qty 1

## 2017-09-23 MED ORDER — PANTOPRAZOLE SODIUM 40 MG PO TBEC
40.0000 mg | DELAYED_RELEASE_TABLET | Freq: Every day | ORAL | Status: DC
  Administered 2017-09-25 – 2017-09-27 (×3): 40 mg via ORAL
  Filled 2017-09-23 (×3): qty 1

## 2017-09-23 MED ORDER — VANCOMYCIN HCL IN DEXTROSE 1-5 GM/200ML-% IV SOLN
1000.0000 mg | Freq: Once | INTRAVENOUS | Status: AC
  Administered 2017-09-23: 1000 mg via INTRAVENOUS
  Filled 2017-09-23: qty 200

## 2017-09-23 MED ORDER — SODIUM CHLORIDE 0.9 % IV SOLN
500.0000 mg | Freq: Once | INTRAVENOUS | Status: AC
  Administered 2017-09-24: 500 mg via INTRAVENOUS
  Filled 2017-09-23: qty 5

## 2017-09-23 MED ORDER — HEPARIN SODIUM (PORCINE) 5000 UNIT/ML IJ SOLN
5000.0000 [IU] | Freq: Three times a day (TID) | INTRAMUSCULAR | Status: DC
  Administered 2017-09-24 – 2017-09-27 (×11): 5000 [IU] via SUBCUTANEOUS
  Filled 2017-09-23 (×11): qty 1

## 2017-09-23 MED ORDER — ONDANSETRON HCL 4 MG PO TABS: 4.0000 mg | ORAL_TABLET | Freq: Four times a day (QID) | ORAL | Status: DC | PRN

## 2017-09-23 MED ORDER — STROKE: EARLY STAGES OF RECOVERY BOOK
Freq: Once | Status: AC
  Administered 2017-09-24: 02:00:00
  Filled 2017-09-23: qty 1

## 2017-09-23 MED ORDER — ONDANSETRON HCL 4 MG/2ML IJ SOLN: 4.0000 mg | Freq: Four times a day (QID) | INTRAMUSCULAR | Status: DC | PRN

## 2017-09-23 MED ORDER — INSULIN ASPART 100 UNIT/ML ~~LOC~~ SOLN
0.0000 [IU] | SUBCUTANEOUS | Status: DC
  Administered 2017-09-24: 3 [IU] via SUBCUTANEOUS
  Administered 2017-09-24: 1 [IU] via SUBCUTANEOUS

## 2017-09-23 MED ORDER — ATORVASTATIN CALCIUM 10 MG PO TABS
10.0000 mg | ORAL_TABLET | Freq: Every day | ORAL | Status: DC
  Administered 2017-09-25 – 2017-09-27 (×3): 10 mg via ORAL
  Filled 2017-09-23 (×3): qty 1

## 2017-09-23 MED ORDER — ACETAMINOPHEN 650 MG RE SUPP: 650.0000 mg | Freq: Four times a day (QID) | RECTAL | Status: DC | PRN

## 2017-09-23 MED ORDER — HYDROCORTISONE NA SUCCINATE PF 100 MG IJ SOLR
100.0000 mg | Freq: Once | INTRAMUSCULAR | Status: AC
  Administered 2017-09-24: 100 mg via INTRAVENOUS
  Filled 2017-09-23: qty 2

## 2017-09-23 MED ORDER — VANCOMYCIN HCL IN DEXTROSE 1-5 GM/200ML-% IV SOLN: 1000.0000 mg | Freq: Once | INTRAVENOUS | Status: DC

## 2017-09-23 MED ORDER — VITAMIN C 500 MG PO TABS
500.0000 mg | ORAL_TABLET | Freq: Every day | ORAL | Status: DC
  Administered 2017-09-25 – 2017-09-27 (×3): 500 mg via ORAL
  Filled 2017-09-23 (×3): qty 1

## 2017-09-23 NOTE — ED Provider Notes (Signed)
[facility] EMERGENCY DEPARTMENT Provider Note   CSN: [id number] Arrival date & time: 09/23/17  1926     History   Chief Complaint Chief Complaint  Patient presents with  . Altered Mental Status  . Respiratory Arrest    HPI [name] [name] is a 76 y.o. female.  The history is provided by the EMS personnel. No language interpreter was used.   [name] [name] is a 76 y.o. female who presents to the Emergency Department complaining of AMS.  Level V caveat due to AMS.  History is provided by EMS.  She was found unresponsive sitting on the commode.  She had agonal respirations for the fire department and she had assisted respirations with bag valve mask ventilation.  She was unresponsive for approximately 10 minutes.  In route to the hospital she did slowly have improved respiratory effort and did begin to regain consciousness.  She does have a history of seizure disorder but no witnessed seizures.  She also has a history of recent hip replacement and recurrent serious bacterial illnesses and sepsis. Past Medical History:  Diagnosis Date  . Anemia   . Arthritis    "a touch in my hands" (11/17/2016)  . Chronic back pain   . Chronic kidney disease (CKD), active medical management without dialysis, stage 3 (moderate) ([place])   . Depression   . GERD (gastroesophageal reflux disease)   . High cholesterol   . Hyperlipidemia   . Hypertension   . Seizures ([place])    last seizure 2014 (11/17/2016)  . Stroke [facility]) 2013   Secondary to acute right posterior temporo-occipital intra-axial hemorrhage  . Type II diabetes mellitus [facility])     Patient Active Problem List   Diagnosis Date Noted  . Fever   . Purulent dermatitis   . Rhonchi   . Palliative care encounter   . Encounter for hospice care discussion   . DNR (do not resuscitate) discussion   . Postoperative wound infection of right hip   . Status epilepticus ([place]) 08/26/2017  . S/P right hip fracture 08/26/2017  . 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 Closed right hip fracture, initial encounter ([place]) 07/27/2017  . Hip fracture ([place]) 07/27/2017  . Pressure injury of skin 06/14/2017  . Encounter for palliative care   . Goals of care, counseling/discussion   . CAP (community acquired pneumonia) 06/12/2017  . Sepsis ([place]) 06/11/2017  . Weakness 06/11/2017  . Anemia 06/11/2017  . AKI (acute kidney injury) ([place]) 06/05/2017  . Sepsis, unspecified organism ([place]) 06/05/2017  . Partial symptomatic epilepsy with complex partial seizures, not intractable, without status epilepticus ([place])   . Bacteremia due to Escherichia coli 11/18/2016  . Renal insufficiency   . Aortic atherosclerosis ([place]) 11/15/2016  . UTI (urinary tract infection) 11/15/2016  . Paranoia ([place]) 05/24/2015  . Tachycardia 07/02/2013  . Hypophosphatemia 07/02/2013  . Encounter for central line placement 07/02/2013  . Hypokalemia 07/01/2013  . Hypomagnesemia 07/01/2013  . Protein-calorie malnutrition, severe ([place]) 06/29/2013  . Leukocytosis, unspecified 06/29/2013  . 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 TIA (transient ischemic attack) 12/13/2012  . 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 Syncope 09/08/2012  . Acute respiratory failure with hypoxia ([place]) 08/20/2012  . Seizure disorder, grand mal ([place]) 08/20/2012  . Seizure ([place]) 08/20/2012  . CKD (chronic kidney disease) stage 3, GFR 30-59 ml/min ([facility]) 08/19/2012  . Leukocytosis 08/19/2012  . Hemorrhage in the brain-13 x 22 x 14 mm right posterior  temporo-occipital intra-axial acute 08/19/2012  . Left hip pain 08/19/2012  . Left hemiparesis ([place]) 08/19/2012  . Hemianopia, homonymous, left 08/19/2012  . Hemisensory deficit, left 08/19/2012  . Nausea 08/19/2012  . Headache(784.0) 08/19/2012  . Acute encephalopathy 08/19/2012  . Hemorrhage of brain, nontraumatic ([place]) 08/18/2012  . Dysphagia 08/18/2012  . HTN (hypertension) 08/18/2012  . DM type 2  (diabetes mellitus, type 2) ([place]) 07/26/2012  . Hypoglycemia due to insulin 07/26/2012  . GERD (gastroesophageal reflux disease)     Past Surgical History:  Procedure Laterality Date  . CATARACT EXTRACTION W/ INTRAOCULAR LENS IMPLANT Bilateral   . COLONOSCOPY    . DILATION AND CURETTAGE OF UTERUS  X 3  . 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 I&D EXTREMITY Right 09/06/2017   Procedure: IRRIGATION AND DEBRIDEMENT HIP;  Surgeon: Mcarthur Rossetti, MD;  Location: [facility];  Service: Orthopedics;  Laterality: Right;  . TOTAL HIP ARTHROPLASTY Right 07/28/2017   Procedure: TOTAL HIP ARTHROPLASTY ANTERIOR APPROACH;  Surgeon: Mcarthur Rossetti, MD;  Location: [facility];  Service: Orthopedics;  Laterality: Right;    OB History    No data available       Home Medications    Prior to Admission medications   Medication Sig Start Date End Date Taking? Authorizing Provider  aspirin EC 325 MG EC tablet Take 1 tablet (325 mg total) by mouth daily with breakfast. 07/29/17  Yes Mcarthur Rossetti, MD  atorvastatin (LIPITOR) 10 MG tablet Take 10 mg by mouth daily.  02/11/13  Yes [provider]  fludrocortisone (FLORINEF) 0.1 MG tablet Take 0.1 mg by mouth daily. AND MONITOR STANDING B/P DAILY 08/25/17  Yes [provider]  LEVEMIR FLEXTOUCH 100 UNIT/ML Pen INJECT 10 UNITS SUBCUTANEOUSLY DAILY AT NIGHT Patient taking differently: Inject 8 Units into the skin at bedtime as needed (as needed for BGL of at least 100 or greater).  06/15/17  Yes Reyne Dumas, MD  levETIRAcetam (KEPPRA) 100 MG/ML solution Take 5 mLs (500 mg total) 2 (two) times daily by mouth. 09/11/17  Yes Kayleen Memos, DO  Magnesium 500 MG TABS Take 1 tablet (500 mg total) by mouth 2 (two) times daily. 06/11/17  Yes Hongalgi, Lenis Dickinson, MD  metoprolol succinate (TOPROL-XL) 25  MG 24 hr tablet Take 1 tablet (25 mg total) by mouth daily. 06/12/17  Yes Hongalgi, Lenis Dickinson, MD  Multiple Vitamin (MULTIVITAMIN) capsule Take 1 capsule by mouth daily.   Yes [provider]  NOVOLOG FLEXPEN 100 UNIT/ML SOPN FlexPen Inject 4-9 Units into the skin See admin instructions. Three times a day with meals per sliding scale: 4 units with breakfast then 4 units with lunch (but increase to 5 units if BGL is >200) and 8 units with supper (but increase to 9 units if BGL is >200) 07/29/13  Yes [provider]  omeprazole (PRILOSEC) 20 MG capsule Take 20 mg by mouth every morning.  03/17/15  Yes [provider]  vitamin C (ASCORBIC ACID) 500 MG tablet Take 500 mg by mouth daily.   Yes [provider]  ZINC OXIDE, TOPICAL, 10 % CREA Apply 1 application topically See admin instructions. TWO TO THREE TIMES A DAY TO IRRITATED AREAS OF BUTTOCKS   Yes [provider]  ondansetron (ZOFRAN ODT) 4 MG disintegrating tablet Take 1 tablet (4 mg total) by mouth  every 8 (eight) hours as needed for nausea or vomiting. Patient not taking: Reported on 09/23/2017 08/10/17   Mcarthur Rossetti, MD    Family History Family History  Problem Relation Age of Onset  . Cancer Mother   . Colon cancer Mother   . Liver disease Mother   . Breast cancer Mother   . Cancer Father   . Diabetes Paternal Grandmother   . Breast cancer Maternal Aunt   . Breast cancer Maternal Aunt   . Esophageal cancer Neg Hx   . Rectal cancer Neg Hx   . Stomach cancer Neg Hx     Social History Social History   Tobacco Use  . Smoking status: Never Smoker  . Smokeless tobacco: Never Used  Substance Use Topics  . Alcohol use: No    Comment: patient drinks caffeinated drinks.   . Drug use: No     Allergies   Barbiturates; Other; and Latex   Review of Systems Review of Systems  Unable to perform ROS: Mental status change     Physical Exam Updated Vital Signs BP 112/65    Pulse 98   Temp (!) 97.5 F (36.4 C) (Rectal)   Resp 13   Ht 5\' 4"  (1.626 m)   Wt 58.1 kg (128 lb)   SpO2 100%   BMI 21.97 kg/m   Physical Exam  Constitutional: She appears well-developed and well-nourished.  HENT:  Head: Normocephalic and atraumatic.  Cardiovascular: Regular rhythm.  No murmur heard. tachycardic  Pulmonary/Chest: Effort normal and breath sounds normal. No respiratory distress.  Abdominal: Soft. There is no tenderness. There is no rebound and no guarding.  Musculoskeletal: She exhibits no edema or tenderness.  Healing surgical incision to the right hip.  Neurological:  Lethargic but arouses to verbal stimuli.  Mumbling speech.  Weakly moves all 4 extremities.  Skin: Skin is warm and dry. There is pallor.  Psychiatric: She has a normal mood and affect. Her behavior is normal.  Nursing note and vitals reviewed.    ED Treatments / Results  Labs (all labs ordered are listed, but only abnormal results are displayed) Labs Reviewed  COMPREHENSIVE METABOLIC PANEL - Abnormal; Notable for the following components:      Result Value   Sodium 133 (*)    Chloride 96 (*)    CO2 20 (*)    Glucose, Bld 360 (*)    BUN 21 (*)    Creatinine, Ser 1.97 (*)    Albumin 2.5 (*)    AST 55 (*)    Alkaline Phosphatase 164 (*)    GFR calc non Af Amer 23 (*)    GFR calc Af Amer 27 (*)    Anion gap 17 (*)    All other components within normal limits  CBC WITH DIFFERENTIAL/PLATELET - Abnormal; Notable for the following components:   RBC 3.82 (*)    MCV 100.5 (*)    RDW 16.1 (*)    All other components within normal limits  URINALYSIS, ROUTINE W REFLEX MICROSCOPIC - Abnormal; Notable for the following components:   Glucose, UA 50 (*)    All other components within normal limits  I-STAT CG4 LACTIC ACID, ED - Abnormal; Notable for the following components:   Lactic Acid, Venous 7.88 (*)    All other components within normal limits  I-STAT ARTERIAL BLOOD GAS, ED - Abnormal;  Notable for the following components:   pO2, Arterial 317.0 (*)    All other components within normal limits  I-STAT CHEM 8, ED - Abnormal; Notable for the following components:   BUN 23 (*)    Creatinine, Ser 1.90 (*)    Glucose, Bld 350 (*)    All other components within normal limits  CULTURE, BLOOD (ROUTINE X 2)  CULTURE, BLOOD (ROUTINE X 2)  URINE CULTURE  PROLACTIN  I-STAT TROPONIN, ED  I-STAT CG4 LACTIC ACID, ED    EKG  EKG Interpretation None       Radiology Ct Head Wo Contrast  Result Date: 09/23/2017 CLINICAL DATA:  Altered level of consciousness. Patient is unresponsive with diagonal respirations. EXAM: CT HEAD WITHOUT CONTRAST TECHNIQUE: Contiguous axial images were obtained from the base of the skull through the vertex without intravenous contrast. COMPARISON:  08/26/2017 FINDINGS: Brain: Diffuse cerebral atrophy. Ventricular dilatation consistent with central atrophy. Low-attenuation changes in the deep white matter consistent with small vessel ischemia. Hazy appearance of the gray-white junctions in the left anterior frontal region could represent early changes of acute infarct. No mass-effect or midline shift. No abnormal extra-axial fluid collections. Basal cisterns are not effaced. No acute intracranial hemorrhage. Vascular: Intracranial arterial calcifications are present. Skull: Calvarium appears intact. Sinuses/Orbits: Paranasal sinuses and mastoid air cells are clear. Other: Nasal cannula. Nodular soft tissue density demonstrated in the subcutaneous fat adjacent to the base of the nose on the left. This is of uncertain etiology. It has been present on prior studies dating back to 08/19/2012, suggesting a benign process. IMPRESSION: 1. Hazy appearance to the gray-white junctions in the left anterior frontal lobe could represent early changes of acute infarct. No mass effect or midline shift. No acute intracranial hemorrhage. 2. Chronic atrophy and small vessel ischemic  changes. 3. Nonspecific nodular soft tissue density in the subcutaneous fat adjacent to the base of the nose on the left. Long-term stability since study dated 08/19/2012 suggests a benign process. Electronically Signed   By: Lucienne Capers M.D.   On: 09/23/2017 21:54   Dg Chest Port 1 View  Result Date: 09/23/2017 CLINICAL DATA:  Altered mental status. EXAM: PORTABLE CHEST 1 VIEW COMPARISON:  09/10/2017 FINDINGS: The cardiac silhouette, mediastinal and hilar contours are normal and stable. There is tortuosity and calcification the thoracic aorta. The lungs are clear. No pleural effusion. Stable biapical pleural and parenchymal scarring changes. The bony thorax is intact. IMPRESSION: No acute cardiopulmonary findings. Electronically Signed   By: Marijo Sanes M.D.   On: 09/23/2017 20:07    Procedures Procedures (including critical care time)  Medications Ordered in ED Medications  vancomycin (VANCOCIN) 500 mg in sodium chloride 0.9 % 100 mL IVPB (not administered)  sodium chloride 0.9 % bolus 1,000 mL (0 mLs Intravenous Stopped 09/23/17 2236)    And  sodium chloride 0.9 % bolus 1,000 mL (1,000 mLs Intravenous New Bag/Given 09/23/17 2205)  piperacillin-tazobactam (ZOSYN) IVPB 3.375 g (0 g Intravenous Stopped 09/23/17 2139)  vancomycin (VANCOCIN) IVPB 1000 mg/200 mL premix (1,000 mg Intravenous New Bag/Given 09/23/17 2140)     Initial Impression / Assessment and Plan / ED Course  I have reviewed the triage vital signs and the nursing notes.  Pertinent labs & imaging results that were available during my care of the patient were reviewed by me and considered in my medical decision making (see chart for details).     Patient with history of sepsis, seizure disorder, deconditioning here from home for evaluation of altered mental status with unresponsiveness.  On ED evaluation she is ill-appearing but responsive to verbal stimuli.  Concern for  possible seizure versus sepsis contributory to  her symptoms.  She was treated with IV fluids and broad-spectrum antibiotics.  Patient's husband states that she would not want intubation.  He is scheduled to talk to palliative care but he has not spoken with them since hospital discharge.  Hospitalist consulted for admission for further treatment.  Final Clinical Impressions(s) / ED Diagnoses   Final diagnoses:  Syncope, unspecified syncope type    ED Discharge Orders    None       Quintella Reichert, MD 09/23/17 2317

## 2017-09-23 NOTE — Telephone Encounter (Signed)
Verbal order given to take sutures out

## 2017-09-23 NOTE — ED Notes (Signed)
Delay in 2nd set blood culture,  Nurse inserting foley cath.

## 2017-09-23 NOTE — ED Triage Notes (Signed)
Pt presents with [facility] from home with being unresponsive with agonal resp and being found in bathroom on toilet by husband; called 911 and EMS found patient to be breathing agonally, rescue breaths given; pt came around and began to follow commands; husband states pt has R hip replacement in Sept 2018 and has felt fine today;  HR 130s, BP 120-130/70s; pt arrives cool/clammy/pale

## 2017-09-23 NOTE — Progress Notes (Signed)
Pharmacy Antibiotic Note  [name] [name] is a 75 y.o. female admitted on 09/23/2017 with sepsisPharmacy has been consulted for one-time Vanc dosing.  Scr 1.9, CrCl 21.8 mL/min, 97.5degF, WBC 9.9, LA 7.88   Plan: Vancomycin 1g IV x1 THEN 500mg  IV Q24H   F/U renal function, clinical status, C&S, vanc trough @SS , LOT   Height: 5\' 4"  (162.6 cm) Weight: 128 lb (58.1 kg) IBW/kg (Calculated) : 54.7  Temp (24hrs), Avg:97.1 F (36.2 C), Min:96.6 F (35.9 C), Max:97.5 F (36.4 C)  Recent Labs  Lab 09/23/17 1946 09/23/17 1947 09/23/17 1950  WBC  --   --  9.9  CREATININE 1.90*  --   --   LATICACIDVEN  --  7.88*  --     Estimated Creatinine Clearance: 21.8 mL/min (A) (by C-G formula based on SCr of 1.9 mg/dL (H)).    Allergies  Allergen Reactions  . Barbiturates     Becomes restless.  Also with all "strong" medications like sleeping pills  . Latex Itching    Antimicrobials this admission: Vanc 11/21 >>   Dose adjustments this admission: Microbiology results: UCx:  BCx:   Thank you for allowing pharmacy to be a part of this patient's care.  Felicity Pellegrini  PharmD Candidate '19 09/07/2017 3:30 PM

## 2017-09-23 NOTE — H&P (Addendum)
History and Physical    [name] [name] VOZ:[id number] DOB: 30-Aug-1941 DOA: 09/23/2017  Referring MD/NP/PA: Dr. Ralene Bathe PCP: [name], MD  Patient coming from: home via EMS  Chief Complaint: Found down at home  I have personally briefly reviewed patient's old medical records in Portal   HPI: [name] [name] is a 76 y.o. female with medical history significant of  anemia, CKD, CVA, depression, GERD, HLD, HTN, complex partial seizure disorder, DM, E. coli bacteremia 06/2017, and right Hip fractures/p repair in 9/24 and subsequent infection requiring I&D on 09/06/2017; who presents after being found unresponsive while sitting on the commode.  Patient's husband provides history this patient was initially altered.  Patient had gotten out of bed to use the restroom and had reportedly passed out on the commode and falling to the floor.  He notes that she was only out for a few seconds.  Husband reports being present was able to help her to get back on the commode.  Thereafter, patient again became unresponsive and was slumped over, but never fell off of the commode.  She never lost a pulse or stopped breathing. He waited 10-15 minutes as he thought symptoms would self resolve as they previously did.  Patient frequently has these episodes where she will pass out especially while using the restroom.  He denies her having any seizure-like activity from what he is witness previously in the past she has had seizures.  He notes that she had associated symptoms labored breathing.  Patient has had gotten out of the hospital after being found to be acutely altered thought to be  ED Course: Upon admission into the emergency department patient was noted temperature of 96.6-97.5 F, pulse 96-110, respirations 13-21, blood pressures 86/47-116/39, and O2 saturations 99-100%.  Initial ABG noted pH of 7.4107, PCO2 41.9, and PO2 317.  Labs revealed WBC 9.9, hemoglobin 12.4, sodium 133, potassium 4.1, chloride 96, CO2 20,  BUN 21, creatinine 1.97, lactic acid 7.88.  CT scan of the brain showed signs of possible early changes of acute infarct of the left anterior frontal lobe.  Due to patient's symptoms blood cultures were obtained and sepsis protocol was initiated with fluid bolus of 2 L and empiric antibiotics of Vanco and Zosyn.  Review of Systems  Unable to perform ROS: Mental status change    Past Medical History:  Diagnosis Date  . Anemia   . Arthritis    "a touch in my hands" (11/17/2016)  . Chronic back pain   . Chronic kidney disease (CKD), active medical management without dialysis, stage 3 (moderate) ([place])   . Depression   . GERD (gastroesophageal reflux disease)   . High cholesterol   . Hyperlipidemia   . Hypertension   . Seizures ([place])    last seizure 2014 (11/17/2016)  . Stroke [facility]) 2013   Secondary to acute right posterior temporo-occipital intra-axial hemorrhage  . Type II diabetes mellitus ([place])     Past Surgical History:  Procedure Laterality Date  . CATARACT EXTRACTION W/ INTRAOCULAR LENS IMPLANT Bilateral   . COLONOSCOPY    . DILATION AND CURETTAGE OF UTERUS  X 3  . 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 I&D EXTREMITY Right 09/06/2017   Procedure: IRRIGATION AND DEBRIDEMENT HIP;  Surgeon: Mcarthur Rossetti, MD;  Location: [place];  Service: Orthopedics;  Laterality: Right;  .  TOTAL HIP ARTHROPLASTY Right 07/28/2017   Procedure: TOTAL HIP ARTHROPLASTY ANTERIOR APPROACH;  Surgeon: Mcarthur Rossetti, MD;  Location: [name];  Service: Orthopedics;  Laterality: Right;     reports that  has never smoked. she has never used smokeless tobacco. She reports that she does not drink alcohol or use drugs.  Allergies  Allergen Reactions  . Barbiturates Other (See Comments)    Restlessness  . Other Other (See Comments)    "Strong meds" ("like  sleeping pills") = Restlessness  . Latex Itching    Family History  Problem Relation Age of Onset  . Cancer Mother   . Colon cancer Mother   . Liver disease Mother   . Breast cancer Mother   . Cancer Father   . Diabetes Paternal Grandmother   . Breast cancer Maternal Aunt   . Breast cancer Maternal Aunt   . Esophageal cancer Neg Hx   . Rectal cancer Neg Hx   . Stomach cancer Neg Hx     Prior to Admission medications   Medication Sig Start Date End Date Taking? Authorizing Provider  aspirin EC 325 MG EC tablet Take 1 tablet (325 mg total) by mouth daily with breakfast. 07/29/17  Yes Mcarthur Rossetti, MD  atorvastatin (LIPITOR) 10 MG tablet Take 10 mg by mouth daily.  02/11/13  Yes [provider]  fludrocortisone (FLORINEF) 0.1 MG tablet Take 0.1 mg by mouth daily. AND MONITOR STANDING B/P DAILY 08/25/17  Yes [provider]  LEVEMIR FLEXTOUCH 100 UNIT/ML Pen INJECT 10 UNITS SUBCUTANEOUSLY DAILY AT NIGHT Patient taking differently: Inject 8 Units into the skin at bedtime as needed (as needed for BGL of at least 100 or greater).  06/15/17  Yes Reyne Dumas, MD  levETIRAcetam (KEPPRA) 100 MG/ML solution Take 5 mLs (500 mg total) 2 (two) times daily by mouth. 09/11/17  Yes Kayleen Memos, DO  Magnesium 500 MG TABS Take 1 tablet (500 mg total) by mouth 2 (two) times daily. 06/11/17  Yes Hongalgi, Lenis Dickinson, MD  metoprolol succinate (TOPROL-XL) 25 MG 24 hr tablet Take 1 tablet (25 mg total) by mouth daily. 06/12/17  Yes Hongalgi, Lenis Dickinson, MD  Multiple Vitamin (MULTIVITAMIN) capsule Take 1 capsule by mouth daily.   Yes [provider]  NOVOLOG FLEXPEN 100 UNIT/ML SOPN FlexPen Inject 4-9 Units into the skin See admin instructions. Three times a day with meals per sliding scale: 4 units with breakfast then 4 units with lunch (but increase to 5 units if BGL is >200) and 8 units with supper (but increase to 9 units if BGL is >200) 07/29/13  Yes [provider]    omeprazole (PRILOSEC) 20 MG capsule Take 20 mg by mouth every morning.  03/17/15  Yes [provider]  vitamin C (ASCORBIC ACID) 500 MG tablet Take 500 mg by mouth daily.   Yes [provider]  ZINC OXIDE, TOPICAL, 10 % CREA Apply 1 application topically See admin instructions. TWO TO THREE TIMES A DAY TO IRRITATED AREAS OF BUTTOCKS   Yes [provider]  ondansetron (ZOFRAN ODT) 4 MG disintegrating tablet Take 1 tablet (4 mg total) by mouth every 8 (eight) hours as needed for nausea or vomiting. Patient not taking: Reported on 09/23/2017 08/10/17   Mcarthur Rossetti, MD    Physical Exam:  Constitutional:  Chronic ill-appearing female who is is now more responsive. Vitals:   09/23/17 2100 09/23/17 2115 09/23/17 2148 09/23/17 2200  BP: (!) 114/51 (!) 116/39  108/64 112/65  Pulse: 96 98 (!) 102 98  Resp: 13 14 (!) 21 13  Temp:      TempSrc:      SpO2: 100% 100% 99% 100%  Weight:      Height:       Eyes: PERRL, lids and conjunctivae normal ENMT: Mucous membranes are dry.  Posterior pharynx clear of any exudate or lesions.  Patient has tongue bite marks present and is hard of hearing. Neck: normal, supple, no masses, no thyromegaly Respiratory: clear to auscultation bilaterally, no wheezing, no crackles. Normal respiratory effort. No accessory muscle use.  Cardiovascular: Regular rate and rhythm, no murmurs / rubs / gallops. No extremity edema. 2+ pedal pulses. No carotid bruits.  Abdomen: no tenderness, no masses palpated. No hepatosplenomegaly. Bowel sounds positive.  Musculoskeletal: no clubbing / cyanosis. No joint deformity upper and lower extremities. Good ROM, no contractures. Normal muscle tone.  Skin: Pale, stitches removed from right hip no clear signs of infection present. Neurologic: CN 2-12 grossly intact. Sensation intact, DTR normal. Strength 4/5 in all 4.  Psychiatric: lethargic , but arousable.   Labs on Admission: I have personally  reviewed following labs and imaging studies  CBC: Recent Labs  Lab 09/23/17 1946 09/23/17 1950  WBC  --  9.9  NEUTROABS  --  6.8  HGB 14.6 12.2  HCT 43.0 38.4  MCV  --  100.5*  PLT  --  626   Basic Metabolic Panel: Recent Labs  Lab 09/23/17 1946 09/23/17 1950  NA 137 133*  K 4.2 4.1  CL 101 96*  CO2  --  20*  GLUCOSE 350* 360*  BUN 23* 21*  CREATININE 1.90* 1.97*  CALCIUM  --  9.2   GFR: Estimated Creatinine Clearance: 21 mL/min (A) (by C-G formula based on SCr of 1.97 mg/dL (H)). Liver Function Tests: Recent Labs  Lab 09/23/17 1950  AST 55*  ALT 22  ALKPHOS 164*  BILITOT 0.6  PROT 6.9  ALBUMIN 2.5*   No results for input(s): LIPASE, AMYLASE in the last 168 hours. No results for input(s): AMMONIA in the last 168 hours. Coagulation Profile: No results for input(s): INR, PROTIME in the last 168 hours. Cardiac Enzymes: No results for input(s): CKTOTAL, CKMB, CKMBINDEX, TROPONINI in the last 168 hours. BNP (last 3 results) No results for input(s): PROBNP in the last 8760 hours. HbA1C: No results for input(s): HGBA1C in the last 72 hours. CBG: No results for input(s): GLUCAP in the last 168 hours. Lipid Profile: No results for input(s): CHOL, HDL, LDLCALC, TRIG, CHOLHDL, LDLDIRECT in the last 72 hours. Thyroid Function Tests: No results for input(s): TSH, T4TOTAL, FREET4, T3FREE, THYROIDAB in the last 72 hours. Anemia Panel: No results for input(s): VITAMINB12, FOLATE, FERRITIN, TIBC, IRON, RETICCTPCT in the last 72 hours. Urine analysis:    Component Value Date/Time   COLORURINE YELLOW 09/23/2017 2009   APPEARANCEUR CLEAR 09/23/2017 2009   LABSPEC 1.014 09/23/2017 2009   PHURINE 5.0 09/23/2017 2009   GLUCOSEU 50 (A) 09/23/2017 2009   HGBUR NEGATIVE 09/23/2017 2009   [place] NEGATIVE 09/23/2017 2009   BILIRUBINUR neg 08/16/2012 1531   KETONESUR NEGATIVE 09/23/2017 2009   PROTEINUR NEGATIVE 09/23/2017 2009   UROBILINOGEN 0.2 06/25/2013 1002    NITRITE NEGATIVE 09/23/2017 2009   LEUKOCYTESUR NEGATIVE 09/23/2017 2009   Sepsis Labs: No results found for this or any previous visit (from the past 240 hour(s)).   Radiological Exams on Admission: Ct Head Wo Contrast  Result Date: 09/23/2017  CLINICAL DATA:  Altered level of consciousness. Patient is unresponsive with diagonal respirations. EXAM: CT HEAD WITHOUT CONTRAST TECHNIQUE: Contiguous axial images were obtained from the base of the skull through the vertex without intravenous contrast. COMPARISON:  08/26/2017 FINDINGS: Brain: Diffuse cerebral atrophy. Ventricular dilatation consistent with central atrophy. Low-attenuation changes in the deep white matter consistent with small vessel ischemia. Hazy appearance of the gray-white junctions in the left anterior frontal region could represent early changes of acute infarct. No mass-effect or midline shift. No abnormal extra-axial fluid collections. Basal cisterns are not effaced. No acute intracranial hemorrhage. Vascular: Intracranial arterial calcifications are present. Skull: Calvarium appears intact. Sinuses/Orbits: Paranasal sinuses and mastoid air cells are clear. Other: Nasal cannula. Nodular soft tissue density demonstrated in the subcutaneous fat adjacent to the base of the nose on the left. This is of uncertain etiology. It has been present on prior studies dating back to 08/19/2012, suggesting a benign process. IMPRESSION: 1. Hazy appearance to the gray-white junctions in the left anterior frontal lobe could represent early changes of acute infarct. No mass effect or midline shift. No acute intracranial hemorrhage. 2. Chronic atrophy and small vessel ischemic changes. 3. Nonspecific nodular soft tissue density in the subcutaneous fat adjacent to the base of the nose on the left. Long-term stability since study dated 08/19/2012 suggests a benign process. Electronically Signed   By: Lucienne Capers M.D.   On: 09/23/2017 21:54   Dg Chest  Port 1 View  Result Date: 09/23/2017 CLINICAL DATA:  Altered mental status. EXAM: PORTABLE CHEST 1 VIEW COMPARISON:  09/10/2017 FINDINGS: The cardiac silhouette, mediastinal and hilar contours are normal and stable. There is tortuosity and calcification the thoracic aorta. The lungs are clear. No pleural effusion. Stable biapical pleural and parenchymal scarring changes. The bony thorax is intact. IMPRESSION: No acute cardiopulmonary findings. Electronically Signed   By: Marijo Sanes M.D.   On: 09/23/2017 20:07    EKG: Independently reviewed.  Appears to be sinus tachycardia vs.afib at 103 beats per minute  Assessment/Plan Acute encephalopathy with Loss of consciousness: Resolving.  Patient was initially found unresponsive on her toilet seat.  Family reports no seizure-like activities.  However, patient with agonal breathing and on physical exam seem to have tongue biting.  Question possibility of seizure vs syncope. - Admit to stepdown bed  Possible CVA: Patient's initial CT of the brain showed possible acute frontal lobe infarct.  Neurology was notified, but recommended re-paging if acute stroke found. -Stroke order set initiated - Neurochecks  - Check MRI of brain w/o  - Follow-up echocardiogram which also evaluate for the possibility of syncope  Lactic acidosis/ SIRS - Follow-up blood cultures - Trend lactic acid levels - Continue empiric antibiotics of Vanco and Zosyn for now, and de-escalate when medically appropriate  Acute kidney injury on chronic kidney disease stage III: Patient presents with a creatinine of 1.97 with BUN 21. Baseline creatinine appears around 1-1.3. - IV fluids of normal saline at 75 mL/h overnight - Recheck BMP in a.m.  Hypotension, H/O adrenal insufficiency: Acute.  Patient noted to have initial blood pressures as low as 86/47 on admission which seemed to improve after initial IV fluid boluses. - Give 100 mg a hydrocortisone IV  Diabetes mellitus type 2:  Patient's initial blood sugar was noted to be elevated at 360 on admission with elevated anion gap of 17.  Urinalysis was negative for ketones.  Last hemoglobin A1c noted to be 6.5 on 08/26/2017. - Hypoglycemic protocols - Continue patient  home Lantus dose - CBGs every 4 hours overnight with sensitive sliding scale insulin  Seizure disorder: Patient with previous history of seizure disorder, but husband reports symptoms at night did not necessarily appear like previous seizure C seen. - Monitor for seizure-like activity - Add on prolactin level onto initial labs - Keppra IV overnight if n.p.o.  Chronic diastolic congestive heart failure - I&O's and daily weights -  held metoprolol secondary to hypotension  Coronary artery disease - Continue aspirin and Lipitor when able  Dysphasia:Patient previously was on a pred thin liquids  DVT prophylaxis: heaarin  Code Status:DNI Family Communication: Discussed plan of care with the patient and family present Disposition Plan: TBD Consults called: none  Admission status:observation  Norval Morton MD [facility] Pager [phone]   If 7PM-7AM, please contact night-coverage [email or web address] Password [facility]  09/23/2017, 10:35 PM

## 2017-09-23 NOTE — ED Notes (Signed)
ED Provider at bedside. 

## 2017-09-23 NOTE — ED Notes (Signed)
IV team at bedside 

## 2017-09-23 NOTE — ED Notes (Signed)
Patient transported to CT on baby monitor.

## 2017-09-24 ENCOUNTER — Observation Stay (HOSPITAL_BASED_OUTPATIENT_CLINIC_OR_DEPARTMENT_OTHER): Payer: Medicare Other

## 2017-09-24 ENCOUNTER — Observation Stay (HOSPITAL_COMMUNITY): Payer: Medicare Other

## 2017-09-24 DIAGNOSIS — L080 Pyoderma: Secondary | ICD-10-CM | POA: Diagnosis present

## 2017-09-24 DIAGNOSIS — I7389 Other specified peripheral vascular diseases: Secondary | ICD-10-CM | POA: Diagnosis present

## 2017-09-24 DIAGNOSIS — K219 Gastro-esophageal reflux disease without esophagitis: Secondary | ICD-10-CM | POA: Diagnosis present

## 2017-09-24 DIAGNOSIS — E876 Hypokalemia: Secondary | ICD-10-CM | POA: Diagnosis present

## 2017-09-24 DIAGNOSIS — I130 Hypertensive heart and chronic kidney disease with heart failure and stage 1 through stage 4 chronic kidney disease, or unspecified chronic kidney disease: Secondary | ICD-10-CM | POA: Diagnosis present

## 2017-09-24 DIAGNOSIS — N183 Chronic kidney disease, stage 3 (moderate): Secondary | ICD-10-CM | POA: Diagnosis present

## 2017-09-24 DIAGNOSIS — E1122 Type 2 diabetes mellitus with diabetic chronic kidney disease: Secondary | ICD-10-CM | POA: Diagnosis present

## 2017-09-24 DIAGNOSIS — Z8673 Personal history of transient ischemic attack (TIA), and cerebral infarction without residual deficits: Secondary | ICD-10-CM | POA: Diagnosis not present

## 2017-09-24 DIAGNOSIS — G9340 Encephalopathy, unspecified: Secondary | ICD-10-CM | POA: Diagnosis not present

## 2017-09-24 DIAGNOSIS — I700 Atherosclerosis of aorta: Secondary | ICD-10-CM | POA: Diagnosis present

## 2017-09-24 DIAGNOSIS — I959 Hypotension, unspecified: Secondary | ICD-10-CM | POA: Diagnosis present

## 2017-09-24 DIAGNOSIS — I5032 Chronic diastolic (congestive) heart failure: Secondary | ICD-10-CM | POA: Diagnosis present

## 2017-09-24 DIAGNOSIS — I2510 Atherosclerotic heart disease of native coronary artery without angina pectoris: Secondary | ICD-10-CM | POA: Diagnosis present

## 2017-09-24 DIAGNOSIS — E2740 Unspecified adrenocortical insufficiency: Secondary | ICD-10-CM | POA: Diagnosis present

## 2017-09-24 DIAGNOSIS — E860 Dehydration: Secondary | ICD-10-CM | POA: Diagnosis present

## 2017-09-24 DIAGNOSIS — F418 Other specified anxiety disorders: Secondary | ICD-10-CM | POA: Diagnosis present

## 2017-09-24 DIAGNOSIS — Z794 Long term (current) use of insulin: Secondary | ICD-10-CM | POA: Diagnosis not present

## 2017-09-24 DIAGNOSIS — N179 Acute kidney failure, unspecified: Secondary | ICD-10-CM | POA: Diagnosis present

## 2017-09-24 DIAGNOSIS — Z7982 Long term (current) use of aspirin: Secondary | ICD-10-CM | POA: Diagnosis not present

## 2017-09-24 DIAGNOSIS — G40409 Other generalized epilepsy and epileptic syndromes, not intractable, without status epilepticus: Secondary | ICD-10-CM | POA: Diagnosis present

## 2017-09-24 DIAGNOSIS — R4702 Dysphasia: Secondary | ICD-10-CM | POA: Diagnosis present

## 2017-09-24 DIAGNOSIS — E872 Acidosis: Secondary | ICD-10-CM | POA: Diagnosis present

## 2017-09-24 DIAGNOSIS — G9341 Metabolic encephalopathy: Secondary | ICD-10-CM | POA: Diagnosis present

## 2017-09-24 DIAGNOSIS — R092 Respiratory arrest: Secondary | ICD-10-CM | POA: Diagnosis present

## 2017-09-24 DIAGNOSIS — R6510 Systemic inflammatory response syndrome (SIRS) of non-infectious origin without acute organ dysfunction: Secondary | ICD-10-CM | POA: Diagnosis present

## 2017-09-24 DIAGNOSIS — R55 Syncope and collapse: Secondary | ICD-10-CM | POA: Diagnosis present

## 2017-09-24 LAB — LIPID PANEL
CHOL/HDL RATIO: 3.4 ratio
Cholesterol: 108 mg/dL (ref 0–200)
HDL: 32 mg/dL — ABNORMAL LOW (ref >40)
LDL Cholesterol: 60 mg/dL (ref 0–99)
Triglycerides: 81 mg/dL (ref <150)
VLDL: 16 mg/dL (ref 0–40)

## 2017-09-24 LAB — GLUCOSE, CAPILLARY
GLUCOSE-CAPILLARY: 248 mg/dL — AB (ref 65–99)
GLUCOSE-CAPILLARY: 121 mg/dL — AB (ref 65–99)
Glucose-Capillary: 147 mg/dL — ABNORMAL HIGH (ref 65–99)
Glucose-Capillary: 216 mg/dL — ABNORMAL HIGH (ref 65–99)

## 2017-09-24 LAB — AMMONIA: Ammonia: 19 umol/L (ref 9–35)

## 2017-09-24 LAB — MRSA PCR SCREENING: MRSA by PCR: NEGATIVE

## 2017-09-24 LAB — MAGNESIUM: Magnesium: 1.5 mg/dL — ABNORMAL LOW (ref 1.7–2.4)

## 2017-09-24 LAB — ECHOCARDIOGRAM COMPLETE
HEIGHTINCHES: 64 in
WEIGHTICAEL: 1996.49 [oz_av]

## 2017-09-24 LAB — HEMOGLOBIN A1C
HEMOGLOBIN A1C: 7 % — AB (ref 4.8–5.6)
MEAN PLASMA GLUCOSE: 154.2 mg/dL

## 2017-09-24 MED ORDER — LEVETIRACETAM 500 MG/5ML IV SOLN
500.0000 mg | Freq: Two times a day (BID) | INTRAVENOUS | Status: DC
Start: 2017-09-24 — End: 2017-09-27
  Administered 2017-09-24 – 2017-09-27 (×6): 500 mg via INTRAVENOUS
  Filled 2017-09-24 (×6): qty 5

## 2017-09-24 MED ORDER — CHLORHEXIDINE GLUCONATE CLOTH 2 % EX PADS: 6.0000 | MEDICATED_PAD | Freq: Every day | CUTANEOUS | Status: DC

## 2017-09-24 MED ORDER — INSULIN ASPART 100 UNIT/ML ~~LOC~~ SOLN
0.0000 [IU] | Freq: Four times a day (QID) | SUBCUTANEOUS | Status: DC
  Administered 2017-09-24: 3 [IU] via SUBCUTANEOUS
  Administered 2017-09-25 (×3): 1 [IU] via SUBCUTANEOUS
  Administered 2017-09-26: 2 [IU] via SUBCUTANEOUS
  Administered 2017-09-26 (×2): 1 [IU] via SUBCUTANEOUS
  Administered 2017-09-26 – 2017-09-27 (×3): 2 [IU] via SUBCUTANEOUS

## 2017-09-24 MED ORDER — ORAL CARE MOUTH RINSE
15.0000 mL | Freq: Two times a day (BID) | OROMUCOSAL | Status: DC
  Administered 2017-09-24 – 2017-09-27 (×6): 15 mL via OROMUCOSAL

## 2017-09-24 MED ORDER — MUPIROCIN 2 % EX OINT
1.0000 "application " | TOPICAL_OINTMENT | Freq: Two times a day (BID) | CUTANEOUS | Status: DC
  Administered 2017-09-24 (×2): 1 via NASAL
  Filled 2017-09-24: qty 22

## 2017-09-24 MED ORDER — MAGNESIUM SULFATE 2 GM/50ML IV SOLN
2.0000 g | Freq: Once | INTRAVENOUS | Status: AC
  Administered 2017-09-24: 2 g via INTRAVENOUS
  Filled 2017-09-24: qty 50

## 2017-09-24 MED ORDER — PIPERACILLIN-TAZOBACTAM 3.375 G IVPB 30 MIN
3.3750 g | Freq: Three times a day (TID) | INTRAVENOUS | Status: DC
  Administered 2017-09-24 – 2017-09-25 (×4): 3.375 g via INTRAVENOUS
  Filled 2017-09-24 (×5): qty 50

## 2017-09-24 NOTE — Progress Notes (Signed)
Patient seen and admitted earlier this am by my associate. Had elevated lactic acidosis and found down while on the commode. MRI is negative for acute stroke.  Continue plan as outlined on H and P. Will place order for speech therapy evaluation.  Pt appears dehydrated to me. Will increase fluid rate to 125 cc/hr.  Will reassess next am.  Velvet Bathe

## 2017-09-24 NOTE — Progress Notes (Signed)
  Echocardiogram 2D Echocardiogram has been performed.  Darlina Sicilian M 09/24/2017, 2:04 PM

## 2017-09-24 NOTE — Progress Notes (Signed)
Pharmacy Antibiotic Note  Hiedi Touchton is a 76 y.o. female admitted on 09/23/2017 with sepsis.  Pharmacy has been consulted for Zosyn dosing. Already on vancomycin. Noted renal dysfunction  Plan: Zosyn 3.375G IV q8h to be infused over 4 hours   Height: 5\' 4"  (162.6 cm) Weight: 124 lb 12.5 oz (56.6 kg) IBW/kg (Calculated) : 54.7  Temp (24hrs), Avg:97.8 F (36.6 C), Min:96.6 F (35.9 C), Max:99 F (37.2 C)  Recent Labs  Lab 09/23/17 1946 09/23/17 1947 09/23/17 1950 09/23/17 2203  WBC  --   --  9.9  --   CREATININE 1.90*  --  1.97*  --   LATICACIDVEN  --  7.88*  --  1.42    Estimated Creatinine Clearance: 21 mL/min (A) (by C-G formula based on SCr of 1.97 mg/dL (H)).    Allergies  Allergen Reactions  . Barbiturates Other (See Comments)    Restlessness  . Other Other (See Comments)    "Strong meds" ("like sleeping pills") = Restlessness  . Latex Itching     [name] 09/24/2017 6:14 AM

## 2017-09-24 NOTE — ED Notes (Signed)
Need gold top tube when pt returns from MRI

## 2017-09-24 NOTE — Evaluation (Signed)
Clinical/Bedside Swallow Evaluation Patient Details  Name: [name] [name] MRN: [id number] Date of Birth: 05/15/1941  Today's Date: 09/24/2017 Time: SLP Start Time (ACUTE ONLY): 7169 SLP Stop Time (ACUTE ONLY): 0940 SLP Time Calculation (min) (ACUTE ONLY): 12 min  Past Medical History:  Past Medical History:  Diagnosis Date  . Anemia   . Arthritis    "a touch in my hands" (11/17/2016)  . Chronic back pain   . Chronic kidney disease (CKD), active medical management without dialysis, stage 3 (moderate) ([place])   . Depression   . GERD (gastroesophageal reflux disease)   . High cholesterol   . Hyperlipidemia   . Hypertension   . Seizures ([place])    last seizure 2014 (11/17/2016)  . Stroke [facility]) 2013   Secondary to acute right posterior temporo-occipital intra-axial hemorrhage  . Type II diabetes mellitus ([place])    Past Surgical History:  Past Surgical History:  Procedure Laterality Date  . CATARACT EXTRACTION W/ INTRAOCULAR LENS IMPLANT Bilateral   . COLONOSCOPY    . DILATION AND CURETTAGE OF UTERUS  X 3  . 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 I&D EXTREMITY Right 09/06/2017   Procedure: IRRIGATION AND DEBRIDEMENT HIP;  Surgeon: Mcarthur Rossetti, MD;  Location: [place];  Service: Orthopedics;  Laterality: Right;  . TOTAL HIP ARTHROPLASTY Right 07/28/2017   Procedure: TOTAL HIP ARTHROPLASTY ANTERIOR APPROACH;  Surgeon: Mcarthur Rossetti, MD;  Location: [place];  Service: Orthopedics;  Laterality: Right;   HPI:  Pt is a 76 y.o.femaleadmitted after syncopal event with concern for seizure and possible CVA. MRI negative for acute infarct. Pt has been seen by SLP previously as recently as earlier this month, at which time the pt's husband, in discussion with palliative care, wanted her to have comfort feeds. SLP suggested pureed foods and thin  liquids given pt's cognitively-based dysphagia. PMH also includes: anemia, CKD,CVA,depression, GERD, HLD, HTN,complex partial seizuredisorder, DM,E. coli bacteremia8/2018, and rightHip fractures/p repair in 9/24and subsequent infection requiring I&D on 09/06/2017.   Assessment / Plan / Recommendation Clinical Impression  Pt has a cognitively-based dysphagia with poor awareness of boluses regardless of texture or presentation method. She has pooling in her anterior sulcus with minimal attempts to posteriorly move any POs. She did swallow to command x1, but the applesauce remained in her anterior oral cavity. Ultimately, all POs were suctioned back out of her mouth. She remains at a high risk for aspiration given her mentation. Previously her husband wanted her to be able to have comfort feeds. If he feels the same way, I would suggest resuming a pureed diet and thin liquids with careful hand feeding and use of suction PRN if she is orally holding boluses. Until his wishes are clear, will defer diet recommendation.  SLP Visit Diagnosis: Dysphagia, oral phase (R13.11)    Aspiration Risk  Severe aspiration risk;Risk for inadequate nutrition/hydration    Diet Recommendation NPO;Other (Comment)(unless pt's husband wants comfort feeds again)   Medication Administration: Via alternative means    Other  Recommendations Oral Care Recommendations: Oral care QID Other Recommendations: Have oral suction available   Follow up Recommendations 24 hour supervision/assistance      Frequency and Duration min 2x/week  2 weeks       Prognosis Prognosis for Safe Diet Advancement: Good Barriers to Reach Goals: Cognitive deficits  Swallow Study   General HPI: Pt is a 76 y.o.femaleadmitted after syncopal event with concern for seizure and possible CVA. MRI negative for acute infarct. Pt has been seen by SLP previously as recently as earlier this month, at which time the pt's husband, in discussion  with palliative care, wanted her to have comfort feeds. SLP suggested pureed foods and thin liquids given pt's cognitively-based dysphagia. PMH also includes: anemia, CKD,CVA,depression, GERD, HLD, HTN,complex partial seizuredisorder, DM,E. coli bacteremia8/2018, and rightHip fractures/p repair in 9/24and subsequent infection requiring I&D on 09/06/2017. Type of Study: Bedside Swallow Evaluation Previous Swallow Assessment: see HPI Diet Prior to this Study: NPO Temperature Spikes Noted: No Respiratory Status: Nasal cannula History of Recent Intubation: No Behavior/Cognition: Lethargic/Drowsy;Requires cueing Oral Cavity Assessment: Within Functional Limits Oral Care Completed by SLP: No Oral Cavity - Dentition: Adequate natural dentition Self-Feeding Abilities: Needs assist Patient Positioning: Upright in bed Baseline Vocal Quality: Not observed Volitional Swallow: (difficulty eliciting - not consistent)    Oral/Motor/Sensory Function Overall Oral Motor/Sensory Function: (difficulty following commands to assess)   Ice Chips Ice chips: Impaired Presentation: Spoon Oral Phase Impairments: Poor awareness of bolus;Other (comment)(pooling in anterior sulcus) Pharyngeal Phase Impairments: Unable to trigger swallow   Thin Liquid Thin Liquid: Impaired Presentation: Cup;Straw Oral Phase Impairments: Poor awareness of bolus;Other (comment)(no attempts to obtain liquid)    Nectar Thick Nectar Thick Liquid: Not tested   Honey Thick Honey Thick Liquid: Not tested   Puree Puree: Impaired Presentation: Spoon Oral Phase Impairments: Poor awareness of bolus;Other (comment)(pooling in anterior sulcus) Pharyngeal Phase Impairments: Suspected delayed Swallow;Cough - Delayed   Solid   GO   Solid: Not tested        [name] 09/24/2017,11:48 AM  [name], M.A. CCC-SLP [phone]

## 2017-09-24 NOTE — ED Notes (Signed)
Patient transported to MRI 

## 2017-09-25 LAB — GLUCOSE, CAPILLARY
GLUCOSE-CAPILLARY: 113 mg/dL — AB (ref 65–99)
GLUCOSE-CAPILLARY: 131 mg/dL — AB (ref 65–99)
Glucose-Capillary: 124 mg/dL — ABNORMAL HIGH (ref 65–99)
Glucose-Capillary: 132 mg/dL — ABNORMAL HIGH (ref 65–99)
Glucose-Capillary: 99 mg/dL (ref 65–99)

## 2017-09-25 LAB — BASIC METABOLIC PANEL
Anion gap: 8 (ref 5–15)
BUN: 11 mg/dL (ref 6–20)
CALCIUM: 8.3 mg/dL — AB (ref 8.9–10.3)
CO2: 24 mmol/L (ref 22–32)
Chloride: 111 mmol/L (ref 101–111)
Creatinine, Ser: 1.42 mg/dL — ABNORMAL HIGH (ref 0.44–1.00)
GFR calc Af Amer: 40 mL/min — ABNORMAL LOW (ref >60)
GFR, EST NON AFRICAN AMERICAN: 35 mL/min — AB (ref >60)
GLUCOSE: 123 mg/dL — AB (ref 65–99)
Potassium: 2.6 mmol/L — CL (ref 3.5–5.1)
SODIUM: 143 mmol/L (ref 135–145)

## 2017-09-25 LAB — URINE CULTURE: Culture: NO GROWTH

## 2017-09-25 MED ORDER — MAGNESIUM SULFATE 2 GM/50ML IV SOLN
2.0000 g | Freq: Once | INTRAVENOUS | Status: AC
  Administered 2017-09-25: 2 g via INTRAVENOUS
  Filled 2017-09-25: qty 50

## 2017-09-25 MED ORDER — POTASSIUM CHLORIDE IN NACL 40-0.9 MEQ/L-% IV SOLN
INTRAVENOUS | Status: AC
  Administered 2017-09-25: 125 mL/h via INTRAVENOUS
  Filled 2017-09-25: qty 1000

## 2017-09-25 MED ORDER — POTASSIUM CHLORIDE CRYS ER 20 MEQ PO TBCR
60.0000 meq | EXTENDED_RELEASE_TABLET | Freq: Once | ORAL | Status: AC
  Administered 2017-09-25: 60 meq via ORAL
  Filled 2017-09-25: qty 3

## 2017-09-25 MED ORDER — LEVOFLOXACIN 250 MG PO TABS
250.0000 mg | ORAL_TABLET | Freq: Every day | ORAL | Status: DC
  Administered 2017-09-25 – 2017-09-26 (×2): 250 mg via ORAL
  Filled 2017-09-25 (×3): qty 1

## 2017-09-25 NOTE — Progress Notes (Addendum)
CRITICAL VALUE ALERT  Critical Value:  Potassium 2.6  Date & Time Notied: 09/25/17 0437  Provider Notified:On call OPYD   Orders Received/Actions taken: Orders received  To d/c NS @125  and start NS w/ 20K @ 125, Mag 2g. Will continue to monitor.

## 2017-09-25 NOTE — Progress Notes (Signed)
PROGRESS NOTE    [name] [name]  HFW:[id number] DOB: 11-Feb-1941 DOA: 09/23/2017 PCP: [name], MD    Brief Narrative:  75 y.o. female with medical history significant of  anemia, CKD, CVA, depression, GERD, HLD, HTN, complex partial seizure disorder, DM, E. coli bacteremia 06/2017, and right Hip fractures/p repair in 9/24 and subsequent infection requiring I&D on 09/06/2017; who presents after being found unresponsive while sitting on the commode.    Assessment & Plan: SIRs -  Was on broad spectrum antibiotics will narrow to levaquin as patient most at risk for aspiration pna although chest x ray was negative.  Principal Problem:   Acute encephalopathy - Most likely due to decreased intravascular volume from dehydration - Pt is more alert today - MRI negative for acute stroke - May have been breakthrough seizure  Hypokalemia/hypomagnesemia  - K levels down, replaced magnesium - agree with current potassium replacement regimen. Pt has 40 meq in IVF's.  - Going to get Kdur 25meq  Active Problems:   GERD (gastroesophageal reflux disease) - Pt has PPI    DM type 2 (diabetes mellitus, type 2) (HCC) -  Pt has SSI    Seizure disorder, grand mal ([place]) - Pt is on Keppra    Loss of consciousness ([place])   Acute kidney injury superimposed on chronic kidney disease ([place]) - improving and most likely due to poor oral intake (prerenal etiology)    Hypotension - resolved    Chronic diastolic CHF (congestive heart failure) ([place]) - currently stable   DVT prophylaxis: Heparin Code Status: Partial  Family Communication: None at bedside. Disposition Plan: Will d/c most likely next am after K levels are corrected.   Consultants:   none   Procedures: none   Antimicrobials: Pip/tazo, vancomycin>>>levaquin   Subjective: Pt has no new complaints, no acute issues overnight.  Objective: Vitals:   09/25/17 0032 09/25/17 0415 09/25/17 0545 09/25/17 0751  BP:  123/69  129/60    Pulse:  (!) 101    Resp:  16    Temp: 98.2 F (36.8 C) 98.2 F (36.8 C)  98.6 F (37 C)  TempSrc: Oral Axillary  Oral  SpO2:  98%    Weight:   57.7 kg (127 lb 3.3 oz)   Height:        Intake/Output Summary (Last 24 hours) at 09/25/2017 1035 Last data filed at 09/25/2017 0550 Gross per 24 hour  Intake 2480 ml  Output 1175 ml  Net 1305 ml   Filed Weights   09/23/17 1932 09/24/17 0142 09/25/17 0545  Weight: 58.1 kg (128 lb) 56.6 kg (124 lb 12.5 oz) 57.7 kg (127 lb 3.3 oz)    Examination:  General exam: Appears calm and comfortable, in nad.  Respiratory system: Clear to auscultation. Respiratory effort normal. No wheezes Cardiovascular system: S1 & S2 heard, RRR. No JVD, murmurs, rubs, gallops or clicks.  Gastrointestinal system: Abdomen is nondistended, soft and nontender. No organomegaly or masses felt. Normal bowel sounds heard. Central nervous system: no facial asymmetry, sensation to light touch intact Extremities: warm, + pulses Skin: No rashes, lesions or ulcers, on limited exam. Psychiatry: Mood appropriate, judgement impaired    Data Reviewed: I have personally reviewed following labs and imaging studies  CBC: Recent Labs  Lab 09/23/17 1946 09/23/17 1950  WBC  --  9.9  NEUTROABS  --  6.8  HGB 14.6 12.2  HCT 43.0 38.4  MCV  --  100.5*  PLT  --  027   Basic Metabolic  Panel: Recent Labs  Lab 09/23/17 1946 09/23/17 1950 09/24/17 0232 09/25/17 0245  NA 137 133*  --  143  K 4.2 4.1  --  2.6*  CL 101 96*  --  111  CO2  --  20*  --  24  GLUCOSE 350* 360*  --  123*  BUN 23* 21*  --  11  CREATININE 1.90* 1.97*  --  1.42*  CALCIUM  --  9.2  --  8.3*  MG  --   --  1.5*  --    GFR: Estimated Creatinine Clearance: 29.1 mL/min (A) (by C-G formula based on SCr of 1.42 mg/dL (H)). Liver Function Tests: Recent Labs  Lab 09/23/17 1950  AST 55*  ALT 22  ALKPHOS 164*  BILITOT 0.6  PROT 6.9  ALBUMIN 2.5*   No results for input(s): LIPASE, AMYLASE in  the last 168 hours. Recent Labs  Lab 09/23/17 2330  AMMONIA 19   Coagulation Profile: No results for input(s): INR, PROTIME in the last 168 hours. Cardiac Enzymes: No results for input(s): CKTOTAL, CKMB, CKMBINDEX, TROPONINI in the last 168 hours. BNP (last 3 results) No results for input(s): PROBNP in the last 8760 hours. HbA1C: Recent Labs    09/24/17 0232  HGBA1C 7.0*   CBG: Recent Labs  Lab 09/24/17 1151 09/24/17 1758 09/25/17 0029 09/25/17 0638 09/25/17 0740  GLUCAP 216* 121* 132* 124* 99   Lipid Profile: Recent Labs    09/24/17 0232  CHOL 108  HDL 32*  LDLCALC 60  TRIG 81  CHOLHDL 3.4   Thyroid Function Tests: No results for input(s): TSH, T4TOTAL, FREET4, T3FREE, THYROIDAB in the last 72 hours. Anemia Panel: No results for input(s): VITAMINB12, FOLATE, FERRITIN, TIBC, IRON, RETICCTPCT in the last 72 hours. Sepsis Labs: Recent Labs  Lab 09/23/17 1947 09/23/17 2203  LATICACIDVEN 7.88* 1.42    Recent Results (from the past 240 hour(s))  Blood Culture (routine x 2)     Status: None (Preliminary result)   Collection Time: 09/23/17  7:50 PM  Result Value Ref Range Status   Specimen Description BLOOD LEFT HAND  Final   Special Requests IN PEDIATRIC BOTTLE Blood Culture adequate volume  Final   Culture NO GROWTH < 24 HOURS  Final   Report Status PENDING  Incomplete  Urine culture     Status: None   Collection Time: 09/23/17  8:09 PM  Result Value Ref Range Status   Specimen Description URINE, CATHETERIZED  Final   Special Requests NONE  Final   Culture NO GROWTH  Final   Report Status 09/25/2017 FINAL  Final  Blood Culture (routine x 2)     Status: None (Preliminary result)   Collection Time: 09/23/17  8:23 PM  Result Value Ref Range Status   Specimen Description BLOOD LEFT HAND  Final   Special Requests IN PEDIATRIC BOTTLE Blood Culture adequate volume  Final   Culture NO GROWTH < 24 HOURS  Final   Report Status PENDING  Incomplete  MRSA PCR  Screening     Status: None   Collection Time: 09/24/17  1:42 AM  Result Value Ref Range Status   MRSA by PCR NEGATIVE NEGATIVE Final    Comment:        The GeneXpert MRSA Assay (FDA approved for NASAL specimens only), is one component of a comprehensive MRSA colonization surveillance program. It is not intended to diagnose MRSA infection nor to guide or monitor treatment for MRSA infections.  Radiology Studies: Ct Head Wo Contrast  Result Date: 09/23/2017 CLINICAL DATA:  Altered level of consciousness. Patient is unresponsive with diagonal respirations. EXAM: CT HEAD WITHOUT CONTRAST TECHNIQUE: Contiguous axial images were obtained from the base of the skull through the vertex without intravenous contrast. COMPARISON:  08/26/2017 FINDINGS: Brain: Diffuse cerebral atrophy. Ventricular dilatation consistent with central atrophy. Low-attenuation changes in the deep white matter consistent with small vessel ischemia. Hazy appearance of the gray-white junctions in the left anterior frontal region could represent early changes of acute infarct. No mass-effect or midline shift. No abnormal extra-axial fluid collections. Basal cisterns are not effaced. No acute intracranial hemorrhage. Vascular: Intracranial arterial calcifications are present. Skull: Calvarium appears intact. Sinuses/Orbits: Paranasal sinuses and mastoid air cells are clear. Other: Nasal cannula. Nodular soft tissue density demonstrated in the subcutaneous fat adjacent to the base of the nose on the left. This is of uncertain etiology. It has been present on prior studies dating back to 08/19/2012, suggesting a benign process. IMPRESSION: 1. Hazy appearance to the gray-white junctions in the left anterior frontal lobe could represent early changes of acute infarct. No mass effect or midline shift. No acute intracranial hemorrhage. 2. Chronic atrophy and small vessel ischemic changes. 3. Nonspecific nodular soft tissue  density in the subcutaneous fat adjacent to the base of the nose on the left. Long-term stability since study dated 08/19/2012 suggests a benign process. Electronically Signed   By: Lucienne Capers M.D.   On: 09/23/2017 21:54   [name]  Result Date: 09/24/2017 CLINICAL DATA:  Altered mental status EXAM: MRI HEAD WITHOUT CONTRAST MRA HEAD WITHOUT CONTRAST TECHNIQUE: Multiplanar, multiecho pulse sequences of the brain and surrounding structures were obtained without intravenous contrast. Angiographic images of the head were obtained using MRA technique without contrast. COMPARISON:  Head CT 09/23/2017 FINDINGS: MRI HEAD FINDINGS Brain: The midline structures are normal. No focal diffusion restriction to indicate acute infarct. No intraparenchymal hemorrhage. There is multifocal hyperintense T2-weighted signal within the periventricular white matter, most often seen in the setting of chronic microvascular ischemia. No mass lesion. No chronic microhemorrhage or cerebral amyloid angiopathy. There is blooming on gradient echo imaging within the occipital horn of the right lateral ventricle, likely indicating old intraventricular blood products. The appearance is unchanged compared to the MRI of 06/22/2013. No dural abnormality or extra-axial collection. Skull and upper cervical spine: The visualized skull base, calvarium, upper cervical spine and extracranial soft tissues are normal. Sinuses/Orbits: No fluid levels or advanced mucosal thickening. No mastoid effusion. Normal orbits. MRA HEAD FINDINGS Time-of-flight angiography is severely degraded by patient motion. Within that limitation, both internal carotid arteries are patent. Both proximal middle cerebral arteries are patent. The basilar artery is patent and the proximal posterior cerebral arteries are patent. Examination is otherwise nondiagnostic. IMPRESSION: 1. No acute intracranial abnormality. 2. Chronic ischemic microangiopathy and unchanged  appearance of old blood products within the dependent right lateral ventricle at the site of remote hemorrhagic infarct. 3. Severely motion degraded MRA shows no emergent large vessel occlusion. Electronically Signed   By: Ulyses Jarred M.D.   On: 09/24/2017 01:15   Dg Chest Port 1 View  Result Date: 09/23/2017 CLINICAL DATA:  Altered mental status. EXAM: PORTABLE CHEST 1 VIEW COMPARISON:  09/10/2017 FINDINGS: The cardiac silhouette, mediastinal and hilar contours are normal and stable. There is tortuosity and calcification the thoracic aorta. The lungs are clear. No pleural effusion. Stable biapical pleural and parenchymal scarring changes. The bony thorax is intact. IMPRESSION:  No acute cardiopulmonary findings. Electronically Signed   By: Marijo Sanes M.D.   On: 09/23/2017 20:07   Mr Jodene Nam Head Wo Contrast  Result Date: 09/24/2017 CLINICAL DATA:  Altered mental status EXAM: MRI HEAD WITHOUT CONTRAST MRA HEAD WITHOUT CONTRAST TECHNIQUE: Multiplanar, multiecho pulse sequences of the brain and surrounding structures were obtained without intravenous contrast. Angiographic images of the head were obtained using MRA technique without contrast. COMPARISON:  Head CT 09/23/2017 FINDINGS: MRI HEAD FINDINGS Brain: The midline structures are normal. No focal diffusion restriction to indicate acute infarct. No intraparenchymal hemorrhage. There is multifocal hyperintense T2-weighted signal within the periventricular white matter, most often seen in the setting of chronic microvascular ischemia. No mass lesion. No chronic microhemorrhage or cerebral amyloid angiopathy. There is blooming on gradient echo imaging within the occipital horn of the right lateral ventricle, likely indicating old intraventricular blood products. The appearance is unchanged compared to the MRI of 06/22/2013. No dural abnormality or extra-axial collection. Skull and upper cervical spine: The visualized skull base, calvarium, upper cervical  spine and extracranial soft tissues are normal. Sinuses/Orbits: No fluid levels or advanced mucosal thickening. No mastoid effusion. Normal orbits. MRA HEAD FINDINGS Time-of-flight angiography is severely degraded by patient motion. Within that limitation, both internal carotid arteries are patent. Both proximal middle cerebral arteries are patent. The basilar artery is patent and the proximal posterior cerebral arteries are patent. Examination is otherwise nondiagnostic. IMPRESSION: 1. No acute intracranial abnormality. 2. Chronic ischemic microangiopathy and unchanged appearance of old blood products within the dependent right lateral ventricle at the site of remote hemorrhagic infarct. 3. Severely motion degraded MRA shows no emergent large vessel occlusion. Electronically Signed   By: Ulyses Jarred M.D.   On: 09/24/2017 01:15    Scheduled Meds: . aspirin  325 mg Oral Q breakfast  . atorvastatin  10 mg Oral Daily  . fludrocortisone  0.1 mg Oral Daily  . heparin  5,000 Units Subcutaneous Q8H  . insulin aspart  0-9 Units Subcutaneous Q6H  . magnesium oxide  400 mg Oral BID  . mouth rinse  15 mL Mouth Rinse BID  . pantoprazole  40 mg Oral Daily  . potassium chloride  60 mEq Oral Once  . vitamin C  500 mg Oral Daily   Continuous Infusions: . 0.9 % NaCl with KCl 40 mEq / L 125 mL/hr (09/25/17 0727)  . levETIRAcetam 500 mg (09/25/17 0942)  . piperacillin-tazobactam Stopped (09/25/17 0929)  . vancomycin Stopped (09/24/17 2253)     LOS: 1 day    Time spent: > 35 minutes  Velvet Bathe, MD [facility] Pager [phone]  If 7PM-7AM, please contact night-coverage [email or web address] Password [facility] 09/25/2017, 10:35 AM

## 2017-09-25 NOTE — Evaluation (Signed)
Speech Language Pathology Evaluation Patient Details Name: [name] [name] MRN: [id number] DOB: 08-17-1941 Today's Date: 09/25/2017 Time: 7893-8101 SLP Time Calculation (min) (ACUTE ONLY): 11 min  Problem List:  Patient Active Problem List   Diagnosis Date Noted  . Hypotension 09/24/2017  . Chronic diastolic CHF (congestive heart failure) ([place]) 09/24/2017  . Fever   . Purulent dermatitis   . Rhonchi   . Palliative care encounter   . Encounter for hospice care discussion   . DNR (do not resuscitate) discussion   . Postoperative wound infection of right hip   . Status epilepticus ([place]) 08/26/2017  . S/P right hip fracture 08/26/2017  . Status post total hip replacement, right 08/10/2017  . Closed right hip fracture, initial encounter ([place]) 07/27/2017  . Hip fracture ([place]) 07/27/2017  . Pressure injury of skin 06/14/2017  . Encounter for palliative care   . Goals of care, counseling/discussion   . CAP (community acquired pneumonia) 06/12/2017  . Sepsis ([place]) 06/11/2017  . Weakness 06/11/2017  . Anemia 06/11/2017  . Acute kidney injury superimposed on chronic kidney disease ([place]) 06/05/2017  . Sepsis, unspecified organism ([place]) 06/05/2017  . Partial symptomatic epilepsy with complex partial seizures, not intractable, without status epilepticus ([place])   . Bacteremia due to Escherichia coli 11/18/2016  . Renal insufficiency   . Aortic atherosclerosis ([place]) 11/15/2016  . UTI (urinary tract infection) 11/15/2016  . Paranoia ([place]) 05/24/2015  . Tachycardia 07/02/2013  . Hypophosphatemia 07/02/2013  . Encounter for central line placement 07/02/2013  . Hypokalemia 07/01/2013  . Hypomagnesemia 07/01/2013  . Protein-calorie malnutrition, severe ([place]) 06/29/2013  . Leukocytosis, unspecified 06/29/2013  . 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 TIA (transient ischemic attack) 12/13/2012  . 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 Loss of consciousness ([place])  09/08/2012  . Acute respiratory failure with hypoxia ([place]) 08/20/2012  . Seizure disorder, grand mal ([place]) 08/20/2012  . Seizure ([place]) 08/20/2012  . CKD (chronic kidney disease) stage 3, GFR 30-59 ml/min (HCC) 08/19/2012  . Leukocytosis 08/19/2012  . Hemorrhage in the brain-13 x 22 x 14 mm right posterior temporo-occipital intra-axial acute 08/19/2012  . Left hip pain 08/19/2012  . Left hemiparesis ([place]) 08/19/2012  . Hemianopia, homonymous, left 08/19/2012  . Hemisensory deficit, left 08/19/2012  . Nausea 08/19/2012  . Headache(784.0) 08/19/2012  . Acute encephalopathy 08/19/2012  . Hemorrhage of brain, nontraumatic ([place]) 08/18/2012  . Dysphagia 08/18/2012  . HTN (hypertension) 08/18/2012  . DM type 2 (diabetes mellitus, type 2) ([place]) 07/26/2012  . Hypoglycemia due to insulin 07/26/2012  . GERD (gastroesophageal reflux disease)    Past Medical History:  Past Medical History:  Diagnosis Date  . Anemia   . Arthritis    "a touch in my hands" (11/17/2016)  . Chronic back pain   . Chronic kidney disease (CKD), active medical management without dialysis, stage 3 (moderate) ([place])   . Depression   . GERD (gastroesophageal reflux disease)   . High cholesterol   . Hyperlipidemia   . Hypertension   . Seizures ([place])    last seizure 2014 (11/17/2016)  . Stroke [facility]) 2013   Secondary to acute right posterior temporo-occipital intra-axial hemorrhage  . Type II diabetes mellitus ([place])    Past Surgical History:  Past Surgical History:  Procedure Laterality Date  . CATARACT EXTRACTION W/ INTRAOCULAR LENS IMPLANT Bilateral   . COLONOSCOPY    . DILATION AND CURETTAGE OF UTERUS  X 3  . 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 I&D EXTREMITY Right 09/06/2017   Procedure: IRRIGATION AND DEBRIDEMENT HIP;  Surgeon: Mcarthur Rossetti, MD;  Location: [facility];  Service: Orthopedics;  Laterality: Right;  . TOTAL HIP ARTHROPLASTY Right 07/28/2017   Procedure: TOTAL HIP ARTHROPLASTY ANTERIOR APPROACH;  Surgeon: Mcarthur Rossetti, MD;  Location: [facility];  Service: Orthopedics;  Laterality: Right;   HPI:  Pt is a 76 y.o.femaleadmitted after syncopal event with concern for seizure and possible CVA. MRI negative for acute infarct. Pt has been seen by SLP previously as recently as earlier this month, at which time the pt's husband, in discussion with palliative care, wanted her to have comfort feeds. SLP suggested pureed foods and thin liquids given pt's cognitively-based dysphagia. PMH also includes: anemia, CKD,CVA,depression, GERD, HLD, HTN,complex partial seizuredisorder, DM,E. coli bacteremia8/2018, and rightHip fractures/p repair in 9/24and subsequent infection requiring I&D on 09/06/2017.   Assessment / Plan / Recommendation Clinical Impression  Pt is oriented to person only, although she has some insight into her recent hip injury and need to be in the hospital. She seems to not hear me well consistently, but when she does, she follows simple commands with Min cues. She is very verbal, although a moderate amount of what she is saying appears to be confabulated. Mod cues were provided for sustained attention to functional tasks. Although she has cognitive and communicative deficits, she is certainly functioning at a higher level than she was when SLP saw her during her recent admission earlier this month. This, along with her negative MRI, make me suspect that she is at or near her baseline (her husband did acknowledge baseline cognitive deficits during her recent admission). SLP will continue to follow for dysphagia goals only, but recommend 24/7 supervision upon return home.    SLP Assessment  SLP Recommendation/Assessment: Patient does not need any further Speech Lanaguage Pathology Services SLP Visit Diagnosis: Cognitive communication  deficit (R41.841)    Follow Up Recommendations  24 hour supervision/assistance    Frequency and Duration           SLP Evaluation Cognition  Overall Cognitive Status: History of cognitive impairments - at baseline Orientation Level: Oriented to person;Oriented to time;Disoriented to situation;Disoriented to place       Comprehension  Auditory Comprehension Overall Auditory Comprehension: Impaired at baseline    Expression Expression Primary Mode of Expression: Verbal Verbal Expression Overall Verbal Expression: Appears within functional limits for tasks assessed   Oral / Motor  Oral Motor/Sensory Function Overall Oral Motor/Sensory Function: Within functional limits Motor Speech Overall Motor Speech: (mildly slurred - unsure of baseline)   GO                    [name] 09/25/2017, 9:31 AM   [name], M.A. CCC-SLP [phone]

## 2017-09-25 NOTE — Evaluation (Signed)
Physical Therapy Evaluation Patient Details Name: [name] [name] MRN: [id number] DOB: Nov 11, 1940 Today's Date: 09/25/2017   History of Present Illness  Pt adm after unresponsive episode and acute encephalopathy. PMH - seizure disorder, ICH, HTN, rt hip fx with ORIF 07/2017, hip soft tissue debridement 11/18, dm, ckd, depression  Clinical Impression  Pt presents with dependencies in mobility. Husband has been caring for pt at home primarily at bed level since hip fx in Sept. Per chart husband reports they can't afford further SNF due to cost. Therefore recommend continued HHPT.     Follow Up Recommendations Home health PT;Supervision/Assistance - 24 hour(could benefit from SNF but per chart can't afford copay)    Equipment Recommendations  None recommended by PT    Recommendations for Other Services       Precautions / Restrictions Precautions Precautions: Fall Precaution Comments: watch orthostatics - history of orthostatic hypotension Restrictions Weight Bearing Restrictions: No      Mobility  Bed Mobility Overal bed mobility: Needs Assistance Bed Mobility: Supine to Sit;Sit to Supine     Supine to sit: +2 for physical assistance;Max assist Sit to supine: +2 for physical assistance;Mod assist   General bed mobility comments: Assist to move legs off bed, elevate trunk into sitting, and bring hips to EOB. Assist to bring legs back into bed and lower trunk when returning to supine.  Transfers Overall transfer level: Needs assistance Equipment used: 1 person hand held assist Transfers: Sit to/from Stand Sit to Stand: Mod assist;+2 safety/equipment         General transfer comment: Assist to bring hips up. Bilateral forearm support. Verbal/tactile cues to stand more erect  Ambulation/Gait                Stairs            Wheelchair Mobility    Modified Rankin (Stroke Patients Only)       Balance Overall balance assessment: Needs  assistance Sitting-balance support: No upper extremity supported;Feet supported Sitting balance-Leahy Scale: Fair Sitting balance - Comments: Pt sat EOB x 15 minutes with supervision   Standing balance support: Bilateral upper extremity supported Standing balance-Leahy Scale: Poor Standing balance comment: Stood with bil forearm support x 1 minute.                             Pertinent Vitals/Pain Pain Assessment: Faces Faces Pain Scale: No hurt    Home Living Family/patient expects to be discharged to:: Private residence Living Arrangements: Spouse/significant other Available Help at Discharge: Family;Available 24 hours/day Type of Home: House Home Access: Stairs to enter Entrance Stairs-Rails: None Entrance Stairs-Number of Steps: 1-- tall 8' step Home Layout: One level Home Equipment: Walker - 2 wheels;Bedside commode;Wheelchair - manual      Prior Function Level of Independence: Needs assistance   Gait / Transfers Assistance Needed: Pt reports pt primarily stays in bed, occasionally pt wants to get up and husband will transfer her           Hand Dominance   Dominant Hand: Right    Extremity/Trunk Assessment   Upper Extremity Assessment Upper Extremity Assessment: Defer to OT evaluation    Lower Extremity Assessment Lower Extremity Assessment: RLE deficits/detail;LLE deficits/detail RLE Deficits / Details: grossly 3-/5 LLE Deficits / Details: grossly 3-/5       Communication   Communication: HOH  Cognition Arousal/Alertness: Awake/alert Behavior During Therapy: WFL for tasks assessed/performed Overall Cognitive Status: History of cognitive  impairments - at baseline                                        General Comments      Exercises General Exercises - Lower Extremity Long Arc Quad: AAROM;10 reps;Seated   Assessment/Plan    PT Assessment Patient needs continued PT services  PT Problem List Decreased  strength;Decreased activity tolerance;Decreased balance;Decreased mobility;Decreased cognition;Decreased knowledge of use of DME       PT Treatment Interventions DME instruction;Gait training;Functional mobility training;Therapeutic activities;Therapeutic exercise;Balance training;Patient/family education    PT Goals (Current goals can be found in the Care Plan section)  Acute Rehab PT Goals PT Goal Formulation: With patient/family Time For Goal Achievement: 10/09/17 Potential to Achieve Goals: Fair    Frequency Min 2X/week   Barriers to discharge        Co-evaluation PT/OT/SLP Co-Evaluation/Treatment: Yes Reason for Co-Treatment: For patient/therapist safety PT goals addressed during session: Mobility/safety with mobility         AM-PAC PT "6 Clicks" Daily Activity  Outcome Measure Difficulty turning over in bed (including adjusting bedclothes, sheets and blankets)?: Unable Difficulty moving from lying on back to sitting on the side of the bed? : Unable Difficulty sitting down on and standing up from a chair with arms (e.g., wheelchair, bedside commode, etc,.)?: Unable Help needed moving to and from a bed to chair (including a wheelchair)?: A Lot Help needed walking in hospital room?: Total Help needed climbing 3-5 steps with a railing? : Total 6 Click Score: 7    End of Session Equipment Utilized During Treatment: Gait belt Activity Tolerance: Patient tolerated treatment well Patient left: in bed;with call bell/phone within reach;with bed alarm set;with family/visitor present Nurse Communication: Mobility status PT Visit Diagnosis: Other abnormalities of gait and mobility (R26.89);History of falling (Z91.81);Difficulty in walking, not elsewhere classified (R26.2);Muscle weakness (generalized) (M62.81)    Time: 3559-7416 PT Time Calculation (min) (ACUTE ONLY): 19 min   Charges:   PT Evaluation $PT Eval Moderate Complexity: 1 Mod     PT G CodesMarland Kitchen        [facility] PT [place] 09/25/2017, 2:21 PM

## 2017-09-25 NOTE — Progress Notes (Signed)
  Speech Language Pathology Treatment: Dysphagia  Patient Details Name: [name] [name] MRN: [id number] DOB: Jun 03, 1941 Today's Date: 09/25/2017 Time: 8366-2947 SLP Time Calculation (min) (ACUTE ONLY): 8 min  Assessment / Plan / Recommendation Clinical Impression  Pt is much more alert and interactive this morning and as a result she is consuming POs with more automaticity. She still needs Mod cues for clearance of oral residue adhered to her hard palate after trials of soft solids. Recommend initiating Dys 2 diet and thin liquids. SLP will f/u for tolerance.   HPI HPI: Pt is a 76 y.o.femaleadmitted after syncopal event with concern for seizure and possible CVA. MRI negative for acute infarct. Pt has been seen by SLP previously as recently as earlier this month, at which time the pt's husband, in discussion with palliative care, wanted her to have comfort feeds. SLP suggested pureed foods and thin liquids given pt's cognitively-based dysphagia. PMH also includes: anemia, CKD,CVA,depression, GERD, HLD, HTN,complex partial seizuredisorder, DM,E. coli bacteremia8/2018, and rightHip fractures/p repair in 9/24and subsequent infection requiring I&D on 09/06/2017.      SLP Plan  Goals updated       Recommendations  Diet recommendations: Dysphagia 2 (fine chop);Thin liquid Liquids provided via: Cup;Straw Medication Administration: Whole meds with puree Supervision: Full supervision/cueing for compensatory strategies;Patient able to self feed Compensations: Minimize environmental distractions;Slow rate;Small sips/bites Postural Changes and/or Swallow Maneuvers: Seated upright 90 degrees                Oral Care Recommendations: Oral care BID Follow up Recommendations: 24 hour supervision/assistance SLP Visit Diagnosis: Dysphagia, oral phase (R13.11) Plan: Goals updated       GO                [name] 09/25/2017, 9:23 AM  [name], M.A.  CCC-SLP [phone]

## 2017-09-25 NOTE — Evaluation (Signed)
Occupational Therapy Evaluation and Defer to Next Venue Patient Details Name: [name] [name] MRN: [id number] DOB: 1940-11-05 Today's Date: 09/25/2017    History of Present Illness Pt adm after unresponsive episode and acute encephalopathy. PMH - seizure disorder, ICH, HTN, rt hip fx with ORIF 07/2017, hip soft tissue debridement 11/18, dm, ckd, depression   Clinical Impression   Pt presents with dependencies in ADL and functional transfers, and is max to total A for all ADL, mod A for sit to stand transfers. Husband has been caring for pt at home primarily at bed level since hip fx in Sept. Per chart husband reports they can't afford further SNF due to cost. Therefore recommend resume HHOT, and will defer services to home environment.    Follow Up Recommendations  Home health OT;Supervision/Assistance - 24 hour    Equipment Recommendations  None recommended by OT(Pt has appropriate DME)    Recommendations for Other Services       Precautions / Restrictions Precautions Precautions: Fall Precaution Comments: watch orthostatics - history of orthostatic hypotension Restrictions Weight Bearing Restrictions: No      Mobility Bed Mobility Overal bed mobility: Needs Assistance Bed Mobility: Supine to Sit;Sit to Supine     Supine to sit: +2 for physical assistance;Max assist Sit to supine: +2 for physical assistance;Mod assist   General bed mobility comments: Assist to move legs off bed, elevate trunk into sitting, and bring hips to EOB. Assist to bring legs back into bed and lower trunk when returning to supine.  Transfers Overall transfer level: Needs assistance Equipment used: 1 person hand held assist Transfers: Sit to/from Stand Sit to Stand: Mod assist;+2 safety/equipment         General transfer comment: Assist to bring hips up. Bilateral forearm support. Verbal/tactile cues to stand more erect    Balance Overall balance assessment: Needs assistance Sitting-balance  support: No upper extremity supported;Feet supported Sitting balance-Leahy Scale: Fair Sitting balance - Comments: Pt sat EOB x 15 minutes with supervision   Standing balance support: Bilateral upper extremity supported Standing balance-Leahy Scale: Poor Standing balance comment: Stood with bil forearm support x 1 minute.                           ADL either performed or assessed with clinical judgement   ADL Overall ADL's : Needs assistance/impaired Eating/Feeding: Maximal assistance;Bed level   Grooming: Wash/dry hands;Wash/dry face;Minimal assistance;Sitting Grooming Details (indicate cue type and reason): EOB Upper Body Bathing: Maximal assistance;Bed level   Lower Body Bathing: Total assistance;Bed level   Upper Body Dressing : Maximal assistance   Lower Body Dressing: Total assistance   Toilet Transfer: Moderate assistance;+2 for safety/equipment;Squat-pivot   Toileting- Clothing Manipulation and Hygiene: Total assistance     Tub/Shower Transfer Details (indicate cue type and reason): sponge bath only         Vision         Perception     Praxis      Pertinent Vitals/Pain Pain Assessment: Faces Faces Pain Scale: No hurt Pain Intervention(s): Monitored during session     Hand Dominance Right   Extremity/Trunk Assessment Upper Extremity Assessment Upper Extremity Assessment: Generalized weakness   Lower Extremity Assessment Lower Extremity Assessment: Defer to PT evaluation RLE Deficits / Details: grossly 3-/5 LLE Deficits / Details: grossly 3-/5   Cervical / Trunk Assessment Cervical / Trunk Assessment: Kyphotic   Communication Communication Communication: HOH   Cognition Arousal/Alertness: Awake/alert Behavior During Therapy: [facility]  for tasks assessed/performed Overall Cognitive Status: History of cognitive impairments - at baseline                                     General Comments  Pt husband present for  session    Exercises General Exercises - Lower Extremity Long Arc Quad: AAROM;10 reps;Seated   Shoulder Instructions      Home Living Family/patient expects to be discharged to:: Private residence Living Arrangements: Spouse/significant other Available Help at Discharge: Family;Available 24 hours/day Type of Home: House Home Access: Stairs to enter [employer] of Steps: 1-- tall 8' step Entrance Stairs-Rails: None Home Layout: One level     Bathroom Shower/Tub: Teacher, early years/pre: Standard Bathroom Accessibility: Yes   Home Equipment: Walker - 2 wheels;Bedside commode;Wheelchair - manual   Additional Comments: Pt has been getting HHOT/PT since last admission  Lives With: Spouse    Prior Functioning/Environment Level of Independence: Needs assistance  Gait / Transfers Assistance Needed: Pt reports pt primarily stays in bed, occasionally pt wants to get up and husband will transfer her ADL's / Homemaking Assistance Needed: Spouse does all ADLs and IADLs.             OT Problem List: Decreased strength;Impaired balance (sitting and/or standing)      OT Treatment/Interventions:      OT Goals(Current goals can be found in the care plan section) Acute Rehab OT Goals Patient Stated Goal: to get her stronger and stay out of hospital OT Goal Formulation: With family Time For Goal Achievement: 10/09/17 Potential to Achieve Goals: Fair  OT Frequency:     Barriers to D/C:            Co-evaluation PT/OT/SLP Co-Evaluation/Treatment: Yes Reason for Co-Treatment: For patient/therapist safety PT goals addressed during session: Mobility/safety with mobility OT goals addressed during session: ADL's and self-care      AM-PAC PT "6 Clicks" Daily Activity     Outcome Measure Help from another person eating meals?: Total Help from another person taking care of personal grooming?: A Lot Help from another person toileting, which includes using  toliet, bedpan, or urinal?: A Lot Help from another person bathing (including washing, rinsing, drying)?: A Lot Help from another person to put on and taking off regular upper body clothing?: A Lot Help from another person to put on and taking off regular lower body clothing?: Total 6 Click Score: 10   End of Session Equipment Utilized During Treatment: Gait belt Nurse Communication: Mobility status  Activity Tolerance: Patient tolerated treatment well Patient left: in bed;with call bell/phone within reach;with family/visitor present;with bed alarm set  OT Visit Diagnosis: Cognitive communication deficit (R41.841);Muscle weakness (generalized) (M62.81)                Time: 9379-0240 OT Time Calculation (min): 26 min Charges:  OT General Charges $OT Visit: 1 Visit OT Evaluation $OT Eval Moderate Complexity: 1 Mod G-Codes:     Hulda Humphrey OTR/L [place] 09/25/2017, 5:18 PM

## 2017-09-25 NOTE — Progress Notes (Signed)
Pharmacy Antibiotic Note  [name] [name] is a 76 y.o. female admitted on 09/23/2017 with pneumonia.  Pharmacy has been consulted for levofloxacin dosing. CrCl ~29 ml/min, cultures negative so far.  Plan: -Stop vancomycin and Zosyn -Levofloxacin 250mg  PO daily  Height: 5\' 4"  (162.6 cm) Weight: 127 lb 3.3 oz (57.7 kg) IBW/kg (Calculated) : 54.7  Temp (24hrs), Avg:98.1 F (36.7 C), Min:97.5 F (36.4 C), Max:98.6 F (37 C)  Recent Labs  Lab 09/23/17 1946 09/23/17 1947 09/23/17 1950 09/23/17 2203 09/25/17 0245  WBC  --   --  9.9  --   --   CREATININE 1.90*  --  1.97*  --  1.42*  LATICACIDVEN  --  7.88*  --  1.42  --     Estimated Creatinine Clearance: 29.1 mL/min (A) (by C-G formula based on SCr of 1.42 mg/dL (H)).    Allergies  Allergen Reactions  . Barbiturates Other (See Comments)    Restlessness  . Other Other (See Comments)    "Strong meds" ("like sleeping pills") = Restlessness  . Latex Itching    Antimicrobials this admission: Vanc 11/21 >>11/23 Zosyn 11/22 >>11/23 LQ 11/23 >>  Dose adjustments this admission: none  Microbiology results: 11/21 BCx: NGTD 11/21 UCx: no growth 11/22 MRSA: negative  Thank you for allowing pharmacy to be a part of this patient's care.  Arrie Senate, PharmD, BCPS PGY-2 Cardiology Pharmacy Resident Pager: [phone] 09/25/2017

## 2017-09-26 LAB — BASIC METABOLIC PANEL
Anion gap: 8 (ref 5–15)
BUN: 7 mg/dL (ref 6–20)
CHLORIDE: 113 mmol/L — AB (ref 101–111)
CO2: 21 mmol/L — AB (ref 22–32)
CREATININE: 1.36 mg/dL — AB (ref 0.44–1.00)
Calcium: 8.3 mg/dL — ABNORMAL LOW (ref 8.9–10.3)
GFR calc Af Amer: 43 mL/min — ABNORMAL LOW (ref >60)
GFR calc non Af Amer: 37 mL/min — ABNORMAL LOW (ref >60)
GLUCOSE: 172 mg/dL — AB (ref 65–99)
POTASSIUM: 3.5 mmol/L (ref 3.5–5.1)
Sodium: 142 mmol/L (ref 135–145)

## 2017-09-26 LAB — GLUCOSE, CAPILLARY
GLUCOSE-CAPILLARY: 143 mg/dL — AB (ref 65–99)
GLUCOSE-CAPILLARY: 180 mg/dL — AB (ref 65–99)
GLUCOSE-CAPILLARY: 144 mg/dL — AB (ref 65–99)
Glucose-Capillary: 157 mg/dL — ABNORMAL HIGH (ref 65–99)
Glucose-Capillary: 195 mg/dL — ABNORMAL HIGH (ref 65–99)

## 2017-09-26 NOTE — Discharge Summary (Signed)
Physician Discharge Summary  [name] [name] IEP:[id number] DOB: 1941-04-04 DOA: 09/23/2017  PCP: [name] Roers, MD  Admit date: 09/23/2017 Discharge date: 09/26/2017  Time spent: > 35 minutes  Recommendations for Outpatient Follow-up:  1. Please ensure patient follows up with neurologist 2. Encourage compliance with medication regimen.   Discharge Diagnoses:  Principal Problem:   Acute encephalopathy Active Problems:   GERD (gastroesophageal reflux disease)   DM type 2 (diabetes mellitus, type 2) (HCC)   Seizure disorder, grand mal ([place])   Loss of consciousness ([place])   Acute kidney injury superimposed on chronic kidney disease ([place])   Hypotension   Chronic diastolic CHF (congestive heart failure) ([place])   Discharge Condition: stable  Diet recommendation: dysphagia 2 diet  Filed Weights   09/24/17 0142 09/25/17 0545 09/26/17 0352  Weight: 56.6 kg (124 lb 12.5 oz) 57.7 kg (127 lb 3.3 oz) 60.5 kg (133 lb 6.1 oz)    History of present illness:  76 y.o. female with medical history significant of  anemia, CKD, CVA, depression, GERD, HLD, HTN, complex partial seizure disorder, DM, E. coli bacteremia 06/2017, and right Hip fractures/p repair in 9/24 and subsequent infection requiring I&D on 09/06/2017; who presents after being found unresponsive while sitting on the commode.    Hospital Course:  Unresponsive state/syncope - suspecting breakthrough seizure and dehydration - resolved with fluids and with placing back on her home medication regimen  For other known medical conditions listed above will continue prior to admission medication regimen listed below.   Procedures:  None  Consultations:  None  Discharge Exam: Vitals:   09/26/17 0724 09/26/17 1131  BP: (!) 125/99 133/60  Pulse: (!) 101 (!) 106  Resp: 18 15  Temp: 98.6 F (37 C) 98.9 F (37.2 C)  SpO2: 99% 96%    General: Pt in nad, alert and awake Cardiovascular: rrr, no rubs Respiratory: no increased wob,  no wheezes  Discharge Instructions    Current Discharge Medication List    CONTINUE these medications which have NOT CHANGED   Details  aspirin EC 325 MG EC tablet Take 1 tablet (325 mg total) by mouth daily with breakfast. Qty: 30 tablet, Refills: 0    atorvastatin (LIPITOR) 10 MG tablet Take 10 mg by mouth daily.     fludrocortisone (FLORINEF) 0.1 MG tablet Take 0.1 mg by mouth daily. AND MONITOR STANDING B/P DAILY Refills: 2    LEVEMIR FLEXTOUCH 100 UNIT/ML Pen INJECT 10 UNITS SUBCUTANEOUSLY DAILY AT NIGHT Qty: 15 mL, Refills: 2    levETIRAcetam (KEPPRA) 100 MG/ML solution Take 5 mLs (500 mg total) 2 (two) times daily by mouth. Qty: 473 mL, Refills: 0    Magnesium 500 MG TABS Take 1 tablet (500 mg total) by mouth 2 (two) times daily.    metoprolol succinate (TOPROL-XL) 25 MG 24 hr tablet Take 1 tablet (25 mg total) by mouth daily. Qty: 30 tablet, Refills: 0    Multiple Vitamin (MULTIVITAMIN) capsule Take 1 capsule by mouth daily.    NOVOLOG FLEXPEN 100 UNIT/ML SOPN FlexPen Inject 4-9 Units into the skin See admin instructions. Three times a day with meals per sliding scale: 4 units with breakfast then 4 units with lunch (but increase to 5 units if BGL is >200) and 8 units with supper (but increase to 9 units if BGL is >200)    omeprazole (PRILOSEC) 20 MG capsule Take 20 mg by mouth every morning.  Refills: 5    vitamin C (ASCORBIC ACID) 500 MG tablet Take 500  mg by mouth daily.    ZINC OXIDE, TOPICAL, 10 % CREA Apply 1 application topically See admin instructions. TWO TO THREE TIMES A DAY TO IRRITATED AREAS OF BUTTOCKS    ondansetron (ZOFRAN ODT) 4 MG disintegrating tablet Take 1 tablet (4 mg total) by mouth every 8 (eight) hours as needed for nausea or vomiting. Qty: 20 tablet, Refills: 0       Allergies  Allergen Reactions  . Barbiturates Other (See Comments)    Restlessness  . Other Other (See Comments)    "Strong meds" ("like sleeping pills") = Restlessness   . Latex Itching      The results of significant diagnostics from this hospitalization (including imaging, microbiology, ancillary and laboratory) are listed below for reference.    Significant Diagnostic Studies: Ct Head Wo [name]  Result Date: 09/23/2017 CLINICAL DATA:  Altered level of consciousness. Patient is unresponsive with diagonal respirations. EXAM: CT HEAD WITHOUT [name] TECHNIQUE: Contiguous axial images were obtained from the base of the skull through the vertex without intravenous [name]. COMPARISON:  08/26/2017 FINDINGS: Brain: Diffuse cerebral atrophy. Ventricular dilatation consistent with central atrophy. Low-attenuation changes in the deep white matter consistent with small vessel ischemia. Hazy appearance of the gray-white junctions in the left anterior frontal region could represent early changes of acute infarct. No mass-effect or midline shift. No abnormal extra-axial fluid collections. Basal cisterns are not effaced. No acute intracranial hemorrhage. Vascular: Intracranial arterial calcifications are present. Skull: Calvarium appears intact. Sinuses/Orbits: Paranasal sinuses and mastoid air cells are clear. Other: Nasal cannula. Nodular soft tissue density demonstrated in the subcutaneous fat adjacent to the base of the nose on the left. This is of uncertain etiology. It has been present on prior studies dating back to 08/19/2012, suggesting a benign process. IMPRESSION: 1. Hazy appearance to the gray-white junctions in the left anterior frontal lobe could represent early changes of acute infarct. No mass effect or midline shift. No acute intracranial hemorrhage. 2. Chronic atrophy and small vessel ischemic changes. 3. Nonspecific nodular soft tissue density in the subcutaneous fat adjacent to the base of the nose on the left. Long-term stability since study dated 08/19/2012 suggests a benign process. Electronically Signed   By: Lucienne Capers M.D.   On: 09/23/2017 21:54    [name] [name]  Result Date: 09/24/2017 CLINICAL DATA:  Altered mental status EXAM: MRI HEAD WITHOUT [name] MRA HEAD WITHOUT [name] TECHNIQUE: Multiplanar, multiecho pulse sequences of the brain and surrounding structures were obtained without intravenous [name]. Angiographic images of the head were obtained using MRA technique without [name]. COMPARISON:  Head CT 09/23/2017 FINDINGS: MRI HEAD FINDINGS Brain: The midline structures are normal. No focal diffusion restriction to indicate acute infarct. No intraparenchymal hemorrhage. There is multifocal hyperintense T2-weighted signal within the periventricular white matter, most often seen in the setting of chronic microvascular ischemia. No mass lesion. No chronic microhemorrhage or cerebral amyloid angiopathy. There is blooming on gradient echo imaging within the occipital horn of the right lateral ventricle, likely indicating old intraventricular blood products. The appearance is unchanged compared to the MRI of 06/22/2013. No dural abnormality or extra-axial collection. Skull and upper cervical spine: The visualized skull base, calvarium, upper cervical spine and extracranial soft tissues are normal. Sinuses/Orbits: No fluid levels or advanced mucosal thickening. No mastoid effusion. Normal orbits. MRA HEAD FINDINGS Time-of-flight angiography is severely degraded by patient motion. Within that limitation, both internal carotid arteries are patent. Both proximal middle cerebral arteries are patent. The basilar artery is patent  and the proximal posterior cerebral arteries are patent. Examination is otherwise nondiagnostic. IMPRESSION: 1. No acute intracranial abnormality. 2. Chronic ischemic microangiopathy and unchanged appearance of old blood products within the dependent right lateral ventricle at the site of remote hemorrhagic infarct. 3. Severely motion degraded MRA shows no emergent large vessel occlusion. Electronically Signed   By:  Ulyses Jarred M.D.   On: 09/24/2017 01:15   Dg Chest Port 1 View  Result Date: 09/23/2017 CLINICAL DATA:  Altered mental status. EXAM: PORTABLE CHEST 1 VIEW COMPARISON:  09/10/2017 FINDINGS: The cardiac silhouette, mediastinal and hilar contours are normal and stable. There is tortuosity and calcification the thoracic aorta. The lungs are clear. No pleural effusion. Stable biapical pleural and parenchymal scarring changes. The bony thorax is intact. IMPRESSION: No acute cardiopulmonary findings. Electronically Signed   By: Marijo Sanes M.D.   On: 09/23/2017 20:07   Dg Chest Port 1 View  Result Date: 09/10/2017 CLINICAL DATA:  New onset fever. EXAM: PORTABLE CHEST 1 VIEW COMPARISON:  Chest x-ray dated 09/05/2017. FINDINGS: Heart size and mediastinal contours are within normal limits. Atherosclerotic changes noted at the aortic arch. Left-sided PICC line in place with tip at the level of the upper SVC. Lungs are clear. No pleural effusion or pneumothorax seen. No acute or suspicious osseous finding. IMPRESSION: 1. No active disease.  No evidence of pneumonia or pulmonary edema. 2. Left-sided PICC line in place with tip at the level of the upper SVC. Consider advancing approximately 5 cm for more optimal radiographic positioning. 3. Aortic atherosclerosis. These results will be called to the ordering clinician or representative by the Radiologist Assistant, and communication documented in the PACS or zVision Dashboard. Electronically Signed   By: Franki Cabot M.D.   On: 09/10/2017 15:48   Dg Chest Port 1 View  Result Date: 09/05/2017 CLINICAL DATA:  76 year old female with a history of respiratory failure EXAM: PORTABLE CHEST 1 VIEW COMPARISON:  08/26/2017 FINDINGS: Cardiomediastinal silhouette unchanged. Compare to the prior there is mild increase prominence of the interstitial markings with interlobular septal thickening. No confluent airspace disease. No pneumothorax or pleural effusion. Persistent  pleuroparenchymal scarring at the lung apices. Enteric feeding tube terminates out of the field of view. No displaced fracture. IMPRESSION: Coarsened interstitial markings with mild interlobular septal thickening may represent chronic changes, however, early pulmonary edema not excluded. No evidence of multifocal pneumonia. Enteric tube terminates out of the field of view. Electronically Signed   By: Corrie Mckusick D.O.   On: 09/05/2017 09:32   [name] Jodene Nam Head Wo [name]  Result Date: 09/24/2017 CLINICAL DATA:  Altered mental status EXAM: MRI HEAD WITHOUT [name] MRA HEAD WITHOUT [name] TECHNIQUE: Multiplanar, multiecho pulse sequences of the brain and surrounding structures were obtained without intravenous [name]. Angiographic images of the head were obtained using MRA technique without [name]. COMPARISON:  Head CT 09/23/2017 FINDINGS: MRI HEAD FINDINGS Brain: The midline structures are normal. No focal diffusion restriction to indicate acute infarct. No intraparenchymal hemorrhage. There is multifocal hyperintense T2-weighted signal within the periventricular white matter, most often seen in the setting of chronic microvascular ischemia. No mass lesion. No chronic microhemorrhage or cerebral amyloid angiopathy. There is blooming on gradient echo imaging within the occipital horn of the right lateral ventricle, likely indicating old intraventricular blood products. The appearance is unchanged compared to the MRI of 06/22/2013. No dural abnormality or extra-axial collection. Skull and upper cervical spine: The visualized skull base, calvarium, upper cervical spine and extracranial soft tissues are normal.  Sinuses/Orbits: No fluid levels or advanced mucosal thickening. No mastoid effusion. Normal orbits. MRA HEAD FINDINGS Time-of-flight angiography is severely degraded by patient motion. Within that limitation, both internal carotid arteries are patent. Both proximal middle cerebral arteries are patent. The  basilar artery is patent and the proximal posterior cerebral arteries are patent. Examination is otherwise nondiagnostic. IMPRESSION: 1. No acute intracranial abnormality. 2. Chronic ischemic microangiopathy and unchanged appearance of old blood products within the dependent right lateral ventricle at the site of remote hemorrhagic infarct. 3. Severely motion degraded MRA shows no emergent large vessel occlusion. Electronically Signed   By: Ulyses Jarred M.D.   On: 09/24/2017 01:15   Dg Hip Port Unilat With Pelvis 1v Right  Result Date: 09/05/2017 CLINICAL DATA:  Right hip drainage. Status post right hip replacement on 07/28/2017. Clinical diagnosis of purulent dermatitis. EXAM: DG HIP (WITH OR WITHOUT PELVIS) 1V PORT RIGHT COMPARISON:  07/28/2017 and 07/27/2017. FINDINGS: Right hip bipolar prosthesis in satisfactory position and alignment. No bone destruction, periosteal reaction or periprosthetic lucency. No soft tissue gas. Mild left hip degenerative changes. IMPRESSION: No acute abnormality. Electronically Signed   By: Claudie Revering M.D.   On: 09/05/2017 08:52    Microbiology: Recent Results (from the past 240 hour(s))  Blood Culture (routine x 2)     Status: None (Preliminary result)   Collection Time: 09/23/17  7:50 PM  Result Value Ref Range Status   Specimen Description BLOOD LEFT HAND  Final   Special Requests IN PEDIATRIC BOTTLE Blood Culture adequate volume  Final   Culture NO GROWTH 2 DAYS  Final   Report Status PENDING  Incomplete  Urine culture     Status: None   Collection Time: 09/23/17  8:09 PM  Result Value Ref Range Status   Specimen Description URINE, CATHETERIZED  Final   Special Requests NONE  Final   Culture NO GROWTH  Final   Report Status 09/25/2017 FINAL  Final  Blood Culture (routine x 2)     Status: None (Preliminary result)   Collection Time: 09/23/17  8:23 PM  Result Value Ref Range Status   Specimen Description BLOOD LEFT HAND  Final   Special Requests IN  PEDIATRIC BOTTLE Blood Culture adequate volume  Final   Culture NO GROWTH 2 DAYS  Final   Report Status PENDING  Incomplete  MRSA PCR Screening     Status: None   Collection Time: 09/24/17  1:42 AM  Result Value Ref Range Status   MRSA by PCR NEGATIVE NEGATIVE Final    Comment:        The GeneXpert MRSA Assay (FDA approved for NASAL specimens only), is one component of a comprehensive MRSA colonization surveillance program. It is not intended to diagnose MRSA infection nor to guide or monitor treatment for MRSA infections.      Labs: Basic Metabolic Panel: Recent Labs  Lab 09/23/17 1946 09/23/17 1950 09/24/17 0232 09/25/17 0245 09/26/17 0212  NA 137 133*  --  143 142  K 4.2 4.1  --  2.6* 3.5  CL 101 96*  --  111 113*  CO2  --  20*  --  24 21*  GLUCOSE 350* 360*  --  123* 172*  BUN 23* 21*  --  11 7  CREATININE 1.90* 1.97*  --  1.42* 1.36*  CALCIUM  --  9.2  --  8.3* 8.3*  MG  --   --  1.5*  --   --    Liver Function  Tests: Recent Labs  Lab 09/23/17 1950  AST 55*  ALT 22  ALKPHOS 164*  BILITOT 0.6  PROT 6.9  ALBUMIN 2.5*   No results for input(s): LIPASE, AMYLASE in the last 168 hours. Recent Labs  Lab 09/23/17 2330  AMMONIA 19   CBC: Recent Labs  Lab 09/23/17 1946 09/23/17 1950  WBC  --  9.9  NEUTROABS  --  6.8  HGB 14.6 12.2  HCT 43.0 38.4  MCV  --  100.5*  PLT  --  251   Cardiac Enzymes: No results for input(s): CKTOTAL, CKMB, CKMBINDEX, TROPONINI in the last 168 hours. BNP: BNP (last 3 results) No results for input(s): BNP in the last 8760 hours.  ProBNP (last 3 results) No results for input(s): PROBNP in the last 8760 hours.  CBG: Recent Labs  Lab 09/25/17 1157 09/25/17 1717 09/25/17 2359 09/26/17 0554 09/26/17 1132  GLUCAP 113* 131* 157* 143* 195*       Signed:  Velvet Bathe MD.  [facility] 09/26/2017, 2:08 PM

## 2017-09-27 LAB — GLUCOSE, CAPILLARY
Glucose-Capillary: 166 mg/dL — ABNORMAL HIGH (ref 65–99)
Glucose-Capillary: 107 mg/dL — ABNORMAL HIGH (ref 65–99)
Glucose-Capillary: 180 mg/dL — ABNORMAL HIGH (ref 65–99)

## 2017-09-27 LAB — BASIC METABOLIC PANEL WITH GFR
Anion gap: 8 (ref 5–15)
BUN: <5 mg/dL — ABNORMAL LOW (ref 6–20)
CO2: 24 mmol/L (ref 22–32)
Calcium: 8.3 mg/dL — ABNORMAL LOW (ref 8.9–10.3)
Chloride: 106 mmol/L (ref 101–111)
Creatinine, Ser: 1.22 mg/dL — ABNORMAL HIGH (ref 0.44–1.00)
GFR calc Af Amer: 49 mL/min — ABNORMAL LOW
GFR calc non Af Amer: 42 mL/min — ABNORMAL LOW
Glucose, Bld: 139 mg/dL — ABNORMAL HIGH (ref 65–99)
Potassium: 3 mmol/L — ABNORMAL LOW (ref 3.5–5.1)
Sodium: 138 mmol/L (ref 135–145)

## 2017-09-27 MED ORDER — POTASSIUM CHLORIDE CRYS ER 20 MEQ PO TBCR
40.0000 meq | EXTENDED_RELEASE_TABLET | Freq: Once | ORAL | Status: AC
  Administered 2017-09-27: 40 meq via ORAL
  Filled 2017-09-27: qty 2

## 2017-09-27 NOTE — Progress Notes (Signed)
Discharge instructions given to patient and patients husband, all questions answered at this time.  Pt. VSS with no s/s of distress noted.  Belonging sent home with patients husband.  Patient discharged from facility via [place].

## 2017-09-27 NOTE — Care Management Note (Signed)
Case Management Note  Patient Details  Name: [name] [name] MRN: [id number] Date of Birth: 10/05/1941  Subjective/Objective:  76 y/o F to be discharged today home with resumption of [place] services with [facility]                  Action/Plan:CM will sign off for now but will be available should additional discharge needs arise or disposition change.    Expected Discharge Date:  09/27/17               Expected Discharge Plan:  [place]  In-House Referral:     Discharge planning Services  CM Consult  Post Acute Care Choice:  Home Health Choice offered to:  Patient  DME Arranged:    DME Agency:     HH Arranged:    [place] Agency:     Status of Service:  Completed, signed off  If discussed at [employer], dates discussed:    Additional Comments:  Delrae Sawyers, RN 09/27/2017, 11:11 AM

## 2017-09-27 NOTE — Care Management Note (Signed)
Case Management Note  Patient Details  Name: [name] [name] MRN: [id number] Date of Birth: 09-11-1941  Subjective/Objective:  76 y/o to be discharged home via [facility], which I have contacted. Pt was active with [facility] and services will be resumed after discharge. No further CM needs at this time.                   Action/Plan:CM will sign off for now but will be available should additional discharge needs arise or disposition change.    Expected Discharge Date:  09/27/17               Expected Discharge Plan:  [place]  In-House Referral:     Discharge planning Services  CM Consult  Post Acute Care Choice:  Home Health Choice offered to:  Patient  DME Arranged:    DME Agency:     HH Arranged:    [place] Agency:     Status of Service:  Completed, signed off  If discussed at [employer], dates discussed:    Additional Comments:  Delrae Sawyers, RN 09/27/2017, 11:30 AM

## 2017-09-28 ENCOUNTER — Encounter (HOSPITAL_COMMUNITY): Payer: Self-pay | Admitting: Emergency Medicine

## 2017-09-28 ENCOUNTER — Other Ambulatory Visit: Payer: Self-pay

## 2017-09-28 ENCOUNTER — Emergency Department (HOSPITAL_COMMUNITY): Payer: Medicare Other

## 2017-09-28 ENCOUNTER — Inpatient Hospital Stay (HOSPITAL_COMMUNITY)
Admission: EM | Admit: 2017-09-28 | Discharge: 2017-09-30 | DRG: 871 | Disposition: A | Discharge status: Hospice | Payer: Medicare Other | Attending: Internal Medicine | Admitting: Internal Medicine

## 2017-09-28 ENCOUNTER — Inpatient Hospital Stay (HOSPITAL_COMMUNITY): Payer: Medicare Other

## 2017-09-28 DIAGNOSIS — Z8673 Personal history of transient ischemic attack (TIA), and cerebral infarction without residual deficits: Secondary | ICD-10-CM | POA: Diagnosis not present

## 2017-09-28 DIAGNOSIS — I129 Hypertensive chronic kidney disease with stage 1 through stage 4 chronic kidney disease, or unspecified chronic kidney disease: Secondary | ICD-10-CM | POA: Diagnosis present

## 2017-09-28 DIAGNOSIS — Z7401 Bed confinement status: Secondary | ICD-10-CM | POA: Diagnosis not present

## 2017-09-28 DIAGNOSIS — Z515 Encounter for palliative care: Secondary | ICD-10-CM | POA: Diagnosis not present

## 2017-09-28 DIAGNOSIS — M71051 Abscess of bursa, right hip: Secondary | ICD-10-CM | POA: Diagnosis not present

## 2017-09-28 DIAGNOSIS — R5381 Other malaise: Secondary | ICD-10-CM | POA: Diagnosis present

## 2017-09-28 DIAGNOSIS — E785 Hyperlipidemia, unspecified: Secondary | ICD-10-CM | POA: Diagnosis present

## 2017-09-28 DIAGNOSIS — Z79899 Other long term (current) drug therapy: Secondary | ICD-10-CM

## 2017-09-28 DIAGNOSIS — I69119 Unspecified symptoms and signs involving cognitive functions following nontraumatic intracerebral hemorrhage: Secondary | ICD-10-CM

## 2017-09-28 DIAGNOSIS — G40909 Epilepsy, unspecified, not intractable, without status epilepticus: Secondary | ICD-10-CM | POA: Diagnosis present

## 2017-09-28 DIAGNOSIS — E1122 Type 2 diabetes mellitus with diabetic chronic kidney disease: Secondary | ICD-10-CM | POA: Diagnosis present

## 2017-09-28 DIAGNOSIS — G92 Toxic encephalopathy: Secondary | ICD-10-CM | POA: Diagnosis not present

## 2017-09-28 DIAGNOSIS — G9340 Encephalopathy, unspecified: Secondary | ICD-10-CM | POA: Diagnosis not present

## 2017-09-28 DIAGNOSIS — Z794 Long term (current) use of insulin: Secondary | ICD-10-CM

## 2017-09-28 DIAGNOSIS — R569 Unspecified convulsions: Secondary | ICD-10-CM | POA: Diagnosis not present

## 2017-09-28 DIAGNOSIS — M71059 Abscess of bursa, unspecified hip: Secondary | ICD-10-CM

## 2017-09-28 DIAGNOSIS — Z7982 Long term (current) use of aspirin: Secondary | ICD-10-CM | POA: Diagnosis not present

## 2017-09-28 DIAGNOSIS — Z96641 Presence of right artificial hip joint: Secondary | ICD-10-CM | POA: Diagnosis present

## 2017-09-28 DIAGNOSIS — E118 Type 2 diabetes mellitus with unspecified complications: Secondary | ICD-10-CM

## 2017-09-28 DIAGNOSIS — A419 Sepsis, unspecified organism: Secondary | ICD-10-CM | POA: Diagnosis present

## 2017-09-28 DIAGNOSIS — R9401 Abnormal electroencephalogram [EEG]: Secondary | ICD-10-CM | POA: Diagnosis present

## 2017-09-28 DIAGNOSIS — G9341 Metabolic encephalopathy: Secondary | ICD-10-CM | POA: Diagnosis present

## 2017-09-28 DIAGNOSIS — E876 Hypokalemia: Secondary | ICD-10-CM | POA: Diagnosis present

## 2017-09-28 DIAGNOSIS — R627 Adult failure to thrive: Secondary | ICD-10-CM | POA: Diagnosis present

## 2017-09-28 DIAGNOSIS — N183 Chronic kidney disease, stage 3 unspecified: Secondary | ICD-10-CM | POA: Diagnosis present

## 2017-09-28 DIAGNOSIS — R4182 Altered mental status, unspecified: Secondary | ICD-10-CM

## 2017-09-28 DIAGNOSIS — Z6821 Body mass index (BMI) 21.0-21.9, adult: Secondary | ICD-10-CM | POA: Diagnosis not present

## 2017-09-28 DIAGNOSIS — D631 Anemia in chronic kidney disease: Secondary | ICD-10-CM | POA: Diagnosis present

## 2017-09-28 DIAGNOSIS — K219 Gastro-esophageal reflux disease without esophagitis: Secondary | ICD-10-CM | POA: Diagnosis present

## 2017-09-28 DIAGNOSIS — L02415 Cutaneous abscess of right lower limb: Secondary | ICD-10-CM

## 2017-09-28 DIAGNOSIS — Z66 Do not resuscitate: Secondary | ICD-10-CM | POA: Diagnosis present

## 2017-09-28 DIAGNOSIS — R509 Fever, unspecified: Secondary | ICD-10-CM | POA: Diagnosis present

## 2017-09-28 LAB — CBC WITH DIFFERENTIAL/PLATELET
BASOS ABS: 0 10*3/uL (ref 0.0–0.1)
BASOS PCT: 0 %
EOS ABS: 0 10*3/uL (ref 0.0–0.7)
EOS PCT: 0 %
HCT: 40.2 % (ref 36.0–46.0)
Hemoglobin: 12.9 g/dL (ref 12.0–15.0)
Lymphocytes Relative: 29 %
Lymphs Abs: 3 10*3/uL (ref 0.7–4.0)
MCH: 31.3 pg (ref 26.0–34.0)
MCHC: 32.1 g/dL (ref 30.0–36.0)
MCV: 97.6 fL (ref 78.0–100.0)
Monocytes Absolute: 0.7 10*3/uL (ref 0.1–1.0)
Monocytes Relative: 7 %
NEUTROS PCT: 64 %
Neutro Abs: 6.6 10*3/uL (ref 1.7–7.7)
PLATELETS: 212 10*3/uL (ref 150–400)
RBC: 4.12 MIL/uL (ref 3.87–5.11)
RDW: 16 % — ABNORMAL HIGH (ref 11.5–15.5)
WBC: 10.4 10*3/uL (ref 4.0–10.5)

## 2017-09-28 LAB — COMPREHENSIVE METABOLIC PANEL
ALT: 17 U/L (ref 14–54)
AST: 31 U/L (ref 15–41)
Albumin: 2.7 g/dL — ABNORMAL LOW (ref 3.5–5.0)
Alkaline Phosphatase: 130 U/L — ABNORMAL HIGH (ref 38–126)
Anion gap: 9 (ref 5–15)
BILIRUBIN TOTAL: 0.5 mg/dL (ref 0.3–1.2)
CO2: 26 mmol/L (ref 22–32)
CREATININE: 1.3 mg/dL — AB (ref 0.44–1.00)
Calcium: 8.7 mg/dL — ABNORMAL LOW (ref 8.9–10.3)
Chloride: 104 mmol/L (ref 101–111)
GFR, EST AFRICAN AMERICAN: 45 mL/min — AB (ref >60)
GFR, EST NON AFRICAN AMERICAN: 39 mL/min — AB (ref >60)
Glucose, Bld: 212 mg/dL — ABNORMAL HIGH (ref 65–99)
Potassium: 3.8 mmol/L (ref 3.5–5.1)
Sodium: 139 mmol/L (ref 135–145)
TOTAL PROTEIN: 7.2 g/dL (ref 6.5–8.1)

## 2017-09-28 LAB — CBG MONITORING, ED: GLUCOSE-CAPILLARY: 185 mg/dL — AB (ref 65–99)

## 2017-09-28 LAB — CK: CK TOTAL: 26 U/L — AB (ref 38–234)

## 2017-09-28 LAB — URINALYSIS, ROUTINE W REFLEX MICROSCOPIC
BILIRUBIN URINE: NEGATIVE
GLUCOSE, UA: 50 mg/dL — AB
KETONES UR: 5 mg/dL — AB
NITRITE: NEGATIVE
PROTEIN: NEGATIVE mg/dL
Specific Gravity, Urine: 1.01 (ref 1.005–1.030)
pH: 6 (ref 5.0–8.0)

## 2017-09-28 LAB — CULTURE, BLOOD (ROUTINE X 2)
CULTURE: NO GROWTH
Culture: NO GROWTH
Special Requests: ADEQUATE
Special Requests: ADEQUATE

## 2017-09-28 LAB — I-STAT CG4 LACTIC ACID, ED
LACTIC ACID, VENOUS: 2.26 mmol/L — AB (ref 0.5–1.9)
Lactic Acid, Venous: 1.46 mmol/L (ref 0.5–1.9)

## 2017-09-28 MED ORDER — MAGNESIUM OXIDE 400 (241.3 MG) MG PO TABS
400.0000 mg | ORAL_TABLET | Freq: Two times a day (BID) | ORAL | Status: DC
  Filled 2017-09-28 (×2): qty 1

## 2017-09-28 MED ORDER — SODIUM CHLORIDE 0.9 % IV SOLN
INTRAVENOUS | Status: DC
  Administered 2017-09-29 – 2017-09-30 (×3): via INTRAVENOUS

## 2017-09-28 MED ORDER — PIPERACILLIN-TAZOBACTAM 3.375 G IVPB 30 MIN
3.3750 g | Freq: Once | INTRAVENOUS | Status: AC
  Administered 2017-09-28: 3.375 g via INTRAVENOUS
  Filled 2017-09-28: qty 50

## 2017-09-28 MED ORDER — ASPIRIN EC 325 MG PO TBEC: 325.0000 mg | DELAYED_RELEASE_TABLET | Freq: Every day | ORAL | Status: DC

## 2017-09-28 MED ORDER — HEPARIN SODIUM (PORCINE) 5000 UNIT/ML IJ SOLN
5000.0000 [IU] | Freq: Three times a day (TID) | INTRAMUSCULAR | Status: DC
  Administered 2017-09-29 – 2017-09-30 (×4): 5000 [IU] via SUBCUTANEOUS
  Filled 2017-09-28 (×4): qty 1

## 2017-09-28 MED ORDER — ONDANSETRON HCL 4 MG PO TABS: 4.0000 mg | ORAL_TABLET | Freq: Four times a day (QID) | ORAL | Status: DC | PRN

## 2017-09-28 MED ORDER — VANCOMYCIN HCL IN DEXTROSE 1-5 GM/200ML-% IV SOLN
1000.0000 mg | Freq: Once | INTRAVENOUS | Status: AC
  Administered 2017-09-28: 1000 mg via INTRAVENOUS
  Filled 2017-09-28: qty 200

## 2017-09-28 MED ORDER — INSULIN ASPART 100 UNIT/ML ~~LOC~~ SOLN: 0.0000 [IU] | Freq: Every day | SUBCUTANEOUS | Status: DC

## 2017-09-28 MED ORDER — ATORVASTATIN CALCIUM 10 MG PO TABS
10.0000 mg | ORAL_TABLET | Freq: Every day | ORAL | Status: DC
  Filled 2017-09-28: qty 1

## 2017-09-28 MED ORDER — PIPERACILLIN-TAZOBACTAM 3.375 G IVPB
3.3750 g | Freq: Three times a day (TID) | INTRAVENOUS | Status: DC
  Administered 2017-09-28 – 2017-09-29 (×3): 3.375 g via INTRAVENOUS
  Filled 2017-09-28 (×3): qty 50

## 2017-09-28 MED ORDER — ACETAMINOPHEN 325 MG PO TABS: 650.0000 mg | ORAL_TABLET | Freq: Four times a day (QID) | ORAL | Status: DC | PRN

## 2017-09-28 MED ORDER — PANTOPRAZOLE SODIUM 40 MG PO TBEC: 40.0000 mg | DELAYED_RELEASE_TABLET | Freq: Every day | ORAL | Status: DC

## 2017-09-28 MED ORDER — ONDANSETRON HCL 4 MG/2ML IJ SOLN: 4.0000 mg | Freq: Four times a day (QID) | INTRAMUSCULAR | Status: DC | PRN

## 2017-09-28 MED ORDER — ACETAMINOPHEN 650 MG RE SUPP
650.0000 mg | Freq: Four times a day (QID) | RECTAL | Status: DC | PRN
  Administered 2017-09-28 – 2017-09-29 (×2): 650 mg via RECTAL
  Filled 2017-09-28 (×3): qty 1

## 2017-09-28 MED ORDER — SODIUM CHLORIDE 0.9 % IV SOLN
500.0000 mg | Freq: Once | INTRAVENOUS | Status: AC
  Administered 2017-09-28: 500 mg via INTRAVENOUS

## 2017-09-28 MED ORDER — VANCOMYCIN HCL IN DEXTROSE 750-5 MG/150ML-% IV SOLN
750.0000 mg | INTRAVENOUS | Status: DC
  Filled 2017-09-28 (×2): qty 150

## 2017-09-28 MED ORDER — SODIUM CHLORIDE 0.9 % IV BOLUS (SEPSIS)
1000.0000 mL | Freq: Once | INTRAVENOUS | Status: AC
  Administered 2017-09-28: 1000 mL via INTRAVENOUS

## 2017-09-28 MED ORDER — ACETAMINOPHEN 325 MG RE SUPP
325.0000 mg | Freq: Once | RECTAL | Status: AC
  Administered 2017-09-28: 325 mg via RECTAL
  Filled 2017-09-28: qty 1

## 2017-09-28 MED ORDER — INSULIN ASPART 100 UNIT/ML ~~LOC~~ SOLN
0.0000 [IU] | Freq: Three times a day (TID) | SUBCUTANEOUS | Status: DC
  Administered 2017-09-29: 2 [IU] via SUBCUTANEOUS
  Administered 2017-09-29 – 2017-09-30 (×2): 1 [IU] via SUBCUTANEOUS
  Filled 2017-09-28: qty 1

## 2017-09-28 MED ORDER — METOPROLOL SUCCINATE ER 25 MG PO TB24
25.0000 mg | ORAL_TABLET | Freq: Every day | ORAL | Status: DC
  Filled 2017-09-28: qty 1

## 2017-09-28 NOTE — ED Notes (Signed)
Neurology rounding at bedside

## 2017-09-28 NOTE — ED Notes (Signed)
In & out cathed pt, pt had no output. Purewick was placed on pt, nurse and MD was notified.

## 2017-09-28 NOTE — ED Triage Notes (Signed)
Patient arrived to ED from home via [facility]. EMS reports:  Patient seen yesterday in ED. Diagnosed with encephalopathy. Discharged to home. Spouse reports that patient able to converse and was normal at 2200 bedtime. This morning, spouse reports difficulty waking patient, and that she will not speak.  BP 170/90, Pulse 128, Resp 22, 94% on room air. CBG 187. DNR & MOST. 20 gauge L wrist.

## 2017-09-28 NOTE — Progress Notes (Signed)
Pharmacy Antibiotic Note  [name] [name] is a 76 y.o. female admitted on 09/28/2017 with sepsis.  Pharmacy has been consulted for Zosyn and vancomycin dosing. Patient was recently discharged from [facility] after a brief course of Zosyn and Levaquin. WBC wnl. LA 2.26. SCr 1.3, CrCl ~ 30-35 mL/min.   Plan: -Zosyn 3.375 gm IV Q 8 hours (EI infusion) -Vancomycin 1 gm IV once, then vancomycin 750 mg IV Q 24 hours  -Monitor CBC, renal fx, cultures and clinical progress -VT at SS   Height: 5\' 4"  (162.6 cm) Weight: 127 lb (57.6 kg) IBW/kg (Calculated) : 54.7  Temp (24hrs), Avg:100.5 F (38.1 C), Min:98.7 F (37.1 C), Max:102 F (38.9 C)  Recent Labs  Lab 09/23/17 1946 09/23/17 1947 09/23/17 1950 09/23/17 2203 09/25/17 0245 09/26/17 0212 09/27/17 0230  WBC  --   --  9.9  --   --   --   --   CREATININE 1.90*  --  1.97*  --  1.42* 1.36* 1.22*  LATICACIDVEN  --  7.88*  --  1.42  --   --   --     Estimated Creatinine Clearance: 33.9 mL/min (A) (by C-G formula based on SCr of 1.22 mg/dL (H)).    Allergies  Allergen Reactions  . Barbiturates Other (See Comments)    Restlessness  . Other Other (See Comments)    "Strong meds" ("like sleeping pills") = Restlessness  . Latex Itching    Antimicrobials this admission: Zosyn 11/26 >> Vanc 11/26 >>   Dose adjustments this admission: None   Microbiology results: 11/26 BCx:    Thank you for allowing pharmacy to be a part of this patient's care.  Albertina Parr, PharmD., BCPS Clinical Pharmacist Pager [phone]

## 2017-09-28 NOTE — Progress Notes (Signed)
EEG complete - results pending 

## 2017-09-28 NOTE — H&P (Addendum)
History and Physical    Jin Shockley GGY:[id number] DOB: [birth year] DOA: 09/28/2017  PCP: [name], MD Patient coming from: home  Chief Complaint: acute encephalopathy  HPI: [name] [name] is a 76 y.o. female with medical history significant of C kidney, GERD, CVA, diabetes, seizures, hypertension, hyperlipidemia, bacteremia, hip fracture.  Level 5 caveat applies as pt unable to provide history. Information obtained from pts husband/caretaker and EDP and chart. Patient was discharged the day prior to readmission after 3 day treatment for acute encephalopathy of unknown etiology. Eventual Dx was thought to be due to breakthrough seizure and dehydration. Possible infectious processes such as uti and pneumonia were also treated w/ Levaquin. Pt was reportedly near baseline at time of discharge on the 24th. Pt is essentially bed bound and requires significant assistance to go from bed to chair/couch and for basic toileting needs. Pt was reportedly conversive and tolerating oral intake the day prior to admission and went to bed feelign well. Husband reports checking on pt at 04:30 and states she was still doing well. However around 09:00 when husband checked on pt again she was unresponsive. EMS was called and pt brought to the ED. Of note pt w/ several sgnificant admissions over the past 3 mo including the following:  06/05/2017-06/11/2017 for sepsis from Escherichia coli bacteremia 06/11/2017-06/15/2017 for weakness and inability to care for self due to continued effects from sepsis. 07/07/2017-07/30/2017 for close right hip fracture 08/26/2017-09/11/2017 for treatment of acute encephalopathy thought to be secondary to seizures, sepsis, and significant metabolic derangements. 09/23/2017-09/26/2017 as above  ED Course: Sepsis protocol initiated patient on broad-spectrum antibiotics as well as given fluids. Neurology consult. Patient loaded on Keppra to prevent possible seizures. EEG ordered and  obtained.  Review of Systems: As per HPI otherwise all other systems reviewed and are negative  Ambulatory Status: Requires significant assistance to move from bed to chair and toilet and back. Essentially nonambulatory  Past Medical History:  Diagnosis Date  . Anemia   . Arthritis    "a touch in my hands" (11/17/2016)  . Chronic back pain   . Chronic kidney disease (CKD), active medical management without dialysis, stage 3 (moderate) ([place])   . Depression   . GERD (gastroesophageal reflux disease)   . High cholesterol   . Hyperlipidemia   . Hypertension   . Seizures ([place])    last seizure 2014 (11/17/2016)  . Stroke [facility]) 2013   Secondary to acute right posterior temporo-occipital intra-axial hemorrhage  . Type II diabetes mellitus ([place])     Past Surgical History:  Procedure Laterality Date  . CATARACT EXTRACTION W/ INTRAOCULAR LENS IMPLANT Bilateral   . COLONOSCOPY    . DILATION AND CURETTAGE OF UTERUS  X 3  . 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 I&D EXTREMITY Right 09/06/2017   Procedure: IRRIGATION AND DEBRIDEMENT HIP;  Surgeon: Mcarthur Rossetti, MD;  Location: [facility];  Service: Orthopedics;  Laterality: Right;  . TOTAL HIP ARTHROPLASTY Right 07/28/2017   Procedure: TOTAL HIP ARTHROPLASTY ANTERIOR APPROACH;  Surgeon: Mcarthur Rossetti, MD;  Location: [facility];  Service: Orthopedics;  Laterality: Right;    Social History   Socioeconomic History  . Marital status: Married    Spouse name: Not on file  . Number of children: Not on file  . Years of education: Not on file  . Highest education  level: Not on file  Social Needs  . Financial resource strain: Not on file  . Food insecurity - worry: Not on file  . Food insecurity - inability: Not on file  . Transportation needs - medical: Not on file  . Transportation needs -  non-medical: Not on file  Occupational History  . Occupation: Retired  Tobacco Use  . Smoking status: Never Smoker  . Smokeless tobacco: Never Used  Substance and Sexual Activity  . Alcohol use: No    Comment: patient drinks caffeinated drinks.   . Drug use: No  . Sexual activity: No  Other Topics Concern  . Not on file  Social History Narrative   Patient lives at home with her husband and daughter and has a high school education.     Allergies  Allergen Reactions  . Barbiturates Other (See Comments)    Restlessness  . Other Other (See Comments)    "Strong meds" ("like sleeping pills") = Restlessness  . Latex Itching    Family History  Problem Relation Age of Onset  . Cancer Mother   . Colon cancer Mother   . Liver disease Mother   . Breast cancer Mother   . Cancer Father   . Diabetes Paternal Grandmother   . Breast cancer Maternal Aunt   . Breast cancer Maternal Aunt   . Esophageal cancer Neg Hx   . Rectal cancer Neg Hx   . Stomach cancer Neg Hx       Prior to Admission medications   Medication Sig Start Date End Date Taking? Authorizing Provider  aspirin EC 325 MG EC tablet Take 1 tablet (325 mg total) by mouth daily with breakfast. 07/29/17  Yes Mcarthur Rossetti, MD  atorvastatin (LIPITOR) 10 MG tablet Take 10 mg by mouth daily.  02/11/13  Yes [provider]  fludrocortisone (FLORINEF) 0.1 MG tablet Take 0.1 mg by mouth daily. AND MONITOR STANDING B/P DAILY 08/25/17  Yes [provider]  LEVEMIR FLEXTOUCH 100 UNIT/ML Pen INJECT 10 UNITS SUBCUTANEOUSLY DAILY AT NIGHT Patient taking differently: Inject 8 Units into the skin at bedtime as needed (as needed for BGL of at least 100 or greater).  06/15/17  Yes Reyne Dumas, MD  levETIRAcetam (KEPPRA) 100 MG/ML solution Take 5 mLs (500 mg total) 2 (two) times daily by mouth. 09/11/17  Yes Kayleen Memos, DO  Magnesium 500 MG TABS Take 1 tablet (500 mg total) by mouth 2 (two) times daily. 06/11/17  Yes  Hongalgi, Lenis Dickinson, MD  metoprolol succinate (TOPROL-XL) 25 MG 24 hr tablet Take 1 tablet (25 mg total) by mouth daily. 06/12/17  Yes Hongalgi, Lenis Dickinson, MD  Multiple Vitamin (MULTIVITAMIN) capsule Take 1 capsule by mouth daily.   Yes [provider]  NOVOLOG FLEXPEN 100 UNIT/ML SOPN FlexPen Inject 4-9 Units into the skin See admin instructions. Three times a day with meals per sliding scale: 4 units with breakfast then 4 units with lunch (but increase to 5 units if BGL is >200) and 8 units with supper (but increase to 9 units if BGL is >200) 07/29/13  Yes [provider]  omeprazole (PRILOSEC) 20 MG capsule Take 20 mg by mouth every morning.  03/17/15  Yes [provider]  ondansetron (ZOFRAN ODT) 4 MG disintegrating tablet Take 1 tablet (4 mg total) by mouth every 8 (eight) hours as needed for nausea or vomiting. 08/10/17  Yes Mcarthur Rossetti, MD  vitamin C (ASCORBIC ACID) 500 MG tablet Take 500 mg  by mouth daily.   Yes [provider]  ZINC OXIDE, TOPICAL, 10 % CREA Apply 1 application topically See admin instructions. TWO TO THREE TIMES A DAY TO IRRITATED AREAS OF BUTTOCKS   Yes [provider]    Physical Exam: Vitals:   09/28/17 1630 09/28/17 1715 09/28/17 1745 09/28/17 1830  BP: (!) 152/71 (!) 153/84 (!) 163/80 (!) 150/83  Pulse: (!) 118 (!) 121  (!) 118  Resp:      Temp:      TempSrc:      SpO2: 94% 97%  93%  Weight:      Height:         General: Elderly, frail and cachectic appearing, lying in bed. Eyes:  PERRL, EOMI, normal lids, iris ENT: Dry mucous membranes, poor dentition. Neck:  no LAD, masses or thyromegaly Cardiovascular: faint heart sounds.  RRR, trace LE edema.  Respiratory:  CTA bilaterally, no w/r/r. Normal respiratory effort. Abdomen:  soft, ntnd, NABS Skin: poor skin turgor, no rash Musculoskeletal: Unable to fully assess due to altered mental state the patient being up to Toradol. Healing incision site noted at  right hip from previous right hip fracture. No bony abnormalities appreciated. Psychiatric: Noncommunicative. Does not follow commands. Occasionally will track with eyes. Neurologic: Unable to fully assess due to mental status. Sensation appears to be intact as patient will withdraw to pain. No facial asymmetry.   Labs on Admission: I have personally reviewed following labs and imaging studies  CBC: Recent Labs  Lab 09/23/17 1946 09/23/17 1950 09/28/17 1140  WBC  --  9.9 10.4  NEUTROABS  --  6.8 6.6  HGB 14.6 12.2 12.9  HCT 43.0 38.4 40.2  MCV  --  100.5* 97.6  PLT  --  251 568   Basic Metabolic Panel: Recent Labs  Lab 09/23/17 1950 09/24/17 0232 09/25/17 0245 09/26/17 0212 09/27/17 0230 09/28/17 1140  NA 133*  --  143 142 138 139  K 4.1  --  2.6* 3.5 3.0* 3.8  CL 96*  --  111 113* 106 104  CO2 20*  --  24 21* 24 26  GLUCOSE 360*  --  123* 172* 139* 212*  BUN 21*  --  11 7 <5* <5*  CREATININE 1.97*  --  1.42* 1.36* 1.22* 1.30*  CALCIUM 9.2  --  8.3* 8.3* 8.3* 8.7*  MG  --  1.5*  --   --   --   --    GFR: Estimated Creatinine Clearance: 31.8 mL/min (A) (by C-G formula based on SCr of 1.3 mg/dL (H)). Liver Function Tests: Recent Labs  Lab 09/23/17 1950 09/28/17 1140  AST 55* 31  ALT 22 17  ALKPHOS 164* 130*  BILITOT 0.6 0.5  PROT 6.9 7.2  ALBUMIN 2.5* 2.7*   No results for input(s): LIPASE, AMYLASE in the last 168 hours. Recent Labs  Lab 09/23/17 2330  AMMONIA 19   Coagulation Profile: No results for input(s): INR, PROTIME in the last 168 hours. Cardiac Enzymes: No results for input(s): CKTOTAL, CKMB, CKMBINDEX, TROPONINI in the last 168 hours. BNP (last 3 results) No results for input(s): PROBNP in the last 8760 hours. HbA1C: No results for input(s): HGBA1C in the last 72 hours. CBG: Recent Labs  Lab 09/26/17 1732 09/26/17 2326 09/27/17 0521 09/27/17 0721 09/27/17 1121  GLUCAP 180* 144* 166* 107* 180*   Lipid Profile: No results for  input(s): CHOL, HDL, LDLCALC, TRIG, CHOLHDL, LDLDIRECT in the last 72 hours. Thyroid Function Tests:  No results for input(s): TSH, T4TOTAL, FREET4, T3FREE, THYROIDAB in the last 72 hours. Anemia Panel: No results for input(s): VITAMINB12, FOLATE, FERRITIN, TIBC, IRON, RETICCTPCT in the last 72 hours. Urine analysis:    Component Value Date/Time   COLORURINE STRAW (A) 09/28/2017 1727   APPEARANCEUR HAZY (A) 09/28/2017 1727   LABSPEC 1.010 09/28/2017 1727   PHURINE 6.0 09/28/2017 1727   GLUCOSEU 50 (A) 09/28/2017 1727   HGBUR SMALL (A) 09/28/2017 1727   BILIRUBINUR NEGATIVE 09/28/2017 1727   BILIRUBINUR neg 08/16/2012 1531   KETONESUR 5 (A) 09/28/2017 1727   PROTEINUR NEGATIVE 09/28/2017 1727   UROBILINOGEN 0.2 06/25/2013 1002   NITRITE NEGATIVE 09/28/2017 1727   LEUKOCYTESUR LARGE (A) 09/28/2017 1727    Creatinine Clearance: Estimated Creatinine Clearance: 31.8 mL/min (A) (by C-G formula based on SCr of 1.3 mg/dL (H)).  Sepsis Labs: @LABRCNTIP (procalcitonin:4,lacticidven:4) ) Recent Results (from the past 240 hour(s))  Blood Culture (routine x 2)     Status: None   Collection Time: 09/23/17  7:50 PM  Result Value Ref Range Status   Specimen Description BLOOD LEFT HAND  Final   Special Requests IN PEDIATRIC BOTTLE Blood Culture adequate volume  Final   Culture NO GROWTH 5 DAYS  Final   Report Status 09/28/2017 FINAL  Final  Urine culture     Status: None   Collection Time: 09/23/17  8:09 PM  Result Value Ref Range Status   Specimen Description URINE, CATHETERIZED  Final   Special Requests NONE  Final   Culture NO GROWTH  Final   Report Status 09/25/2017 FINAL  Final  Blood Culture (routine x 2)     Status: None   Collection Time: 09/23/17  8:23 PM  Result Value Ref Range Status   Specimen Description BLOOD LEFT HAND  Final   Special Requests IN PEDIATRIC BOTTLE Blood Culture adequate volume  Final   Culture NO GROWTH 5 DAYS  Final   Report Status 09/28/2017 FINAL   Final  MRSA PCR Screening     Status: None   Collection Time: 09/24/17  1:42 AM  Result Value Ref Range Status   MRSA by PCR NEGATIVE NEGATIVE Final    Comment:        The GeneXpert MRSA Assay (FDA approved for NASAL specimens only), is one component of a comprehensive MRSA colonization surveillance program. It is not intended to diagnose MRSA infection nor to guide or monitor treatment for MRSA infections.      Radiological Exams on Admission: Ct Head Wo Contrast  Result Date: 09/28/2017 CLINICAL DATA:  Altered mental status. EXAM: CT HEAD WITHOUT CONTRAST TECHNIQUE: Contiguous axial images were obtained from the base of the skull through the vertex without intravenous contrast. COMPARISON:  MRI brain dated September 24, 2017. CT head dated September 23, 2017. FINDINGS: Brain: No evidence of acute infarction, hemorrhage, hydrocephalus, extra-axial collection or mass lesion/mass effect. Unchanged diffuse cerebral atrophy and ex vacuo dilatation of the ventricles. 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 Vascular: No hyperdense vessel or unexpected calcification. Skull: Normal. Negative for fracture or focal lesion. Sinuses/Orbits: No acute finding. Other: None. IMPRESSION: 1. No acute intracranial abnormality. Stable cerebral atrophy and chronic microvascular ischemic white matter disease. Electronically Signed   By: Titus Dubin M.D.   On: 09/28/2017 13:56   Dg Chest Port 1 View  Result Date: 09/28/2017 CLINICAL DATA:  Encephalopathy, was conversing last night at 2200 hours, not speaking this morning, difficulty waking patient, shortness of breath, history hypertension, seizures, type  II diabetes mellitus EXAM: PORTABLE CHEST 1 VIEW COMPARISON:  Portable exam 1108 hours compared to 09/23/2017 FINDINGS: Normal heart size, mediastinal contours, and pulmonary vascularity. Atherosclerotic calcification aorta. Lungs mildly hyperinflated with biapical scarring. No acute  infiltrate, pleural effusion or pneumothorax. Diffuse osseous demineralization. IMPRESSION: Emphysematous changes with biapical scarring. No acute abnormalities. Electronically Signed   By: Lavonia Dana M.D.   On: 09/28/2017 11:30    EKG: Independently reviewed. Sinus. 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 Pt left on from the hospital 2 days prior to admission on levaquin. UA abnormal but not overly convincing. CXR w/o definitive acute process noted. Febrile w/ lactic acid 2.26, encephalopathy, tachycardia, fever, tachypnea. May actually be completely from recurrent sezures. Previous admission cultures were all negative. Pt also w/ similar presentation in the past and had LP performed which was also negative.  - Continue sepsis protocol until cultures return (blood/urine) - Vanc/Zosyn  Seizures: questionable breakthrough seizures. Neuro following. CT head w/o acute process noted.  - DC levaquin - f/u Neuro recs - Keppra load w/ continued home keppra - EEG - CK in pm and am  Social: Pt w/ rapid overall decline since initial illness w/ bacteremia in August of 2018. See HPI for list of admissions. Concern for overall prognosis as pt not likely to ever regain any true functional status. Pt is a limited code. Husband is elderly and her primary care giver. He cares about the pt deeply and truly wants what's best for the pt.  - Palliative care consult in am for GOC.   DM: - hold levemir until oral intake returns - SSI  HTN: - continue Metop  Adrenal: Florinef added to medication list as a historical med on 08-25-17. Unsure of why this was added.  - cortisol studies - consider  resuming florinef in am. No need for it on day of admission. Clarify if being given in outpt setting by husband as he was unavailable at time of placement of orders.   CKD: Cr 1.3. At baseline - IVF - BMP in am  H/O CVA:  - continue ASA, lipitor   DVT prophylaxis: Hep  Code Status: Partial - No CPR but OK for intubation. ocnfirmed w/ husband at time of admission on 09-28-17. Family Communication: husband  Disposition Plan: pending improvement in condition and [place] discussions w/ palliative  Consults called: palliative  Admission status: inpt. SDU    Waldemar Dickens MD [facility]  If 7PM-7AM, please contact night-coverage [email or web address] Password [facility]  09/28/2017, 6:43 PM    Critical Care Time This patient is critically ill requiring frequent monitoring and support due to their life/organ threatening condition. This care involves a high complexity of medical decision making. Greater than 70 minutes spent in direct patient care and coordination.

## 2017-09-28 NOTE — Procedures (Signed)
ELECTROENCEPHALOGRAM REPORT  Date of Study: 09/28/2017  Patient's Name: [name] [name] MRN: [id number] Date of Birth: October 07, 1941  Referring Provider: Amie Portland, MD  Clinical History: 76 y.o. female past medical history of right parieto-occipital ICH, seizures, chronic kidney disease stage III, insulin-dependent diabetes, hypertension, recent hip surgery recently seen for concern for prolonged seizure activity in the setting of an underlying infectious process (UTI)  Medications: acetaminophen (TYLENOL) suppository 325 mg  piperacillin-tazobactam (ZOSYN) IVPB 3.375 g  vancomycin (VANCOCIN) IVPB 750 mg/150 ml premix   Technical Summary: A multichannel digital EEG recording measured by the international 10-20 system with electrodes applied with paste and impedances below 5000 ohms performed as portable with EKG monitoring in an awake and drowsy patient.  Hyperventilation and photic stimulation were not performed.  The digital EEG was referentially recorded, reformatted, and digitally filtered in a variety of bipolar and referential montages for optimal display.   Description: The patient is awake and drowsy during the recording.  During maximal wakefulness, there is a symmetric, medium voltage 5 Hz posterior dominant rhythm that attenuates with eye opening. This is admixed with diffuse 4-5 Hz theta and 2-3 Hz delta slowing of the waking background.  Stage 2 sleep is not seen.  There were no epileptiform discharges or electrographic seizures seen.  Patient was tremulous during the study.  EKG lead revealed tachycardia  Impression: This awake and drowsy EEG is abnormal due to diffuse slowing of the waking background.  Clinical Correlation of the above findings indicates diffuse cerebral dysfunction that is non-specific in etiology and can be seen with hypoxic/ischemic injury, toxic/metabolic encephalopathies, neurodegenerative disorders, or medication effect.  The absence of epileptiform  discharges does not rule out a clinical diagnosis of epilepsy.  Clinical correlation is advised.   Metta Clines, DO

## 2017-09-28 NOTE — Consult Note (Signed)
Neurology Consultation  Reason for Consult: Altered mental status Referring Physician: Dr. Thomasene Lot  CC: Altered mental status, concern for seizures.  History is obtained from: Patient husband, chart  HPI: [name] [name] is a 76 y.o. female past medical history of right parieto-occipital ICH, seizures, chronic kidney disease stage III, insulin-dependent diabetes, hypertension, recent hip surgery recently seen for concern for prolonged seizure activity in the setting of an underlying infectious process (UTI) who was also on prolonged LTM EEG monitoring which did not reveal any evidence of status in late October 2018, presenting today for evaluation of altered mental status noted by the patient's husband. According to the husband, since she woke this morning, she has been unresponsive, not talking, and appears not to be like her normal self. She was evaluated in the emergency room, and regular labs were drawn, which are revealing of sepsis. She was having some jerking movements of the left side of her body, and a neurological consultation was obtained for concern for seizures. Patient had noncontrast CT of the head on 09/23/2017 and MRI of the brain on 09/24/2017 which were unrevealing for any acute process. She was recently seen and evaluated for acute encephalopathy, again possibly secondary to an underlying UTI. Also of note, she was given Levaquin for treatment of UTI recently, after having been on broad-spectrum antibiotic coverage prior to narrowing it to Levaquin.   TPA not given-less likely stroke and also outside the window as last known normal was last night.  QQV:ZDGLOV to obtain due to altered mental status.   Past Medical History:  Diagnosis Date  . Anemia   . Arthritis    "a touch in my hands" (11/17/2016)  . Chronic back pain   . Chronic kidney disease (CKD), active medical management without dialysis, stage 3 (moderate) ([place])   . Depression   . GERD (gastroesophageal reflux  disease)   . High cholesterol   . Hyperlipidemia   . Hypertension   . Seizures ([place])    last seizure 2014 (11/17/2016)  . Stroke [facility]) 2013   Secondary to acute right posterior temporo-occipital intra-axial hemorrhage  . Type II diabetes mellitus (HCC)     Family History  Problem Relation Age of Onset  . Cancer Mother   . Colon cancer Mother   . Liver disease Mother   . Breast cancer Mother   . Cancer Father   . Diabetes Paternal Grandmother   . Breast cancer Maternal Aunt   . Breast cancer Maternal Aunt   . Esophageal cancer Neg Hx   . Rectal cancer Neg Hx   . Stomach cancer Neg Hx     Social History:   reports that  has never smoked. she has never used smokeless tobacco. She reports that she does not drink alcohol or use drugs.  Medications  Current Facility-Administered Medications:  .  piperacillin-tazobactam (ZOSYN) IVPB 3.375 g, 3.375 g, Intravenous, Once, Mackuen, Courteney Lyn, MD, Last Rate: 100 mL/hr at 09/28/17 1150, 3.375 g at 09/28/17 1150 .  sodium chloride 0.9 % bolus 1,000 mL, 1,000 mL, Intravenous, Once, Mackuen, Courteney Lyn, MD, Last Rate: 2,000 mL/hr at 09/28/17 1150, 1,000 mL at 09/28/17 1150 .  vancomycin (VANCOCIN) IVPB 1000 mg/200 mL premix, 1,000 mg, Intravenous, Once, Mackuen, Courteney Lyn, MD, Last Rate: 200 mL/hr at 09/28/17 1150, 1,000 mg at 09/28/17 1150  Current Outpatient Medications:  .  aspirin EC 325 MG EC tablet, Take 1 tablet (325 mg total) by mouth daily with breakfast., Disp: 30 tablet, Rfl: 0 .  atorvastatin (LIPITOR) 10 MG tablet, Take 10 mg by mouth daily. , Disp: , Rfl:  .  fludrocortisone (FLORINEF) 0.1 MG tablet, Take 0.1 mg by mouth daily. AND MONITOR STANDING B/P DAILY, Disp: , Rfl: 2 .  LEVEMIR FLEXTOUCH 100 UNIT/ML Pen, INJECT 10 UNITS SUBCUTANEOUSLY DAILY AT NIGHT (Patient taking differently: Inject 8 Units into the skin at bedtime as needed (as needed for BGL of at least 100 or greater). ), Disp: 15 mL, Rfl: 2 .   levETIRAcetam (KEPPRA) 100 MG/ML solution, Take 5 mLs (500 mg total) 2 (two) times daily by mouth., Disp: 473 mL, Rfl: 0 .  Magnesium 500 MG TABS, Take 1 tablet (500 mg total) by mouth 2 (two) times daily., Disp: , Rfl:  .  metoprolol succinate (TOPROL-XL) 25 MG 24 hr tablet, Take 1 tablet (25 mg total) by mouth daily., Disp: 30 tablet, Rfl: 0 .  Multiple Vitamin (MULTIVITAMIN) capsule, Take 1 capsule by mouth daily., Disp: , Rfl:  .  NOVOLOG FLEXPEN 100 UNIT/ML SOPN FlexPen, Inject 4-9 Units into the skin See admin instructions. Three times a day with meals per sliding scale: 4 units with breakfast then 4 units with lunch (but increase to 5 units if BGL is >200) and 8 units with supper (but increase to 9 units if BGL is >200), Disp: , Rfl:  .  omeprazole (PRILOSEC) 20 MG capsule, Take 20 mg by mouth every morning. , Disp: , Rfl: 5 .  ondansetron (ZOFRAN ODT) 4 MG disintegrating tablet, Take 1 tablet (4 mg total) by mouth every 8 (eight) hours as needed for nausea or vomiting. (Patient not taking: Reported on 09/23/2017), Disp: 20 tablet, Rfl: 0 .  vitamin C (ASCORBIC ACID) 500 MG tablet, Take 500 mg by mouth daily., Disp: , Rfl:  .  ZINC OXIDE, TOPICAL, 10 % CREA, Apply 1 application topically See admin instructions. TWO TO THREE TIMES A DAY TO IRRITATED AREAS OF BUTTOCKS, Disp: , Rfl:   Exam: Current vital signs: BP (!) 139/91   Pulse (!) 129   Temp (!) 102 F (38.9 C) (Rectal)   Resp 14   Ht 5\' 4"  (1.626 m)   Wt 57.6 kg (127 lb)   SpO2 96%   BMI 21.80 kg/m  Vital signs in last 24 hours: Temp:  [100.7 F (38.2 C)-102 F (38.9 C)] 102 F (38.9 C) (11/26 1048) Pulse Rate:  [129-134] 129 (11/26 1130) Resp:  [14] 14 (11/26 1045) BP: (129-161)/(64-91) 139/91 (11/26 1130) SpO2:  [95 %-96 %] 96 % (11/26 1130) Weight:  [57.6 kg (127 lb)] 57.6 kg (127 lb) (11/26 1102) General: Awake, alert, in no apparent distress HEENT: Normocephalic, atraumatic, dry mucous membranes, no thyromegaly, no  lymphadenopathy Cardiovascular: S1-S2 heard, regular rate rhythm Abdomen: Nondistended nontender Extremities: Warm well perfused Neurological exam Mental status: Patient is awake, alert, seems to be neglecting the left side of her body. She is nonverbal at this time.  Next line she is not following commands. Cranial nerves: Pupils are equal round reactive to light, she has a rightward gaze preference, and waxing and waning ability to overcome that to look to the left.  No facial asymmetry. Motor exam: Spontaneously moving right upper right lower and left lower extremities.  Not moving the left upper extremity.  According to the husband she had some shaking of her left upper extremity.  Both her legs have tremulous movements consistent and that has been ongoing for many months now according to the husband. Sensory exam: Withdraws briskly to  pain in both lower extremities, withdraws briskly to pain and right upper extremity, some withdrawal in the left upper extremity to pain. Could not perform test for coordination and could not test gait  Labs I have reviewed labs in epic and the results pertinent to this consultation are: CBC    Component Value Date/Time   WBC 9.9 09/23/2017 1950   RBC 3.82 (L) 09/23/2017 1950   HGB 12.2 09/23/2017 1950   HCT 38.4 09/23/2017 1950   PLT 251 09/23/2017 1950   MCV 100.5 (H) 09/23/2017 1950   MCV 99.9 (A) 08/16/2012 1615   MCH 31.9 09/23/2017 1950   MCHC 31.8 09/23/2017 1950   RDW 16.1 (H) 09/23/2017 1950   LYMPHSABS 2.5 09/23/2017 1950   MONOABS 0.6 09/23/2017 1950   EOSABS 0.1 09/23/2017 1950   BASOSABS 0.0 09/23/2017 1950    CMP     Component Value Date/Time   NA 138 09/27/2017 0230   K 3.0 (L) 09/27/2017 0230   CL 106 09/27/2017 0230   CO2 24 09/27/2017 0230   GLUCOSE 139 (H) 09/27/2017 0230   BUN <5 (L) 09/27/2017 0230   CREATININE 1.22 (H) 09/27/2017 0230   CREATININE 1.34 (H) 08/16/2012 1614   CALCIUM 8.3 (L) 09/27/2017 0230   PROT  6.9 09/23/2017 1950   ALBUMIN 2.5 (L) 09/23/2017 1950   AST 55 (H) 09/23/2017 1950   ALT 22 09/23/2017 1950   ALKPHOS 164 (H) 09/23/2017 1950   BILITOT 0.6 09/23/2017 1950   GFRNONAA 42 (L) 09/27/2017 0230   GFRAA 49 (L) 09/27/2017 0230    Lipid Panel     Component Value Date/Time   CHOL 108 09/24/2017 0232   TRIG 81 09/24/2017 0232   HDL 32 (L) 09/24/2017 0232   CHOLHDL 3.4 09/24/2017 0232   VLDL 16 09/24/2017 0232   LDLCALC 60 09/24/2017 0232     Imaging No imaging available today.  Assessment: 76 year old with past history of right parieto-occipital ICH, seizures, chronic kidney disease stage III, insulin-dependent diabetes, hypertension, recent hip surgery, multiple admissions for concern for seizures/toxic metabolic encephalopathy, presenting again for evaluation of altered mental status with last known normal yesterday. On exam she has some neglect of the left side, with gaze preference to the right. She is not within the window for IV TPA as last known normal was greater than 4.5 hours. Her symptoms are waxing and waning, making either seizures or recrudescence of her old symptoms more likely and less likely this being an acute ischemic stroke. She also has evidence of sepsis, and this could all be acute toxic metabolic encephalopathy. She was started on Levaquin for UTI, which could have lowered seizure threshold as well.  Impression: Toxic metabolic encephalopathy Stroke symptoms recrudescence Breakthrough seizure- in the setting of underlying infection as well as due to lowering of seizure threshold from Levaquin Evaluate for new stroke-less likely  Recommendations: - CT had to rule out an acute bleed or evidence of acute evolving ischemia -Routine EEG -Additional dose of Keppra -Correction of toxic metabolic derangements per primary team  Further recommend agents based on test results and clinical course.   Amie Portland, MD [facility] [phone] If 7pm to 7am, please call on call as listed on AMION.

## 2017-09-28 NOTE — ED Provider Notes (Signed)
[facility] EMERGENCY DEPARTMENT Provider Note   CSN: [id number] Arrival date & time: 09/28/17  1026     History   Chief Complaint Chief Complaint  Patient presents with  . Altered Mental Status    HPI [name] [name] is a 76 y.o. female.  HPI    Patient is a 76 year old female presenting with fever and altered mental status.  Patient was discharged yesterday from the hospital.  Patient had received broad-spectrum antibiotics for altered mental status following questionable seizure activity 4 days ago.  The antibiotics were narrowed to Levaquin and patient was discharged.  Upon discharge patient was doing fine, however this morning patient was altered, unresponsive to has been.  Similar to prior presentation.  Patient febrile to 102, with tachycardia and low blood pressures.     Past Medical History:  Diagnosis Date  . Anemia   . Arthritis    "a touch in my hands" (11/17/2016)  . Chronic back pain   . Chronic kidney disease (CKD), active medical management without dialysis, stage 3 (moderate) ([place])   . Depression   . GERD (gastroesophageal reflux disease)   . High cholesterol   . Hyperlipidemia   . Hypertension   . Seizures ([place])    last seizure 2014 (11/17/2016)  . Stroke [facility]) 2013   Secondary to acute right posterior temporo-occipital intra-axial hemorrhage  . Type II diabetes mellitus [facility])     Patient Active Problem List   Diagnosis Date Noted  . Hypotension 09/24/2017  . Chronic diastolic CHF (congestive heart failure) ([place]) 09/24/2017  . Fever   . Purulent dermatitis   . Rhonchi   . Palliative care encounter   . Encounter for hospice care discussion   . DNR (do not resuscitate) discussion   . Postoperative wound infection of right hip   . Status epilepticus ([place]) 08/26/2017  . S/P right hip fracture 08/26/2017  . Status post total hip replacement, right 08/10/2017  . Closed right hip fracture, initial encounter ([place]) 07/27/2017  . Hip  fracture ([place]) 07/27/2017  . Pressure injury of skin 06/14/2017  . Encounter for palliative care   . Goals of care, counseling/discussion   . CAP (community acquired pneumonia) 06/12/2017  . Sepsis ([place]) 06/11/2017  . Weakness 06/11/2017  . Anemia 06/11/2017  . Acute kidney injury superimposed on chronic kidney disease ([place]) 06/05/2017  . Sepsis, unspecified organism ([place]) 06/05/2017  . Partial symptomatic epilepsy with complex partial seizures, not intractable, without status epilepticus ([place])   . Bacteremia due to Escherichia coli 11/18/2016  . Renal insufficiency   . Aortic atherosclerosis ([place]) 11/15/2016  . UTI (urinary tract infection) 11/15/2016  . Paranoia ([place]) 05/24/2015  . Tachycardia 07/02/2013  . Hypophosphatemia 07/02/2013  . Encounter for central line placement 07/02/2013  . Hypokalemia 07/01/2013  . Hypomagnesemia 07/01/2013  . Protein-calorie malnutrition, severe ([place]) 06/29/2013  . Leukocytosis, unspecified 06/29/2013  . 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 TIA (transient ischemic attack) 12/13/2012  . 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 Loss of consciousness ([place]) 09/08/2012  . Acute respiratory failure with hypoxia ([place]) 08/20/2012  . Seizure disorder, grand mal ([place]) 08/20/2012  . Seizure ([place]) 08/20/2012  . CKD (chronic kidney disease) stage 3, GFR 30-59 ml/min (HCC) 08/19/2012  . Leukocytosis 08/19/2012  . Hemorrhage in the brain-13 x 22 x 14 mm right posterior temporo-occipital intra-axial acute 08/19/2012  . Left hip pain 08/19/2012  . Left hemiparesis ([place]) 08/19/2012  . Hemianopia, homonymous, left 08/19/2012  .  Hemisensory deficit, left 08/19/2012  . Nausea 08/19/2012  . Headache(784.0) 08/19/2012  . Acute encephalopathy 08/19/2012  . Hemorrhage of brain, nontraumatic ([place]) 08/18/2012  . Dysphagia 08/18/2012  . HTN (hypertension) 08/18/2012  . DM type 2 (diabetes mellitus, type 2) ([place]) 07/26/2012  . Hypoglycemia due  to insulin 07/26/2012  . GERD (gastroesophageal reflux disease)     Past Surgical History:  Procedure Laterality Date  . CATARACT EXTRACTION W/ INTRAOCULAR LENS IMPLANT Bilateral   . COLONOSCOPY    . DILATION AND CURETTAGE OF UTERUS  X 3  . 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 I&D EXTREMITY Right 09/06/2017   Procedure: IRRIGATION AND DEBRIDEMENT HIP;  Surgeon: Mcarthur Rossetti, MD;  Location: [facility];  Service: Orthopedics;  Laterality: Right;  . TOTAL HIP ARTHROPLASTY Right 07/28/2017   Procedure: TOTAL HIP ARTHROPLASTY ANTERIOR APPROACH;  Surgeon: Mcarthur Rossetti, MD;  Location: [facility];  Service: Orthopedics;  Laterality: Right;    OB History    No data available       Home Medications    Prior to Admission medications   Medication Sig Start Date End Date Taking? Authorizing Provider  aspirin EC 325 MG EC tablet Take 1 tablet (325 mg total) by mouth daily with breakfast. 07/29/17   Mcarthur Rossetti, MD  atorvastatin (LIPITOR) 10 MG tablet Take 10 mg by mouth daily.  02/11/13   [provider]  fludrocortisone (FLORINEF) 0.1 MG tablet Take 0.1 mg by mouth daily. AND MONITOR STANDING B/P DAILY 08/25/17   [provider]  LEVEMIR FLEXTOUCH 100 UNIT/ML Pen INJECT 10 UNITS SUBCUTANEOUSLY DAILY AT NIGHT Patient taking differently: Inject 8 Units into the skin at bedtime as needed (as needed for BGL of at least 100 or greater).  06/15/17   Reyne Dumas, MD  levETIRAcetam (KEPPRA) 100 MG/ML solution Take 5 mLs (500 mg total) 2 (two) times daily by mouth. 09/11/17   Kayleen Memos, DO  Magnesium 500 MG TABS Take 1 tablet (500 mg total) by mouth 2 (two) times daily. 06/11/17   Hongalgi, Lenis Dickinson, MD  metoprolol succinate (TOPROL-XL) 25 MG 24 hr tablet Take 1 tablet (25 mg total) by mouth daily. 06/12/17   Hongalgi, Lenis Dickinson, MD  Multiple Vitamin (MULTIVITAMIN) capsule Take 1 capsule by mouth daily.    [provider]  NOVOLOG FLEXPEN 100 UNIT/ML SOPN FlexPen Inject 4-9 Units into the skin See admin instructions. Three times a day with meals per sliding scale: 4 units with breakfast then 4 units with lunch (but increase to 5 units if BGL is >200) and 8 units with supper (but increase to 9 units if BGL is >200) 07/29/13   [provider]  omeprazole (PRILOSEC) 20 MG capsule Take 20 mg by mouth every morning.  03/17/15   [provider]  ondansetron (ZOFRAN ODT) 4 MG disintegrating tablet Take 1 tablet (4 mg total) by mouth every 8 (eight) hours as needed for nausea or vomiting. Patient not taking: Reported on 09/23/2017 08/10/17   Mcarthur Rossetti, MD  vitamin C (ASCORBIC ACID) 500 MG tablet Take 500 mg by mouth daily.    [provider]  ZINC OXIDE, TOPICAL, 10 % CREA Apply 1 application topically See admin instructions. TWO TO THREE TIMES A DAY TO IRRITATED AREAS OF BUTTOCKS    [provider]  Family History Family History  Problem Relation Age of Onset  . Cancer Mother   . Colon cancer Mother   . Liver disease Mother   . Breast cancer Mother   . Cancer Father   . Diabetes Paternal Grandmother   . Breast cancer Maternal Aunt   . Breast cancer Maternal Aunt   . Esophageal cancer Neg Hx   . Rectal cancer Neg Hx   . Stomach cancer Neg Hx     Social History Social History   Tobacco Use  . Smoking status: Never Smoker  . Smokeless tobacco: Never Used  Substance Use Topics  . Alcohol use: No    Comment: patient drinks caffeinated drinks.   . Drug use: No     Allergies   Barbiturates; Other; and Latex   Review of Systems Review of Systems  Unable to perform ROS: Mental status change  Constitutional: Positive for fatigue and fever. Negative for activity change.  Psychiatric/Behavioral: Positive for agitation and behavioral problems.      Physical Exam Updated Vital Signs BP (!) 139/91   Pulse (!) 129   Temp (!) 102 F (38.9 C) (Rectal)   Resp 14   Ht 5\' 4"  (1.626 m)   Wt 57.6 kg (127 lb)   SpO2 96%   BMI 21.80 kg/m   Physical Exam  Constitutional: She appears well-developed and well-nourished.  Febrile, altered.  Pale cachectic.  HENT:  Head: Normocephalic and atraumatic.  Eyes: Right eye exhibits no discharge. Left eye exhibits no discharge.  Cardiovascular:  No murmur heard. Tachycardia.  Pulmonary/Chest: Effort normal.  Abdominal: Soft. She exhibits no distension. There is no tenderness.  Neurological: A cranial nerve deficit is present. Coordination abnormal.  Initially patient with eyes deviated to the right would not look to the left.  With shaking of bilateral lower extremity's.  Not following instructions..  Skin: Skin is warm and dry. She is not diaphoretic.  Nursing note and vitals reviewed.    ED Treatments / Results  Labs (all labs ordered are listed, but only abnormal results are displayed) Labs Reviewed  CULTURE, BLOOD (ROUTINE X 2)  CULTURE, BLOOD (ROUTINE X 2)  URINE CULTURE  COMPREHENSIVE METABOLIC PANEL  CBC WITH DIFFERENTIAL/PLATELET  URINALYSIS, ROUTINE W REFLEX MICROSCOPIC  INFLUENZA PANEL BY PCR (TYPE A & B)  I-STAT CG4 LACTIC ACID, ED    EKG  EKG Interpretation None       Radiology Dg Chest Port 1 View  Result Date: 09/28/2017 CLINICAL DATA:  Encephalopathy, was conversing last night at 2200 hours, not speaking this morning, difficulty waking patient, shortness of breath, history hypertension, seizures, type II diabetes mellitus EXAM: PORTABLE CHEST 1 VIEW COMPARISON:  Portable exam 1108 hours compared to 09/23/2017 FINDINGS: Normal heart size, mediastinal contours, and pulmonary vascularity. Atherosclerotic calcification aorta. Lungs mildly hyperinflated with biapical scarring. No acute infiltrate, pleural effusion or pneumothorax. Diffuse osseous  demineralization. IMPRESSION: Emphysematous changes with biapical scarring. No acute abnormalities. Electronically Signed   By: Lavonia Dana M.D.   On: 09/28/2017 11:30    Procedures Procedures (including critical care time)  CRITICAL CARE Performed by: Gardiner Sleeper Total critical care time: 60 minutes Critical care time was exclusive of separately billable procedures and treating other patients. Critical care was necessary to treat or prevent imminent or life-threatening deterioration. Critical care was time spent personally by me on the following activities: development of treatment plan with patient and/or surrogate as well as nursing, discussions with consultants, evaluation of patient's response to  treatment, examination of patient, obtaining history from patient or surrogate, ordering and performing treatments and interventions, ordering and review of laboratory studies, ordering and review of radiographic studies, pulse oximetry and re-evaluation of patient's condition.   Medications Ordered in ED Medications  sodium chloride 0.9 % bolus 1,000 mL (1,000 mLs Intravenous New Bag/Given 09/28/17 1150)  piperacillin-tazobactam (ZOSYN) IVPB 3.375 g (3.375 g Intravenous New Bag/Given 09/28/17 1150)  vancomycin (VANCOCIN) IVPB 1000 mg/200 mL premix (1,000 mg Intravenous New Bag/Given 09/28/17 1150)     Initial Impression / Assessment and Plan / ED Course  I have reviewed the triage vital signs and the nursing notes.  Pertinent labs & imaging results that were available during my care of the patient were reviewed by me and considered in my medical decision making (see chart for details).    Patient is a 76 year old female presenting with fever and altered mental status.  Patient was discharged yesterday from the hospital.  Patient had received broad-spectrum antibiotics for altered mental status following questionable seizure activity 4 days ago.  The antibiotics were narrowed to  Levaquin and patient was discharged.  Upon discharge patient was doing fine, however this morning patient was altered, unresponsive to husband.  Similar to prior presentation.  Patient febrile to 102, with tachycardia and low blood pressures.  Discussions with husband it appears the patient had a sharp decline in the last 6 months.  Confirmed DNR/DNI with husband  Concerned that she could be having light seizure activity now.  Discussed with Aroora from neurology, he thinks that is likely a re-presentation of similar symptoms from prior often brought in by infection.  Will get EEG.   We will culture, get urine culture, get chest x-ray.  However patient is already been on extensive antibiotics therefore I do not think that LP or blood cultures will be of much use.  Will restart broad-spectrum antibiotics again.  I think we likely narrowed at discharge and did not catch the organism that is affecting patient.  Oddly looks like blood cultures and urine cultures were negative last time.  There is also have never any evidence of pneumonia.  This leads me to suspect that patient could have infection or other source, meningeal or perhaps flu.     Final Clinical Impressions(s) / ED Diagnoses   Final diagnoses:  None    ED Discharge Orders    None       Kinisha Soper, Fredia Sorrow, MD 09/28/17 1521

## 2017-09-29 ENCOUNTER — Inpatient Hospital Stay (HOSPITAL_COMMUNITY): Payer: Medicare Other

## 2017-09-29 DIAGNOSIS — Z515 Encounter for palliative care: Secondary | ICD-10-CM

## 2017-09-29 DIAGNOSIS — M71051 Abscess of bursa, right hip: Secondary | ICD-10-CM

## 2017-09-29 LAB — URINE CULTURE: CULTURE: NO GROWTH

## 2017-09-29 LAB — GLUCOSE, CAPILLARY
GLUCOSE-CAPILLARY: 137 mg/dL — AB (ref 65–99)
GLUCOSE-CAPILLARY: 123 mg/dL — AB (ref 65–99)

## 2017-09-29 LAB — BASIC METABOLIC PANEL
ANION GAP: 10 (ref 5–15)
CALCIUM: 7.9 mg/dL — AB (ref 8.9–10.3)
CO2: 27 mmol/L (ref 22–32)
Chloride: 102 mmol/L (ref 101–111)
Creatinine, Ser: 1.24 mg/dL — ABNORMAL HIGH (ref 0.44–1.00)
GFR calc Af Amer: 48 mL/min — ABNORMAL LOW (ref >60)
GFR, EST NON AFRICAN AMERICAN: 41 mL/min — AB (ref >60)
GLUCOSE: 197 mg/dL — AB (ref 65–99)
Potassium: 3.6 mmol/L (ref 3.5–5.1)
SODIUM: 139 mmol/L (ref 135–145)

## 2017-09-29 LAB — INFLUENZA PANEL BY PCR (TYPE A & B)
Influenza A By PCR: NEGATIVE
Influenza B By PCR: NEGATIVE

## 2017-09-29 LAB — CBC
HCT: 35.5 % — ABNORMAL LOW (ref 36.0–46.0)
Hemoglobin: 11.2 g/dL — ABNORMAL LOW (ref 12.0–15.0)
MCH: 30.6 pg (ref 26.0–34.0)
MCHC: 31.5 g/dL (ref 30.0–36.0)
MCV: 97 fL (ref 78.0–100.0)
PLATELETS: 159 10*3/uL (ref 150–400)
RBC: 3.66 MIL/uL — ABNORMAL LOW (ref 3.87–5.11)
RDW: 16 % — AB (ref 11.5–15.5)
WBC: 9.5 10*3/uL (ref 4.0–10.5)

## 2017-09-29 LAB — CBG MONITORING, ED
Glucose-Capillary: 174 mg/dL — ABNORMAL HIGH (ref 65–99)
Glucose-Capillary: 128 mg/dL — ABNORMAL HIGH (ref 65–99)

## 2017-09-29 LAB — CK: CK TOTAL: 38 U/L (ref 38–234)

## 2017-09-29 LAB — LACTATE DEHYDROGENASE: LDH: 190 U/L (ref 98–192)

## 2017-09-29 MED ORDER — SODIUM CHLORIDE 0.9 % IV SOLN
500.0000 mg | Freq: Two times a day (BID) | INTRAVENOUS | Status: DC
  Administered 2017-09-29 – 2017-09-30 (×3): 500 mg via INTRAVENOUS
  Filled 2017-09-29 (×4): qty 5

## 2017-09-29 MED ORDER — SODIUM CHLORIDE 0.9 % IV SOLN
10.0000 mg | Freq: Every day | INTRAVENOUS | Status: DC
  Administered 2017-09-29: 10 mg via INTRAVENOUS
  Filled 2017-09-29 (×2): qty 1

## 2017-09-29 MED ORDER — SODIUM CHLORIDE 0.9 % IV BOLUS (SEPSIS)
1000.0000 mL | Freq: Once | INTRAVENOUS | Status: AC
  Administered 2017-09-29: 1000 mL via INTRAVENOUS

## 2017-09-29 MED ORDER — IOPAMIDOL (ISOVUE-300) INJECTION 61%
INTRAVENOUS | Status: AC
  Administered 2017-09-29: 75 mL
  Filled 2017-09-29: qty 75

## 2017-09-29 MED ORDER — PIPERACILLIN-TAZOBACTAM 3.375 G IVPB 30 MIN
3.3750 g | Freq: Four times a day (QID) | INTRAVENOUS | Status: DC
  Administered 2017-09-29 – 2017-09-30 (×3): 3.375 g via INTRAVENOUS
  Filled 2017-09-29 (×4): qty 50

## 2017-09-29 NOTE — ED Notes (Signed)
Regular diet breakfast tray ordered @ 0400.

## 2017-09-29 NOTE — ED Notes (Signed)
MD text paged to update on condition and in regards to PO medication

## 2017-09-29 NOTE — ED Notes (Signed)
MD made aware of increase in HR and BP. States that he will place additional orders for fluid bolus and reassess in 1 hour with text page

## 2017-09-29 NOTE — ED Notes (Signed)
Pt CBG 128 

## 2017-09-29 NOTE — Progress Notes (Addendum)
Neurology Progress Note   S:// Patient seen and examined.  No acute overnight events. No seizure activity noted clinically. EEG was completed and was remarkable only for slowing, representing encephalopathy  O:// Current vital signs: BP (!) 144/101   Pulse (!) 109   Temp 98.7 F (37.1 C) (Rectal)   Resp 17   Ht 5\' 4"  (1.626 m)   Wt 57.6 kg (127 lb)   SpO2 100%   BMI 21.80 kg/m  Vital signs in last 24 hours: Temp:  [98.7 F (37.1 C)-102 F (38.9 C)] 98.7 F (37.1 C) (11/27 0501) Pulse Rate:  [88-134] 109 (11/27 0930) Resp:  [14-21] 17 (11/27 0930) BP: (105-170)/(48-101) 144/101 (11/27 0930) SpO2:  [93 %-100 %] 100 % (11/27 0930) Weight:  [57.6 kg (127 lb)] 57.6 kg (127 lb) (11/26 1102) Essentially unchanged exam from yesterday General: Awake, alert, in no apparent distress HEENT: Normocephalic, atraumatic, dry mucous membranes, no thyromegaly, no lymphadenopathy Cardiovascular: S1-S2 heard, regular rate rhythm Abdomen: Nondistended nontender Extremities: Warm well perfused Neurological exam Mental status: Patient is awake, alert, seems to be neglecting the left side of her body. She is nonverbal at this time.  Next line she is not following commands. Cranial nerves: Pupils are equal round reactive to light, she has a rightward gaze preference, and waxing and waning ability to overcome that to look to the left.  No facial asymmetry. Motor exam: Spontaneously moving right upper right lower and left lower extremities.  Not moving the left upper extremity.   Both her legs have tremulous movements consistent and that has been ongoing for many months now according to the husband. Sensory exam: Withdraws briskly to pain in both lower extremities, withdraws briskly to pain and right upper extremity, some withdrawal in the left upper extremity to pain. Could not perform test for coordination and could not test gait  Medications  Current Facility-Administered Medications:  .  0.9  %  sodium chloride infusion, , Intravenous, Continuous, Waldemar Dickens, MD, Last Rate: 75 mL/hr at 09/29/17 0048 .  acetaminophen (TYLENOL) tablet 650 mg, 650 mg, Oral, Q6H PRN **OR** acetaminophen (TYLENOL) suppository 650 mg, 650 mg, Rectal, Q6H PRN, Waldemar Dickens, MD, 650 mg at 09/28/17 2359 .  famotidine (PEPCID) 10 mg in sodium chloride 0.9 % 25 mL, 10 mg, Intravenous, Q24H, Danford, Christopher P, MD .  heparin injection 5,000 Units, 5,000 Units, Subcutaneous, Q8H, Waldemar Dickens, MD, 5,000 Units at 09/29/17 [phone] .  insulin aspart (novoLOG) injection 0-5 Units, 0-5 Units, Subcutaneous, QHS, Waldemar Dickens, MD .  insulin aspart (novoLOG) injection 0-9 Units, 0-9 Units, Subcutaneous, TID WC, Waldemar Dickens, MD, 2 Units at 09/29/17 [phone] .  levETIRAcetam (KEPPRA) 500 mg in sodium chloride 0.9 % 100 mL IVPB, 500 mg, Intravenous, Q12H, Danford, Christopher P, MD .  magnesium oxide (MAG-OX) tablet 400 mg, 400 mg, Oral, BID, Waldemar Dickens, MD .  ondansetron [facility]) tablet 4 mg, 4 mg, Oral, Q6H PRN **OR** ondansetron (ZOFRAN) injection 4 mg, 4 mg, Intravenous, Q6H PRN, Waldemar Dickens, MD .  piperacillin-tazobactam (ZOSYN) IVPB 3.375 g, 3.375 g, Intravenous, Q8H, Lavenia Atlas, RPH, Stopped at 09/29/17 [phone] .  vancomycin (VANCOCIN) IVPB 750 mg/150 ml premix, 750 mg, Intravenous, Q24H, Mancheril, Darnell Level, RPH  Current Outpatient Medications:  .  aspirin EC 325 MG EC tablet, Take 1 tablet (325 mg total) by mouth daily with breakfast., Disp: 30 tablet, Rfl: 0 .  atorvastatin (LIPITOR) 10 MG tablet, Take 10 mg by mouth  daily. , Disp: , Rfl:  .  fludrocortisone (FLORINEF) 0.1 MG tablet, Take 0.1 mg by mouth daily. AND MONITOR STANDING B/P DAILY, Disp: , Rfl: 2 .  LEVEMIR FLEXTOUCH 100 UNIT/ML Pen, INJECT 10 UNITS SUBCUTANEOUSLY DAILY AT NIGHT (Patient taking differently: Inject 8 Units into the skin at bedtime as needed (as needed for BGL of at least 100 or greater). ), Disp: 15  mL, Rfl: 2 .  levETIRAcetam (KEPPRA) 100 MG/ML solution, Take 5 mLs (500 mg total) 2 (two) times daily by mouth., Disp: 473 mL, Rfl: 0 .  Magnesium 500 MG TABS, Take 1 tablet (500 mg total) by mouth 2 (two) times daily., Disp: , Rfl:  .  metoprolol succinate (TOPROL-XL) 25 MG 24 hr tablet, Take 1 tablet (25 mg total) by mouth daily., Disp: 30 tablet, Rfl: 0 .  Multiple Vitamin (MULTIVITAMIN) capsule, Take 1 capsule by mouth daily., Disp: , Rfl:  .  NOVOLOG FLEXPEN 100 UNIT/ML SOPN FlexPen, Inject 4-9 Units into the skin See admin instructions. Three times a day with meals per sliding scale: 4 units with breakfast then 4 units with lunch (but increase to 5 units if BGL is >200) and 8 units with supper (but increase to 9 units if BGL is >200), Disp: , Rfl:  .  omeprazole (PRILOSEC) 20 MG capsule, Take 20 mg by mouth every morning. , Disp: , Rfl: 5 .  ondansetron (ZOFRAN ODT) 4 MG disintegrating tablet, Take 1 tablet (4 mg total) by mouth every 8 (eight) hours as needed for nausea or vomiting., Disp: 20 tablet, Rfl: 0 .  vitamin C (ASCORBIC ACID) 500 MG tablet, Take 500 mg by mouth daily., Disp: , Rfl:  .  ZINC OXIDE, TOPICAL, 10 % CREA, Apply 1 application topically See admin instructions. TWO TO THREE TIMES A DAY TO IRRITATED AREAS OF BUTTOCKS, Disp: , Rfl:  Labs CBC    Component Value Date/Time   WBC 9.5 09/29/2017 0331   RBC 3.66 (L) 09/29/2017 0331   HGB 11.2 (L) 09/29/2017 0331   HCT 35.5 (L) 09/29/2017 0331   PLT 159 09/29/2017 0331   MCV 97.0 09/29/2017 0331   MCV 99.9 (A) 08/16/2012 1615   MCH 30.6 09/29/2017 0331   MCHC 31.5 09/29/2017 0331   RDW 16.0 (H) 09/29/2017 0331   LYMPHSABS 3.0 09/28/2017 1140   MONOABS 0.7 09/28/2017 1140   EOSABS 0.0 09/28/2017 1140   BASOSABS 0.0 09/28/2017 1140    CMP     Component Value Date/Time   NA 139 09/29/2017 0331   K 3.6 09/29/2017 0331   CL 102 09/29/2017 0331   CO2 27 09/29/2017 0331   GLUCOSE 197 (H) 09/29/2017 0331   BUN <5 (L)  09/29/2017 0331   CREATININE 1.24 (H) 09/29/2017 0331   CREATININE 1.34 (H) 08/16/2012 1614   CALCIUM 7.9 (L) 09/29/2017 0331   PROT 7.2 09/28/2017 1140   ALBUMIN 2.7 (L) 09/28/2017 1140   AST 31 09/28/2017 1140   ALT 17 09/28/2017 1140   ALKPHOS 130 (H) 09/28/2017 1140   BILITOT 0.5 09/28/2017 1140   GFRNONAA 41 (L) 09/29/2017 0331   GFRAA 48 (L) 09/29/2017 0331   EEG-encephalopathy   Assessment:  76 year old with past history of right parieto-occipital ICH, seizures, chronic kidney disease stage III, insulin-dependent diabetes, hypertension, recent hip surgery and multiple admissions for toxic metabolic encephalopathy/seizures presents again for evaluation of altered mental status with last known normal about 2 days ago.  She does have some neglect of the left side  and right gaze preference that continues from yesterday but the EEG done yesterday was unremarkable for any seizure activity. CT scan from yesterday was reviewed by me and was unremarkable for any acute process. MRI was done on 09/24/2017 that did not show any evidence of acute strokes. During her last admission she has had extensive workup including spinal taps and MRIs that have been unrevealing. I suspect that this is toxic metabolic encephalopathy in a person with poor baseline cognitive reserve in the setting of underlying UTI/infection.  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  Goals of care discussion at the appropriate next step at this stage for her.  I  discussed my plan with Dr. Loleta Books.  Neurology will be available as needed.. Please call as needed.  -- Amie Portland, MD [facility] [phone] If 7pm to 7am, please call on call as listed on AMION.

## 2017-09-29 NOTE — Progress Notes (Signed)
PROGRESS NOTE    [name] [name]  CLE:[id number] DOB: 1941-06-06 DOA: 09/28/2017 PCP: [name], MD      Brief Narrative:  76 yo F with hx CVA, DM, HTN, and recent recurrent fevers with seizures presents with fever, confusion.  Patient was living at home with husband (mild dementia, had given up driving/cooking, but otherwise independent and no memory loss) until August of this year, she was admitted with sepsis, found to have E coli bacteremia in 1/2 cultures.  (Interestingly, she had had 1/2 BCx positive for E coli in January 2018 as well.)  Since that hospitalization, she has failed to thrive.  Was readmitted for weakness, then for hip fracture, then for status epilepticus from sepsis from superficial wound infection requiring operative debridement, and finally most recently this past week for syncope.   During her last hospitalization, she had recovered enough that she was able to discharge home. per husband she was interactive and her normal self, just weak the first night home, but by morning she was sluggish, weak, and poorly responsive, so he brought her back to the hospital.    Outline of recent admissions: 8/4 admitted with fever chills tachy 103F WBC 15K --> E coli bacteremia sepsis, only 1/2 BCx + for E coli, Vanc/Zosyn to CTX, discharged w/ HH and cephalexin, d/c 8/9  8/9 day of d/c, couldn't get in door after d/c, readmitted, 4 more days CTX, d/c to SNF on 8/13 for 4 weeks, completed cephalexin, total 10 days   9/24 home 10d from SNF, fell in toilet, broke hip, repaired 9/25 by Dr. Ninfa Linden, d/c 9/27  10/24 readmitted with status epilepticus, seizures 45 minutes, stopped with Keppra, continuous EEG negative (of note, first seizure ever was in 2014, after her 2013 stroke, only had the one seizure ever); LP normal; delirium for 10 days, slowly cleared; this hosp c/b superficial cellulitis at surg site --> ultimately required debridement of the right hip on 11/4, but the  infection did not extend to hip joint  (FINDINGS:  Evidence of a large seroma and adipose tissue and necrosis with no significant purulence and no penetration deep to the joint.), after which delirium cleared; Cortrak, advanced diet and discharegd without tyube on 11/9  Abx (CTx 2 d to Vanc/Zosyn for 5 10/24 to 11/1, then 11/3 restarted for fever, vanc/zosyn 7 days, d/cd on Bactrim for superficial infxn)  11/21  Micturition syncope, lactate 7.8, empiric vanc/Zosyn, MRI brain neg (got 2doses vanc/Zosyn, 2 doses Levaquin during htat hosp, total 4d)    Fever curve: 10/24 104F 11/3 103F 11/8 101F 11/26 102F   Culture data: 11/21 BCx x2 neg 11/21 UCx neg 11/8 BCx x2 neg 11/4 op culture neg 11/3 BCx neg x2 10/25 CSF Cx neg 10/25 UCx neg 10/24 BCx x2 neg 8/9 UCx insig growth 8/9 BCx x2 neg 8/3 UCx insig growth 8/3 BCx 1/2 E coli pan susc 1/13 BCx 1/2 E coli pan susc 1/13 UCx 60K E coli   Interestingly 1/13-1/27 admission for "syncope" and 1/2 Ecoli bacteremia, treated with meropenem, d/c'd Keflex         Assessment & Plan:  Active Problems:   GERD (gastroesophageal reflux disease)   Diabetes mellitus with complication (HCC)   CKD (chronic kidney disease) stage 3, GFR 30-59 ml/min (HCC)   Acute encephalopathy   Seizure (HCC)   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 Probable sepsis vs possible fever unknown origin  Since August, the patient has had recurrent fevers, accompanied by delirium/AMS and sometimes seizures.  She has had two documented infections (1/2 E coli bacteremia in index admission in August, and then soft tissue infection of right hip in early November), and so LIKELY this fever does represent sepsis/infection.  Will seek out deep loci of infection.  Will also attempt limited FUO w/u -IV fluids -Continue Vanc/Zosyn -CT C/A/P -Check LDH, SPEP -Check ANA, RF; HIV neg   2.  Seizures First and only seizure prior to last month was 2014 per husband.  He describes her seizures as just being related to her 2013 hemorrhagic stroke.  She is on home Keppra recently, was admitted for suspected status in Oct.  EEG shows nothing specific for seizure. -Consult Neuro -Continue Keppra  3. Hypertension, CV disease secondary prevention -Continue aspirin, statin -Continue metoprolol  4. Chronic kidney disease stage III Baseline Cr 1.3, near baseline -Monitor BMP  5. Other medications -Florinef in med list, but holding for now, unclear what her indication for this is  6. Diabetes -Hold Levemir for now -SSI and restart Levemir as needed      DVT prophylaxis: Heparin Code Status: Intubation okay, no CPR Family Communication: Husband at bedside Disposition Plan: CT of chest abdomen and pelvis.  Continue IV antibiotics.  Treatment of sepsis and follow culture data.   Consultants:   Neuro  Procedures:   EEG  Antimicrobials:   Vanc 11/26 >>  Zosyn 11/26 >>    Subjective: Obtunded.  Per husband, she is unchanged from yesterday.  Very weak debilitated, confused, tremulous.  Objective: Vitals:   09/29/17 1724 09/29/17 1829 09/29/17 2206 09/29/17 2206  BP:    (!) 146/61  Pulse:    (!) 114  Resp:    16  Temp: (!) 102.2 F (39 C) (!) 101.7 F (38.7 C) 97.9 F (36.6 C)   TempSrc: Axillary Axillary    SpO2:    97%  Weight:      Height:        Intake/Output Summary (Last 24 hours) at 09/29/2017 2224 Last data filed at 09/29/2017 1800 Gross per 24 hour  Intake 1470 ml  Output 900 ml  Net 570 ml   Filed Weights   09/28/17 1102 09/29/17 1554  Weight: 57.6 kg (127 lb) 60.3 kg (132 lb 15 oz)    Examination: General appearance: Frail elderly adult female, lying in bed, looks at me, but too sluggish to make any response.  Appears diaphoretic and weak.   HEENT: Anicteric, conjunctiva pink, lids and lashes normal. No nasal deformity, discharge,  epistaxis.  Lips dry, will not open mouth.   Skin: Warm and dry.  No jaundice.  No suspicious rashes or lesions. The right hip actually looks good, without too much overlying redness or swelling. Cardiac: Tachycardic, regular, nl S1-S2, no murmurs appreciated.  Capillary refill is brisk, no mottling. No LE edema.   Respiratory: Tachypneic, no wheezes, lung sounds diminished. Abdomen: Abdomen soft.  No grimace to palpation.  Involuntary guarding noted. No ascites, distension, hepatosplenomegaly.   MSK: No deformities or effusions.  Healing right hip scar. Neuro: Awake and opens eyes. Has right gaze preference and left neglect that is old, exacerbated by illness, per Neurology (who have met the patient before).  Does not follow commands.     Psych: Unable to assess.    Data Reviewed: I have personally reviewed following labs and imaging studies:  CBC: Recent Labs  Lab 09/23/17 1946 09/23/17 1950 09/28/17 1140  09/29/17 0331  WBC  --  9.9 10.4 9.5  NEUTROABS  --  6.8 6.6  --   HGB 14.6 12.2 12.9 11.2*  HCT 43.0 38.4 40.2 35.5*  MCV  --  100.5* 97.6 97.0  PLT  --  251 212 161   Basic Metabolic Panel: Recent Labs  Lab 09/24/17 0232 09/25/17 0245 09/26/17 0212 09/27/17 0230 09/28/17 1140 09/29/17 0331  NA  --  143 142 138 139 139  K  --  2.6* 3.5 3.0* 3.8 3.6  CL  --  111 113* 106 104 102  CO2  --  24 21* _0 GLUCOSE  --  123* 172* 139* 212* 197*  BUN  --  11 7 <5* <5* <5*  CREATININE  --  1.42* 1.36* 1.22* 1.30* 1.24*  CALCIUM  --  8.3* 8.3* 8.3* 8.7* 7.9*  MG 1.5*  --   --   --   --   --    GFR: Estimated Creatinine Clearance: 33.3 mL/min (A) (by C-G formula based on SCr of 1.24 mg/dL (H)). Liver Function Tests: Recent Labs  Lab 09/23/17 1950 09/28/17 1140  AST 55* 31  ALT 22 17  ALKPHOS 164* 130*  BILITOT 0.6 0.5  PROT 6.9 7.2  ALBUMIN 2.5* 2.7*   No results for input(s): LIPASE, AMYLASE in the last 168 hours. Recent Labs  Lab 09/23/17 2330  AMMONIA  19   Coagulation Profile: No results for input(s): INR, PROTIME in the last 168 hours. Cardiac Enzymes: Recent Labs  Lab 09/28/17 1921 09/29/17 0331  CKTOTAL 26* 38   BNP (last 3 results) No results for input(s): PROBNP in the last 8760 hours. HbA1C: No results for input(s): HGBA1C in the last 72 hours. CBG: Recent Labs  Lab 09/28/17 2249 09/29/17 0745 09/29/17 1319 09/29/17 1657 09/29/17 2210  GLUCAP 185* 174* 128* 137* 123*   Lipid Profile: No results for input(s): CHOL, HDL, LDLCALC, TRIG, CHOLHDL, LDLDIRECT in the last 72 hours. Thyroid Function Tests: No results for input(s): TSH, T4TOTAL, FREET4, T3FREE, THYROIDAB in the last 72 hours. Anemia Panel: No results for input(s): VITAMINB12, FOLATE, FERRITIN, TIBC, IRON, RETICCTPCT in the last 72 hours. Urine analysis:    Component Value Date/Time   COLORURINE STRAW (A) 09/28/2017 1727   APPEARANCEUR HAZY (A) 09/28/2017 1727   LABSPEC 1.010 09/28/2017 1727   PHURINE 6.0 09/28/2017 1727   GLUCOSEU 50 (A) 09/28/2017 1727   HGBUR SMALL (A) 09/28/2017 1727   BILIRUBINUR NEGATIVE 09/28/2017 1727   BILIRUBINUR neg 08/16/2012 1531   KETONESUR 5 (A) 09/28/2017 1727   PROTEINUR NEGATIVE 09/28/2017 1727   UROBILINOGEN 0.2 06/25/2013 1002   NITRITE NEGATIVE 09/28/2017 1727   LEUKOCYTESUR LARGE (A) 09/28/2017 1727   Sepsis Labs: _1 (procalcitonin:4,lacticidven:4)  ) Recent Results (from the past 240 hour(s))  Blood Culture (routine x 2)     Status: None   Collection Time: 09/23/17  7:50 PM  Result Value Ref Range Status   Specimen Description BLOOD LEFT HAND  Final   Special Requests IN PEDIATRIC BOTTLE Blood Culture adequate volume  Final   Culture NO GROWTH 5 DAYS  Final   Report Status 09/28/2017 FINAL  Final  Urine culture     Status: None   Collection Time: 09/23/17  8:09 PM  Result Value Ref Range Status   Specimen Description URINE, CATHETERIZED  Final   Special Requests NONE  Final   Culture NO  GROWTH  Final   Report Status 09/25/2017 FINAL  Final  Blood Culture (routine x 2)     Status: None   Collection Time: 09/23/17  8:23 PM  Result Value Ref Range Status   Specimen Description BLOOD LEFT HAND  Final   Special Requests IN PEDIATRIC BOTTLE Blood Culture adequate volume  Final   Culture NO GROWTH 5 DAYS  Final   Report Status 09/28/2017 FINAL  Final  MRSA PCR Screening     Status: None   Collection Time: 09/24/17  1:42 AM  Result Value Ref Range Status   MRSA by PCR NEGATIVE NEGATIVE Final    Comment:        The GeneXpert MRSA Assay (FDA approved for NASAL specimens only), is one component of a comprehensive MRSA colonization surveillance program. It is not intended to diagnose MRSA infection nor to guide or monitor treatment for MRSA infections.   Blood Culture (routine x 2)     Status: None (Preliminary result)   Collection Time: 09/28/17 11:40 AM  Result Value Ref Range Status   Specimen Description BLOOD LEFT HAND  Final   Special Requests   Final    BOTTLES DRAWN AEROBIC AND ANAEROBIC Blood Culture adequate volume   Culture NO GROWTH 1 DAY  Final   Report Status PENDING  Incomplete  Urine culture     Status: None   Collection Time: 09/28/17  5:21 PM  Result Value Ref Range Status   Specimen Description URINE, RANDOM  Final   Special Requests NONE  Final   Culture NO GROWTH  Final   Report Status 09/29/2017 FINAL  Final         Radiology Studies: Ct Head Wo Contrast  Result Date: 09/28/2017 CLINICAL DATA:  Altered mental status. EXAM: CT HEAD WITHOUT CONTRAST TECHNIQUE: Contiguous axial images were obtained from the base of the skull through the vertex without intravenous contrast. COMPARISON:  MRI brain dated September 24, 2017. CT head dated September 23, 2017. FINDINGS: Brain: No evidence of acute infarction, hemorrhage, hydrocephalus, extra-axial collection or mass lesion/mass effect. Unchanged diffuse cerebral atrophy and ex vacuo dilatation of the  ventricles. 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 Vascular: No hyperdense vessel or unexpected calcification. Skull: Normal. Negative for fracture or focal lesion. Sinuses/Orbits: No acute finding. Other: None. IMPRESSION: 1. No acute intracranial abnormality. Stable cerebral atrophy and chronic microvascular ischemic white matter disease. Electronically Signed   By: Titus Dubin M.D.   On: 09/28/2017 13:56   Ct Chest W Contrast  Result Date: 09/29/2017 CLINICAL DATA:  Unresolved pneumonia and abdominal infection. Fever, chills and elevated white blood cell count. EXAM: CT CHEST, ABDOMEN, AND PELVIS WITH CONTRAST TECHNIQUE: Multidetector CT imaging of the chest, abdomen and pelvis was performed following the standard protocol during bolus administration of intravenous contrast. CONTRAST:  65m ISOVUE-300 IOPAMIDOL (ISOVUE-300) INJECTION 61% COMPARISON:  CT scan 01/11/2008 and abdominal ultrasound examination 06/06/2017 FINDINGS: CT CHEST FINDINGS Cardiovascular: The heart is within normal limits in size for age. No pericardial effusion. Fluid noted in the pericardial recesses. The aorta is normal in caliber. There is tortuosity, mild ectasia and scattered atherosclerotic calcifications. In scattered coronary artery calcifications are also noted. Mediastinum/Nodes: No mass or adenopathy. The esophagus is grossly normal. Lungs/Pleura: Dense biapical pleural and parenchymal scarring type changes. Patchy areas of bronchiectasis are noted. No interstitial lung disease. Bibasilar subpleural atelectasis but no definite infiltrates or edema. No worrisome pulmonary lesions. Musculoskeletal: No breast masses, supraclavicular or axillary lymphadenopathy. The thyroid gland is grossly normal. No significant bony findings. CT ABDOMEN PELVIS FINDINGS  Hepatobiliary: No focal hepatic lesions or intrahepatic biliary dilatation. Gallbladder is mildly distended but no definite CT findings for acute  cholecystitis. Normal caliber common bile duct. Pancreas: Severe pancreatic atrophy but no mass, inflammation or ductal dilatation. Spleen: Normal size.  No focal lesions. Adrenals/Urinary Tract: The adrenal glands are unremarkable. Stable large complex hyperdense/hemorrhagic cyst associated with the left kidney. Other smaller cysts are noted bilaterally. No worrisome renal lesions or hydronephrosis. No obvious bladder lesions. Stomach/Bowel: The stomach, duodenum, small bowel and colon are grossly normal without oral contrast. No inflammatory changes, mass lesions or obstructive findings. The terminal ileum and appendix are normal. Vascular/Lymphatic: The aorta is normal in caliber. No dissection. Moderate scattered atherosclerotic calcifications. The branch vessels are patent. The major venous structures are patent. No mesenteric or retroperitoneal mass or adenopathy. Small scattered lymph nodes are noted. Reproductive: Surgically absent. Other: No pelvic mass or adenopathy. No free pelvic fluid collections. No inguinal mass or adenopathy. No abdominal wall hernia or subcutaneous lesions. Musculoskeletal: There is a total right hip arthroplasty with significant artifact. There is a elongated rim enhancing fluid collection superficial to the muscular fascia overlying the right hip. This measures 9.7 x 4.1 x 1.6 cm and could be an abscess. IMPRESSION: 1. No significant acute findings in the chest, abdomen or pelvis. 2. Elongated rim enhancing fluid collection overlying the right hip suspicious for an abscess. Recommend aspiration. Electronically Signed   By: Marijo Sanes M.D.   On: 09/29/2017 11:22   Ct Abdomen Pelvis W Contrast  Result Date: 09/29/2017 CLINICAL DATA:  Unresolved pneumonia and abdominal infection. Fever, chills and elevated white blood cell count. EXAM: CT CHEST, ABDOMEN, AND PELVIS WITH CONTRAST TECHNIQUE: Multidetector CT imaging of the chest, abdomen and pelvis was performed following the  standard protocol during bolus administration of intravenous contrast. CONTRAST:  90m ISOVUE-300 IOPAMIDOL (ISOVUE-300) INJECTION 61% COMPARISON:  CT scan 01/11/2008 and abdominal ultrasound examination 06/06/2017 FINDINGS: CT CHEST FINDINGS Cardiovascular: The heart is within normal limits in size for age. No pericardial effusion. Fluid noted in the pericardial recesses. The aorta is normal in caliber. There is tortuosity, mild ectasia and scattered atherosclerotic calcifications. In scattered coronary artery calcifications are also noted. Mediastinum/Nodes: No mass or adenopathy. The esophagus is grossly normal. Lungs/Pleura: Dense biapical pleural and parenchymal scarring type changes. Patchy areas of bronchiectasis are noted. No interstitial lung disease. Bibasilar subpleural atelectasis but no definite infiltrates or edema. No worrisome pulmonary lesions. Musculoskeletal: No breast masses, supraclavicular or axillary lymphadenopathy. The thyroid gland is grossly normal. No significant bony findings. CT ABDOMEN PELVIS FINDINGS Hepatobiliary: No focal hepatic lesions or intrahepatic biliary dilatation. Gallbladder is mildly distended but no definite CT findings for acute cholecystitis. Normal caliber common bile duct. Pancreas: Severe pancreatic atrophy but no mass, inflammation or ductal dilatation. Spleen: Normal size.  No focal lesions. Adrenals/Urinary Tract: The adrenal glands are unremarkable. Stable large complex hyperdense/hemorrhagic cyst associated with the left kidney. Other smaller cysts are noted bilaterally. No worrisome renal lesions or hydronephrosis. No obvious bladder lesions. Stomach/Bowel: The stomach, duodenum, small bowel and colon are grossly normal without oral contrast. No inflammatory changes, mass lesions or obstructive findings. The terminal ileum and appendix are normal. Vascular/Lymphatic: The aorta is normal in caliber. No dissection. Moderate scattered atherosclerotic  calcifications. The branch vessels are patent. The major venous structures are patent. No mesenteric or retroperitoneal mass or adenopathy. Small scattered lymph nodes are noted. Reproductive: Surgically absent. Other: No pelvic mass or adenopathy. No free pelvic fluid collections. No inguinal  mass or adenopathy. No abdominal wall hernia or subcutaneous lesions. Musculoskeletal: There is a total right hip arthroplasty with significant artifact. There is a elongated rim enhancing fluid collection superficial to the muscular fascia overlying the right hip. This measures 9.7 x 4.1 x 1.6 cm and could be an abscess. IMPRESSION: 1. No significant acute findings in the chest, abdomen or pelvis. 2. Elongated rim enhancing fluid collection overlying the right hip suspicious for an abscess. Recommend aspiration. Electronically Signed   By: Marijo Sanes M.D.   On: 09/29/2017 11:22   Dg Chest Port 1 View  Result Date: 09/28/2017 CLINICAL DATA:  Encephalopathy, was conversing last night at 2200 hours, not speaking this morning, difficulty waking patient, shortness of breath, history hypertension, seizures, type II diabetes mellitus EXAM: PORTABLE CHEST 1 VIEW COMPARISON:  Portable exam 1108 hours compared to 09/23/2017 FINDINGS: Normal heart size, mediastinal contours, and pulmonary vascularity. Atherosclerotic calcification aorta. Lungs mildly hyperinflated with biapical scarring. No acute infiltrate, pleural effusion or pneumothorax. Diffuse osseous demineralization. IMPRESSION: Emphysematous changes with biapical scarring. No acute abnormalities. Electronically Signed   By: Lavonia Dana M.D.   On: 09/28/2017 11:30        Scheduled Meds: . heparin  5,000 Units Subcutaneous Q8H  . insulin aspart  0-5 Units Subcutaneous QHS  . insulin aspart  0-9 Units Subcutaneous TID WC  . magnesium oxide  400 mg Oral BID   Continuous Infusions: . sodium chloride 75 mL/hr at 09/29/17 1753  . famotidine (PEPCID) IV Stopped  (09/29/17 1501)  . levETIRAcetam Stopped (09/29/17 1322)  . piperacillin-tazobactam 3.375 g (09/29/17 1754)  . vancomycin       LOS: 1 day    Time spent: 50 minutes    Edwin Dada, MD [facility] Pager [phone]  If 7PM-7AM, please contact night-coverage [email or web address] Password [facility] 09/29/2017, 10:24 PM

## 2017-09-29 NOTE — ED Notes (Signed)
Pt taken to CT. Plan of care discussed with admitting MD and husband.

## 2017-09-29 NOTE — ED Notes (Signed)
admitting Provider at bedside. 

## 2017-09-29 NOTE — Progress Notes (Signed)
Request by Dr. Loleta Books for antibiotics records from previous admission.   8/4: Zosyn 3.375g IV x 2 doses received 8/4-8/5 Rocephin 2g IV q 24h x 2 doses received 8/6-8/9 Keflex 500mg  po q 8 hrs 8/9 Rocephin 2g IV x 1 8/10 Keflex 500mg  q 8hrs x 3 doses 8/11-8/13 Rocephin 2g IV q 24h x 3 doses received  8/23-8/24 Rocephin 2g IV q 24h x 2 doses received  9/25-9/26 Ancef 2g IV q 6 hrs x 2 doses received  10/18-10/19: Acyclovir 500mg  IV q 12 hrs x 3 doses received 10/24 Rocephin 2g IV x 1 10/25 Rocephin 1g IV x 1 10/26-10/31 Vanco 750mg  IV q 12 hrs 11/3-11/7 Zosyn 3.375g IV q 8 hr 11/4-11/8 Vanco 750mg  IV q 24h 11/8 Vanco 500mg  x 1 11/9: Bactrim DS 11/22-11/23: Zosyn 3.375g IV x 4 doses received 11/23-11/24: Levaquin 250mg  daily x 2 doses received 11/26-current, Zosyn 3.375g IV q 8 hr  Gaila Engebretsen S. Alford Highland, PharmD, [facility] Clinical Staff Pharmacist Pager [phone]

## 2017-09-29 NOTE — ED Notes (Signed)
Regular diet lunch tray ordered @ 1009.

## 2017-09-29 NOTE — Progress Notes (Signed)
Pharmacy Antibiotic Note  [name] [name] is a 76 y.o. female admitted on 09/28/2017 with sepsis.  Pharmacy managing Zosyn and vancomycin dosing. Patient was recently discharged from [facility] after a brief course of Zosyn and Levaquin. WBC wnl. LA 2.26>1.46. SCr 1.3>1.24, CrCl ~ 30-35 mL/min. Tm 101.43F  Patient only has one line and Zosyn is incompatible with IV keppra and IV pepcid. Challenging to infuse all three meds with extended interval Zosyn infusion     Plan: -Switch Zosyn to 30-min infusion of 3.375 gm IV Q 6 hours  -Continue vancomycin 750 mg IV Q 24 hours  -Monitor CBC, renal fx, cultures and clinical progress -VT at SS   Height: 5\' 4"  (162.6 cm) Weight: 127 lb (57.6 kg) IBW/kg (Calculated) : 54.7  Temp (24hrs), Avg:100.3 F (37.9 C), Min:98.7 F (37.1 C), Max:101.6 F (38.7 C)  Recent Labs  Lab 09/23/17 1947 09/23/17 1950 09/23/17 2203 09/25/17 0245 09/26/17 0212 09/27/17 0230 09/28/17 1140 09/28/17 1156 09/28/17 1737 09/29/17 0331  WBC  --  9.9  --   --   --   --  10.4  --   --  9.5  CREATININE  --  1.97*  --  1.42* 1.36* 1.22* 1.30*  --   --  1.24*  LATICACIDVEN 7.88*  --  1.42  --   --   --   --  2.26* 1.46  --     Estimated Creatinine Clearance: 33.3 mL/min (A) (by C-G formula based on SCr of 1.24 mg/dL (H)).    Allergies  Allergen Reactions  . Barbiturates Other (See Comments)    Restlessness  . Other Other (See Comments)    "Strong meds" ("like sleeping pills") = Restlessness  . Latex Itching    Antimicrobials this admission: Zosyn 11/26 >> Vanc 11/26 >>   Dose adjustments this admission: None   Microbiology results: 11/26 BCx:  11/26 UCx:   Thank you for allowing pharmacy to be a part of this patient's care.  Albertina Parr, PharmD., BCPS Clinical Pharmacist Pager [phone]

## 2017-09-29 NOTE — Progress Notes (Addendum)
Patient ID: [name] [name], female   DOB: 01-29-1941, 76 y.o.   MRN: [id number] Patient discussed with Dr. Ninfa Linden and he indicates he is familiar with her and will see her. Does not think she should be NPO, if he decides for surgery it would be tomorrow afternoon and he would order NPO status in the morning when he sees her.

## 2017-09-29 NOTE — Consult Note (Signed)
Consultation Note Date: 09/29/2017   Patient Name: [name] [name]  DOB: [birth year]  MRN: [id number]  Age / Sex: 76 y.o., female  PCP: [name], MD Referring Physician: Edwin Dada, *  Reason for Consultation: Establishing goals of care  HPI/Patient Profile: 76 y.o. female  with past medical history of CKD 3, CVA, DM, Seizure, hip fracture (9/25), and essentially bed bound status who was admitted on 09/28/2017 with acute encephalopathy.  Mrs. Pressly has had 5 recent admissions with a similar presentation on the last two admissions.  She has had bacteremia and a recent ID at the site of her hip repair on 11/4.  This admission, initial work up does not reveal a clear source of encephalopathy.  Neurology has been consulted.  She does have diffuse slowing on her EEG.  Clinical Assessment and Goals of Care:  I have reviewed medical records including EPIC notes, labs and imaging, received report from the attending [facility] physician, assessed the patient and then talked on the phone with her husband to arrange a time to meet at the hospital.  Mr. Philley remembered me and the Palliative team from a recent admission.  He commented that his wife came home after discharge and briefly did well.  She ate and was watching the discovery channel with him.  Then in the morning he could not get a response from her and decided to bring her back to the hospital.    We discussed a brief life review of the patient. She was a full time mother from [place].  She is [employer] who enjoyed hockey and beading.   Per her husband she has been confused since having a series of seizures approximately 4 years ago.  She had sepsis from UTI in August and really has not be able to ambulate since.  She has not felt like eating or drinking at home despite a lot of encouragement from her family.  Her adult daughter lives at home with the patient and her  husband.  Virgina Evener and I made a plan to meet on 11/28 at 9:30 at the patient's room we will discuss the difference between aggressive medical intervention and comfort care.  During our previous meetings Mr. Begley was reluctant to accept hospice services (he simply wasn't ready).  Mrs. Mash is hospice appropriate.     Primary Decision Maker:  NEXT OF KIN husband Chaya Dehaan.    SUMMARY OF RECOMMENDATIONS    TRH preceding with thorough work up - of which husband is very Patent attorney. PMT will meet with husband at 9:30 am on 11/28.  NO CPR NO PEG Tube.   Code Status/Advance Care Planning:  Partial Code, no CPR  Symptom Management:   Deferred to Primary team until more palliative goals are established.  Additional Recommendations (Limitations, Scope, Preferences):  Full Scope Treatment  Palliative Prophylaxis:   Aspiration  Psycho-social/Spiritual:   Desire for further Chaplaincy support:  Yes, Methodist.  Prognosis: Poor.  Given recurrent encephalopathy, rapid decline, recurrent hospitalizations.  Less than 2 week would  not be surprising.   Discharge Planning: To Be Determined      Primary Diagnoses: Present on Admission: . Acute encephalopathy . CKD (chronic kidney disease) stage 3, GFR 30-59 ml/min (HCC) . GERD (gastroesophageal reflux disease) . Sepsis, unspecified organism ([place])   I have reviewed the medical record, interviewed the patient and family, and examined the patient. The following aspects are pertinent.  Past Medical History:  Diagnosis Date  . Anemia   . Arthritis    "a touch in my hands" (11/17/2016)  . Chronic back pain   . Chronic kidney disease (CKD), active medical management without dialysis, stage 3 (moderate) ([place])   . Depression   . GERD (gastroesophageal reflux disease)   . High cholesterol   . Hyperlipidemia   . Hypertension   . Seizures ([place])    last seizure 2014 (11/17/2016)  . Stroke [facility]) 2013   Secondary to acute right  posterior temporo-occipital intra-axial hemorrhage  . Type II diabetes mellitus ([place])    Social History   Socioeconomic History  . Marital status: Married    Spouse name: None  . Number of children: None  . Years of education: None  . Highest education level: None  Social Needs  . Financial resource strain: None  . Food insecurity - worry: None  . Food insecurity - inability: None  . Transportation needs - medical: None  . Transportation needs - non-medical: None  Occupational History  . Occupation: Retired  Tobacco Use  . Smoking status: Never Smoker  . Smokeless tobacco: Never Used  Substance and Sexual Activity  . Alcohol use: No    Comment: patient drinks caffeinated drinks.   . Drug use: No  . Sexual activity: No  Other Topics Concern  . None  Social History Narrative   Patient lives at home with her husband and daughter and has a high school education.    Family History  Problem Relation Age of Onset  . Cancer Mother   . Colon cancer Mother   . Liver disease Mother   . Breast cancer Mother   . Cancer Father   . Diabetes Paternal Grandmother   . Breast cancer Maternal Aunt   . Breast cancer Maternal Aunt   . Esophageal cancer Neg Hx   . Rectal cancer Neg Hx   . Stomach cancer Neg Hx    Scheduled Meds: . aspirin  325 mg Oral Q breakfast  . atorvastatin  10 mg Oral Daily  . heparin  5,000 Units Subcutaneous Q8H  . insulin aspart  0-5 Units Subcutaneous QHS  . insulin aspart  0-9 Units Subcutaneous TID WC  . magnesium oxide  400 mg Oral BID  . metoprolol succinate  25 mg Oral Daily  . pantoprazole  40 mg Oral Daily   Continuous Infusions: . sodium chloride 75 mL/hr at 09/29/17 0048  . piperacillin-tazobactam (ZOSYN)  IV 3.375 g (09/29/17 0359)  . vancomycin     PRN Meds:.acetaminophen **OR** acetaminophen, ondansetron **OR** ondansetron (ZOFRAN) IV Allergies  Allergen Reactions  . Barbiturates Other (See Comments)    Restlessness  . Other Other (See  Comments)    "Strong meds" ("like sleeping pills") = Restlessness  . Latex Itching   Review of Systems patient unable to speak.  Physical Exam  Well developed elderly female with pronounced tremor in her right sided extremities.   Lips are very dry.  She is lethargic and non verbal. Does not follow commands, but does intentionally scratch her nose CV tachy Resp  no distress or increased work of breathing Abdomen firm, slight tenderness to palpation   Vital Signs: BP (!) 136/59   Pulse 100   Temp 98.7 F (37.1 C) (Rectal)   Resp 18   Ht 5\' 4"  (1.626 m)   Wt 57.6 kg (127 lb)   SpO2 94%   BMI 21.80 kg/m        SpO2: SpO2: 94 % O2 Device:SpO2: 94 % O2 Flow Rate: .   IO: Intake/output summary:   Intake/Output Summary (Last 24 hours) at 09/29/2017 0833 Last data filed at 09/28/2017 1340 Gross per 24 hour  Intake 1405 ml  Output -  Net 1405 ml    LBM:   Baseline Weight: Weight: 57.6 kg (127 lb) Most recent weight: Weight: 57.6 kg (127 lb)     Palliative Assessment/Data:10%     Time In: 8:00 Time Out: 8:50 Time Total: 50 min. Greater than 50%  of this time was spent counseling and coordinating care related to the above assessment and plan.  Signed by: Florentina Jenny, PA-C Palliative Medicine Pager: [phone]  Please contact Palliative Medicine Team phone at [phone] for questions and concerns.  For individual provider: See Shea Evans

## 2017-09-30 DIAGNOSIS — Z515 Encounter for palliative care: Secondary | ICD-10-CM

## 2017-09-30 LAB — CBC
HCT: 33 % — ABNORMAL LOW (ref 36.0–46.0)
Hemoglobin: 10.5 g/dL — ABNORMAL LOW (ref 12.0–15.0)
MCH: 31.6 pg (ref 26.0–34.0)
MCHC: 31.8 g/dL (ref 30.0–36.0)
MCV: 99.4 fL (ref 78.0–100.0)
PLATELETS: 170 10*3/uL (ref 150–400)
RBC: 3.32 MIL/uL — ABNORMAL LOW (ref 3.87–5.11)
RDW: 16.6 % — AB (ref 11.5–15.5)
WBC: 9.8 10*3/uL (ref 4.0–10.5)

## 2017-09-30 LAB — PROTEIN ELECTROPHORESIS, SERUM
A/G Ratio: 0.7 (ref 0.7–1.7)
ALBUMIN ELP: 2.4 g/dL — AB (ref 2.9–4.4)
ALPHA-1-GLOBULIN: 0.2 g/dL (ref 0.0–0.4)
ALPHA-2-GLOBULIN: 0.8 g/dL (ref 0.4–1.0)
BETA GLOBULIN: 0.9 g/dL (ref 0.7–1.3)
GAMMA GLOBULIN: 1.4 g/dL (ref 0.4–1.8)
Globulin, Total: 3.3 g/dL (ref 2.2–3.9)
M-SPIKE, %: 0.2 g/dL — AB
Total Protein ELP: 5.7 g/dL — ABNORMAL LOW (ref 6.0–8.5)

## 2017-09-30 LAB — BASIC METABOLIC PANEL
Anion gap: 10 (ref 5–15)
CHLORIDE: 107 mmol/L (ref 101–111)
CO2: 24 mmol/L (ref 22–32)
CREATININE: 1.35 mg/dL — AB (ref 0.44–1.00)
Calcium: 7.5 mg/dL — ABNORMAL LOW (ref 8.9–10.3)
GFR calc Af Amer: 43 mL/min — ABNORMAL LOW (ref >60)
GFR calc non Af Amer: 37 mL/min — ABNORMAL LOW (ref >60)
Glucose, Bld: 137 mg/dL — ABNORMAL HIGH (ref 65–99)
Potassium: 2.9 mmol/L — ABNORMAL LOW (ref 3.5–5.1)
SODIUM: 141 mmol/L (ref 135–145)

## 2017-09-30 LAB — GLUCOSE, CAPILLARY: Glucose-Capillary: 145 mg/dL — ABNORMAL HIGH (ref 65–99)

## 2017-09-30 LAB — MAGNESIUM: Magnesium: 0.7 mg/dL — CL (ref 1.7–2.4)

## 2017-09-30 LAB — ANA W/REFLEX IF POSITIVE: Anti Nuclear Antibody(ANA): NEGATIVE

## 2017-09-30 LAB — RHEUMATOID FACTOR: Rhuematoid fact SerPl-aCnc: <10 IU/mL (ref 0.0–13.9)

## 2017-09-30 MED ORDER — HALOPERIDOL LACTATE 5 MG/ML IJ SOLN: 0.5000 mg | INTRAMUSCULAR | Status: DC | PRN

## 2017-09-30 MED ORDER — MAGNESIUM SULFATE 4 GM/100ML IV SOLN
4.0000 g | Freq: Once | INTRAVENOUS | Status: DC
  Filled 2017-09-30 (×2): qty 100

## 2017-09-30 MED ORDER — LORAZEPAM 1 MG PO TABS: 1.0000 mg | ORAL_TABLET | ORAL | Status: AC | PRN

## 2017-09-30 MED ORDER — GLYCOPYRROLATE 0.2 MG/ML IJ SOLN: 0.2000 mg | INTRAMUSCULAR | Status: DC | PRN

## 2017-09-30 MED ORDER — POTASSIUM CHLORIDE CRYS ER 20 MEQ PO TBCR: 40.0000 meq | EXTENDED_RELEASE_TABLET | Freq: Once | ORAL | Status: DC

## 2017-09-30 MED ORDER — HALOPERIDOL 0.5 MG PO TABS
0.5000 mg | ORAL_TABLET | ORAL | Status: DC | PRN
  Filled 2017-09-30: qty 1

## 2017-09-30 MED ORDER — LORAZEPAM 2 MG/ML IJ SOLN: 1.0000 mg | INTRAMUSCULAR | Status: DC | PRN

## 2017-09-30 MED ORDER — SODIUM CHLORIDE 0.9% FLUSH: 3.0000 mL | INTRAVENOUS | Status: DC | PRN

## 2017-09-30 MED ORDER — MORPHINE SULFATE (CONCENTRATE) 10 MG/0.5ML PO SOLN: 5.0000 mg | ORAL | Status: AC | PRN

## 2017-09-30 MED ORDER — BIOTENE DRY MOUTH MT LIQD: 15.0000 mL | OROMUCOSAL | Status: DC | PRN

## 2017-09-30 MED ORDER — ACETAMINOPHEN 650 MG RE SUPP: 650.0000 mg | Freq: Four times a day (QID) | RECTAL | Status: DC | PRN

## 2017-09-30 MED ORDER — MORPHINE SULFATE (CONCENTRATE) 10 MG/0.5ML PO SOLN: 5.0000 mg | ORAL | Status: DC | PRN

## 2017-09-30 MED ORDER — GLYCOPYRROLATE 1 MG PO TABS: 1.0000 mg | ORAL_TABLET | ORAL | Status: AC | PRN

## 2017-09-30 MED ORDER — POLYVINYL ALCOHOL 1.4 % OP SOLN: 1.0000 [drp] | Freq: Four times a day (QID) | OPHTHALMIC | Status: DC | PRN

## 2017-09-30 MED ORDER — HALOPERIDOL LACTATE 2 MG/ML PO CONC: 0.5000 mg | ORAL | Status: DC | PRN

## 2017-09-30 MED ORDER — LORAZEPAM 1 MG PO TABS: 1.0000 mg | ORAL_TABLET | ORAL | Status: DC | PRN

## 2017-09-30 MED ORDER — POTASSIUM CHLORIDE 10 MEQ/100ML IV SOLN
10.0000 meq | INTRAVENOUS | Status: DC
  Administered 2017-09-30: 10 meq via INTRAVENOUS
  Filled 2017-09-30: qty 100

## 2017-09-30 MED ORDER — ACETAMINOPHEN 325 MG PO TABS
650.0000 mg | ORAL_TABLET | Freq: Four times a day (QID) | ORAL | Status: DC | PRN
Start: 2017-09-30 — End: 2017-09-30

## 2017-09-30 MED ORDER — LORAZEPAM 2 MG/ML PO CONC: 1.0000 mg | ORAL | Status: DC | PRN

## 2017-09-30 MED ORDER — ONDANSETRON HCL 4 MG/2ML IJ SOLN: 4.0000 mg | Freq: Four times a day (QID) | INTRAMUSCULAR | Status: DC | PRN

## 2017-09-30 MED ORDER — GLYCOPYRROLATE 1 MG PO TABS: 1.0000 mg | ORAL_TABLET | ORAL | Status: DC | PRN

## 2017-09-30 MED ORDER — SODIUM CHLORIDE 0.9 % IV SOLN: 250.0000 mL | INTRAVENOUS | Status: DC | PRN

## 2017-09-30 MED ORDER — SODIUM CHLORIDE 0.9% FLUSH: 3.0000 mL | Freq: Two times a day (BID) | INTRAVENOUS | Status: DC

## 2017-09-30 MED ORDER — ONDANSETRON 4 MG PO TBDP: 4.0000 mg | ORAL_TABLET | Freq: Four times a day (QID) | ORAL | Status: DC | PRN

## 2017-09-30 NOTE — Progress Notes (Signed)
Patient ID: [name] [name], female   DOB: 1941/03/13, 76 y.o.   MRN: [id number] I was able to come to the bedside and examined the patient.  At her last hospitalization earlier this month we found just a superficial fluid collection that did not tracked down to the joint.  We were able to easily I&D this area.  Since then she has had her sutures removed.  On clinical exam the incision is healed completely at her right hip.  There is no induration or fluctuance.  There is no redness.  I did review the CT scan of the abdomen and pelvis and saw the fluid collection in the soft tissue.  This appears to be postoperative changes.  I did aspirate the soft tissue around her right hip at the bedside and easily withdrew all the fluid.  The fluid was consistent with a postoperative seroma and did not appear to be consistent with any type of infection at all.  She tolerated this easily.  No surgery is indicated at this standpoint.  Do call with any questions or concerns.

## 2017-09-30 NOTE — Progress Notes (Signed)
[facility] Liaison: RN visit  Received request from [place]  for family interest in [facility] with request for transfer today. Chart reviewed. Met husband to confirm interest and explain services. Husband is agreeable to transfer today. CSW aware. Registration paper work completed. Dr. Orpah Melter to assume care per family request. Please fax discharge summary to [phone]. RN please call report to [phone]. Please arrange transport for patient to arrive as soon as possible.   Please call with any hospice related questions.   Thank you.  Farrel Gordon, RN, [facility] Liaison [phone]  All hospital liaisons are on [place].

## 2017-09-30 NOTE — Progress Notes (Signed)
CRITICAL VALUE ALERT  Critical Value:  mg  Date & Time Notied:  09/20/2017 0845  Provider Notified: yes [place]al[place]  Orders Received/Actions taken: mgso4  4 mg orderes

## 2017-09-30 NOTE — Progress Notes (Addendum)
Daily Progress Note   Patient Name: [name] [name]       Date: 09/17/2017 DOB: 09-Oct-1941  Age: 76 y.o. MRN#: [id number] Attending Physician: Modena Jansky, MD Primary Care Physician: [name], MD Admit Date: 09/28/2017  Reason for Consultation/Follow-up: Establishing goals of care  Subjective: Patient unable to speak.  Husband at bedside.  Crying, he told me about trying to move her and she ended up on the floor.  He is unable to care for her at home any longer.  He realizes she is dying.  We talked for awhile about their meeting and growing up together here in [place].  We discussed the need to shift goals and move forward with comfort care.  Husband is agreeable.  We discussed hospice house.  He is grateful for a place that will offer her specialized care for her stage in life.   Assessment: Very fragile 76 yo female with recurrent encephalopathy with no obvious cause.  Recent history of frequent seizures, hx of intercerebral hemorrhage years ago.  Recent hip fracture and post op infection.   Now actively dying.  Unable to communicate, recognize her husband, eat, or mobilize.   Patient Profile/HPI: 76 y.o. female  with past medical history of CKD 3, CVA, DM, Seizure, hip fracture (9/25), and essentially bed bound status who was admitted on 09/28/2017 with acute encephalopathy.  Mrs. Bubb has had 5 recent admissions with a similar presentation on the last two admissions.  She has had bacteremia and a recent ID at the site of her hip repair on 11/4.  This admission, initial work up does not reveal a clear source of encephalopathy.  Neurology has been consulted.  She does have diffuse slowing on her EEG.  Length of Stay: 2  Current Medications: Scheduled Meds:  . sodium chloride  flush  3 mL Intravenous Q12H    Continuous Infusions: . sodium chloride    . levETIRAcetam Stopped (09/10/2017 1008)  . magnesium sulfate 1 - 4 g bolus IVPB      PRN Meds: sodium chloride, acetaminophen **OR** acetaminophen, antiseptic oral rinse, glycopyrrolate **OR** glycopyrrolate **OR** glycopyrrolate, haloperidol **OR** haloperidol **OR** haloperidol lactate, LORazepam **OR** [DISCONTINUED] LORazepam **OR** LORazepam, morphine CONCENTRATE **OR** morphine CONCENTRATE, ondansetron **OR** ondansetron (ZOFRAN) IV, polyvinyl alcohol, sodium chloride flush  Physical Exam  Elderly female, not responsive to me.  Involuntary tremor  Resp no distress Cv:  Tachy and irregular Abdomen soft, non distended Extremities 2-3+ edema  Vital Signs: BP (!) 154/62 (BP Location: Right Arm)   Pulse (!) 114   Temp 98 F (36.7 C) (Axillary)   Resp 12   Ht 5\' 4"  (1.626 m)   Wt 60.3 kg (132 lb 15 oz)   SpO2 100%   BMI 22.82 kg/m  SpO2: SpO2: 100 % O2 Device: O2 Device: Not Delivered O2 Flow Rate:    Intake/output summary:   Intake/Output Summary (Last 24 hours) at 09/08/2017 1149 Last data filed at 09/22/2017 0900 Gross per 24 hour  Intake 2461.25 ml  Output 900 ml  Net 1561.25 ml   LBM:   Baseline Weight: Weight: 57.6 kg (127 lb) Most recent weight: Weight: 60.3 kg (132 lb 15 oz)       Palliative Assessment/Data: 10%    Flowsheet Rows     Most Recent Value  Intake Tab  Referral Department  Hospitalist  Unit at Time of Referral  Med/Surg Unit  Palliative Care Primary Diagnosis  Sepsis/Infectious Disease  Date Notified  09/29/17  Palliative Care Type  Return patient Palliative Care  Reason for referral  Clarify Goals of Care  Date of Admission  09/28/17  Date first seen by Palliative Care  09/29/17  # of days Palliative referral response time  0 Day(s)  # of days IP prior to Palliative referral  1  Clinical Assessment  Psychosocial & Spiritual Assessment  Palliative Care  Outcomes      Patient Active Problem List   Diagnosis Date Noted  . Hypotension 09/24/2017  . Chronic diastolic CHF (congestive heart failure) ([place]) 09/24/2017  . Fever   . Purulent dermatitis   . Rhonchi   . Palliative care encounter   . Encounter for hospice care discussion   . DNR (do not resuscitate) discussion   . Postoperative wound infection of right hip   . Status epilepticus ([place]) 08/26/2017  . S/P right hip fracture 08/26/2017  . Status post total hip replacement, right 08/10/2017  . Closed right hip fracture, initial encounter ([place]) 07/27/2017  . Hip fracture ([place]) 07/27/2017  . Pressure injury of skin 06/14/2017  . Encounter for palliative care   . Goals of care, counseling/discussion   . CAP (community acquired pneumonia) 06/12/2017  . Sepsis ([place]) 06/11/2017  . Weakness 06/11/2017  . Anemia 06/11/2017  . Acute kidney injury superimposed on chronic kidney disease ([place]) 06/05/2017  . Sepsis, unspecified organism ([place]) 06/05/2017  . Partial symptomatic epilepsy with complex partial seizures, not intractable, without status epilepticus ([place])   . Bacteremia due to Escherichia coli 11/18/2016  . Renal insufficiency   . Aortic atherosclerosis ([place]) 11/15/2016  . UTI (urinary tract infection) 11/15/2016  . Paranoia ([place]) 05/24/2015  . Tachycardia 07/02/2013  . Hypophosphatemia 07/02/2013  . Encounter for central line placement 07/02/2013  . Hypokalemia 07/01/2013  . Hypomagnesemia 07/01/2013  . Protein-calorie malnutrition, severe ([place]) 06/29/2013  . Leukocytosis, unspecified 06/29/2013  . 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 TIA (transient ischemic attack) 12/13/2012  . 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 Loss of consciousness ([place]) 09/08/2012  . Acute respiratory failure with hypoxia ([place]) 08/20/2012  . Seizure disorder, grand mal ([place]) 08/20/2012  . Seizure ([place]) 08/20/2012  . CKD (chronic kidney disease) stage 3, GFR 30-59 ml/min  (HCC) 08/19/2012  . Leukocytosis 08/19/2012  . Hemorrhage in the brain-13 x  22 x 14 mm right posterior temporo-occipital intra-axial acute 08/19/2012  . Left hip pain 08/19/2012  . Left hemiparesis ([place]) 08/19/2012  . Hemianopia, homonymous, left 08/19/2012  . Hemisensory deficit, left 08/19/2012  . Nausea 08/19/2012  . Headache(784.0) 08/19/2012  . Acute encephalopathy 08/19/2012  . Hemorrhage of brain, nontraumatic ([place]) 08/18/2012  . Dysphagia 08/18/2012  . HTN (hypertension) 08/18/2012  . Diabetes mellitus with complication ([place]) 21/19/4174  . Hypoglycemia due to insulin 07/26/2012  . GERD (gastroesophageal reflux disease)     Palliative Care Plan    Recommendations/Plan:  DC to hospice house when bed available.  Will shift orders to comfort and d/c interventions that do not focus on comfort.  Goals of Care and Additional Recommendations:  Limitations on Scope of Treatment: Full Comfort Care  Code Status:  DNR  Prognosis:  Days to less than 2 weeks.  No longer interactive, no longer eating.  Discharge Planning:  Hospice facility  Care plan was discussed with husband at bedside, attending MD, Hospice Liasion  Thank you for allowing the Palliative Medicine Team to assist in the care of this patient.  Total time spent:  60 min.     Greater than 50%  of this time was spent counseling and coordinating care related to the above assessment and plan.  Florentina Jenny, PA-C Palliative Medicine  Please contact Palliative MedicineTeam phone at [phone] for questions and concerns between 7 am - 7 pm.   Please see AMION for individual provider pager numbers.

## 2017-09-30 NOTE — Progress Notes (Addendum)
Patient has been accepted by [facility] and is able to discharge there today. Patient's spouse very tearful but appreciative of the hospital's care and help with this process.  [name] [name] Charish Schroepfer LCSWA [phone]

## 2017-09-30 NOTE — Discharge Summary (Signed)
Physician Discharge Summary  [name] [name] QAS:[id number] DOB: Oct 22, 1941  PCP: [name], MD  Admit date: 09/28/2017 Discharge date: 09/10/2017  Recommendations for Outpatient Follow-up:  1. Patient is being discharged to [facility].  Home Health: None Equipment/Devices: None    Discharge Condition: Guarded and at risk for continued decline and death.  CODE STATUS: DO NOT RESUSCITATE  Diet recommendation: Regular diet/comfort feeds of choice.  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  Patient was living at home with husband (mild dementia, had given up driving/cooking, but otherwise independent and no memory loss) until August of this year, she was admitted with sepsis, found to have E coli bacteremia in 1/2 cultures.  (Interestingly, she had had 1/2 BCx positive for E coli in January 2018 as well.)  Since that hospitalization, she has failed to thrive.  Was readmitted for weakness, then for hip fracture, then for status epilepticus from sepsis from superficial wound infection requiring operative debridement, and finally most recently this past week for syncope.   During her last hospitalization, she had recovered enough that she was able to discharge home. per husband she was interactive and her normal self, just weak the first night home, but by morning she was sluggish, weak, and poorly responsive, so he brought her back to the hospital.    Outline of recent admissions: 8/4 admitted with fever chills  tachy 103F WBC 15K --> E coli bacteremia sepsis, only 1/2 BCx + for E coli, Vanc/Zosyn to CTX, discharged w/ HH and cephalexin, d/c 8/9  8/9 day of d/c, couldn't get in door after d/c, readmitted, 4 more days CTX, d/c to SNF on 8/13 for 4 weeks, completed cephalexin, total 10 days   9/24 home 10d from SNF, fell in toilet, broke hip, repaired 9/25 by Dr. Ninfa Linden, d/c 9/27  10/24 readmitted with status epilepticus, seizures 45 minutes, stopped with Keppra, continuous EEG negative (of note, first seizure ever was in 2014, after her 2013 stroke, only had the one seizure ever); LP normal; delirium for 10 days, slowly cleared; this hosp c/b superficial cellulitis at surg site --> ultimately required debridement of the right hip on 11/4, but the infection did not extend to hip joint  (FINDINGS: Evidence of a large seroma and adipose tissue and necrosis with no significant purulence and no penetration deep to the joint.), after which delirium cleared; Cortrak, advanced diet and discharegd without tyube on 11/9  Abx (CTx 2 d to Vanc/Zosyn for 5 10/24 to 11/1, then 11/3 restarted for fever, vanc/zosyn 7 days, d/cd on Bactrim for superficial infxn)  11/21  Micturition syncope, lactate 7.8, empiric vanc/Zosyn, MRI brain neg (got 2doses vanc/Zosyn, 2 doses Levaquin during htat hosp, total 4d)  Fever curve: 10/24 104F 11/3 103F 11/8 101F 11/26 102F   Culture data: 11/21 BCx x2 neg 11/21 UCx neg 11/8 BCx x2 neg 11/4 op culture neg 11/3 BCx neg x2 10/25 CSF Cx neg 10/25 UCx neg 10/24 BCx x2 neg 8/9 UCx insig growth 8/9 BCx x2 neg 8/3 UCx insig growth 8/3  BCx 1/2 E coli pan susc 1/13 BCx 1/2 E coli pan susc 1/13 UCx 60K E coli   Interestingly 1/13-1/27 admission for "syncope" and 1/2 Ecoli bacteremia, treated with meropenem, d/c'd Keflex  Assessment & Plan:   1. Probable sepsis vs possible fever unknown origin Since August, the patient has had recurrent fevers, accompanied by  delirium/AMS and sometimes seizures.  She has had two documented infections (1/2 E coli bacteremia in index admission in August, and then soft tissue infection of right hip in early November), and so LIKELY this fever does represent sepsis/infection.   - Fever workup was initiated. Urine culture negative. Blood culture negative to date. FUO workup was also initiated. Patient was empirically started on IV vancomycin and Zosyn. - CT chest abdomen and pelvis were obtained to look for occult source of infection. There were no significant acute findings in the chest, abdomen or pelvis. He did report an elongated rim-enhancing fluid collection over the right hip suspicious for an abscess and aspiration was recommended. - Orthopedics evaluated her at bedside today. They felt that the findings seen on imaging were postoperative changes. They did aspirate the soft tissue around her right hip at the bedside and easily withdrew all the fluid which was consistent with postoperative seroma and did not appear to be consistent with any type of infection at all. No surgery was indicated at this standpoint. - Due to multiple recent hospitalizations for acute illness and general failure to thrive, palliative care was consulted. They met with patient and spouse at bedside today. Spouse realizes that patient was dying. At this time determination was made to transition her to comfort care and patient is to be discharged to residential hospice for EOL care. I personally discussed with patient's spouse at bedside and he is in full agreement with this plan. Comforted him. - Palliative care team discontinued all medications that were not essential to comfort and initiated dose that are needed for comfort.   2. Seizures First and only seizure prior to last month was 2014 per husband.  He describes her seizures as just being related to her 2013 hemorrhagic stroke.  She is on home Keppra recently, was admitted for suspected status  in Oct.  EEG shows nothing specific for seizure. - Neurology was consulted. They felt that she had toxic metabolic encephalopathy, recrudescence of old stroke symptoms and possible breakthrough seizures. Continue oral Keppra until patient is able to take it. When necessary Ativan for breakthrough seizures.  3. Hypertension, CV disease secondary prevention - All medications not pertinent to comfort care were discontinued by palliative care team.  4. Chronic kidney disease stage III Baseline Cr 1.3, near baseline  6. Diabetes -Insulins were discontinued.  7. Adult failure to thrive Now transitioned to full comfort care.  8. Anemia of chronic disease.  9. Hypokalemia & hypomagnesemia.  Consultants:   Neuro  Palliative care medicine.  Procedures:   EEG     Discharge Instructions  Discharge Instructions    Call MD for:  difficulty breathing, headache or visual disturbances   Complete by:  As directed    Call MD for:  persistant nausea and vomiting   Complete by:  As directed    Call MD for:  severe uncontrolled pain   Complete by:  As directed    Call MD for:  temperature >100.4   Complete by:  As directed    Diet general   Complete by:  As directed    Comfort feeds of choice.  Increase activity slowly   Complete by:  As directed        Medication List    STOP taking these medications   aspirin 325 MG EC tablet   atorvastatin 10 MG tablet Commonly known as:  LIPITOR   fludrocortisone 0.1 MG tablet Commonly known as:  FLORINEF   LEVEMIR FLEXTOUCH 100 UNIT/ML Pen Generic drug:  Insulin Detemir   Magnesium 500 MG Tabs   metoprolol succinate 25 MG 24 hr tablet Commonly known as:  TOPROL-XL   multivitamin capsule   NOVOLOG FLEXPEN 100 UNIT/ML FlexPen Generic drug:  insulin aspart   omeprazole 20 MG capsule Commonly known as:  PRILOSEC   ondansetron 4 MG disintegrating tablet Commonly known as:  ZOFRAN ODT   vitamin C 500 MG  tablet Commonly known as:  ASCORBIC ACID   ZINC OXIDE (TOPICAL) 10 % Crea     TAKE these medications   glycopyrrolate 1 MG tablet Commonly known as:  ROBINUL Take 1 tablet (1 mg total) by mouth every 4 (four) hours as needed (excessive secretions).   levETIRAcetam 100 MG/ML solution Commonly known as:  KEPPRA Take 5 mLs (500 mg total) 2 (two) times daily by mouth.   LORazepam 1 MG tablet Commonly known as:  ATIVAN Take 1 tablet (1 mg total) by mouth every 4 (four) hours as needed for anxiety or seizure.   morphine CONCENTRATE 10 MG/0.5ML Soln concentrated solution Place 0.25 mLs (5 mg total) under the tongue every 2 (two) hours as needed for moderate pain or severe pain (or dyspnea).      Follow-up Information    Little, Jeneen Rinks, MD. Schedule an appointment as soon as possible for a visit.   Specialty:  Family Medicine Why:  as needed. Contact information: [address] [place] [address] [phone]          Allergies  Allergen Reactions  . Barbiturates Other (See Comments)    Restlessness  . Other Other (See Comments)    "Strong meds" ("like sleeping pills") = Restlessness  . Latex Itching      Procedures/Studies: Ct Head Wo Contrast  Result Date: 09/28/2017 CLINICAL DATA:  Altered mental status. EXAM: CT HEAD WITHOUT CONTRAST TECHNIQUE: Contiguous axial images were obtained from the base of the skull through the vertex without intravenous contrast. COMPARISON:  MRI brain dated September 24, 2017. CT head dated September 23, 2017. FINDINGS: Brain: No evidence of acute infarction, hemorrhage, hydrocephalus, extra-axial collection or mass lesion/mass effect. Unchanged diffuse cerebral atrophy and ex vacuo dilatation of the ventricles. 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 Vascular: No hyperdense vessel or unexpected calcification. Skull: Normal. Negative for fracture or focal lesion. Sinuses/Orbits: No acute finding. Other: None. IMPRESSION: 1.  No acute intracranial abnormality. Stable cerebral atrophy and chronic microvascular ischemic white matter disease. Electronically Signed   By: Titus Dubin M.D.   On: 09/28/2017 13:56   Ct Chest W Contrast  Result Date: 09/29/2017 CLINICAL DATA:  Unresolved pneumonia and abdominal infection. Fever, chills and elevated white blood cell count. EXAM: CT CHEST, ABDOMEN, AND PELVIS WITH CONTRAST TECHNIQUE: Multidetector CT imaging of the chest, abdomen and pelvis was performed following the standard protocol during bolus administration of intravenous contrast. CONTRAST:  79m ISOVUE-300 IOPAMIDOL (ISOVUE-300) INJECTION 61% COMPARISON:  CT scan 01/11/2008 and abdominal ultrasound examination 06/06/2017 FINDINGS: CT CHEST FINDINGS Cardiovascular: The heart is within normal limits in size for age. No pericardial effusion. Fluid noted in the pericardial recesses. The aorta is normal in caliber.  There is tortuosity, mild ectasia and scattered atherosclerotic calcifications. In scattered coronary artery calcifications are also noted. Mediastinum/Nodes: No mass or adenopathy. The esophagus is grossly normal. Lungs/Pleura: Dense biapical pleural and parenchymal scarring type changes. Patchy areas of bronchiectasis are noted. No interstitial lung disease. Bibasilar subpleural atelectasis but no definite infiltrates or edema. No worrisome pulmonary lesions. Musculoskeletal: No breast masses, supraclavicular or axillary lymphadenopathy. The thyroid gland is grossly normal. No significant bony findings. CT ABDOMEN PELVIS FINDINGS Hepatobiliary: No focal hepatic lesions or intrahepatic biliary dilatation. Gallbladder is mildly distended but no definite CT findings for acute cholecystitis. Normal caliber common bile duct. Pancreas: Severe pancreatic atrophy but no mass, inflammation or ductal dilatation. Spleen: Normal size.  No focal lesions. Adrenals/Urinary Tract: The adrenal glands are unremarkable. Stable large complex  hyperdense/hemorrhagic cyst associated with the left kidney. Other smaller cysts are noted bilaterally. No worrisome renal lesions or hydronephrosis. No obvious bladder lesions. Stomach/Bowel: The stomach, duodenum, small bowel and colon are grossly normal without oral contrast. No inflammatory changes, mass lesions or obstructive findings. The terminal ileum and appendix are normal. Vascular/Lymphatic: The aorta is normal in caliber. No dissection. Moderate scattered atherosclerotic calcifications. The branch vessels are patent. The major venous structures are patent. No mesenteric or retroperitoneal mass or adenopathy. Small scattered lymph nodes are noted. Reproductive: Surgically absent. Other: No pelvic mass or adenopathy. No free pelvic fluid collections. No inguinal mass or adenopathy. No abdominal wall hernia or subcutaneous lesions. Musculoskeletal: There is a total right hip arthroplasty with significant artifact. There is a elongated rim enhancing fluid collection superficial to the muscular fascia overlying the right hip. This measures 9.7 x 4.1 x 1.6 cm and could be an abscess. IMPRESSION: 1. No significant acute findings in the chest, abdomen or pelvis. 2. Elongated rim enhancing fluid collection overlying the right hip suspicious for an abscess. Recommend aspiration. Electronically Signed   By: Marijo Sanes M.D.   On: 09/29/2017 11:22   Ct Abdomen Pelvis W Contrast  Result Date: 09/29/2017 CLINICAL DATA:  Unresolved pneumonia and abdominal infection. Fever, chills and elevated white blood cell count. EXAM: CT CHEST, ABDOMEN, AND PELVIS WITH CONTRAST TECHNIQUE: Multidetector CT imaging of the chest, abdomen and pelvis was performed following the standard protocol during bolus administration of intravenous contrast. CONTRAST:  41m ISOVUE-300 IOPAMIDOL (ISOVUE-300) INJECTION 61% COMPARISON:  CT scan 01/11/2008 and abdominal ultrasound examination 06/06/2017 FINDINGS: CT CHEST FINDINGS  Cardiovascular: The heart is within normal limits in size for age. No pericardial effusion. Fluid noted in the pericardial recesses. The aorta is normal in caliber. There is tortuosity, mild ectasia and scattered atherosclerotic calcifications. In scattered coronary artery calcifications are also noted. Mediastinum/Nodes: No mass or adenopathy. The esophagus is grossly normal. Lungs/Pleura: Dense biapical pleural and parenchymal scarring type changes. Patchy areas of bronchiectasis are noted. No interstitial lung disease. Bibasilar subpleural atelectasis but no definite infiltrates or edema. No worrisome pulmonary lesions. Musculoskeletal: No breast masses, supraclavicular or axillary lymphadenopathy. The thyroid gland is grossly normal. No significant bony findings. CT ABDOMEN PELVIS FINDINGS Hepatobiliary: No focal hepatic lesions or intrahepatic biliary dilatation. Gallbladder is mildly distended but no definite CT findings for acute cholecystitis. Normal caliber common bile duct. Pancreas: Severe pancreatic atrophy but no mass, inflammation or ductal dilatation. Spleen: Normal size.  No focal lesions. Adrenals/Urinary Tract: The adrenal glands are unremarkable. Stable large complex hyperdense/hemorrhagic cyst associated with the left kidney. Other smaller cysts are noted bilaterally. No worrisome renal lesions or hydronephrosis. No obvious bladder lesions. Stomach/Bowel: The  stomach, duodenum, small bowel and colon are grossly normal without oral contrast. No inflammatory changes, mass lesions or obstructive findings. The terminal ileum and appendix are normal. Vascular/Lymphatic: The aorta is normal in caliber. No dissection. Moderate scattered atherosclerotic calcifications. The branch vessels are patent. The major venous structures are patent. No mesenteric or retroperitoneal mass or adenopathy. Small scattered lymph nodes are noted. Reproductive: Surgically absent. Other: No pelvic mass or adenopathy. No  free pelvic fluid collections. No inguinal mass or adenopathy. No abdominal wall hernia or subcutaneous lesions. Musculoskeletal: There is a total right hip arthroplasty with significant artifact. There is a elongated rim enhancing fluid collection superficial to the muscular fascia overlying the right hip. This measures 9.7 x 4.1 x 1.6 cm and could be an abscess. IMPRESSION: 1. No significant acute findings in the chest, abdomen or pelvis. 2. Elongated rim enhancing fluid collection overlying the right hip suspicious for an abscess. Recommend aspiration. Electronically Signed   By: Marijo Sanes M.D.   On: 09/29/2017 11:22   Dg Chest Port 1 View  Result Date: 09/28/2017 CLINICAL DATA:  Encephalopathy, was conversing last night at 2200 hours, not speaking this morning, difficulty waking patient, shortness of breath, history hypertension, seizures, type II diabetes mellitus EXAM: PORTABLE CHEST 1 VIEW COMPARISON:  Portable exam 1108 hours compared to 09/23/2017 FINDINGS: Normal heart size, mediastinal contours, and pulmonary vascularity. Atherosclerotic calcification aorta. Lungs mildly hyperinflated with biapical scarring. No acute infiltrate, pleural effusion or pneumothorax. Diffuse osseous demineralization. IMPRESSION: Emphysematous changes with biapical scarring. No acute abnormalities. Electronically Signed   By: Lavonia Dana M.D.   On: 09/28/2017 11:30    Subjective: Patient lying comfortably propped up in bed. She is drowsy, but briefly opens eyes but drifts back to sleep. Nonverbal. Does not follow instructions. As per spouse at bedside, is not in any pain or discomfort.  Discharge Exam:  Vitals:   09/29/17 2206 09/22/2017 0629 09/29/2017 0629 09/25/2017 0907  BP: (!) 146/61  (!) 154/62   Pulse: (!) 114  (!) 116 (!) 114  Resp: 16   12  Temp:  99.1 F (37.3 C)  98 F (36.7 C)  TempSrc:  Axillary  Axillary  SpO2: 97%  99% 100%  Weight:      Height:        General: Elderly female, moderately  built, frail, chronically ill-looking lying comfortably propped up in bed. She does not appear in any distress. Cardiovascular: S1 & S2 heard, RRR, S1/S2 +. No murmurs, rubs, gallops or clicks. No JVD. 1+ pitting edema of all extremities. Respiratory: Poor inspiratory effort. Reduced breath sounds in the bases but no wheezing, rhonchi or crackles. No increased work of breathing. Abdominal:  Non distended, non tender & soft. No organomegaly or masses appreciated. Normal bowel sounds heard. CNS: Mental status as indicated above. No focal neurological deficits. Extremities: no edema, no cyanosis    The results of significant diagnostics from this hospitalization (including imaging, microbiology, ancillary and laboratory) are listed below for reference.     Microbiology: Recent Results (from the past 240 hour(s))  Blood Culture (routine x 2)     Status: None   Collection Time: 09/23/17  7:50 PM  Result Value Ref Range Status   Specimen Description BLOOD LEFT HAND  Final   Special Requests IN PEDIATRIC BOTTLE Blood Culture adequate volume  Final   Culture NO GROWTH 5 DAYS  Final   Report Status 09/28/2017 FINAL  Final  Urine culture     Status: None  Collection Time: 09/23/17  8:09 PM  Result Value Ref Range Status   Specimen Description URINE, CATHETERIZED  Final   Special Requests NONE  Final   Culture NO GROWTH  Final   Report Status 09/25/2017 FINAL  Final  Blood Culture (routine x 2)     Status: None   Collection Time: 09/23/17  8:23 PM  Result Value Ref Range Status   Specimen Description BLOOD LEFT HAND  Final   Special Requests IN PEDIATRIC BOTTLE Blood Culture adequate volume  Final   Culture NO GROWTH 5 DAYS  Final   Report Status 09/28/2017 FINAL  Final  MRSA PCR Screening     Status: None   Collection Time: 09/24/17  1:42 AM  Result Value Ref Range Status   MRSA by PCR NEGATIVE NEGATIVE Final    Comment:        The GeneXpert MRSA Assay (FDA approved for NASAL  specimens only), is one component of a comprehensive MRSA colonization surveillance program. It is not intended to diagnose MRSA infection nor to guide or monitor treatment for MRSA infections.   Blood Culture (routine x 2)     Status: None (Preliminary result)   Collection Time: 09/28/17 11:40 AM  Result Value Ref Range Status   Specimen Description BLOOD LEFT HAND  Final   Special Requests   Final    BOTTLES DRAWN AEROBIC AND ANAEROBIC Blood Culture adequate volume   Culture NO GROWTH 1 DAY  Final   Report Status PENDING  Incomplete  Urine culture     Status: None   Collection Time: 09/28/17  5:21 PM  Result Value Ref Range Status   Specimen Description URINE, RANDOM  Final   Special Requests NONE  Final   Culture NO GROWTH  Final   Report Status 09/29/2017 FINAL  Final     Labs: CBC: Recent Labs  Lab 09/23/17 1946 09/23/17 1950 09/28/17 1140 09/29/17 0331 09/13/2017 0505  WBC  --  9.9 10.4 9.5 9.8  NEUTROABS  --  6.8 6.6  --   --   HGB 14.6 12.2 12.9 11.2* 10.5*  HCT 43.0 38.4 40.2 35.5* 33.0*  MCV  --  100.5* 97.6 97.0 99.4  PLT  --  251 212 159 325   Basic Metabolic Panel: Recent Labs  Lab 09/24/17 0232  09/26/17 0212 09/27/17 0230 09/28/17 1140 09/29/17 0331 09/18/2017 0505  NA  --    < > 142 138 139 139 141  K  --    < > 3.5 3.0* 3.8 3.6 2.9*  CL  --    < > 113* 106 104 102 107  CO2  --    < > 21* '24 26 27 24  ' GLUCOSE  --    < > 172* 139* 212* 197* 137*  BUN  --    < > 7 <5* <5* <5* <5*  CREATININE  --    < > 1.36* 1.22* 1.30* 1.24* 1.35*  CALCIUM  --    < > 8.3* 8.3* 8.7* 7.9* 7.5*  MG 1.5*  --   --   --   --   --  0.7*   < > = values in this interval not displayed.   Liver Function Tests: Recent Labs  Lab 09/23/17 1950 09/28/17 1140  AST 55* 31  ALT 22 17  ALKPHOS 164* 130*  BILITOT 0.6 0.5  PROT 6.9 7.2  ALBUMIN 2.5* 2.7*   BNP (last 3 results) No results for input(s): BNP in  the last 8760 hours. Cardiac Enzymes: Recent Labs  Lab  09/28/17 1921 09/29/17 0331  CKTOTAL 26* 38   CBG: Recent Labs  Lab 09/29/17 0745 09/29/17 1319 09/29/17 1657 09/29/17 2210 09/28/2017 0802  GLUCAP 174* 128* 137* 123* 145*   Urinalysis    Component Value Date/Time   COLORURINE STRAW (A) 09/28/2017 1727   APPEARANCEUR HAZY (A) 09/28/2017 1727   LABSPEC 1.010 09/28/2017 1727   PHURINE 6.0 09/28/2017 1727   GLUCOSEU 50 (A) 09/28/2017 1727   HGBUR SMALL (A) 09/28/2017 1727   BILIRUBINUR NEGATIVE 09/28/2017 1727   BILIRUBINUR neg 08/16/2012 1531   KETONESUR 5 (A) 09/28/2017 1727   PROTEINUR NEGATIVE 09/28/2017 1727   UROBILINOGEN 0.2 06/25/2013 1002   NITRITE NEGATIVE 09/28/2017 1727   LEUKOCYTESUR LARGE (A) 09/28/2017 1727    Discussed in detail with patient's spouse at bedside. Answered questions and updated care. Comforted him.  Time coordinating discharge: Over 30 minutes  SIGNED:  Vernell Leep, MD, FACP, [facility]. [facility] Pager [phone]  If 7PM-7AM, please contact night-coverage [email or web address] Password [facility] 09/18/2017, 12:53 PM

## 2017-09-30 NOTE — Progress Notes (Signed)
Patient will DC to: [facility] Anticipated DC date: 09/23/2017 Family notified: Spouse Transport by: Corey Harold   Per MD patient ready for DC to [facility]. RN, patient, patient's family, and facility notified of DC. Discharge Summary sent to facility. RN given number for report [phone]). DC packet on chart. Ambulance transport requested for patient.   CSW signing off.  Cedric Fishman, [place] Social Worker [phone]

## 2017-09-30 NOTE — Progress Notes (Signed)
Visited with spouse and patient who is cloe to dying.  Equipment was removed earlier in the day. Patient is preparing to go to Hospice. Husband very tearful and not looking forward to life with out the love of his life. Had conversation-listened to his concerns, asked if she had favorite hymns and he said How [place] Thou Art. Sang the song and offered comfort to patient. Prayer for patient and husband [name].Conard Novak, Chaplain   09/11/2017 1300  Clinical Encounter Type  Visited With Patient and family together  Visit Type Initial;Spiritual support;Social support;Other (Comment)  Referral From Nurse  Consult/Referral To Chaplain  Spiritual Encounters  Spiritual Needs Literature;Prayer;Emotional  Stress Factors  Patient Stress Factors Not reviewed  Family Stress Factors Loss;Other (Comment) (Spouse struggling with coming death)

## 2017-09-30 NOTE — Progress Notes (Signed)
IV access was d/c'd. Vitals are stable. Skin is intact except as charted in most recent assessments. Pt to be escorted out by [facility] to go to [facility]. Report given to Nurse at [facility]. Pt had a BM and was cleaned. Her VSS were very altered and Nurse made aware.

## 2017-10-03 LAB — CULTURE, BLOOD (ROUTINE X 2)
Culture: NO GROWTH
Special Requests: ADEQUATE

## 2017-10-03 DEATH — deceased

## 2017-12-15 IMAGING — DX DG CHEST 1V PORT
1 series · 1 of 1 positions shown · non-contrast
Comparison: 08/26/2017

CLINICAL DATA: 76-year-old female with a history of respiratory
failure

EXAM:
PORTABLE CHEST 1 VIEW

[chest ap]
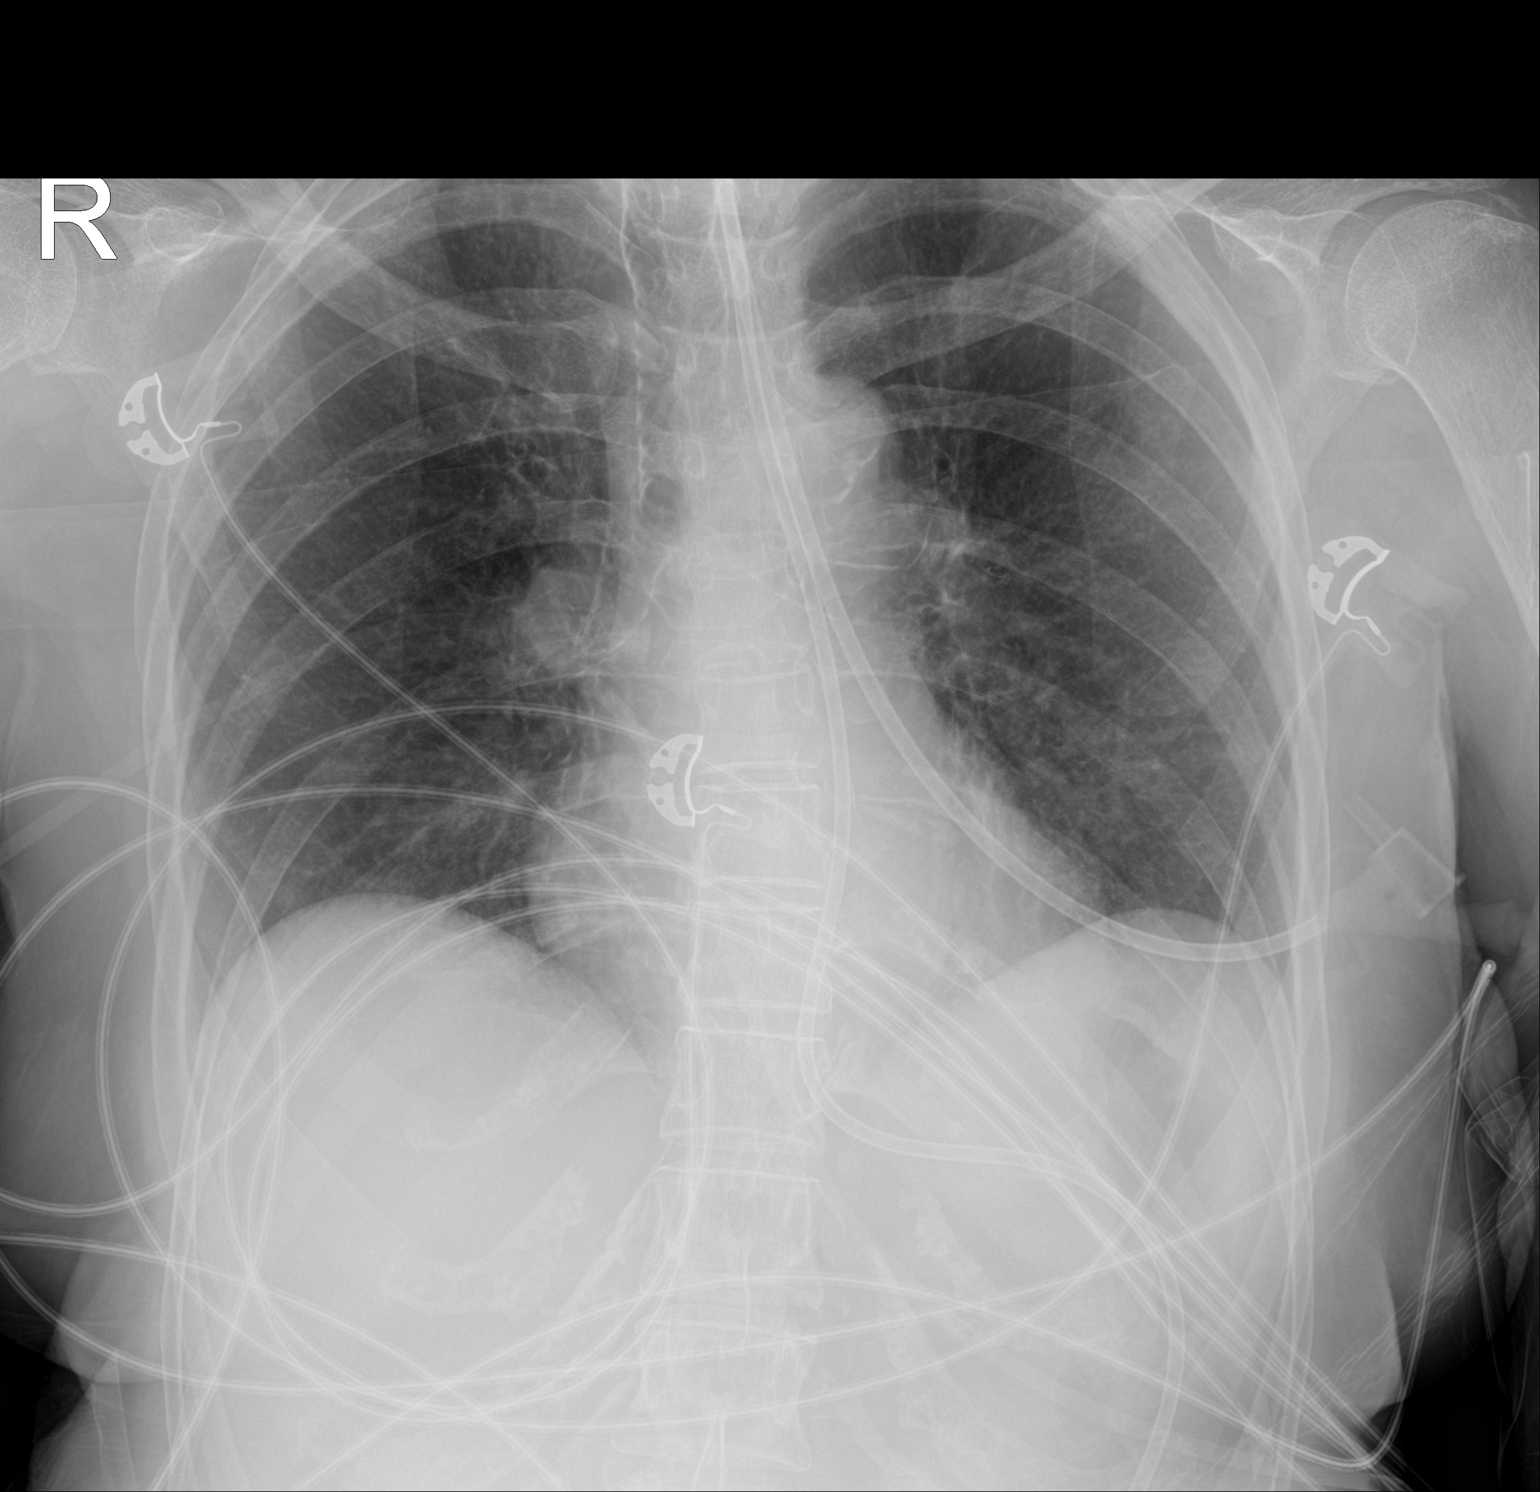

[1 of 1 positions shown; findings below may reference images not displayed]

FINDINGS: Cardiomediastinal silhouette unchanged. Compare to the prior there
is mild increase prominence of the interstitial markings with
interlobular septal thickening.

No confluent airspace disease. No pneumothorax or pleural effusion.
Persistent pleuroparenchymal scarring at the lung apices.

Enteric feeding tube terminates out of the field of view.

No displaced fracture.
IMPRESSION: Coarsened interstitial markings with mild interlobular septal
thickening may represent chronic changes, however, early pulmonary
edema not excluded.

No evidence of multifocal pneumonia.

Enteric tube terminates out of the field of view.

## 2018-01-08 IMAGING — CT CT CHEST W/ CM
2 of 5 series · 13 of 36 positions shown, 16 images · IV contrast (iopamidol)
Comparison: CT scan 01/11/2008 and abdominal ultrasound examination
06/06/2017

CLINICAL DATA: Unresolved pneumonia and abdominal infection. Fever,
chills and elevated white blood cell count.

EXAM:
CT CHEST, ABDOMEN, AND PELVIS WITH CONTRAST
TECHNIQUE: Multidetector CT imaging of the chest, abdomen and pelvis was
performed following the standard protocol during bolus
administration of intravenous contrast.
CONTRAST:  75mL 22DHG0-ATT IOPAMIDOL (22DHG0-ATT) INJECTION 61%

[Series 3: cap with 5mm st · axial · 0.98mm/px · z∈[+856,+1401]mm · 10 of 131 slices shown, 13 images]
[im 11/131  mediastinal]
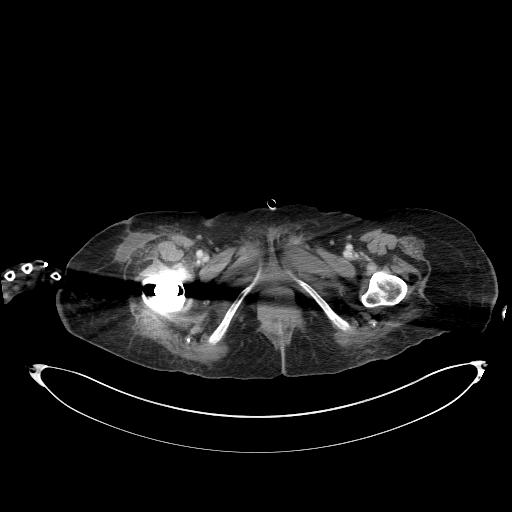
[im 11/131  lung]
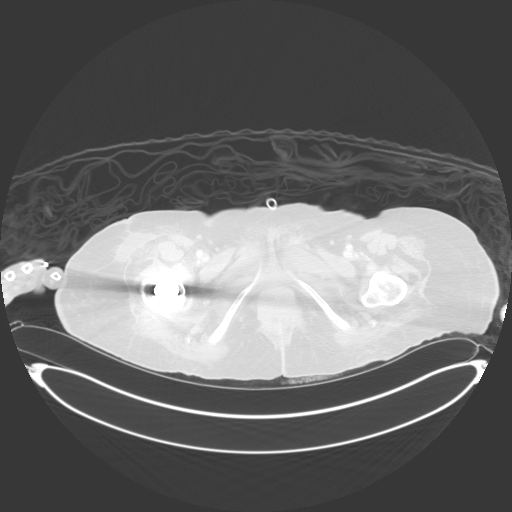
[im 22/131  lung]
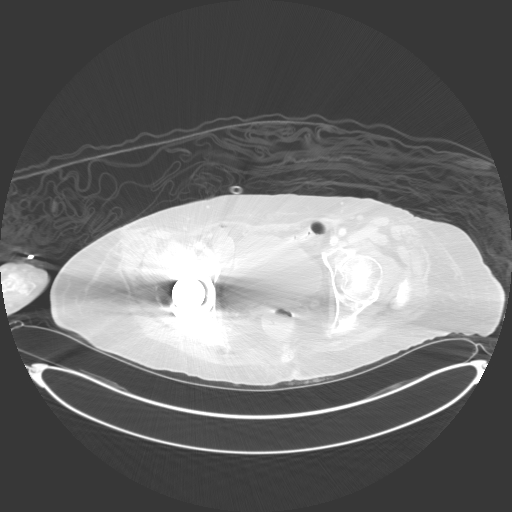
[im 33/131  lung]
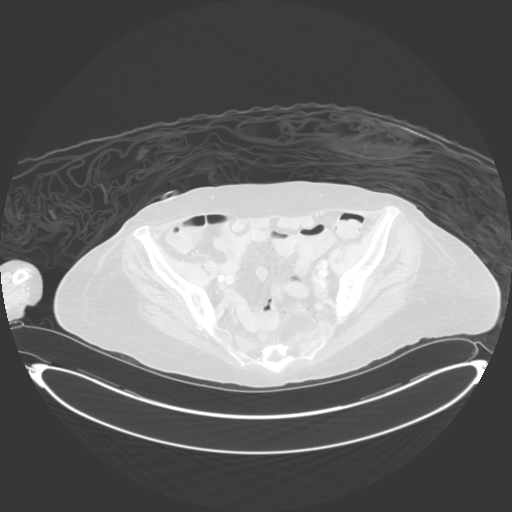
[im 44/131  lung]
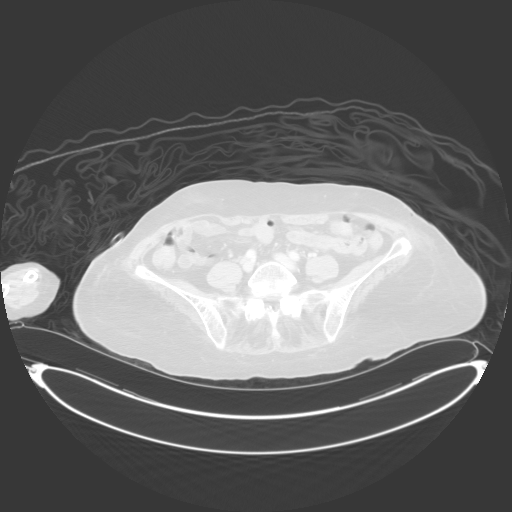
[im 55/131  mediastinal]
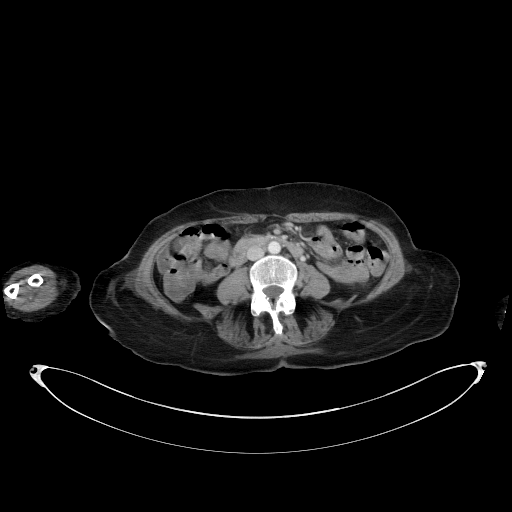
[im 55/131  lung]
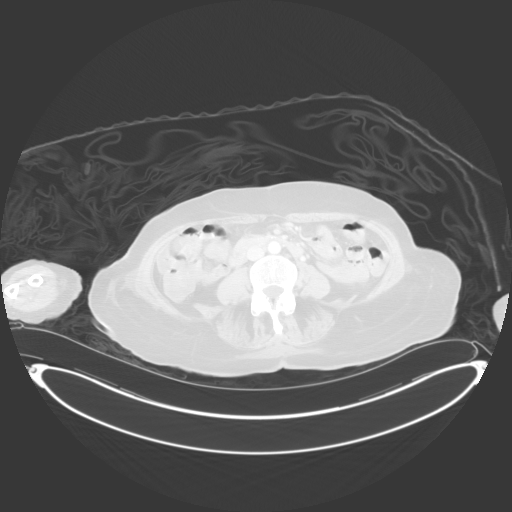
[im 76/131  lung]
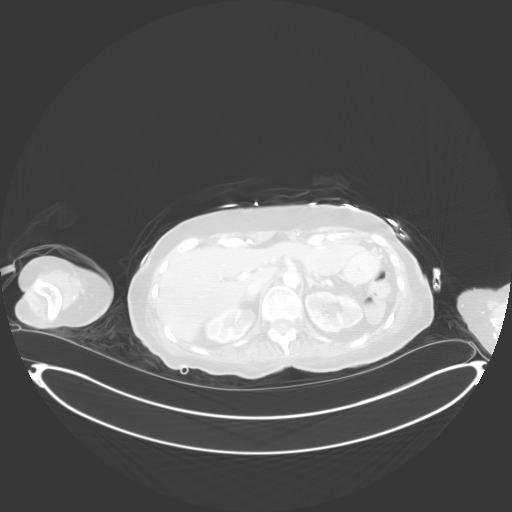
[im 87/131  lung]
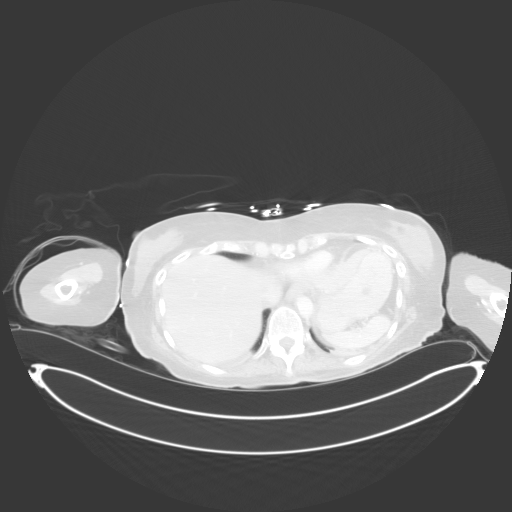
[im 98/131  lung]
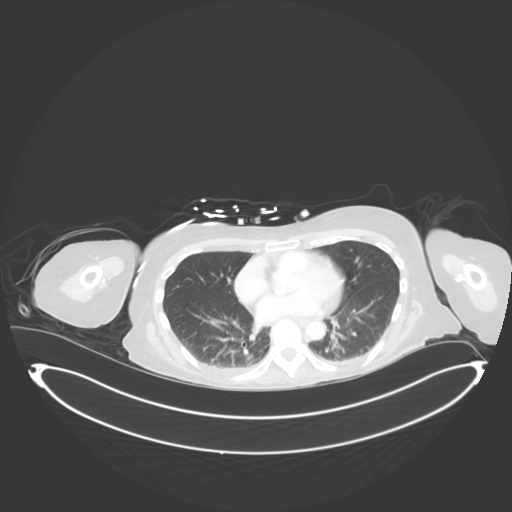
[im 109/131  mediastinal]
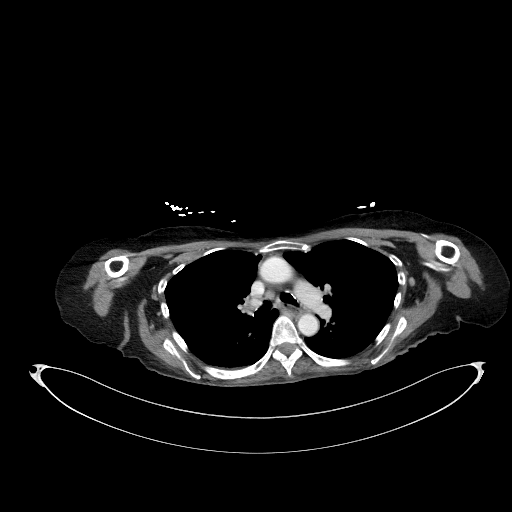
[im 109/131  lung]
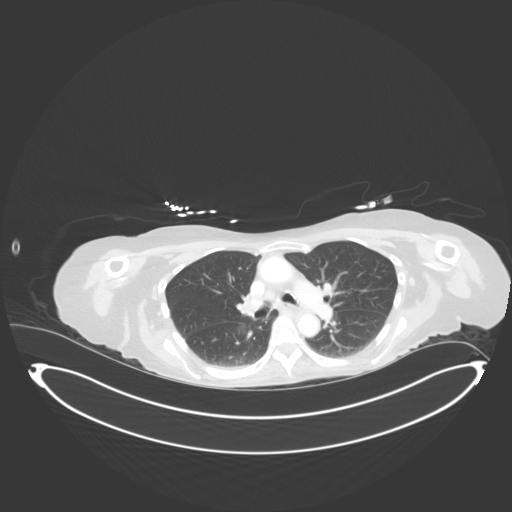
[im 120/131  lung]
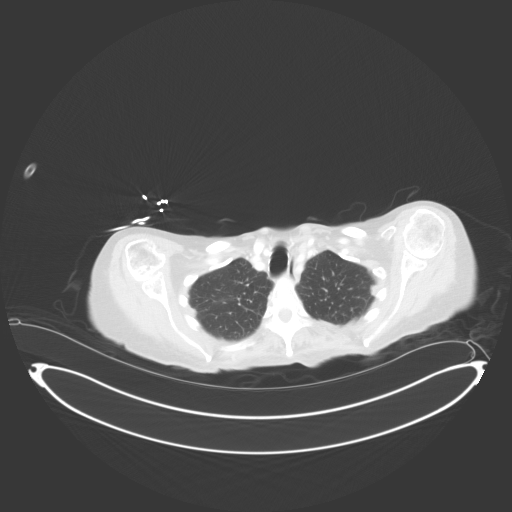

[Series 6: cap with 3mm st cor · coronal · 0.80mm/px · 3 of 111 slices shown]
[im 23/111  lung]
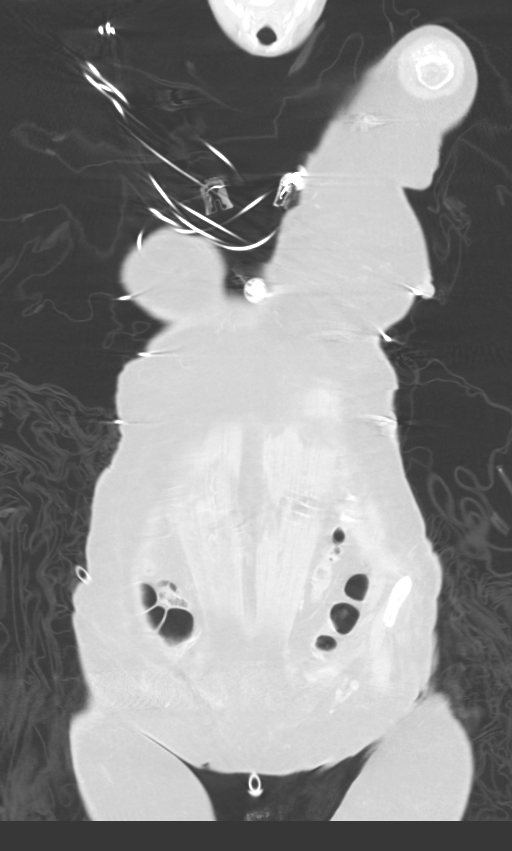
[im 45/111  lung]
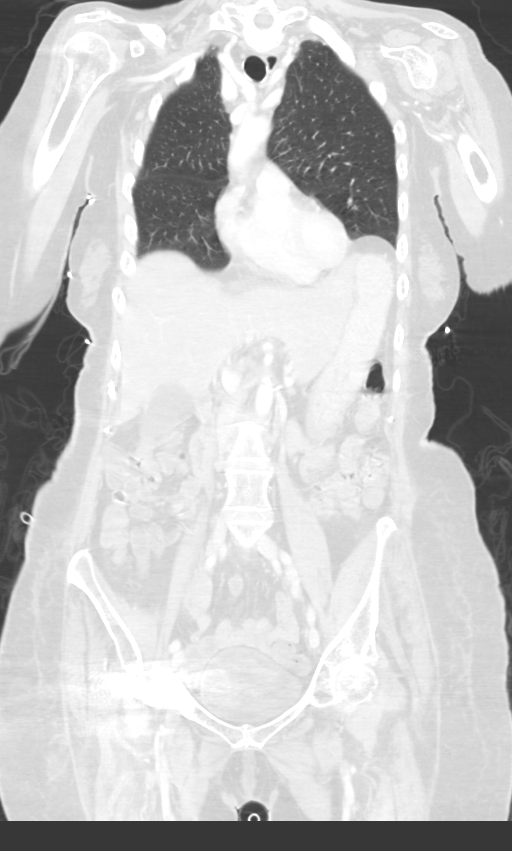
[im 67/111  lung]
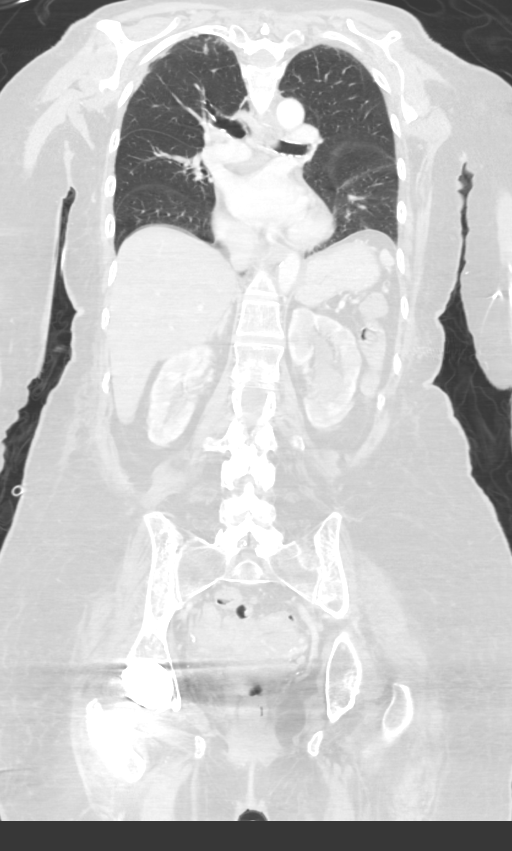

[13 of 36 positions shown; findings below may reference images not displayed]

FINDINGS: CT CHEST FINDINGS

Cardiovascular: The heart is within normal limits in size for age.
No pericardial effusion. Fluid noted in the pericardial recesses.
The aorta is normal in caliber. There is tortuosity, mild ectasia
and scattered atherosclerotic calcifications. In scattered coronary
artery calcifications are also noted.

Mediastinum/Nodes: No mass or adenopathy. The esophagus is grossly
normal.

Lungs/Pleura: Dense biapical pleural and parenchymal scarring type
changes. Patchy areas of bronchiectasis are noted. No interstitial
lung disease. Bibasilar subpleural atelectasis but no definite
infiltrates or edema. No worrisome pulmonary lesions.

Musculoskeletal: No breast masses, supraclavicular or axillary
lymphadenopathy. The thyroid gland is grossly normal.

No significant bony findings.

CT ABDOMEN PELVIS FINDINGS

Hepatobiliary: No focal hepatic lesions or intrahepatic biliary
dilatation. Gallbladder is mildly distended but no definite CT
findings for acute cholecystitis. Normal caliber common bile duct.

Pancreas: Severe pancreatic atrophy but no mass, inflammation or
ductal dilatation.

Spleen: Normal size.  No focal lesions.

Adrenals/Urinary Tract: The adrenal glands are unremarkable. Stable
large complex hyperdense/hemorrhagic cyst associated with the left
kidney. Other smaller cysts are noted bilaterally. No worrisome
renal lesions or hydronephrosis. No obvious bladder lesions.

Stomach/Bowel: The stomach, duodenum, small bowel and colon are
grossly normal without oral contrast. No inflammatory changes, mass
lesions or obstructive findings. The terminal ileum and appendix are
normal.

Vascular/Lymphatic: The aorta is normal in caliber. No dissection.
Moderate scattered atherosclerotic calcifications. The branch
vessels are patent. The major venous structures are patent. No
mesenteric or retroperitoneal mass or adenopathy. Small scattered
lymph nodes are noted.

Reproductive: Surgically absent.

Other: No pelvic mass or adenopathy. No free pelvic fluid
collections. No inguinal mass or adenopathy. No abdominal wall
hernia or subcutaneous lesions.

Musculoskeletal: There is a total right hip arthroplasty with
significant artifact. There is a elongated rim enhancing fluid
collection superficial to the muscular fascia overlying the right
hip. This measures 9.7 x 4.1 x 1.6 cm and could be an abscess.
IMPRESSION: 1. No significant acute findings in the chest, abdomen or pelvis.
2. Elongated rim enhancing fluid collection overlying the right hip
suspicious for an abscess. Recommend aspiration.
